# Patient Record
Sex: Male | Born: 1985 | Race: White | Hispanic: No | Marital: Single | Smoking: Current some day smoker
Health system: Southern US, Community
[De-identification: ages and names within clinical notes are randomized; demographics above are authoritative.]

## PROBLEM LIST (undated history)

## (undated) DIAGNOSIS — F209 Schizophrenia, unspecified: Secondary | ICD-10-CM

## (undated) DIAGNOSIS — F39 Unspecified mood [affective] disorder: Secondary | ICD-10-CM

## (undated) DIAGNOSIS — F1910 Other psychoactive substance abuse, uncomplicated: Secondary | ICD-10-CM

## (undated) DIAGNOSIS — J45909 Unspecified asthma, uncomplicated: Secondary | ICD-10-CM

## (undated) HISTORY — PX: ANKLE SURGERY: SHX546

## (undated) HISTORY — PX: JOINT REPLACEMENT: SHX530

## (undated) HISTORY — PX: FRACTURE SURGERY: SHX138

---
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
## 2006-08-09 ENCOUNTER — Emergency Department (HOSPITAL_COMMUNITY): Admission: EM | Admit: 2006-08-09 | Discharge: 2006-08-09 | Payer: Self-pay | Admitting: Emergency Medicine

## 2007-01-23 ENCOUNTER — Emergency Department (HOSPITAL_COMMUNITY): Admission: EM | Admit: 2007-01-23 | Discharge: 2007-01-23 | Payer: Self-pay | Admitting: Emergency Medicine

## 2007-01-25 ENCOUNTER — Emergency Department (HOSPITAL_COMMUNITY): Admission: EM | Admit: 2007-01-25 | Discharge: 2007-01-25 | Payer: Self-pay | Admitting: Emergency Medicine

## 2007-01-29 ENCOUNTER — Emergency Department (HOSPITAL_COMMUNITY): Admission: EM | Admit: 2007-01-29 | Discharge: 2007-01-29 | Payer: Self-pay | Admitting: Emergency Medicine

## 2007-04-11 ENCOUNTER — Emergency Department (HOSPITAL_COMMUNITY): Admission: EM | Admit: 2007-04-11 | Discharge: 2007-04-11 | Payer: Self-pay | Admitting: Emergency Medicine

## 2007-04-14 ENCOUNTER — Emergency Department (HOSPITAL_COMMUNITY): Admission: EM | Admit: 2007-04-14 | Discharge: 2007-04-14 | Payer: Self-pay | Admitting: Emergency Medicine

## 2007-05-23 ENCOUNTER — Emergency Department (HOSPITAL_COMMUNITY): Admission: EM | Admit: 2007-05-23 | Discharge: 2007-05-23 | Payer: Self-pay | Admitting: *Deleted

## 2007-05-24 ENCOUNTER — Emergency Department (HOSPITAL_COMMUNITY): Admission: EM | Admit: 2007-05-24 | Discharge: 2007-05-24 | Payer: Self-pay | Admitting: Emergency Medicine

## 2007-11-03 ENCOUNTER — Emergency Department (HOSPITAL_COMMUNITY): Admission: EM | Admit: 2007-11-03 | Discharge: 2007-11-03 | Payer: Self-pay | Admitting: Emergency Medicine

## 2010-05-03 ENCOUNTER — Emergency Department (HOSPITAL_COMMUNITY): Admission: EM | Admit: 2010-05-03 | Discharge: 2010-05-03 | Payer: Self-pay | Admitting: Emergency Medicine

## 2010-06-30 ENCOUNTER — Emergency Department (HOSPITAL_COMMUNITY): Admission: EM | Admit: 2010-06-30 | Discharge: 2010-06-30 | Payer: Self-pay | Admitting: Emergency Medicine

## 2010-12-16 ENCOUNTER — Inpatient Hospital Stay (HOSPITAL_COMMUNITY): Admission: EM | Admit: 2010-12-16 | Discharge: 2010-12-24 | Payer: Self-pay | Source: Home / Self Care

## 2010-12-19 LAB — ABO/RH: ABO/RH(D): A POS

## 2010-12-19 LAB — CBC
HCT: 43.4 % (ref 39.0–52.0)
HCT: 23.3 % — ABNORMAL LOW (ref 39.0–52.0)
HCT: 19 % — ABNORMAL LOW (ref 39.0–52.0)
HCT: 24.4 % — ABNORMAL LOW (ref 39.0–52.0)
HCT: 19.9 % — ABNORMAL LOW (ref 39.0–52.0)
Hemoglobin: 15 g/dL (ref 13.0–17.0)
Hemoglobin: 7.9 g/dL — ABNORMAL LOW (ref 13.0–17.0)
Hemoglobin: 6.4 g/dL — CL (ref 13.0–17.0)
Hemoglobin: 8.5 g/dL — ABNORMAL LOW (ref 13.0–17.0)
Hemoglobin: 6.8 g/dL — CL (ref 13.0–17.0)
MCH: 31.8 pg (ref 26.0–34.0)
MCH: 31.1 pg (ref 26.0–34.0)
MCH: 31.1 pg (ref 26.0–34.0)
MCH: 30.9 pg (ref 26.0–34.0)
MCH: 30.5 pg (ref 26.0–34.0)
MCHC: 34.6 g/dL (ref 30.0–36.0)
MCHC: 33.9 g/dL (ref 30.0–36.0)
MCHC: 33.7 g/dL (ref 30.0–36.0)
MCHC: 34.8 g/dL (ref 30.0–36.0)
MCHC: 34.2 g/dL (ref 30.0–36.0)
MCV: 91.9 fL (ref 78.0–100.0)
MCV: 91.7 fL (ref 78.0–100.0)
MCV: 92.2 fL (ref 78.0–100.0)
MCV: 88.7 fL (ref 78.0–100.0)
MCV: 89.2 fL (ref 78.0–100.0)
Platelets: 322 10*3/uL (ref 150–400)
Platelets: 252 10*3/uL (ref 150–400)
Platelets: 177 10*3/uL (ref 150–400)
Platelets: 144 10*3/uL — ABNORMAL LOW (ref 150–400)
Platelets: 135 10*3/uL — ABNORMAL LOW (ref 150–400)
RBC: 4.72 MIL/uL (ref 4.22–5.81)
RBC: 2.54 MIL/uL — ABNORMAL LOW (ref 4.22–5.81)
RBC: 2.06 MIL/uL — ABNORMAL LOW (ref 4.22–5.81)
RBC: 2.75 MIL/uL — ABNORMAL LOW (ref 4.22–5.81)
RBC: 2.23 MIL/uL — ABNORMAL LOW (ref 4.22–5.81)
RDW: 12.2 % (ref 11.5–15.5)
RDW: 12.4 % (ref 11.5–15.5)
RDW: 12.3 % (ref 11.5–15.5)
RDW: 13.7 % (ref 11.5–15.5)
RDW: 13.8 % (ref 11.5–15.5)
WBC: 13.9 10*3/uL — ABNORMAL HIGH (ref 4.0–10.5)
WBC: 15.8 10*3/uL — ABNORMAL HIGH (ref 4.0–10.5)
WBC: 15.2 10*3/uL — ABNORMAL HIGH (ref 4.0–10.5)
WBC: 15 10*3/uL — ABNORMAL HIGH (ref 4.0–10.5)
WBC: 12.7 10*3/uL — ABNORMAL HIGH (ref 4.0–10.5)

## 2010-12-19 LAB — COMPREHENSIVE METABOLIC PANEL
ALT: 259 U/L — ABNORMAL HIGH (ref 0–53)
ALT: 70 U/L — ABNORMAL HIGH (ref 0–53)
AST: 231 U/L — ABNORMAL HIGH (ref 0–37)
AST: 60 U/L — ABNORMAL HIGH (ref 0–37)
Albumin: 4.3 g/dL (ref 3.5–5.2)
Albumin: 2.6 g/dL — ABNORMAL LOW (ref 3.5–5.2)
Alkaline Phosphatase: 102 U/L (ref 39–117)
Alkaline Phosphatase: 60 U/L (ref 39–117)
BUN: 12 mg/dL (ref 6–23)
BUN: 3 mg/dL — ABNORMAL LOW (ref 6–23)
CO2: 22 mEq/L (ref 19–32)
CO2: 27 mEq/L (ref 19–32)
Calcium: 9.1 mg/dL (ref 8.4–10.5)
Calcium: 7.6 mg/dL — ABNORMAL LOW (ref 8.4–10.5)
Chloride: 104 mEq/L (ref 96–112)
Chloride: 100 mEq/L (ref 96–112)
Creatinine, Ser: 0.93 mg/dL (ref 0.4–1.5)
Creatinine, Ser: 0.67 mg/dL (ref 0.4–1.5)
GFR calc Af Amer: >60 mL/min (ref >60)
GFR calc Af Amer: >60 mL/min (ref >60)
GFR calc non Af Amer: >60 mL/min (ref >60)
GFR calc non Af Amer: >60 mL/min (ref >60)
Glucose, Bld: 126 mg/dL — ABNORMAL HIGH (ref 70–99)
Glucose, Bld: 111 mg/dL — ABNORMAL HIGH (ref 70–99)
Potassium: 4.2 mEq/L (ref 3.5–5.1)
Potassium: 3.7 mEq/L (ref 3.5–5.1)
Sodium: 136 mEq/L (ref 135–145)
Sodium: 132 mEq/L — ABNORMAL LOW (ref 135–145)
Total Bilirubin: 0.7 mg/dL (ref 0.3–1.2)
Total Bilirubin: 1.7 mg/dL — ABNORMAL HIGH (ref 0.3–1.2)
Total Protein: 7.1 g/dL (ref 6.0–8.3)
Total Protein: 4.6 g/dL — ABNORMAL LOW (ref 6.0–8.3)

## 2010-12-19 LAB — GLUCOSE, CAPILLARY
Glucose-Capillary: 148 mg/dL — ABNORMAL HIGH (ref 70–99)
Glucose-Capillary: 170 mg/dL — ABNORMAL HIGH (ref 70–99)
Glucose-Capillary: 124 mg/dL — ABNORMAL HIGH (ref 70–99)
Glucose-Capillary: 119 mg/dL — ABNORMAL HIGH (ref 70–99)
Glucose-Capillary: 121 mg/dL — ABNORMAL HIGH (ref 70–99)
Glucose-Capillary: 119 mg/dL — ABNORMAL HIGH (ref 70–99)
Glucose-Capillary: 125 mg/dL — ABNORMAL HIGH (ref 70–99)
Glucose-Capillary: 122 mg/dL — ABNORMAL HIGH (ref 70–99)
Glucose-Capillary: 112 mg/dL — ABNORMAL HIGH (ref 70–99)

## 2010-12-19 LAB — TYPE AND SCREEN
ABO/RH(D): A POS
Antibody Screen: NEGATIVE
Unit division: 0
Unit division: 0

## 2010-12-19 LAB — BASIC METABOLIC PANEL
BUN: 7 mg/dL (ref 6–23)
CO2: 22 mEq/L (ref 19–32)
Calcium: 7.4 mg/dL — ABNORMAL LOW (ref 8.4–10.5)
Chloride: 104 mEq/L (ref 96–112)
Creatinine, Ser: 0.87 mg/dL (ref 0.4–1.5)
GFR calc Af Amer: >60 mL/min (ref >60)
GFR calc non Af Amer: >60 mL/min (ref >60)
Glucose, Bld: 154 mg/dL — ABNORMAL HIGH (ref 70–99)
Potassium: 4 mEq/L (ref 3.5–5.1)
Sodium: 134 mEq/L — ABNORMAL LOW (ref 135–145)

## 2010-12-19 LAB — DIFFERENTIAL
Basophils Absolute: 0 10*3/uL (ref 0.0–0.1)
Basophils Relative: 0 % (ref 0–1)
Eosinophils Absolute: 0.2 10*3/uL (ref 0.0–0.7)
Eosinophils Relative: 1 % (ref 0–5)
Lymphocytes Relative: 26 % (ref 12–46)
Lymphs Abs: 3.6 10*3/uL (ref 0.7–4.0)
Monocytes Absolute: 0.7 10*3/uL (ref 0.1–1.0)
Monocytes Relative: 5 % (ref 3–12)
Neutro Abs: 9.4 10*3/uL — ABNORMAL HIGH (ref 1.7–7.7)
Neutrophils Relative %: 68 % (ref 43–77)

## 2010-12-19 LAB — HEMOGLOBIN AND HEMATOCRIT, BLOOD
HCT: 25.9 % — ABNORMAL LOW (ref 39.0–52.0)
Hemoglobin: 8.6 g/dL — ABNORMAL LOW (ref 13.0–17.0)

## 2010-12-19 LAB — ETHANOL: Alcohol, Ethyl (B): 5 mg/dL (ref 0–10)

## 2010-12-19 LAB — POCT I-STAT 4, (NA,K, GLUC, HGB,HCT)
Glucose, Bld: 126 mg/dL — ABNORMAL HIGH (ref 70–99)
HCT: 29 % — ABNORMAL LOW (ref 39.0–52.0)
Hemoglobin: 9.9 g/dL — ABNORMAL LOW (ref 13.0–17.0)
Potassium: 4.6 mEq/L (ref 3.5–5.1)
Sodium: 138 mEq/L (ref 135–145)

## 2010-12-19 LAB — PREPARE RBC (CROSSMATCH)

## 2010-12-19 LAB — RAPID URINE DRUG SCREEN, HOSP PERFORMED
Amphetamines: NOT DETECTED
Barbiturates: NOT DETECTED
Benzodiazepines: POSITIVE — AB
Cocaine: NOT DETECTED
Opiates: POSITIVE — AB
Tetrahydrocannabinol: POSITIVE — AB

## 2010-12-19 LAB — MRSA PCR SCREENING: MRSA by PCR: NEGATIVE

## 2010-12-21 LAB — CBC
HCT: 24.7 % — ABNORMAL LOW (ref 39.0–52.0)
HCT: 23.4 % — ABNORMAL LOW (ref 39.0–52.0)
HCT: 22.4 % — ABNORMAL LOW (ref 39.0–52.0)
Hemoglobin: 8.7 g/dL — ABNORMAL LOW (ref 13.0–17.0)
Hemoglobin: 8.2 g/dL — ABNORMAL LOW (ref 13.0–17.0)
Hemoglobin: 7.7 g/dL — ABNORMAL LOW (ref 13.0–17.0)
MCH: 30.9 pg (ref 26.0–34.0)
MCH: 30.6 pg (ref 26.0–34.0)
MCH: 30.7 pg (ref 26.0–34.0)
MCHC: 35.2 g/dL (ref 30.0–36.0)
MCHC: 35 g/dL (ref 30.0–36.0)
MCHC: 34.4 g/dL (ref 30.0–36.0)
MCV: 87.6 fL (ref 78.0–100.0)
MCV: 87.3 fL (ref 78.0–100.0)
MCV: 89.2 fL (ref 78.0–100.0)
Platelets: 123 10*3/uL — ABNORMAL LOW (ref 150–400)
Platelets: 121 10*3/uL — ABNORMAL LOW (ref 150–400)
Platelets: 153 10*3/uL (ref 150–400)
RBC: 2.82 MIL/uL — ABNORMAL LOW (ref 4.22–5.81)
RBC: 2.68 MIL/uL — ABNORMAL LOW (ref 4.22–5.81)
RBC: 2.51 MIL/uL — ABNORMAL LOW (ref 4.22–5.81)
RDW: 13.7 % (ref 11.5–15.5)
RDW: 13.6 % (ref 11.5–15.5)
RDW: 13.6 % (ref 11.5–15.5)
WBC: 12.2 10*3/uL — ABNORMAL HIGH (ref 4.0–10.5)
WBC: 9.5 10*3/uL (ref 4.0–10.5)
WBC: 8.3 10*3/uL (ref 4.0–10.5)

## 2010-12-21 LAB — GLUCOSE, CAPILLARY
Glucose-Capillary: 134 mg/dL — ABNORMAL HIGH (ref 70–99)
Glucose-Capillary: 125 mg/dL — ABNORMAL HIGH (ref 70–99)
Glucose-Capillary: 108 mg/dL — ABNORMAL HIGH (ref 70–99)
Glucose-Capillary: 122 mg/dL — ABNORMAL HIGH (ref 70–99)
Glucose-Capillary: 125 mg/dL — ABNORMAL HIGH (ref 70–99)
Glucose-Capillary: 103 mg/dL — ABNORMAL HIGH (ref 70–99)

## 2010-12-21 LAB — COMPREHENSIVE METABOLIC PANEL
ALT: 36 U/L (ref 0–53)
AST: 43 U/L — ABNORMAL HIGH (ref 0–37)
Albumin: 2.2 g/dL — ABNORMAL LOW (ref 3.5–5.2)
Alkaline Phosphatase: 67 U/L (ref 39–117)
BUN: 2 mg/dL — ABNORMAL LOW (ref 6–23)
CO2: 27 mEq/L (ref 19–32)
Calcium: 7.5 mg/dL — ABNORMAL LOW (ref 8.4–10.5)
Chloride: 105 mEq/L (ref 96–112)
Creatinine, Ser: 0.61 mg/dL (ref 0.4–1.5)
GFR calc Af Amer: >60 mL/min (ref >60)
GFR calc non Af Amer: >60 mL/min (ref >60)
Glucose, Bld: 115 mg/dL — ABNORMAL HIGH (ref 70–99)
Potassium: 3.3 mEq/L — ABNORMAL LOW (ref 3.5–5.1)
Sodium: 137 mEq/L (ref 135–145)
Total Bilirubin: 1.1 mg/dL (ref 0.3–1.2)
Total Protein: 4.9 g/dL — ABNORMAL LOW (ref 6.0–8.3)

## 2010-12-21 LAB — BASIC METABOLIC PANEL
BUN: 2 mg/dL — ABNORMAL LOW (ref 6–23)
CO2: 27 mEq/L (ref 19–32)
Calcium: 7.6 mg/dL — ABNORMAL LOW (ref 8.4–10.5)
Chloride: 98 mEq/L (ref 96–112)
Creatinine, Ser: 0.64 mg/dL (ref 0.4–1.5)
GFR calc Af Amer: >60 mL/min (ref >60)
GFR calc non Af Amer: >60 mL/min (ref >60)
Glucose, Bld: 129 mg/dL — ABNORMAL HIGH (ref 70–99)
Potassium: 3 mEq/L — ABNORMAL LOW (ref 3.5–5.1)
Sodium: 133 mEq/L — ABNORMAL LOW (ref 135–145)

## 2010-12-21 LAB — PROTIME-INR
INR: 1.24 (ref 0.00–1.49)
Prothrombin Time: 15.8 seconds — ABNORMAL HIGH (ref 11.6–15.2)
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
## 2010-12-26 LAB — BASIC METABOLIC PANEL
BUN: 4 mg/dL — ABNORMAL LOW (ref 6–23)
CO2: 26 mEq/L (ref 19–32)
Calcium: 8.5 mg/dL (ref 8.4–10.5)
GFR calc Af Amer: >60 mL/min (ref >60)
GFR calc non Af Amer: >60 mL/min (ref >60)
GFR calc non Af Amer: >60 mL/min (ref >60)
Glucose, Bld: 119 mg/dL — ABNORMAL HIGH (ref 70–99)
Glucose, Bld: 120 mg/dL — ABNORMAL HIGH (ref 70–99)
Potassium: 3.4 mEq/L — ABNORMAL LOW (ref 3.5–5.1)
Sodium: 139 mEq/L (ref 135–145)
Sodium: 136 mEq/L (ref 135–145)

## 2010-12-26 LAB — TYPE AND SCREEN
ABO/RH(D): A POS
Antibody Screen: NEGATIVE
Unit division: 0
Unit division: 0
Unit division: 0
Unit division: 0

## 2010-12-26 LAB — CBC
HCT: 25.4 % — ABNORMAL LOW (ref 39.0–52.0)
HCT: 27 % — ABNORMAL LOW (ref 39.0–52.0)
HCT: 28 % — ABNORMAL LOW (ref 39.0–52.0)
Hemoglobin: 9 g/dL — ABNORMAL LOW (ref 13.0–17.0)
Hemoglobin: 9.4 g/dL — ABNORMAL LOW (ref 13.0–17.0)
MCH: 30.1 pg (ref 26.0–34.0)
MCHC: 33.9 g/dL (ref 30.0–36.0)
MCHC: 33.3 g/dL (ref 30.0–36.0)
MCHC: 33.6 g/dL (ref 30.0–36.0)
MCV: 88.5 fL (ref 78.0–100.0)
MCV: 89.1 fL (ref 78.0–100.0)
Platelets: 185 10*3/uL (ref 150–400)
RDW: 13.6 % (ref 11.5–15.5)
RDW: 13.1 % (ref 11.5–15.5)
RDW: 12.9 % (ref 11.5–15.5)

## 2010-12-26 LAB — TISSUE CULTURE: Culture: NO GROWTH

## 2010-12-26 LAB — GLUCOSE, CAPILLARY: Glucose-Capillary: 110 mg/dL — ABNORMAL HIGH (ref 70–99)

## 2010-12-27 LAB — CBC
HCT: 29.6 % — ABNORMAL LOW (ref 39.0–52.0)
Hemoglobin: 9.7 g/dL — ABNORMAL LOW (ref 13.0–17.0)
MCH: 29.3 pg (ref 26.0–34.0)
MCHC: 32.8 g/dL (ref 30.0–36.0)
RDW: 12.7 % (ref 11.5–15.5)

## 2010-12-27 LAB — ANAEROBIC CULTURE
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
## 2011-02-10 ENCOUNTER — Ambulatory Visit (HOSPITAL_COMMUNITY)
Admission: RE | Admit: 2011-02-10 | Discharge: 2011-02-10 | Disposition: A | Discharge status: Home / Self Care | Payer: Medicaid Other | Source: Ambulatory Visit | Attending: Orthopedic Surgery | Admitting: Orthopedic Surgery

## 2011-02-10 ENCOUNTER — Ambulatory Visit (HOSPITAL_COMMUNITY): Payer: Medicaid Other

## 2011-02-10 DIAGNOSIS — F319 Bipolar disorder, unspecified: Secondary | ICD-10-CM | POA: Insufficient documentation

## 2011-02-10 DIAGNOSIS — IMO0002 Reserved for concepts with insufficient information to code with codable children: Secondary | ICD-10-CM | POA: Insufficient documentation

## 2011-02-10 LAB — SURGICAL PCR SCREEN
MRSA, PCR: NEGATIVE
Staphylococcus aureus: NEGATIVE

## 2011-02-10 LAB — TYPE AND SCREEN
ABO/RH(D): A POS
Antibody Screen: NEGATIVE

## 2011-02-10 LAB — URINALYSIS, ROUTINE W REFLEX MICROSCOPIC
Bilirubin Urine: NEGATIVE
Ketones, ur: NEGATIVE mg/dL
Nitrite: NEGATIVE
Protein, ur: NEGATIVE mg/dL

## 2011-02-10 LAB — COMPREHENSIVE METABOLIC PANEL
AST: 19 U/L (ref 0–37)
Alkaline Phosphatase: 153 U/L — ABNORMAL HIGH (ref 39–117)
BUN: 10 mg/dL (ref 6–23)
CO2: 26 mEq/L (ref 19–32)
Chloride: 102 mEq/L (ref 96–112)
Creatinine, Ser: 0.61 mg/dL (ref 0.4–1.5)
GFR calc non Af Amer: >60 mL/min (ref >60)
Potassium: 3.3 mEq/L — ABNORMAL LOW (ref 3.5–5.1)
Total Bilirubin: 0.2 mg/dL — ABNORMAL LOW (ref 0.3–1.2)

## 2011-02-10 LAB — DIFFERENTIAL
Basophils Relative: 0 % (ref 0–1)
Eosinophils Absolute: 0.2 10*3/uL (ref 0.0–0.7)
Eosinophils Relative: 2 % (ref 0–5)
Monocytes Absolute: 0.6 10*3/uL (ref 0.1–1.0)
Monocytes Relative: 7 % (ref 3–12)
Neutrophils Relative %: 68 % (ref 43–77)

## 2011-02-10 LAB — CBC
MCH: 28.7 pg (ref 26.0–34.0)
MCHC: 33.3 g/dL (ref 30.0–36.0)
Platelets: 321 10*3/uL (ref 150–400)
RDW: 12.4 % (ref 11.5–15.5)

## 2011-02-11 LAB — URINE CULTURE: Culture  Setup Time: 201203091009

## 2011-02-16 NOTE — Op Note (Signed)
NAME:  Jason Buchanan, Jason Buchanan               ACCOUNT NO.:  [id number]  MEDICAL RECORD NO.:  [id number]           PATIENT TYPE:  O  LOCATION:  SDSC                         FACILITY:  [facility]  PHYSICIAN:  Doralee Albino. Carola Frost, M.D. DATE OF BIRTH:  October 23, 1986  DATE OF PROCEDURE:  02/10/2011 DATE OF DISCHARGE:  02/10/2011                              OPERATIVE REPORT   PREOPERATIVE DIAGNOSES: 1. Right tibia nonunion. 2. Retained external fixator.  POSTOPERATIVE DIAGNOSES: 1. Right tibia nonunion. 2. Retained external fixator.  PROCEDURES: 1. Removal of right ankle spanning external fixator under anesthesia. 2. Curettage and debridement of partial excision of pin tracks, right     tibia and calcaneus. 3. Stress fluoroscopy.  SURGEON:  Yurem Viner. Carola Frost, MD  ASSISTANT:  Mearl Latin, PA  ANESTHESIA:  General.  COMPLICATIONS:  None.  TOURNIQUET:  None.  ESTIMATED BLOOD LOSS:  30 mL.  DISPOSITION:  To PACU.  CONDITION:  Stable.  BRIEF SUMMARY AND INDICATION FOR PROCEDURE:  [name] is a 25 year old male status post open right distal tibia fracture as well as an ipsilateral bicondylar tibial plateau fracture which was treated with ORIF.  The patient has been in a fixator for an extended period of time awaiting soft tissue swelling resolution as well as in the hope this could provide definitive fixation and go on to unite.  The patient now presents for elective removal with stress fluoro, examination, and curettage of his pin sites in case subsequent plating is required.  I discussed with him the risks and benefits of surgery including possibility of infection, nerve injury, vessel injury, need for further surgery, DVT, PE, heart attack, stroke, and multiple others.  After full discussion, the patient wished to proceed.  BRIEF DESCRIPTION OF PROCEDURE:  Mr. Goynes was administered preop antibiotics and taken to the operating room where general anesthesia was induced to his right  lower extremity.  He then underwent removal of the spanning fixator.  There was some subcutaneous and a full-thickness skin necrotic tissue that required debridement adjacent to the pins.  The calcaneal pin was somewhat loose, but not excessively so.  Tibial pins were well fixed.  Following removal, the standard prep and drape was performed of the right lower extremity and then aggressive curettage of the pin sites performed debriding bone along the tract of the pin in the medullary canal touching the far cortex, the near cortex, subcu tissue, and skin.  C-arm was then brought in the ankle stress demonstrating some slight motion at the fracture site consistent with nonunion.  Sterile gently compressive dressing was applied.  The patient was then wakened from anesthesia and transported to the PACU in stable condition.  Montez Morita, PA-C, assisted me throughout the procedure.  He was placed in a posterior and stirrup splint in addition a sterile dressing.  He will go to CT Scans from the PACU to help [place] quantify and better delineate the extent of his nonunion and for operative planning.  I would anticipate him returning to the OR after his pin sites have healed for plating and grafting.     Doralee Albino. Carola Frost, M.D.  MHH/MEDQ  D:  02/10/2011  T:  02/11/2011  Job:  [id number]  Electronically Signed by Myrene Galas M.D. on 02/16/2011 10:25:08 AM

## 2011-03-06 ENCOUNTER — Emergency Department (HOSPITAL_COMMUNITY): Payer: Medicaid Other

## 2011-03-06 ENCOUNTER — Emergency Department (HOSPITAL_COMMUNITY)
Admission: EM | Admit: 2011-03-06 | Discharge: 2011-03-06 | Disposition: A | Discharge status: Home / Self Care | Payer: Medicaid Other | Attending: Emergency Medicine | Admitting: Emergency Medicine

## 2011-03-06 DIAGNOSIS — G8918 Other acute postprocedural pain: Secondary | ICD-10-CM | POA: Insufficient documentation

## 2011-03-06 DIAGNOSIS — M79609 Pain in unspecified limb: Secondary | ICD-10-CM | POA: Insufficient documentation

## 2011-03-06 DIAGNOSIS — Z79899 Other long term (current) drug therapy: Secondary | ICD-10-CM | POA: Insufficient documentation

## 2011-03-06 DIAGNOSIS — J45909 Unspecified asthma, uncomplicated: Secondary | ICD-10-CM | POA: Insufficient documentation

## 2011-03-06 DIAGNOSIS — M25569 Pain in unspecified knee: Secondary | ICD-10-CM | POA: Insufficient documentation

## 2011-03-06 DIAGNOSIS — Z7901 Long term (current) use of anticoagulants: Secondary | ICD-10-CM | POA: Insufficient documentation

## 2011-03-08 ENCOUNTER — Other Ambulatory Visit (HOSPITAL_COMMUNITY): Payer: Self-pay | Admitting: Orthopedic Surgery

## 2011-03-08 ENCOUNTER — Ambulatory Visit (HOSPITAL_COMMUNITY)
Admission: RE | Admit: 2011-03-08 | Discharge: 2011-03-08 | Disposition: A | Discharge status: Home / Self Care | Payer: Medicaid Other | Source: Ambulatory Visit | Attending: Orthopedic Surgery | Admitting: Orthopedic Surgery

## 2011-03-08 ENCOUNTER — Encounter (HOSPITAL_COMMUNITY)
Admission: RE | Admit: 2011-03-08 | Discharge: 2011-03-08 | Disposition: A | Discharge status: Home / Self Care | Payer: Medicaid Other | Source: Ambulatory Visit | Attending: Orthopedic Surgery | Admitting: Orthopedic Surgery

## 2011-03-08 DIAGNOSIS — S8291XA Unspecified fracture of right lower leg, initial encounter for closed fracture: Secondary | ICD-10-CM

## 2011-03-08 DIAGNOSIS — Z01812 Encounter for preprocedural laboratory examination: Secondary | ICD-10-CM | POA: Insufficient documentation

## 2011-03-08 DIAGNOSIS — Z01818 Encounter for other preprocedural examination: Secondary | ICD-10-CM | POA: Insufficient documentation

## 2011-03-08 DIAGNOSIS — X58XXXA Exposure to other specified factors, initial encounter: Secondary | ICD-10-CM | POA: Insufficient documentation

## 2011-03-08 DIAGNOSIS — Z01810 Encounter for preprocedural cardiovascular examination: Secondary | ICD-10-CM | POA: Insufficient documentation

## 2011-03-08 DIAGNOSIS — S82109A Unspecified fracture of upper end of unspecified tibia, initial encounter for closed fracture: Secondary | ICD-10-CM | POA: Insufficient documentation

## 2011-03-08 LAB — PROTIME-INR
INR: 1.01 (ref 0.00–1.49)
Prothrombin Time: 13.5 seconds (ref 11.6–15.2)

## 2011-03-08 LAB — SURGICAL PCR SCREEN: Staphylococcus aureus: NEGATIVE

## 2011-03-08 LAB — GAMMA GT: GGT: 41 U/L (ref 7–51)

## 2011-03-08 LAB — URINALYSIS, ROUTINE W REFLEX MICROSCOPIC
Hgb urine dipstick: NEGATIVE
Specific Gravity, Urine: 1.021 (ref 1.005–1.030)
Urobilinogen, UA: 0.2 mg/dL (ref 0.0–1.0)
pH: 7.5 (ref 5.0–8.0)

## 2011-03-08 LAB — CBC
HCT: 42.8 % (ref 39.0–52.0)
Hemoglobin: 14.8 g/dL (ref 13.0–17.0)
RBC: 5.07 MIL/uL (ref 4.22–5.81)
RDW: 13.2 % (ref 11.5–15.5)
WBC: 7.7 10*3/uL (ref 4.0–10.5)

## 2011-03-08 LAB — COMPREHENSIVE METABOLIC PANEL
ALT: 22 U/L (ref 0–53)
BUN: 9 mg/dL (ref 6–23)
Calcium: 10.3 mg/dL (ref 8.4–10.5)
Glucose, Bld: 105 mg/dL — ABNORMAL HIGH (ref 70–99)
Sodium: 138 mEq/L (ref 135–145)
Total Protein: 7.5 g/dL (ref 6.0–8.3)

## 2011-03-08 LAB — SEDIMENTATION RATE: Sed Rate: 0 mm/hr (ref 0–16)

## 2011-03-09 ENCOUNTER — Inpatient Hospital Stay (HOSPITAL_COMMUNITY)
Admission: RE | Admit: 2011-03-09 | Discharge: 2011-03-10 | DRG: 494 | Disposition: A | Discharge status: Home / Self Care | Payer: Medicaid Other | Source: Ambulatory Visit | Attending: Orthopedic Surgery | Admitting: Orthopedic Surgery

## 2011-03-09 ENCOUNTER — Inpatient Hospital Stay (HOSPITAL_COMMUNITY): Payer: Medicaid Other

## 2011-03-09 DIAGNOSIS — M4120 Other idiopathic scoliosis, site unspecified: Secondary | ICD-10-CM | POA: Diagnosis present

## 2011-03-09 DIAGNOSIS — J45909 Unspecified asthma, uncomplicated: Secondary | ICD-10-CM | POA: Diagnosis present

## 2011-03-09 DIAGNOSIS — F17200 Nicotine dependence, unspecified, uncomplicated: Secondary | ICD-10-CM | POA: Diagnosis present

## 2011-03-09 DIAGNOSIS — F319 Bipolar disorder, unspecified: Secondary | ICD-10-CM | POA: Diagnosis present

## 2011-03-09 DIAGNOSIS — IMO0002 Reserved for concepts with insufficient information to code with codable children: Principal | ICD-10-CM | POA: Diagnosis present

## 2011-03-09 LAB — URINE CULTURE
Colony Count: NO GROWTH
Culture  Setup Time: 201204041622

## 2011-03-09 LAB — C-REACTIVE PROTEIN: CRP: 0.1 mg/dL — ABNORMAL LOW (ref <0.6)

## 2011-03-10 ENCOUNTER — Inpatient Hospital Stay (HOSPITAL_COMMUNITY): Payer: Medicaid Other

## 2011-03-12 LAB — TISSUE CULTURE

## 2011-03-12 NOTE — Op Note (Signed)
NAME:  Jason Buchanan, Jason Buchanan               ACCOUNT NO.:  [id number]  MEDICAL RECORD NO.:  [id number]           PATIENT TYPE:  I  LOCATION:  5034                         FACILITY:  [facility]  PHYSICIAN:  Doralee Albino. Carola Frost, M.D. DATE OF BIRTH:  Dec 23, 1985  DATE OF PROCEDURE:  03/09/2011 DATE OF DISCHARGE:                              OPERATIVE REPORT   PREOPERATIVE DIAGNOSIS:  Right tibia nonunion.  POSTOPERATIVE DIAGNOSIS:  Right tibia nonunion.  PROCEDURE: 1. Open reduction and internal fixation and repair of right tibial     nonunion. 2. Infuse allografting. 3. Removal of deep implant, right tibia.  SURGEON:  Nolin Grell. Carola Frost, MD  ASSISTANT:  Mearl Latin, PA  ANESTHESIA:  General.  COMPLICATIONS:  None.  BLOOD LOSS:  20 mL.  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  SPECIMENS:  One fibrous nonunion.  DISPOSITION:  To micro.  TOURNIQUET:  None.  DISPOSITION:  To PACU.  CONDITION:  Stable.  BRIEF SUMMARY AND INDICATION FOR PROCEDURE:  Jason Buchanan is a 25 year old male status post open right tibial pilon fracture with multiple other injuries.  The patient was treated for a prolonged period with external fixator and lag screw fixation at the articular level appearing to unite the articular fractures, but to maintain a symptomatic large metaphyseal nonunion.  There was a cortical defect posteriorly.  I discussed with him the risks and benefits of posterior approach and repair and allografting including the possibility of infection, nerve injury, vessel injury, persistent nonunion, symptomatic hardware, need for further surgery, DVT, PE, heart attack, stroke, and multiple others. After full discussion, he did wished to proceed.  BRIEF DESCRIPTION OF PROCEDURE:  Jason Buchanan was given preoperative antibiotics and taken to the operating room where general anesthesia was induced.  His right lower extremity was prepped and draped in  usual sterile fashion and then as the patient was prone with all prominences padded appropriately, a 9-cm incision was made just lateral to the calcaneus and then dissection carried down where the lesser saphenous nerve and vessels were identified and retracted for protection.  The fascia was incised and the Achilles retracted medially.  The deep fascia was then incised as well.  Dissection carried carefully down medial to the FHL and directly onto the bone.  Anastomoses including the peroneal and posterior tibial systems was cauterized and divided but it was quite small.  Dissection continued subperiosteally along the posterior tibia and the margins of the metaphyseal defect were identified and then excised in bulk using a 10 blade which produced a 3 cm x 2 cm x 2.5 cm deep fibrous nonunion.  This was sent to micro and cultures and there would be no organisms found.  The margins of the defect were curetted out, irrigation and lavage were performed.  The defect was then filled with a combination of infuse about 60 mL of allograft chips and with multiple interspersed layers of infuse and then infuse over the periosteal layer.  I did leave the periosteum intact along the articular segment distal to the metaphyseal defect.  The posterior tibial  plate was then placed provisionally with K-wires.  I did feel like that one of the screws was prominent anteriorly and so the knee was flexed.  A small incision made and an anterior-posterior lag screw removed through a separate anterolateral incision carefully looking through the superficial peroneal nerve and retracting.  The plate was then secured with standard fixation into the articular block as well as the shaft and this was followed by additional standard screws proximally.  Because of the patient's profound osteopenia, I did place locked fixation in the subchondral segment.  Final x-rays showed appropriate reduction, filling of the void and  screw trajectory length.  Wound was irrigated and closed in standard layered fashion with 2-0 Vicryl and 3-0 nylon.  Sterile gently compressive dressing was applied and a posterior stirrup splint. The patient was wakened from anesthesia and transported to the PACU in stable condition.  Montez Morita, PA-C assisted me throughout the procedure and was absolutely necessary for safe and effective completion  of the case to protect the posterior tibial neurovascular bundles, peroneal artery and to retract the deep soft tissues and muscle layers.  He also assisted me with placement and removal of provisional fixation.  PROGNOSIS:  Jason Buchanan will be nonweightbearing on the right lower extremity for the next 6 weeks with graduated weightbearing thereafter. We will follow the cultures to make sure there is no occult infection and he will have unrestricted range of motion as soon as soft tissues allow him.  He will be on DVT prophylaxis Lovenox while in the hospital. May be an increased risk for persistent nonunion, but the vascularity appeared excellent within the bone.     Doralee Albino. Carola Frost, M.D.     MHH/MEDQ  D:  03/09/2011  T:  03/10/2011  Job:  [id number]  Electronically Signed by Myrene Galas M.D. on 03/12/2011 03:12:19 PM

## 2011-03-14 LAB — ANAEROBIC CULTURE
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
## 2011-05-14 ENCOUNTER — Emergency Department (HOSPITAL_COMMUNITY)
Admission: EM | Admit: 2011-05-14 | Discharge: 2011-05-14 | Disposition: A | Discharge status: Home / Self Care | Payer: Medicaid Other | Attending: Emergency Medicine | Admitting: Emergency Medicine

## 2011-05-14 ENCOUNTER — Emergency Department (HOSPITAL_COMMUNITY): Payer: Medicaid Other

## 2011-05-14 DIAGNOSIS — F411 Generalized anxiety disorder: Secondary | ICD-10-CM | POA: Insufficient documentation

## 2011-05-14 DIAGNOSIS — T22119A Burn of first degree of unspecified forearm, initial encounter: Secondary | ICD-10-CM | POA: Insufficient documentation

## 2011-05-14 DIAGNOSIS — S7000XA Contusion of unspecified hip, initial encounter: Secondary | ICD-10-CM | POA: Insufficient documentation

## 2011-05-14 DIAGNOSIS — Y929 Unspecified place or not applicable: Secondary | ICD-10-CM | POA: Insufficient documentation

## 2011-05-14 DIAGNOSIS — X118XXA Contact with other hot tap-water, initial encounter: Secondary | ICD-10-CM | POA: Insufficient documentation

## 2011-05-14 DIAGNOSIS — IMO0002 Reserved for concepts with insufficient information to code with codable children: Secondary | ICD-10-CM | POA: Insufficient documentation

## 2011-05-14 DIAGNOSIS — R296 Repeated falls: Secondary | ICD-10-CM | POA: Insufficient documentation

## 2011-08-19 ENCOUNTER — Emergency Department (HOSPITAL_COMMUNITY)
Admission: EM | Admit: 2011-08-19 | Discharge: 2011-08-19 | Disposition: A | Discharge status: Home / Self Care | Payer: Medicaid Other | Attending: Emergency Medicine | Admitting: Emergency Medicine

## 2011-08-19 ENCOUNTER — Emergency Department (HOSPITAL_COMMUNITY): Payer: Medicaid Other

## 2011-08-19 DIAGNOSIS — W108XXA Fall (on) (from) other stairs and steps, initial encounter: Secondary | ICD-10-CM | POA: Insufficient documentation

## 2011-08-19 DIAGNOSIS — M949 Disorder of cartilage, unspecified: Secondary | ICD-10-CM | POA: Insufficient documentation

## 2011-08-19 DIAGNOSIS — M25579 Pain in unspecified ankle and joints of unspecified foot: Secondary | ICD-10-CM | POA: Insufficient documentation

## 2011-08-19 DIAGNOSIS — S93409A Sprain of unspecified ligament of unspecified ankle, initial encounter: Secondary | ICD-10-CM | POA: Insufficient documentation

## 2011-08-19 DIAGNOSIS — M899 Disorder of bone, unspecified: Secondary | ICD-10-CM | POA: Insufficient documentation
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
## 2012-08-08 ENCOUNTER — Encounter (HOSPITAL_COMMUNITY): Payer: Self-pay

## 2012-08-08 ENCOUNTER — Emergency Department (HOSPITAL_COMMUNITY)
Admission: EM | Admit: 2012-08-08 | Discharge: 2012-08-08 | Disposition: A | Discharge status: Home / Self Care | Payer: Medicaid Other | Attending: Emergency Medicine | Admitting: Emergency Medicine

## 2012-08-08 DIAGNOSIS — R21 Rash and other nonspecific skin eruption: Secondary | ICD-10-CM

## 2012-08-08 DIAGNOSIS — F17200 Nicotine dependence, unspecified, uncomplicated: Secondary | ICD-10-CM | POA: Insufficient documentation

## 2012-08-08 MED ORDER — PREDNISONE 50 MG PO TABS: ORAL_TABLET | ORAL | Status: DC

## 2012-08-08 MED ORDER — LORAZEPAM 2 MG/ML IJ SOLN
1.0000 mg | Freq: Once | INTRAMUSCULAR | Status: AC
  Administered 2012-08-08: 1 mg via INTRAVENOUS
  Filled 2012-08-08: qty 1

## 2012-08-08 MED ORDER — FAMOTIDINE IN NACL 20-0.9 MG/50ML-% IV SOLN
20.0000 mg | Freq: Once | INTRAVENOUS | Status: AC
  Administered 2012-08-08: 20 mg via INTRAVENOUS
  Filled 2012-08-08: qty 50

## 2012-08-08 MED ORDER — HYDROXYZINE HCL 25 MG PO TABS
25.0000 mg | ORAL_TABLET | Freq: Once | ORAL | Status: AC
  Administered 2012-08-08: 25 mg via ORAL
  Filled 2012-08-08: qty 1

## 2012-08-08 MED ORDER — DIPHENHYDRAMINE HCL 50 MG/ML IJ SOLN
25.0000 mg | Freq: Once | INTRAMUSCULAR | Status: AC
  Administered 2012-08-08: 25 mg via INTRAVENOUS
  Filled 2012-08-08: qty 1

## 2012-08-08 MED ORDER — METHYLPREDNISOLONE SODIUM SUCC 125 MG IJ SOLR
125.0000 mg | Freq: Once | INTRAMUSCULAR | Status: AC
  Administered 2012-08-08: 125 mg via INTRAVENOUS
  Filled 2012-08-08: qty 2

## 2012-08-08 MED ORDER — HYDROMORPHONE HCL PF 1 MG/ML IJ SOLN
1.0000 mg | Freq: Once | INTRAMUSCULAR | Status: AC
  Administered 2012-08-08: 1 mg via INTRAVENOUS
  Filled 2012-08-08: qty 1

## 2012-08-08 MED ORDER — DIPHENHYDRAMINE HCL 25 MG PO TABS: 25.0000 mg | ORAL_TABLET | Freq: Four times a day (QID) | ORAL | Status: DC | PRN

## 2012-08-08 NOTE — ED Notes (Signed)
Pt was working in the yard on Tuesday and breaking into a rash red raised itchy on arms and legs
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
## 2012-08-08 NOTE — ED Provider Notes (Signed)
Medical screening examination/treatment/procedure(s) were conducted as a shared visit with non-physician practitioner(s) and myself.  I personally evaluated the patient during the encounter On my exam this male is in no distress with no respiratory compromise, nor evidence of systemic infection.  Gerhard Munch, MD 08/08/12 [phone]

## 2012-08-12 ENCOUNTER — Emergency Department (HOSPITAL_COMMUNITY)
Admission: EM | Admit: 2012-08-12 | Discharge: 2012-08-12 | Disposition: A | Discharge status: Home / Self Care | Payer: Medicaid Other | Attending: Emergency Medicine | Admitting: Emergency Medicine

## 2012-08-12 ENCOUNTER — Emergency Department (HOSPITAL_COMMUNITY): Payer: Medicaid Other

## 2012-08-12 ENCOUNTER — Encounter (HOSPITAL_COMMUNITY): Payer: Self-pay | Admitting: Family Medicine

## 2012-08-12 DIAGNOSIS — R079 Chest pain, unspecified: Secondary | ICD-10-CM | POA: Insufficient documentation

## 2012-08-12 DIAGNOSIS — W11XXXA Fall on and from ladder, initial encounter: Secondary | ICD-10-CM | POA: Insufficient documentation

## 2012-08-12 DIAGNOSIS — F17200 Nicotine dependence, unspecified, uncomplicated: Secondary | ICD-10-CM | POA: Insufficient documentation

## 2012-08-12 DIAGNOSIS — M79609 Pain in unspecified limb: Secondary | ICD-10-CM | POA: Insufficient documentation

## 2012-08-12 DIAGNOSIS — S0100XA Unspecified open wound of scalp, initial encounter: Secondary | ICD-10-CM | POA: Insufficient documentation

## 2012-08-12 DIAGNOSIS — R51 Headache: Secondary | ICD-10-CM | POA: Insufficient documentation

## 2012-08-12 DIAGNOSIS — S0990XA Unspecified injury of head, initial encounter: Secondary | ICD-10-CM

## 2012-08-12 DIAGNOSIS — IMO0002 Reserved for concepts with insufficient information to code with codable children: Secondary | ICD-10-CM | POA: Insufficient documentation

## 2012-08-12 MED ORDER — IBUPROFEN 800 MG PO TABS: ORAL_TABLET | ORAL | Status: DC

## 2012-08-12 MED ORDER — IBUPROFEN 800 MG PO TABS
800.0000 mg | ORAL_TABLET | Freq: Once | ORAL | Status: AC
  Administered 2012-08-12: 800 mg via ORAL
  Filled 2012-08-12: qty 1

## 2012-08-12 NOTE — ED Provider Notes (Signed)
History     CSN: [id number]  Arrival date & time 08/12/12  1617   First MD Initiated Contact with Patient 08/12/12 1654      Chief Complaint  Patient presents with  . Fall  . Head Injury    (Consider location/radiation/quality/duration/timing/severity/associated sxs/prior treatment) Patient is a 26 y.o. male presenting with head injury. The history is provided by the patient (pt fell off a ladder and hit his head.   no loc). No language interpreter was used.  Head Injury  The incident occurred 6 to 12 hours ago. He came to the ER via walk-in. The injury mechanism was a direct blow. There was no loss of consciousness. The volume of blood lost was moderate. The quality of the pain is described as dull. The pain is at a severity of 6/10. The pain is moderate. The pain has been constant since the injury. Pertinent negatives include no numbness. He was found conscious by EMS personnel. Treatment prior to arrival: nothing. The treatment provided moderate relief.    History reviewed. No pertinent past medical history.  Past Surgical History  Procedure Date  . Joint replacement     History reviewed. No pertinent family history.  History  Substance Use Topics  . Smoking status: Current Everyday Smoker -- 0.5 packs/day  . Smokeless tobacco: Not on file  . Alcohol Use: Yes     occasionally      Review of Systems  Constitutional: Negative for fatigue.  HENT: Negative for congestion, sinus pressure and ear discharge.   Eyes: Negative for discharge.  Respiratory: Negative for cough.   Cardiovascular: Negative for chest pain.  Gastrointestinal: Negative for abdominal pain and diarrhea.  Genitourinary: Negative for frequency and hematuria.  Musculoskeletal: Negative for back pain.  Skin: Negative for rash.  Neurological: Positive for headaches. Negative for seizures and numbness.  Hematological: Negative.   Psychiatric/Behavioral: Negative for hallucinations.    Allergies    Review of patient's allergies indicates no known allergies.  Home Medications   Current Outpatient Rx  Name Route Sig Dispense Refill  . IBUPROFEN 200 MG PO TABS Oral Take 400 mg by mouth every 8 (eight) hours as needed. For pain.    . IBUPROFEN 800 MG PO TABS  Take one pill every 8 hours as needed for pain 21 tablet 0    BP 140/81  Pulse 105  Temp 97.5 F (36.4 C) (Oral)  Resp 18  SpO2 97%  Physical Exam  Constitutional: He is oriented to person, place, and time. He appears well-developed.  HENT:  Head: Normocephalic.       4 cm lac to scalp  Eyes: Conjunctivae and EOM are normal. No scleral icterus.  Neck: Neck supple. No thyromegaly present.  Cardiovascular: Normal rate and regular rhythm.  Exam reveals no gallop and no friction rub.   No murmur heard. Pulmonary/Chest: No stridor. He has no wheezes. He has no rales. He exhibits no tenderness.  Abdominal: He exhibits no distension. There is no tenderness. There is no rebound.  Musculoskeletal: Normal range of motion. He exhibits no edema.       Abrasion to hand with old lac.  Abrasion to forearm  Lymphadenopathy:    He has no cervical adenopathy.  Neurological: He is oriented to person, place, and time. Coordination normal.  Skin: No rash noted. No erythema.  Psychiatric: He has a normal mood and affect. His behavior is normal.    ED Course  LACERATION REPAIR Performed by: Dajsha Massaro L Authorized  by: Eon Zunker L Comments: Pt has a 4 cm lac to top of scalp and the lac was closed with staples   (including critical care time)  Labs Reviewed - No data to display Dg Chest 2 View  08/12/2012  *RADIOLOGY REPORT*  Clinical Data: Chest pain.  Fell.  Smoker.  CHEST - 2 VIEW  Comparison: 03/08/2011.  Findings: The heart remains normal in size and the lungs are clear. The lungs remain mildly hyperexpanded with mild diffuse prominence of the interstitial markings.  Mild thoracic spine degenerative changes and mild  scoliosis.  No fracture pneumothorax seen.  IMPRESSION: Stable mild changes of COPD.  No acute abnormality.   Original Report Authenticated By: Darrol Angel, M.D.    Dg Forearm Right  08/12/2012  *RADIOLOGY REPORT*  Clinical Data: Right forearm pain following a fall.  RIGHT FOREARM - 2 VIEW  Comparison: Right wrist dated 05/23/2007 and right hand obtained today.  Findings: Normal appearing bones and soft tissues without fracture or dislocation.  IMPRESSION: Normal examination.   Original Report Authenticated By: Darrol Angel, M.D.    Ct Head Wo Contrast  08/12/2012  *RADIOLOGY REPORT*  Clinical Data:  Fall.  Head injury.  CT HEAD WITHOUT CONTRAST CT CERVICAL SPINE WITHOUT CONTRAST  Technique:  Multidetector CT imaging of the head and cervical spine was performed following the standard protocol without intravenous contrast.  Multiplanar CT image reconstructions of the cervical spine were also generated.  Comparison:  12/16/2010  CT HEAD  Findings: There is no evidence for acute hemorrhage, hydrocephalus, mass lesion, or abnormal extra-axial fluid collection.  No definite CT evidence for acute infarction.  No air-fluid levels in the paranasal sinuses.  Mild polypoid mucosal disease in the left maxillary sinus is stable.  IMPRESSION: Stable exam.  Normal CT of the brain.  CT CERVICAL SPINE  Findings: Imaging was obtained from the skull base through the T1-2 interspace.  No evidence for fracture.  No subluxation.  The intervertebral disc spaces are preserved.  Facets are well-aligned bilaterally.  There is no prevertebral soft tissue swelling. Normal cervical lordosis is preserved.  IMPRESSION: No evidence for cervical spine fracture.   Original Report Authenticated By: ERIC A. MANSELL, M.D.    Ct Cervical Spine Wo Contrast  08/12/2012  *RADIOLOGY REPORT*  Clinical Data:  Fall.  Head injury.  CT HEAD WITHOUT CONTRAST CT CERVICAL SPINE WITHOUT CONTRAST  Technique:  Multidetector CT imaging of the head and  cervical spine was performed following the standard protocol without intravenous contrast.  Multiplanar CT image reconstructions of the cervical spine were also generated.  Comparison:  12/16/2010  CT HEAD  Findings: There is no evidence for acute hemorrhage, hydrocephalus, mass lesion, or abnormal extra-axial fluid collection.  No definite CT evidence for acute infarction.  No air-fluid levels in the paranasal sinuses.  Mild polypoid mucosal disease in the left maxillary sinus is stable.  IMPRESSION: Stable exam.  Normal CT of the brain.  CT CERVICAL SPINE  Findings: Imaging was obtained from the skull base through the T1-2 interspace.  No evidence for fracture.  No subluxation.  The intervertebral disc spaces are preserved.  Facets are well-aligned bilaterally.  There is no prevertebral soft tissue swelling. Normal cervical lordosis is preserved.  IMPRESSION: No evidence for cervical spine fracture.   Original Report Authenticated By: ERIC A. MANSELL, M.D.    Dg Hand Complete Right  08/12/2012  *RADIOLOGY REPORT*  Clinical Data: Right hand pain following a fall.  RIGHT HAND -  COMPLETE 3+ VIEW  Comparison: Right thumb dated 06/22/2010 and right wrist dated 05/23/2007.  Findings: Normal appearing bones and soft tissues without fracture or dislocation.  IMPRESSION: Normal examination.   Original Report Authenticated By: Darrol Angel, M.D.      No diagnosis found.    MDM          Benny Lennert, MD 08/12/12 [phone]

## 2012-08-12 NOTE — ED Notes (Signed)
Pt reports he fell off a ladder around 09:00 this morning about 8 ft onto gravel surface. Reports hitting head on ladder on the way down.  Laceration to right hand from fall. Large laceration to top of head, bruising to right eye. Reports dizziness, blurred vision, loss of appetite, nausea, but no vomiting.

## 2012-08-12 NOTE — ED Notes (Signed)
c-collar placed in triage

## 2012-08-12 NOTE — ED Notes (Signed)
Pt verbalizes understanding 

## 2013-02-10 ENCOUNTER — Emergency Department (HOSPITAL_COMMUNITY): Payer: Medicaid Other

## 2013-02-10 ENCOUNTER — Emergency Department (HOSPITAL_COMMUNITY)
Admission: EM | Admit: 2013-02-10 | Discharge: 2013-02-10 | Disposition: A | Discharge status: Home / Self Care | Payer: Medicaid Other | Attending: Emergency Medicine | Admitting: Emergency Medicine

## 2013-02-10 ENCOUNTER — Encounter (HOSPITAL_COMMUNITY): Payer: Self-pay | Admitting: *Deleted

## 2013-02-10 DIAGNOSIS — J45909 Unspecified asthma, uncomplicated: Secondary | ICD-10-CM | POA: Insufficient documentation

## 2013-02-10 DIAGNOSIS — F17200 Nicotine dependence, unspecified, uncomplicated: Secondary | ICD-10-CM | POA: Insufficient documentation

## 2013-02-10 DIAGNOSIS — R21 Rash and other nonspecific skin eruption: Secondary | ICD-10-CM | POA: Insufficient documentation

## 2013-02-10 DIAGNOSIS — S93401A Sprain of unspecified ligament of right ankle, initial encounter: Secondary | ICD-10-CM

## 2013-02-10 DIAGNOSIS — Y929 Unspecified place or not applicable: Secondary | ICD-10-CM | POA: Insufficient documentation

## 2013-02-10 DIAGNOSIS — Y939 Activity, unspecified: Secondary | ICD-10-CM | POA: Insufficient documentation

## 2013-02-10 DIAGNOSIS — S8990XA Unspecified injury of unspecified lower leg, initial encounter: Secondary | ICD-10-CM | POA: Insufficient documentation

## 2013-02-10 DIAGNOSIS — W010XXA Fall on same level from slipping, tripping and stumbling without subsequent striking against object, initial encounter: Secondary | ICD-10-CM | POA: Insufficient documentation

## 2013-02-10 HISTORY — DX: Unspecified asthma, uncomplicated: J45.909

## 2013-02-10 MED ORDER — PERMETHRIN 5 % EX CREA: TOPICAL_CREAM | CUTANEOUS | Status: DC

## 2013-02-10 MED ORDER — HYDROXYZINE HCL 25 MG PO TABS: 25.0000 mg | ORAL_TABLET | Freq: Four times a day (QID) | ORAL | Status: DC

## 2013-02-10 MED FILL — Permethrin Liq Spray 0.25%: Qty: 147.86 | Status: AC

## 2013-02-10 NOTE — Progress Notes (Signed)
CM signing off medication assistance completed

## 2013-02-10 NOTE — ED Notes (Signed)
Pt reports slipping on stairs due to ice on Saturday, injuring right ankle. Pt also reports rash/itching all over body for 2 months.

## 2013-02-10 NOTE — Progress Notes (Signed)
[facility] ED Cm consulted to assist self pay pt with [facility] MATCH program for permethrine CM spoke with CM leadership CM completed MATCH letter and provided to pt  CM spoke with pt who confirms self pay [facility] resident with no pcp. CM discussed and provided written information for self pay pcps, importance of pcp for f/u care, www.needymeds.org, discounted pharmacies, and other [place] resources such as financial assistance, DSS and  health department Reviewed Health connect number to assist with finding self pay provider close to pt's residence. Reviewed resources for [facility], general medical clinics, [facility] out patient [facility], housing, affordable care act/health reform (deadline 03/03/13) and other resources in [employer]. Pt voiced understanding and appreciation of resources provided

## 2013-02-10 NOTE — ED Provider Notes (Signed)
History    This chart was scribed for non-physician practitioner working with Lyanne Co, MD by ED Scribe, Burman Nieves. This patient was seen in room WTR8/WTR8 and the patient's care was started at 3:52 PM.  CSN: [id number]  Arrival date & time 02/10/13  1522   First MD Initiated Contact with Patient 02/10/13 1552      Chief Complaint  Patient presents with  . Ankle Injury    right  . Ankle Pain    right  . Rash    (Consider location/radiation/quality/duration/timing/severity/associated sxs/prior treatment) Patient is a 27 y.o. male presenting with lower extremity injury, ankle pain, and rash. The history is provided by the patient. No language interpreter was used.  Ankle Injury  Ankle Pain Rash Associated symptoms: joint pain   Ankle Injury Associated symptoms include arthralgias and a rash.   Jason Buchanan is a 27 y.o. male who presents to the Emergency Department complaining of moderate constant right ankle pain onset Saturday. Pt reports that he was outside when he slipped and fell on some ice injuring his right ankle. Pain associated with mild swelling.  Pt states that he has taken ibuprofen with no immediate relief. He has used crutches to alleviate the pain and swelling. He has had a previous surgery of the right ankle.  Pt also complains of itchy rash all over body with an onset about a month ago. He states he may have acquired rash staying at a hotel. He complains of moderate constant itching all over his body. Girlfriend with similar rash.  He has tried using anti-itch cream and Hydrocortisone without relief.  Pt denies trouble breathing or swallowing. Pt denies head trauma, LOC, fever, chills, cough, nausea, vomiting, diarrhea, SOB, weakness, and any other associated symptoms.    Past Medical History  Diagnosis Date  . Asthma     Past Surgical History  Procedure Laterality Date  . Joint replacement    . Fracture surgery      History reviewed. No pertinent  family history.  History  Substance Use Topics  . Smoking status: Current Every Day Smoker -- 0.50 packs/day  . Smokeless tobacco: Never Used  . Alcohol Use: Yes     Comment: occasionally      Review of Systems  Musculoskeletal: Positive for arthralgias.       Right ankle pain  Skin: Positive for rash.  All other systems reviewed and are negative.    Allergies  Review of patient's allergies indicates no known allergies.  Home Medications   Current Outpatient Rx  Name  Route  Sig  Dispense  Refill  . ibuprofen (ADVIL,MOTRIN) 200 MG tablet   Oral   Take 400 mg by mouth every 8 (eight) hours as needed. For pain.         Marland Kitchen ibuprofen (ADVIL,MOTRIN) 800 MG tablet      Take one pill every 8 hours as needed for pain   21 tablet   0     BP 131/73  Pulse 95  Temp(Src) 98 F (36.7 C) (Oral)  Resp 20  Wt 135 lb (61.236 kg)  SpO2 99%  Physical Exam  Nursing note and vitals reviewed. Constitutional: He appears well-developed and well-nourished. No distress.  HENT:  Head: Normocephalic and atraumatic.  Mouth/Throat: Oropharynx is clear and moist.  Eyes: EOM are normal. Pupils are equal, round, and reactive to light.  Neck: Normal range of motion. Neck supple.  Cardiovascular: Normal rate, regular rhythm, normal heart sounds and intact  distal pulses.   Pulmonary/Chest: Effort normal and breath sounds normal. No respiratory distress. He has no wheezes.  Musculoskeletal: Normal range of motion.       Right ankle: He exhibits no swelling, no ecchymosis, no deformity, no laceration and normal pulse. Tenderness. Lateral malleolus tenderness found.  Pain over the right lateral malleolus and no tenderness over medial malleolus. Good dorsal pedis pulse in the right foot. Good sensation in distal toes.     Neurological: He is alert.  Skin: Skin is warm and dry. Rash noted. He is not diaphoretic. There is erythema.  Erythematous papular rash all over his  legs, back, abdomen,  genitals, legs, feet, dorsal aspect of both knees, and webspaces of the fingers  Psychiatric: He has a normal mood and affect. His behavior is normal.    ED Course  Procedures (including critical care time) DIAGNOSTIC STUDIES: Oxygen Saturation is 99% on room air, normal by my interpretation.    COORDINATION OF CARE: 5:34 PM Discussed ED treatment with pt and pt agrees.  6:15 PM  Pt unable to get Permethrin medication due to financial issue. Consulted Case Management.  Case management was able to provide discount prescription to pt.                                                                                                                                                                                                                                                                                                                                                                Labs Reviewed - No data to display Dg Ankle Complete Right  02/10/2013  *RADIOLOGY REPORT*  Clinical Data: Right ankle pain.  Fell 2 days ago.  Previous fractures and surgery.  RIGHT ANKLE - COMPLETE 3+ VIEW  Comparison: Right lower  leg dated 08/19/2011.  Findings: Stable hardware fixation of the distal tibia and fibula with old, healed fractures.  Stable bone fragment distal to the lateral malleolus and lateral to the talus.  No acute fractures or dislocation.  Stable screw holes in the tibia and talus.  IMPRESSION: No acute abnormality.   Original Report Authenticated By: Beckie Salts, M.D.      No diagnosis found.    MDM  Patient presenting with right ankle pain that occurred after falling 2 days ago.  No acute findings on xray.  Patient neurovascularly intact.  Patient given ankle ASO and instructed to follow up with Orthopedics if pain persists.  Patient has crutches at home.  Patient also with rash consistent with Scabies in appearance and distribution.  Case management consulted to help patient afford Permethrin  Cream.    I personally performed the services described in this documentation, which was scribed in my presence. The recorded information has been reviewed and is accurate.     Pascal Lux Hoback, PA-C 02/10/13 [phone]

## 2013-02-11 NOTE — ED Provider Notes (Signed)
Medical screening examination/treatment/procedure(s) were performed by non-physician practitioner and as supervising physician I was immediately available for consultation/collaboration.   Lyanne Co, MD 02/11/13 0100

## 2013-06-04 ENCOUNTER — Emergency Department (HOSPITAL_COMMUNITY): Payer: Medicaid Other

## 2013-06-04 ENCOUNTER — Encounter (HOSPITAL_COMMUNITY): Payer: Self-pay | Admitting: Emergency Medicine

## 2013-06-04 ENCOUNTER — Emergency Department (HOSPITAL_COMMUNITY)
Admission: EM | Admit: 2013-06-04 | Discharge: 2013-06-04 | Disposition: A | Discharge status: Home / Self Care | Payer: Medicaid Other | Attending: Emergency Medicine | Admitting: Emergency Medicine

## 2013-06-04 DIAGNOSIS — S20412A Abrasion of left back wall of thorax, initial encounter: Secondary | ICD-10-CM

## 2013-06-04 DIAGNOSIS — S1091XA Abrasion of unspecified part of neck, initial encounter: Secondary | ICD-10-CM

## 2013-06-04 DIAGNOSIS — S0093XA Contusion of unspecified part of head, initial encounter: Secondary | ICD-10-CM

## 2013-06-04 DIAGNOSIS — Z79899 Other long term (current) drug therapy: Secondary | ICD-10-CM | POA: Insufficient documentation

## 2013-06-04 DIAGNOSIS — R1110 Vomiting, unspecified: Secondary | ICD-10-CM | POA: Insufficient documentation

## 2013-06-04 DIAGNOSIS — T71191A Asphyxiation due to mechanical threat to breathing due to other causes, accidental, initial encounter: Secondary | ICD-10-CM | POA: Insufficient documentation

## 2013-06-04 DIAGNOSIS — J45909 Unspecified asthma, uncomplicated: Secondary | ICD-10-CM | POA: Insufficient documentation

## 2013-06-04 DIAGNOSIS — IMO0002 Reserved for concepts with insufficient information to code with codable children: Secondary | ICD-10-CM | POA: Insufficient documentation

## 2013-06-04 DIAGNOSIS — R0602 Shortness of breath: Secondary | ICD-10-CM | POA: Insufficient documentation

## 2013-06-04 DIAGNOSIS — S060X9A Concussion with loss of consciousness of unspecified duration, initial encounter: Secondary | ICD-10-CM | POA: Insufficient documentation

## 2013-06-04 DIAGNOSIS — S93402A Sprain of unspecified ligament of left ankle, initial encounter: Secondary | ICD-10-CM

## 2013-06-04 DIAGNOSIS — S53401A Unspecified sprain of right elbow, initial encounter: Secondary | ICD-10-CM

## 2013-06-04 DIAGNOSIS — F17200 Nicotine dependence, unspecified, uncomplicated: Secondary | ICD-10-CM | POA: Insufficient documentation

## 2013-06-04 DIAGNOSIS — S93409A Sprain of unspecified ligament of unspecified ankle, initial encounter: Secondary | ICD-10-CM | POA: Insufficient documentation

## 2013-06-04 DIAGNOSIS — R05 Cough: Secondary | ICD-10-CM | POA: Insufficient documentation

## 2013-06-04 DIAGNOSIS — R42 Dizziness and giddiness: Secondary | ICD-10-CM | POA: Insufficient documentation

## 2013-06-04 DIAGNOSIS — R4020 Unspecified coma: Secondary | ICD-10-CM

## 2013-06-04 DIAGNOSIS — R059 Cough, unspecified: Secondary | ICD-10-CM | POA: Insufficient documentation

## 2013-06-04 LAB — CBC WITH DIFFERENTIAL/PLATELET
Basophils Absolute: 0.1 10*3/uL (ref 0.0–0.1)
Eosinophils Relative: 1 % (ref 0–5)
Lymphocytes Relative: 19 % (ref 12–46)
MCV: 90 fL (ref 78.0–100.0)
Neutro Abs: 13.4 10*3/uL — ABNORMAL HIGH (ref 1.7–7.7)
Neutrophils Relative %: 73 % (ref 43–77)
Platelets: 306 10*3/uL (ref 150–400)
RDW: 12.4 % (ref 11.5–15.5)
WBC: 18.4 10*3/uL — ABNORMAL HIGH (ref 4.0–10.5)

## 2013-06-04 LAB — POCT I-STAT, CHEM 8
BUN: 8 mg/dL (ref 6–23)
Hemoglobin: 14.6 g/dL (ref 13.0–17.0)
Potassium: 3.7 mEq/L (ref 3.5–5.1)
Sodium: 139 mEq/L (ref 135–145)
TCO2: 24 mmol/L (ref 0–100)

## 2013-06-04 MED ORDER — ONDANSETRON HCL 4 MG/2ML IJ SOLN
INTRAMUSCULAR | Status: AC
  Administered 2013-06-04: 4 mg
  Filled 2013-06-04: qty 2

## 2013-06-04 MED ORDER — IOHEXOL 300 MG/ML  SOLN: 80.0000 mL | Freq: Once | INTRAMUSCULAR | Status: DC | PRN

## 2013-06-04 MED ORDER — SODIUM CHLORIDE 0.9 % IV BOLUS (SEPSIS)
500.0000 mL | Freq: Once | INTRAVENOUS | Status: AC
  Administered 2013-06-04: 500 mL via INTRAVENOUS

## 2013-06-04 MED ORDER — HYDROMORPHONE HCL PF 1 MG/ML IJ SOLN
1.0000 mg | Freq: Once | INTRAMUSCULAR | Status: AC
  Administered 2013-06-04: 1 mg via INTRAVENOUS
  Filled 2013-06-04: qty 1

## 2013-06-04 MED ORDER — DIAZEPAM 5 MG PO TABS: 5.0000 mg | ORAL_TABLET | Freq: Four times a day (QID) | ORAL | Status: DC | PRN

## 2013-06-04 MED ORDER — OXYCODONE-ACETAMINOPHEN 5-325 MG PO TABS: 1.0000 | ORAL_TABLET | Freq: Four times a day (QID) | ORAL | Status: DC | PRN

## 2013-06-04 MED ORDER — ONDANSETRON 4 MG PO TBDP: ORAL_TABLET | ORAL | Status: DC

## 2013-06-04 NOTE — ED Notes (Signed)
Radiology tech here to take patient for elbow xray. Based on RN assessment, will hold for now until evaluated by ED provider.
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
## 2013-06-04 NOTE — ED Notes (Signed)
Pt refused crutches, stated that he had a cane at home and that he was unable to use crutches with his right arm.

## 2013-06-05 LAB — GC/CHLAMYDIA PROBE AMP: CT Probe RNA: POSITIVE — AB

## 2013-06-05 LAB — RPR: RPR Ser Ql: NONREACTIVE
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
## 2013-06-06 NOTE — ED Notes (Signed)
+  Chlamydia Chart sent to EDP office for review.  

## 2013-06-12 NOTE — ED Notes (Signed)
Chart returned from EDP office -06/11/2013 With  Orders for 1 gram po Zithromax x 1 written by [employer] lawyer needs to be called to pharmacy.

## 2013-06-15 ENCOUNTER — Telehealth (HOSPITAL_COMMUNITY): Payer: Self-pay | Admitting: Emergency Medicine
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
## 2015-11-05 ENCOUNTER — Emergency Department (HOSPITAL_COMMUNITY): Payer: Medicaid Other

## 2015-11-05 ENCOUNTER — Emergency Department (HOSPITAL_COMMUNITY)
Admission: EM | Admit: 2015-11-05 | Discharge: 2015-11-05 | Disposition: A | Discharge status: Home / Self Care | Payer: Medicaid Other | Attending: Emergency Medicine | Admitting: Emergency Medicine

## 2015-11-05 ENCOUNTER — Encounter (HOSPITAL_COMMUNITY): Payer: Self-pay | Admitting: Family Medicine

## 2015-11-05 DIAGNOSIS — Y9389 Activity, other specified: Secondary | ICD-10-CM | POA: Diagnosis not present

## 2015-11-05 DIAGNOSIS — Y92410 Unspecified street and highway as the place of occurrence of the external cause: Secondary | ICD-10-CM | POA: Diagnosis not present

## 2015-11-05 DIAGNOSIS — S3992XA Unspecified injury of lower back, initial encounter: Secondary | ICD-10-CM | POA: Diagnosis not present

## 2015-11-05 DIAGNOSIS — S8991XA Unspecified injury of right lower leg, initial encounter: Secondary | ICD-10-CM | POA: Diagnosis present

## 2015-11-05 DIAGNOSIS — J45909 Unspecified asthma, uncomplicated: Secondary | ICD-10-CM | POA: Insufficient documentation

## 2015-11-05 DIAGNOSIS — M549 Dorsalgia, unspecified: Secondary | ICD-10-CM

## 2015-11-05 DIAGNOSIS — Y998 Other external cause status: Secondary | ICD-10-CM | POA: Diagnosis not present

## 2015-11-05 DIAGNOSIS — M79604 Pain in right leg: Secondary | ICD-10-CM

## 2015-11-05 DIAGNOSIS — F17200 Nicotine dependence, unspecified, uncomplicated: Secondary | ICD-10-CM | POA: Diagnosis not present

## 2015-11-05 DIAGNOSIS — S99911A Unspecified injury of right ankle, initial encounter: Secondary | ICD-10-CM | POA: Diagnosis not present

## 2015-11-05 MED ORDER — NAPROXEN 500 MG PO TABS: 500.0000 mg | ORAL_TABLET | Freq: Two times a day (BID) | ORAL | Status: DC

## 2015-11-05 MED ORDER — METHOCARBAMOL 500 MG PO TABS: 500.0000 mg | ORAL_TABLET | Freq: Two times a day (BID) | ORAL | Status: DC

## 2015-11-05 NOTE — Discharge Instructions (Signed)
Take the prescribed medication as directed. Follow-up with Dr. Carola FrostHandy-- please see his contact information to call and make appt. Return to the ED for new or worsening symptoms.
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
## 2016-01-07 ENCOUNTER — Encounter (HOSPITAL_COMMUNITY): Payer: Self-pay | Admitting: *Deleted

## 2016-01-07 ENCOUNTER — Emergency Department (HOSPITAL_COMMUNITY)
Admission: EM | Admit: 2016-01-07 | Discharge: 2016-01-07 | Disposition: A | Discharge status: Home / Self Care | Payer: Medicaid Other | Attending: Physician Assistant | Admitting: Physician Assistant

## 2016-01-07 DIAGNOSIS — W010XXA Fall on same level from slipping, tripping and stumbling without subsequent striking against object, initial encounter: Secondary | ICD-10-CM | POA: Diagnosis not present

## 2016-01-07 DIAGNOSIS — M546 Pain in thoracic spine: Secondary | ICD-10-CM

## 2016-01-07 DIAGNOSIS — Z791 Long term (current) use of non-steroidal anti-inflammatories (NSAID): Secondary | ICD-10-CM | POA: Insufficient documentation

## 2016-01-07 DIAGNOSIS — F17210 Nicotine dependence, cigarettes, uncomplicated: Secondary | ICD-10-CM | POA: Insufficient documentation

## 2016-01-07 DIAGNOSIS — S29002A Unspecified injury of muscle and tendon of back wall of thorax, initial encounter: Secondary | ICD-10-CM | POA: Insufficient documentation

## 2016-01-07 DIAGNOSIS — Z79899 Other long term (current) drug therapy: Secondary | ICD-10-CM | POA: Diagnosis not present

## 2016-01-07 DIAGNOSIS — M25571 Pain in right ankle and joints of right foot: Secondary | ICD-10-CM

## 2016-01-07 DIAGNOSIS — Y998 Other external cause status: Secondary | ICD-10-CM | POA: Diagnosis not present

## 2016-01-07 DIAGNOSIS — Y9289 Other specified places as the place of occurrence of the external cause: Secondary | ICD-10-CM | POA: Insufficient documentation

## 2016-01-07 DIAGNOSIS — J45998 Other asthma: Secondary | ICD-10-CM | POA: Insufficient documentation

## 2016-01-07 DIAGNOSIS — Y9389 Activity, other specified: Secondary | ICD-10-CM | POA: Diagnosis not present

## 2016-01-07 DIAGNOSIS — S99911A Unspecified injury of right ankle, initial encounter: Secondary | ICD-10-CM | POA: Insufficient documentation

## 2016-01-07 MED ORDER — METHOCARBAMOL 500 MG PO TABS
750.0000 mg | ORAL_TABLET | Freq: Once | ORAL | Status: AC
  Administered 2016-01-07: 750 mg via ORAL
  Filled 2016-01-07: qty 2

## 2016-01-07 MED ORDER — METHOCARBAMOL 500 MG PO TABS: 500.0000 mg | ORAL_TABLET | Freq: Three times a day (TID) | ORAL | Status: DC | PRN

## 2016-01-07 MED ORDER — IBUPROFEN 800 MG PO TABS: 800.0000 mg | ORAL_TABLET | Freq: Three times a day (TID) | ORAL | Status: DC

## 2016-01-07 NOTE — Discharge Instructions (Signed)
Ankle Pain Ankle pain is a common symptom. The bones, cartilage, tendons, and muscles of the ankle joint perform a lot of work each day. The ankle joint holds your body weight and allows you to move around. Ankle pain can occur on either side or back of 1 or both ankles. Ankle pain may be sharp and burning or dull and aching. There may be tenderness, stiffness, redness, or warmth around the ankle. The pain occurs more often when a person walks or puts pressure on the ankle. CAUSES  There are many reasons ankle pain can develop. It is important to work with your caregiver to identify the cause since many conditions can impact the bones, cartilage, muscles, and tendons. Causes for ankle pain include:  Injury, including a break (fracture), sprain, or strain often due to a fall, sports, or a high-impact activity.  Swelling (inflammation) of a tendon (tendonitis).  Achilles tendon rupture.  Ankle instability after repeated sprains and strains.  Poor foot alignment.  Pressure on a nerve (tarsal tunnel syndrome).  Arthritis in the ankle or the lining of the ankle.  Crystal formation in the ankle (gout or pseudogout). DIAGNOSIS  A diagnosis is based on your medical history, your symptoms, results of your physical exam, and results of diagnostic tests. Diagnostic tests may include X-ray exams or a computerized magnetic scan (magnetic resonance imaging, MRI). TREATMENT  Treatment will depend on the cause of your ankle pain and may include:  Keeping pressure off the ankle and limiting activities.  Using crutches or other walking support (a cane or brace).  Using rest, ice, compression, and elevation.  Participating in physical therapy or home exercises.  Wearing shoe inserts or special shoes.  Losing weight.  Taking medications to reduce pain or swelling or receiving an injection.  Undergoing surgery. HOME CARE INSTRUCTIONS   Only take over-the-counter or prescription medicines for  pain, discomfort, or fever as directed by your caregiver.  Put ice on the injured area.  Put ice in a plastic bag.  Place a towel between your skin and the bag.  Leave the ice on for 15-20 minutes at a time, 03-04 times a day.  Keep your leg raised (elevated) when possible to lessen swelling.  Avoid activities that cause ankle pain.  Follow specific exercises as directed by your caregiver.  Record how often you have ankle pain, the location of the pain, and what it feels like. This information may be helpful to you and your caregiver.  Ask your caregiver about returning to work or sports and whether you should drive.  Follow up with your caregiver for further examination, therapy, or testing as directed. SEEK MEDICAL CARE IF:   Pain or swelling continues or worsens beyond 1 week.  You have an oral temperature above 102 F (38.9 C).  You are feeling unwell or have chills.  You are having an increasingly difficult time with walking.  You have loss of sensation or other new symptoms.  You have questions or concerns. MAKE SURE YOU:   Understand these instructions.  Will watch your condition.  Will get help right away if you are not doing well or get worse.   This information is not intended to replace advice given to you by your health care provider. Make sure you discuss any questions you have with your health care provider.   Document Released: 05/10/2010 Document Revised: 02/12/2012 Document Reviewed: 06/22/2015 Elsevier Interactive Patient Education 2016 [employer] Muscle Strain A muscle strain is an injury that  occurs when a muscle is stretched beyond its normal length. Usually a small number of muscle fibers are torn when this happens. Muscle strain is rated in degrees. First-degree strains have the least amount of muscle fiber tearing and pain. Second-degree and third-degree strains have increasingly more tearing and pain.  Usually, recovery from muscle strain  takes 1-2 weeks. Complete healing takes 5-6 weeks.  CAUSES  Muscle strain happens when a sudden, violent force placed on a muscle stretches it too far. This may occur with lifting, sports, or a fall.  RISK FACTORS Muscle strain is especially common in athletes.  SIGNS AND SYMPTOMS At the site of the muscle strain, there may be:  Pain.  Bruising.  Swelling.  Difficulty using the muscle due to pain or lack of normal function. DIAGNOSIS  Your health care provider will perform a physical exam and ask about your medical history. TREATMENT  Often, the best treatment for a muscle strain is resting, icing, and applying cold compresses to the injured area.  HOME CARE INSTRUCTIONS   Use the PRICE method of treatment to promote muscle healing during the first 2-3 days after your injury. The PRICE method involves:  Protecting the muscle from being injured again.  Restricting your activity and resting the injured body part.  Icing your injury. To do this, put ice in a plastic bag. Place a towel between your skin and the bag. Then, apply the ice and leave it on from 15-20 minutes each hour. After the third day, switch to moist heat packs.  Apply compression to the injured area with a splint or elastic bandage. Be careful not to wrap it too tightly. This may interfere with blood circulation or increase swelling.  Elevate the injured body part above the level of your heart as often as you can.  Only take over-the-counter or prescription medicines for pain, discomfort, or fever as directed by your health care provider.  Warming up prior to exercise helps to prevent future muscle strains. SEEK MEDICAL CARE IF:   You have increasing pain or swelling in the injured area.  You have numbness, tingling, or a significant loss of strength in the injured area. MAKE SURE YOU:   Understand these instructions.  Will watch your condition.  Will get help right away if you are not doing well or get  worse.   This information is not intended to replace advice given to you by your health care provider. Make sure you discuss any questions you have with your health care provider.   Document Released: 11/20/2005 Document Revised: 09/10/2013 Document Reviewed: 06/19/2013 Elsevier Interactive Patient Education [employer]

## 2016-01-07 NOTE — ED Notes (Signed)
Pt states that he was moving furniture yesterday and was carrying a dresser that slipped and he fell into the railing; pt c/o lower back pain; pt denies numbness or tingling or incontinence; pt also c.o left ankle pain; pt states that he has had surgery on left ankle and states "I cannot put my full weight on it"

## 2016-01-07 NOTE — ED Provider Notes (Signed)
History  By signing my name below, I, Karle Plumber, attest that this documentation has been prepared under the direction and in the presence of Elpidio Anis, PA-C. Electronically Signed: Karle Plumber, ED Scribe. 01/07/2016. 10:09 PM.  Chief Complaint  Patient presents with  . Back Pain  . Ankle Pain   The history is provided by the patient and medical records. No language interpreter was used.    HPI Comments:  Jason Buchanan is a 30 y.o. male who presents to the Emergency Department complaining of moderate mid to lower back pain that began yesterday while moving furniture. He states he was moving a Child psychotherapist and dropped it. He also reports twisting his left ankle at the time of the incident. He reports associated swelling to the left ankle. He has been taking Aleve and Ibuprofen for the pain with minimal relief. He also has been soaking in a warm tub with little relief. Pt reports applying ice to the left ankle. Movements and bearing weight on the LLE increase the pain. He denies alleviating factors.  He denies abdominal pain, numbness, tingling or weakness of the lower extremities. He denies allergies to any medications. He states he had surgery on the left ankle several years ago.   Past Medical History  Diagnosis Date  . Asthma    Past Surgical History  Procedure Laterality Date  . Joint replacement    . Fracture surgery    . Ankle surgery     No family history on file. Social History  Substance Use Topics  . Smoking status: Current Every Day Smoker -- 0.50 packs/day    Types: Cigarettes  . Smokeless tobacco: Never Used  . Alcohol Use: Yes     Comment: occasionally    Review of Systems  Musculoskeletal: Positive for back pain, joint swelling and arthralgias.  All other systems reviewed and are negative.   Allergies  Review of patient's allergies indicates no known allergies.  Home Medications   Prior to Admission medications   Medication Sig Start Date End  Date Taking? Authorizing Provider  diazepam (VALIUM) 5 MG tablet Take 1 tablet (5 mg total) by mouth every 6 (six) hours as needed for anxiety (spasms). 06/04/13   Otis Brace, MD  ibuprofen (ADVIL,MOTRIN) 200 MG tablet Take 400 mg by mouth every 8 (eight) hours as needed. For pain.    Historical Provider, MD  methocarbamol (ROBAXIN) 500 MG tablet Take 1 tablet (500 mg total) by mouth 2 (two) times daily. 11/05/15   Garlon Hatchet, PA-C  naproxen (NAPROSYN) 500 MG tablet Take 1 tablet (500 mg total) by mouth 2 (two) times daily with a meal. 11/05/15   Garlon Hatchet, PA-C  ondansetron (ZOFRAN ODT) 4 MG disintegrating tablet  ODT q4 hours prn nausea/vomit 06/04/13   Otis Brace, MD  oxyCODONE-acetaminophen (PERCOCET) 5-325 MG per tablet Take 1-2 tablets by mouth every 6 (six) hours as needed for pain. 06/04/13   Otis Brace, MD   Triage Vitals: BP 146/79 mmHg  Pulse 79  Temp(Src) 97.8 F (36.6 C) (Oral)  Resp 20  SpO2 96% Physical Exam  Constitutional: He is oriented to person, place, and time. He appears well-developed and well-nourished.  HENT:  Head: Normocephalic and atraumatic.  Eyes: EOM are normal.  Neck: Normal range of motion.  Cardiovascular: Normal rate.   Pulmonary/Chest: Effort normal.  Abdominal: Soft. There is no tenderness.  Musculoskeletal: Normal range of motion.  Right greater than left, paraspinal tenderness to the lower thoracic area. No  swelling. No flank tenderness. Full ROM of lower extremities. Right ankle without swelling or redness. Joint stable. Tendons intact.  Neurological: He is alert and oriented to person, place, and time.  NVI  Skin: Skin is warm and dry.  Psychiatric: He has a normal mood and affect. His behavior is normal.  Nursing note and vitals reviewed.   ED Course  Procedures (including critical care time) DIAGNOSTIC STUDIES: Oxygen Saturation is 96% on RA, normal by my interpretation.   COORDINATION OF CARE: 10:07 PM- Advised pt to  continue taking OTC Ibuprofen. Will prescribe muscle relaxer and will give first dose prior to discharge. Pt verbalizes understanding and agrees to plan.  Medications  methocarbamol (ROBAXIN) tablet 750 mg (not administered)    MDM   Final diagnoses:  None    1. Back pain 2. Muscle strain 3. Right ankle pain  Exam and history c/w muscular and soft tissue strain injuries requiring supportive care.  I personally performed the services described in this documentation, which was scribed in my presence. The recorded information has been reviewed and is accurate.     Elpidio Anis, PA-C 01/08/16 2319  Courteney Randall An, MD 01/08/16 2337

## 2016-09-16 ENCOUNTER — Emergency Department (HOSPITAL_BASED_OUTPATIENT_CLINIC_OR_DEPARTMENT_OTHER)
Admission: EM | Admit: 2016-09-16 | Discharge: 2016-09-16 | Disposition: A | Discharge status: Home / Self Care | Payer: Medicaid Other | Attending: Emergency Medicine | Admitting: Emergency Medicine

## 2016-09-16 ENCOUNTER — Emergency Department (HOSPITAL_BASED_OUTPATIENT_CLINIC_OR_DEPARTMENT_OTHER): Payer: Medicaid Other

## 2016-09-16 ENCOUNTER — Encounter (HOSPITAL_BASED_OUTPATIENT_CLINIC_OR_DEPARTMENT_OTHER): Payer: Self-pay | Admitting: Emergency Medicine

## 2016-09-16 DIAGNOSIS — Y92410 Unspecified street and highway as the place of occurrence of the external cause: Secondary | ICD-10-CM | POA: Diagnosis not present

## 2016-09-16 DIAGNOSIS — Y939 Activity, unspecified: Secondary | ICD-10-CM | POA: Diagnosis not present

## 2016-09-16 DIAGNOSIS — F1910 Other psychoactive substance abuse, uncomplicated: Secondary | ICD-10-CM | POA: Insufficient documentation

## 2016-09-16 DIAGNOSIS — J45909 Unspecified asthma, uncomplicated: Secondary | ICD-10-CM | POA: Diagnosis not present

## 2016-09-16 DIAGNOSIS — Z5181 Encounter for therapeutic drug level monitoring: Secondary | ICD-10-CM | POA: Insufficient documentation

## 2016-09-16 DIAGNOSIS — F17210 Nicotine dependence, cigarettes, uncomplicated: Secondary | ICD-10-CM | POA: Diagnosis not present

## 2016-09-16 DIAGNOSIS — S0990XA Unspecified injury of head, initial encounter: Secondary | ICD-10-CM | POA: Diagnosis not present

## 2016-09-16 DIAGNOSIS — Y999 Unspecified external cause status: Secondary | ICD-10-CM | POA: Insufficient documentation

## 2016-09-16 DIAGNOSIS — R4182 Altered mental status, unspecified: Secondary | ICD-10-CM | POA: Diagnosis not present

## 2016-09-16 HISTORY — DX: Other psychoactive substance abuse, uncomplicated: F19.10

## 2016-09-16 HISTORY — DX: Unspecified mood (affective) disorder: F39

## 2016-09-16 LAB — URINALYSIS, ROUTINE W REFLEX MICROSCOPIC
Bilirubin Urine: NEGATIVE
GLUCOSE, UA: NEGATIVE mg/dL
Hgb urine dipstick: NEGATIVE
KETONES UR: NEGATIVE mg/dL
LEUKOCYTES UA: NEGATIVE
Nitrite: NEGATIVE
PH: 6.5 (ref 5.0–8.0)
Protein, ur: NEGATIVE mg/dL
Specific Gravity, Urine: 1.016 (ref 1.005–1.030)

## 2016-09-16 LAB — CBC WITH DIFFERENTIAL/PLATELET
BASOS PCT: 0 %
Basophils Absolute: 0.1 10*3/uL (ref 0.0–0.1)
EOS ABS: 0.5 10*3/uL (ref 0.0–0.7)
EOS PCT: 4 %
HCT: 42.5 % (ref 39.0–52.0)
HEMOGLOBIN: 14.2 g/dL (ref 13.0–17.0)
Lymphocytes Relative: 26 %
Lymphs Abs: 3.4 10*3/uL (ref 0.7–4.0)
MCH: 28.7 pg (ref 26.0–34.0)
MCHC: 33.4 g/dL (ref 30.0–36.0)
MCV: 86 fL (ref 78.0–100.0)
MONO ABS: 1 10*3/uL (ref 0.1–1.0)
MONOS PCT: 8 %
NEUTROS PCT: 62 %
Neutro Abs: 8.1 10*3/uL — ABNORMAL HIGH (ref 1.7–7.7)
PLATELETS: 321 10*3/uL (ref 150–400)
RBC: 4.94 MIL/uL (ref 4.22–5.81)
RDW: 13.4 % (ref 11.5–15.5)
WBC: 13.1 10*3/uL — ABNORMAL HIGH (ref 4.0–10.5)

## 2016-09-16 LAB — COMPREHENSIVE METABOLIC PANEL
ALBUMIN: 4.5 g/dL (ref 3.5–5.0)
ALT: 23 U/L (ref 17–63)
AST: 25 U/L (ref 15–41)
Alkaline Phosphatase: 82 U/L (ref 38–126)
Anion gap: 8 (ref 5–15)
BILIRUBIN TOTAL: 0.7 mg/dL (ref 0.3–1.2)
BUN: 17 mg/dL (ref 6–20)
CALCIUM: 9.6 mg/dL (ref 8.9–10.3)
CHLORIDE: 104 mmol/L (ref 101–111)
CO2: 26 mmol/L (ref 22–32)
CREATININE: 0.77 mg/dL (ref 0.61–1.24)
GFR calc Af Amer: >60 mL/min (ref >60)
Glucose, Bld: 98 mg/dL (ref 65–99)
Potassium: 3.3 mmol/L — ABNORMAL LOW (ref 3.5–5.1)
SODIUM: 138 mmol/L (ref 135–145)
TOTAL PROTEIN: 7.7 g/dL (ref 6.5–8.1)

## 2016-09-16 LAB — RAPID URINE DRUG SCREEN, HOSP PERFORMED
Amphetamines: NOT DETECTED
BARBITURATES: NOT DETECTED
BENZODIAZEPINES: POSITIVE — AB
Cocaine: POSITIVE — AB
Opiates: POSITIVE — AB
Tetrahydrocannabinol: POSITIVE — AB

## 2016-09-16 LAB — ACETAMINOPHEN LEVEL

## 2016-09-16 LAB — CBG MONITORING, ED: Glucose-Capillary: 113 mg/dL — ABNORMAL HIGH (ref 65–99)

## 2016-09-16 LAB — SALICYLATE LEVEL: Salicylate Lvl: <7 mg/dL (ref 2.8–30.0)

## 2016-09-16 LAB — ETHANOL

## 2016-09-16 MED ORDER — NALOXONE HCL 0.4 MG/ML IJ SOLN
INTRAMUSCULAR | Status: AC
  Filled 2016-09-16: qty 1

## 2016-09-16 MED ORDER — NALOXONE HCL 2 MG/2ML IJ SOSY
1.0000 mg | PREFILLED_SYRINGE | Freq: Once | INTRAMUSCULAR | Status: AC
  Administered 2016-09-16: 1 mg via INTRAMUSCULAR
  Filled 2016-09-16: qty 2

## 2016-09-16 MED ORDER — SODIUM CHLORIDE 0.9 % IV BOLUS (SEPSIS)
1000.0000 mL | Freq: Once | INTRAVENOUS | Status: DC
  Administered 2016-09-16: 1000 mL via INTRAVENOUS

## 2016-09-16 MED ORDER — SODIUM CHLORIDE 0.9 % IV SOLN
INTRAVENOUS | Status: DC
  Administered 2016-09-16: 08:00:00 via INTRAVENOUS
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
## 2016-09-16 NOTE — ED Notes (Signed)
Pt sleeping when RN entered room.  Easily aroused.  Pt continues to slur words slightly but is able to articulate enough to be understood.  Discussed events of his night and plan of care moving forward after discharge.  Pt admits to taking valium and snorting cocaine last night.  Offered patient resource information.  Pt states "books aren't going to help me, I need some medicine to take home."  Explained to patient emergency treatment provided to him in this stay and importance of followup with a qualified treatment center for rehab and support.  Pt verbalized understanding.  Pt called and spoke with sister for a ride home.  Pt awaiting ride home at this time.  Pt able to ambulate around room with no difficulty.

## 2016-09-16 NOTE — ED Notes (Signed)
Pt drinking coke. Still half asleep and pulling covers over his head.
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
## 2016-09-16 NOTE — ED Notes (Signed)
Pt restless moving around in the bed; states no one can come get him until day light; pt placed on cardiac monitor and pulse ox for monitoring; pt is alert with slurred speech

## 2016-09-16 NOTE — ED Notes (Signed)
Pt given soda to drink and urinal given also. Pt currently attempting to provide a urine sample.

## 2016-09-16 NOTE — ED Notes (Signed)
On discharge pt wasn't arousable to stimuli, narcan given; pt woke up alert, slurred speech, called his wife to pick him up but she has no vehicle states he needs to call his sister; pt attempted twice

## 2016-09-16 NOTE — Discharge Instructions (Addendum)
To find a primary care or specialty doctor please call [phone] or [phone] to access "[facility] Find a Doctor Service." ° °You may also go on the [facility] website at [email or web address] ° °There are also multiple [facility], [facility] and [facility] practices throughout [place] that are frequently accepting new patients. You may find a clinic that is close to your home and contact them. ° ° [facility] -  °[address] °[place] [address] °[phone] ° ° °[facility] -  °[address] °[place] [address] °[phone] ° ° °[facility] - °[address]  °[place] [address] °[phone] ° ° °

## 2017-11-06 ENCOUNTER — Emergency Department (HOSPITAL_COMMUNITY): Payer: Medicaid Other

## 2017-11-06 ENCOUNTER — Emergency Department (HOSPITAL_COMMUNITY)
Admission: EM | Admit: 2017-11-06 | Discharge: 2017-11-07 | Disposition: A | Discharge status: Home / Self Care | Payer: Medicaid Other | Attending: Emergency Medicine | Admitting: Emergency Medicine

## 2017-11-06 DIAGNOSIS — R4182 Altered mental status, unspecified: Secondary | ICD-10-CM | POA: Diagnosis present

## 2017-11-06 DIAGNOSIS — F1910 Other psychoactive substance abuse, uncomplicated: Secondary | ICD-10-CM | POA: Insufficient documentation

## 2017-11-06 DIAGNOSIS — F17210 Nicotine dependence, cigarettes, uncomplicated: Secondary | ICD-10-CM | POA: Diagnosis not present

## 2017-11-06 DIAGNOSIS — F1990 Other psychoactive substance use, unspecified, uncomplicated: Secondary | ICD-10-CM | POA: Insufficient documentation

## 2017-11-06 DIAGNOSIS — E162 Hypoglycemia, unspecified: Secondary | ICD-10-CM | POA: Diagnosis not present

## 2017-11-06 DIAGNOSIS — L03115 Cellulitis of right lower limb: Secondary | ICD-10-CM | POA: Diagnosis not present

## 2017-11-06 DIAGNOSIS — T50904A Poisoning by unspecified drugs, medicaments and biological substances, undetermined, initial encounter: Secondary | ICD-10-CM

## 2017-11-06 LAB — COMPREHENSIVE METABOLIC PANEL
ALT: 48 U/L (ref 17–63)
AST: 74 U/L — ABNORMAL HIGH (ref 15–41)
Albumin: 3.9 g/dL (ref 3.5–5.0)
Alkaline Phosphatase: 66 U/L (ref 38–126)
Anion gap: 11 (ref 5–15)
BUN: 20 mg/dL (ref 6–20)
CHLORIDE: 100 mmol/L — AB (ref 101–111)
CO2: 23 mmol/L (ref 22–32)
CREATININE: 0.76 mg/dL (ref 0.61–1.24)
Calcium: 8.4 mg/dL — ABNORMAL LOW (ref 8.9–10.3)
GFR calc non Af Amer: >60 mL/min (ref >60)
Glucose, Bld: 71 mg/dL (ref 65–99)
Potassium: 3.5 mmol/L (ref 3.5–5.1)
SODIUM: 134 mmol/L — AB (ref 135–145)
Total Bilirubin: 1.2 mg/dL (ref 0.3–1.2)
Total Protein: 6.8 g/dL (ref 6.5–8.1)

## 2017-11-06 LAB — I-STAT CHEM 8, ED
BUN: 18 mg/dL (ref 6–20)
CALCIUM ION: 1.17 mmol/L (ref 1.15–1.40)
Chloride: 99 mmol/L — ABNORMAL LOW (ref 101–111)
Creatinine, Ser: 0.6 mg/dL — ABNORMAL LOW (ref 0.61–1.24)
Glucose, Bld: 70 mg/dL (ref 65–99)
HCT: 37 % — ABNORMAL LOW (ref 39.0–52.0)
Hemoglobin: 12.6 g/dL — ABNORMAL LOW (ref 13.0–17.0)
Potassium: 3.4 mmol/L — ABNORMAL LOW (ref 3.5–5.1)
SODIUM: 136 mmol/L (ref 135–145)
TCO2: 24 mmol/L (ref 22–32)

## 2017-11-06 LAB — CBC WITH DIFFERENTIAL/PLATELET
BASOS ABS: 0.1 10*3/uL (ref 0.0–0.1)
Basophils Relative: 0 %
EOS ABS: 0.1 10*3/uL (ref 0.0–0.7)
EOS PCT: 1 %
HCT: 36.6 % — ABNORMAL LOW (ref 39.0–52.0)
Hemoglobin: 12.3 g/dL — ABNORMAL LOW (ref 13.0–17.0)
Lymphocytes Relative: 6 %
Lymphs Abs: 1 10*3/uL (ref 0.7–4.0)
MCH: 29 pg (ref 26.0–34.0)
MCHC: 33.6 g/dL (ref 30.0–36.0)
MCV: 86.3 fL (ref 78.0–100.0)
Monocytes Absolute: 1.5 10*3/uL — ABNORMAL HIGH (ref 0.1–1.0)
Monocytes Relative: 9 %
Neutro Abs: 14.7 10*3/uL — ABNORMAL HIGH (ref 1.7–7.7)
Neutrophils Relative %: 84 %
PLATELETS: 255 10*3/uL (ref 150–400)
RBC: 4.24 MIL/uL (ref 4.22–5.81)
RDW: 15.1 % (ref 11.5–15.5)
WBC: 17.3 10*3/uL — AB (ref 4.0–10.5)

## 2017-11-06 LAB — RAPID URINE DRUG SCREEN, HOSP PERFORMED
Amphetamines: POSITIVE — AB
BARBITURATES: NOT DETECTED
Benzodiazepines: POSITIVE — AB
Cocaine: POSITIVE — AB
Opiates: POSITIVE — AB
Tetrahydrocannabinol: POSITIVE — AB

## 2017-11-06 LAB — I-STAT TROPONIN, ED: TROPONIN I, POC: 0.01 ng/mL (ref 0.00–0.08)

## 2017-11-06 LAB — CBG MONITORING, ED
GLUCOSE-CAPILLARY: 53 mg/dL — AB (ref 65–99)
Glucose-Capillary: 136 mg/dL — ABNORMAL HIGH (ref 65–99)

## 2017-11-06 LAB — I-STAT CG4 LACTIC ACID, ED: Lactic Acid, Venous: 0.66 mmol/L (ref 0.5–1.9)

## 2017-11-06 LAB — ACETAMINOPHEN LEVEL

## 2017-11-06 LAB — SALICYLATE LEVEL

## 2017-11-06 LAB — ETHANOL: Alcohol, Ethyl (B): <10 mg/dL (ref <10)

## 2017-11-06 MED ORDER — THIAMINE HCL 100 MG/ML IJ SOLN
100.0000 mg | Freq: Once | INTRAMUSCULAR | Status: AC
  Administered 2017-11-06: 100 mg via INTRAVENOUS
  Filled 2017-11-06: qty 2

## 2017-11-06 MED ORDER — NALOXONE HCL 2 MG/2ML IJ SOSY
PREFILLED_SYRINGE | INTRAMUSCULAR | Status: AC
  Filled 2017-11-06: qty 2

## 2017-11-06 MED ORDER — DEXTROSE 50 % IV SOLN
50.0000 mL | Freq: Once | INTRAVENOUS | Status: AC
  Administered 2017-11-06: 50 mL via INTRAVENOUS
  Filled 2017-11-06: qty 50

## 2017-11-06 MED ORDER — METHOCARBAMOL 500 MG PO TABS
1000.0000 mg | ORAL_TABLET | Freq: Once | ORAL | Status: AC
  Administered 2017-11-06: 1000 mg via ORAL
  Filled 2017-11-06: qty 2

## 2017-11-06 MED ORDER — NALOXONE HCL 4 MG/0.1ML NA LIQD
1.0000 | Freq: Once | NASAL | Status: DC
  Filled 2017-11-06: qty 4

## 2017-11-06 MED ORDER — LIDOCAINE HCL (PF) 1 % IJ SOLN
5.0000 mL | Freq: Once | INTRAMUSCULAR | Status: AC
  Administered 2017-11-06: 5 mL via INTRADERMAL
  Filled 2017-11-06: qty 5

## 2017-11-06 MED ORDER — CLONIDINE HCL 0.1 MG/24HR TD PTWK
0.1000 mg | MEDICATED_PATCH | Freq: Once | TRANSDERMAL | Status: DC
  Administered 2017-11-06: 0.1 mg via TRANSDERMAL
  Filled 2017-11-06: qty 1

## 2017-11-06 MED ORDER — LORAZEPAM 2 MG/ML IJ SOLN
1.0000 mg | Freq: Once | INTRAMUSCULAR | Status: AC
  Administered 2017-11-06: 1 mg via INTRAVENOUS
  Filled 2017-11-06: qty 1

## 2017-11-06 MED ORDER — SODIUM CHLORIDE 0.9 % IV BOLUS (SEPSIS)
1000.0000 mL | Freq: Once | INTRAVENOUS | Status: AC
  Administered 2017-11-06: 1000 mL via INTRAVENOUS

## 2017-11-06 MED ORDER — SULFAMETHOXAZOLE-TRIMETHOPRIM 800-160 MG PO TABS: 1.0000 | ORAL_TABLET | Freq: Two times a day (BID) | ORAL | 0 refills | Status: DC

## 2017-11-06 MED ORDER — DEXTROSE 10 % IV SOLN
INTRAVENOUS | Status: DC
  Administered 2017-11-06: 16:00:00 via INTRAVENOUS
  Filled 2017-11-06: qty 1000

## 2017-11-06 MED ORDER — CLINDAMYCIN PHOSPHATE 900 MG/50ML IV SOLN
900.0000 mg | Freq: Once | INTRAVENOUS | Status: AC
  Administered 2017-11-06: 900 mg via INTRAVENOUS
  Filled 2017-11-06: qty 50
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
## 2017-11-06 NOTE — ED Notes (Signed)
Patient transported to X-ray 

## 2017-11-06 NOTE — ED Notes (Signed)
Attempted to collect urine sample via in and out cath, per physician, due to abnormal anatomy (hypospadias) will hold off for now on collecting specimen.

## 2017-11-06 NOTE — Discharge Instructions (Signed)
If you see signs of worsening infection (warmth, redness, tenderness, pus, sharp increase in pain, fever, red streaking) immediately return to the emergency department.  Please follow with your primary care doctor in the next 2 days for a check-up. They must obtain records for further management.   Do not hesitate to return to the Emergency Department for any new, worsening or concerning symptoms.

## 2017-11-06 NOTE — ED Notes (Signed)
Bed: WA15 Expected date:  Expected time:  Means of arrival:  Comments: EMS-OD 

## 2017-11-06 NOTE — ED Triage Notes (Signed)
[facility], friend called out and admitted that patient had possibly overdosed on heroin. Upon EMS arrival patient was acting very bizarre and uncooperative. 5 mg of Versed given IM PTA. D10 was also administered PTA due to a CBG reading of 65 mg/dl by [facility]. Patient responsive to verbal direction and pain upon arrival.

## 2017-11-06 NOTE — ED Notes (Signed)
Security and [employer] at bedside with patient upon arrival.

## 2017-11-06 NOTE — ED Notes (Signed)
Patient transported to CT 
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
## 2017-11-07 ENCOUNTER — Emergency Department (HOSPITAL_COMMUNITY): Payer: Medicaid Other

## 2017-11-07 ENCOUNTER — Encounter (HOSPITAL_COMMUNITY): Payer: Self-pay

## 2017-11-07 ENCOUNTER — Other Ambulatory Visit: Payer: Self-pay

## 2017-11-07 ENCOUNTER — Inpatient Hospital Stay (HOSPITAL_COMMUNITY)
Admission: EM | Admit: 2017-11-07 | Discharge: 2017-11-09 | DRG: 871 | Discharge status: Against Medical Advice | Payer: Medicaid Other | Attending: Internal Medicine | Admitting: Internal Medicine

## 2017-11-07 DIAGNOSIS — F17210 Nicotine dependence, cigarettes, uncomplicated: Secondary | ICD-10-CM | POA: Diagnosis present

## 2017-11-07 DIAGNOSIS — A419 Sepsis, unspecified organism: Secondary | ICD-10-CM | POA: Diagnosis present

## 2017-11-07 DIAGNOSIS — M6282 Rhabdomyolysis: Secondary | ICD-10-CM | POA: Diagnosis present

## 2017-11-07 DIAGNOSIS — L03115 Cellulitis of right lower limb: Secondary | ICD-10-CM

## 2017-11-07 DIAGNOSIS — R319 Hematuria, unspecified: Secondary | ICD-10-CM | POA: Diagnosis present

## 2017-11-07 DIAGNOSIS — F1910 Other psychoactive substance abuse, uncomplicated: Secondary | ICD-10-CM | POA: Diagnosis not present

## 2017-11-07 DIAGNOSIS — F1920 Other psychoactive substance dependence, uncomplicated: Secondary | ICD-10-CM | POA: Diagnosis present

## 2017-11-07 DIAGNOSIS — E876 Hypokalemia: Secondary | ICD-10-CM

## 2017-11-07 DIAGNOSIS — G92 Toxic encephalopathy: Secondary | ICD-10-CM | POA: Diagnosis present

## 2017-11-07 DIAGNOSIS — J45909 Unspecified asthma, uncomplicated: Secondary | ICD-10-CM | POA: Diagnosis present

## 2017-11-07 DIAGNOSIS — Z5321 Procedure and treatment not carried out due to patient leaving prior to being seen by health care provider: Secondary | ICD-10-CM | POA: Diagnosis not present

## 2017-11-07 DIAGNOSIS — L0390 Cellulitis, unspecified: Secondary | ICD-10-CM

## 2017-11-07 DIAGNOSIS — F1410 Cocaine abuse, uncomplicated: Secondary | ICD-10-CM | POA: Diagnosis present

## 2017-11-07 LAB — URINALYSIS, ROUTINE W REFLEX MICROSCOPIC
Bacteria, UA: NONE SEEN
Bilirubin Urine: NEGATIVE
GLUCOSE, UA: NEGATIVE mg/dL
KETONES UR: 5 mg/dL — AB
LEUKOCYTES UA: NEGATIVE
Nitrite: NEGATIVE
PH: 5 (ref 5.0–8.0)
Protein, ur: NEGATIVE mg/dL
SQUAMOUS EPITHELIAL / LPF: NONE SEEN
Specific Gravity, Urine: 1.012 (ref 1.005–1.030)

## 2017-11-07 LAB — COMPREHENSIVE METABOLIC PANEL
ALBUMIN: 3.2 g/dL — AB (ref 3.5–5.0)
ALK PHOS: 52 U/L (ref 38–126)
ALT: 39 U/L (ref 17–63)
AST: 59 U/L — AB (ref 15–41)
Anion gap: 6 (ref 5–15)
BILIRUBIN TOTAL: 0.9 mg/dL (ref 0.3–1.2)
BUN: 15 mg/dL (ref 6–20)
CALCIUM: 7.8 mg/dL — AB (ref 8.9–10.3)
CO2: 24 mmol/L (ref 22–32)
CREATININE: 0.6 mg/dL — AB (ref 0.61–1.24)
Chloride: 103 mmol/L (ref 101–111)
GFR calc Af Amer: >60 mL/min (ref >60)
GFR calc non Af Amer: >60 mL/min (ref >60)
GLUCOSE: 120 mg/dL — AB (ref 65–99)
Potassium: 3 mmol/L — ABNORMAL LOW (ref 3.5–5.1)
Sodium: 133 mmol/L — ABNORMAL LOW (ref 135–145)
TOTAL PROTEIN: 5.9 g/dL — AB (ref 6.5–8.1)

## 2017-11-07 LAB — CBC WITH DIFFERENTIAL/PLATELET
BASOS ABS: 0 10*3/uL (ref 0.0–0.1)
BASOS PCT: 0 %
EOS ABS: 0.1 10*3/uL (ref 0.0–0.7)
EOS PCT: 1 %
HCT: 33.1 % — ABNORMAL LOW (ref 39.0–52.0)
Hemoglobin: 11.1 g/dL — ABNORMAL LOW (ref 13.0–17.0)
LYMPHS PCT: 13 %
Lymphs Abs: 1.9 10*3/uL (ref 0.7–4.0)
MCH: 28.8 pg (ref 26.0–34.0)
MCHC: 33.5 g/dL (ref 30.0–36.0)
MCV: 86 fL (ref 78.0–100.0)
Monocytes Absolute: 1.6 10*3/uL — ABNORMAL HIGH (ref 0.1–1.0)
Monocytes Relative: 11 %
Neutro Abs: 10.9 10*3/uL — ABNORMAL HIGH (ref 1.7–7.7)
Neutrophils Relative %: 75 %
PLATELETS: 218 10*3/uL (ref 150–400)
RBC: 3.85 MIL/uL — ABNORMAL LOW (ref 4.22–5.81)
RDW: 15.4 % (ref 11.5–15.5)
WBC: 14.5 10*3/uL — ABNORMAL HIGH (ref 4.0–10.5)

## 2017-11-07 LAB — CREATININE, SERUM
Creatinine, Ser: 0.54 mg/dL — ABNORMAL LOW (ref 0.61–1.24)
GFR calc Af Amer: >60 mL/min (ref >60)
GFR calc non Af Amer: >60 mL/min (ref >60)

## 2017-11-07 LAB — RAPID URINE DRUG SCREEN, HOSP PERFORMED
Amphetamines: POSITIVE — AB
BARBITURATES: NOT DETECTED
BENZODIAZEPINES: POSITIVE — AB
COCAINE: POSITIVE — AB
Opiates: POSITIVE — AB
TETRAHYDROCANNABINOL: POSITIVE — AB

## 2017-11-07 LAB — CBC
HCT: 30.9 % — ABNORMAL LOW (ref 39.0–52.0)
HEMOGLOBIN: 10.4 g/dL — AB (ref 13.0–17.0)
MCH: 29.3 pg (ref 26.0–34.0)
MCHC: 33.7 g/dL (ref 30.0–36.0)
MCV: 87 fL (ref 78.0–100.0)
Platelets: 193 10*3/uL (ref 150–400)
RBC: 3.55 MIL/uL — AB (ref 4.22–5.81)
RDW: 15.4 % (ref 11.5–15.5)
WBC: 13.6 10*3/uL — ABNORMAL HIGH (ref 4.0–10.5)

## 2017-11-07 LAB — CK: Total CK: 1121 U/L — ABNORMAL HIGH (ref 49–397)

## 2017-11-07 LAB — ACETAMINOPHEN LEVEL: Acetaminophen (Tylenol), Serum: <10 ug/mL — ABNORMAL LOW (ref 10–30)

## 2017-11-07 LAB — SALICYLATE LEVEL

## 2017-11-07 LAB — ETHANOL: Alcohol, Ethyl (B): <10 mg/dL (ref <10)

## 2017-11-07 LAB — I-STAT CG4 LACTIC ACID, ED
Lactic Acid, Venous: 0.98 mmol/L (ref 0.5–1.9)
Lactic Acid, Venous: 0.52 mmol/L (ref 0.5–1.9)

## 2017-11-07 LAB — HIV ANTIBODY (ROUTINE TESTING W REFLEX): HIV Screen 4th Generation wRfx: NONREACTIVE

## 2017-11-07 MED ORDER — ACETAMINOPHEN 650 MG RE SUPP: 650.0000 mg | Freq: Four times a day (QID) | RECTAL | Status: DC | PRN

## 2017-11-07 MED ORDER — HEPARIN SODIUM (PORCINE) 5000 UNIT/ML IJ SOLN
5000.0000 [IU] | Freq: Three times a day (TID) | INTRAMUSCULAR | Status: DC
  Administered 2017-11-07 – 2017-11-08 (×4): 5000 [IU] via SUBCUTANEOUS
  Filled 2017-11-07 (×6): qty 1

## 2017-11-07 MED ORDER — KETOROLAC TROMETHAMINE 30 MG/ML IJ SOLN
30.0000 mg | Freq: Four times a day (QID) | INTRAMUSCULAR | Status: DC | PRN
  Administered 2017-11-07 – 2017-11-08 (×2): 30 mg via INTRAVENOUS
  Filled 2017-11-07 (×3): qty 1

## 2017-11-07 MED ORDER — SODIUM CHLORIDE 0.9 % IV BOLUS (SEPSIS)
1000.0000 mL | Freq: Once | INTRAVENOUS | Status: AC
  Administered 2017-11-07: 1000 mL via INTRAVENOUS

## 2017-11-07 MED ORDER — VANCOMYCIN HCL IN DEXTROSE 1-5 GM/200ML-% IV SOLN
1000.0000 mg | Freq: Once | INTRAVENOUS | Status: AC
  Administered 2017-11-07: 1000 mg via INTRAVENOUS
  Filled 2017-11-07: qty 200

## 2017-11-07 MED ORDER — POTASSIUM CHLORIDE 10 MEQ/100ML IV SOLN
10.0000 meq | Freq: Once | INTRAVENOUS | Status: AC
  Administered 2017-11-07: 10 meq via INTRAVENOUS

## 2017-11-07 MED ORDER — VANCOMYCIN HCL IN DEXTROSE 750-5 MG/150ML-% IV SOLN
750.0000 mg | Freq: Three times a day (TID) | INTRAVENOUS | Status: DC
  Administered 2017-11-07 – 2017-11-08 (×3): 750 mg via INTRAVENOUS
  Filled 2017-11-07 (×5): qty 150

## 2017-11-07 MED ORDER — PIPERACILLIN-TAZOBACTAM 3.375 G IVPB
3.3750 g | Freq: Three times a day (TID) | INTRAVENOUS | Status: DC
  Administered 2017-11-07 – 2017-11-08 (×4): 3.375 g via INTRAVENOUS
  Filled 2017-11-07 (×6): qty 50

## 2017-11-07 MED ORDER — HYDROXYZINE HCL 25 MG PO TABS
25.0000 mg | ORAL_TABLET | Freq: Four times a day (QID) | ORAL | Status: DC | PRN
  Administered 2017-11-07 – 2017-11-08 (×3): 25 mg via ORAL
  Filled 2017-11-07 (×3): qty 1

## 2017-11-07 MED ORDER — ACETAMINOPHEN 325 MG PO TABS
650.0000 mg | ORAL_TABLET | Freq: Four times a day (QID) | ORAL | Status: DC | PRN
  Administered 2017-11-07 (×2): 650 mg via ORAL
  Filled 2017-11-07 (×2): qty 2

## 2017-11-07 MED ORDER — POTASSIUM CHLORIDE 10 MEQ/100ML IV SOLN
10.0000 meq | INTRAVENOUS | Status: AC
  Administered 2017-11-07 (×3): 10 meq via INTRAVENOUS
  Filled 2017-11-07 (×4): qty 100

## 2017-11-07 MED ORDER — PIPERACILLIN-TAZOBACTAM 3.375 G IVPB 30 MIN
3.3750 g | Freq: Once | INTRAVENOUS | Status: AC
  Administered 2017-11-07: 3.375 g via INTRAVENOUS
  Filled 2017-11-07: qty 50

## 2017-11-07 MED ORDER — KETOROLAC TROMETHAMINE 15 MG/ML IJ SOLN
15.0000 mg | Freq: Once | INTRAMUSCULAR | Status: AC
  Administered 2017-11-07: 15 mg via INTRAVENOUS
  Filled 2017-11-07: qty 1

## 2017-11-07 NOTE — ED Triage Notes (Signed)
Pt arrived via [facility], with a temperature of 105.1 Pt seen earlier today due to overdose. Pt currently lethargic, will not respond to voice. 5mg  IM Midazolam given en voute.

## 2017-11-07 NOTE — Plan of Care (Signed)
Mr. Yetta BarreJones is a 31 year old male with pmh asthma, polysubstance abuse(UDS+ amphetamines, cocaine, THC, opiates, benzos on 12/4), and mood disorder; who presented with complaints of fever.  Just last seen in ED yesterday for heroin overdose found to have cellulitis and abscess of right foot.  He underwent I&D and was initially given IV clindamycin.  Patient adamantly denied any suicidal ideation and was ultimately discharged home with prescription for Bactrim.  Patient represented to the hospital with up to 101.6, tachycardia, and tachypnea. WBC 14.5, but lactic reassuring.  Patient had been given 5 mg  Versed in route due to agitation by EMS.  Sepsis protocol was initiated and pt placed on empiric antibiotics of vancomycin and Zosyn.  [facility] called to admit for likely need of continued IV antibiotics.

## 2017-11-07 NOTE — ED Provider Notes (Signed)
[facility]-EMERGENCY DEPT Provider Note   CSN: [id number] Arrival date & time: 11/07/17  0350     History   Chief Complaint Chief Complaint  Patient presents with  . Fever  Level 5 caveat due to altered mental status  HPI Jason Buchanan is a 31 y.o. male.  The history is provided by the patient and the EMS personnel. The history is limited by the condition of the patient.  Fever   This is a new problem. The problem occurs constantly. The problem has been gradually worsening. He has tried nothing for the symptoms.  Patient presents emergency department for fever and altered mental status Apparently he was just in the emergency department for overdose and was discharged EMS was called to bring to the emergency department tonight and then he noted he had a fever, he was altered and  was given Versed No other details are noted on arrival  Past Medical History:  Diagnosis Date  . Asthma   . Mood disorder (HCC)   . Polysubstance abuse (HCC)     There are no active problems to display for this patient.   Past Surgical History:  Procedure Laterality Date  . ANKLE SURGERY    . FRACTURE SURGERY    . JOINT REPLACEMENT         Home Medications    Prior to Admission medications   Medication Sig Start Date End Date Taking? Authorizing Provider  diazepam (VALIUM) 5 MG tablet Take 1 tablet (5 mg total) by mouth every 6 (six) hours as needed for anxiety (spasms). 06/04/13   Otis Braceabbani, Marjan, MD  ibuprofen (ADVIL,MOTRIN) 800 MG tablet Take 1 tablet (800 mg total) by mouth 3 (three) times daily. 01/07/16   Elpidio AnisUpstill, Shari, PA-C  methocarbamol (ROBAXIN) 500 MG tablet Take 1 tablet (500 mg total) by mouth every 8 (eight) hours as needed for muscle spasms. 01/07/16   Elpidio AnisUpstill, Shari, PA-C  naproxen (NAPROSYN) 500 MG tablet Take 1 tablet (500 mg total) by mouth 2 (two) times daily with a meal. 11/05/15   Garlon HatchetSanders, Lisa M, PA-C  ondansetron (ZOFRAN ODT) 4 MG disintegrating  tablet 4mg  ODT q4 hours prn nausea/vomit 06/04/13   Rabbani, Glendora ScoreMarjan, MD  oxyCODONE-acetaminophen (PERCOCET) 5-325 MG per tablet Take 1-2 tablets by mouth every 6 (six) hours as needed for pain. 06/04/13   Otis Braceabbani, Marjan, MD  sulfamethoxazole-trimethoprim (BACTRIM DS) 800-160 MG tablet Take 1 tablet by mouth 2 (two) times daily. 11/06/17   Pisciotta, Mardella LaymanNicole, PA-C    Family History No family history on file.  Social History Social History   Tobacco Use  . Smoking status: Current Every Day Smoker    Packs/day: 0.50    Types: Cigarettes  . Smokeless tobacco: Never Used  Substance Use Topics  . Alcohol use: Yes    Comment: occasionally  . Drug use: Yes    Types: IV    Comment: heroin, meth     Allergies   Patient has no known allergies.   Review of Systems Review of Systems  Unable to perform ROS: Mental status change  Constitutional: Positive for fever.     Physical Exam Updated Vital Signs Temp (!) 101.6 F (38.7 C) (Rectal)   SpO2 99%   Physical Exam CONSTITUTIONAL: Patient is ill-appearing, sleepy on arrival HEAD: Normocephalic/atraumatic EYES: EOMI/PERRL ENMT: Mucous membranes dry NECK: supple no meningeal signs SPINE/BACK:entire spine nontender CV: S1/S2 noted, no murmurs/rubs/gallops noted no loud harsh murmurs LUNGS: Lungs are clear to auscultation bilaterally, no apparent  distress ABDOMEN: soft, nontender GU:no cva tenderness NEURO: Pt is somnolent, arouses to voice and pain, moves all extremities x4 EXTREMITIES: pulses normal/equal, see photo No crepitus noted to right foot No evidence of cellulitis or abscess to arms or left leg SKIN: warm, color normal, see photo PSYCH:anxious      ED Treatments / Results  Labs (all labs ordered are listed, but only abnormal results are displayed) Labs Reviewed  COMPREHENSIVE METABOLIC PANEL - Abnormal; Notable for the following components:      Result Value   Sodium 133 (*)    Potassium 3.0 (*)    Glucose,  Bld 120 (*)    Creatinine, Ser 0.60 (*)    Calcium 7.8 (*)    Total Protein 5.9 (*)    Albumin 3.2 (*)    AST 59 (*)    All other components within normal limits  CBC WITH DIFFERENTIAL/PLATELET - Abnormal; Notable for the following components:   WBC 14.5 (*)    RBC 3.85 (*)    Hemoglobin 11.1 (*)    HCT 33.1 (*)    Neutro Abs 10.9 (*)    Monocytes Absolute 1.6 (*)    All other components within normal limits  URINALYSIS, ROUTINE W REFLEX MICROSCOPIC - Abnormal; Notable for the following components:   Hgb urine dipstick MODERATE (*)    Ketones, ur 5 (*)    All other components within normal limits  CK - Abnormal; Notable for the following components:   Total CK 1,121 (*)    All other components within normal limits  ACETAMINOPHEN LEVEL - Abnormal; Notable for the following components:   Acetaminophen (Tylenol), Serum <10 (*)    All other components within normal limits  CULTURE, BLOOD (ROUTINE X 2)  CULTURE, BLOOD (ROUTINE X 2)  ETHANOL  SALICYLATE LEVEL  RAPID URINE DRUG SCREEN, HOSP PERFORMED  I-STAT CG4 LACTIC ACID, ED  I-STAT CG4 LACTIC ACID, ED    EKG  EKG Interpretation None       Radiology Ct Head Wo Contrast  Result Date: 11/06/2017 CLINICAL DATA:  Altered level of consciousness EXAM: CT HEAD WITHOUT CONTRAST TECHNIQUE: Contiguous axial images were obtained from the base of the skull through the vertex without intravenous contrast. COMPARISON:  None. FINDINGS: Brain: No evidence of acute infarction, hemorrhage, hydrocephalus, extra-axial collection or mass lesion/mass effect. Vascular: No hyperdense vessel or unexpected calcification. Skull: No osseous abnormality. Sinuses/Orbits: Visualized paranasal sinuses are clear. Visualized mastoid sinuses are clear. Visualized orbits demonstrate no focal abnormality. Other: None IMPRESSION: No acute intracranial pathology. Electronically Signed   By: Elige KoHetal  Patel   On: 11/06/2017 16:53   Dg Chest Port 1 View  Result  Date: 11/07/2017 CLINICAL DATA:  31 y/o  M; fever and unresponsiveness. EXAM: PORTABLE CHEST 1 VIEW COMPARISON:  01/18/2015 chest radiograph FINDINGS: Stable heart size and mediastinal contours are within normal limits. Both lungs are clear. The visualized skeletal structures are unremarkable. IMPRESSION: No active disease. Electronically Signed   By: Mitzi HansenLance  Furusawa-Stratton M.D.   On: 11/07/2017 04:47   Dg Foot Complete Right  Result Date: 11/06/2017 CLINICAL DATA:  RIGHT foot pain and swelling at IV injection site EXAM: RIGHT FOOT COMPLETE - 3+ VIEW COMPARISON:  12/19/2010 RIGHT foot radiographs, RIGHT ankle radiographs 11/05/2015 FINDINGS: Osseous demineralization. Joint spaces within the foot preserved. No acute fracture, dislocation or bone destruction. Well-circumscribed lucent focus in posterior calcaneus corresponds to prior external fixator site. Plate and multiple screws identified at the distal tibia with fracture of the  distal lateral to medial screw. Scattered mild soft tissue swelling. IMPRESSION: No acute osseous abnormalities. Prior distal tibial ORIF with fracture of a lateral to medial screw again seen. Electronically Signed   By: Ulyses Southward M.D.   On: 11/06/2017 21:08    Procedures Procedures  CRITICAL CARE Performed by: Joya Gaskins Total critical care time: 35 minutes Critical care time was exclusive of separately billable procedures and treating other patients. Critical care was necessary to treat or prevent imminent or life-threatening deterioration. Critical care was time spent personally by me on the following activities: development of treatment plan with patient and/or surrogate as well as nursing, discussions with consultants, evaluation of patient's response to treatment, examination of patient, obtaining history from patient or surrogate, ordering and performing treatments and interventions, ordering and review of laboratory studies, ordering and review of  radiographic studies, pulse oximetry and re-evaluation of patient's condition. Patient with sepsis, requiring IV fluids and IV antibiotics, with altered mental status requiring admission  Medications Ordered in ED Medications  potassium chloride 10 mEq in 100 mL IVPB (not administered)  sodium chloride 0.9 % bolus 1,000 mL (0 mLs Intravenous Stopped 11/07/17 0438)    And  sodium chloride 0.9 % bolus 1,000 mL (0 mLs Intravenous Stopped 11/07/17 0512)  piperacillin-tazobactam (ZOSYN) IVPB 3.375 g (0 g Intravenous Stopped 11/07/17 0448)  vancomycin (VANCOCIN) IVPB 1000 mg/200 mL premix (0 mg Intravenous Stopped 11/07/17 0542)  ketorolac (TORADOL) 15 MG/ML injection 15 mg (15 mg Intravenous Given 11/07/17 0458)     Initial Impression / Assessment and Plan / ED Course  I have reviewed the triage vital signs and the nursing notes.  Pertinent labs & imaging results that were available during my care of the patient were reviewed by me and considered in my medical decision making (see chart for details).     4:33 AM Patient seen emergency department for fever and altered mental status His source is his right foot which he has evidence of cellulitis He had an I&D done of an abscess of his right foot on previous ED visit Code sepsis called IV fluids and IV antibiotics started We will follow closely 6:08 AM Patient resting comfortably but easily arousable Vitals appropriate Patient will need to be admitted for IV antibiotics due to cellulitis with sepsis Discussed with Dr. Madelyn Flavors for admission Patient also noted to have mild rhabdo, will need IV fluids Final Clinical Impressions(s) / ED Diagnoses   Final diagnoses:  Sepsis, due to unspecified organism [facility])  Cellulitis of right foot    ED Discharge Orders    None       Zadie Rhine, MD 11/07/17 [phone]

## 2017-11-07 NOTE — ED Notes (Signed)
Sister here to pick up patient.
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
## 2017-11-07 NOTE — ED Notes (Signed)
Spoke with patient's sister who states she will come and pick patient up

## 2017-11-07 NOTE — ED Notes (Signed)
Waiting on pharmacy for next potassium run. Out of stock in ED pyxis.

## 2017-11-07 NOTE — Progress Notes (Signed)
Pharmacy Antibiotic Note  Jason SkeensMichael D Buchanan is a 31 y.o. male admitted on 11/07/2017 with sepsis secondary to worsening right foot cellulitis.  Pharmacy has been consulted for Vanc/Zosyn dosing.  Plan: 1) Vancomycin 1g x 1 then 750mg  IV q8 (goal AUC 400-500) 2) Zosyn 3.375g IV q8 (extended interval infusion)   Height: 5\' 5"  (165.1 cm) Weight: 131 lb 11.2 oz (59.7 kg) IBW/kg (Calculated) : 61.5  Temp (24hrs), Avg:99.4 F (37.4 C), Min:97.9 F (36.6 C), Max:101.6 F (38.7 C)  Recent Labs  Lab 11/06/17 1503 11/06/17 1518 11/06/17 1828 11/07/17 0405 11/07/17 0420 11/07/17 0800 11/07/17 0819  WBC 17.3*  --   --  14.5*  --  13.6*  --   CREATININE 0.76 0.60*  --  0.60*  --   --   --   LATICACIDVEN  --   --  0.66  --  0.98  --  0.52    Estimated Creatinine Clearance: 113 mL/min (A) (by C-G formula based on SCr of 0.6 mg/dL (L)).    No Known Allergies   Thank you for allowing pharmacy to be a part of this patient's care.   Hessie KnowsJustin M Laylia Mui, PharmD, BCPS Pager [phone] 11/07/2017 8:31 AM

## 2017-11-08 ENCOUNTER — Inpatient Hospital Stay (HOSPITAL_COMMUNITY): Payer: Medicaid Other

## 2017-11-08 LAB — COMPREHENSIVE METABOLIC PANEL
ALBUMIN: 2.6 g/dL — AB (ref 3.5–5.0)
ALK PHOS: 45 U/L (ref 38–126)
ALT: 29 U/L (ref 17–63)
ANION GAP: 4 — AB (ref 5–15)
AST: 31 U/L (ref 15–41)
BUN: 8 mg/dL (ref 6–20)
CALCIUM: 7.7 mg/dL — AB (ref 8.9–10.3)
CHLORIDE: 112 mmol/L — AB (ref 101–111)
CO2: 25 mmol/L (ref 22–32)
Creatinine, Ser: 0.42 mg/dL — ABNORMAL LOW (ref 0.61–1.24)
GFR calc Af Amer: >60 mL/min (ref >60)
GFR calc non Af Amer: >60 mL/min (ref >60)
GLUCOSE: 107 mg/dL — AB (ref 65–99)
POTASSIUM: 3.1 mmol/L — AB (ref 3.5–5.1)
SODIUM: 141 mmol/L (ref 135–145)
Total Bilirubin: 0.6 mg/dL (ref 0.3–1.2)
Total Protein: 5.3 g/dL — ABNORMAL LOW (ref 6.5–8.1)

## 2017-11-08 LAB — CBC
HEMATOCRIT: 32.7 % — AB (ref 39.0–52.0)
HEMOGLOBIN: 10.8 g/dL — AB (ref 13.0–17.0)
MCH: 29 pg (ref 26.0–34.0)
MCHC: 33 g/dL (ref 30.0–36.0)
MCV: 87.7 fL (ref 78.0–100.0)
Platelets: 183 10*3/uL (ref 150–400)
RBC: 3.73 MIL/uL — ABNORMAL LOW (ref 4.22–5.81)
RDW: 15.5 % (ref 11.5–15.5)
WBC: 10.2 10*3/uL (ref 4.0–10.5)

## 2017-11-08 LAB — MAGNESIUM: Magnesium: 1.7 mg/dL (ref 1.7–2.4)

## 2017-11-08 LAB — SEDIMENTATION RATE: Sed Rate: 10 mm/hr (ref 0–16)

## 2017-11-08 LAB — CBG MONITORING, ED

## 2017-11-08 MED ORDER — CLONIDINE HCL 0.1 MG PO TABS: 0.1000 mg | ORAL_TABLET | ORAL | Status: DC

## 2017-11-08 MED ORDER — DICYCLOMINE HCL 20 MG PO TABS
20.0000 mg | ORAL_TABLET | Freq: Four times a day (QID) | ORAL | Status: DC | PRN
  Filled 2017-11-08: qty 1

## 2017-11-08 MED ORDER — KETOROLAC TROMETHAMINE 30 MG/ML IJ SOLN
30.0000 mg | Freq: Four times a day (QID) | INTRAMUSCULAR | Status: DC | PRN
Start: 2017-11-08 — End: 2017-11-09
  Administered 2017-11-08 – 2017-11-09 (×3): 30 mg via INTRAVENOUS
  Filled 2017-11-08 (×3): qty 1

## 2017-11-08 MED ORDER — IOPAMIDOL (ISOVUE-300) INJECTION 61%
100.0000 mL | Freq: Once | INTRAVENOUS | Status: AC | PRN
  Administered 2017-11-08: 100 mL via INTRAVENOUS

## 2017-11-08 MED ORDER — METRONIDAZOLE 500 MG PO TABS
500.0000 mg | ORAL_TABLET | Freq: Three times a day (TID) | ORAL | Status: DC
  Administered 2017-11-08 – 2017-11-09 (×3): 500 mg via ORAL
  Filled 2017-11-08 (×3): qty 1

## 2017-11-08 MED ORDER — ONDANSETRON 4 MG PO TBDP
4.0000 mg | ORAL_TABLET | Freq: Four times a day (QID) | ORAL | Status: DC | PRN
  Administered 2017-11-08: 4 mg via ORAL
  Filled 2017-11-08: qty 1

## 2017-11-08 MED ORDER — ACETAMINOPHEN 500 MG PO TABS
1000.0000 mg | ORAL_TABLET | Freq: Three times a day (TID) | ORAL | Status: DC
  Administered 2017-11-08 – 2017-11-09 (×3): 1000 mg via ORAL
  Filled 2017-11-08 (×3): qty 2

## 2017-11-08 MED ORDER — METHOCARBAMOL 500 MG PO TABS
500.0000 mg | ORAL_TABLET | Freq: Three times a day (TID) | ORAL | Status: DC | PRN
  Administered 2017-11-08 – 2017-11-09 (×3): 500 mg via ORAL
  Filled 2017-11-08 (×4): qty 1

## 2017-11-08 MED ORDER — IOPAMIDOL (ISOVUE-300) INJECTION 61%
INTRAVENOUS | Status: AC
  Filled 2017-11-08: qty 100

## 2017-11-08 MED ORDER — HYDROXYZINE HCL 25 MG PO TABS
25.0000 mg | ORAL_TABLET | Freq: Four times a day (QID) | ORAL | Status: DC | PRN
  Administered 2017-11-08 – 2017-11-09 (×3): 25 mg via ORAL
  Filled 2017-11-08 (×3): qty 1

## 2017-11-08 MED ORDER — CLONIDINE HCL 0.1 MG PO TABS: 0.1000 mg | ORAL_TABLET | Freq: Every day | ORAL | Status: DC

## 2017-11-08 MED ORDER — VANCOMYCIN HCL IN DEXTROSE 750-5 MG/150ML-% IV SOLN
750.0000 mg | Freq: Three times a day (TID) | INTRAVENOUS | Status: DC
  Administered 2017-11-08 – 2017-11-09 (×3): 750 mg via INTRAVENOUS
  Filled 2017-11-08 (×4): qty 150

## 2017-11-08 MED ORDER — DEXTROSE 5 % IV SOLN
2.0000 g | Freq: Two times a day (BID) | INTRAVENOUS | Status: DC
  Administered 2017-11-08 – 2017-11-09 (×2): 2 g via INTRAVENOUS
  Filled 2017-11-08 (×2): qty 2

## 2017-11-08 MED ORDER — LOPERAMIDE HCL 2 MG PO CAPS: 2.0000 mg | ORAL_CAPSULE | ORAL | Status: DC | PRN

## 2017-11-08 MED ORDER — CLONIDINE HCL 0.1 MG PO TABS
0.1000 mg | ORAL_TABLET | Freq: Four times a day (QID) | ORAL | Status: DC
  Administered 2017-11-08 – 2017-11-09 (×2): 0.1 mg via ORAL
  Filled 2017-11-08 (×2): qty 1

## 2017-11-08 MED ORDER — POTASSIUM CHLORIDE CRYS ER 20 MEQ PO TBCR
40.0000 meq | EXTENDED_RELEASE_TABLET | ORAL | Status: AC
  Administered 2017-11-08 (×2): 40 meq via ORAL
  Filled 2017-11-08 (×2): qty 2

## 2017-11-08 NOTE — Progress Notes (Signed)
Pharmacy Antibiotic Note  Jason Buchanan is a 31 y.o. male with polysubstance abuse and mood disorder presented to the ED on 11/07/2017 with AMS and and ankle/foot cellulitis.  He had  I&D performed with scant amount of bloody drainage with recent ED admission on 12/4.  Vancomycin and zosyn were started on admission for cellultis/sepsis.  Zosyn d/ced on 12/6 and changed to cefepime and flagyl.  Today, 11/08/2017: - Tmax 100.6, wbc down wnl - scr low (crcl~100) - 12/4 CXR: neg - 12/4 foot xray: no osteo noted  Plan: - cefepime 2gm IV q12h for cellulitis - continue vancomycin 750 mg IV q8h - flagyl 500 mg IV q8h _______________________________________  Height: 5\' 5"  (165.1 cm) Weight: 131 lb 11.2 oz (59.7 kg) IBW/kg (Calculated) : 61.5  Temp (24hrs), Avg:99.1 F (37.3 C), Min:97.4 F (36.3 C), Max:100.1 F (37.8 C)  Recent Labs  Lab 11/06/17 1503 11/06/17 1518 11/06/17 1828 11/07/17 0405 11/07/17 0420 11/07/17 0800 11/07/17 0819 11/08/17 0606  WBC 17.3*  --   --  14.5*  --  13.6*  --  10.2  CREATININE 0.76 0.60*  --  0.60*  --  0.54*  --  0.42*  LATICACIDVEN  --   --  0.66  --  0.98  --  0.52  --     Estimated Creatinine Clearance: 113 mL/min (A) (by C-G formula based on SCr of 0.42 mg/dL (L)).    Allergies  Allergen Reactions  . Bee Venom Anaphylaxis and Swelling    Swelling all over   Antimicrobials this admission:  12/5 Vancomycin  >> 12/5 Zosyn  >> 12/6 12/6 cefepime>> 12/6 flagyl>>  Dose adjustments this admission:  --  Microbiology results:  12/5 BCx x2:  12/5 HIV antb: NR  Thank you for allowing pharmacy to be a part of this patient's care.  Jason Buchanan, Jason Buchanan 11/08/2017 1:51 PM
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
## 2017-11-09 DIAGNOSIS — E876 Hypokalemia: Secondary | ICD-10-CM

## 2017-11-09 DIAGNOSIS — A419 Sepsis, unspecified organism: Principal | ICD-10-CM

## 2017-11-09 DIAGNOSIS — L03115 Cellulitis of right lower limb: Secondary | ICD-10-CM

## 2017-11-09 DIAGNOSIS — F1910 Other psychoactive substance abuse, uncomplicated: Secondary | ICD-10-CM

## 2017-11-09 DIAGNOSIS — F1410 Cocaine abuse, uncomplicated: Secondary | ICD-10-CM

## 2017-11-09 LAB — COMPREHENSIVE METABOLIC PANEL
ALT: 31 U/L (ref 17–63)
AST: 31 U/L (ref 15–41)
Albumin: 2.8 g/dL — ABNORMAL LOW (ref 3.5–5.0)
Alkaline Phosphatase: 45 U/L (ref 38–126)
Anion gap: 5 (ref 5–15)
BUN: 6 mg/dL (ref 6–20)
CHLORIDE: 111 mmol/L (ref 101–111)
CO2: 24 mmol/L (ref 22–32)
Calcium: 8.2 mg/dL — ABNORMAL LOW (ref 8.9–10.3)
Creatinine, Ser: 0.51 mg/dL — ABNORMAL LOW (ref 0.61–1.24)
GFR calc Af Amer: >60 mL/min (ref >60)
Glucose, Bld: 107 mg/dL — ABNORMAL HIGH (ref 65–99)
POTASSIUM: 3.9 mmol/L (ref 3.5–5.1)
Sodium: 140 mmol/L (ref 135–145)
Total Bilirubin: 0.2 mg/dL — ABNORMAL LOW (ref 0.3–1.2)
Total Protein: 5.7 g/dL — ABNORMAL LOW (ref 6.5–8.1)

## 2017-11-09 LAB — CBC
HCT: 32 % — ABNORMAL LOW (ref 39.0–52.0)
Hemoglobin: 10.6 g/dL — ABNORMAL LOW (ref 13.0–17.0)
MCH: 29 pg (ref 26.0–34.0)
MCHC: 33.1 g/dL (ref 30.0–36.0)
MCV: 87.7 fL (ref 78.0–100.0)
PLATELETS: 208 10*3/uL (ref 150–400)
RBC: 3.65 MIL/uL — ABNORMAL LOW (ref 4.22–5.81)
RDW: 15.6 % — AB (ref 11.5–15.5)
WBC: 9.6 10*3/uL (ref 4.0–10.5)

## 2017-11-09 LAB — MAGNESIUM: MAGNESIUM: 1.5 mg/dL — AB (ref 1.7–2.4)

## 2017-11-09 LAB — C-REACTIVE PROTEIN: CRP: 5.5 mg/dL — AB (ref <1.0)

## 2017-11-09 MED ORDER — DOXYCYCLINE MONOHYDRATE 100 MG PO TABS: 100.0000 mg | ORAL_TABLET | Freq: Two times a day (BID) | ORAL | 0 refills | Status: AC

## 2017-11-09 MED ORDER — MAGNESIUM SULFATE 2 GM/50ML IV SOLN
2.0000 g | Freq: Once | INTRAVENOUS | Status: AC
  Administered 2017-11-09: 2 g via INTRAVENOUS
  Filled 2017-11-09: qty 50

## 2017-11-09 NOTE — Progress Notes (Signed)
LCSW consulted for SA.  LCSW attempted to visit patient. Patient was sleeping and would not awake to voice of LCSW or knock.   LCSW will continue to follow.   Beulah GandyBernette Fuller, LSCW [facility] [phone]

## 2017-11-09 NOTE — Progress Notes (Signed)
Pt signed out of hospital via AMA.  Dr. Roda ShuttersXu aware and advised pt against medical advice and other potential risk to health.  Pt verbalized understanding.  Doxycycline prescription given and pt instructed to get filled ASAP and take all as prescribed. Pt verbalized understanding. Pt signed AMA form and left unit via elevator.
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
## 2017-11-12 LAB — CULTURE, BLOOD (ROUTINE X 2)
CULTURE: NO GROWTH
Culture: NO GROWTH
Special Requests: ADEQUATE
Special Requests: ADEQUATE

## 2018-03-21 ENCOUNTER — Other Ambulatory Visit: Payer: Self-pay | Admitting: Orthopedic Surgery

## 2018-03-21 DIAGNOSIS — M79672 Pain in left foot: Secondary | ICD-10-CM

## 2018-03-21 DIAGNOSIS — S92002A Unspecified fracture of left calcaneus, initial encounter for closed fracture: Secondary | ICD-10-CM

## 2018-03-26 DIAGNOSIS — M79672 Pain in left foot: Secondary | ICD-10-CM | POA: Diagnosis not present

## 2018-03-26 DIAGNOSIS — Z590 Homelessness: Secondary | ICD-10-CM | POA: Insufficient documentation

## 2018-03-26 DIAGNOSIS — Z9660 Presence of unspecified orthopedic joint implant: Secondary | ICD-10-CM | POA: Insufficient documentation

## 2018-03-26 DIAGNOSIS — S92002D Unspecified fracture of left calcaneus, subsequent encounter for fracture with routine healing: Secondary | ICD-10-CM | POA: Insufficient documentation

## 2018-03-26 DIAGNOSIS — F17210 Nicotine dependence, cigarettes, uncomplicated: Secondary | ICD-10-CM | POA: Diagnosis not present

## 2018-03-26 DIAGNOSIS — J45909 Unspecified asthma, uncomplicated: Secondary | ICD-10-CM | POA: Diagnosis not present

## 2018-03-27 ENCOUNTER — Encounter (HOSPITAL_COMMUNITY): Payer: Self-pay | Admitting: Emergency Medicine

## 2018-03-27 ENCOUNTER — Emergency Department (HOSPITAL_COMMUNITY)
Admission: EM | Admit: 2018-03-27 | Discharge: 2018-03-27 | Disposition: A | Discharge status: Home / Self Care | Payer: Medicaid Other | Attending: Emergency Medicine | Admitting: Emergency Medicine

## 2018-03-27 ENCOUNTER — Other Ambulatory Visit: Payer: Self-pay

## 2018-03-27 ENCOUNTER — Inpatient Hospital Stay
Admission: RE | Admit: 2018-03-27 | Discharge: 2018-03-27 | Disposition: A | Discharge status: Home / Self Care | Payer: Medicaid Other | Source: Ambulatory Visit | Attending: Orthopedic Surgery | Admitting: Orthopedic Surgery

## 2018-03-27 DIAGNOSIS — M79672 Pain in left foot: Secondary | ICD-10-CM

## 2018-03-27 NOTE — Discharge Instructions (Signed)
Follow-up with murphy wainer later today as scheduled. Return here for any new/acute changes.
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
## 2018-03-28 ENCOUNTER — Inpatient Hospital Stay
Admission: RE | Admit: 2018-03-28 | Discharge: 2018-03-28 | Disposition: A | Discharge status: Home / Self Care | Payer: Medicaid Other | Source: Ambulatory Visit | Attending: Orthopedic Surgery | Admitting: Orthopedic Surgery

## 2018-03-29 ENCOUNTER — Other Ambulatory Visit: Payer: Medicaid Other

## 2018-03-29 ENCOUNTER — Other Ambulatory Visit: Payer: Self-pay | Admitting: Orthopedic Surgery

## 2018-03-29 DIAGNOSIS — S92002A Unspecified fracture of left calcaneus, initial encounter for closed fracture: Secondary | ICD-10-CM

## 2018-03-29 DIAGNOSIS — M79672 Pain in left foot: Secondary | ICD-10-CM

## 2018-04-03 ENCOUNTER — Other Ambulatory Visit: Payer: Medicaid Other

## 2021-09-18 ENCOUNTER — Emergency Department (HOSPITAL_COMMUNITY): Payer: Medicaid Other

## 2021-09-18 ENCOUNTER — Encounter (HOSPITAL_COMMUNITY): Payer: Self-pay

## 2021-09-18 ENCOUNTER — Emergency Department (HOSPITAL_COMMUNITY)
Admission: EM | Admit: 2021-09-18 | Discharge: 2021-09-18 | Disposition: A | Discharge status: Home / Self Care | Payer: Medicaid Other | Attending: Emergency Medicine | Admitting: Emergency Medicine

## 2021-09-18 ENCOUNTER — Other Ambulatory Visit: Payer: Self-pay

## 2021-09-18 DIAGNOSIS — R000 Tachycardia, unspecified: Secondary | ICD-10-CM | POA: Insufficient documentation

## 2021-09-18 DIAGNOSIS — S62327A Displaced fracture of shaft of fifth metacarpal bone, left hand, initial encounter for closed fracture: Secondary | ICD-10-CM | POA: Insufficient documentation

## 2021-09-18 DIAGNOSIS — F17210 Nicotine dependence, cigarettes, uncomplicated: Secondary | ICD-10-CM | POA: Insufficient documentation

## 2021-09-18 DIAGNOSIS — J45909 Unspecified asthma, uncomplicated: Secondary | ICD-10-CM | POA: Insufficient documentation

## 2021-09-18 DIAGNOSIS — S2231XA Fracture of one rib, right side, initial encounter for closed fracture: Secondary | ICD-10-CM | POA: Insufficient documentation

## 2021-09-18 DIAGNOSIS — W208XXA Other cause of strike by thrown, projected or falling object, initial encounter: Secondary | ICD-10-CM | POA: Insufficient documentation

## 2021-09-18 MED ORDER — ACETAMINOPHEN 500 MG PO TABS
1000.0000 mg | ORAL_TABLET | Freq: Once | ORAL | Status: AC
  Administered 2021-09-18: 1000 mg via ORAL
  Filled 2021-09-18: qty 2
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
## 2021-09-18 NOTE — Progress Notes (Signed)
Orthopedic Tech Progress Note Patient Details:  LARSON LIMONES 05-31-1986 [id number]  Ortho Devices Type of Ortho Device: Ulna gutter splint Ortho Device/Splint Location: ULE Ortho Device/Splint Interventions: Ordered, Application, Adjustment   Post Interventions Patient Tolerated: Well Pt. Was very jittery, so he was difficult to splint.  Darleen Crocker 09/18/2021, 11:05 PM

## 2021-09-18 NOTE — ED Notes (Signed)
Ortho tech called to place a splint.
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
## 2021-09-18 NOTE — Discharge Instructions (Addendum)
You were found to have a right-sided rib fracture today.  Please use the tool that you have been discharged with.  You should take a deep inhalation with the tool to assure that your lungs stay open.  Also please follow-up with a pulmonologist about the nodule noted on the chest x-ray.  I am attaching an office for you to utilize at that time.    You also have a fracture in your left hand.  Please follow-up with the specialist attached your discharge papers within the next week. It was a pleasure to meet you and I hope that you feel better

## 2021-09-20 ENCOUNTER — Encounter (HOSPITAL_COMMUNITY): Payer: Self-pay

## 2021-09-20 ENCOUNTER — Emergency Department (HOSPITAL_COMMUNITY): Payer: Self-pay

## 2021-09-20 ENCOUNTER — Other Ambulatory Visit: Payer: Self-pay

## 2021-09-20 ENCOUNTER — Emergency Department (HOSPITAL_COMMUNITY)
Admission: EM | Admit: 2021-09-20 | Discharge: 2021-09-20 | Disposition: A | Discharge status: Home / Self Care | Payer: Self-pay | Attending: Emergency Medicine | Admitting: Emergency Medicine

## 2021-09-20 DIAGNOSIS — S8991XA Unspecified injury of right lower leg, initial encounter: Secondary | ICD-10-CM | POA: Insufficient documentation

## 2021-09-20 DIAGNOSIS — F17210 Nicotine dependence, cigarettes, uncomplicated: Secondary | ICD-10-CM | POA: Insufficient documentation

## 2021-09-20 DIAGNOSIS — Y92524 Gas station as the place of occurrence of the external cause: Secondary | ICD-10-CM | POA: Insufficient documentation

## 2021-09-20 DIAGNOSIS — Z9660 Presence of unspecified orthopedic joint implant: Secondary | ICD-10-CM | POA: Insufficient documentation

## 2021-09-20 DIAGNOSIS — S2241XA Multiple fractures of ribs, right side, initial encounter for closed fracture: Secondary | ICD-10-CM | POA: Insufficient documentation

## 2021-09-20 DIAGNOSIS — S62397D Other fracture of fifth metacarpal bone, left hand, subsequent encounter for fracture with routine healing: Secondary | ICD-10-CM | POA: Insufficient documentation

## 2021-09-20 DIAGNOSIS — J45909 Unspecified asthma, uncomplicated: Secondary | ICD-10-CM | POA: Insufficient documentation

## 2021-09-20 MED ORDER — NAPROXEN 500 MG PO TABS: 500.0000 mg | ORAL_TABLET | Freq: Two times a day (BID) | ORAL | 0 refills | Status: DC
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
## 2021-09-20 NOTE — Progress Notes (Signed)
Orthopedic Tech Progress Note Patient Details:  Jason Buchanan January 13, 1986 [id number]  Patient ID: Jason Buchanan, male   DOB: August 08, 1986, 35 y.o.   MRN: [id number]  Jason Buchanan 09/20/2021, 9:38 AM Left ulna gutter applied
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
## 2021-09-27 ENCOUNTER — Inpatient Hospital Stay (HOSPITAL_COMMUNITY): Payer: Self-pay | Admitting: Anesthesiology

## 2021-09-27 ENCOUNTER — Inpatient Hospital Stay (HOSPITAL_COMMUNITY)
Admission: EM | Admit: 2021-09-27 | Discharge: 2021-10-02 | DRG: 603 | Disposition: A | Discharge status: Home / Self Care | Payer: Self-pay | Attending: Family Medicine | Admitting: Family Medicine

## 2021-09-27 ENCOUNTER — Encounter (HOSPITAL_COMMUNITY): Payer: Self-pay | Admitting: Emergency Medicine

## 2021-09-27 ENCOUNTER — Emergency Department (HOSPITAL_COMMUNITY): Payer: Self-pay

## 2021-09-27 ENCOUNTER — Encounter (HOSPITAL_COMMUNITY)
Admission: EM | Disposition: A | Discharge status: Home / Self Care | Payer: Self-pay | Source: Home / Self Care | Attending: Family Medicine

## 2021-09-27 DIAGNOSIS — L03012 Cellulitis of left finger: Secondary | ICD-10-CM | POA: Diagnosis present

## 2021-09-27 DIAGNOSIS — Z5900 Homelessness unspecified: Secondary | ICD-10-CM

## 2021-09-27 DIAGNOSIS — L03113 Cellulitis of right upper limb: Secondary | ICD-10-CM | POA: Diagnosis present

## 2021-09-27 DIAGNOSIS — E876 Hypokalemia: Secondary | ICD-10-CM | POA: Diagnosis present

## 2021-09-27 DIAGNOSIS — Z20822 Contact with and (suspected) exposure to covid-19: Secondary | ICD-10-CM | POA: Diagnosis present

## 2021-09-27 DIAGNOSIS — L02512 Cutaneous abscess of left hand: Secondary | ICD-10-CM | POA: Diagnosis present

## 2021-09-27 DIAGNOSIS — L03119 Cellulitis of unspecified part of limb: Secondary | ICD-10-CM | POA: Diagnosis present

## 2021-09-27 DIAGNOSIS — J4520 Mild intermittent asthma, uncomplicated: Secondary | ICD-10-CM | POA: Diagnosis present

## 2021-09-27 DIAGNOSIS — L02511 Cutaneous abscess of right hand: Principal | ICD-10-CM | POA: Diagnosis present

## 2021-09-27 DIAGNOSIS — F17210 Nicotine dependence, cigarettes, uncomplicated: Secondary | ICD-10-CM | POA: Diagnosis present

## 2021-09-27 DIAGNOSIS — F1410 Cocaine abuse, uncomplicated: Secondary | ICD-10-CM | POA: Diagnosis present

## 2021-09-27 DIAGNOSIS — F1990 Other psychoactive substance use, unspecified, uncomplicated: Secondary | ICD-10-CM

## 2021-09-27 DIAGNOSIS — L02519 Cutaneous abscess of unspecified hand: Secondary | ICD-10-CM | POA: Diagnosis present

## 2021-09-27 DIAGNOSIS — R7302 Impaired glucose tolerance (oral): Secondary | ICD-10-CM | POA: Diagnosis present

## 2021-09-27 DIAGNOSIS — F1110 Opioid abuse, uncomplicated: Secondary | ICD-10-CM | POA: Diagnosis present

## 2021-09-27 HISTORY — PX: INCISION AND DRAINAGE OF WOUND: SHX1803

## 2021-09-27 HISTORY — PX: I & D EXTREMITY: SHX5045

## 2021-09-27 LAB — BASIC METABOLIC PANEL
Anion gap: 10 (ref 5–15)
BUN: 7 mg/dL (ref 6–20)
CO2: 26 mmol/L (ref 22–32)
Calcium: 8.3 mg/dL — ABNORMAL LOW (ref 8.9–10.3)
Chloride: 97 mmol/L — ABNORMAL LOW (ref 98–111)
Creatinine, Ser: 0.68 mg/dL (ref 0.61–1.24)
GFR, Estimated: >60 mL/min (ref >60)
Glucose, Bld: 127 mg/dL — ABNORMAL HIGH (ref 70–99)
Potassium: 3.1 mmol/L — ABNORMAL LOW (ref 3.5–5.1)
Sodium: 133 mmol/L — ABNORMAL LOW (ref 135–145)

## 2021-09-27 LAB — CBC WITH DIFFERENTIAL/PLATELET
Abs Immature Granulocytes: 0.09 10*3/uL — ABNORMAL HIGH (ref 0.00–0.07)
Basophils Absolute: 0.1 10*3/uL (ref 0.0–0.1)
Basophils Relative: 0 %
Eosinophils Absolute: 0.2 10*3/uL (ref 0.0–0.5)
Eosinophils Relative: 1 %
HCT: 37.3 % — ABNORMAL LOW (ref 39.0–52.0)
Hemoglobin: 12.4 g/dL — ABNORMAL LOW (ref 13.0–17.0)
Immature Granulocytes: 1 %
Lymphocytes Relative: 8 %
Lymphs Abs: 1.3 10*3/uL (ref 0.7–4.0)
MCH: 29.9 pg (ref 26.0–34.0)
MCHC: 33.2 g/dL (ref 30.0–36.0)
MCV: 89.9 fL (ref 80.0–100.0)
Monocytes Absolute: 1.2 10*3/uL — ABNORMAL HIGH (ref 0.1–1.0)
Monocytes Relative: 7 %
Neutro Abs: 13.1 10*3/uL — ABNORMAL HIGH (ref 1.7–7.7)
Neutrophils Relative %: 83 %
Platelets: 458 10*3/uL — ABNORMAL HIGH (ref 150–400)
RBC: 4.15 MIL/uL — ABNORMAL LOW (ref 4.22–5.81)
RDW: 12.1 % (ref 11.5–15.5)
WBC: 15.9 10*3/uL — ABNORMAL HIGH (ref 4.0–10.5)
nRBC: 0 % (ref 0.0–0.2)

## 2021-09-27 LAB — RESP PANEL BY RT-PCR (FLU A&B, COVID) ARPGX2
Influenza A by PCR: NEGATIVE
Influenza B by PCR: NEGATIVE
SARS Coronavirus 2 by RT PCR: NEGATIVE

## 2021-09-27 LAB — LACTIC ACID, PLASMA
Lactic Acid, Venous: 2.3 mmol/L (ref 0.5–1.9)
Lactic Acid, Venous: 1.5 mmol/L (ref 0.5–1.9)

## 2021-09-27 SURGERY — IRRIGATION AND DEBRIDEMENT EXTREMITY
Anesthesia: General | Site: Hand | Laterality: Right

## 2021-09-27 MED ORDER — ADULT MULTIVITAMIN W/MINERALS CH
1.0000 | ORAL_TABLET | Freq: Every day | ORAL | Status: DC
  Administered 2021-09-28 – 2021-10-02 (×5): 1 via ORAL
  Filled 2021-09-27 (×5): qty 1

## 2021-09-27 MED ORDER — POTASSIUM CHLORIDE CRYS ER 20 MEQ PO TBCR
40.0000 meq | EXTENDED_RELEASE_TABLET | Freq: Once | ORAL | Status: AC
  Administered 2021-09-27: 40 meq via ORAL
  Filled 2021-09-27: qty 2

## 2021-09-27 MED ORDER — BUPIVACAINE HCL (PF) 0.25 % IJ SOLN
INTRAMUSCULAR | Status: AC
  Filled 2021-09-27: qty 30

## 2021-09-27 MED ORDER — BACID PO TABS
2.0000 | ORAL_TABLET | Freq: Three times a day (TID) | ORAL | Status: DC
  Filled 2021-09-27 (×3): qty 2

## 2021-09-27 MED ORDER — SODIUM CHLORIDE 0.9 % IV SOLN: INTRAVENOUS | Status: AC

## 2021-09-27 MED ORDER — DEXMEDETOMIDINE (PRECEDEX) IN NS 20 MCG/5ML (4 MCG/ML) IV SYRINGE
PREFILLED_SYRINGE | INTRAVENOUS | Status: DC | PRN
  Administered 2021-09-27: 12 ug via INTRAVENOUS
  Administered 2021-09-27: 8 ug via INTRAVENOUS

## 2021-09-27 MED ORDER — HYDROXYZINE HCL 10 MG PO TABS: 10.0000 mg | ORAL_TABLET | Freq: Three times a day (TID) | ORAL | Status: DC | PRN

## 2021-09-27 MED ORDER — CLONIDINE HCL 0.1 MG PO TABS
0.1000 mg | ORAL_TABLET | Freq: Three times a day (TID) | ORAL | Status: DC | PRN
  Administered 2021-09-29 (×3): 0.1 mg via ORAL
  Filled 2021-09-27 (×3): qty 1

## 2021-09-27 MED ORDER — BUPIVACAINE HCL (PF) 0.25 % IJ SOLN
INTRAMUSCULAR | Status: DC | PRN
  Administered 2021-09-27: 30 mL

## 2021-09-27 MED ORDER — HYDROMORPHONE HCL 1 MG/ML IJ SOLN
INTRAMUSCULAR | Status: DC | PRN
  Administered 2021-09-27: .5 mg via INTRAVENOUS

## 2021-09-27 MED ORDER — SODIUM CHLORIDE 0.9 % IR SOLN
Status: DC | PRN
  Administered 2021-09-27 (×2): 1000 mL

## 2021-09-27 MED ORDER — LORAZEPAM 2 MG/ML IJ SOLN
1.0000 mg | INTRAMUSCULAR | Status: AC | PRN
  Administered 2021-09-29: 2 mg via INTRAVENOUS
  Filled 2021-09-27: qty 2

## 2021-09-27 MED ORDER — HYDROMORPHONE HCL 1 MG/ML IJ SOLN
INTRAMUSCULAR | Status: AC
  Filled 2021-09-27: qty 0.5

## 2021-09-27 MED ORDER — ORAL CARE MOUTH RINSE: 15.0000 mL | Freq: Once | OROMUCOSAL | Status: AC

## 2021-09-27 MED ORDER — CLINDAMYCIN PHOSPHATE 900 MG/50ML IV SOLN
900.0000 mg | Freq: Once | INTRAVENOUS | Status: AC
  Administered 2021-09-27: 900 mg via INTRAVENOUS
  Filled 2021-09-27: qty 50

## 2021-09-27 MED ORDER — OXYCODONE HCL 5 MG/5ML PO SOLN: 5.0000 mg | Freq: Once | ORAL | Status: DC | PRN

## 2021-09-27 MED ORDER — LORAZEPAM 1 MG PO TABS
1.0000 mg | ORAL_TABLET | ORAL | Status: AC | PRN
  Administered 2021-09-27: 1 mg via ORAL
  Filled 2021-09-27: qty 1

## 2021-09-27 MED ORDER — OXYCODONE HCL 5 MG PO TABS
5.0000 mg | ORAL_TABLET | Freq: Once | ORAL | Status: DC | PRN
Start: 2021-09-27 — End: 2021-09-27

## 2021-09-27 MED ORDER — FOLIC ACID 1 MG PO TABS
1.0000 mg | ORAL_TABLET | Freq: Every day | ORAL | Status: DC
  Administered 2021-09-28 – 2021-10-02 (×5): 1 mg via ORAL
  Filled 2021-09-27 (×5): qty 1

## 2021-09-27 MED ORDER — ONDANSETRON HCL 4 MG/2ML IJ SOLN
INTRAMUSCULAR | Status: DC | PRN
  Administered 2021-09-27: 4 mg via INTRAVENOUS

## 2021-09-27 MED ORDER — DICYCLOMINE HCL 10 MG PO CAPS
10.0000 mg | ORAL_CAPSULE | Freq: Three times a day (TID) | ORAL | Status: DC | PRN
  Administered 2021-09-27: 10 mg via ORAL
  Filled 2021-09-27 (×2): qty 1

## 2021-09-27 MED ORDER — KETOROLAC TROMETHAMINE 30 MG/ML IJ SOLN
30.0000 mg | Freq: Four times a day (QID) | INTRAMUSCULAR | Status: DC | PRN
  Administered 2021-09-27 – 2021-09-30 (×10): 30 mg via INTRAVENOUS
  Filled 2021-09-27 (×10): qty 1

## 2021-09-27 MED ORDER — MIDAZOLAM HCL 2 MG/2ML IJ SOLN
INTRAMUSCULAR | Status: AC
  Filled 2021-09-27: qty 2

## 2021-09-27 MED ORDER — MIDAZOLAM HCL 2 MG/2ML IJ SOLN
INTRAMUSCULAR | Status: DC | PRN
  Administered 2021-09-27: 2 mg via INTRAVENOUS

## 2021-09-27 MED ORDER — FENTANYL CITRATE (PF) 100 MCG/2ML IJ SOLN
INTRAMUSCULAR | Status: AC
  Filled 2021-09-27: qty 2

## 2021-09-27 MED ORDER — FENTANYL CITRATE (PF) 250 MCG/5ML IJ SOLN
INTRAMUSCULAR | Status: AC
  Filled 2021-09-27: qty 5

## 2021-09-27 MED ORDER — ACETAMINOPHEN 650 MG RE SUPP: 650.0000 mg | Freq: Four times a day (QID) | RECTAL | Status: DC | PRN

## 2021-09-27 MED ORDER — POTASSIUM CHLORIDE 10 MEQ/100ML IV SOLN
10.0000 meq | INTRAVENOUS | Status: AC
  Administered 2021-09-27: 10 meq via INTRAVENOUS
  Filled 2021-09-27: qty 100

## 2021-09-27 MED ORDER — PROPOFOL 10 MG/ML IV BOLUS
INTRAVENOUS | Status: DC | PRN
  Administered 2021-09-27: 200 mg via INTRAVENOUS

## 2021-09-27 MED ORDER — IRRISEPT - 450ML BOTTLE WITH 0.05% CHG IN STERILE WATER, USP 99.95% OPTIME
TOPICAL | Status: DC | PRN
  Administered 2021-09-27: 450 mL

## 2021-09-27 MED ORDER — ACETAMINOPHEN 500 MG PO TABS
1000.0000 mg | ORAL_TABLET | Freq: Once | ORAL | Status: AC
  Administered 2021-09-27: 1000 mg via ORAL
  Filled 2021-09-27: qty 2

## 2021-09-27 MED ORDER — STERILE WATER FOR IRRIGATION IR SOLN
Status: DC | PRN
  Administered 2021-09-27: 600 mL

## 2021-09-27 MED ORDER — ONDANSETRON HCL 4 MG PO TABS: 4.0000 mg | ORAL_TABLET | Freq: Four times a day (QID) | ORAL | Status: DC | PRN

## 2021-09-27 MED ORDER — LIDOCAINE 2% (20 MG/ML) 5 ML SYRINGE
INTRAMUSCULAR | Status: DC | PRN
  Administered 2021-09-27: 60 mg via INTRAVENOUS

## 2021-09-27 MED ORDER — SODIUM CHLORIDE 0.9 % IV BOLUS
1000.0000 mL | Freq: Once | INTRAVENOUS | Status: AC
  Administered 2021-09-27: 1000 mL via INTRAVENOUS

## 2021-09-27 MED ORDER — ACETAMINOPHEN 325 MG PO TABS
650.0000 mg | ORAL_TABLET | Freq: Four times a day (QID) | ORAL | Status: DC | PRN
  Administered 2021-09-28 – 2021-09-30 (×2): 650 mg via ORAL
  Filled 2021-09-27 (×2): qty 2

## 2021-09-27 MED ORDER — LACTATED RINGERS IV SOLN: INTRAVENOUS | Status: DC

## 2021-09-27 MED ORDER — KETOROLAC TROMETHAMINE 30 MG/ML IJ SOLN: 30.0000 mg | Freq: Once | INTRAMUSCULAR | Status: DC | PRN

## 2021-09-27 MED ORDER — THIAMINE HCL 100 MG PO TABS
100.0000 mg | ORAL_TABLET | Freq: Every day | ORAL | Status: DC
  Administered 2021-09-28 – 2021-10-02 (×5): 100 mg via ORAL
  Filled 2021-09-27 (×5): qty 1

## 2021-09-27 MED ORDER — CHLORHEXIDINE GLUCONATE 0.12 % MT SOLN
OROMUCOSAL | Status: AC
  Administered 2021-09-27: 15 mL via OROMUCOSAL
  Filled 2021-09-27: qty 15

## 2021-09-27 MED ORDER — ONDANSETRON HCL 4 MG/2ML IJ SOLN: 4.0000 mg | Freq: Four times a day (QID) | INTRAMUSCULAR | Status: DC | PRN

## 2021-09-27 MED ORDER — HYDROMORPHONE HCL 1 MG/ML IJ SOLN: 0.2500 mg | INTRAMUSCULAR | Status: DC | PRN

## 2021-09-27 MED ORDER — FENTANYL CITRATE (PF) 250 MCG/5ML IJ SOLN
INTRAMUSCULAR | Status: DC | PRN
  Administered 2021-09-27 (×4): 50 ug via INTRAVENOUS

## 2021-09-27 MED ORDER — PROMETHAZINE HCL 25 MG/ML IJ SOLN: 6.2500 mg | INTRAMUSCULAR | Status: DC | PRN

## 2021-09-27 MED ORDER — CHLORHEXIDINE GLUCONATE 0.12 % MT SOLN: 15.0000 mL | Freq: Once | OROMUCOSAL | Status: AC

## 2021-09-27 MED ORDER — THIAMINE HCL 100 MG/ML IJ SOLN
100.0000 mg | Freq: Every day | INTRAMUSCULAR | Status: DC
  Administered 2021-09-27: 100 mg via INTRAVENOUS
  Filled 2021-09-27: qty 2

## 2021-09-27 SURGICAL SUPPLY — 54 items
BAG COUNTER SPONGE SURGICOUNT (BAG) ×2 IMPLANT
BAG DECANTER FOR FLEXI CONT (MISCELLANEOUS) ×4 IMPLANT
BAG SURGICOUNT SPONGE COUNTING (BAG)
BLADE SURG 15 STRL LF DISP TIS (BLADE) IMPLANT
BLADE SURG 15 STRL SS (BLADE) ×4
BNDG COHESIVE 1X5 TAN STRL LF (GAUZE/BANDAGES/DRESSINGS) ×2 IMPLANT
BNDG CONFORM 2 STRL LF (GAUZE/BANDAGES/DRESSINGS) ×2 IMPLANT
BNDG ELASTIC 3X5.8 VLCR STR LF (GAUZE/BANDAGES/DRESSINGS) IMPLANT
BNDG ELASTIC 4X5.8 VLCR STR LF (GAUZE/BANDAGES/DRESSINGS) ×2 IMPLANT
BNDG GAUZE ELAST 4 BULKY (GAUZE/BANDAGES/DRESSINGS) ×4 IMPLANT
CORD BIPOLAR FORCEPS 12FT (ELECTRODE) ×2 IMPLANT
CUFF TOURN SGL QUICK 18X4 (TOURNIQUET CUFF) IMPLANT
DRAPE EXTREMITY T 121X128X90 (DISPOSABLE) ×2 IMPLANT
DRAPE IMP U-DRAPE 54X76 (DRAPES) ×2 IMPLANT
DRAPE SURG 17X23 STRL (DRAPES) ×2 IMPLANT
DRSG XEROFORM 1X8 (GAUZE/BANDAGES/DRESSINGS) ×4 IMPLANT
ELECT REM PT RETURN 9FT ADLT (ELECTROSURGICAL)
ELECTRODE REM PT RTRN 9FT ADLT (ELECTROSURGICAL) IMPLANT
GAUZE PACKING IODOFORM 1/4X15 (PACKING) ×2 IMPLANT
GAUZE SPONGE 4X4 12PLY STRL (GAUZE/BANDAGES/DRESSINGS) ×2 IMPLANT
GAUZE SPONGE 4X4 12PLY STRL LF (GAUZE/BANDAGES/DRESSINGS) ×6 IMPLANT
GAUZE XEROFORM 1X8 LF (GAUZE/BANDAGES/DRESSINGS) ×2 IMPLANT
GLOVE SRG 8 PF TXTR STRL LF DI (GLOVE) IMPLANT
GLOVE SURG ENC TEXT LTX SZ8 (GLOVE) ×4 IMPLANT
GLOVE SURG UNDER POLY LF SZ6.5 (GLOVE) ×4 IMPLANT
GLOVE SURG UNDER POLY LF SZ8 (GLOVE) ×4
GOWN STRL REIN XL XLG (GOWN DISPOSABLE) ×2 IMPLANT
GOWN STRL REUS W/ TWL LRG LVL3 (GOWN DISPOSABLE) ×4 IMPLANT
GOWN STRL REUS W/TWL LRG LVL3 (GOWN DISPOSABLE) ×4
HANDPIECE INTERPULSE COAX TIP (DISPOSABLE)
IV NS IRRIG 3000ML ARTHROMATIC (IV SOLUTION) IMPLANT
KIT BASIN OR (CUSTOM PROCEDURE TRAY) ×4 IMPLANT
KIT TURNOVER KIT B (KITS) ×4 IMPLANT
MANIFOLD NEPTUNE II (INSTRUMENTS) ×2 IMPLANT
NDL HYPO 25GX1X1/2 BEV (NEEDLE) IMPLANT
NEEDLE HYPO 25GX1X1/2 BEV (NEEDLE) ×8 IMPLANT
NS IRRIG 1000ML POUR BTL (IV SOLUTION) ×6 IMPLANT
PACK ORTHO EXTREMITY (CUSTOM PROCEDURE TRAY) ×4 IMPLANT
PAD ARMBOARD 7.5X6 YLW CONV (MISCELLANEOUS) ×6 IMPLANT
PAD CAST 4YDX4 CTTN HI CHSV (CAST SUPPLIES) IMPLANT
PADDING CAST COTTON 4X4 STRL (CAST SUPPLIES)
SET CYSTO W/LG BORE CLAMP LF (SET/KITS/TRAYS/PACK) IMPLANT
SET HNDPC FAN SPRY TIP SCT (DISPOSABLE) IMPLANT
SOAP 2 % CHG 4 OZ (WOUND CARE) ×6 IMPLANT
SPONGE T-LAP 18X18 ~~LOC~~+RFID (SPONGE) ×2 IMPLANT
SPONGE T-LAP 4X18 ~~LOC~~+RFID (SPONGE) ×4 IMPLANT
SWAB CULTURE ESWAB REG 1ML (MISCELLANEOUS) IMPLANT
SYR CONTROL 10ML LL (SYRINGE) ×2 IMPLANT
TOWEL GREEN STERILE (TOWEL DISPOSABLE) ×4 IMPLANT
TOWEL GREEN STERILE FF (TOWEL DISPOSABLE) ×4 IMPLANT
TUBE CONNECTING 12'X1/4 (SUCTIONS) ×1
TUBE CONNECTING 12X1/4 (SUCTIONS) ×3 IMPLANT
WATER STERILE IRR 1000ML POUR (IV SOLUTION) ×4 IMPLANT
YANKAUER SUCT BULB TIP NO VENT (SUCTIONS) ×2 IMPLANT

## 2021-09-27 NOTE — Anesthesia Preprocedure Evaluation (Addendum)
Anesthesia Evaluation  Patient identified by MRN, date of birth, ID band Patient awake    Reviewed: Allergy & Precautions, NPO status , Patient's Chart, lab work & pertinent test results  History of Anesthesia Complications (+) AWARENESS UNDER ANESTHESIA and history of anesthetic complications ([age] w/ leg surgery)  Airway Mallampati: IV  TM Distance: >3 FB Neck ROM: Full  Mouth opening: Limited Mouth Opening Comment: possible limited effort on exam Dental  (+) Dental Advisory Given, Poor Dentition, Chipped,    Pulmonary asthma , Current Smoker,    Pulmonary exam normal breath sounds clear to auscultation       Cardiovascular negative cardio ROS Normal cardiovascular exam Rhythm:Regular Rate:Normal     Neuro/Psych negative neurological ROS  negative psych ROS   GI/Hepatic negative GI ROS, (+)     substance abuse (polysubstance abuse)  cocaine use and IV drug use,   Endo/Other  negative endocrine ROS  Renal/GU negative Renal ROS  negative genitourinary   Musculoskeletal negative musculoskeletal ROS (+)   Abdominal   Peds  Hematology negative hematology ROS (+) hct 37.3   Anesthesia Other Findings developed severe right hand swelling and pain 2 days ago after injection of heroin around the dorsal side of the 3rd MCP area.  Reproductive/Obstetrics negative OB ROS                            Anesthesia Physical Anesthesia Plan  ASA: 3  Anesthesia Plan: General   Post-op Pain Management:    Induction: Intravenous  PONV Risk Score and Plan: 2 and Ondansetron, Dexamethasone, Midazolam and Treatment may vary due to age or medical condition  Airway Management Planned: LMA  Additional Equipment: None  Intra-op Plan:   Post-operative Plan: Extubation in OR  Informed Consent: I have reviewed the patients History and Physical, chart, labs and discussed the procedure including the risks,  benefits and alternatives for the proposed anesthesia with the patient or authorized representative who has indicated his/her understanding and acceptance.     Dental advisory given  Plan Discussed with: CRNA  Anesthesia Plan Comments: (Upon examination, Dr. Lenon Curt will do I+D for both hands Pt has history of awareness under anesthesia in past, prefers GA vs MAC/local)       Anesthesia Quick Evaluation

## 2021-09-27 NOTE — Anesthesia Postprocedure Evaluation (Signed)
Anesthesia Post Note  Patient: Jason Buchanan  Procedure(s) Performed: IRRIGATION AND DEBRIDEMENT RIGHT HAND (Right: Hand) IRRIGATION AND DEBRIDEMENT LEFT INDEX FINGER (Left: Finger)     Patient location during evaluation: PACU Anesthesia Type: General Level of consciousness: awake and alert Pain management: pain level controlled Vital Signs Assessment: post-procedure vital signs reviewed and stable Respiratory status: spontaneous breathing, nonlabored ventilation, respiratory function stable and patient connected to nasal cannula oxygen Cardiovascular status: blood pressure returned to baseline and stable Postop Assessment: no apparent nausea or vomiting Anesthetic complications: no   No notable events documented.  Last Vitals:  Vitals:   09/27/21 2052 09/27/21 2134  BP: 135/64 134/62  Pulse: (!) 108 (!) 101  Resp: 20 18  Temp: 36.4 C 36.7 C  SpO2: 100% 99%    Last Pain:  Vitals:   09/27/21 2134  TempSrc: Oral  PainSc:                  [name]
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
## 2021-09-27 NOTE — ED Triage Notes (Signed)
Pt has wound to right hand that is swollen and red. States it started about 2-3 days ago. Denies iv drug use.

## 2021-09-27 NOTE — Anesthesia Procedure Notes (Signed)
Procedure Name: LMA Insertion Date/Time: 09/27/2021 7:43 PM Performed by: Carlos American, CRNA Pre-anesthesia Checklist: Patient identified, Emergency Drugs available, Suction available and Patient being monitored Patient Re-evaluated:Patient Re-evaluated prior to induction Oxygen Delivery Method: Circle System Utilized Preoxygenation: Pre-oxygenation with 100% oxygen Induction Type: IV induction Ventilation: Mask ventilation without difficulty LMA: LMA inserted LMA Size: 4.0 Number of attempts: 1 Placement Confirmation: positive ETCO2 Tube secured with: Tape Dental Injury: Teeth and Oropharynx as per pre-operative assessment

## 2021-09-27 NOTE — Transfer of Care (Signed)
Immediate Anesthesia Transfer of Care Note  Patient: Jason Buchanan  Procedure(s) Performed: IRRIGATION AND DEBRIDEMENT RIGHT HAND (Right)  Patient Location: PACU  Anesthesia Type:General  Level of Consciousness: awake, alert  and patient cooperative  Airway & Oxygen Therapy: Patient Spontanous Breathing  Post-op Assessment: Report given to RN and Post -op Vital signs reviewed and stable  Post vital signs: Reviewed and stable  Last Vitals:  Vitals Value Taken Time  BP 129/63   Temp    Pulse 101 09/27/21 2025  Resp 13 09/27/21 2025  SpO2 99 % 09/27/21 2025  Vitals shown include unvalidated device data.  Last Pain:  Vitals:   09/27/21 1910  TempSrc: Oral  PainSc: 9          Complications: No notable events documented.
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
## 2021-09-27 NOTE — ED Provider Notes (Signed)
Emergency Medicine Provider Triage Evaluation Note  Jason Buchanan , a 35 y.o. male  was evaluated in triage.  Pt with history of IV heroin usage who presents today with concern for infection in his right hand.  Was seen at [facility] orthopedics today for fracture in the left wrist and directed immediately to the ED for concern for infection of the right hand.  Right hand is large swollen, warm to the touch, and exquisitely tender to palpation x3 days.  Area of redness over the left hand started 2 days ago.  Patient endorses subjective fevers and shortness of breath  Review of Systems  Positive: Shortness of breath, swelling, pain, redness in both hands worse in the right than the left, subjective fevers and chills Negative: Chest pain  Physical Exam  BP (!) 132/96   Pulse (!) 102   Temp 98.6 F (37 C) (Oral)   Resp 18   Ht 5\' 7"  (1.702 m)   Wt 60 kg   SpO2 100%   BMI 20.72 kg/m  Gen:   Awake, no distress   Resp:  Normal effort  MSK:   Moves extremities without difficulty  Other:  Exquisitely tender, edematous, and erythematous right hand concerning for severe cellulitis of the hand.  Area of erythema and induration over the dorsal aspect of the first and second metacarpals as well on the left hand.  2+ radial pulses bilaterally.  Left hand with splint in place from [facility] orthopedics this morning.  No murmur on cardiac exam.  Regular rate and rhythm.  Medical Decision Making  Medically screening exam initiated at 1:22 PM.  Appropriate orders placed.  [facility] was informed that the remainder of the evaluation will be completed by another provider, this initial triage assessment does not replace that evaluation, and the importance of remaining in the ED until their evaluation is complete.  Exam extremely concerning for severe cellulitis of the right hand and possibly early cellulitis of the left hand.  Labs ordered we will hold off on hand consultation and patient is in a room  where he can be evaluated bedside.  This chart was dictated using voice recognition software, Dragon. Despite the best efforts of this provider to proofread and correct errors, errors may still occur which can change documentation meaning.   [name], PA-C 09/27/21 1348    09/29/21, DO 09/27/21 1430

## 2021-09-28 ENCOUNTER — Encounter (HOSPITAL_COMMUNITY): Payer: Self-pay | Admitting: General Surgery

## 2021-09-28 ENCOUNTER — Other Ambulatory Visit: Payer: Self-pay

## 2021-09-28 ENCOUNTER — Inpatient Hospital Stay (HOSPITAL_COMMUNITY): Payer: Self-pay

## 2021-09-28 DIAGNOSIS — I39 Endocarditis and heart valve disorders in diseases classified elsewhere: Secondary | ICD-10-CM

## 2021-09-28 LAB — CBC WITH DIFFERENTIAL/PLATELET
Abs Immature Granulocytes: 0.05 10*3/uL (ref 0.00–0.07)
Basophils Absolute: 0.1 10*3/uL (ref 0.0–0.1)
Basophils Relative: 1 %
Eosinophils Absolute: 0.5 10*3/uL (ref 0.0–0.5)
Eosinophils Relative: 4 %
HCT: 36.2 % — ABNORMAL LOW (ref 39.0–52.0)
Hemoglobin: 11.9 g/dL — ABNORMAL LOW (ref 13.0–17.0)
Immature Granulocytes: 0 %
Lymphocytes Relative: 20 %
Lymphs Abs: 2.5 10*3/uL (ref 0.7–4.0)
MCH: 29.6 pg (ref 26.0–34.0)
MCHC: 32.9 g/dL (ref 30.0–36.0)
MCV: 90 fL (ref 80.0–100.0)
Monocytes Absolute: 1.1 10*3/uL — ABNORMAL HIGH (ref 0.1–1.0)
Monocytes Relative: 9 %
Neutro Abs: 8.6 10*3/uL — ABNORMAL HIGH (ref 1.7–7.7)
Neutrophils Relative %: 66 %
Platelets: 429 10*3/uL — ABNORMAL HIGH (ref 150–400)
RBC: 4.02 MIL/uL — ABNORMAL LOW (ref 4.22–5.81)
RDW: 12.2 % (ref 11.5–15.5)
WBC: 12.8 10*3/uL — ABNORMAL HIGH (ref 4.0–10.5)
nRBC: 0 % (ref 0.0–0.2)

## 2021-09-28 LAB — BASIC METABOLIC PANEL
Anion gap: 8 (ref 5–15)
BUN: 5 mg/dL — ABNORMAL LOW (ref 6–20)
CO2: 25 mmol/L (ref 22–32)
Calcium: 8.1 mg/dL — ABNORMAL LOW (ref 8.9–10.3)
Chloride: 103 mmol/L (ref 98–111)
Creatinine, Ser: 0.67 mg/dL (ref 0.61–1.24)
GFR, Estimated: >60 mL/min (ref >60)
Glucose, Bld: 146 mg/dL — ABNORMAL HIGH (ref 70–99)
Potassium: 3.3 mmol/L — ABNORMAL LOW (ref 3.5–5.1)
Sodium: 136 mmol/L (ref 135–145)

## 2021-09-28 LAB — ECHOCARDIOGRAM COMPLETE
Area-P 1/2: 4.06 cm2
Calc EF: 54.3 %
Height: 67 in
S' Lateral: 2.6 cm
Single Plane A2C EF: 56.8 %
Single Plane A4C EF: 55.1 %
Weight: 2116.42 oz

## 2021-09-28 LAB — HIV ANTIBODY (ROUTINE TESTING W REFLEX): HIV Screen 4th Generation wRfx: NONREACTIVE

## 2021-09-28 MED ORDER — RISAQUAD PO CAPS
2.0000 | ORAL_CAPSULE | Freq: Three times a day (TID) | ORAL | Status: DC
  Administered 2021-09-28 – 2021-10-02 (×13): 2 via ORAL
  Filled 2021-09-28 (×15): qty 2

## 2021-09-28 MED ORDER — POTASSIUM CHLORIDE CRYS ER 20 MEQ PO TBCR
40.0000 meq | EXTENDED_RELEASE_TABLET | Freq: Two times a day (BID) | ORAL | Status: DC
  Administered 2021-09-28 (×2): 40 meq via ORAL
  Filled 2021-09-28 (×2): qty 2

## 2021-09-28 MED ORDER — SODIUM CHLORIDE 0.9 % IV SOLN: INTRAVENOUS | Status: DC
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
## 2021-09-28 NOTE — Plan of Care (Signed)

## 2021-09-28 NOTE — Progress Notes (Signed)
Pt's 1600 dressing change was not completed at scheduled time. When pt was approached about completing this task, pt stated that he would need his pain medication before doing so. Pt does not have any pain medications that are due prior to 1848 and stated that he would wait until then before proceeding with wound care. Pt states that when meds is given, he will "hop into the shower and clean the wounds that way before I have to redress them".

## 2021-09-28 NOTE — Op Note (Signed)
NAME: Jason Buchanan, SENNER MEDICAL RECORD NO: [id number] ACCOUNT NO: [id number] DATE OF BIRTH: October 31, 1986 FACILITY: [facility] LOCATION: [facility] PHYSICIAN: Sacoya Mcgourty C. Izora Ribas, MD  Operative Report   DATE OF PROCEDURE: 09/27/2021  PREOPERATIVE DIAGNOSES:  Abscess to the right hand and cellulitis and abscess to the left index finger.  POSTOPERATIVE DIAGNOSES:  Abscess to the right hand and cellulitis and abscess to the left index finger.  PROCEDURE:  I and D of abscess, right hand and left index finger.  ANESTHESIA:  General.  No acute complications.  No cultures.  INDICATIONS:  The patient is a 35 year old male who presented to the emergency room with abscess from IV drug abuse to the right dorsal hand and early abscess on the left index finger.  Risks, benefits and alternatives of I and D were discussed with him.   He agreed with this procedure.  Consent was obtained.  DESCRIPTION OF PROCEDURE:  The patient was taken to the operating room and placed supine on the operating room table.  Timeout was performed.  Anesthesia was administered.  The right upper extremity was addressed first.  This was prepped and draped in  normal sterile fashion.  There was obvious abscess on the dorsal right hand.  An incision was made over this abscess.  There was quite a bit of pus that was evacuated from the subcutaneous abscess.  There was some evidence that it extended into the  extensor tendon sheath.  This was opened. Thorough irrigation with 1.5 liters of saline solution and IrriSept solution was performed with probing of the wound to un-loculate any pockets.  Once this was performed, the wound was packed with iodoform gauze.   There was one scab on the volar wrist that was removed.  This wound was gently probed.  There did not appear to be any deep infection; however, the wound edges were cleaned.  This was packed with a very small piece of Xeroform gauze.  Next, a sterile  dressing was placed over the entire hand.  Following, the left index finger was addressed.  A small incision was made over the area of most fluctuance over the index finger metacarpophalangeal joint.  There was a little bit of purulence here.  This was  again thoroughly irrigated with irrigation solution.  The wound did not appear to go into the joint, but down to the tendon sheath.  This wound was packed again with iodoform gauze, and a sterile dressing was applied.  Marcaine was infiltrated around  these wounds for postoperative pain control.  ESTIMATED BLOOD LOSS:  Minimal.  No acute complications.  The patient tolerated the procedure well and was taken to recovery room, stable.   SHW D: 09/27/2021 8:15:36 pm T: 09/28/2021 12:58:00 am  JOB: [id number]

## 2021-09-28 NOTE — Progress Notes (Signed)
Mobility Specialist Progress Note    09/28/21 1308  Mobility  Activity Ambulated in hall  Level of Assistance Independent  Assistive Device None  Distance Ambulated (ft) 330 ft  Mobility Ambulated independently in hallway  Mobility Response Tolerated well  Mobility performed by Mobility specialist  $Mobility charge 1 Mobility   Pt received in bed and agreeable. C/o pain in hands and R leg. Walked with feet pointed out and a limp. Returned to bed with call bell in reach.   [place] Mobility Specialist  Mobility Specialist Phone: [phone]

## 2021-09-28 NOTE — Progress Notes (Signed)
S: pt states hand feels better  O:Blood pressure (!) 151/75, pulse 85, temperature 97.6 F (36.4 C), temperature source Oral, resp. rate 15, height 5\' 7"  (1.702 m), weight 60 kg, SpO2 98 %.  Dressings/packing removed, still some purulent drainage R, L wound clean, R repacked  A:s/p I &D R hand, LIF   P: cont abx, start tid wound washouts, repack

## 2021-09-28 NOTE — Progress Notes (Signed)
  Echocardiogram 2D Echocardiogram has been performed.  Augustine Radar 09/28/2021, 1:51 PM

## 2021-09-29 LAB — CBC WITH DIFFERENTIAL/PLATELET
Abs Immature Granulocytes: 0.08 10*3/uL — ABNORMAL HIGH (ref 0.00–0.07)
Basophils Absolute: 0.1 10*3/uL (ref 0.0–0.1)
Basophils Relative: 1 %
Eosinophils Absolute: 0.6 10*3/uL — ABNORMAL HIGH (ref 0.0–0.5)
Eosinophils Relative: 5 %
HCT: 39.1 % (ref 39.0–52.0)
Hemoglobin: 13 g/dL (ref 13.0–17.0)
Immature Granulocytes: 1 %
Lymphocytes Relative: 23 %
Lymphs Abs: 2.4 10*3/uL (ref 0.7–4.0)
MCH: 29.7 pg (ref 26.0–34.0)
MCHC: 33.2 g/dL (ref 30.0–36.0)
MCV: 89.5 fL (ref 80.0–100.0)
Monocytes Absolute: 0.8 10*3/uL (ref 0.1–1.0)
Monocytes Relative: 8 %
Neutro Abs: 6.7 10*3/uL (ref 1.7–7.7)
Neutrophils Relative %: 62 %
Platelets: 476 10*3/uL — ABNORMAL HIGH (ref 150–400)
RBC: 4.37 MIL/uL (ref 4.22–5.81)
RDW: 12.1 % (ref 11.5–15.5)
WBC: 10.6 10*3/uL — ABNORMAL HIGH (ref 4.0–10.5)
nRBC: 0 % (ref 0.0–0.2)

## 2021-09-29 LAB — COMPREHENSIVE METABOLIC PANEL
ALT: 28 U/L (ref 0–44)
AST: 30 U/L (ref 15–41)
Albumin: 2.5 g/dL — ABNORMAL LOW (ref 3.5–5.0)
Alkaline Phosphatase: 95 U/L (ref 38–126)
Anion gap: 7 (ref 5–15)
BUN: 7 mg/dL (ref 6–20)
CO2: 21 mmol/L — ABNORMAL LOW (ref 22–32)
Calcium: 8.5 mg/dL — ABNORMAL LOW (ref 8.9–10.3)
Chloride: 110 mmol/L (ref 98–111)
Creatinine, Ser: 0.57 mg/dL — ABNORMAL LOW (ref 0.61–1.24)
GFR, Estimated: >60 mL/min (ref >60)
Glucose, Bld: 108 mg/dL — ABNORMAL HIGH (ref 70–99)
Potassium: 4.4 mmol/L (ref 3.5–5.1)
Sodium: 138 mmol/L (ref 135–145)
Total Bilirubin: 0.4 mg/dL (ref 0.3–1.2)
Total Protein: 6.2 g/dL — ABNORMAL LOW (ref 6.5–8.1)

## 2021-09-29 MED ORDER — BUPRENORPHINE HCL-NALOXONE HCL 8-2 MG SL SUBL
1.0000 | SUBLINGUAL_TABLET | Freq: Every day | SUBLINGUAL | Status: DC
Start: 2021-09-29 — End: 2021-09-30
  Administered 2021-09-29 – 2021-09-30 (×2): 1 via SUBLINGUAL
  Filled 2021-09-29 (×2): qty 1

## 2021-09-29 MED ORDER — CLINDAMYCIN PHOSPHATE 300 MG/50ML IV SOLN
300.0000 mg | Freq: Three times a day (TID) | INTRAVENOUS | Status: DC
  Administered 2021-09-29: 300 mg via INTRAVENOUS
  Filled 2021-09-29 (×3): qty 50

## 2021-09-29 MED ORDER — CLINDAMYCIN HCL 300 MG PO CAPS
300.0000 mg | ORAL_CAPSULE | Freq: Three times a day (TID) | ORAL | Status: DC
  Administered 2021-09-29 – 2021-10-02 (×10): 300 mg via ORAL
  Filled 2021-09-29 (×13): qty 1

## 2021-09-29 MED ORDER — NICOTINE 14 MG/24HR TD PT24
14.0000 mg | MEDICATED_PATCH | Freq: Every day | TRANSDERMAL | Status: DC
  Administered 2021-09-29 – 2021-10-02 (×4): 14 mg via TRANSDERMAL
  Filled 2021-09-29 (×4): qty 1

## 2021-09-29 NOTE — Progress Notes (Signed)
Mobility Specialist Progress Note    09/29/21 1621  Mobility  Activity Ambulated in hall  Level of Assistance Independent  Assistive Device None  Distance Ambulated (ft) 570 ft  Mobility Ambulated independently in hallway  Mobility Response Tolerated well  Mobility performed by Mobility specialist  Bed Position Chair  $Mobility charge 1 Mobility   Pt received in bed with RN present and agreeable. C/o R knee pain and was using brace from home. Returned to chair with call bell in reach.   [place] Mobility Specialist  Mobility Specialist Phone: [phone]

## 2021-09-29 NOTE — Progress Notes (Signed)
Dressing changed per orders, with minimal assistance from pt.  Pt initially declined due to elevated pain but was accepting of education. Pt tolerated well. Will continue to encourage pt to comply with future dressing changes per MD recommendations for proper wound healing.
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
## 2021-09-29 NOTE — Progress Notes (Signed)
S: pt up out of bed, states he is participating in dressing changes, squeezing hand, packing  O:Blood pressure (!) 139/58, pulse 77, temperature 97.7 F (36.5 C), temperature source Oral, resp. rate 17, height 5\' 7"  (1.702 m), weight 60 kg, SpO2 100 %.  R hand much less swelling, no expressable drainage, still with erythema; LIF - wound bed clean, erythema present but  no purulent drainage  A:s/p I&D R hand , LIF   P: cont wound care, is clindamycin best abx of choice for 2-3 wks?  Ok to d/c when pt has ability / supplies to keep wounds clean.

## 2021-09-30 ENCOUNTER — Inpatient Hospital Stay (HOSPITAL_COMMUNITY): Payer: Self-pay

## 2021-09-30 ENCOUNTER — Encounter (HOSPITAL_COMMUNITY)
Admission: EM | Disposition: A | Discharge status: Home / Self Care | Payer: Self-pay | Source: Home / Self Care | Attending: Family Medicine

## 2021-09-30 ENCOUNTER — Encounter (HOSPITAL_COMMUNITY): Payer: Self-pay | Admitting: Internal Medicine

## 2021-09-30 ENCOUNTER — Inpatient Hospital Stay (HOSPITAL_COMMUNITY): Payer: Self-pay | Admitting: Anesthesiology

## 2021-09-30 ENCOUNTER — Other Ambulatory Visit (HOSPITAL_COMMUNITY): Payer: Self-pay

## 2021-09-30 HISTORY — PX: TEE WITHOUT CARDIOVERSION: SHX5443

## 2021-09-30 HISTORY — PX: BUBBLE STUDY: SHX6837

## 2021-09-30 SURGERY — ECHOCARDIOGRAM, TRANSESOPHAGEAL
Anesthesia: Monitor Anesthesia Care

## 2021-09-30 MED ORDER — BUPRENORPHINE HCL-NALOXONE HCL 8-2 MG SL SUBL
2.0000 | SUBLINGUAL_TABLET | Freq: Every day | SUBLINGUAL | Status: DC
Start: 2021-10-01 — End: 2021-10-02
  Administered 2021-10-01 – 2021-10-02 (×2): 2 via SUBLINGUAL
  Filled 2021-09-30 (×2): qty 2

## 2021-09-30 MED ORDER — SODIUM CHLORIDE 0.9 % IV SOLN: INTRAVENOUS | Status: DC

## 2021-09-30 MED ORDER — BUPRENORPHINE HCL-NALOXONE HCL 8-2 MG SL SUBL
1.0000 | SUBLINGUAL_TABLET | Freq: Once | SUBLINGUAL | Status: AC
  Administered 2021-09-30: 1 via SUBLINGUAL

## 2021-09-30 MED ORDER — PROPOFOL 10 MG/ML IV BOLUS
INTRAVENOUS | Status: DC | PRN
  Administered 2021-09-30: 40 mg via INTRAVENOUS
  Administered 2021-09-30 (×2): 30 mg via INTRAVENOUS

## 2021-09-30 MED ORDER — OXYCODONE HCL 5 MG PO TABS: 5.0000 mg | ORAL_TABLET | Freq: Once | ORAL | Status: DC | PRN

## 2021-09-30 MED ORDER — SODIUM CHLORIDE 0.9 % IV SOLN
INTRAVENOUS | Status: AC | PRN
  Administered 2021-09-30: 500 mL via INTRAVENOUS

## 2021-09-30 MED ORDER — SALINE FLUSH 0.9 % IV SOLN
1.0000 | Freq: Three times a day (TID) | INTRAVENOUS | 0 refills | Status: DC
  Filled 2021-09-30: qty 90, fill #0

## 2021-09-30 MED ORDER — ONDANSETRON HCL 4 MG/2ML IJ SOLN: 4.0000 mg | Freq: Four times a day (QID) | INTRAMUSCULAR | Status: DC | PRN

## 2021-09-30 MED ORDER — PROPOFOL 500 MG/50ML IV EMUL
INTRAVENOUS | Status: DC | PRN
  Administered 2021-09-30: 150 ug/kg/min via INTRAVENOUS

## 2021-09-30 MED ORDER — OXYCODONE HCL 5 MG/5ML PO SOLN: 5.0000 mg | Freq: Once | ORAL | Status: DC | PRN

## 2021-09-30 MED ORDER — AMOXICILLIN-POT CLAVULANATE 875-125 MG PO TABS
1.0000 | ORAL_TABLET | Freq: Two times a day (BID) | ORAL | 0 refills | Status: AC
  Filled 2021-09-30: qty 34, 17d supply, fill #0

## 2021-09-30 MED ORDER — IBUPROFEN 200 MG PO TABS
800.0000 mg | ORAL_TABLET | ORAL | Status: DC | PRN
  Administered 2021-09-30 – 2021-10-02 (×6): 800 mg via ORAL
  Filled 2021-09-30 (×6): qty 4

## 2021-09-30 MED ORDER — LIDOCAINE HCL (CARDIAC) PF 100 MG/5ML IV SOSY
PREFILLED_SYRINGE | INTRAVENOUS | Status: DC | PRN
  Administered 2021-09-30: 100 mg via INTRATRACHEAL

## 2021-09-30 MED ORDER — NUSHIELD 4 CM X 4 CM EX SHEE
1.0000 | MEDICATED_PATCH | Freq: Three times a day (TID) | CUTANEOUS | 0 refills | Status: DC
  Filled 2021-09-30: qty 180, fill #0

## 2021-09-30 NOTE — Anesthesia Preprocedure Evaluation (Signed)
Anesthesia Evaluation  Patient identified by MRN, date of birth, ID band Patient awake    Reviewed: Allergy & Precautions, H&P , NPO status , Patient's Chart, lab work & pertinent test results  Airway Mallampati: II   Neck ROM: full    Dental   Pulmonary asthma , Current Smoker,    breath sounds clear to auscultation       Cardiovascular  Rhythm:regular Rate:Normal  Suspicion for vegetation   Neuro/Psych    GI/Hepatic (+)     substance abuse  ,   Endo/Other    Renal/GU      Musculoskeletal   Abdominal   Peds  Hematology   Anesthesia Other Findings   Reproductive/Obstetrics                             Anesthesia Physical Anesthesia Plan  ASA: 2  Anesthesia Plan: MAC   Post-op Pain Management:    Induction: Intravenous  PONV Risk Score and Plan: 0 and Propofol infusion and Treatment may vary due to age or medical condition  Airway Management Planned: Nasal Cannula  Additional Equipment:   Intra-op Plan:   Post-operative Plan:   Informed Consent: I have reviewed the patients History and Physical, chart, labs and discussed the procedure including the risks, benefits and alternatives for the proposed anesthesia with the patient or authorized representative who has indicated his/her understanding and acceptance.     Dental advisory given  Plan Discussed with: CRNA, Anesthesiologist and Surgeon  Anesthesia Plan Comments:         Anesthesia Quick Evaluation

## 2021-09-30 NOTE — Progress Notes (Signed)
Patient refused dressing change. Patient was educated and encouraged. Pt. stated that he will do it himself.

## 2021-09-30 NOTE — TOC Initial Note (Addendum)
Transition of Care [facility] - Initial/Assessment Note    Patient Details  Name: Jason Buchanan MRN: [id number] Date of Birth: 1986/02/22  Transition of Care [facility]) CM/SW Contact:    Durenda Guthrie, RN Phone Number: 09/30/2021, 12:05 PM  Clinical Narrative:  Case Manager has arranged appointment for patient with [facility] and wellness for Wednesday, November 9,2022 @3 :50 pm with , PA. MATCH has been entered for medication assistance.   Expected Discharge Plan: Home/Self Care Barriers to Discharge: No Barriers Identified   Patient Goals and CMS Choice        Expected Discharge Plan and Services Expected Discharge Plan: Home/Self Care   Discharge Planning Services: [facility]igent [facility] Clinic, [facility] Program                                          Prior Living Arrangements/Services                       Activities of Daily Living Home Assistive Devices/Equipment: None ADL Screening (condition at time of admission) Patient's cognitive ability adequate to safely complete daily activities?: Yes Is the patient deaf or have difficulty hearing?: No Does the patient have difficulty seeing, even when wearing glasses/contacts?: No Does the patient have difficulty concentrating, remembering, or making decisions?: No Patient able to express need for assistance with ADLs?: Yes Does the patient have difficulty dressing or bathing?: No Independently performs ADLs?: Yes (appropriate for developmental age) Does the patient have difficulty walking or climbing stairs?: No Weakness of Legs: None Weakness of Arms/Hands: None  Permission Sought/Granted                  Emotional Assessment              Admission diagnosis:  Cellulitis and abscess of hand [L03.119, L02.519] IVDU (intravenous drug user) [F19.90] Abscess of dorsum of right hand [L02.511] Patient Active Problem List   Diagnosis Date Noted   Abscess of dorsum of right hand  09/27/2021   Cellulitis and abscess of hand 09/27/2021   IVDU (intravenous drug user)    Sepsis due to cellulitis (HCC) 11/07/2017   Polysubstance abuse (HCC) 11/07/2017   Cocaine abuse (HCC) 11/07/2017   Hypokalemia 11/07/2017   PCP:  Pcp, No Pharmacy:   [facility] - [place], [place] - [address] [place] Phone: [phone] Fax: [phone]  [phone] [facility] [address] [place] Phone: [phone] Fax: [phone]     Social Determinants of Health (SDOH) Interventions    Readmission Risk Interventions No flowsheet data found.

## 2021-09-30 NOTE — Plan of Care (Signed)

## 2021-09-30 NOTE — Plan of Care (Signed)
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
## 2021-09-30 NOTE — Consult Note (Signed)
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  Patient admitted for Right hand infection, s/p I and D. H/O polysubstance abuse.  Objective:  Vital Signs in the last 24 hours: Temp:  [97.7 F (36.5 C)-98.2 F (36.8 C)] 98 F (36.7 C) (10/28 1025) Pulse Rate:  [76-81] 77 (10/28 1025) Resp:  [12-18] 12 (10/28 1025) BP: (129-145)/(58-85) 145/85 (10/28 1025) SpO2:  [98 %-100 %] 98 % (10/28 1025)  Physical Exam: BP Readings from Last 1 Encounters:  09/30/21 (!) 145/85     Wt Readings from Last 1 Encounters:  09/27/21 60 kg    Weight change:  Body mass index is 20.72 kg/m. HEENT: [place]/AT, Eyes-Light Brown, Conjunctiva-Pink, Sclera-Non-icteric Neck: No JVD, No bruit, Trachea midline. Lungs:  Clear, Bilateral. Cardiac:  Regular rhythm, normal S1 and S2, no S3. II/VI systolic murmur. Abdomen:  Soft, non-tender. BS present. Extremities:  No edema present. No cyanosis. No clubbing. Right hand dressing. CNS: AxOx3, Cranial nerves grossly intact, moves all 4 extremities.  Skin: Warm and dry.   Intake/Output from previous day: 10/27 0701 - 10/28 0700 In: 1370 [P.O.:1320; IV Piggyback:50] Out: 225 [Urine:225]    Lab Results: BMET    Component Value Date/Time   NA 138 09/29/2021 0250   NA 136 09/28/2021 0335   NA 133 (L) 09/27/2021 1333   K 4.4 09/29/2021 0250   K 3.3 (L) 09/28/2021 0335   K 3.1 (L) 09/27/2021 1333   CL 110 09/29/2021 0250   CL 103 09/28/2021 0335   CL 97 (L) 09/27/2021 1333   CO2 21 (L) 09/29/2021 0250   CO2 25 09/28/2021 0335   CO2 26 09/27/2021 1333   GLUCOSE 108 (H) 09/29/2021 0250   GLUCOSE 146 (H) 09/28/2021 0335   GLUCOSE 127 (H) 09/27/2021 1333   BUN 7 09/29/2021 0250   BUN 5 (L) 09/28/2021 0335   BUN 7 09/27/2021 1333   CREATININE 0.57 (L) 09/29/2021 0250   CREATININE 0.67 09/28/2021 0335   CREATININE 0.68 09/27/2021 1333   CALCIUM 8.5 (L) 09/29/2021 0250   CALCIUM 8.1 (L) 09/28/2021 0335   CALCIUM 8.3 (L) 09/27/2021 1333    GFRNONAA >60 09/29/2021 0250   GFRNONAA >60 09/28/2021 0335   GFRNONAA >60 09/27/2021 1333   GFRAA >60 11/09/2017 0552   GFRAA >60 11/08/2017 0606   GFRAA >60 11/07/2017 0800   CBC    Component Value Date/Time   WBC 10.6 (H) 09/29/2021 0250   RBC 4.37 09/29/2021 0250   HGB 13.0 09/29/2021 0250   HCT 39.1 09/29/2021 0250   PLT 476 (H) 09/29/2021 0250   MCV 89.5 09/29/2021 0250   MCH 29.7 09/29/2021 0250   MCHC 33.2 09/29/2021 0250   RDW 12.1 09/29/2021 0250   LYMPHSABS 2.4 09/29/2021 0250   MONOABS 0.8 09/29/2021 0250   EOSABS 0.6 (H) 09/29/2021 0250   BASOSABS 0.1 09/29/2021 0250   HEPATIC Function Panel Recent Labs    09/29/21 0250  PROT 6.2*   HEMOGLOBIN A1C No components found for: HGA1C,  MPG CARDIAC ENZYMES Lab Results  Component Value Date   CKTOTAL 1,121 (H) 11/07/2017   BNP No results for input(s): PROBNP in the last 8760 hours. TSH No results for input(s): TSH in the last 8760 hours. CHOLESTEROL No results for input(s): CHOL in the last 8760 hours.  Scheduled Meds:  [MAR Hold] acidophilus  2 capsule Oral TID   [MAR Hold] buprenorphine-naloxone  1 tablet Sublingual Once   [MAR Hold] buprenorphine-naloxone  2 tablet  Sublingual Daily   [MAR Hold] clindamycin  300 mg Oral Q8H   [MAR Hold] folic acid  1 mg Oral Daily   [MAR Hold] multivitamin with minerals  1 tablet Oral Daily   [MAR Hold] nicotine  14 mg Transdermal Daily   [MAR Hold] thiamine  100 mg Oral Daily   Or   [MAR Hold] thiamine  100 mg Intravenous Daily   Continuous Infusions: PRN Meds:.[MAR Hold] acetaminophen **OR** [MAR Hold] acetaminophen, [MAR Hold] cloNIDine, [MAR Hold] dicyclomine, [MAR Hold] hydrOXYzine, [MAR Hold] ketorolac, [MAR Hold] LORazepam **OR** [MAR Hold] LORazepam, [MAR Hold] ondansetron **OR** [MAR Hold] ondansetron (ZOFRAN) IV  Assessment/Plan: Right hand abscess S/P right hand incision and drainage 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 Patient understood procedure and risks and consents to the procedure.    LOS: 3 days   Time spent including chart review, lab review, examination, discussion with patient/Nurse : 30 min   Orpah Cobb  MD  09/30/2021, 10:33 AM

## 2021-09-30 NOTE — CV Procedure (Signed)
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  Risks include, but are not limited to, cough, sore throat, vomiting, nausea, somnolence, esophageal and stomach trauma or perforation, bleeding, low blood pressure, aspiration, pneumonia, infection, trauma to the teeth and death.    Patient was given sedation.  The oropharynx was anesthetized with topical lidocaine.  The transesophageal probe was inserted in the esophagus and stomach and multiple views were obtained.  Agitated saline was used after the transesophageal probe was removed from the body.  The patient was kept under observation until the patient left the procedure room.  The patient left the procedure room in stable condition.   COMPLICATIONS:  There were no immediate complications.  FINDINGS:  LEFT VENTRICLE: The left ventricle is normal in structure and function.  Wall motion is normal.  No thrombus or masses seen in the left ventricle.  RIGHT VENTRICLE:  The right ventricle is normal in structure and function without any thrombus or masses.    LEFT ATRIUM:  The left atrium is normal without any thrombus or masses.  LEFT ATRIAL APPENDAGE:  The left atrial appendage is free of any thrombus or masses.  RIGHT ATRIUM:  The right atrium is free of any thrombus or masses.    ATRIAL SEPTUM:  The atrial septum is normal without any ASD or PFO.  MITRAL VALVE:  The mitral valve is normal in structure and function with trivial regurgitation, no masses, stenosis or vegetations.  TRICUSPID VALVE:  The tricuspid valve is normal in structure and function with trivial regurgitation, no masses, stenosis or vegetations.  AORTIC VALVE:  The aortic valve is normal in structure and function with trivial regurgitation, masses, stenosis or vegetations.  PULMONIC VALVE:  The pulmonic valve  is normal in structure and function without regurgitation, masses, stenosis or vegetations.  AORTIC ARCH, ASCENDING AND DESCENDING AORTA:  The aorta had minimal atherosclerosis in the ascending or descending aorta.  The aortic arch was normal.   Superior Vena Cava : No thrombus or catheter.   Pulmonary Veins: Visible.   Pulmonary artery: visible and normal.   IMPRESSION:   Normal LV systolic function Minimal MR, TR and AI. No PFO/ASD  RECOMMENDATIONS:    None.

## 2021-09-30 NOTE — Transfer of Care (Signed)
Immediate Anesthesia Transfer of Care Note  Patient: Jason Buchanan  Procedure(s) Performed: TRANSESOPHAGEAL ECHOCARDIOGRAM (TEE) BUBBLE STUDY  Patient Location: Endoscopy Unit  Anesthesia Type:MAC  Level of Consciousness: drowsy and patient cooperative  Airway & Oxygen Therapy: Patient Spontanous Breathing and Patient connected to nasal cannula oxygen  Post-op Assessment: Report given to RN and Post -op Vital signs reviewed and stable  Post vital signs: Reviewed and stable  Last Vitals:  Vitals Value Taken Time  BP 95/41 09/30/21 1133  Temp    Pulse 68 09/30/21 1134  Resp 25 09/30/21 1134  SpO2 98 % 09/30/21 1134  Vitals shown include unvalidated device data.  Last Pain:  Vitals:   09/30/21 1025  TempSrc: Temporal  PainSc:       Patients Stated Pain Goal: 3 (81/15/72 6203)  Complications: No notable events documented.
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
## 2021-09-30 NOTE — Progress Notes (Signed)
Mobility Specialist Progress Note    09/30/21 1355  Mobility  Activity Ambulated in hall  Level of Assistance Independent  Assistive Device None  Distance Ambulated (ft) 560 ft  Mobility Ambulated independently in hallway  Mobility Response Tolerated well  Mobility performed by Mobility specialist  $Mobility charge 1 Mobility   Pt received in bed and agreeable. C/o stabbing pain in R knee and had a limp during walk. Returned to bed with call bell in reach.   [place] Mobility Specialist  Mobility Specialist Phone: [phone]

## 2021-09-30 NOTE — Progress Notes (Signed)
Echocardiogram Echocardiogram Transesophageal has been performed.  Warren Lacy Karris Deangelo RDCS 09/30/2021, 11:54 AM

## 2021-10-01 NOTE — Progress Notes (Signed)
Pt seen in room and educated the importance of scheduled wound care at 1600hrs, but pt refused. Per pt he will do it tonight before he sleeps.

## 2021-10-01 NOTE — Plan of Care (Signed)

## 2021-10-01 NOTE — Anesthesia Postprocedure Evaluation (Signed)
Anesthesia Post Note  Patient: Jason Buchanan  Procedure(s) Performed: TRANSESOPHAGEAL ECHOCARDIOGRAM (TEE) BUBBLE STUDY     Patient location during evaluation: PACU Anesthesia Type: MAC Level of consciousness: awake and alert Pain management: pain level controlled Vital Signs Assessment: post-procedure vital signs reviewed and stable Respiratory status: spontaneous breathing, nonlabored ventilation, respiratory function stable and patient connected to nasal cannula oxygen Cardiovascular status: stable and blood pressure returned to baseline Postop Assessment: no apparent nausea or vomiting Anesthetic complications: no   No notable events documented.  Last Vitals:  Vitals:   09/30/21 1422 09/30/21 2040  BP: 126/74 129/79  Pulse: 80 79  Resp: 17 18  Temp: 36.5 C 36.7 C  SpO2: 99% 99%    Last Pain:  Vitals:   10/01/21 0626  TempSrc:   PainSc: [place]

## 2021-10-01 NOTE — Progress Notes (Signed)
Seen and examined-patient grateful for the care he has received and will be ready for discharge once he gets his a.m. dose of Suboxone 10/30-cannot discharge safely coordination of this in the outpatient setting until that time  Pleas Koch, MD [facility] 10:18 AM

## 2021-10-01 NOTE — Progress Notes (Signed)
Patient refused dressing change this AM. Patient educated  and encourages. Patient stated that he will do it during the day.

## 2021-10-02 LAB — CULTURE, BLOOD (ROUTINE X 2): Culture: NO GROWTH

## 2021-10-02 NOTE — TOC Transition Note (Signed)
Transition of Care [facility]) - CM/SW Discharge Note   Patient Details  Name: Jason Buchanan MRN: [id number] Date of Birth: 05-03-1986  Transition of Care [facility] CM/SW Contact:  Lorri Frederick, LCSW Phone Number: 10/02/2021, 11:57 AM   Clinical Narrative:   CSW scheduled [employer] taxi for 1pm.  Pt requesting transport to friend's home at [employer], [place].  Pt discharging home to self care.  PCP appt has been scheduled.  Pt is in contact with both [facility] and [facility] regarding admission for residential substance abuse treatment and states he will follow up after discharge.      Final next level of care: Home/Self Care Barriers to Discharge: No Barriers Identified   Patient Goals and CMS Choice        Discharge Placement                       Discharge Plan and Services   Discharge Planning Services: [facility], [facility]            DME Arranged: N/A           HH Agency: NA        Social Determinants of Health (SDOH) Interventions     Readmission Risk Interventions No flowsheet data found.

## 2021-10-02 NOTE — Progress Notes (Signed)
Discharge summary packet provided to pt with instructions. Pt verbalized understanding of instructions.Pt d/c as ordered. Pt remains alert/oriented in no apparent distress. Ambulates without difficulty. [employer] taxicab will provide his ride. Pt has no complaints.

## 2021-10-02 NOTE — Progress Notes (Signed)
Patient refused dressing change. Patient educated and encouraged.

## 2021-10-02 NOTE — Progress Notes (Signed)
Patient seen and examined and stabilized for discharge after he gets his morning dose of Suboxone He understands that he needs to follow-up at the Suboxone clinic No changes to discharge summary  No charge Pleas Koch, MD [facility] 8:52 AM

## 2021-10-03 ENCOUNTER — Encounter (HOSPITAL_COMMUNITY): Payer: Self-pay | Admitting: Cardiovascular Disease
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
## 2021-10-12 ENCOUNTER — Inpatient Hospital Stay: Payer: Self-pay | Admitting: Physician Assistant
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
## 2021-11-02 ENCOUNTER — Inpatient Hospital Stay: Payer: Self-pay | Admitting: Physician Assistant
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
## 2021-11-14 ENCOUNTER — Emergency Department (HOSPITAL_COMMUNITY): Payer: 59

## 2021-11-14 ENCOUNTER — Encounter (HOSPITAL_COMMUNITY): Payer: Self-pay | Admitting: Emergency Medicine

## 2021-11-14 ENCOUNTER — Inpatient Hospital Stay (HOSPITAL_COMMUNITY): Payer: 59

## 2021-11-14 ENCOUNTER — Other Ambulatory Visit: Payer: Self-pay

## 2021-11-14 ENCOUNTER — Inpatient Hospital Stay (HOSPITAL_COMMUNITY)
Admission: EM | Admit: 2021-11-14 | Discharge: 2022-01-18 | DRG: 853 | Disposition: A | Discharge status: Home / Self Care | Payer: 59 | Attending: Internal Medicine | Admitting: Internal Medicine

## 2021-11-14 DIAGNOSIS — B955 Unspecified streptococcus as the cause of diseases classified elsewhere: Secondary | ICD-10-CM | POA: Diagnosis not present

## 2021-11-14 DIAGNOSIS — J95821 Acute postprocedural respiratory failure: Secondary | ICD-10-CM

## 2021-11-14 DIAGNOSIS — Z20822 Contact with and (suspected) exposure to covid-19: Secondary | ICD-10-CM | POA: Diagnosis present

## 2021-11-14 DIAGNOSIS — F15951 Other stimulant use, unspecified with stimulant-induced psychotic disorder with hallucinations: Secondary | ICD-10-CM | POA: Diagnosis present

## 2021-11-14 DIAGNOSIS — B182 Chronic viral hepatitis C: Secondary | ICD-10-CM | POA: Diagnosis present

## 2021-11-14 DIAGNOSIS — J45909 Unspecified asthma, uncomplicated: Secondary | ICD-10-CM | POA: Diagnosis present

## 2021-11-14 DIAGNOSIS — Z452 Encounter for adjustment and management of vascular access device: Secondary | ICD-10-CM | POA: Diagnosis not present

## 2021-11-14 DIAGNOSIS — L02511 Cutaneous abscess of right hand: Secondary | ICD-10-CM | POA: Diagnosis present

## 2021-11-14 DIAGNOSIS — F1990 Other psychoactive substance use, unspecified, uncomplicated: Secondary | ICD-10-CM | POA: Diagnosis not present

## 2021-11-14 DIAGNOSIS — L089 Local infection of the skin and subcutaneous tissue, unspecified: Secondary | ICD-10-CM | POA: Diagnosis not present

## 2021-11-14 DIAGNOSIS — F17210 Nicotine dependence, cigarettes, uncomplicated: Secondary | ICD-10-CM | POA: Diagnosis present

## 2021-11-14 DIAGNOSIS — L0390 Cellulitis, unspecified: Secondary | ICD-10-CM | POA: Diagnosis not present

## 2021-11-14 DIAGNOSIS — F1110 Opioid abuse, uncomplicated: Secondary | ICD-10-CM | POA: Diagnosis present

## 2021-11-14 DIAGNOSIS — K254 Chronic or unspecified gastric ulcer with hemorrhage: Secondary | ICD-10-CM | POA: Diagnosis present

## 2021-11-14 DIAGNOSIS — R7881 Bacteremia: Secondary | ICD-10-CM

## 2021-11-14 DIAGNOSIS — L03113 Cellulitis of right upper limb: Secondary | ICD-10-CM | POA: Diagnosis not present

## 2021-11-14 DIAGNOSIS — A409 Streptococcal sepsis, unspecified: Principal | ICD-10-CM | POA: Diagnosis present

## 2021-11-14 DIAGNOSIS — L02512 Cutaneous abscess of left hand: Secondary | ICD-10-CM | POA: Diagnosis present

## 2021-11-14 DIAGNOSIS — F1910 Other psychoactive substance abuse, uncomplicated: Secondary | ICD-10-CM | POA: Diagnosis not present

## 2021-11-14 DIAGNOSIS — B9561 Methicillin susceptible Staphylococcus aureus infection as the cause of diseases classified elsewhere: Secondary | ICD-10-CM | POA: Diagnosis present

## 2021-11-14 DIAGNOSIS — L02419 Cutaneous abscess of limb, unspecified: Secondary | ICD-10-CM | POA: Diagnosis not present

## 2021-11-14 DIAGNOSIS — E43 Unspecified severe protein-calorie malnutrition: Secondary | ICD-10-CM | POA: Diagnosis present

## 2021-11-14 DIAGNOSIS — L03114 Cellulitis of left upper limb: Secondary | ICD-10-CM | POA: Diagnosis present

## 2021-11-14 DIAGNOSIS — I351 Nonrheumatic aortic (valve) insufficiency: Secondary | ICD-10-CM | POA: Diagnosis present

## 2021-11-14 DIAGNOSIS — G5602 Carpal tunnel syndrome, left upper limb: Secondary | ICD-10-CM | POA: Diagnosis present

## 2021-11-14 DIAGNOSIS — A4901 Methicillin susceptible Staphylococcus aureus infection, unspecified site: Secondary | ICD-10-CM

## 2021-11-14 DIAGNOSIS — A419 Sepsis, unspecified organism: Secondary | ICD-10-CM | POA: Diagnosis not present

## 2021-11-14 DIAGNOSIS — E871 Hypo-osmolality and hyponatremia: Secondary | ICD-10-CM | POA: Diagnosis present

## 2021-11-14 DIAGNOSIS — L02413 Cutaneous abscess of right upper limb: Secondary | ICD-10-CM | POA: Diagnosis present

## 2021-11-14 DIAGNOSIS — K922 Gastrointestinal hemorrhage, unspecified: Secondary | ICD-10-CM | POA: Diagnosis not present

## 2021-11-14 DIAGNOSIS — R6520 Severe sepsis without septic shock: Secondary | ICD-10-CM | POA: Diagnosis present

## 2021-11-14 DIAGNOSIS — F319 Bipolar disorder, unspecified: Secondary | ICD-10-CM | POA: Diagnosis present

## 2021-11-14 DIAGNOSIS — K2091 Esophagitis, unspecified with bleeding: Secondary | ICD-10-CM | POA: Diagnosis not present

## 2021-11-14 DIAGNOSIS — T79A12A Traumatic compartment syndrome of left upper extremity, initial encounter: Secondary | ICD-10-CM | POA: Diagnosis present

## 2021-11-14 DIAGNOSIS — F1594 Other stimulant use, unspecified with stimulant-induced mood disorder: Secondary | ICD-10-CM

## 2021-11-14 DIAGNOSIS — L03119 Cellulitis of unspecified part of limb: Secondary | ICD-10-CM | POA: Diagnosis not present

## 2021-11-14 DIAGNOSIS — R768 Other specified abnormal immunological findings in serum: Secondary | ICD-10-CM | POA: Diagnosis not present

## 2021-11-14 DIAGNOSIS — B950 Streptococcus, group A, as the cause of diseases classified elsewhere: Secondary | ICD-10-CM | POA: Diagnosis present

## 2021-11-14 DIAGNOSIS — Z79899 Other long term (current) drug therapy: Secondary | ICD-10-CM

## 2021-11-14 DIAGNOSIS — D509 Iron deficiency anemia, unspecified: Secondary | ICD-10-CM | POA: Diagnosis present

## 2021-11-14 DIAGNOSIS — J154 Pneumonia due to other streptococci: Secondary | ICD-10-CM | POA: Diagnosis not present

## 2021-11-14 DIAGNOSIS — J9601 Acute respiratory failure with hypoxia: Secondary | ICD-10-CM | POA: Diagnosis not present

## 2021-11-14 DIAGNOSIS — A491 Streptococcal infection, unspecified site: Secondary | ICD-10-CM | POA: Diagnosis not present

## 2021-11-14 DIAGNOSIS — R471 Dysarthria and anarthria: Secondary | ICD-10-CM | POA: Diagnosis present

## 2021-11-14 DIAGNOSIS — D75838 Other thrombocytosis: Secondary | ICD-10-CM | POA: Diagnosis present

## 2021-11-14 DIAGNOSIS — B951 Streptococcus, group B, as the cause of diseases classified elsewhere: Secondary | ICD-10-CM | POA: Diagnosis present

## 2021-11-14 DIAGNOSIS — D62 Acute posthemorrhagic anemia: Secondary | ICD-10-CM | POA: Diagnosis not present

## 2021-11-14 DIAGNOSIS — L02519 Cutaneous abscess of unspecified hand: Secondary | ICD-10-CM | POA: Diagnosis not present

## 2021-11-14 DIAGNOSIS — E878 Other disorders of electrolyte and fluid balance, not elsewhere classified: Secondary | ICD-10-CM | POA: Diagnosis not present

## 2021-11-14 DIAGNOSIS — E876 Hypokalemia: Secondary | ICD-10-CM | POA: Diagnosis present

## 2021-11-14 DIAGNOSIS — L02414 Cutaneous abscess of left upper limb: Secondary | ICD-10-CM | POA: Diagnosis present

## 2021-11-14 DIAGNOSIS — K269 Duodenal ulcer, unspecified as acute or chronic, without hemorrhage or perforation: Secondary | ICD-10-CM | POA: Diagnosis present

## 2021-11-14 DIAGNOSIS — K2970 Gastritis, unspecified, without bleeding: Secondary | ICD-10-CM | POA: Diagnosis present

## 2021-11-14 DIAGNOSIS — M7989 Other specified soft tissue disorders: Secondary | ICD-10-CM | POA: Diagnosis present

## 2021-11-14 DIAGNOSIS — S41101A Unspecified open wound of right upper arm, initial encounter: Secondary | ICD-10-CM | POA: Diagnosis not present

## 2021-11-14 DIAGNOSIS — Z5900 Homelessness unspecified: Secondary | ICD-10-CM

## 2021-11-14 DIAGNOSIS — G5601 Carpal tunnel syndrome, right upper limb: Secondary | ICD-10-CM | POA: Diagnosis present

## 2021-11-14 DIAGNOSIS — Z4659 Encounter for fitting and adjustment of other gastrointestinal appliance and device: Secondary | ICD-10-CM

## 2021-11-14 DIAGNOSIS — Z9114 Patient's other noncompliance with medication regimen: Secondary | ICD-10-CM

## 2021-11-14 DIAGNOSIS — F419 Anxiety disorder, unspecified: Secondary | ICD-10-CM | POA: Diagnosis present

## 2021-11-14 LAB — URINALYSIS, ROUTINE W REFLEX MICROSCOPIC
Bacteria, UA: NONE SEEN
Bilirubin Urine: NEGATIVE
Glucose, UA: NEGATIVE mg/dL
Ketones, ur: 5 mg/dL — AB
Leukocytes,Ua: NEGATIVE
Nitrite: NEGATIVE
Protein, ur: NEGATIVE mg/dL
Specific Gravity, Urine: 1.018 (ref 1.005–1.030)
pH: 7 (ref 5.0–8.0)

## 2021-11-14 LAB — COMPREHENSIVE METABOLIC PANEL
ALT: 28 U/L (ref 0–44)
AST: 41 U/L (ref 15–41)
Albumin: 2.6 g/dL — ABNORMAL LOW (ref 3.5–5.0)
Alkaline Phosphatase: 121 U/L (ref 38–126)
Anion gap: 16 — ABNORMAL HIGH (ref 5–15)
BUN: 45 mg/dL — ABNORMAL HIGH (ref 6–20)
CO2: 23 mmol/L (ref 22–32)
Calcium: 8.4 mg/dL — ABNORMAL LOW (ref 8.9–10.3)
Chloride: 91 mmol/L — ABNORMAL LOW (ref 98–111)
Creatinine, Ser: 1.19 mg/dL (ref 0.61–1.24)
GFR, Estimated: >60 mL/min (ref >60)
Glucose, Bld: 75 mg/dL (ref 70–99)
Potassium: 3 mmol/L — ABNORMAL LOW (ref 3.5–5.1)
Sodium: 130 mmol/L — ABNORMAL LOW (ref 135–145)
Total Bilirubin: 1.4 mg/dL — ABNORMAL HIGH (ref 0.3–1.2)
Total Protein: 6.5 g/dL (ref 6.5–8.1)

## 2021-11-14 LAB — CBC WITH DIFFERENTIAL/PLATELET
Abs Immature Granulocytes: 1.9 10*3/uL — ABNORMAL HIGH (ref 0.00–0.07)
Band Neutrophils: 21 %
Basophils Absolute: 0 10*3/uL (ref 0.0–0.1)
Basophils Relative: 0 %
Eosinophils Absolute: 0 10*3/uL (ref 0.0–0.5)
Eosinophils Relative: 0 %
HCT: 41.9 % (ref 39.0–52.0)
Hemoglobin: 13.9 g/dL (ref 13.0–17.0)
Lymphocytes Relative: 5 %
Lymphs Abs: 1.4 10*3/uL (ref 0.7–4.0)
MCH: 28 pg (ref 26.0–34.0)
MCHC: 33.2 g/dL (ref 30.0–36.0)
MCV: 84.3 fL (ref 80.0–100.0)
Metamyelocytes Relative: 2 %
Monocytes Absolute: 0.6 10*3/uL (ref 0.1–1.0)
Monocytes Relative: 2 %
Myelocytes: 5 %
Neutro Abs: 23.7 10*3/uL — ABNORMAL HIGH (ref 1.7–7.7)
Neutrophils Relative %: 65 %
Platelets: 321 10*3/uL (ref 150–400)
RBC: 4.97 MIL/uL (ref 4.22–5.81)
RDW: 14.5 % (ref 11.5–15.5)
WBC Morphology: INCREASED
WBC: 27.6 10*3/uL — ABNORMAL HIGH (ref 4.0–10.5)
nRBC: 0 % (ref 0.0–0.2)

## 2021-11-14 LAB — RAPID URINE DRUG SCREEN, HOSP PERFORMED
Amphetamines: POSITIVE — AB
Barbiturates: NOT DETECTED
Benzodiazepines: NOT DETECTED
Cocaine: NOT DETECTED
Opiates: NOT DETECTED
Tetrahydrocannabinol: NOT DETECTED

## 2021-11-14 LAB — PROTIME-INR
INR: 1.1 (ref 0.8–1.2)
Prothrombin Time: 13.9 seconds (ref 11.4–15.2)

## 2021-11-14 LAB — APTT: aPTT: 28 seconds (ref 24–36)

## 2021-11-14 LAB — RESP PANEL BY RT-PCR (FLU A&B, COVID) ARPGX2
Influenza A by PCR: NEGATIVE
Influenza B by PCR: NEGATIVE
SARS Coronavirus 2 by RT PCR: NEGATIVE

## 2021-11-14 LAB — MAGNESIUM: Magnesium: 1.6 mg/dL — ABNORMAL LOW (ref 1.7–2.4)

## 2021-11-14 LAB — ETHANOL: Alcohol, Ethyl (B): <10 mg/dL (ref <10)

## 2021-11-14 LAB — CK: Total CK: 76 U/L (ref 49–397)

## 2021-11-14 LAB — LACTIC ACID, PLASMA
Lactic Acid, Venous: 3.5 mmol/L (ref 0.5–1.9)
Lactic Acid, Venous: 2.4 mmol/L (ref 0.5–1.9)

## 2021-11-14 MED ORDER — LACTATED RINGERS IV BOLUS (SEPSIS)
1000.0000 mL | Freq: Once | INTRAVENOUS | Status: AC
  Administered 2021-11-14: 1000 mL via INTRAVENOUS

## 2021-11-14 MED ORDER — MORPHINE SULFATE (PF) 4 MG/ML IV SOLN
4.0000 mg | INTRAVENOUS | Status: DC | PRN
  Administered 2021-11-14 – 2021-11-17 (×9): 4 mg via INTRAVENOUS
  Filled 2021-11-14 (×9): qty 1

## 2021-11-14 MED ORDER — ACETAMINOPHEN 325 MG PO TABS: 650.0000 mg | ORAL_TABLET | Freq: Four times a day (QID) | ORAL | Status: DC | PRN

## 2021-11-14 MED ORDER — ACETAMINOPHEN 650 MG RE SUPP: 650.0000 mg | Freq: Four times a day (QID) | RECTAL | Status: DC | PRN

## 2021-11-14 MED ORDER — POTASSIUM CHLORIDE 10 MEQ/100ML IV SOLN
10.0000 meq | Freq: Once | INTRAVENOUS | Status: AC
  Administered 2021-11-14: 10 meq via INTRAVENOUS
  Filled 2021-11-14: qty 100

## 2021-11-14 MED ORDER — SODIUM CHLORIDE 0.9 % IV SOLN
2.0000 g | Freq: Once | INTRAVENOUS | Status: AC
  Administered 2021-11-14: 2 g via INTRAVENOUS
  Filled 2021-11-14: qty 20

## 2021-11-14 MED ORDER — IOHEXOL 350 MG/ML SOLN
100.0000 mL | Freq: Once | INTRAVENOUS | Status: AC | PRN
  Administered 2021-11-14: 100 mL via INTRAVENOUS

## 2021-11-14 MED ORDER — VANCOMYCIN HCL 750 MG/150ML IV SOLN
750.0000 mg | Freq: Two times a day (BID) | INTRAVENOUS | Status: DC
  Administered 2021-11-14: 750 mg via INTRAVENOUS
  Filled 2021-11-14 (×2): qty 150

## 2021-11-14 MED ORDER — SODIUM CHLORIDE 0.9 % IV SOLN: INTRAVENOUS | Status: DC

## 2021-11-14 MED ORDER — VANCOMYCIN HCL IN DEXTROSE 1-5 GM/200ML-% IV SOLN
1000.0000 mg | Freq: Once | INTRAVENOUS | Status: AC
  Administered 2021-11-14: 1000 mg via INTRAVENOUS
  Filled 2021-11-14: qty 200

## 2021-11-14 MED ORDER — ONDANSETRON HCL 4 MG/2ML IJ SOLN
4.0000 mg | Freq: Four times a day (QID) | INTRAMUSCULAR | Status: DC | PRN
  Administered 2021-11-17 – 2022-01-03 (×3): 4 mg via INTRAVENOUS
  Filled 2021-11-14 (×2): qty 2

## 2021-11-14 MED ORDER — FENTANYL CITRATE PF 50 MCG/ML IJ SOSY
50.0000 ug | PREFILLED_SYRINGE | Freq: Once | INTRAMUSCULAR | Status: AC
  Administered 2021-11-14: 50 ug via INTRAVENOUS
  Filled 2021-11-14: qty 1

## 2021-11-14 MED ORDER — OXYCODONE HCL 5 MG PO TABS: 5.0000 mg | ORAL_TABLET | ORAL | Status: DC | PRN

## 2021-11-14 MED ORDER — POTASSIUM CHLORIDE CRYS ER 20 MEQ PO TBCR
40.0000 meq | EXTENDED_RELEASE_TABLET | Freq: Two times a day (BID) | ORAL | Status: AC
  Administered 2021-11-14 – 2021-11-15 (×3): 40 meq via ORAL
  Filled 2021-11-14 (×3): qty 2

## 2021-11-14 MED ORDER — LACTATED RINGERS IV SOLN: INTRAVENOUS | Status: DC

## 2021-11-14 MED ORDER — SODIUM CHLORIDE 0.9 % IV SOLN: 2.0000 g | INTRAVENOUS | Status: DC

## 2021-11-14 MED ORDER — KETOROLAC TROMETHAMINE 30 MG/ML IJ SOLN: 30.0000 mg | Freq: Four times a day (QID) | INTRAMUSCULAR | Status: DC | PRN

## 2021-11-14 MED ORDER — ONDANSETRON HCL 4 MG PO TABS: 4.0000 mg | ORAL_TABLET | Freq: Four times a day (QID) | ORAL | Status: DC | PRN

## 2021-11-14 NOTE — ED Notes (Signed)
Second set of blood cultures obtained at 0943.

## 2021-11-14 NOTE — Plan of Care (Signed)
  Problem: Education: Goal: Knowledge of General Education information will improve Description: Including pain rating scale, medication(s)/side effects and non-pharmacologic comfort measures Outcome: Progressing   Problem: Clinical Measurements: Goal: Ability to maintain clinical measurements within normal limits will improve Outcome: Progressing Goal: Diagnostic test results will improve Outcome: Progressing   Problem: Coping: Goal: Level of anxiety will decrease Outcome: Progressing   Problem: Pain Managment: Goal: General experience of comfort will improve Outcome: Progressing   Problem: Safety: Goal: Ability to remain free from injury will improve Outcome: Progressing   Problem: Skin Integrity: Goal: Risk for impaired skin integrity will decrease Outcome: Progressing   Problem: Fluid Volume: Goal: Hemodynamic stability will improve Outcome: Progressing   Problem: Respiratory: Goal: Ability to maintain adequate ventilation will improve Outcome: Progressing
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
## 2021-11-14 NOTE — Progress Notes (Signed)
H&P addendum .  Pt seen and evaluated.  Severe, diffuse swelling of RUE with obvious abscess volar wrist with skin compromise.  Intense erythema dorsal hand with swelling as well.  Forearm with diffuse swelling , obvious abscess upper forearm.  Some superficial blistering of skin forearm.  Erythema extends to mid upper arm without obvious abscess here. Left Hand with erythema dorsal along 2nd mcpj, some volar hand swelling as well. Because of the complexity of RUE infection, current sepsis; will attempt to transfer to another facility more capable of handling this process.
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
## 2021-11-14 NOTE — Sepsis Progress Note (Signed)
eLink monitoring code sepsis.  

## 2021-11-14 NOTE — ED Notes (Signed)
Pt transported to CT ?

## 2021-11-14 NOTE — Progress Notes (Signed)
Pharmacy Antibiotic Note  Jason Buchanan is a 35 y.o. male admitted on 11/14/2021 with cellulitis.  Pharmacy has been consulted for vanc dosing.  Plan: Vanc 1g x 1 given at 0943 in ER Start vanc 750mg  IV q12 thereafter - goal AUC 400-550  Height: 5\' 7"  (170.2 cm) Weight: 60 kg (132 lb 4.4 oz) IBW/kg (Calculated) : 66.1  Temp (24hrs), Avg:97.1 F (36.2 C), Min:96.1 F (35.6 C), Max:98 F (36.7 C)  Recent Labs  Lab 11/14/21 0854 11/14/21 1135  WBC 27.6*  --   CREATININE 1.19  --   LATICACIDVEN 3.5* 2.4*    Estimated Creatinine Clearance: 73.5 mL/min (by C-G formula based on SCr of 1.19 mg/dL).    Allergies  Allergen Reactions   Bee Venom Anaphylaxis and Swelling    Swelling all over     Thank you for allowing pharmacy to be a part of this patient's care.  14/12/22 11/14/2021 3:15 PM

## 2021-11-14 NOTE — Sepsis Progress Note (Signed)
Notified bedside nurse of need to draw repeat lactic acid. 

## 2021-11-14 NOTE — Progress Notes (Signed)
A consult was received from an ED physician for vancomycin per pharmacy dosing (for an indication other than meningitis). The patient's profile has been reviewed for ht/wt/allergies/indication/available labs. A one time order has been placed for the above antibiotics.  Further antibiotics/pharmacy consults should be ordered by admitting physician if indicated.                       Bernadene Person, PharmD, BCPS [phone] 11/14/2021, 9:04 AM
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
## 2021-11-14 NOTE — Progress Notes (Signed)
Patient admitted for sepsis secondary to right UE cellulitis. Patient was moved upstairs to inpt services before notification received that an ED-to ED transfer would be attempted. As such, patient is no longer able to be transferred to [facility] ED by this method. There are no inpt transfer slots available to [facility] at this time. Will continue current Tx while awaiting additional surgical plan. Appreciate all involved in this effort.   Teddy Spike, DO

## 2021-11-14 NOTE — ED Notes (Signed)
Only one blood culture set able to obtained due to restriction of extremities.

## 2021-11-15 ENCOUNTER — Encounter (HOSPITAL_COMMUNITY)
Admission: EM | Disposition: A | Discharge status: Home / Self Care | Payer: Self-pay | Source: Home / Self Care | Attending: Internal Medicine

## 2021-11-15 ENCOUNTER — Inpatient Hospital Stay (HOSPITAL_COMMUNITY): Payer: 59 | Admitting: Certified Registered Nurse Anesthetist

## 2021-11-15 ENCOUNTER — Encounter (HOSPITAL_COMMUNITY): Payer: Self-pay | Admitting: Internal Medicine

## 2021-11-15 DIAGNOSIS — A419 Sepsis, unspecified organism: Secondary | ICD-10-CM | POA: Diagnosis not present

## 2021-11-15 HISTORY — PX: I & D EXTREMITY: SHX5045

## 2021-11-15 LAB — BLOOD CULTURE ID PANEL (REFLEXED) - BCID2

## 2021-11-15 LAB — COMPREHENSIVE METABOLIC PANEL
ALT: 20 U/L (ref 0–44)
AST: 37 U/L (ref 15–41)
Albumin: 1.6 g/dL — ABNORMAL LOW (ref 3.5–5.0)
Alkaline Phosphatase: 96 U/L (ref 38–126)
Anion gap: 10 (ref 5–15)
BUN: 48 mg/dL — ABNORMAL HIGH (ref 6–20)
CO2: 23 mmol/L (ref 22–32)
Calcium: 7.5 mg/dL — ABNORMAL LOW (ref 8.9–10.3)
Chloride: 98 mmol/L (ref 98–111)
Creatinine, Ser: 0.87 mg/dL (ref 0.61–1.24)
GFR, Estimated: >60 mL/min (ref >60)
Glucose, Bld: 148 mg/dL — ABNORMAL HIGH (ref 70–99)
Potassium: 3.5 mmol/L (ref 3.5–5.1)
Sodium: 131 mmol/L — ABNORMAL LOW (ref 135–145)
Total Bilirubin: 1.4 mg/dL — ABNORMAL HIGH (ref 0.3–1.2)
Total Protein: 4.5 g/dL — ABNORMAL LOW (ref 6.5–8.1)

## 2021-11-15 LAB — CBC
HCT: 28 % — ABNORMAL LOW (ref 39.0–52.0)
Hemoglobin: 9.7 g/dL — ABNORMAL LOW (ref 13.0–17.0)
MCH: 28.7 pg (ref 26.0–34.0)
MCHC: 34.6 g/dL (ref 30.0–36.0)
MCV: 82.8 fL (ref 80.0–100.0)
Platelets: 313 10*3/uL (ref 150–400)
RBC: 3.38 MIL/uL — ABNORMAL LOW (ref 4.22–5.81)
RDW: 14.6 % (ref 11.5–15.5)
WBC: 37.4 10*3/uL — ABNORMAL HIGH (ref 4.0–10.5)
nRBC: 0.1 % (ref 0.0–0.2)

## 2021-11-15 LAB — PROCALCITONIN: Procalcitonin: 7.95 ng/mL

## 2021-11-15 LAB — PROTIME-INR
INR: 1.2 (ref 0.8–1.2)
Prothrombin Time: 15.4 seconds — ABNORMAL HIGH (ref 11.4–15.2)

## 2021-11-15 LAB — CORTISOL-AM, BLOOD: Cortisol - AM: 39.7 ug/dL — ABNORMAL HIGH (ref 6.7–22.6)

## 2021-11-15 SURGERY — IRRIGATION AND DEBRIDEMENT EXTREMITY
Anesthesia: General | Site: Arm Lower | Laterality: Bilateral

## 2021-11-15 MED ORDER — KETAMINE HCL 10 MG/ML IJ SOLN
INTRAMUSCULAR | Status: AC
  Filled 2021-11-15: qty 1

## 2021-11-15 MED ORDER — KETAMINE HCL 10 MG/ML IJ SOLN
INTRAMUSCULAR | Status: DC | PRN
  Administered 2021-11-15: 40 mg via INTRAVENOUS

## 2021-11-15 MED ORDER — THIAMINE HCL 100 MG/ML IJ SOLN
100.0000 mg | Freq: Every day | INTRAMUSCULAR | Status: DC
  Administered 2021-11-17 – 2021-11-18 (×2): 100 mg via INTRAVENOUS
  Filled 2021-11-15 (×4): qty 2

## 2021-11-15 MED ORDER — SUGAMMADEX SODIUM 200 MG/2ML IV SOLN
INTRAVENOUS | Status: DC | PRN
  Administered 2021-11-15: 200 mg via INTRAVENOUS

## 2021-11-15 MED ORDER — LORAZEPAM 1 MG PO TABS: 1.0000 mg | ORAL_TABLET | ORAL | Status: DC | PRN

## 2021-11-15 MED ORDER — TRAZODONE HCL 50 MG PO TABS
50.0000 mg | ORAL_TABLET | Freq: Every evening | ORAL | Status: DC | PRN
  Administered 2021-11-17 – 2021-11-18 (×2): 50 mg via ORAL
  Filled 2021-11-15 (×2): qty 1

## 2021-11-15 MED ORDER — PROPOFOL 500 MG/50ML IV EMUL
INTRAVENOUS | Status: DC | PRN
  Administered 2021-11-15: 200 mg via INTRAVENOUS

## 2021-11-15 MED ORDER — HYDRALAZINE HCL 20 MG/ML IJ SOLN
10.0000 mg | INTRAMUSCULAR | Status: DC | PRN
  Administered 2021-11-17 – 2021-11-20 (×2): 10 mg via INTRAVENOUS
  Filled 2021-11-15 (×3): qty 1

## 2021-11-15 MED ORDER — FENTANYL CITRATE PF 50 MCG/ML IJ SOSY: 25.0000 ug | PREFILLED_SYRINGE | INTRAMUSCULAR | Status: DC | PRN

## 2021-11-15 MED ORDER — CLINDAMYCIN PHOSPHATE 600 MG/50ML IV SOLN
600.0000 mg | Freq: Three times a day (TID) | INTRAVENOUS | Status: DC
Start: 2021-11-15 — End: 2021-11-15
  Administered 2021-11-15: 600 mg via INTRAVENOUS
  Filled 2021-11-15: qty 50

## 2021-11-15 MED ORDER — ACETAMINOPHEN 500 MG PO TABS: 1000.0000 mg | ORAL_TABLET | Freq: Once | ORAL | Status: DC

## 2021-11-15 MED ORDER — DEXAMETHASONE SODIUM PHOSPHATE 10 MG/ML IJ SOLN
INTRAMUSCULAR | Status: DC | PRN
Start: 2021-11-15 — End: 2021-11-15
  Administered 2021-11-15: 10 mg via INTRAVENOUS

## 2021-11-15 MED ORDER — IPRATROPIUM-ALBUTEROL 0.5-2.5 (3) MG/3ML IN SOLN: 3.0000 mL | RESPIRATORY_TRACT | Status: DC | PRN

## 2021-11-15 MED ORDER — ACETAMINOPHEN 325 MG PO TABS
650.0000 mg | ORAL_TABLET | Freq: Four times a day (QID) | ORAL | Status: DC
  Administered 2021-11-15 – 2021-11-19 (×12): 650 mg via ORAL
  Filled 2021-11-15 (×12): qty 2

## 2021-11-15 MED ORDER — SUCCINYLCHOLINE CHLORIDE 200 MG/10ML IV SOSY
PREFILLED_SYRINGE | INTRAVENOUS | Status: DC | PRN
  Administered 2021-11-15: 160 mg via INTRAVENOUS

## 2021-11-15 MED ORDER — FENTANYL CITRATE (PF) 100 MCG/2ML IJ SOLN
INTRAMUSCULAR | Status: DC | PRN
  Administered 2021-11-15: 100 ug via INTRAVENOUS
  Administered 2021-11-15 (×2): 50 ug via INTRAVENOUS

## 2021-11-15 MED ORDER — SENNOSIDES-DOCUSATE SODIUM 8.6-50 MG PO TABS: 1.0000 | ORAL_TABLET | Freq: Every evening | ORAL | Status: DC | PRN

## 2021-11-15 MED ORDER — ADULT MULTIVITAMIN W/MINERALS CH
1.0000 | ORAL_TABLET | Freq: Every day | ORAL | Status: DC
  Administered 2021-11-15 – 2021-11-25 (×9): 1 via ORAL
  Filled 2021-11-15 (×8): qty 1

## 2021-11-15 MED ORDER — ACETAMINOPHEN 650 MG RE SUPP
650.0000 mg | Freq: Four times a day (QID) | RECTAL | Status: DC
  Administered 2021-11-17: 650 mg via RECTAL
  Filled 2021-11-15 (×2): qty 1

## 2021-11-15 MED ORDER — OXYCODONE HCL 5 MG PO TABS
5.0000 mg | ORAL_TABLET | ORAL | Status: DC | PRN
  Administered 2021-11-15 – 2021-11-19 (×10): 5 mg via ORAL
  Filled 2021-11-15 (×12): qty 1

## 2021-11-15 MED ORDER — FENTANYL CITRATE (PF) 100 MCG/2ML IJ SOLN
INTRAMUSCULAR | Status: AC
  Filled 2021-11-15: qty 2

## 2021-11-15 MED ORDER — METOPROLOL TARTRATE 5 MG/5ML IV SOLN
5.0000 mg | INTRAVENOUS | Status: DC | PRN
  Administered 2021-11-15 – 2021-11-21 (×3): 5 mg via INTRAVENOUS
  Filled 2021-11-15 (×3): qty 5

## 2021-11-15 MED ORDER — PENICILLIN G POTASSIUM 20000000 UNITS IJ SOLR
12.0000 10*6.[IU] | Freq: Two times a day (BID) | INTRAMUSCULAR | Status: DC
  Administered 2021-11-15 – 2021-11-17 (×4): 12 10*6.[IU] via INTRAVENOUS
  Filled 2021-11-15 (×5): qty 12

## 2021-11-15 MED ORDER — HYDROMORPHONE HCL 1 MG/ML IJ SOLN
INTRAMUSCULAR | Status: AC
  Administered 2021-11-15: 0.5 mg via INTRAVENOUS
  Filled 2021-11-15: qty 1

## 2021-11-15 MED ORDER — CHLORHEXIDINE GLUCONATE 0.12 % MT SOLN
15.0000 mL | OROMUCOSAL | Status: AC
  Administered 2021-11-15: 15 mL via OROMUCOSAL

## 2021-11-15 MED ORDER — LACTATED RINGERS IV SOLN: INTRAVENOUS | Status: DC

## 2021-11-15 MED ORDER — ONDANSETRON HCL 4 MG/2ML IJ SOLN
INTRAMUSCULAR | Status: AC
  Filled 2021-11-15: qty 2

## 2021-11-15 MED ORDER — FOLIC ACID 1 MG PO TABS
1.0000 mg | ORAL_TABLET | Freq: Every day | ORAL | Status: DC
  Administered 2021-11-15 – 2021-11-25 (×9): 1 mg via ORAL
  Filled 2021-11-15 (×9): qty 1

## 2021-11-15 MED ORDER — PROPOFOL 10 MG/ML IV BOLUS
INTRAVENOUS | Status: AC
  Filled 2021-11-15: qty 20

## 2021-11-15 MED ORDER — ONDANSETRON HCL 4 MG/2ML IJ SOLN
INTRAMUSCULAR | Status: DC | PRN
  Administered 2021-11-15: 4 mg via INTRAVENOUS

## 2021-11-15 MED ORDER — MIDAZOLAM HCL 2 MG/2ML IJ SOLN
INTRAMUSCULAR | Status: AC
  Filled 2021-11-15: qty 2

## 2021-11-15 MED ORDER — ROCURONIUM BROMIDE 100 MG/10ML IV SOLN
INTRAVENOUS | Status: DC | PRN
  Administered 2021-11-15: 60 mg via INTRAVENOUS

## 2021-11-15 MED ORDER — MIDAZOLAM HCL 2 MG/2ML IJ SOLN
INTRAMUSCULAR | Status: DC | PRN
  Administered 2021-11-15: 2 mg via INTRAVENOUS

## 2021-11-15 MED ORDER — LORAZEPAM 2 MG/ML IJ SOLN
1.0000 mg | INTRAMUSCULAR | Status: DC | PRN
  Administered 2021-11-17: 1 mg via INTRAVENOUS
  Administered 2021-11-17 (×2): 4 mg via INTRAVENOUS
  Filled 2021-11-15: qty 2
  Filled 2021-11-15: qty 1
  Filled 2021-11-15: qty 2

## 2021-11-15 MED ORDER — SODIUM CHLORIDE 0.9 % IR SOLN
Status: DC | PRN
  Administered 2021-11-15: 3000 mL

## 2021-11-15 MED ORDER — HYDROMORPHONE HCL 1 MG/ML IJ SOLN: 0.2500 mg | INTRAMUSCULAR | Status: DC | PRN

## 2021-11-15 MED ORDER — THIAMINE HCL 100 MG PO TABS
100.0000 mg | ORAL_TABLET | Freq: Every day | ORAL | Status: DC
  Administered 2021-11-15 – 2021-11-25 (×8): 100 mg via ORAL
  Filled 2021-11-15 (×8): qty 1

## 2021-11-15 MED ORDER — DEXAMETHASONE SODIUM PHOSPHATE 10 MG/ML IJ SOLN
INTRAMUSCULAR | Status: AC
  Filled 2021-11-15: qty 1

## 2021-11-15 SURGICAL SUPPLY — 35 items
BAG COUNTER SPONGE SURGICOUNT (BAG) IMPLANT
BAG ZIPLOCK 12X15 (MISCELLANEOUS) ×2 IMPLANT
BNDG ELASTIC 3X5.8 VLCR STR LF (GAUZE/BANDAGES/DRESSINGS) ×1 IMPLANT
BNDG GAUZE ELAST 4 BULKY (GAUZE/BANDAGES/DRESSINGS) ×2 IMPLANT
CORD BIPOLAR FORCEPS 12FT (ELECTRODE) ×2 IMPLANT
COVER SURGICAL LIGHT HANDLE (MISCELLANEOUS) ×2 IMPLANT
CUFF TOURN SGL QUICK 18X4 (TOURNIQUET CUFF) ×2 IMPLANT
CUFF TOURN SGL QUICK 24 (TOURNIQUET CUFF)
CUFF TRNQT CYL 24X4X16.5-23 (TOURNIQUET CUFF) IMPLANT
DRAIN PENROSE 0.5X18 (DRAIN) IMPLANT
DRAPE SHEET LG 3/4 BI-LAMINATE (DRAPES) ×1 IMPLANT
DRAPE SURG 17X11 SM STRL (DRAPES) ×4 IMPLANT
ELECT REM PT RETURN 15FT ADLT (MISCELLANEOUS) ×2 IMPLANT
GAUZE SPONGE 4X4 12PLY STRL (GAUZE/BANDAGES/DRESSINGS) ×2 IMPLANT
GAUZE XEROFORM 5X9 LF (GAUZE/BANDAGES/DRESSINGS) ×1 IMPLANT
GLOVE SURG ENC TEXT LTX SZ8 (GLOVE) ×4 IMPLANT
GLOVE SURG UNDER POLY LF SZ6.5 (GLOVE) ×2 IMPLANT
GLOVE SURG UNDER POLY LF SZ7 (GLOVE) ×2 IMPLANT
GLOVE SURG UNDER POLY LF SZ7.5 (GLOVE) ×2 IMPLANT
HANDPIECE INTERPULSE COAX TIP (DISPOSABLE) ×2
KIT BASIN OR (CUSTOM PROCEDURE TRAY) ×2 IMPLANT
KIT TURNOVER KIT A (KITS) IMPLANT
NEEDLE HYPO 22GX1.5 SAFETY (NEEDLE) ×2 IMPLANT
NS IRRIG 1000ML POUR BTL (IV SOLUTION) ×2 IMPLANT
PACK ORTHO EXTREMITY (CUSTOM PROCEDURE TRAY) ×2 IMPLANT
PENCIL SMOKE EVACUATOR (MISCELLANEOUS) IMPLANT
PROTECTOR NERVE ULNAR (MISCELLANEOUS) ×2 IMPLANT
SET HNDPC FAN SPRY TIP SCT (DISPOSABLE) ×1 IMPLANT
STOCKINETTE 6  STRL (DRAPES) ×2
STOCKINETTE 6 STRL (DRAPES) ×1 IMPLANT
SUT ETHILON 3 0 PS 1 (SUTURE) ×2 IMPLANT
SUT ETHILON 4 0 PS 2 18 (SUTURE) ×2 IMPLANT
SWAB COLLECTION DEVICE MRSA (MISCELLANEOUS) ×2 IMPLANT
SWAB CULTURE ESWAB REG 1ML (MISCELLANEOUS) ×2 IMPLANT
SYR CONTROL 10ML LL (SYRINGE) ×2 IMPLANT

## 2021-11-15 NOTE — Op Note (Signed)
OPERATIVE NOTE  DATE OF PROCEDURE: 11/15/2021  SURGEONS:  Primary: Izell Brookdale, MD Assisting: Lenon Curt, MD  PREOPERATIVE DIAGNOSIS: Bilateral upper extremity forearm compartment syndrome, deep wrist infection, bilateral acute carpal tunnel syndrome  POSTOPERATIVE DIAGNOSIS: Same  NAME OF PROCEDURE:   Left wrist deep abscess I&D Left volar forearm fascitotomy Left wrist carpal tunnel release, extended with Guyon's Canal release  ANESTHESIA: General  SKIN PREPARATION: Hibiclens  ESTIMATED BLOOD LOSS: 20cc  IMPLANTS: none  INDICATIONS:  Jason Buchanan is a 35 y.o. male who has the above preoperative diagnosis. The patient has decided to proceed with surgical intervention.  Risks, benefits and alternatives of operative management were discussed including, but not limited to, risks of anesthesia complications, infection, pain, persistent symptoms, stiffness, need for future surgery.  The patient understands, agrees and elects to proceed with surgery.    DESCRIPTION OF PROCEDURE: The patient was met in the pre-operative area and their identity was verified.  The operative location and laterality was also verified and marked.  The patient was brought to the OR and was placed supine on the table.  After repeat patient identification with the operative team anesthesia was provided and the patient was prepped and draped in the usual sterile fashion.  A final timeout was performed verifying the correction patient, procedure, location and laterality.  The left upper extremity was elevated was elevated and tourniquet inflated.  A volar incision was made over the left hand at the level of the carpal tunnel.  Skin and subcutaneous tissues were divided as well as the palmar fascia and the transverse carpal ligament was identified.  This was incised completely.  Extended Alyse Low was made across the wrist and care to protect the palmar cutaneous nerve branch.  Guyon's canal was released separately via ulnar  dissection and complete release of the tight volar forearm fascia.  Both the median and ulnar neurovascular structures were protected throughout these releases.  At this time a curvilinear incision was made over the volar forearm of the left side due to compartment syndrome of the left forearm from infection the volar forearm fascia was released and each muscle belly was individually fasciotomized.   The wound was thoroughly irrigated with low flow cystoscopy tubing with normal saline. There is significant swelling to the left hand and a dorsal abscess was identified.  A longitudinal incision was made over the second metacarpal and the skin and subcutaneous tissues were divided.  Immediate purulence was identified and this was sent for culture.  All loculations were broken up of the left dorsal hand and wrist deep abscess.  A separate incision was made longitudinally over the dorsal forearm and there was immediate identification of the left forearm abscess dorsal and ulnar.  Wound was then thoroughly irrigated with normal saline via low flow cystoscopy tubing.  At the completion of the left upper extremity irrigation and debridement of the left deep abscess/carpal tunnel release/Guyon's canal release/volar forearm fasciotomy wound appeared clean with no remaining purulence.  It was again irrigated with normal saline.  The wound was loosely closed with a combination of 3-0 nylon interrupted sutures and staples for the forearm.  Dorsally the wounds were primarily left open with 1 stitch in each incision for loose approximation.   A sterile soft dressing was applied.  Patient tolerated the procedure well.  Please refer to the other op note for the right upper extremity in which I served as the assistant / Building control surveyor.    Matt Holmes, MD

## 2021-11-15 NOTE — Progress Notes (Signed)
PHARMACY - PHYSICIAN COMMUNICATION CRITICAL VALUE ALERT - BLOOD CULTURE IDENTIFICATION (BCID)  Jason Buchanan is an 35 y.o. male who presented to [facility] on [facility]/14/2021 with a chief complaint of RUE swelling & erythema.  Assessment:  35 yo M admitted with right hand fracture and subsequent cellulitis.  No abscess per imaging.  No purulence noted.   Known IVDA.   Initial blood cx + Strep pyogenes, no resistance detected.   2nd set blood cx remain negative but were drawn after initial abx dose given.  Patient is afebrile with elevated WBC.    Name of physician (or Provider) Contacted: Johann Capers, NP  Current antibiotics: Rocephin + Vancomycin  Changes to prescribed antibiotics recommended:  De-escalate antibiotics to Pen G 4 MU IV q4h + Clindamycin 600mg  IV q8h  Results for orders placed or performed during the hospital encounter of 11/14/21  Blood Culture ID Panel (Reflexed) (Collected: 11/14/2021  8:54 AM)  Result Value Ref Range   Enterococcus faecalis NOT DETECTED NOT DETECTED   Enterococcus Faecium NOT DETECTED NOT DETECTED   Listeria monocytogenes NOT DETECTED NOT DETECTED   Staphylococcus species NOT DETECTED NOT DETECTED   Staphylococcus aureus (BCID) NOT DETECTED NOT DETECTED   Staphylococcus epidermidis NOT DETECTED NOT DETECTED   Staphylococcus lugdunensis NOT DETECTED NOT DETECTED   Streptococcus species DETECTED (A) NOT DETECTED   Streptococcus agalactiae NOT DETECTED NOT DETECTED   Streptococcus pneumoniae NOT DETECTED NOT DETECTED   Streptococcus pyogenes DETECTED (A) NOT DETECTED   A.calcoaceticus-baumannii NOT DETECTED NOT DETECTED   Bacteroides fragilis NOT DETECTED NOT DETECTED   Enterobacterales NOT DETECTED NOT DETECTED   Enterobacter cloacae complex NOT DETECTED NOT DETECTED   Escherichia coli NOT DETECTED NOT DETECTED   Klebsiella aerogenes NOT DETECTED NOT DETECTED   Klebsiella oxytoca NOT DETECTED NOT DETECTED   Klebsiella pneumoniae NOT DETECTED NOT  DETECTED   Proteus species NOT DETECTED NOT DETECTED   Salmonella species NOT DETECTED NOT DETECTED   Serratia marcescens NOT DETECTED NOT DETECTED   Haemophilus influenzae NOT DETECTED NOT DETECTED   Neisseria meningitidis NOT DETECTED NOT DETECTED   Pseudomonas aeruginosa NOT DETECTED NOT DETECTED   Stenotrophomonas maltophilia NOT DETECTED NOT DETECTED   Candida albicans NOT DETECTED NOT DETECTED   Candida auris NOT DETECTED NOT DETECTED   Candida glabrata NOT DETECTED NOT DETECTED   Candida krusei NOT DETECTED NOT DETECTED   Candida parapsilosis NOT DETECTED NOT DETECTED   Candida tropicalis NOT DETECTED NOT DETECTED   Cryptococcus neoformans/gattii NOT DETECTED NOT DETECTED    14/11/2021 PharmD 11/15/2021  3:08 AM
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
## 2021-11-15 NOTE — Plan of Care (Signed)
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
## 2021-11-15 NOTE — TOC Progression Note (Signed)
Transition of Care [facility]) - Progression Note    Patient Details  Name: Jason Buchanan MRN: [id number] Date of Birth: June 07, 1986  [facility] CM/SW Contact  Geni Bers, RN Phone Number: 11/15/2021, 12:48 PM  Clinical Narrative:    Pt from Homeless/Shelter. At present time SNF will not take pt related to Polysubstance Abuse. TOC will continue to follow.    Expected Discharge Plan: Homeless Shelter Barriers to Discharge: No Barriers Identified  Expected Discharge Plan and Services Expected Discharge Plan: Homeless Shelter       Living arrangements for the past 2 months: Homeless Shelter Expected Discharge Date: 11/14/21                                     Social Determinants of Health (SDOH) Interventions    Readmission Risk Interventions No flowsheet data found.

## 2021-11-15 NOTE — Anesthesia Procedure Notes (Signed)
Procedure Name: Intubation Date/Time: 11/15/2021 5:17 PM Performed by: Gerald Leitz, CRNA Pre-anesthesia Checklist: Patient identified, Patient being monitored, Timeout performed, Emergency Drugs available and Suction available Patient Re-evaluated:Patient Re-evaluated prior to induction Oxygen Delivery Method: Circle system utilized Preoxygenation: Pre-oxygenation with 100% oxygen Induction Type: IV induction and Rapid sequence Ventilation: Mask ventilation without difficulty Laryngoscope Size: Mac and 3 Grade View: Grade I Tube type: Oral Tube size: 7.0 mm Number of attempts: 1 Airway Equipment and Method: Stylet Placement Confirmation: ETT inserted through vocal cords under direct vision, positive ETCO2 and breath sounds checked- equal and bilateral Secured at: 23 cm Tube secured with: Tape Dental Injury: Teeth and Oropharynx as per pre-operative assessment

## 2021-11-15 NOTE — Progress Notes (Signed)
Patient taken to OR per stretcher and RN.  No distress.

## 2021-11-15 NOTE — Consult Note (Signed)
[facility]       Reason for Consult:possible cellulitis    Referring Physician: Dr. Nelson Chimes  Principal Problem:   Sepsis (HCC)    acetaminophen  1,000 mg Oral Once   chlorhexidine  15 mL Mouth/Throat NOW   [MAR Hold] folic acid  1 mg Oral Daily   [MAR Hold] multivitamin with minerals  1 tablet Oral Daily   [MAR Hold] potassium chloride  40 mEq Oral BID   [MAR Hold] thiamine  100 mg Oral Daily   Or   [MAR Hold] thiamine  100 mg Intravenous Daily    Recommendations: Continue penicillin Will stop clindamycin    Assessment: He has bullous lesions of his right arm and left hand with minimal warmth and minimal erythema.  Differential includes cellulitis/erysipelas vs bullous skin disease such as bullous pemphigoid.  Will continue with penicillin. No necrotizing process noted on CT scan so will stop clindamycin.   Anemia and leukocytosis - varied from the previous day.  ? If not accurate.  Will monitor again tomorrow.   Antibiotics: Clindamycin and penicillin  HPI: Jason Buchanan is a 35 y.o. male with a history of drug use and recently with multiple injections into his arms who came in with swelling and redness of his right arm and left hand.  He has been afebrile but WBC 37.4.  amphetamines on his drug screen. 1/4 bottles of blood cultures with Strep pyogenes.  CT scan done of his arms and no necrotizing process noted.  He does not give me any history.   History largely from chart, patient did not answer most questions  Review of Systems:  Constitutional: negative for fevers and chills Gastrointestinal: negative for diarrhea All other systems reviewed and are negative    Past Medical History:  Diagnosis Date   Asthma    Mood disorder (HCC)    Polysubstance abuse (HCC)     Social History   Tobacco Use   Smoking status: Every Day    Packs/day: 0.50    Types: Cigarettes   Smokeless tobacco: Never  Substance Use Topics   Alcohol use: Not  Currently    Comment: occasionally   Drug use: Not Currently    Types: IV    Comment: heroin, meth    FMH: patient did not answer  Allergies  Allergen Reactions   Bee Venom Anaphylaxis and Swelling    Swelling all over    Physical Exam: Constitutional: in no apparent distress  Vitals:   11/15/21 1342 11/15/21 1608  BP: (!) 127/53 120/60  Pulse: (!) 116   Resp: 18   Temp: 98.3 F (36.8 C)   SpO2: 97%    EYES: anicteric Respiratory: normal respiratory effort GI: soft Musculoskeletal: right arm with multiple bullous lesions, mild warmth, minimal erythema, + swelling of arm and hand Neuro: non-focal  Lab Results  Component Value Date   WBC 37.4 (H) 11/15/2021   HGB 9.7 (L) 11/15/2021   HCT 28.0 (L) 11/15/2021   MCV 82.8 11/15/2021   PLT 313 11/15/2021    Lab Results  Component Value Date   CREATININE 0.87 11/15/2021   BUN 48 (H) 11/15/2021   NA 131 (L) 11/15/2021   K 3.5 11/15/2021   CL 98 11/15/2021   CO2 23 11/15/2021    Lab Results  Component Value Date   ALT 20 11/15/2021   AST 37 11/15/2021   ALKPHOS 96 11/15/2021     Microbiology: Recent Results (from the past  240 hour(s))  Resp Panel by RT-PCR (Flu A&B, Covid) Nasopharyngeal Swab     Status: None   Collection Time: 11/14/21  8:54 AM   Specimen: Nasopharyngeal Swab; Nasopharyngeal(NP) swabs in vial transport medium  Result Value Ref Range Status   SARS Coronavirus 2 by RT PCR NEGATIVE NEGATIVE Final    Comment: (NOTE) SARS-CoV-2 target nucleic acids are NOT DETECTED.  The SARS-CoV-2 RNA is generally detectable in upper respiratory specimens during the acute phase of infection. The lowest concentration of SARS-CoV-2 viral copies this assay can detect is 138 copies/mL. A negative result does not preclude SARS-Cov-2 infection and should not be used as the sole basis for treatment or other patient management decisions. A negative result may occur with  improper specimen collection/handling,  submission of specimen other than nasopharyngeal swab, presence of viral mutation(s) within the areas targeted by this assay, and inadequate number of viral copies(<138 copies/mL). A negative result must be combined with clinical observations, patient history, and epidemiological information. The expected result is Negative.  Fact Sheet for Patients:  BloggerCourse.com  Fact Sheet for Healthcare Providers:  SeriousBroker.it  This test is no t yet approved or cleared by the [place] FDA and  has been authorized for detection and/or diagnosis of SARS-CoV-2 by FDA under an Emergency Use Authorization (EUA). This EUA will remain  in effect (meaning this test can be used) for the duration of the COVID-19 declaration under Section 564(b)(1) of the Act, 21 U.S.C.section 360bbb-3(b)(1), unless the authorization is terminated  or revoked sooner.       Influenza A by PCR NEGATIVE NEGATIVE Final   Influenza B by PCR NEGATIVE NEGATIVE Final    Comment: (NOTE) The Xpert Xpress SARS-CoV-2/FLU/RSV plus assay is intended as an aid in the diagnosis of influenza from Nasopharyngeal swab specimens and should not be used as a sole basis for treatment. Nasal washings and aspirates are unacceptable for Xpert Xpress SARS-CoV-2/FLU/RSV testing.  Fact Sheet for Patients: BloggerCourse.com  Fact Sheet for Healthcare Providers: SeriousBroker.it  This test is not yet approved or cleared by the [place] FDA and has been authorized for detection and/or diagnosis of SARS-CoV-2 by FDA under an Emergency Use Authorization (EUA). This EUA will remain in effect (meaning this test can be used) for the duration of the COVID-19 declaration under Section 564(b)(1) of the Act, 21 U.S.C. section 360bbb-3(b)(1), unless the authorization is terminated or revoked.  Performed at [facility], [address], [place], [place] [address]   Blood Culture (routine x 2)     Status: None (Preliminary result)   Collection Time: 11/14/21  8:54 AM   Specimen: BLOOD  Result Value Ref Range Status   Specimen Description   Final    BLOOD LEFT ANTECUBITAL Performed at [facility], [address] [place], [place] [address]    Special Requests   Final    BOTTLES DRAWN AEROBIC AND ANAEROBIC Blood Culture results may not be optimal due to an excessive volume of blood received in culture bottles Performed at [facility], [address] [place], [place] [address]    Culture  Setup Time   Final    GRAM POSITIVE COCCI IN CHAINS ANAEROBIC BOTTLE ONLY CRITICAL RESULT CALLED TO, READ BACK BY AND VERIFIED WITH: M LILLISTON,PHARMD@0300  11/15/21 MK    Culture   Final    GRAM POSITIVE COCCI TOO YOUNG TO READ Performed at [facility], [address] [place], [place] [address]  Report Status PENDING  Incomplete  Blood Culture ID Panel (Reflexed)     Status: Abnormal   Collection Time: 11/14/21  8:54 AM  Result Value Ref Range Status   Enterococcus faecalis NOT DETECTED NOT DETECTED Final   Enterococcus Faecium NOT DETECTED NOT DETECTED Final   Listeria monocytogenes NOT DETECTED NOT DETECTED Final   Staphylococcus species NOT DETECTED NOT DETECTED Final   Staphylococcus aureus (BCID) NOT DETECTED NOT DETECTED Final   Staphylococcus epidermidis NOT DETECTED NOT DETECTED Final   Staphylococcus lugdunensis NOT DETECTED NOT DETECTED Final   Streptococcus species DETECTED (A) NOT DETECTED Final    Comment: CRITICAL RESULT CALLED TO, READ BACK BY AND VERIFIED WITH: M LILLISTON,PHARMD@0300  11/15/21 MK    Streptococcus agalactiae NOT DETECTED NOT DETECTED Final   Streptococcus pneumoniae NOT DETECTED NOT DETECTED Final   Streptococcus pyogenes DETECTED (A) NOT DETECTED Final    Comment: CRITICAL RESULT CALLED TO, READ BACK BY AND VERIFIED  WITH: M LILLISTON,PHARMD@0300  11/15/21 MK    A.calcoaceticus-baumannii NOT DETECTED NOT DETECTED Final   Bacteroides fragilis NOT DETECTED NOT DETECTED Final   Enterobacterales NOT DETECTED NOT DETECTED Final   Enterobacter cloacae complex NOT DETECTED NOT DETECTED Final   Escherichia coli NOT DETECTED NOT DETECTED Final   Klebsiella aerogenes NOT DETECTED NOT DETECTED Final   Klebsiella oxytoca NOT DETECTED NOT DETECTED Final   Klebsiella pneumoniae NOT DETECTED NOT DETECTED Final   Proteus species NOT DETECTED NOT DETECTED Final   Salmonella species NOT DETECTED NOT DETECTED Final   Serratia marcescens NOT DETECTED NOT DETECTED Final   Haemophilus influenzae NOT DETECTED NOT DETECTED Final   Neisseria meningitidis NOT DETECTED NOT DETECTED Final   Pseudomonas aeruginosa NOT DETECTED NOT DETECTED Final   Stenotrophomonas maltophilia NOT DETECTED NOT DETECTED Final   Candida albicans NOT DETECTED NOT DETECTED Final   Candida auris NOT DETECTED NOT DETECTED Final   Candida glabrata NOT DETECTED NOT DETECTED Final   Candida krusei NOT DETECTED NOT DETECTED Final   Candida parapsilosis NOT DETECTED NOT DETECTED Final   Candida tropicalis NOT DETECTED NOT DETECTED Final   Cryptococcus neoformans/gattii NOT DETECTED NOT DETECTED Final    Comment: Performed at [facility], [address], [place], [place] [address]  Blood Culture (routine x 2)     Status: None (Preliminary result)   Collection Time: 11/14/21  9:54 AM   Specimen: BLOOD  Result Value Ref Range Status   Specimen Description   Final    BLOOD LEFT ARM Performed at [facility], [address] [place], [place] [address]    Special Requests   Final    BOTTLES DRAWN AEROBIC AND ANAEROBIC Blood Culture adequate volume Performed at [facility], [address], [place], [place] [address]    Culture   Final    NO GROWTH < 24 HOURS Performed at [facility], [address], [place], [place] [address]    Report Status PENDING  Incomplete    Gardiner Barefoot, MD [facility] for Infectious Disease [facility] [email or web address] 11/15/2021, 4:12 PM

## 2021-11-15 NOTE — Anesthesia Postprocedure Evaluation (Signed)
Anesthesia Post Note  Patient: Jason Buchanan  Procedure(s) Performed: IRRIGATION AND DEBRIDEMENT EXTREMITY (Bilateral: Arm Lower)     Patient location during evaluation: PACU Anesthesia Type: General Level of consciousness: awake Pain management: pain level controlled Vital Signs Assessment: post-procedure vital signs reviewed and stable Respiratory status: spontaneous breathing and respiratory function stable Cardiovascular status: stable Postop Assessment: no apparent nausea or vomiting Anesthetic complications: no   No notable events documented.  Last Vitals:  Vitals:   11/15/21 1930 11/15/21 1945  BP: (!) 141/78 128/62  Pulse: (!) 111 (!) 110  Resp: 20 14  Temp:    SpO2: 97% 98%    Last Pain:  Vitals:   11/15/21 1930  TempSrc:   PainSc: 10-Worst pain ever                 Mellody Dance

## 2021-11-15 NOTE — Anesthesia Preprocedure Evaluation (Addendum)
Anesthesia Evaluation  Patient identified by MRN, date of birth, ID band Patient awake    Reviewed: Allergy & Precautions, Patient's Chart, lab work & pertinent test results  Airway Mallampati: III  TM Distance: >3 FB Neck ROM: Full  Mouth opening: Limited Mouth Opening  Dental  (+) Poor Dentition   Pulmonary asthma , Current Smoker and Patient abstained from smoking.,    Pulmonary exam normal breath sounds clear to auscultation       Cardiovascular Exercise Tolerance: Good negative cardio ROS   Rhythm:Regular Rate:Tachycardia  TEE 2022 1. Left ventricular ejection fraction, by estimation, is 60 to 65%. The  left ventricle has normal function. The left ventricle has no regional  wall motion abnormalities.  2. Right ventricular systolic function is normal. The right ventricular  size is normal.  3. No left atrial/left atrial appendage thrombus was detected.  4. The mitral valve is normal in structure. Trivial mitral valve  regurgitation.  5. The aortic valve is bicuspid. Aortic valve regurgitation is trivial.    Neuro/Psych negative neurological ROS  negative psych ROS   GI/Hepatic negative GI ROS, (+)     substance abuse  cocaine use, methamphetamine use and IV drug use,   Endo/Other  negative endocrine ROS  Renal/GU negative Renal ROS  negative genitourinary   Musculoskeletal negative musculoskeletal ROS (+)   Abdominal   Peds  Hematology negative hematology ROS (+) anemia ,   Anesthesia Other Findings Cellulitis of BUE (right worse than left)  Reproductive/Obstetrics                          Anesthesia Physical Anesthesia Plan  ASA: 3 and emergent  Anesthesia Plan: General   Post-op Pain Management:    Induction: Intravenous  PONV Risk Score and Plan: 1 and Ondansetron and Midazolam  Airway Management Planned: Oral ETT  Additional Equipment:   Intra-op Plan:    Post-operative Plan: Extubation in OR and Possible Post-op intubation/ventilation  Informed Consent: I have reviewed the patients History and Physical, chart, labs and discussed the procedure including the risks, benefits and alternatives for the proposed anesthesia with the patient or authorized representative who has indicated his/her understanding and acceptance.     Dental advisory given  Plan Discussed with: CRNA, Anesthesiologist and Surgeon  Anesthesia Plan Comments: (2 PIVs in left upper extremity that run well and are out of the operative site. All extremities including feet with some level of infection. + blood cultures so will avoid central access for now unless needed emergently. Discussed the patient with his hospitalist Dr. Stephania Fragmin and he agrees,  With positive blood cultures plan is to get more days of antibiotics in him. Discussed the possible need to remain intubated, but will plan to extubate for now. Dr. Nelson Chimes will notify ICU to make them aware of the patient just in case. Tanna Furry, MD  )    Anesthesia Quick Evaluation

## 2021-11-15 NOTE — Transfer of Care (Signed)
Immediate Anesthesia Transfer of Care Note  Patient: Jason Buchanan  Procedure(s) Performed: IRRIGATION AND DEBRIDEMENT EXTREMITY (Bilateral: Arm Lower)  Patient Location: PACU  Anesthesia Type:General  Level of Consciousness: sedated, patient cooperative and responds to stimulation  Airway & Oxygen Therapy: Patient Spontanous Breathing and Patient connected to face mask oxygen  Post-op Assessment: Report given to RN and Post -op Vital signs reviewed and stable  Post vital signs: Reviewed and stable  Last Vitals:  Vitals Value Taken Time  BP 137/80 11/15/21 1916  Temp    Pulse 117 11/15/21 1917  Resp 24 11/15/21 1917  SpO2 100 % 11/15/21 1917  Vitals shown include unvalidated device data.  Last Pain:  Vitals:   11/15/21 1608  TempSrc:   PainSc: 10-Worst pain ever      Patients Stated Pain Goal: 1 (11/15/21 0800)  Complications: No notable events documented.

## 2021-11-16 ENCOUNTER — Encounter (HOSPITAL_COMMUNITY): Payer: Self-pay | Admitting: General Surgery

## 2021-11-16 ENCOUNTER — Inpatient Hospital Stay (HOSPITAL_COMMUNITY): Payer: 59

## 2021-11-16 DIAGNOSIS — L03113 Cellulitis of right upper limb: Secondary | ICD-10-CM

## 2021-11-16 DIAGNOSIS — B955 Unspecified streptococcus as the cause of diseases classified elsewhere: Secondary | ICD-10-CM

## 2021-11-16 DIAGNOSIS — R7881 Bacteremia: Secondary | ICD-10-CM

## 2021-11-16 LAB — HEMOGLOBIN AND HEMATOCRIT, BLOOD
HCT: 19.1 % — ABNORMAL LOW (ref 39.0–52.0)
Hemoglobin: 6.2 g/dL — CL (ref 13.0–17.0)

## 2021-11-16 LAB — CBC
HCT: 25.4 % — ABNORMAL LOW (ref 39.0–52.0)
Hemoglobin: 8.1 g/dL — ABNORMAL LOW (ref 13.0–17.0)
MCH: 28.2 pg (ref 26.0–34.0)
MCHC: 31.9 g/dL (ref 30.0–36.0)
MCV: 88.5 fL (ref 80.0–100.0)
Platelets: 314 10*3/uL (ref 150–400)
RBC: 2.87 MIL/uL — ABNORMAL LOW (ref 4.22–5.81)
RDW: 14.9 % (ref 11.5–15.5)
WBC: 39.1 10*3/uL — ABNORMAL HIGH (ref 4.0–10.5)
nRBC: 0.2 % (ref 0.0–0.2)

## 2021-11-16 LAB — BASIC METABOLIC PANEL
Anion gap: 9 (ref 5–15)
BUN: 40 mg/dL — ABNORMAL HIGH (ref 6–20)
CO2: 24 mmol/L (ref 22–32)
Calcium: 7.6 mg/dL — ABNORMAL LOW (ref 8.9–10.3)
Chloride: 100 mmol/L (ref 98–111)
Creatinine, Ser: 0.62 mg/dL (ref 0.61–1.24)
GFR, Estimated: >60 mL/min (ref >60)
Glucose, Bld: 126 mg/dL — ABNORMAL HIGH (ref 70–99)
Potassium: 4.4 mmol/L (ref 3.5–5.1)
Sodium: 133 mmol/L — ABNORMAL LOW (ref 135–145)

## 2021-11-16 LAB — ECHOCARDIOGRAM COMPLETE
Area-P 1/2: 4.89 cm2
Height: 67 in
P 1/2 time: 200 msec
S' Lateral: 3.2 cm
Weight: 2116.42 oz

## 2021-11-16 LAB — MAGNESIUM: Magnesium: 1.6 mg/dL — ABNORMAL LOW (ref 1.7–2.4)

## 2021-11-16 LAB — PREPARE RBC (CROSSMATCH)

## 2021-11-16 MED ORDER — PANTOPRAZOLE SODIUM 40 MG IV SOLR
40.0000 mg | Freq: Two times a day (BID) | INTRAVENOUS | Status: DC
  Administered 2021-11-16: 21:00:00 40 mg via INTRAVENOUS
  Filled 2021-11-16: qty 40

## 2021-11-16 MED ORDER — SODIUM CHLORIDE 0.9% IV SOLUTION: Freq: Once | INTRAVENOUS | Status: AC

## 2021-11-16 MED ORDER — MAGNESIUM OXIDE -MG SUPPLEMENT 400 (240 MG) MG PO TABS
800.0000 mg | ORAL_TABLET | Freq: Two times a day (BID) | ORAL | Status: DC
  Administered 2021-11-16 – 2021-11-17 (×3): 800 mg via ORAL
  Filled 2021-11-16 (×3): qty 2

## 2021-11-16 NOTE — Plan of Care (Signed)
°  Problem: Education: Goal: Knowledge of General Education information will improve Description: Including pain rating scale, medication(s)/side effects and non-pharmacologic comfort measures Outcome: Progressing   Problem: Clinical Measurements: Goal: Will remain free from infection Outcome: Not Progressing Goal: Respiratory complications will improve Outcome: Progressing

## 2021-11-16 NOTE — Progress Notes (Signed)
Echocardiogram 2D Echocardiogram has been performed.  Warren Lacy Karalyne Nusser RDCS 11/16/2021, 2:00 PM

## 2021-11-16 NOTE — Progress Notes (Signed)
[facility]  Date of Admission:  11/14/2021     Total days of antibiotics 3         ASSESSMENT:  Jason Buchanan is POD#1 from I&D of bilateral upper extremities in the setting of Group A streptococcus bacteremia. Surgical specimens are growing gram positive cocci with cultures pending. Continue current dose of Penicillin while awaiting surgical plans for any further intervention. Repeat blood cultures have been without growth to date and will continue to monitor. Pain management and remaining medical care per primary team.   PLAN:  Continue penicillin. Post-operative wound are per Dr. Izora Ribas. Monitor repeat cultures for clearance of bacteremia. Remaining medical and supportive care per primary team.   Principal Problem:   Sepsis (HCC) Active Problems:   Streptococcal bacteremia    acetaminophen  650 mg Oral QID   Or   acetaminophen  650 mg Rectal QID   folic acid  1 mg Oral Daily   magnesium oxide  800 mg Oral BID   multivitamin with minerals  1 tablet Oral Daily   thiamine  100 mg Oral Daily   Or   thiamine  100 mg Intravenous Daily    SUBJECTIVE:  Afebrile overnight. Not feeling well this morning. Nursing reports he had a bloody bowel movement.   Allergies  Allergen Reactions   Bee Venom Anaphylaxis and Swelling    Swelling all over     Review of Systems: Review of Systems  Constitutional:  Negative for chills, fever and weight loss.  Respiratory:  Negative for cough, shortness of breath and wheezing.   Cardiovascular:  Negative for chest pain and leg swelling.  Gastrointestinal:  Negative for abdominal pain, constipation, diarrhea, nausea and vomiting.  Skin:  Negative for rash.     OBJECTIVE: Vitals:   11/15/21 2000 11/15/21 2015 11/15/21 2030 11/16/21 0501  BP: (!) 153/94 (!) 147/68 (!) 147/68 (!) 173/80  Pulse: (!) 109  (!) 107 91  Resp: 12  16 16   Temp: 98.9 F (37.2 C)   98.9 F (37.2 C)  TempSrc:    Oral  SpO2: 100%   99% 100%  Weight:      Height:       Body mass index is 20.72 kg/m.  Physical Exam Constitutional:      General: He is not in acute distress.    Appearance: He is well-developed.  Cardiovascular:     Rate and Rhythm: Normal rate and regular rhythm.     Heart sounds: Normal heart sounds.  Pulmonary:     Effort: Pulmonary effort is normal.     Breath sounds: Normal breath sounds.  Musculoskeletal:     Comments: Surgical dressing in place bilateral upper lower extremity.   Skin:    General: Skin is warm and dry.  Neurological:     Mental Status: He is alert and oriented to person, place, and time.  Psychiatric:        Mood and Affect: Mood normal.    Lab Results Lab Results  Component Value Date   WBC 39.1 (H) 11/16/2021   HGB 8.1 (L) 11/16/2021   HCT 25.4 (L) 11/16/2021   MCV 88.5 11/16/2021   PLT 314 11/16/2021    Lab Results  Component Value Date   CREATININE 0.62 11/16/2021   BUN 40 (H) 11/16/2021   NA 133 (L) 11/16/2021   K 4.4 11/16/2021   CL 100 11/16/2021   CO2 24 11/16/2021    Lab Results  Component  Value Date   ALT 20 11/15/2021   AST 37 11/15/2021   ALKPHOS 96 11/15/2021   BILITOT 1.4 (H) 11/15/2021     Microbiology: Recent Results (from the past 240 hour(s))  Resp Panel by RT-PCR (Flu A&B, Covid) Nasopharyngeal Swab     Status: None   Collection Time: 11/14/21  8:54 AM   Specimen: Nasopharyngeal Swab; Nasopharyngeal(NP) swabs in vial transport medium  Result Value Ref Range Status   SARS Coronavirus 2 by RT PCR NEGATIVE NEGATIVE Final    Comment: (NOTE) SARS-CoV-2 target nucleic acids are NOT DETECTED.  The SARS-CoV-2 RNA is generally detectable in upper respiratory specimens during the acute phase of infection. The lowest concentration of SARS-CoV-2 viral copies this assay can detect is 138 copies/mL. A negative result does not preclude SARS-Cov-2 infection and should not be used as the sole basis for treatment or other patient management  decisions. A negative result may occur with  improper specimen collection/handling, submission of specimen other than nasopharyngeal swab, presence of viral mutation(s) within the areas targeted by this assay, and inadequate number of viral copies(<138 copies/mL). A negative result must be combined with clinical observations, patient history, and epidemiological information. The expected result is Negative.  Fact Sheet for Patients:  BloggerCourse.com  Fact Sheet for Healthcare Providers:  SeriousBroker.it  This test is no t yet approved or cleared by the [place] FDA and  has been authorized for detection and/or diagnosis of SARS-CoV-2 by FDA under an Emergency Use Authorization (EUA). This EUA will remain  in effect (meaning this test can be used) for the duration of the COVID-19 declaration under Section 564(b)(1) of the Act, 21 U.S.C.section 360bbb-3(b)(1), unless the authorization is terminated  or revoked sooner.       Influenza A by PCR NEGATIVE NEGATIVE Final   Influenza B by PCR NEGATIVE NEGATIVE Final    Comment: (NOTE) The Xpert Xpress SARS-CoV-2/FLU/RSV plus assay is intended as an aid in the diagnosis of influenza from Nasopharyngeal swab specimens and should not be used as a sole basis for treatment. Nasal washings and aspirates are unacceptable for Xpert Xpress SARS-CoV-2/FLU/RSV testing.  Fact Sheet for Patients: BloggerCourse.com  Fact Sheet for Healthcare Providers: SeriousBroker.it  This test is not yet approved or cleared by the [place] FDA and has been authorized for detection and/or diagnosis of SARS-CoV-2 by FDA under an Emergency Use Authorization (EUA). This EUA will remain in effect (meaning this test can be used) for the duration of the COVID-19 declaration under Section 564(b)(1) of the Act, 21 U.S.C. section 360bbb-3(b)(1), unless the  authorization is terminated or revoked.  Performed at [facility], [address] [place], [place] [address]   Blood Culture (routine x 2)     Status: Abnormal (Preliminary result)   Collection Time: 11/14/21  8:54 AM   Specimen: BLOOD  Result Value Ref Range Status   Specimen Description   Final    BLOOD LEFT ANTECUBITAL Performed at [facility], [address], [place], [place] [address]    Special Requests   Final    BOTTLES DRAWN AEROBIC AND ANAEROBIC Blood Culture results may not be optimal due to an excessive volume of blood received in culture bottles Performed at [facility], [address] [place], [place] [address]    Culture  Setup Time   Final    GRAM POSITIVE COCCI IN CHAINS ANAEROBIC BOTTLE ONLY CRITICAL RESULT CALLED TO, READ BACK BY AND VERIFIED WITH: M LILLISTON,PHARMD@0300   11/15/21 MK    Culture (A)  Final    GROUP A STREP (S.PYOGENES) ISOLATED SUSCEPTIBILITIES TO FOLLOW Performed at [facility], [address] [place], [place] [address]    Report Status PENDING  Incomplete  Blood Culture ID Panel (Reflexed)     Status: Abnormal   Collection Time: 11/14/21  8:54 AM  Result Value Ref Range Status   Enterococcus faecalis NOT DETECTED NOT DETECTED Final   Enterococcus Faecium NOT DETECTED NOT DETECTED Final   Listeria monocytogenes NOT DETECTED NOT DETECTED Final   Staphylococcus species NOT DETECTED NOT DETECTED Final   Staphylococcus aureus (BCID) NOT DETECTED NOT DETECTED Final   Staphylococcus epidermidis NOT DETECTED NOT DETECTED Final   Staphylococcus lugdunensis NOT DETECTED NOT DETECTED Final   Streptococcus species DETECTED (A) NOT DETECTED Final    Comment: CRITICAL RESULT CALLED TO, READ BACK BY AND VERIFIED WITH: M LILLISTON,PHARMD@0300  11/15/21 MK    Streptococcus agalactiae NOT DETECTED NOT DETECTED Final   Streptococcus pneumoniae NOT DETECTED NOT DETECTED Final    Streptococcus pyogenes DETECTED (A) NOT DETECTED Final    Comment: CRITICAL RESULT CALLED TO, READ BACK BY AND VERIFIED WITH: M LILLISTON,PHARMD@0300  11/15/21 MK    A.calcoaceticus-baumannii NOT DETECTED NOT DETECTED Final   Bacteroides fragilis NOT DETECTED NOT DETECTED Final   Enterobacterales NOT DETECTED NOT DETECTED Final   Enterobacter cloacae complex NOT DETECTED NOT DETECTED Final   Escherichia coli NOT DETECTED NOT DETECTED Final   Klebsiella aerogenes NOT DETECTED NOT DETECTED Final   Klebsiella oxytoca NOT DETECTED NOT DETECTED Final   Klebsiella pneumoniae NOT DETECTED NOT DETECTED Final   Proteus species NOT DETECTED NOT DETECTED Final   Salmonella species NOT DETECTED NOT DETECTED Final   Serratia marcescens NOT DETECTED NOT DETECTED Final   Haemophilus influenzae NOT DETECTED NOT DETECTED Final   Neisseria meningitidis NOT DETECTED NOT DETECTED Final   Pseudomonas aeruginosa NOT DETECTED NOT DETECTED Final   Stenotrophomonas maltophilia NOT DETECTED NOT DETECTED Final   Candida albicans NOT DETECTED NOT DETECTED Final   Candida auris NOT DETECTED NOT DETECTED Final   Candida glabrata NOT DETECTED NOT DETECTED Final   Candida krusei NOT DETECTED NOT DETECTED Final   Candida parapsilosis NOT DETECTED NOT DETECTED Final   Candida tropicalis NOT DETECTED NOT DETECTED Final   Cryptococcus neoformans/gattii NOT DETECTED NOT DETECTED Final    Comment: Performed at [facility], [address], [place], [place] [address]  Blood Culture (routine x 2)     Status: None (Preliminary result)   Collection Time: 11/14/21  9:54 AM   Specimen: BLOOD  Result Value Ref Range Status   Specimen Description   Final    BLOOD LEFT ARM Performed at [facility], [address] [place], [place] [address]    Special Requests   Final    BOTTLES DRAWN AEROBIC AND ANAEROBIC Blood Culture adequate volume Performed at [facility], [address], [place], [place] [address]    Culture   Final    NO GROWTH 2 DAYS Performed at [facility], [address], [place], [place] [address]    Report Status PENDING  Incomplete  Aerobic/Anaerobic Culture w Gram Stain (surgical/deep wound)     Status: None (Preliminary result)   Collection Time: 11/15/21  5:46 PM   Specimen: PATH Other; Tissue  Result Value Ref Range Status   Specimen Description   Final    WRIST RIGHT Performed at [facility], [address], [place], [place] [address]    Special Requests   Final    NONE Performed at [facility], [address], [place], [place] [address]    Gram Stain   Final    FEW WBC PRESENT, PREDOMINANTLY MONONUCLEAR FEW GRAM POSITIVE COCCI IN PAIRS AND CHAINS    Culture   Final    TOO YOUNG TO READ Performed at [facility], [address], [place], [place] [address]    Report Status PENDING  Incomplete  Aerobic/Anaerobic Culture w Gram Stain (surgical/deep wound)     Status: None (Preliminary result)   Collection Time: 11/15/21  6:35 PM   Specimen: PATH Other; Tissue  Result Value Ref Range Status   Specimen Description   Final    HAND LEFT DORSAL Performed at [facility], [address], [place], [place] [address]    Special Requests   Final    NONE Performed at [facility], [address] [place], [place] [address]    Gram Stain   Final    MODERATE WBC PRESENT, PREDOMINANTLY MONONUCLEAR ABUNDANT GRAM POSITIVE COCCI    Culture   Final    NO GROWTH < 12 HOURS Performed at [facility], [address], [place], [place] [address]    Report Status PENDING  Incomplete  Culture, blood (Routine X 2) w Reflex to ID Panel     Status: None (Preliminary result)   Collection Time: 11/16/21  4:51 AM   Specimen: BLOOD  Result Value Ref Range Status   Specimen Description   Final    BLOOD RIGHT FOOT Performed at [facility], [address] [place], [place] [address]    Special Requests   Final    BOTTLES DRAWN AEROBIC ONLY Blood Culture adequate volume Performed at [facility], [address] [place], [place] [address]    Culture   Final    NO GROWTH < 12 HOURS Performed at [facility], [address], [place], [place] [address]    Report Status PENDING  Incomplete  Culture, blood (Routine X 2) w Reflex to ID Panel     Status: None (Preliminary result)   Collection Time: 11/16/21  4:51 AM   Specimen: BLOOD  Result Value Ref Range Status   Specimen Description   Final    BLOOD RIGHT ANKLE Performed at [facility], [address], [place], [place] [address]    Special Requests   Final    BOTTLES DRAWN AEROBIC ONLY Blood Culture adequate volume Performed at [facility], [address], [place], [place] [address]    Culture   Final    NO GROWTH < 12 HOURS Performed at [facility], [address], [place], [place] [address]    Report Status PENDING  Incomplete     Marcos Eke, NP [facility]  11/16/2021  11:30 AM
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
## 2021-11-16 NOTE — Consult Note (Addendum)
Reason for Consult: 2 days of maroon bowel movements x1 each day Referring Physician: Hospital team  Jason Buchanan is an 35 y.o. male.  HPI: Patient seen and examined and case discussed with the hospital team and the nurse as well and he complains of pain everywhere but is too sedated to get an accurate history but says he had no previous GI issues and no history of GI bleeding and he says he has no family but can answer very many questions except for when we discussed he was on too much pain medicines he did not agree with that  Past Medical History:  Diagnosis Date   Asthma    Mood disorder (HCC)    Polysubstance abuse (HCC)     Past Surgical History:  Procedure Laterality Date   ANKLE SURGERY     BUBBLE STUDY  09/30/2021   Procedure: BUBBLE STUDY;  Surgeon: Orpah Cobb, MD;  Location: [facility];  Service: Cardiovascular;;   FRACTURE SURGERY     I & D EXTREMITY Right 09/27/2021   Procedure: IRRIGATION AND DEBRIDEMENT OF ABSCESS RIGHT HAND;  Surgeon: Knute Neu, MD;  Location: [facility] OR;  Service: Plastics;  Laterality: Right;   I & D EXTREMITY Bilateral 11/15/2021   Procedure: IRRIGATION AND DEBRIDEMENT EXTREMITY;  Surgeon: Knute Neu, MD;  Location: [facility] ORS;  Service: Plastics;  Laterality: Bilateral;   INCISION AND DRAINAGE OF WOUND Left 09/27/2021   Procedure: IRRIGATION AND DEBRIDEMENT OF ABSCESS LEFT INDEX FINGER;  Surgeon: Knute Neu, MD;  Location: [facility] OR;  Service: Plastics;  Laterality: Left;   JOINT REPLACEMENT     TEE WITHOUT CARDIOVERSION N/A 09/30/2021   Procedure: TRANSESOPHAGEAL ECHOCARDIOGRAM (TEE);  Surgeon: Orpah Cobb, MD;  Location: [facility] ENDOSCOPY;  Service: Cardiovascular;  Laterality: N/A;    History reviewed. No pertinent family history.  Social History:  reports that he has been smoking cigarettes. He has been smoking an average of .5 packs per day. He has never used smokeless tobacco. He reports that he does not currently use alcohol. He reports  that he does not currently use drugs after having used the following drugs: IV.  Allergies:  Allergies  Allergen Reactions   Bee Venom Anaphylaxis and Swelling    Swelling all over    Medications: I have reviewed the patient's current medications.  Results for orders placed or performed during the hospital encounter of 11/14/21 (from the past 48 hour(s))  Magnesium     Status: Abnormal   Collection Time: 11/14/21  7:07 PM  Result Value Ref Range   Magnesium 1.6 (L) 1.7 - 2.4 mg/dL    Comment: Performed at [facility], [address] [place], [place] [address]  Protime-INR     Status: Abnormal   Collection Time: 11/15/21  4:31 AM  Result Value Ref Range   Prothrombin Time 15.4 (H) 11.4 - 15.2 seconds   INR 1.2 0.8 - 1.2    Comment: (NOTE) INR goal varies based on device and disease states. Performed at [facility], [address] [place], [place] [address]   Cortisol-am, blood     Status: Abnormal   Collection Time: 11/15/21  4:31 AM  Result Value Ref Range   Cortisol - AM 39.7 (H) 6.7 - 22.6 ug/dL    Comment: Performed at [facility], [address], [place], [place] [address]  Procalcitonin     Status: None   Collection Time: 11/15/21  4:31 AM  Result Value Ref Range  Procalcitonin 7.95 ng/mL    Comment:        Interpretation: PCT > 2 ng/mL: Systemic infection (sepsis) is likely, unless other causes are known. (NOTE)       Sepsis PCT Algorithm           Lower Respiratory Tract                                      Infection PCT Algorithm    ----------------------------     ----------------------------         PCT < 0.25 ng/mL                PCT < 0.10 ng/mL          Strongly encourage             Strongly discourage   discontinuation of antibiotics    initiation of antibiotics    ----------------------------     -----------------------------       PCT 0.25 - 0.50 ng/mL            PCT 0.10 - 0.25 ng/mL                OR       >80% decrease in PCT            Discourage initiation of                                            antibiotics      Encourage discontinuation           of antibiotics    ----------------------------     -----------------------------         PCT >= 0.50 ng/mL              PCT 0.26 - 0.50 ng/mL               AND       <80% decrease in PCT              Encourage initiation of                                             antibiotics       Encourage continuation           of antibiotics    ----------------------------     -----------------------------        PCT >= 0.50 ng/mL                  PCT > 0.50 ng/mL               AND         increase in PCT                  Strongly encourage                                      initiation of antibiotics    Strongly encourage escalation           of antibiotics                                     -----------------------------  PCT <= 0.25 ng/mL                                                 OR                                        > 80% decrease in PCT                                      Discontinue / Do not initiate                                             antibiotics  Performed at [facility], [address] [place], [place] [address]   Comprehensive metabolic panel     Status: Abnormal   Collection Time: 11/15/21  4:31 AM  Result Value Ref Range   Sodium 131 (L) 135 - 145 mmol/L   Potassium 3.5 3.5 - 5.1 mmol/L   Chloride 98 98 - 111 mmol/L   CO2 23 22 - 32 mmol/L   Glucose, Bld 148 (H) 70 - 99 mg/dL    Comment: Glucose reference range applies only to samples taken after fasting for at least 8 hours.   BUN 48 (H) 6 - 20 mg/dL   Creatinine, Ser 6.04 0.61 - 1.24 mg/dL   Calcium 7.5 (L) 8.9 - 10.3 mg/dL   Total Protein 4.5 (L) 6.5 - 8.1 g/dL   Albumin 1.6 (L) 3.5 - 5.0 g/dL   AST 37 15 - 41 U/L   ALT 20 0 - 44 U/L   Alkaline Phosphatase 96 38 - 126 U/L    Total Bilirubin 1.4 (H) 0.3 - 1.2 mg/dL   GFR, Estimated >54 >09 mL/min    Comment: (NOTE) Calculated using the CKD-EPI Creatinine Equation (2021)    Anion gap 10 5 - 15    Comment: Performed at [facility], [address], [place], [place] [address]  CBC     Status: Abnormal   Collection Time: 11/15/21  4:31 AM  Result Value Ref Range   WBC 37.4 (H) 4.0 - 10.5 K/uL   RBC 3.38 (L) 4.22 - 5.81 MIL/uL   Hemoglobin 9.7 (L) 13.0 - 17.0 g/dL    Comment: REPEATED TO VERIFY DELTA CHECK NOTED. RECOMMEND RECOLLECT TO VERIFY.    HCT 28.0 (L) 39.0 - 52.0 %   MCV 82.8 80.0 - 100.0 fL   MCH 28.7 26.0 - 34.0 pg   MCHC 34.6 30.0 - 36.0 g/dL   RDW 47.8 29.5 - 62.1 %   Platelets 313 150 - 400 K/uL   nRBC 0.1 0.0 - 0.2 %    Comment: Performed at [facility], [address] [place], [place] [address]  Aerobic/Anaerobic Culture w Gram Stain (surgical/deep wound)     Status: None (Preliminary result)   Collection Time: 11/15/21  5:46 PM   Specimen: PATH Other; Tissue  Result Value Ref Range   Specimen Description      WRIST RIGHT Performed at [facility],  [address], [place], [place] [address]    Special Requests      NONE Performed at [facility], [address], [place], [place] [address]    Gram Stain      FEW WBC PRESENT, PREDOMINANTLY MONONUCLEAR FEW GRAM POSITIVE COCCI IN PAIRS AND CHAINS    Culture      TOO YOUNG TO READ Performed at [facility], [address], [place], [place] [address]    Report Status PENDING   Aerobic/Anaerobic Culture w Gram Stain (surgical/deep wound)     Status: None (Preliminary result)   Collection Time: 11/15/21  6:35 PM   Specimen: PATH Other; Tissue  Result Value Ref Range   Specimen Description      HAND LEFT DORSAL Performed at [facility], [address], [place], [place] [address]    Special Requests      NONE Performed at [facility], [address], [place], [place] [address]    Gram Stain      MODERATE WBC PRESENT, PREDOMINANTLY MONONUCLEAR ABUNDANT GRAM POSITIVE COCCI    Culture      NO GROWTH < 12 HOURS Performed at [facility], [address], [place], [place] [address]    Report Status PENDING   Basic metabolic panel     Status: Abnormal   Collection Time: 11/16/21  4:51 AM  Result Value Ref Range   Sodium 133 (L) 135 - 145 mmol/L   Potassium 4.4 3.5 - 5.1 mmol/L    Comment: DELTA CHECK NOTED   Chloride 100 98 - 111 mmol/L   CO2 24 22 - 32 mmol/L   Glucose, Bld 126 (H) 70 - 99 mg/dL    Comment: Glucose reference range applies only to samples taken after fasting for at least 8 hours.   BUN 40 (H) 6 - 20 mg/dL   Creatinine, Ser 4.70 0.61 - 1.24 mg/dL   Calcium 7.6 (L) 8.9 - 10.3 mg/dL   GFR, Estimated >96 >28 mL/min    Comment: (NOTE) Calculated using the CKD-EPI Creatinine Equation (2021)    Anion gap 9 5 - 15    Comment: Performed at [facility], [address] [place], [place] [address]  CBC     Status: Abnormal   Collection Time: 11/16/21  4:51 AM  Result Value Ref Range   WBC 39.1 (H) 4.0 - 10.5 K/uL   RBC 2.87 (L) 4.22 - 5.81 MIL/uL   Hemoglobin 8.1 (L) 13.0 - 17.0 g/dL   HCT 47.6 (L) 54.6 - 50.3 %   MCV 88.5 80.0 - 100.0 fL   MCH 28.2 26.0 - 34.0 pg   MCHC 31.9 30.0 - 36.0 g/dL   RDW 54.6 56.8 - 12.7 %   Platelets 314 150 - 400 K/uL   nRBC 0.2 0.0 - 0.2 %    Comment: Performed at [facility] [address] [place], [place] [address]  Magnesium     Status: Abnormal   Collection Time: 11/16/21  4:51 AM  Result Value Ref Range   Magnesium 1.6 (L) 1.7 - 2.4 mg/dL    Comment: Performed at [facility], [address], [place], [place] [address]  Culture, blood (Routine X 2) w Reflex to ID Panel     Status: None (Preliminary result)   Collection Time: 11/16/21  4:51 AM   Specimen: BLOOD  Result  Value Ref Range   Specimen Description  BLOOD RIGHT FOOT Performed at [facility], [address], [place], [place] [address]    Special Requests      BOTTLES DRAWN AEROBIC ONLY Blood Culture adequate volume Performed at [facility], [address], [place], [place] [address]    Culture      NO GROWTH < 12 HOURS Performed at [facility], [address] [place], [place] [address]    Report Status PENDING   Culture, blood (Routine X 2) w Reflex to ID Panel     Status: None (Preliminary result)   Collection Time: 11/16/21  4:51 AM   Specimen: BLOOD  Result Value Ref Range   Specimen Description      BLOOD RIGHT ANKLE Performed at [facility], [address], [place], [place] [address]    Special Requests      BOTTLES DRAWN AEROBIC ONLY Blood Culture adequate volume Performed at [facility], [address], [place], [place] [address]    Culture      NO GROWTH < 12 HOURS Performed at [facility], [address] [place], [place] [address]    Report Status PENDING     No results found.  ROS moderately positive for all pain questions Blood pressure (!) 176/76, pulse (!) 104, temperature 98.9 F (37.2 C), temperature source Oral, resp. rate 18, height 5\' 7"  (1.702 m), weight 60 kg, SpO2 100 %. Physical Exam laying motionless on his side in bed answering occasional questions occasionally opening his eyes bandages on both arms at least 1 unit of blood probably soaking those bandages abdomen is soft nontender labs and CT at outlying hospital a few months ago reviewed   Assessment/Plan: Multiple medical problems including GI blood loss Plan: We will change diet to clear liquids we will try to slowly lower pain medicines will make n.p.o. after midnight and consider beginning his work-up with an endoscopy at some point please call me sooner if signs of active bleeding and might consider a  nuclear bleeding scan at that time and will add Protonix to his regiment  [facility] 11/16/2021, 3:52 PM

## 2021-11-17 ENCOUNTER — Encounter (HOSPITAL_COMMUNITY)
Admission: EM | Disposition: A | Discharge status: Home / Self Care | Payer: Self-pay | Source: Home / Self Care | Attending: Internal Medicine

## 2021-11-17 ENCOUNTER — Inpatient Hospital Stay (HOSPITAL_COMMUNITY): Payer: 59 | Admitting: Anesthesiology

## 2021-11-17 ENCOUNTER — Inpatient Hospital Stay (HOSPITAL_COMMUNITY): Payer: 59

## 2021-11-17 ENCOUNTER — Other Ambulatory Visit: Payer: Self-pay

## 2021-11-17 ENCOUNTER — Encounter (HOSPITAL_COMMUNITY): Payer: Self-pay | Admitting: Internal Medicine

## 2021-11-17 DIAGNOSIS — L02519 Cutaneous abscess of unspecified hand: Secondary | ICD-10-CM

## 2021-11-17 DIAGNOSIS — J9601 Acute respiratory failure with hypoxia: Secondary | ICD-10-CM

## 2021-11-17 DIAGNOSIS — F1990 Other psychoactive substance use, unspecified, uncomplicated: Secondary | ICD-10-CM

## 2021-11-17 HISTORY — PX: SCLEROTHERAPY: SHX6841

## 2021-11-17 HISTORY — PX: ESOPHAGOGASTRODUODENOSCOPY (EGD) WITH PROPOFOL: SHX5813

## 2021-11-17 HISTORY — PX: I & D EXTREMITY: SHX5045

## 2021-11-17 HISTORY — PX: BIOPSY: SHX5522

## 2021-11-17 LAB — CBC WITH DIFFERENTIAL/PLATELET
Abs Immature Granulocytes: 5.67 10*3/uL — ABNORMAL HIGH (ref 0.00–0.07)
Basophils Absolute: 0.1 10*3/uL (ref 0.0–0.1)
Basophils Relative: 0 %
Eosinophils Absolute: 0 10*3/uL (ref 0.0–0.5)
Eosinophils Relative: 0 %
HCT: 24.6 % — ABNORMAL LOW (ref 39.0–52.0)
Hemoglobin: 8.5 g/dL — ABNORMAL LOW (ref 13.0–17.0)
Immature Granulocytes: 10 %
Lymphocytes Relative: 9 %
Lymphs Abs: 5.5 10*3/uL — ABNORMAL HIGH (ref 0.7–4.0)
MCH: 28.9 pg (ref 26.0–34.0)
MCHC: 34.6 g/dL (ref 30.0–36.0)
MCV: 83.7 fL (ref 80.0–100.0)
Monocytes Absolute: 2.1 10*3/uL — ABNORMAL HIGH (ref 0.1–1.0)
Monocytes Relative: 4 %
Neutro Abs: 45.7 10*3/uL — ABNORMAL HIGH (ref 1.7–7.7)
Neutrophils Relative %: 77 %
Platelets: 317 10*3/uL (ref 150–400)
RBC: 2.94 MIL/uL — ABNORMAL LOW (ref 4.22–5.81)
RDW: 15.1 % (ref 11.5–15.5)
WBC: 59 10*3/uL (ref 4.0–10.5)
nRBC: 1.1 % — ABNORMAL HIGH (ref 0.0–0.2)

## 2021-11-17 LAB — COMPREHENSIVE METABOLIC PANEL
ALT: 24 U/L (ref 0–44)
AST: 42 U/L — ABNORMAL HIGH (ref 15–41)
Albumin: <1.5 g/dL — ABNORMAL LOW (ref 3.5–5.0)
Alkaline Phosphatase: 78 U/L (ref 38–126)
Anion gap: 6 (ref 5–15)
BUN: 35 mg/dL — ABNORMAL HIGH (ref 6–20)
CO2: 21 mmol/L — ABNORMAL LOW (ref 22–32)
Calcium: 7.2 mg/dL — ABNORMAL LOW (ref 8.9–10.3)
Chloride: 106 mmol/L (ref 98–111)
Creatinine, Ser: 0.64 mg/dL (ref 0.61–1.24)
GFR, Estimated: >60 mL/min (ref >60)
Glucose, Bld: 118 mg/dL — ABNORMAL HIGH (ref 70–99)
Potassium: 4.7 mmol/L (ref 3.5–5.1)
Sodium: 133 mmol/L — ABNORMAL LOW (ref 135–145)
Total Bilirubin: 0.6 mg/dL (ref 0.3–1.2)
Total Protein: 5.2 g/dL — ABNORMAL LOW (ref 6.5–8.1)

## 2021-11-17 LAB — HEMOGLOBIN AND HEMATOCRIT, BLOOD
HCT: 25.4 % — ABNORMAL LOW (ref 39.0–52.0)
Hemoglobin: 8.7 g/dL — ABNORMAL LOW (ref 13.0–17.0)

## 2021-11-17 LAB — BASIC METABOLIC PANEL
Anion gap: 6 (ref 5–15)
BUN: 37 mg/dL — ABNORMAL HIGH (ref 6–20)
CO2: 21 mmol/L — ABNORMAL LOW (ref 22–32)
Calcium: 7 mg/dL — ABNORMAL LOW (ref 8.9–10.3)
Chloride: 105 mmol/L (ref 98–111)
Creatinine, Ser: 0.62 mg/dL (ref 0.61–1.24)
GFR, Estimated: >60 mL/min (ref >60)
Glucose, Bld: 108 mg/dL — ABNORMAL HIGH (ref 70–99)
Potassium: 4.5 mmol/L (ref 3.5–5.1)
Sodium: 132 mmol/L — ABNORMAL LOW (ref 135–145)

## 2021-11-17 LAB — BLOOD GAS, VENOUS
Acid-base deficit: 1.8 mmol/L (ref 0.0–2.0)
Bicarbonate: 21.5 mmol/L (ref 20.0–28.0)
O2 Saturation: 91.4 %
Patient temperature: 98.6
pCO2, Ven: 32.5 mmHg — ABNORMAL LOW (ref 44.0–60.0)
pH, Ven: 7.436 — ABNORMAL HIGH (ref 7.250–7.430)
pO2, Ven: 62.2 mmHg — ABNORMAL HIGH (ref 32.0–45.0)

## 2021-11-17 LAB — MAGNESIUM: Magnesium: 1.6 mg/dL — ABNORMAL LOW (ref 1.7–2.4)

## 2021-11-17 LAB — CBC
HCT: 17.1 % — ABNORMAL LOW (ref 39.0–52.0)
Hemoglobin: 5.8 g/dL — CL (ref 13.0–17.0)
MCH: 29 pg (ref 26.0–34.0)
MCHC: 33.9 g/dL (ref 30.0–36.0)
MCV: 85.5 fL (ref 80.0–100.0)
Platelets: 289 10*3/uL (ref 150–400)
RBC: 2 MIL/uL — ABNORMAL LOW (ref 4.22–5.81)
RDW: 15.5 % (ref 11.5–15.5)
WBC: 49.8 10*3/uL — ABNORMAL HIGH (ref 4.0–10.5)
nRBC: 1.2 % — ABNORMAL HIGH (ref 0.0–0.2)

## 2021-11-17 LAB — CULTURE, BLOOD (ROUTINE X 2)

## 2021-11-17 LAB — PREPARE RBC (CROSSMATCH)

## 2021-11-17 LAB — MRSA NEXT GEN BY PCR, NASAL: MRSA by PCR Next Gen: DETECTED — AB

## 2021-11-17 LAB — PROTIME-INR
INR: 1.1 (ref 0.8–1.2)
Prothrombin Time: 14.6 seconds (ref 11.4–15.2)

## 2021-11-17 LAB — LACTIC ACID, PLASMA: Lactic Acid, Venous: 1.1 mmol/L (ref 0.5–1.9)

## 2021-11-17 LAB — PROCALCITONIN: Procalcitonin: 1.82 ng/mL

## 2021-11-17 SURGERY — IRRIGATION AND DEBRIDEMENT EXTREMITY
Anesthesia: General | Laterality: Right

## 2021-11-17 SURGERY — ESOPHAGOGASTRODUODENOSCOPY (EGD) WITH PROPOFOL
Anesthesia: General

## 2021-11-17 MED ORDER — LACTATED RINGERS IV SOLN: INTRAVENOUS | Status: DC | PRN

## 2021-11-17 MED ORDER — PROPOFOL 10 MG/ML IV BOLUS
INTRAVENOUS | Status: DC | PRN
  Administered 2021-11-17: 40 mg via INTRAVENOUS
  Administered 2021-11-17: 200 mg via INTRAVENOUS

## 2021-11-17 MED ORDER — CHLORHEXIDINE GLUCONATE CLOTH 2 % EX PADS
6.0000 | MEDICATED_PAD | Freq: Every day | CUTANEOUS | Status: DC
  Administered 2021-11-17 – 2022-01-08 (×47): 6 via TOPICAL

## 2021-11-17 MED ORDER — ORAL CARE MOUTH RINSE
15.0000 mL | OROMUCOSAL | Status: DC
  Administered 2021-11-17 (×3): 15 mL via OROMUCOSAL

## 2021-11-17 MED ORDER — AMISULPRIDE (ANTIEMETIC) 5 MG/2ML IV SOLN: 10.0000 mg | Freq: Once | INTRAVENOUS | Status: DC | PRN

## 2021-11-17 MED ORDER — ONDANSETRON HCL 4 MG/2ML IJ SOLN
INTRAMUSCULAR | Status: DC | PRN
  Administered 2021-11-17: 4 mg via INTRAVENOUS

## 2021-11-17 MED ORDER — FENTANYL 2500MCG IN NS 250ML (10MCG/ML) PREMIX INFUSION
0.0000 ug/h | INTRAVENOUS | Status: DC
  Administered 2021-11-17: 25 ug/h via INTRAVENOUS
  Filled 2021-11-17: qty 250

## 2021-11-17 MED ORDER — PANTOPRAZOLE 80MG IVPB - SIMPLE MED
80.0000 mg | Freq: Once | INTRAVENOUS | Status: AC
  Administered 2021-11-17: 80 mg via INTRAVENOUS
  Filled 2021-11-17: qty 80

## 2021-11-17 MED ORDER — CHLORHEXIDINE GLUCONATE 0.12% ORAL RINSE (MEDLINE KIT)
15.0000 mL | Freq: Two times a day (BID) | OROMUCOSAL | Status: DC
  Administered 2021-11-17 (×2): 15 mL via OROMUCOSAL

## 2021-11-17 MED ORDER — SODIUM CHLORIDE 0.9% IV SOLUTION: Freq: Once | INTRAVENOUS | Status: DC

## 2021-11-17 MED ORDER — CALCIUM GLUCONATE-NACL 1-0.675 GM/50ML-% IV SOLN
1.0000 g | Freq: Once | INTRAVENOUS | Status: AC
  Administered 2021-11-17: 1000 mg via INTRAVENOUS
  Filled 2021-11-17 (×2): qty 50

## 2021-11-17 MED ORDER — FENTANYL CITRATE PF 50 MCG/ML IJ SOSY: 25.0000 ug | PREFILLED_SYRINGE | INTRAMUSCULAR | Status: DC | PRN

## 2021-11-17 MED ORDER — SODIUM CHLORIDE (PF) 0.9 % IJ SOLN
PREFILLED_SYRINGE | INTRAMUSCULAR | Status: DC | PRN
  Administered 2021-11-17: 4 mL

## 2021-11-17 MED ORDER — SODIUM CHLORIDE 0.9 % IV SOLN
8.0000 mg/kg | Freq: Every day | INTRAVENOUS | Status: DC
  Administered 2021-11-17 – 2021-12-22 (×35): 500 mg via INTRAVENOUS
  Filled 2021-11-17 (×38): qty 10

## 2021-11-17 MED ORDER — METRONIDAZOLE 500 MG/100ML IV SOLN
500.0000 mg | Freq: Two times a day (BID) | INTRAVENOUS | Status: DC
  Administered 2021-11-17 (×2): 500 mg via INTRAVENOUS
  Filled 2021-11-17 (×3): qty 100

## 2021-11-17 MED ORDER — DEXAMETHASONE SODIUM PHOSPHATE 4 MG/ML IJ SOLN
INTRAMUSCULAR | Status: DC | PRN
  Administered 2021-11-17: 10 mg via INTRAVENOUS

## 2021-11-17 MED ORDER — HYDROMORPHONE HCL 1 MG/ML IJ SOLN
INTRAMUSCULAR | Status: DC | PRN
  Administered 2021-11-17: .6 mg via INTRAVENOUS

## 2021-11-17 MED ORDER — HYDROMORPHONE HCL 2 MG/ML IJ SOLN
INTRAMUSCULAR | Status: AC
  Filled 2021-11-17: qty 1

## 2021-11-17 MED ORDER — MAGNESIUM SULFATE 2 GM/50ML IV SOLN
2.0000 g | Freq: Once | INTRAVENOUS | Status: AC
  Administered 2021-11-17: 2 g via INTRAVENOUS
  Filled 2021-11-17: qty 50

## 2021-11-17 MED ORDER — MUPIROCIN 2 % EX OINT
1.0000 "application " | TOPICAL_OINTMENT | Freq: Two times a day (BID) | CUTANEOUS | Status: AC
  Administered 2021-11-17 – 2021-11-22 (×10): 1 via NASAL
  Filled 2021-11-17 (×3): qty 22

## 2021-11-17 MED ORDER — PROPOFOL 1000 MG/100ML IV EMUL
5.0000 ug/kg/min | INTRAVENOUS | Status: DC
  Administered 2021-11-17: 5 ug/kg/min via INTRAVENOUS
  Filled 2021-11-17: qty 100

## 2021-11-17 MED ORDER — SODIUM CHLORIDE 0.9 % IV SOLN
2.0000 g | INTRAVENOUS | Status: DC
  Administered 2021-11-17 – 2021-11-20 (×4): 2 g via INTRAVENOUS
  Filled 2021-11-17 (×5): qty 20

## 2021-11-17 MED ORDER — CHLORHEXIDINE GLUCONATE 0.12 % MT SOLN
OROMUCOSAL | Status: AC
  Filled 2021-11-17: qty 15

## 2021-11-17 MED ORDER — PROMETHAZINE HCL 25 MG/ML IJ SOLN
6.2500 mg | INTRAMUSCULAR | Status: DC | PRN
Start: 2021-11-17 — End: 2021-11-17

## 2021-11-17 MED ORDER — SODIUM CHLORIDE 0.9% IV SOLUTION: Freq: Once | INTRAVENOUS | Status: AC

## 2021-11-17 MED ORDER — FENTANYL CITRATE (PF) 100 MCG/2ML IJ SOLN
INTRAMUSCULAR | Status: AC
  Filled 2021-11-17: qty 2

## 2021-11-17 MED ORDER — FENTANYL CITRATE (PF) 100 MCG/2ML IJ SOLN
INTRAMUSCULAR | Status: DC | PRN
  Administered 2021-11-17 (×2): 50 ug via INTRAVENOUS

## 2021-11-17 MED ORDER — PANTOPRAZOLE INFUSION (NEW) - SIMPLE MED
8.0000 mg/h | INTRAVENOUS | Status: DC
  Administered 2021-11-17 – 2021-11-18 (×4): 8 mg/h via INTRAVENOUS
  Filled 2021-11-17 (×2): qty 100
  Filled 2021-11-17: qty 80
  Filled 2021-11-17 (×2): qty 100
  Filled 2021-11-17: qty 80

## 2021-11-17 MED ORDER — SODIUM CHLORIDE 0.9 % IR SOLN
Status: DC | PRN
  Administered 2021-11-17 (×2): 3000 mL

## 2021-11-17 MED ORDER — PROPOFOL 10 MG/ML IV BOLUS
INTRAVENOUS | Status: AC
  Filled 2021-11-17: qty 20

## 2021-11-17 MED ORDER — PANTOPRAZOLE SODIUM 40 MG IV SOLR: 40.0000 mg | Freq: Two times a day (BID) | INTRAVENOUS | Status: DC

## 2021-11-17 MED ORDER — ETOMIDATE 2 MG/ML IV SOLN
INTRAVENOUS | Status: AC
  Administered 2021-11-17: 20 mg
  Filled 2021-11-17: qty 10

## 2021-11-17 MED ORDER — SUCCINYLCHOLINE CHLORIDE 200 MG/10ML IV SOSY
PREFILLED_SYRINGE | INTRAVENOUS | Status: DC | PRN
Start: 2021-11-17 — End: 2021-11-17
  Administered 2021-11-17: 140 mg via INTRAVENOUS

## 2021-11-17 MED ORDER — EPINEPHRINE 1 MG/10ML IJ SOSY
PREFILLED_SYRINGE | INTRAMUSCULAR | Status: AC
  Filled 2021-11-17: qty 10

## 2021-11-17 SURGICAL SUPPLY — 37 items
BAG COUNTER SPONGE SURGICOUNT (BAG) ×1 IMPLANT
BAG SPEC THK2 15X12 ZIP CLS (MISCELLANEOUS) ×1
BAG SPNG CNTER NS LX DISP (BAG) ×1
BAG ZIPLOCK 12X15 (MISCELLANEOUS) ×2 IMPLANT
BNDG ELASTIC 4X5.8 VLCR STR LF (GAUZE/BANDAGES/DRESSINGS) ×2 IMPLANT
BNDG GAUZE ELAST 4 BULKY (GAUZE/BANDAGES/DRESSINGS) ×4 IMPLANT
CORD BIPOLAR FORCEPS 12FT (ELECTRODE) ×1 IMPLANT
COVER SURGICAL LIGHT HANDLE (MISCELLANEOUS) ×2 IMPLANT
CUFF TOURN SGL QUICK 18X4 (TOURNIQUET CUFF) ×1 IMPLANT
CUFF TOURN SGL QUICK 24 (TOURNIQUET CUFF) ×2
CUFF TRNQT CYL 24X4X16.5-23 (TOURNIQUET CUFF) IMPLANT
DRAIN PENROSE 0.5X18 (DRAIN) IMPLANT
DRAPE SURG 17X11 SM STRL (DRAPES) ×4 IMPLANT
ELECT REM PT RETURN 15FT ADLT (MISCELLANEOUS) ×2 IMPLANT
GAUZE SPONGE 4X4 12PLY STRL (GAUZE/BANDAGES/DRESSINGS) ×5 IMPLANT
GAUZE XEROFORM 5X9 LF (GAUZE/BANDAGES/DRESSINGS) ×1 IMPLANT
GLOVE SURG ENC TEXT LTX SZ8 (GLOVE) ×4 IMPLANT
GLOVE SURG UNDER POLY LF SZ6.5 (GLOVE) ×2 IMPLANT
GLOVE SURG UNDER POLY LF SZ7 (GLOVE) ×2 IMPLANT
GLOVE SURG UNDER POLY LF SZ7.5 (GLOVE) ×2 IMPLANT
HANDPIECE INTERPULSE COAX TIP (DISPOSABLE)
KIT BASIN OR (CUSTOM PROCEDURE TRAY) ×2 IMPLANT
KIT TURNOVER KIT A (KITS) IMPLANT
NEEDLE HYPO 22GX1.5 SAFETY (NEEDLE) ×1 IMPLANT
NS IRRIG 1000ML POUR BTL (IV SOLUTION) ×1 IMPLANT
PACK ORTHO EXTREMITY (CUSTOM PROCEDURE TRAY) ×2 IMPLANT
PENCIL SMOKE EVACUATOR (MISCELLANEOUS) IMPLANT
PROTECTOR NERVE ULNAR (MISCELLANEOUS) ×1 IMPLANT
SET HNDPC FAN SPRY TIP SCT (DISPOSABLE) ×1 IMPLANT
STOCKINETTE 6  STRL (DRAPES)
STOCKINETTE 6 STRL (DRAPES) ×1 IMPLANT
SUT ETHILON 3 0 PS 1 (SUTURE) ×2 IMPLANT
SUT ETHILON 4 0 PS 2 18 (SUTURE) ×2 IMPLANT
SWAB COLLECTION DEVICE MRSA (MISCELLANEOUS) IMPLANT
SWAB CULTURE ESWAB REG 1ML (MISCELLANEOUS) IMPLANT
SYR CONTROL 10ML LL (SYRINGE) ×1 IMPLANT
YANKAUER SUCT BULB TIP 10FT TU (MISCELLANEOUS) ×1 IMPLANT

## 2021-11-17 SURGICAL SUPPLY — 15 items

## 2021-11-17 NOTE — Progress Notes (Signed)
Jason Buchanan 8:45 AM  Subjective: Patient seen and examined and mentally more with it today Case discussed with his nurse and he did not have a bowel movement from yesterday until this morning and it is obviously dark  maroon and he denies any abdominal pain and does want something to drink and just got his first unit of blood despite a hemoglobin of 6 yesterday and has no new complaints  Objective: Vital signs stable afebrile lungs are clear heart tachycardia abdomen is soft nontender labs reviewed BUN actually decreased hemoglobin slight decrease from yesterday  Assessment: GI blood loss  Plan: The risk benefits and methods of endoscopy was discussed and will proceed this morning ASAP with further work-up and plans pending those findings  [facility]  office [phone] After 5PM or if no answer call [phone]

## 2021-11-17 NOTE — Progress Notes (Signed)
S: Events of last 24 hours noted, bleeding episode, decreased HCT, ICU.  Currently intubated/sedated; unable to obtain consent for repeat I&D.  O:Blood pressure (!) 177/73, pulse (!) 104, temperature 97.7 F (36.5 C), temperature source Axillary, resp. rate (!) 25, height 5\' 7"  (1.702 m), weight 60 kg, SpO2 100 %.    A:sepsis, bilateral UE cellulitis/abscesses; s/p I&D 1/13   P: Will continue as planned with I&D RUE, ? LUE

## 2021-11-17 NOTE — Anesthesia Procedure Notes (Signed)
Procedure Name: Intubation Date/Time: 11/17/2021 10:48 AM Performed by: Caren Macadam, CRNA Pre-anesthesia Checklist: Patient identified, Emergency Drugs available, Suction available and Patient being monitored Patient Re-evaluated:Patient Re-evaluated prior to induction Oxygen Delivery Method: Circle system utilized Preoxygenation: Pre-oxygenation with 100% oxygen Induction Type: IV induction Ventilation: Mask ventilation without difficulty Laryngoscope Size: Miller and 2 Grade View: Grade I Tube type: Oral Tube size: 7.5 mm Number of attempts: 1 Airway Equipment and Method: Stylet and Oral airway Placement Confirmation: ETT inserted through vocal cords under direct vision, positive ETCO2 and breath sounds checked- equal and bilateral Secured at: 23 cm Tube secured with: Tape Dental Injury: Teeth and Oropharynx as per pre-operative assessment

## 2021-11-17 NOTE — Transfer of Care (Signed)
Immediate Anesthesia Transfer of Care Note  Patient: Jason Buchanan  Procedure(s) Performed: IRRIGATION AND DEBRIDEMENT EXTREMITY (Right)  Patient Location: PACU  Anesthesia Type:General  Level of Consciousness: drowsy  Airway & Oxygen Therapy: Patient Spontanous Breathing  Post-op Assessment: Report given to RN and Post -op Vital signs reviewed and stable  Post vital signs: Reviewed and stable  Last Vitals:  Vitals Value Taken Time  BP 143/82 11/17/21 1730  Temp    Pulse 93 11/17/21 1731  Resp 25 11/17/21 1731  SpO2 100 % 11/17/21 1731  Vitals shown include unvalidated device data.  Last Pain:  Vitals:   11/17/21 1200  TempSrc: Axillary  PainSc:       Patients Stated Pain Goal: 1 (11/16/21 2050)  Complications: No notable events documented.

## 2021-11-17 NOTE — Procedures (Signed)
Central Venous Catheter Insertion Procedure Note  KAMARI BUCH  [id number]  1986-07-03  Date:11/17/21  Time:1:45 PM   Provider Performing:Marolyn Urschel Erby Pian   Procedure: Insertion of Non-tunneled Central Venous [phone]) with US guidance ([id number])   Indication(s) Difficult access  Consent Risks of the procedure as well as the alternatives and risks of each were explained to the patient and/or caregiver.  Consent for the procedure was obtained and is signed in the bedside chart Verbal from patient  Anesthesia In place for ETT  Timeout Verified patient identification, verified procedure, site/side was marked, verified correct patient position, special equipment/implants available, medications/allergies/relevant history reviewed, required imaging and test results available.  Sterile Technique Maximal sterile technique including full sterile barrier drape, hand hygiene, sterile gown, sterile gloves, mask, hair covering, sterile ultrasound probe cover (if used).  Procedure Description Area of catheter insertion was cleaned with chlorhexidine and draped in sterile fashion.  With real-time ultrasound guidance a central venous catheter was placed into the left internal jugular vein. Nonpulsatile blood flow and easy flushing noted in all ports.  The catheter was sutured in place and sterile dressing applied.  Complications/Tolerance None; patient tolerated the procedure well. Chest X-ray is ordered to verify placement for internal jugular or subclavian cannulation.   Chest x-ray is not ordered for femoral cannulation.  EBL Minimal  Specimen(s) None

## 2021-11-17 NOTE — Progress Notes (Signed)
Pharmacy Antibiotic Note  Jason Buchanan is a 35 y.o. male admitted on 11/14/2021 with bilateral upper extremity wounds now s/p fasciotomy and I&D also being treated for IAI.  Pharmacy has been consulted for daptomycin dosing.  Plan: Daptomycin 8 mg/kg daily   Ceftriaxone and Flagyl per MD  Will f/u renal function, culture results, and clinical course Weekly CK  Height: 5\' 7"  (170.2 cm) Weight: 60 kg (132 lb 4.4 oz) IBW/kg (Calculated) : 66.1  Temp (24hrs), Avg:98.3 F (36.8 C), Min:97.7 F (36.5 C), Max:98.7 F (37.1 C)  Recent Labs  Lab 11/14/21 0854 11/14/21 1135 11/15/21 0431 11/16/21 0451 11/17/21 0404 11/17/21 1352  WBC 27.6*  --  37.4* 39.1* 49.8* 59.0*  CREATININE 1.19  --  0.87 0.62 0.62 0.64  LATICACIDVEN 3.5* 2.4*  --   --   --  1.1    Estimated Creatinine Clearance: 109.4 mL/min (by C-G formula based on SCr of 0.64 mg/dL).    Allergies  Allergen Reactions   Bee Venom Anaphylaxis and Swelling    Swelling all over    Thank you for allowing pharmacy to be a part of this patients care.  11/19/21 11/17/2021 3:37 PM
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
## 2021-11-17 NOTE — Progress Notes (Signed)
[facility]  Date of Admission:  11/14/2021     Total days of antibiotics 4         ASSESSMENT:  Jason Buchanan underwent EGD with concern for GI bleed and anemia with no significant findings of bleeding. Currently in the ICU for continued monitoring with planned return to the OR for repeat debridement of his right and possibly left upper extremity. Blood cultures from 11/16/21 have been without growth to date. Surgical cultures with Group A Streptococcus and now Staphylococcus aureus. Antibiotics have been changed to ceftriaxone and metronidazole with the potential concern for intra-abdominal infection. Add Daptomycin for Staph aureus coverage pending sensitivities. Will narrow antibiotics pending sensitivities. Continue remaining medical and supportive care per primary team.   PLAN:  Continue current dose of ceftriaxone and metronidazole and add Daptomycin. Monitor CK levels while on Daptomycin.  Repeat I&D today with Hand Surgery. Monitor cultures for clearance of bacteremia.  Monitor surgical cultures  Remaining medical and supportive care per primary team.   Principal Problem:   Sepsis ([name]) Active Problems:   Streptococcal bacteremia   Right arm cellulitis    sodium chloride   Intravenous Once   acetaminophen  650 mg Oral QID   Or   acetaminophen  650 mg Rectal QID   chlorhexidine gluconate (MEDLINE KIT)  15 mL Mouth Rinse BID   Chlorhexidine Gluconate Cloth  6 each Topical Daily   folic acid  1 mg Oral Daily   magnesium oxide  800 mg Oral BID   mouth rinse  15 mL Mouth Rinse 10 times per day   multivitamin with minerals  1 tablet Oral Daily   [START ON 11/20/2021] pantoprazole  40 mg Intravenous Q12H   thiamine  100 mg Oral Daily   Or   thiamine  100 mg Intravenous Daily    SUBJECTIVE:  Afebrile overnight with continued increasing leukocytosis up to 49.8. Hemoglobin continued to drop with concern for possible GI bleed. Plans to go to OR  for further I&D of the bilateral upper extremities. Sedated and intubated during visit.   Allergies  Allergen Reactions   Bee Venom Anaphylaxis and Swelling    Swelling all over     Review of Systems: Review of Systems  Unable to perform ROS: Intubated     OBJECTIVE: Vitals:   11/17/21 0958 11/17/21 1140 11/17/21 1200 11/17/21 1300  BP: (!) 185/72  (!) 159/85 (!) 177/73  Pulse: 73   (!) 104  Resp: 19  (!) 27 (!) 25  Temp: 97.7 F (36.5 C)     TempSrc: Axillary     SpO2: 100% 100% 100% 100%  Weight:      Height:       Body mass index is 20.72 kg/m.  Physical Exam Constitutional:      General: He is not in acute distress.    Appearance: He is well-developed.  Cardiovascular:     Rate and Rhythm: Normal rate and regular rhythm.     Heart sounds: Normal heart sounds.  Pulmonary:     Effort: Pulmonary effort is normal.     Breath sounds: Normal breath sounds.  Skin:    General: Skin is warm and dry.  Neurological:     Mental Status: He is oriented to person, place, and time.    Lab Results Lab Results  Component Value Date   WBC 49.8 (H) 11/17/2021   HGB 5.8 (LL) 11/17/2021   HCT 17.1 (L) 11/17/2021   MCV  85.5 11/17/2021   PLT 289 11/17/2021    Lab Results  Component Value Date   CREATININE 0.62 11/17/2021   BUN 37 (H) 11/17/2021   NA 132 (L) 11/17/2021   K 4.5 11/17/2021   CL 105 11/17/2021   CO2 21 (L) 11/17/2021    Lab Results  Component Value Date   ALT 20 11/15/2021   AST 37 11/15/2021   ALKPHOS 96 11/15/2021   BILITOT 1.4 (H) 11/15/2021     Microbiology: Recent Results (from the past 240 hour(s))  Resp Panel by RT-PCR (Flu A&B, Covid) Nasopharyngeal Swab     Status: None   Collection Time: 11/14/21  8:54 AM   Specimen: Nasopharyngeal Swab; Nasopharyngeal(NP) swabs in vial transport medium  Result Value Ref Range Status   SARS Coronavirus 2 by RT PCR NEGATIVE NEGATIVE Final    Comment: (NOTE) SARS-CoV-2 target nucleic acids are NOT  DETECTED.  The SARS-CoV-2 RNA is generally detectable in upper respiratory specimens during the acute phase of infection. The lowest concentration of SARS-CoV-2 viral copies this assay can detect is 138 copies/mL. A negative result does not preclude SARS-Cov-2 infection and should not be used as the sole basis for treatment or other patient management decisions. A negative result may occur with  improper specimen collection/handling, submission of specimen other than nasopharyngeal swab, presence of viral mutation(s) within the areas targeted by this assay, and inadequate number of viral copies(<138 copies/mL). A negative result must be combined with clinical observations, patient history, and epidemiological information. The expected result is Negative.  Fact Sheet for Patients:  EntrepreneurPulse.com.au  Fact Sheet for Healthcare Providers:  IncredibleEmployment.be  This test is no t yet approved or cleared by the [place] FDA and  has been authorized for detection and/or diagnosis of SARS-CoV-2 by FDA under an Emergency Use Authorization (EUA). This EUA will remain  in effect (meaning this test can be used) for the duration of the COVID-19 declaration under Section 564(b)(1) of the Act, 21 U.S.C.section 360bbb-3(b)(1), unless the authorization is terminated  or revoked sooner.       Influenza A by PCR NEGATIVE NEGATIVE Final   Influenza B by PCR NEGATIVE NEGATIVE Final    Comment: (NOTE) The Xpert Xpress SARS-CoV-2/FLU/RSV plus assay is intended as an aid in the diagnosis of influenza from Nasopharyngeal swab specimens and should not be used as a sole basis for treatment. Nasal washings and aspirates are unacceptable for Xpert Xpress SARS-CoV-2/FLU/RSV testing.  Fact Sheet for Patients: EntrepreneurPulse.com.au  Fact Sheet for Healthcare Providers: IncredibleEmployment.be  This test is not yet  approved or cleared by the [place] FDA and has been authorized for detection and/or diagnosis of SARS-CoV-2 by FDA under an Emergency Use Authorization (EUA). This EUA will remain in effect (meaning this test can be used) for the duration of the COVID-19 declaration under Section 564(b)(1) of the Act, 21 U.S.C. section 360bbb-3(b)(1), unless the authorization is terminated or revoked.  Performed at [facility], [place] [address], [place], [place] [address]   Blood Culture (routine x 2)     Status: Abnormal   Collection Time: 11/14/21  8:54 AM   Specimen: BLOOD  Result Value Ref Range Status   Specimen Description   Final    BLOOD LEFT ANTECUBITAL Performed at [facility] [address] [place], [place] [address]    Special Requests   Final    BOTTLES DRAWN AEROBIC AND ANAEROBIC Blood Culture results may not be optimal due to an  excessive volume of blood received in culture bottles Performed at [facility] [address] [place], [place] [address]    Culture  Setup Time   Final    GRAM POSITIVE COCCI IN CHAINS ANAEROBIC BOTTLE ONLY CRITICAL RESULT CALLED TO, READ BACK BY AND VERIFIED WITH: M LILLISTON,PHARMD'@0300'  11/15/21 [place]    Culture (A)  Final    STREPTOCOCCUS PYOGENES HEALTH DEPARTMENT NOTIFIED Performed at [facility], [place] [address], [place], [place] [address]    Report Status 11/17/2021 FINAL  Final   Organism ID, Bacteria STREPTOCOCCUS PYOGENES  Final      Susceptibility   Streptococcus pyogenes - MIC*    PENICILLIN <=0.06 SENSITIVE Sensitive     CEFTRIAXONE <=0.12 SENSITIVE Sensitive     ERYTHROMYCIN >=8 RESISTANT Resistant     LEVOFLOXACIN <=0.25 SENSITIVE Sensitive     VANCOMYCIN 0.5 SENSITIVE Sensitive     * STREPTOCOCCUS PYOGENES  Blood Culture ID Panel (Reflexed)     Status: Abnormal   Collection Time: 11/14/21  8:54 AM  Result Value Ref Range Status   Enterococcus faecalis NOT  DETECTED NOT DETECTED Final   Enterococcus Faecium NOT DETECTED NOT DETECTED Final   Listeria monocytogenes NOT DETECTED NOT DETECTED Final   Staphylococcus species NOT DETECTED NOT DETECTED Final   Staphylococcus aureus (BCID) NOT DETECTED NOT DETECTED Final   Staphylococcus epidermidis NOT DETECTED NOT DETECTED Final   Staphylococcus lugdunensis NOT DETECTED NOT DETECTED Final   Streptococcus species DETECTED (A) NOT DETECTED Final    Comment: CRITICAL RESULT CALLED TO, READ BACK BY AND VERIFIED WITH: M LILLISTON,PHARMD'@0300'  11/15/21 [place]    Streptococcus agalactiae NOT DETECTED NOT DETECTED Final   Streptococcus pneumoniae NOT DETECTED NOT DETECTED Final   Streptococcus pyogenes DETECTED (A) NOT DETECTED Final    Comment: CRITICAL RESULT CALLED TO, READ BACK BY AND VERIFIED WITH: M LILLISTON,PHARMD'@0300'  11/15/21 [place]    A.calcoaceticus-baumannii NOT DETECTED NOT DETECTED Final   Bacteroides fragilis NOT DETECTED NOT DETECTED Final   Enterobacterales NOT DETECTED NOT DETECTED Final   Enterobacter cloacae complex NOT DETECTED NOT DETECTED Final   Escherichia coli NOT DETECTED NOT DETECTED Final   Klebsiella aerogenes NOT DETECTED NOT DETECTED Final   Klebsiella oxytoca NOT DETECTED NOT DETECTED Final   Klebsiella pneumoniae NOT DETECTED NOT DETECTED Final   Proteus species NOT DETECTED NOT DETECTED Final   Salmonella species NOT DETECTED NOT DETECTED Final   Serratia marcescens NOT DETECTED NOT DETECTED Final   Haemophilus influenzae NOT DETECTED NOT DETECTED Final   Neisseria meningitidis NOT DETECTED NOT DETECTED Final   Pseudomonas aeruginosa NOT DETECTED NOT DETECTED Final   Stenotrophomonas maltophilia NOT DETECTED NOT DETECTED Final   Candida albicans NOT DETECTED NOT DETECTED Final   Candida auris NOT DETECTED NOT DETECTED Final   Candida glabrata NOT DETECTED NOT DETECTED Final   Candida krusei NOT DETECTED NOT DETECTED Final   Candida parapsilosis NOT DETECTED NOT DETECTED  Final   Candida tropicalis NOT DETECTED NOT DETECTED Final   Cryptococcus neoformans/gattii NOT DETECTED NOT DETECTED Final    Comment: Performed at [facility], [place]. [address], [place], [place] [address]  Blood Culture (routine x 2)     Status: None (Preliminary result)   Collection Time: 11/14/21  9:54 AM   Specimen: BLOOD  Result Value Ref Range Status   Specimen Description   Final    BLOOD LEFT ARM Performed at [facility] [address], [place],  [address]  Special Requests   Final    BOTTLES DRAWN AEROBIC AND ANAEROBIC Blood Culture adequate volume Performed at [facility] [address] [place], [place] [address]    Culture   Final    NO GROWTH 3 DAYS Performed at [facility], [place] [address], [place], [place] [address]    Report Status PENDING  Incomplete  Aerobic/Anaerobic Culture w Gram Stain (surgical/deep wound)     Status: None (Preliminary result)   Collection Time: 11/15/21  5:46 PM   Specimen: PATH Other; Tissue  Result Value Ref Range Status   Specimen Description   Final    WRIST RIGHT Performed at [facility], [place] [address], [place], [place] [address]    Special Requests   Final    NONE Performed at [facility], [place] [address], [place], [place] [address]    Gram Stain   Final    FEW WBC PRESENT, PREDOMINANTLY MONONUCLEAR FEW GRAM POSITIVE COCCI IN PAIRS AND CHAINS Performed at [facility], [place] [address] [place], [place] [address]    Culture   Final    MODERATE STREPTOCOCCUS PYOGENES Beta hemolytic streptococci are predictably susceptible to penicillin and other beta lactams. Susceptibility testing not routinely performed. CULTURE REINCUBATED FOR BETTER GROWTH NO ANAEROBES ISOLATED; CULTURE IN PROGRESS FOR 5 DAYS    Report Status PENDING  Incomplete  Aerobic/Anaerobic Culture w Gram Stain (surgical/deep wound)     Status: None (Preliminary  result)   Collection Time: 11/15/21  6:35 PM   Specimen: PATH Other; Tissue  Result Value Ref Range Status   Specimen Description   Final    HAND LEFT DORSAL Performed at [facility], [place] [address] [place], [place] [address]    Special Requests   Final    NONE Performed at [facility], [place] [address], [place], [place] [address]    Gram Stain   Final    MODERATE WBC PRESENT, PREDOMINANTLY MONONUCLEAR ABUNDANT GRAM POSITIVE COCCI Performed at [facility], [place] [address] [place], [place] [address]    Culture   Final    ABUNDANT STREPTOCOCCUS PYOGENES Beta hemolytic streptococci are predictably susceptible to penicillin and other beta lactams. Susceptibility testing not routinely performed. RARE STAPHYLOCOCCUS AUREUS CULTURE REINCUBATED FOR BETTER GROWTH NO ANAEROBES ISOLATED; CULTURE IN PROGRESS FOR 5 DAYS    Report Status PENDING  Incomplete  Culture, blood (Routine X 2) w Reflex to ID Panel     Status: None (Preliminary result)   Collection Time: 11/16/21  4:51 AM   Specimen: BLOOD  Result Value Ref Range Status   Specimen Description   Final    BLOOD RIGHT FOOT Performed at [facility] [address] [place], [place] [address]    Special Requests   Final    BOTTLES DRAWN AEROBIC ONLY Blood Culture adequate volume Performed at [facility] [address] [place], [place] [address]    Culture   Final    NO GROWTH < 24 HOURS Performed at [facility] [address] [place], [place] [address]    Report Status PENDING  Incomplete  Culture, blood (Routine X 2) w Reflex to ID Panel     Status: None (Preliminary result)   Collection Time: 11/16/21  4:51 AM   Specimen: BLOOD  Result Value Ref Range Status   Specimen Description   Final    BLOOD RIGHT ANKLE Performed at [facility]  [place] [address] [place], [place] [address]    Special Requests   Final     BOTTLES DRAWN AEROBIC ONLY Blood Culture adequate volume Performed at [facility] [address], [place], [place] [address]    Culture   Final    NO GROWTH < 24 HOURS Performed at [facility] [address], [place], [place] [address]    Report Status PENDING  Incomplete     Terri Piedra, [facility]  11/17/2021  2:55 PM

## 2021-11-17 NOTE — Anesthesia Preprocedure Evaluation (Addendum)
Anesthesia Evaluation  Patient identified by MRN, date of birth, ID band Patient unresponsive    Reviewed: Allergy & Precautions, NPO status , Patient's Chart, lab work & pertinent test results  Airway Mallampati: Intubated  TM Distance: >3 FB Neck ROM: Full  Mouth opening: Limited Mouth Opening  Dental  (+) Poor Dentition   Pulmonary asthma , Current Smoker and Patient abstained from smoking.,    Pulmonary exam normal breath sounds clear to auscultation       Cardiovascular Exercise Tolerance: Good negative cardio ROS   Rhythm:Regular Rate:Tachycardia  TEE 2022 1. Left ventricular ejection fraction, by estimation, is 60 to 65%. The  left ventricle has normal function. The left ventricle has no regional  wall motion abnormalities.  2. Right ventricular systolic function is normal. The right ventricular  size is normal.  3. No left atrial/left atrial appendage thrombus was detected.  4. The mitral valve is normal in structure. Trivial mitral valve  regurgitation.  5. The aortic valve is bicuspid. Aortic valve regurgitation is trivial.    Neuro/Psych negative neurological ROS  negative psych ROS   GI/Hepatic negative GI ROS, (+)     substance abuse  cocaine use, methamphetamine use and IV drug use,   Endo/Other  negative endocrine ROS  Renal/GU negative Renal ROS  negative genitourinary   Musculoskeletal negative musculoskeletal ROS (+)   Abdominal   Peds  Hematology negative hematology ROS (+) anemia ,   Anesthesia Other Findings Cellulitis of BUE (right worse than left)  Reproductive/Obstetrics                           Anesthesia Physical  Anesthesia Plan  ASA: 3  Anesthesia Plan: General   Post-op Pain Management:    Induction: Intravenous  PONV Risk Score and Plan: 1 and Ondansetron and Midazolam  Airway Management Planned: Oral ETT  Additional Equipment:    Intra-op Plan:   Post-operative Plan: Extubation in OR  Informed Consent:   Plan Discussed with: Anesthesiologist and CRNA  Anesthesia Plan Comments: (Unable to obtain consent: pt intubated and phone number in chart for brother was not correct (another person's name was on the voicemail). The surgeon claimed this was an emergency and the OR did not require a permit. )      Anesthesia Quick Evaluation

## 2021-11-17 NOTE — Transfer of Care (Signed)
Immediate Anesthesia Transfer of Care Note  Patient: Jason Buchanan  Procedure(s) Performed: ESOPHAGOGASTRODUODENOSCOPY (EGD) WITH PROPOFOL SCLEROTHERAPY BIOPSY  Patient Location: ICU  Anesthesia Type:General  Level of Consciousness: drowsy  Airway & Oxygen Therapy: Patient Spontanous Breathing and Patient connected to T-piece oxygen  Post-op Assessment: Report given to RN  Post vital signs: Reviewed and stable  Last Vitals:  Vitals Value Taken Time  BP    Temp    Pulse 107 11/17/21 1144  Resp 18 11/17/21 1151  SpO2 100 % 11/17/21 1144  Vitals shown include unvalidated device data.  Last Pain:  Vitals:   11/17/21 0958  TempSrc: Axillary  PainSc: 9       Patients Stated Pain Goal: 1 (11/16/21 2050)  Complications: No notable events documented.

## 2021-11-17 NOTE — Op Note (Signed)
[facility] Patient Name: Jason Buchanan Procedure Date: 11/17/2021 MRN: [id number] Attending MD: Clarene Essex , MD Date of Birth: 1986-11-19 CSN: [id number] Age: 35 Admit Type: Inpatient Procedure:                Upper GI endoscopy Indications:              Melena drop in hemoglobin Providers:                Clarene Essex, MD, Elmer Ramp. Tilden Dome, RN, Engineer, civil (consulting), Merchant navy officer Referring MD:              Medicines:                General Anesthesia Complications:            No immediate complications. Estimated Blood Loss:     Estimated blood loss: none. Procedure:                Pre-Anesthesia Assessment:                           - Prior to the procedure, a History and Physical                            was performed, and patient medications and                            allergies were reviewed. The patient's tolerance of                            previous anesthesia was also reviewed. The risks                            and benefits of the procedure and the sedation                            options and risks were discussed with the patient.                            All questions were answered, and informed consent                            was obtained. Prior Anticoagulants: The patient has                            taken no previous anticoagulant or antiplatelet                            agents. ASA Grade Assessment: II - A patient with                            mild systemic disease. After reviewing the risks  and benefits, the patient was deemed in                            satisfactory condition to undergo the procedure.                           After obtaining informed consent, the endoscope was                            passed under direct vision. Throughout the                            procedure, the patient's blood pressure, pulse, and                            oxygen saturations were  monitored continuously. The                            GIF-H190 ([id number]) Olympus endoscope was introduced                            through the mouth, and advanced to the third part                            of duodenum. The upper GI endoscopy was                            accomplished without difficulty. The patient                            tolerated the procedure well. Scope In: Scope Out: Findings:      Mild proximal mid esophagitis with no bleeding was found.      Few small non-bleeding superficial gastric ulcers with no stigmata of       bleeding were found in the gastric antrum.      Few non-bleeding cratered gastric ulcers 1 with adherent clot were found       in the prepyloric region of the stomach. Area was successfully injected       with 2 mL of a 1:10,000 solution of epinephrine for hemostasis.      Few non-bleeding cratered duodenal ulcers 1 with adherent clot were       found in the duodenal bulb. Area was successfully injected with 2 mL of       a 1:10,000 solution of epinephrine for hemostasis.      The second portion of the duodenum and third portion of the duodenum       were normal.      The gastric fundus and gastric antrum were normal. Biopsies were taken       with a cold forceps for histology to rule out H. pylori.      The exam was otherwise without abnormality. Impression:               - Mild proximal mid esophagitis with no bleeding.                           -  Non-bleeding gastric ulcers with no stigmata of                            bleeding.                           - Non-bleeding gastric ulcers with adherent clot.                            Injected.                           - Non-bleeding duodenal ulcers with adherent clot.                            Injected.                           - Normal second portion of the duodenum and third                            portion of the duodenum.                           - Normal gastric fundus and  antrum. Biopsied.                           - The examination was otherwise normal. Moderate Sedation:      Not Applicable - Patient had care per Anesthesia. Recommendation:           - Clear liquid diet When extubated.                           - Continue present medications. If signs of                            recurrent bleeding may need repeat endoscopy and                            possible chemo spray use or might need IR                           - Await pathology results. If H. pylori positive                            will need treatment in the future                           - Return to GI clinic PRN.                           - Telephone GI clinic if symptomatic PRN.                           - Telephone GI clinic for pathology results in 1  week. Procedure Code(s):        --- Professional ---                           [id number], 81, Esophagogastroduodenoscopy, flexible,                            transoral; with control of bleeding, any method                           43239, Esophagogastroduodenoscopy, flexible,                            transoral; with biopsy, single or multiple Diagnosis Code(s):        --- Professional ---                           K20.90, Esophagitis, unspecified without bleeding                           K25.9, Gastric ulcer, unspecified as acute or                            chronic, without hemorrhage or perforation                           K25.4, Chronic or unspecified gastric ulcer with                            hemorrhage                           K26.4, Chronic or unspecified duodenal ulcer with                            hemorrhage                           K92.1, Melena (includes Hematochezia) CPT copyright 2019 American Medical Association. All rights reserved. The codes documented in this report are preliminary and upon coder review may  be revised to meet current compliance requirements. Clarene Essex,  MD 11/17/2021 11:28:15 AM This report has been signed electronically. Number of Addenda: 0

## 2021-11-17 NOTE — Anesthesia Postprocedure Evaluation (Signed)
Anesthesia Post Note  Patient: Jason Buchanan  Procedure(s) Performed: IRRIGATION AND DEBRIDEMENT EXTREMITY (Right)     Patient location during evaluation: PACU Anesthesia Type: General Level of consciousness: sedated Pain management: pain level controlled Vital Signs Assessment: post-procedure vital signs reviewed and stable Respiratory status: spontaneous breathing and respiratory function stable Cardiovascular status: stable Postop Assessment: no apparent nausea or vomiting Anesthetic complications: no   No notable events documented.  Last Vitals:  Vitals:   11/17/21 1745 11/17/21 1800  BP: (!) 152/90 (!) 173/81  Pulse: 94 86  Resp: 17 18  Temp:  36.7 C  SpO2: 100% 100%    Last Pain:  Vitals:   11/17/21 1800  TempSrc:   PainSc: Asleep                 Orlin Kann DANIEL
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
## 2021-11-17 NOTE — Progress Notes (Signed)
Large black tarry stool at 0700am. Pt jumped out of bed to get water from the sink Pt has been reminded several times that he is npo. Pt counseled on the need to call staff before getting out of bed and the reason why he  can not have water.

## 2021-11-17 NOTE — Anesthesia Postprocedure Evaluation (Signed)
Anesthesia Post Note  Patient: TYSHEEM ACCARDO  Procedure(s) Performed: ESOPHAGOGASTRODUODENOSCOPY (EGD) WITH PROPOFOL SCLEROTHERAPY BIOPSY     Patient location during evaluation: PACU Anesthesia Type: General Level of consciousness: sedated and patient remains intubated per anesthesia plan Vital Signs Assessment: post-procedure vital signs reviewed and stable Respiratory status: patient remains intubated per anesthesia plan and patient on ventilator - see flowsheet for VS Cardiovascular status: blood pressure returned to baseline and stable Postop Assessment: no apparent nausea or vomiting Anesthetic complications: yes Comments: 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  Remained with out twitches for 35 minutes after 140 mg succinylcholine.  My be heterozygous or secondary to prolonged recumbence.      No notable events documented.  Last Vitals:  Vitals:   11/17/21 0958 11/17/21 1140  BP: (!) 185/72   Pulse: 73   Resp: 19   Temp: 36.5 C   SpO2: 100% 100%    Last Pain:  Vitals:   11/17/21 0958  TempSrc: Axillary  PainSc: 9                  Tahra Hitzeman

## 2021-11-17 NOTE — Progress Notes (Signed)
Patient transferred to endoscopy and then to ICU; patient with acute blood loss per rectum, no formed stool noted in blood.  Gave report to ICU RN and Rapid RN; endoscopy here and took patient to Endo.  Vital signs remain stable at this time.

## 2021-11-17 NOTE — Anesthesia Preprocedure Evaluation (Signed)
Anesthesia Evaluation  Patient identified by MRN, date of birth, ID band Patient awake    Reviewed: Allergy & Precautions, Patient's Chart, lab work & pertinent test results  Airway Mallampati: III  TM Distance: >3 FB Neck ROM: Full  Mouth opening: Limited Mouth Opening  Dental  (+) Poor Dentition   Pulmonary asthma , Current Smoker and Patient abstained from smoking.,    Pulmonary exam normal breath sounds clear to auscultation       Cardiovascular Exercise Tolerance: Good negative cardio ROS   Rhythm:Regular Rate:Tachycardia  TEE 2022 1. Left ventricular ejection fraction, by estimation, is 60 to 65%. The  left ventricle has normal function. The left ventricle has no regional  wall motion abnormalities.  2. Right ventricular systolic function is normal. The right ventricular  size is normal.  3. No left atrial/left atrial appendage thrombus was detected.  4. The mitral valve is normal in structure. Trivial mitral valve  regurgitation.  5. The aortic valve is bicuspid. Aortic valve regurgitation is trivial.    Neuro/Psych negative neurological ROS  negative psych ROS   GI/Hepatic negative GI ROS, (+)     substance abuse  cocaine use, methamphetamine use and IV drug use,   Endo/Other  negative endocrine ROS  Renal/GU negative Renal ROS  negative genitourinary   Musculoskeletal negative musculoskeletal ROS (+)   Abdominal   Peds  Hematology negative hematology ROS (+) anemia ,   Anesthesia Other Findings Cellulitis of BUE (right worse than left)  Reproductive/Obstetrics                             Anesthesia Physical  Anesthesia Plan  ASA: 3 and emergent  Anesthesia Plan: General   Post-op Pain Management:    Induction: Intravenous  PONV Risk Score and Plan: 1 and Ondansetron and Midazolam  Airway Management Planned: Oral ETT  Additional Equipment:   Intra-op  Plan:   Post-operative Plan: Extubation in OR and Possible Post-op intubation/ventilation  Informed Consent: I have reviewed the patients History and Physical, chart, labs and discussed the procedure including the risks, benefits and alternatives for the proposed anesthesia with the patient or authorized representative who has indicated his/her understanding and acceptance.     Dental advisory given  Plan Discussed with: CRNA, Anesthesiologist and Surgeon  Anesthesia Plan Comments: ( )        Anesthesia Quick Evaluation

## 2021-11-17 NOTE — Plan of Care (Signed)
  Problem: Education: Goal: Knowledge of General Education information will improve Description: Including pain rating scale, medication(s)/side effects and non-pharmacologic comfort measures Outcome: Progressing   Problem: Health Behavior/Discharge Planning: Goal: Ability to manage health-related needs will improve Outcome: Progressing   Problem: Clinical Measurements: Goal: Will remain free from infection Outcome: Progressing   

## 2021-11-17 NOTE — Op Note (Signed)
NAME: Jason Buchanan, GUIMOND MEDICAL RECORD NO: [id number] ACCOUNT NO: [id number] DATE OF BIRTH: 10/28/1986 FACILITY: [facility] LOCATION: [id number] PHYSICIAN: Edmundo Tedesco C. Izora Ribas, MD  Operative Report   DATE OF PROCEDURE: 11/15/2021  PREOPERATIVE DIAGNOSIS:  Severe infection of the right upper extremity.  POSTOPERATIVE DIAGNOSIS:  Severe infection of the right upper extremity.  PROCEDURES:    1.  Right hand fasciotomy. 2.  Right hand carpal tunnel release. 3.  Right hand Guyon's canal release. 4.  Right forearm 3-compartment fasciotomy. 5.  Thorough irrigation of hand and forearm. 6.  Dorsal hand incision and drainage including extensor tendon sheath.  ANESTHESIA:  General.  CULTURES: Obtained.  ESTIMATED BLOOD LOSS:  Minimal.  TOURNIQUET: Used on the upper arm.  COSURGEON:  Philipp Ovens.  INDICATIONS:  The patient is a 35 year old gentleman who presented to the emergency department with signs and symptoms of severe infection and impending compartment syndrome to the right upper extremity.  He was medically stabilized and scheduled for  operative intervention.  Consent was obtained prior to surgical intervention.  Risks, benefits and alternatives were thoroughly discussed with the patient including limb loss.  DESCRIPTION OF PROCEDURE:  The patient was taken to the operating room and placed supine on the operating room table.  A timeout was performed identifying the correct procedure.  Anesthesia was administered without difficulty.  The right upper extremity  was prepped and draped in the normal sterile fashion.  A tourniquet was used on the upper arm.  The arm was elevated and exsanguinated and the tourniquet inflated to 250 mmHg.  Incision in the mid palm was then made down to the transverse carpal  ligament.  The transverse carpal ligament was incised carefully protecting the nerve beneath. Immediately there was evidence of purulence.  This was sent for culture.  The remainder of the  transverse carpal ligament was divided under direct  visualization.  Dissection ulnarly into Guyon's canal was then performed.  The neurovascular bundle was visualized and protected.  Next, the incision was carried in a [place] type fashion across the wrist crease and a sweeping ulnar to radial incision was  made all the way up to the antecubital fossa through the subcutaneous tissue.  There was purulence within the subcutaneous tissues.  There was a large bulla just distal to the antecubital fossa, that was also released.  Next, the superficial fascia was  incised and again, there was purulence, essentially throughout the length of the forearm compartment.  Following this, a complete fasciotomy of both the superficial deep and mobile wad fascias were performed.  Individual muscle groups and muscles were  isolated while providing adequate room for irrigation and drainage.  Skin flaps were raised both radially and ulnarly, again unroofing any loculated purulent material.  This seemed to track ulnarly all the way around to the dorsal side of the forearm.   This was drained and the fascia opened there as well.  After a thorough irrigation of the hand as well as the forearm, pressure on the upper arm around the elbow ulnarly revealed additional purulent drainage and therefore, the fasciotomy was carried  cephalad above the antecubital fossa, mostly ulnarly.  Once the subcutaneous tissue and fascia was released, there did not appear to be additional purulent material that was evident.  Again, thorough irrigation with saline solution and a pulse lavage was  performed for approximately 5 liters of fluid.  Reinspection of the tissues revealed no gross purulence.  Next, the hand was turned over to the dorsal  side where preoperatively, it was felt that there were some abscesses on the dorsal hand.  Two small  incisions were made overlying the second and fourth metacarpals.  Careful dissection was carried down through  the subcutaneous tissues into the fascia.  The fascia was opened as well as the extensor tendon sheath. There did not appear to be any gross  purulence from either of these incisions.  Next, the tourniquet was released.  There was no gross bleeding.  The incisions on the dorsal wrist were approximated with one 3-0 nylon suture.  The wound in the palm was loosely approximated with 3-0 nylon  sutures.  The large skin flap was closed with a couple of staples on the volar wound of the forearm.  Xeroform, 4 x 4's, Kerlix, and Ace wraps were placed loosely over the extremity.  The patient tolerated the procedure well.  Please see separate  operative note on the left upper extremity where I served as the cosurgeon with Dr. Yehuda Budd.   MUK D: 11/16/2021 4:16:12 pm T: 11/17/2021 4:41:00 am  JOB: [id number]/ [id number]

## 2021-11-18 ENCOUNTER — Encounter (HOSPITAL_COMMUNITY): Payer: Self-pay | Admitting: General Surgery

## 2021-11-18 DIAGNOSIS — A4901 Methicillin susceptible Staphylococcus aureus infection, unspecified site: Secondary | ICD-10-CM

## 2021-11-18 LAB — GLUCOSE, CAPILLARY
Glucose-Capillary: 160 mg/dL — ABNORMAL HIGH (ref 70–99)
Glucose-Capillary: 85 mg/dL (ref 70–99)

## 2021-11-18 LAB — CBC
HCT: 23.3 % — ABNORMAL LOW (ref 39.0–52.0)
Hemoglobin: 7.9 g/dL — ABNORMAL LOW (ref 13.0–17.0)
MCH: 28.8 pg (ref 26.0–34.0)
MCHC: 33.9 g/dL (ref 30.0–36.0)
MCV: 85 fL (ref 80.0–100.0)
Platelets: 309 10*3/uL (ref 150–400)
RBC: 2.74 MIL/uL — ABNORMAL LOW (ref 4.22–5.81)
RDW: 15.2 % (ref 11.5–15.5)
WBC: 43.1 10*3/uL — ABNORMAL HIGH (ref 4.0–10.5)
nRBC: 1.5 % — ABNORMAL HIGH (ref 0.0–0.2)

## 2021-11-18 LAB — MAGNESIUM: Magnesium: 1.9 mg/dL (ref 1.7–2.4)

## 2021-11-18 LAB — TRIGLYCERIDES: Triglycerides: 189 mg/dL — ABNORMAL HIGH (ref <150)

## 2021-11-18 LAB — BASIC METABOLIC PANEL
Anion gap: 6 (ref 5–15)
BUN: 28 mg/dL — ABNORMAL HIGH (ref 6–20)
CO2: 20 mmol/L — ABNORMAL LOW (ref 22–32)
Calcium: 7 mg/dL — ABNORMAL LOW (ref 8.9–10.3)
Chloride: 105 mmol/L (ref 98–111)
Creatinine, Ser: 0.65 mg/dL (ref 0.61–1.24)
GFR, Estimated: >60 mL/min (ref >60)
Glucose, Bld: 107 mg/dL — ABNORMAL HIGH (ref 70–99)
Potassium: 4.1 mmol/L (ref 3.5–5.1)
Sodium: 131 mmol/L — ABNORMAL LOW (ref 135–145)

## 2021-11-18 LAB — SURGICAL PATHOLOGY

## 2021-11-18 LAB — CK: Total CK: 65 U/L (ref 49–397)

## 2021-11-18 MED ORDER — QUETIAPINE FUMARATE 50 MG PO TABS
50.0000 mg | ORAL_TABLET | Freq: Two times a day (BID) | ORAL | Status: DC
  Administered 2021-11-18 (×2): 50 mg via ORAL
  Filled 2021-11-18 (×2): qty 1

## 2021-11-18 MED ORDER — CHLORHEXIDINE GLUCONATE 0.12 % MT SOLN
OROMUCOSAL | Status: AC
  Administered 2021-11-18: 15 mL via OROMUCOSAL
  Filled 2021-11-18: qty 15

## 2021-11-18 MED ORDER — ENSURE ENLIVE PO LIQD
237.0000 mL | Freq: Two times a day (BID) | ORAL | Status: DC
  Administered 2021-11-19 (×2): 237 mL via ORAL
  Administered 2021-11-20: 10:00:00 100 mL via ORAL
  Administered 2021-11-20 – 2021-12-13 (×37): 237 mL via ORAL

## 2021-11-18 NOTE — Progress Notes (Signed)
[facility] for Infectious Disease  Date of Admission:  11/14/2021     Total days of antibiotics 5         ASSESSMENT:  Jason Buchanan is POD #1 from repeat I&D of the right upper extremity Surgical specimens from growing Streptococcus pyogenes and Staphylococcus aureus and have added Daptomycin for MRSA coverage. Blood cultures from 11/16/21 have been without growth to date. He does have increased aortic insufficiency on TTE and while incidence of Streptococcus pyogenes is very low it certainly cannot be excluded given the burden of infection.Thus would support TEE if pursued. Will monitor cultures for clearance of bacteremia and surgical cultures for sensitivities with narrowing of antibiotics as appropriate. Post-operative wound care per Dr. Lenon Curt and remaining medical and supportive care per primary team.   PLAN:  Continue Daptomycin and ceftriaxone.  Monitor cultures for clearance of bacteremia and surgical cultures for Staphylococcus aureus sensitivities  Post-operative care per Dr. Lenon Curt. Check Hepatitis C antibody given drug use.  Remaining medical and supportive care per primary team.   Principal Problem:   Sepsis ([place]) Active Problems:   Streptococcal bacteremia   Right arm cellulitis   Staphylococcus aureus infection    sodium chloride   Intravenous Once   acetaminophen  650 mg Oral QID   Or   acetaminophen  650 mg Rectal QID   chlorhexidine gluconate (MEDLINE KIT)  15 mL Mouth Rinse BID   Chlorhexidine Gluconate Cloth  6 each Topical Daily   folic acid  1 mg Oral Daily   multivitamin with minerals  1 tablet Oral Daily   mupirocin ointment  1 application Nasal BID   [START ON 11/20/2021] pantoprazole  40 mg Intravenous Q12H   thiamine  100 mg Oral Daily   Or   thiamine  100 mg Intravenous Daily    SUBJECTIVE:  Afebrile overnight with no acute events with repeat I&D. Having pain in both arms.   Allergies  Allergen Reactions   Bee Venom Anaphylaxis and  Swelling    Swelling all over     Review of Systems: Review of Systems  Constitutional:  Negative for chills, fever and weight loss.  Respiratory:  Negative for cough, shortness of breath and wheezing.   Cardiovascular:  Negative for chest pain and leg swelling.  Gastrointestinal:  Negative for abdominal pain, constipation, diarrhea, nausea and vomiting.  Musculoskeletal:        Pain in bilateral arms.  Skin:  Negative for rash.     OBJECTIVE: Vitals:   11/18/21 0500 11/18/21 0600 11/18/21 0800 11/18/21 0900  BP: (!) 150/59 (!) 173/76 (!) 156/58 (!) 169/75  Pulse: 99 91 98 88  Resp: (!) 24 (!) 22 (!) 21 19  Temp:   (!) 97.5 F (36.4 C)   TempSrc:   Oral   SpO2: 97% 100% 97% 100%  Weight:      Height:       Body mass index is 20.72 kg/m.  Physical Exam Constitutional:      General: He is not in acute distress.    Appearance: He is well-developed.     Comments: Lying in bed with head of bed elevated  Cardiovascular:     Rate and Rhythm: Normal rate and regular rhythm.     Heart sounds: Normal heart sounds.  Pulmonary:     Effort: Pulmonary effort is normal.     Breath sounds: Normal breath sounds.  Musculoskeletal:     Comments: Surgical dressings in place, clean and dry.  Skin:    General: Skin is warm and dry.  Neurological:     Mental Status: He is oriented to person, place, and time and easily aroused. He is lethargic.  Psychiatric:        Mood and Affect: Mood normal.    Lab Results Lab Results  Component Value Date   WBC 43.1 (H) 11/18/2021   HGB 7.9 (L) 11/18/2021   HCT 23.3 (L) 11/18/2021   MCV 85.0 11/18/2021   PLT 309 11/18/2021    Lab Results  Component Value Date   CREATININE 0.65 11/18/2021   BUN 28 (H) 11/18/2021   NA 131 (L) 11/18/2021   K 4.1 11/18/2021   CL 105 11/18/2021   CO2 20 (L) 11/18/2021    Lab Results  Component Value Date   ALT 24 11/17/2021   AST 42 (H) 11/17/2021   ALKPHOS 78 11/17/2021   BILITOT 0.6 11/17/2021      Microbiology: Recent Results (from the past 240 hour(s))  Resp Panel by RT-PCR (Flu A&B, Covid) Nasopharyngeal Swab     Status: None   Collection Time: 11/14/21  8:54 AM   Specimen: Nasopharyngeal Swab; Nasopharyngeal(NP) swabs in vial transport medium  Result Value Ref Range Status   SARS Coronavirus 2 by RT PCR NEGATIVE NEGATIVE Final    Comment: (NOTE) SARS-CoV-2 target nucleic acids are NOT DETECTED.  The SARS-CoV-2 RNA is generally detectable in upper respiratory specimens during the acute phase of infection. The lowest concentration of SARS-CoV-2 viral copies this assay can detect is 138 copies/mL. A negative result does not preclude SARS-Cov-2 infection and should not be used as the sole basis for treatment or other patient management decisions. A negative result may occur with  improper specimen collection/handling, submission of specimen other than nasopharyngeal swab, presence of viral mutation(s) within the areas targeted by this assay, and inadequate number of viral copies(<138 copies/mL). A negative result must be combined with clinical observations, patient history, and epidemiological information. The expected result is Negative.  Fact Sheet for Patients:  EntrepreneurPulse.com.au  Fact Sheet for Healthcare Providers:  IncredibleEmployment.be  This test is no t yet approved or cleared by the [place] FDA and  has been authorized for detection and/or diagnosis of SARS-CoV-2 by FDA under an Emergency Use Authorization (EUA). This EUA will remain  in effect (meaning this test can be used) for the duration of the COVID-19 declaration under Section 564(b)(1) of the Act, 21 U.S.C.section 360bbb-3(b)(1), unless the authorization is terminated  or revoked sooner.       Influenza A by PCR NEGATIVE NEGATIVE Final   Influenza B by PCR NEGATIVE NEGATIVE Final    Comment: (NOTE) The Xpert Xpress SARS-CoV-2/FLU/RSV plus assay  is intended as an aid in the diagnosis of influenza from Nasopharyngeal swab specimens and should not be used as a sole basis for treatment. Nasal washings and aspirates are unacceptable for Xpert Xpress SARS-CoV-2/FLU/RSV testing.  Fact Sheet for Patients: EntrepreneurPulse.com.au  Fact Sheet for Healthcare Providers: IncredibleEmployment.be  This test is not yet approved or cleared by the [place] FDA and has been authorized for detection and/or diagnosis of SARS-CoV-2 by FDA under an Emergency Use Authorization (EUA). This EUA will remain in effect (meaning this test can be used) for the duration of the COVID-19 declaration under Section 564(b)(1) of the Act, 21 U.S.C. section 360bbb-3(b)(1), unless the authorization is terminated or revoked.  Performed at [facility], [place] [address] [place], [place] [address]   Blood Culture (routine  x 2)     Status: Abnormal   Collection Time: 11/14/21  8:54 AM   Specimen: BLOOD  Result Value Ref Range Status   Specimen Description   Final    BLOOD LEFT ANTECUBITAL Performed at [facility] [address], [place], [place] [address]    Special Requests   Final    BOTTLES DRAWN AEROBIC AND ANAEROBIC Blood Culture results may not be optimal due to an excessive volume of blood received in culture bottles Performed at [facility] [address], [place], [place] [address]    Culture  Setup Time   Final    GRAM POSITIVE COCCI IN CHAINS ANAEROBIC BOTTLE ONLY CRITICAL RESULT CALLED TO, READ BACK BY AND VERIFIED WITH: M LILLISTON,PHARMD_0  11/15/21 [place]    Culture (A)  Final    STREPTOCOCCUS PYOGENES HEALTH DEPARTMENT NOTIFIED Performed at [facility], [place] [address], [place], [place] [address]    Report Status 11/17/2021 FINAL  Final   Organism ID, Bacteria STREPTOCOCCUS PYOGENES  Final      Susceptibility   Streptococcus pyogenes  - MIC*    PENICILLIN <=0.06 SENSITIVE Sensitive     CEFTRIAXONE <=0.12 SENSITIVE Sensitive     ERYTHROMYCIN >=8 RESISTANT Resistant     LEVOFLOXACIN <=0.25 SENSITIVE Sensitive     VANCOMYCIN 0.5 SENSITIVE Sensitive     * STREPTOCOCCUS PYOGENES  Blood Culture ID Panel (Reflexed)     Status: Abnormal   Collection Time: 11/14/21  8:54 AM  Result Value Ref Range Status   Enterococcus faecalis NOT DETECTED NOT DETECTED Final   Enterococcus Faecium NOT DETECTED NOT DETECTED Final   Listeria monocytogenes NOT DETECTED NOT DETECTED Final   Staphylococcus species NOT DETECTED NOT DETECTED Final   Staphylococcus aureus (BCID) NOT DETECTED NOT DETECTED Final   Staphylococcus epidermidis NOT DETECTED NOT DETECTED Final   Staphylococcus lugdunensis NOT DETECTED NOT DETECTED Final   Streptococcus species DETECTED (A) NOT DETECTED Final    Comment: CRITICAL RESULT CALLED TO, READ BACK BY AND VERIFIED WITH: M LILLISTON,PHARMD_1  11/15/21 [place]    Streptococcus agalactiae NOT DETECTED NOT DETECTED Final   Streptococcus pneumoniae NOT DETECTED NOT DETECTED Final   Streptococcus pyogenes DETECTED (A) NOT DETECTED Final    Comment: CRITICAL RESULT CALLED TO, READ BACK BY AND VERIFIED WITH: M LILLISTON,PHARMD_2  11/15/21 [place]    A.calcoaceticus-baumannii NOT DETECTED NOT DETECTED Final   Bacteroides fragilis NOT DETECTED NOT DETECTED Final   Enterobacterales NOT DETECTED NOT DETECTED Final   Enterobacter cloacae complex NOT DETECTED NOT DETECTED Final   Escherichia coli NOT DETECTED NOT DETECTED Final   Klebsiella aerogenes NOT DETECTED NOT DETECTED Final   Klebsiella oxytoca NOT DETECTED NOT DETECTED Final   Klebsiella pneumoniae NOT DETECTED NOT DETECTED Final   Proteus species NOT DETECTED NOT DETECTED Final   Salmonella species NOT DETECTED NOT DETECTED Final   Serratia marcescens NOT DETECTED NOT DETECTED Final   Haemophilus influenzae NOT DETECTED NOT DETECTED Final   Neisseria meningitidis  NOT DETECTED NOT DETECTED Final   Pseudomonas aeruginosa NOT DETECTED NOT DETECTED Final   Stenotrophomonas maltophilia NOT DETECTED NOT DETECTED Final   Candida albicans NOT DETECTED NOT DETECTED Final   Candida auris NOT DETECTED NOT DETECTED Final   Candida glabrata NOT DETECTED NOT DETECTED Final   Candida krusei NOT DETECTED NOT DETECTED Final   Candida parapsilosis NOT DETECTED NOT DETECTED Final   Candida tropicalis NOT DETECTED NOT DETECTED Final   Cryptococcus neoformans/gattii NOT DETECTED NOT DETECTED  Final    Comment: Performed at [facility], [place] [address], [place], [place] [address]  Blood Culture (routine x 2)     Status: None (Preliminary result)   Collection Time: 11/14/21  9:54 AM   Specimen: BLOOD  Result Value Ref Range Status   Specimen Description   Final    BLOOD LEFT ARM Performed at [facility] [address], [place], [place] [address]    Special Requests   Final    BOTTLES DRAWN AEROBIC AND ANAEROBIC Blood Culture adequate volume Performed at [facility] [address], [place], [place] [address]    Culture   Final    NO GROWTH 4 DAYS Performed at [facility], [place] [address], [place], [place] [address]    Report Status PENDING  Incomplete  Aerobic/Anaerobic Culture w Gram Stain (surgical/deep wound)     Status: None (Preliminary result)   Collection Time: 11/15/21  5:46 PM   Specimen: PATH Other; Tissue  Result Value Ref Range Status   Specimen Description   Final    WRIST RIGHT Performed at [facility], [place] [address], [place], [place] [address]    Special Requests   Final    NONE Performed at [facility], [place] [address], [place], [place] [address]    Gram Stain   Final    FEW WBC PRESENT, PREDOMINANTLY MONONUCLEAR FEW GRAM POSITIVE COCCI IN PAIRS AND CHAINS Performed at [facility], [place] [address], [place], [place] [address]    Culture    Final    MODERATE STREPTOCOCCUS PYOGENES Beta hemolytic streptococci are predictably susceptible to penicillin and other beta lactams. Susceptibility testing not routinely performed. RARE STAPHYLOCOCCUS AUREUS NO ANAEROBES ISOLATED; CULTURE IN PROGRESS FOR 5 DAYS    Report Status PENDING  Incomplete  Aerobic/Anaerobic Culture w Gram Stain (surgical/deep wound)     Status: None (Preliminary result)   Collection Time: 11/15/21  6:35 PM   Specimen: PATH Other; Tissue  Result Value Ref Range Status   Specimen Description   Final    HAND LEFT DORSAL Performed at [facility], [place] [address], [place], [place] [address]    Special Requests   Final    NONE Performed at [facility], [place] [address] [place], [place] [address]    Gram Stain   Final    MODERATE WBC PRESENT, PREDOMINANTLY MONONUCLEAR ABUNDANT GRAM POSITIVE COCCI Performed at [facility], [place] [address] [place], [place] [address]    Culture   Final    ABUNDANT STREPTOCOCCUS PYOGENES Beta hemolytic streptococci are predictably susceptible to penicillin and other beta lactams. Susceptibility testing not routinely performed. RARE STAPHYLOCOCCUS AUREUS SUSCEPTIBILITIES TO FOLLOW NO ANAEROBES ISOLATED; CULTURE IN PROGRESS FOR 5 DAYS    Report Status PENDING  Incomplete  Culture, blood (Routine X 2) w Reflex to ID Panel     Status: None (Preliminary result)   Collection Time: 11/16/21  4:51 AM   Specimen: BLOOD  Result Value Ref Range Status   Specimen Description   Final    BLOOD RIGHT FOOT Performed at [facility] [address] [place], [place] [address]    Special Requests   Final    BOTTLES DRAWN AEROBIC ONLY Blood Culture adequate volume Performed at [facility] [address] [place], [place] [address]    Culture   Final    NO GROWTH 2 DAYS  Performed at [facility], [place] [address], [place], [place] [address]    Report  Status PENDING  Incomplete  Culture, blood (Routine X 2) w Reflex to ID Panel     Status: None (Preliminary result)   Collection Time: 11/16/21  4:51 AM   Specimen: BLOOD  Result Value Ref Range Status   Specimen Description   Final    BLOOD RIGHT ANKLE Performed at [facility] [address], [place], [place] [address]    Special Requests   Final    BOTTLES DRAWN AEROBIC ONLY Blood Culture adequate volume Performed at [facility] [address], [place], [place] [address]    Culture   Final    NO GROWTH 2 DAYS Performed at [facility] [address], [place], [place] [address]    Report Status PENDING  Incomplete  MRSA Next Gen by PCR, Nasal     Status: Abnormal   Collection Time: 11/17/21 12:54 PM   Specimen: Nasal Mucosa; Nasal Swab  Result Value Ref Range Status   MRSA by PCR Next Gen DETECTED (A) NOT DETECTED Final    Comment: RESULT CALLED TO, READ BACK BY AND VERIFIED WITH: SAWYER,A. RN AT 2023 11/17/21 MULLINS,T (NOTE) The GeneXpert MRSA Assay (FDA approved for NASAL specimens only), is one component of a comprehensive MRSA colonization surveillance program. It is not intended to diagnose MRSA infection nor to guide or monitor treatment for MRSA infections. Test performance is not FDA approved in patients less than 81 years old. Performed at [facility], [place] [address] [place], [place] [address]      Terri Piedra, [facility] for Infectious Disease [facility]  11/18/2021  10:53 AM

## 2021-11-18 NOTE — Progress Notes (Signed)
Called to bedside to assess patient by RN.  Patient requesting seroquel, meds to "sedate him" so he doesn't have to hear the voices in his his head.  States he "feels like he is ready to jump up and run out of here".  Patient reports he is followed at [facility] - was reportedly on medications in prison and did well but quit taking them and used street drugs. "Now I'm all messed up".    Plan: -add seroquel  -request inpatient psychiatry to assess patient     Canary Brim, MSN, APRN, NP-C, AGACNP-BC [facility] 11/18/2021, 2:30 PM   Please see [email or web address] for pager details.   From 7A-7P if no response, please call [phone] After hours, please call ELink [phone]

## 2021-11-18 NOTE — Progress Notes (Signed)
Received call from [facility] probation services. RN spoke with Pt and received verbal consent to speak with and update Jason Buchanan, his probation officer. Ms. Toni Arthurs made aware that no concrete date of discharge was in place at this time but that it would likely be several more days. Ms. Toni Arthurs has provided 2 of her contact numbers and instructed pt to come in within 24-48hrs after his discharge. Pt provided with this information and is with the contact information at bedside.

## 2021-11-18 NOTE — Op Note (Signed)
NAME: Jason Buchanan, Jason Buchanan MEDICAL RECORD NO: [id number] ACCOUNT NO: [id number] DATE OF BIRTH: 05-Feb-1986 FACILITY: [facility] LOCATION: [id number] PHYSICIAN: [place] Langdon C. Izora Ribas, MD  Operative Report   DATE OF PROCEDURE: 11/17/2021   PREOPERATIVE DIAGNOSIS:  Status post incision and drainage and fasciotomies, extensive drainage of abscesses of both the left and right upper extremities on 11/15/2021.  POSTOPERATIVE DIAGNOSIS:  Status post incision and drainage and fasciotomies, extensive drainage of abscesses of both the left and right upper extremities on 11/15/2021.  PROCEDURE:   1.  Wound evaluation of the left upper extremity with dressing change under sedation. 2.  Incision and drainage of right upper extremity and loose closure of wound, right upper extremity.  ANESTHESIA:  General, no acute complications.  ESTIMATED BLOOD LOSS:  Minimal.  INDICATIONS:  The patient is a 35 year old gentleman presented to the emergency room a couple of days ago with severe infection to bilateral upper extremities, he was taken to the operating room for I and D.  Please see previous operative note.  He is  taken back today for reassessment of wounds and further drainage.  DESCRIPTION OF PROCEDURE:  The patient was taken to the operating room.  He was already intubated due to a prior procedure, the left upper extremity dressing was removed.  Compression both proximally to the wound and distally revealed some serous-type  fluid, but no purulent fluid, he has extensive skin slough along the dorsum of his hand and some over the proximal phalanxes of his fingers dorsally.  This was covered with Xeroform and a sterile dressing and Ace wrap were placed.  Next, the right upper  extremity was addressed.  The old dressing was removed.  The arm was prepped and draped in a sterile fashion.  There was some nonviable skin just proximal to the distal wrist crease.  This nonviable skin was sharply excised with a knife.  The skin flap   created previously was then opened.  There was no gross purulence.  Irrigation with septic tubing was then performed for 6 liters of saline solution thoroughly irrigating all the muscle groups, which had undergone fasciotomies 2 days prior.  There did  not appear to be any new areas of loculated collections proximally around the elbow dorsally, etc.  Overall, the muscle looked viable and the remaining skin with exception of a small piece mentioned earlier looked viable.  After irrigation, the  tourniquet was released.  There was no gross bleeding.  The wound was approximated loosely with staples.  Xeroform, 4 x 4's, and Kerlix were used as a sterile dressing along with an Ace wrap.  The patient will be taken back to intensive care for medical  treatment.     SUJ D: 11/17/2021 5:16:49 pm T: 11/18/2021 1:11:00 am  JOB: [id number]/ [id number]

## 2021-11-18 NOTE — Progress Notes (Addendum)
Pt agitated, requesting medication, and requesting to be sent to behavioral health d/t hearing and seeing things. RN stated that she would make the MD aware of his request. Pt using profanity in his statements directed to RN, also stated that RN had 5 minutes to get the Doctor before he got up and walked out. Pt encouraged to stay because of his extensive wounds and prognosis. Canary Brim, NP and Dr. Wynona Neat made aware and came to see patient within minutes.

## 2021-11-18 NOTE — Progress Notes (Signed)
Called to bedside  Wanting to see behavioral health Stated he was on so many medications in the past that he is now off  Hearing voices, feels tortured  I will start him on Seroquel -50 mg twice daily, gradually increase as tolerated

## 2021-11-18 NOTE — TOC Progression Note (Signed)
Transition of Care [facility]) - Progression Note    Patient Details  Name: [name] MRN: [id number] Date of Birth: [birth year]  Transition [facility]) CM/SW Contact  Golda Acre, RN Phone Number: 11/18/2021, 8:29 AM  Clinical Narrative:    Pod 1 bilateral upper extermity fasciotomies.  Transition of Care [facility]) Screening Note   Patient Details  Name: [name] Date of Birth: 08-17-1986   Transition of Care [facility]) CM/SW Contact:    Golda Acre, RN Phone Number: 11/18/2021, 8:32 AM    Transition of Care Department [facility]) has reviewed patient and no TOC needs have been identified at this time. We will continue to monitor patient advancement through interdisciplinary progression rounds. If new patient transition needs arise, please place a TOC consult.    Expected Discharge Plan: Homeless Shelter Barriers to Discharge: No Barriers Identified  Expected Discharge Plan and Services Expected Discharge Plan: Homeless Shelter       Living arrangements for the past 2 months: Homeless Shelter Expected Discharge Date: 11/14/21                                     Social Determinants of Health (SDOH) Interventions    Readmission Risk Interventions No flowsheet data found.
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
## 2021-11-18 NOTE — Progress Notes (Signed)
[facility] Gastroenterology Progress Note  Jason Buchanan 35 y.o. [birth year]  CC:  GI bleed   Subjective: Patient states he has no issues today, patient is somnolent and laying in hospital bed. Denies further episodes of melena.  ROS : Review of Systems  Gastrointestinal:  Negative for abdominal pain, blood in stool, constipation, diarrhea, heartburn, melena, nausea and vomiting.  Genitourinary:  Negative for dysuria and urgency.     Objective: Vital signs in last 24 hours: Vitals:   11/18/21 0800 11/18/21 0900  BP: (!) 156/58 (!) 169/75  Pulse: 98 88  Resp: (!) 21 19  Temp: (!) 97.5 F (36.4 C)   SpO2: 97% 100%    Physical Exam:  General:  Alert, cooperative, no distress, appears stated age  Head:  Normocephalic, without obvious abnormality, atraumatic  Eyes:  Anicteric sclera, EOM's intact, conjunctival pallor  Lungs:   Clear to auscultation bilaterally, respirations unlabored  Heart:  Mildly tachycardic  Abdomen:   Soft, non-tender, bowel sounds active all four quadrants,  no masses,     Lab Results: Recent Labs    11/17/21 0404 11/17/21 1352 11/18/21 0308  NA 132* 133* 131*  K 4.5 4.7 4.1  CL 105 106 105  CO2 21* 21* 20*  GLUCOSE 108* 118* 107*  BUN 37* 35* 28*  CREATININE 0.62 0.64 0.65  CALCIUM 7.0* 7.2* 7.0*  MG 1.6*  --  1.9   Recent Labs    11/17/21 1352  AST 42*  ALT 24  ALKPHOS 78  BILITOT 0.6  PROT 5.2*  ALBUMIN <1.5*   Recent Labs    11/17/21 1352 11/17/21 1733 11/18/21 0308  WBC 59.0*  --  43.1*  NEUTROABS 45.7*  --   --   HGB 8.5* 8.7* 7.9*  HCT 24.6* 25.4* 23.3*  MCV 83.7  --  85.0  PLT 317  --  309   Recent Labs    11/17/21 1325  LABPROT 14.6  INR 1.1      Assessment Upper GI bleed: - hgb 7.9, MCV normal - Improving leukocytosis 43.1 (59.0 yesterday) -  BUN 28 (improving from 35 yesterday) - EGD 12/15: Mild proximal mid esophagitis with no bleeding, nonbleeding gastric ulcers with no stigmata of bleeding, nonbleeding  gastric ulcers with adherent clot, injected.  Nonbleeding duodenal ulcers with adherent clot, injected.  Normal second and third portion of the duodenum. Biopsied gastric fundus and antrum.   Plan: Will follow up on pathology results Continue daily CBC and transfuse as needed to maintain HGB > 7  Continue cessation of NSAIDs Continue protonix drip Continue clear liquid diet for one more day, then can advance as tolerated. [facility] will follow  JARI DIPASQUALE PA-C 11/18/2021, 10:36 AM  Contact #  [phone]

## 2021-11-19 DIAGNOSIS — J95821 Acute postprocedural respiratory failure: Secondary | ICD-10-CM

## 2021-11-19 DIAGNOSIS — D62 Acute posthemorrhagic anemia: Secondary | ICD-10-CM

## 2021-11-19 DIAGNOSIS — F15951 Other stimulant use, unspecified with stimulant-induced psychotic disorder with hallucinations: Secondary | ICD-10-CM

## 2021-11-19 DIAGNOSIS — K922 Gastrointestinal hemorrhage, unspecified: Secondary | ICD-10-CM

## 2021-11-19 LAB — CBC
HCT: 24.5 % — ABNORMAL LOW (ref 39.0–52.0)
Hemoglobin: 8.1 g/dL — ABNORMAL LOW (ref 13.0–17.0)
MCH: 28.7 pg (ref 26.0–34.0)
MCHC: 33.1 g/dL (ref 30.0–36.0)
MCV: 86.9 fL (ref 80.0–100.0)
Platelets: 351 10*3/uL (ref 150–400)
RBC: 2.82 MIL/uL — ABNORMAL LOW (ref 4.22–5.81)
RDW: 15.3 % (ref 11.5–15.5)
WBC: 36.2 10*3/uL — ABNORMAL HIGH (ref 4.0–10.5)
nRBC: 0.3 % — ABNORMAL HIGH (ref 0.0–0.2)

## 2021-11-19 LAB — BASIC METABOLIC PANEL
Anion gap: 6 (ref 5–15)
BUN: 16 mg/dL (ref 6–20)
CO2: 21 mmol/L — ABNORMAL LOW (ref 22–32)
Calcium: 7 mg/dL — ABNORMAL LOW (ref 8.9–10.3)
Chloride: 104 mmol/L (ref 98–111)
Creatinine, Ser: 0.59 mg/dL — ABNORMAL LOW (ref 0.61–1.24)
GFR, Estimated: >60 mL/min (ref >60)
Glucose, Bld: 90 mg/dL (ref 70–99)
Potassium: 3.3 mmol/L — ABNORMAL LOW (ref 3.5–5.1)
Sodium: 131 mmol/L — ABNORMAL LOW (ref 135–145)

## 2021-11-19 LAB — CULTURE, BLOOD (ROUTINE X 2)
Culture: NO GROWTH
Special Requests: ADEQUATE

## 2021-11-19 LAB — HEPATITIS C ANTIBODY: HCV Ab: REACTIVE — AB

## 2021-11-19 LAB — MAGNESIUM: Magnesium: 1.6 mg/dL — ABNORMAL LOW (ref 1.7–2.4)

## 2021-11-19 MED ORDER — TRAZODONE HCL 100 MG PO TABS
100.0000 mg | ORAL_TABLET | Freq: Every evening | ORAL | Status: DC | PRN
  Administered 2021-11-19 – 2022-01-16 (×35): 100 mg via ORAL
  Filled 2021-11-19 (×17): qty 1
  Filled 2021-11-19: qty 2
  Filled 2021-11-19 (×22): qty 1

## 2021-11-19 MED ORDER — QUETIAPINE FUMARATE 100 MG PO TABS
100.0000 mg | ORAL_TABLET | Freq: Two times a day (BID) | ORAL | Status: DC
  Administered 2021-11-19 – 2021-12-31 (×82): 100 mg via ORAL
  Filled 2021-11-19 (×82): qty 1

## 2021-11-19 MED ORDER — OXYCODONE HCL 5 MG PO TABS
10.0000 mg | ORAL_TABLET | ORAL | Status: DC | PRN
  Administered 2021-11-19 – 2021-11-20 (×7): 10 mg via ORAL
  Filled 2021-11-19 (×7): qty 2

## 2021-11-19 MED ORDER — POTASSIUM CHLORIDE CRYS ER 20 MEQ PO TBCR
40.0000 meq | EXTENDED_RELEASE_TABLET | ORAL | Status: AC
  Administered 2021-11-19 (×2): 40 meq via ORAL
  Filled 2021-11-19 (×2): qty 2

## 2021-11-19 MED ORDER — MAGNESIUM SULFATE 2 GM/50ML IV SOLN
2.0000 g | Freq: Once | INTRAVENOUS | Status: AC
  Administered 2021-11-19: 2 g via INTRAVENOUS
  Filled 2021-11-19: qty 50

## 2021-11-19 MED ORDER — PANTOPRAZOLE SODIUM 40 MG IV SOLR
40.0000 mg | Freq: Two times a day (BID) | INTRAVENOUS | Status: DC
  Administered 2021-11-19 – 2021-11-22 (×7): 40 mg via INTRAVENOUS
  Filled 2021-11-19 (×7): qty 40

## 2021-11-19 MED ORDER — CHLORHEXIDINE GLUCONATE 0.12 % MT SOLN
OROMUCOSAL | Status: AC
  Administered 2021-11-19: 15 mL via OROMUCOSAL
  Filled 2021-11-19: qty 15

## 2021-11-19 MED ORDER — ACETAMINOPHEN 325 MG PO TABS
650.0000 mg | ORAL_TABLET | Freq: Four times a day (QID) | ORAL | Status: DC
  Administered 2021-11-19 – 2021-12-04 (×50): 650 mg via ORAL
  Filled 2021-11-19 (×51): qty 2
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
## 2021-11-19 NOTE — Progress Notes (Signed)
[facility] Gastroenterology Progress Note  Jason Buchanan 35 y.o. [birth year]  CC: GI bleed   Subjective: Patient seen and examined at bedside.  Doing better from GI standpoint.  Tolerating diet.  Denies any further bleeding.  ROS : Afebrile, negative for chest pain   Objective: Vital signs in last 24 hours: Vitals:   11/19/21 0600 11/19/21 0845  BP: (!) 142/60   Pulse: 100   Resp: (!) 27   Temp:  98.2 F (36.8 C)  SpO2: 99%     Physical Exam:  General:  Alert, cooperative, no distress, appears stated age  Head:  Normocephalic, without obvious abnormality, atraumatic  Eyes:  , EOM's intact,   Lungs:   No visible respiratory distress  Heart:  Regular rate and rhythm, S1, S2 normal  Abdomen:   Soft, non-tender, nondistended, bowel sounds present          Lab Results: Recent Labs    11/18/21 0308 11/19/21 0300  NA 131* 131*  K 4.1 3.3*  CL 105 104  CO2 20* 21*  GLUCOSE 107* 90  BUN 28* 16  CREATININE 0.65 0.59*  CALCIUM 7.0* 7.0*  MG 1.9 1.6*   Recent Labs    11/17/21 1352  AST 42*  ALT 24  ALKPHOS 78  BILITOT 0.6  PROT 5.2*  ALBUMIN <1.5*   Recent Labs    11/17/21 1352 11/17/21 1733 11/18/21 0308 11/19/21 0300  WBC 59.0*  --  43.1* 36.2*  NEUTROABS 45.7*  --   --   --   HGB 8.5*   < > 7.9* 8.1*  HCT 24.6*   < > 23.3* 24.5*  MCV 83.7  --  85.0 86.9  PLT 317  --  309 351   < > = values in this interval not displayed.   Recent Labs    11/17/21 1325  LABPROT 14.6  INR 1.1      Assessment/Plan: Upper GI bleed with EGD on November 17, 2021 showing gastric and duodenal ulcers along with esophagitis.  Biopsies negative for H. Pylori  Recommendations -------------------------- -Change Protonix drip to 40 mg IV twice daily PPI -Recommend IV twice daily PPI while in the hospital -Recommend Protonix 40 mg twice a day for 4 weeks on discharge followed by Protonix 40 mg for at least 6 months -Avoid NSAIDs.  Avoid alcohol. -Advance diet as  tolerated -No further inpatient GI work-up planned.  GI will sign off.  Call us back if needed -Management plan discussed with the patient as well as RN at bedside.   Kathi Der MD, FACP 11/19/2021, 9:27 AM  Contact #  [phone]

## 2021-11-19 NOTE — Progress Notes (Signed)
S:pt resting comfortably.  O:Blood pressure (!) 175/73, pulse (!) 110, temperature 97.7 F (36.5 C), temperature source Oral, resp. rate 19, height 5\' 7"  (1.702 m), weight 60 kg, SpO2 94 %.  Dressings changed.  R - still some drainage volarly, area of skin on volar wrist where skin is questionable.  L- some superficial skin slough fingers and dorsal hand, some drainage from both dorsal incision sites  A:s/p I&D x 2   P: wound care, dressing changes, cont medical management.  May require further I&D depending on character of drainage.  Will most likely require further skin debridement on R.

## 2021-11-19 NOTE — Progress Notes (Signed)
PROGRESS NOTE    Jason Buchanan  WRU:[id number] DOB: [birth year] DOA: 11/14/2021 PCP: [name], NP    Brief Narrative:  [name] was admitted to the hospital with the working diagnosis of severe sepsis due to right upper extremities cellulitis, complicated with streptococcus bacteremia.   35 yo male with the past medical history of polysubstance abuse who presented with bilateral arm pain. Patient injecting multiple drugs in both arms. On his initial physical examination his blood pressure was 107/88, HR 113, RR 17, oxygen saturation 100%, lungs clear to auscultation, heart S1 and S2 present and rhythmic, abdomen soft, right upper extremity with edema and erythema, bullous lesions. Left hand with erythema.   Sodium 130, potassium 3.0, chloride 91, bicarb 23, glucose 75, BUN 45, creatinine 1.19, lactic acid 3.5, white count 27.6, hemoglobin 13.9, hematocrit 41.9, platelets 321. SARS COVID-19 negative.  Urine analysis specific gravity 1.018. Toxicology screen positive for amphetamines.  Chest radiograph no infiltrates.  EKG 113 bpm, normal axis, normal intervals, sinus rhythm with poor R wave progression, positive LVH, no significant ST segment or T wave changes.  CT scan of upper extremities showed extensive cellulitis. He was placed on broad-spectrum antibiotic therapy.  12/13 I&D of bilateral upper extremities. Blood culture positive for Streptococcus group B.  12/15 patient developed melena with acute blood loss, hemoglobin down to 5.8.  Patient received 2 units packed red blood cell transfusion and was placed on intravenous pantoprazole. 12/15 upper endoscopy showing mild proximal mid esophagitis.  Nonbleeding gastric ulcers with no stigmata of bleeding.  Positive adherent clot.  Nonbleeding duodenal ulcers with adherent clot.  Ulcers were injected.  He had postprocedure respiratory failure that required invasive mechanical ventilation.  12/15, wound evaluation of the  left upper extremity with dressing change under sedation.  Incision and drainage of right upper extremity and loose closure of wound, right upper extremity.  Extubated late 12/15.  Assessment & Plan:   Principal Problem:   Sepsis due to cellulitis [facility]) Active Problems:   Polysubstance abuse (HCC)   Hypokalemia   Streptococcal bacteremia   Right arm cellulitis   Staphylococcus aureus infection   Acute blood loss anemia   Upper GI bleed   Respiratory failure, post-operative (HCC)   Bilateral upper extremities cellulitis, in the setting of IV drug abuse. Streptococcus pyogenes bacteremia. Left hand wound culture positive for staph aureus.  Sp I&D bilateral upper extremities.  Continue to have pain, not able to move upper extremities due to pain.  Tolerating po well.  Wbc is 36 from 59 on 11/17/21.  Follow up blood cultures with no growth on 12/14.   Plan to continue with antibiotic therapy with ceftriaxone and daptomycin.  Pain control with acetaminophen and oxycodone, (increase dose to 10 mg as needed for better pain control).  Avoid Non steroidal anti-inflammatory agents due to gastric and duodenal ulcers.  Continue cell count and culture monitoring.  Likely patient will not need TEE or prolonged IV antibiotic therapy. Infection seems to be predominantly due to streptococcus.   2. Upper GI bleed with acute blood loss anemia, gastric and duodenal ulcers. Patient is tolerating po well, will continue with pantoprazole bid. He is tolerating well clear liquids and his hgb is stable at 8.1  Today patient on a regular diet.  Follow up hgb in am.   3. Hyponatremia, hypokalemia and hypomagnesemia. Severe protein calorie malnutrition.  Renal function with serum cr at 0,59, K is 3,3 and Na is 131, with serum bicarbonate at  21, Mg is 1,6.  Plan to add Kcl 40 meq x2 and 2 g Mag sulfate. Follow up renal function and electrolytes in am. Advance diet to soft and consult nutrition for  evaluation.   4. Polysubstance abuse and depression. Continue neuro checks per unit protocol. Patient with no withdrawal symptoms.  Continue with seroquel.  Follow with psychiatry recommendations.   5. Post operative respiratory failure. Clinically resolved, patient with less than 24 hrs on mechanical ventilation   Patient continue to be at high risk for worsening sepsis   Status is: Inpatient  Remains inpatient appropriate because: IV antibiotic therapy   DVT prophylaxis: Enoxaparin   Code Status:    full  Family Communication:   No family at the bedside      Consultants:  ID GI Critical care   Procedures:  Bilateral upper extremities I&D   Antimicrobials:  Ceftriaxone and daptomycin     Subjective: Patient with persistent pain bilateral upper extremities, severe in intensity, worse with movement and touch. Mild improvement with analgesics. No nausea or vomiting, no abdominal pain,.   Objective: Vitals:   11/19/21 0200 11/19/21 0400 11/19/21 0500 11/19/21 0600  BP: (!) 163/53 (!) 154/60 (!) 140/55 (!) 142/60  Pulse: 84 92 98 100  Resp: (!) 22 (!) 23 (!) 21 (!) 27  Temp:  98.2 F (36.8 C)    TempSrc:  Oral    SpO2: 100% 100% 95% 99%  Weight:      Height:        Intake/Output Summary (Last 24 hours) at 11/19/2021 0819 Last data filed at 11/19/2021 0600 Gross per 24 hour  Intake 2138.33 ml  Output 5650 ml  Net -3511.67 ml   Filed Weights   11/14/21 0835 11/15/21 1608  Weight: 60 kg 60 kg    Examination:   General: Not in pain or dyspnea, deconditioned  Neurology: Somnolent but easy to arouse,  E ENT: mild pallor, no icterus, oral mucosa moist Cardiovascular: No JVD. S1-S2 present, rhythmic, no gallops, rubs, or murmurs. Bilateral upper extremity extremity edema. Pulmonary: positive breath sounds bilaterally with no wheezing, rhonchi or rales. Gastrointestinal. Abdomen soft and non tender Skin. Bilateral upper extremities wounds with dressing in  place.  Musculoskeletal: no joint deformities     Data Reviewed: I have personally reviewed following labs and imaging studies  CBC: Recent Labs  Lab 11/14/21 0854 11/15/21 0431 11/16/21 0451 11/16/21 1638 11/17/21 0404 11/17/21 1352 11/17/21 1733 11/18/21 0308 11/19/21 0300  WBC 27.6*   < > 39.1*  --  49.8* 59.0*  --  43.1* 36.2*  NEUTROABS 23.7*  --   --   --   --  45.7*  --   --   --   HGB 13.9   < > 8.1*   < > 5.8* 8.5* 8.7* 7.9* 8.1*  HCT 41.9   < > 25.4*   < > 17.1* 24.6* 25.4* 23.3* 24.5*  MCV 84.3   < > 88.5  --  85.5 83.7  --  85.0 86.9  PLT 321   < > 314  --  289 317  --  309 351   < > = values in this interval not displayed.   Basic Metabolic Panel: Recent Labs  Lab 11/14/21 1907 11/15/21 0431 11/16/21 0451 11/17/21 0404 11/17/21 1352 11/18/21 0308 11/19/21 0300  NA  --    < > 133* 132* 133* 131* 131*  K  --    < > 4.4 4.5 4.7 4.1 3.3*  CL  --    < > 100 105 106 105 104  CO2  --    < > 24 21* 21* 20* 21*  GLUCOSE  --    < > 126* 108* 118* 107* 90  BUN  --    < > 40* 37* 35* 28* 16  CREATININE  --    < > 0.62 0.62 0.64 0.65 0.59*  CALCIUM  --    < > 7.6* 7.0* 7.2* 7.0* 7.0*  MG 1.6*  --  1.6* 1.6*  --  1.9 1.6*   < > = values in this interval not displayed.   GFR: Estimated Creatinine Clearance: 109.4 mL/min (A) (by C-G formula based on SCr of 0.59 mg/dL (L)). Liver Function Tests: Recent Labs  Lab 11/14/21 0854 11/15/21 0431 11/17/21 1352  AST 41 37 42*  ALT _0 ALKPHOS 121 96 78  BILITOT 1.4* 1.4* 0.6  PROT 6.5 4.5* 5.2*  ALBUMIN 2.6* 1.6* <1.5*   No results for input(s): LIPASE, AMYLASE in the last 168 hours. No results for input(s): AMMONIA in the last 168 hours. Coagulation Profile: Recent Labs  Lab 11/14/21 0854 11/15/21 0431 11/17/21 1325  INR 1.1 1.2 1.1   Cardiac Enzymes: Recent Labs  Lab 11/14/21 0854 11/18/21 0308  CKTOTAL 76 65   BNP (last 3 results) No results for input(s): PROBNP in the last 8760  hours. HbA1C: No results for input(s): HGBA1C in the last 72 hours. CBG: Recent Labs  Lab 11/18/21 1158 11/18/21 1558  GLUCAP 160* 85   Lipid Profile: Recent Labs    11/18/21 0308  TRIG 189*   Thyroid Function Tests: No results for input(s): TSH, T4TOTAL, FREET4, T3FREE, THYROIDAB in the last 72 hours. Anemia Panel: No results for input(s): VITAMINB12, FOLATE, FERRITIN, TIBC, IRON, RETICCTPCT in the last 72 hours.    Radiology Studies: I have reviewed all of the imaging during this hospital visit personally     Scheduled Meds:  acetaminophen  650 mg Oral QID   Or   acetaminophen  650 mg Rectal QID   chlorhexidine gluconate (MEDLINE KIT)  15 mL Mouth Rinse BID   Chlorhexidine Gluconate Cloth  6 each Topical Daily   feeding supplement  237 mL Oral BID BM   folic acid  1 mg Oral Daily   multivitamin with minerals  1 tablet Oral Daily   mupirocin ointment  1 application Nasal BID   [START ON 11/20/2021] pantoprazole  40 mg Intravenous Q12H   QUEtiapine  50 mg Oral BID   thiamine  100 mg Oral Daily   Or   thiamine  100 mg Intravenous Daily   Continuous Infusions:  cefTRIAXone (ROCEPHIN)  IV Stopped (11/18/21 1751)   DAPTOmycin (CUBICIN)  IV Stopped (11/18/21 2117)   pantoprazole 8 mg/hr (11/19/21 0600)     LOS: 5 days         Gerome Apley, MD

## 2021-11-19 NOTE — Consult Note (Signed)
[place] [facility] Consult   Reason for Consult: ''Substance abuse, hearing voices, patient requesting consult.Followed at [facility].'' Referring Physician:  Sander Radon, MD Patient Identification: Jason Buchanan MRN:  [id number] Principal Diagnosis: Amphetamine and psychostimulant-induced psychosis with hallucinations [facility]) Diagnosis:  Principal Problem:   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[place]) Active Problems:   Sepsis due to cellulitis ([place])   Polysubstance abuse ([place])   Hypokalemia   Streptococcal bacteremia   Right arm cellulitis   Staphylococcus aureus infection   Acute blood loss anemia   Upper GI bleed   Respiratory failure, post-operative ([place])   Total Time spent with patient: 1 hour  Subjective:   Jason Buchanan is a 35 y.o. male patient admitted with working diagnosis of Sepsis.  HPI:  Patient is a 35 year old man with history of Polysubstance abuse who presented to the hospital with arm swelling, erythema and pain. Patient is a poor historian but  reports history of injecting multiple drugs in both arms and currently receiving treatment for severe sepsis of right upper extremities complicated with streptococcus bacteremia. Psychiatric consult was initiated yesterday after patient reported that he was hearing voices in his head and Seroquel helped him in the past. Review of lab reports revealed that patient was positive for Amphetamine on admission which in addition to sepsis may be responsible for the auditory hallucination. Today, patient is calm, cooperative and alert. He reports that adding Seroquel to his treatment order yesterday has helped him a lot. Today, he denies auditory/visual hallucinations, agitation, delusions and self harming thoughts.  Past Psychiatric History: as above  Risk to Self:  denies Risk to Others:  denies Prior Inpatient Therapy:  unknown Prior Outpatient Therapy:  [facility]  Past Medical History:  Past Medical History:  Diagnosis Date   Asthma    Mood disorder ([place])    Polysubstance abuse ([place])     Past Surgical History:  Procedure Laterality Date   ANKLE SURGERY     BIOPSY  11/17/2021   Procedure: BIOPSY;  Surgeon: Clarene Essex, MD;  Location: [facility];  Service: Endoscopy;;   BUBBLE STUDY  09/30/2021   Procedure: BUBBLE STUDY;  Surgeon: Dixie Dials, MD;  Location: [place];  Service: Cardiovascular;;   ESOPHAGOGASTRODUODENOSCOPY (EGD) WITH PROPOFOL N/A 11/17/2021   Procedure: ESOPHAGOGASTRODUODENOSCOPY (EGD) WITH PROPOFOL;  Surgeon: Clarene Essex, MD;  [facility];  Service: Endoscopy;  Laterality: N/A;   FRACTURE SURGERY     I & D EXTREMITY Right 09/27/2021   Procedure: IRRIGATION AND DEBRIDEMENT OF ABSCESS RIGHT HAND;  Surgeon: Dayna Barker, MD;  Location: [place];  Service: Plastics;  Laterality: Right;   I & D EXTREMITY Bilateral 11/15/2021   Procedure: IRRIGATION AND DEBRIDEMENT EXTREMITY;  Surgeon: Dayna Barker, MD;  [facility];  Service: Plastics;  Laterality: Bilateral;   I & D EXTREMITY Right 11/17/2021   Procedure: IRRIGATION AND DEBRIDEMENT EXTREMITY;  Surgeon: Dayna Barker, MD;  [facility];  Service: Plastics;  Laterality: Right;   INCISION AND DRAINAGE OF WOUND Left 09/27/2021   Procedure: IRRIGATION AND DEBRIDEMENT OF ABSCESS LEFT INDEX FINGER;  Surgeon: Dayna Barker, MD;  Location: [place];  Service: Plastics;  Laterality: Left;   JOINT REPLACEMENT     SCLEROTHERAPY  11/17/2021   Procedure: Clide Deutscher;  Surgeon: Clarene Essex, MD;  [facility];  Service: Endoscopy;;   TEE WITHOUT CARDIOVERSION N/A 09/30/2021   Procedure: TRANSESOPHAGEAL ECHOCARDIOGRAM (TEE);  Surgeon: Dixie Dials, MD;  Location: [facility]COPY;  Service: Cardiovascular;  Laterality: N/A;   Family History: History reviewed. No pertinent family history. Family Psychiatric  History:  Social History:  Social History    Substance and Sexual Activity  Alcohol Use Not Currently   Comment: occasionally     Social History   Substance and Sexual Activity  Drug Use Not Currently   Types: IV   Comment: heroin, meth    Social History   Socioeconomic History   Marital status: Divorced    Spouse name: Not on file   Number of children: Not on file   Years of education: Not on file   Highest education level: Not on file  Occupational History   Not on file  Tobacco Use   Smoking status: Every Day    Packs/day: 0.50    Types: Cigarettes   Smokeless tobacco: Never  Substance and Sexual Activity   Alcohol use: Not Currently    Comment: occasionally   Drug use: Not Currently    Types: IV    Comment: heroin, meth   Sexual activity: Not on file  Other Topics Concern   Not on file  Social History Narrative   Not on file   Social Determinants of Health   Financial Resource Strain: Not on file  Food Insecurity: Not on file  Transportation Needs: Not on file  Physical Activity: Not on file  Stress: Not on file  Social Connections: Not on file   Additional Social History:    Allergies:   Allergies  Allergen Reactions   Bee Venom Anaphylaxis and Swelling    Swelling all over    Labs:  Results for orders placed or performed during the hospital encounter of 11/14/21 (from the past 48 hour(s))  Blood gas, venous     Status: Abnormal   Collection Time: 11/17/21  2:37 PM  Result Value Ref Range   pH, Ven 7.436 (H) 7.250 - 7.430   pCO2, Ven 32.5 (L) 44.0 - 60.0 mmHg   pO2, Ven 62.2 (H) 32.0 - 45.0 mmHg   Bicarbonate 21.5 20.0 - 28.0 mmol/L   Acid-base deficit 1.8 0.0 - 2.0 mmol/L   O2 Saturation 91.4 %   Patient temperature 98.6     Comment: Performed at [facility], [place] [address], [place], [place] [address]  Hemoglobin and hematocrit, blood     Status: Abnormal   Collection Time: 11/17/21  5:33 PM  Result Value Ref Range   Hemoglobin 8.7 (L) 13.0 - 17.0 g/dL   HCT  25.4 (L) 39.0 - 52.0 %    Comment: Performed at [facility], [place] [address] [place], [place] [address]  Basic metabolic panel     Status: Abnormal   Collection Time: 11/18/21  3:08 AM  Result Value Ref Range   Sodium 131 (L) 135 - 145 mmol/L   Potassium 4.1 3.5 - 5.1 mmol/L   Chloride 105 98 - 111 mmol/L   CO2 20 (L) 22 - 32 mmol/L   Glucose, Bld 107 (H) 70 - 99 mg/dL    Comment: Glucose reference range applies only to samples taken after fasting for at least 8 hours.   BUN 28 (H) 6 - 20 mg/dL   Creatinine, Ser 0.65 0.61 - 1.24 mg/dL   Calcium 7.0 (L) 8.9 - 10.3 mg/dL   GFR, Estimated >60 >60 mL/min    Comment: (NOTE) Calculated using the CKD-EPI Creatinine Equation (2021)    Anion gap 6 5 - 15    Comment: Performed  at [facility], [place] [address] [place], [place] [address]  CBC     Status: Abnormal   Collection Time: 11/18/21  3:08 AM  Result Value Ref Range   WBC 43.1 (H) 4.0 - 10.5 K/uL   RBC 2.74 (L) 4.22 - 5.81 MIL/uL   Hemoglobin 7.9 (L) 13.0 - 17.0 g/dL   HCT 23.3 (L) 39.0 - 52.0 %   MCV 85.0 80.0 - 100.0 fL   MCH 28.8 26.0 - 34.0 pg   MCHC 33.9 30.0 - 36.0 g/dL   RDW 15.2 11.5 - 15.5 %   Platelets 309 150 - 400 K/uL   nRBC 1.5 (H) 0.0 - 0.2 %    Comment: Performed at [facility], [place] [address], [place], [place] [address]  Magnesium     Status: None   Collection Time: 11/18/21  3:08 AM  Result Value Ref Range   Magnesium 1.9 1.7 - 2.4 mg/dL    Comment: Performed at [facility], [place] [address], [place], [place] [address]  Triglycerides     Status: Abnormal   Collection Time: 11/18/21  3:08 AM  Result Value Ref Range   Triglycerides 189 (H) <150 mg/dL    Comment: Performed at [facility], [place] [address] [place], [place] [address]  CK     Status: None   Collection Time: 11/18/21  3:08 AM  Result Value Ref Range   Total CK 65 49 - 397 U/L    Comment: Performed at  [facility], [place] [address], [place], [place] [address]  Glucose, capillary     Status: Abnormal   Collection Time: 11/18/21 11:58 AM  Result Value Ref Range   Glucose-Capillary 160 (H) 70 - 99 mg/dL    Comment: Glucose reference range applies only to samples taken after fasting for at least 8 hours.   Comment 1 Notify RN    Comment 2 Document in Chart   Glucose, capillary     Status: None   Collection Time: 11/18/21  3:58 PM  Result Value Ref Range   Glucose-Capillary 85 70 - 99 mg/dL    Comment: Glucose reference range applies only to samples taken after fasting for at least 8 hours.   Comment 1 Notify RN    Comment 2 Document in Chart   Basic metabolic panel     Status: Abnormal   Collection Time: 11/19/21  3:00 AM  Result Value Ref Range   Sodium 131 (L) 135 - 145 mmol/L   Potassium 3.3 (L) 3.5 - 5.1 mmol/L    Comment: DELTA CHECK NOTED   Chloride 104 98 - 111 mmol/L   CO2 21 (L) 22 - 32 mmol/L   Glucose, Bld 90 70 - 99 mg/dL    Comment: Glucose reference range applies only to samples taken after fasting for at least 8 hours.   BUN 16 6 - 20 mg/dL   Creatinine, Ser 0.59 (L) 0.61 - 1.24 mg/dL   Calcium 7.0 (L) 8.9 - 10.3 mg/dL   GFR, Estimated >60 >60 mL/min    Comment: (NOTE) Calculated using the CKD-EPI Creatinine Equation (2021)    Anion gap 6 5 - 15    Comment: Performed at [facility], [place] [address] [place], [place] [address]  CBC     Status: Abnormal   Collection Time: 11/19/21  3:00 AM  Result Value Ref Range   WBC 36.2 (H) 4.0 - 10.5 K/uL   RBC  2.82 (L) 4.22 - 5.81 MIL/uL   Hemoglobin 8.1 (L) 13.0 - 17.0 g/dL   HCT 24.5 (L) 39.0 - 52.0 %   MCV 86.9 80.0 - 100.0 fL   MCH 28.7 26.0 - 34.0 pg   MCHC 33.1 30.0 - 36.0 g/dL   RDW 15.3 11.5 - 15.5 %   Platelets 351 150 - 400 K/uL   nRBC 0.3 (H) 0.0 - 0.2 %    Comment: Performed at [facility], [place] [address], [place], [place] [address]  Magnesium      Status: Abnormal   Collection Time: 11/19/21  3:00 AM  Result Value Ref Range   Magnesium 1.6 (L) 1.7 - 2.4 mg/dL    Comment: Performed at [facility], [place] [address] [place], [place] [address]    Current Facility-Administered Medications  Medication Dose Route Frequency Provider Last Rate Last Admin   acetaminophen (TYLENOL) tablet 650 mg  650 mg Oral Q6H Arrien, Jimmy Picket, MD       cefTRIAXone (ROCEPHIN) 2 g in sodium chloride 0.9 % 100 mL IVPB  2 g Intravenous Q24H Clarene Essex, MD   Stopped at 11/18/21 1751   chlorhexidine gluconate (MEDLINE KIT) (PERIDEX) 0.12 % solution 15 mL  15 mL Mouth Rinse BID Candee Furbish, MD   15 mL at 11/18/21 2048   Chlorhexidine Gluconate Cloth 2 % PADS 6 each  6 each Topical Daily Candee Furbish, MD   6 each at 11/19/21 [phone]   DAPTOmycin (CUBICIN) 500 mg in sodium chloride 0.9 % IVPB  8 mg/kg Intravenous Q[address], FNP   Stopped at 11/18/21 2117   feeding supplement (ENSURE ENLIVE / ENSURE PLUS) liquid 237 mL  237 mL Oral BID BM Ollis, Brandi L, NP   237 mL at 57/84/69 6295   folic acid (FOLVITE) tablet 1 mg  1 mg Oral Daily Clarene Essex, MD   1 mg at 11/19/21 2841   hydrALAZINE (APRESOLINE) injection 10 mg  10 mg Intravenous Q4H PRN Clarene Essex, MD   10 mg at 11/17/21 1947   ipratropium-albuterol (DUONEB) 0.5-2.5 (3) MG/3ML nebulizer solution 3 mL  3 mL Nebulization Q4H PRN Clarene Essex, MD       metoprolol tartrate (LOPRESSOR) injection 5 mg  5 mg Intravenous Q4H PRN Clarene Essex, MD   5 mg at 11/15/21 1403   multivitamin with minerals tablet 1 tablet  1 tablet Oral Daily Clarene Essex, MD   1 tablet at 11/19/21 3244   mupirocin ointment (BACTROBAN) 2 % 1 application  1 application Nasal BID Candee Furbish, MD   1 application at 12/05/70 0948   ondansetron (ZOFRAN) tablet 4 mg  4 mg Oral Q6H PRN Clarene Essex, MD       Or   ondansetron [facility]) injection 4 mg  4 mg Intravenous Q6H PRN Clarene Essex, MD   4 mg at 11/17/21  5366   oxyCODONE (Oxy IR/ROXICODONE) immediate release tablet 10 mg  10 mg Oral Q4H PRN Arrien, Jimmy Picket, MD   10 mg at 11/19/21 0950   pantoprazole (PROTONIX) injection 40 mg  40 mg Intravenous Q12H Brahmbhatt, Parag, MD   40 mg at 11/19/21 0950   QUEtiapine (SEROQUEL) tablet 100 mg  100 mg Oral BID Olalere, Adewale A, MD   100 mg at 11/19/21 4403   senna-docusate (Senokot-S) tablet 1 tablet  1 tablet Oral QHS PRN Clarene Essex, MD       thiamine tablet 100 mg  100 mg Oral Daily Clarene Essex, MD   100 mg at 11/19/21 1021   Or   thiamine (B-1) injection 100 mg  100 mg Intravenous Daily Clarene Essex, MD   100 mg at 11/18/21 1173   traZODone (DESYREL) tablet 100 mg  100 mg Oral QHS PRN Laurin Coder, MD        Musculoskeletal: Strength & Muscle Tone:  not tested Gait & Station:  not tested Patient leans: N/A   Psychiatric Specialty Exam:  Presentation  Jason Appearance: Appropriate for Environment  Eye Contact:Minimal  Speech:Slow  Speech Volume:Decreased  Handedness:Right   Mood and Affect  Mood:Dysphoric  Affect:Constricted   Thought Process  Thought Processes:Linear  Descriptions of Associations:Intact  Orientation:Full (Time, Place and Person)  Thought Content:Logical  History of Schizophrenia/Schizoaffective disorder:No data recorded Duration of Psychotic Symptoms:No data recorded Hallucinations:Hallucinations: None  Ideas of Reference:None  Suicidal Thoughts:Suicidal Thoughts: No  Homicidal Thoughts:Homicidal Thoughts: No   Sensorium  Memory:Immediate Fair; Recent Fair; Remote Fair  Judgment:Intact  Insight:Fair   Executive Functions  Concentration:Fair  Attention Span:Fair  [employer]   Psychomotor Activity  Psychomotor Activity:Psychomotor Activity: Decreased; Psychomotor Retardation   Assets  Assets:Desire for Improvement   Sleep  Sleep:Sleep: Fair   Physical  Exam: Physical Exam Review of Systems  Psychiatric/Behavioral:  Negative for hallucinations and suicidal ideas.   Blood pressure (!) 175/73, pulse (!) 110, temperature 97.7 F (36.5 C), temperature source Oral, resp. rate 19, height '5\' 7"'  (1.702 m), weight 60 kg, SpO2 94 %. Body mass index is 20.72 kg/m.   Treatment Plan Summary: 35 year old male with history of Polysubstance abuse who was admitted for the treatment of sepsis. He was referred to psychiatric consult service after he reportedly hearing voices and experiencing internal agitation. Patient has responded well to Seroquel. He now denies psychosis, delusions and self harming thoughts.   Recommendations: -Continue Seroquel 100 mg twice daily for psychosis/agitation. for depression. -Continue Trazodone 100 mg at bedtime as needed for sleep. -Patient will benefit from referral [facility] upon discharge. -May need Social worker's help for outpatient referral   Disposition: No evidence of imminent risk to self or others at present.   Patient does not meet criteria for psychiatric inpatient admission. Supportive therapy provided about ongoing stressors. Psychiatric service signing out. Re-consult as needed    Corena Pilgrim, MD 11/19/2021 2:08 PM

## 2021-11-20 LAB — BPAM RBC
Blood Product Expiration Date: 202301112359
Blood Product Expiration Date: 202301112359
Blood Product Expiration Date: 202301112359
Blood Product Expiration Date: 202301112359
ISSUE DATE / TIME: 202212142208
ISSUE DATE / TIME: 202212150550
ISSUE DATE / TIME: 202212150741
Unit Type and Rh: 6200
Unit Type and Rh: 6200
Unit Type and Rh: 6200
Unit Type and Rh: 6200

## 2021-11-20 LAB — TYPE AND SCREEN
ABO/RH(D): A POS
Antibody Screen: NEGATIVE
Unit division: 0
Unit division: 0
Unit division: 0
Unit division: 0

## 2021-11-20 LAB — CBC
HCT: 25.7 % — ABNORMAL LOW (ref 39.0–52.0)
Hemoglobin: 8.2 g/dL — ABNORMAL LOW (ref 13.0–17.0)
MCH: 28.4 pg (ref 26.0–34.0)
MCHC: 31.9 g/dL (ref 30.0–36.0)
MCV: 88.9 fL (ref 80.0–100.0)
Platelets: 426 10*3/uL — ABNORMAL HIGH (ref 150–400)
RBC: 2.89 MIL/uL — ABNORMAL LOW (ref 4.22–5.81)
RDW: 15.9 % — ABNORMAL HIGH (ref 11.5–15.5)
WBC: 28.3 10*3/uL — ABNORMAL HIGH (ref 4.0–10.5)
nRBC: 0 % (ref 0.0–0.2)

## 2021-11-20 LAB — BASIC METABOLIC PANEL
Anion gap: 5 (ref 5–15)
BUN: 13 mg/dL (ref 6–20)
CO2: 21 mmol/L — ABNORMAL LOW (ref 22–32)
Calcium: 7.2 mg/dL — ABNORMAL LOW (ref 8.9–10.3)
Chloride: 105 mmol/L (ref 98–111)
Creatinine, Ser: 0.53 mg/dL — ABNORMAL LOW (ref 0.61–1.24)
GFR, Estimated: >60 mL/min (ref >60)
Glucose, Bld: 120 mg/dL — ABNORMAL HIGH (ref 70–99)
Potassium: 3.8 mmol/L (ref 3.5–5.1)
Sodium: 131 mmol/L — ABNORMAL LOW (ref 135–145)

## 2021-11-20 LAB — MAGNESIUM: Magnesium: 2.1 mg/dL (ref 1.7–2.4)

## 2021-11-20 MED ORDER — ASCORBIC ACID 500 MG PO TABS
500.0000 mg | ORAL_TABLET | Freq: Every day | ORAL | Status: DC
  Administered 2021-11-20 – 2022-01-17 (×52): 500 mg via ORAL
  Filled 2021-11-20 (×52): qty 1

## 2021-11-20 MED ORDER — OXYCODONE HCL 5 MG PO TABS
10.0000 mg | ORAL_TABLET | ORAL | Status: DC | PRN
  Administered 2021-11-20 – 2021-12-04 (×44): 10 mg via ORAL
  Filled 2021-11-20 (×47): qty 2

## 2021-11-20 MED ORDER — CYCLOBENZAPRINE HCL 5 MG PO TABS
5.0000 mg | ORAL_TABLET | Freq: Three times a day (TID) | ORAL | Status: DC | PRN
  Administered 2021-11-20 – 2022-01-11 (×67): 5 mg via ORAL
  Filled 2021-11-20 (×69): qty 1

## 2021-11-20 MED ORDER — MORPHINE SULFATE (PF) 2 MG/ML IV SOLN
1.0000 mg | INTRAVENOUS | Status: DC | PRN
  Administered 2021-11-20 (×2): 1 mg via INTRAVENOUS
  Filled 2021-11-20 (×3): qty 1

## 2021-11-20 MED ORDER — PROSOURCE PLUS PO LIQD
30.0000 mL | Freq: Two times a day (BID) | ORAL | Status: DC
  Administered 2021-11-20 – 2021-11-21 (×2): 30 mL via ORAL
  Filled 2021-11-20 (×5): qty 30

## 2021-11-20 NOTE — Progress Notes (Signed)
PROGRESS NOTE    Jason Buchanan  ZOX:[id number] DOB: 1986-11-29 DOA: 11/14/2021 PCP: [name], NP    Brief Narrative:  [name] was admitted to the hospital with the working diagnosis of severe sepsis due to right upper extremities cellulitis, complicated with streptococcus bacteremia.    35 yo male with the past medical history of polysubstance abuse who presented with bilateral arm pain. Patient injecting multiple drugs in both arms. On his initial physical examination his blood pressure was 107/88, HR 113, RR 17, oxygen saturation 100%, lungs clear to auscultation, heart S1 and S2 present and rhythmic, abdomen soft, right upper extremity with edema and erythema, bullous lesions. Left hand with erythema.    Sodium 130, potassium 3.0, chloride 91, bicarb 23, glucose 75, BUN 45, creatinine 1.19, lactic acid 3.5, white count 27.6, hemoglobin 13.9, hematocrit 41.9, platelets 321. SARS COVID-19 negative.   Urine analysis specific gravity 1.018. Toxicology screen positive for amphetamines.   Chest radiograph no infiltrates.   EKG 113 bpm, normal axis, normal intervals, sinus rhythm with poor R wave progression, positive LVH, no significant ST segment or T wave changes.   CT scan of upper extremities showed extensive cellulitis. He was placed on broad-spectrum antibiotic therapy.   12/13 I&D of bilateral upper extremities. Blood culture positive for Streptococcus group B.   12/15 patient developed melena with acute blood loss, hemoglobin down to 5.8.  Patient received 2 units packed red blood cell transfusion and was placed on intravenous pantoprazole. 12/15 upper endoscopy showing mild proximal mid esophagitis.  Nonbleeding gastric ulcers with no stigmata of bleeding.  Positive adherent clot.  Nonbleeding duodenal ulcers with adherent clot.  Ulcers were injected.   He had postprocedure respiratory failure that required invasive mechanical ventilation.   12/15, wound evaluation  of the left upper extremity with dressing change under sedation.  Incision and drainage of right upper extremity and loose closure of wound, right upper extremity.   Extubated late 12/15.  Tolerating well antibiotic therapy and dressing changes.  May requires further I&D depending on character of drainage and most likely require further skin debridement on the right.   Assessment & Plan:   Principal Problem:   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H[facility]) Active Problems:   Sepsis due to cellulitis ([facility])   Polysubstance abuse ([facility])   Hypokalemia   Streptococcal bacteremia   Right arm cellulitis   Staphylococcus aureus infection   Acute blood loss anemia   Upper GI bleed   Respiratory failure, post-operative ([facility])   Bilateral upper extremities cellulitis, in the setting of IV drug abuse. Streptococcus pyogenes bacteremia. Left hand wound culture positive for staph aureus.  Sp I&D bilateral upper extremities.  Follow up blood cultures with no growth on 12/14.   Wbc today is 28,3, he has been afebrile.    Antibiotic therapy with ceftriaxone and daptomycin.  Continue to have uncontrolled pain Will add IV morphine for severe pain and as needed cyclobenzaprine for muscle spasms.  Continue with acetaminophen scheduled and oxycodone 10 mg as needed for moderate pain.  Continue to follow up on cell count, cultures and temperature curve. Surgery and ID recommendations    2. Upper GI bleed with acute blood loss anemia, gastric and duodenal ulcers.  No signs of active bleeding today His hgb is stable at 8,2 and he is tolerating po well Continue with pantoprazole   3. Hyponatremia, hypokalemia and hypomagnesemia. Severe protein calorie malnutrition.  Stable renal function with serum cr at 0,53, K  is   3,8 and Na 131 with serum bicarbonate at 21. Continue to encourage po intake and continue with nutritional supplements.    4. Polysubstance abuse and  depression. C On 100 mg bid of seroquel and trazodone  No signs of withdrawal. Will need outpatient follow up with psychiatry    5. Post operative respiratory failure. Clinically resolved, patient with less than 24 hrs on mechanical ventilation      Patient continue to be at high risk for worsening sepsis   Status is: Inpatient  Remains inpatient appropriate because: IV antibiotic therapy      DVT prophylaxis:  Enoxaparin   Code Status:    full  Family Communication:   No family at the bedside       Consultants:  ID \ Surgery   Procedures:  As above   Antimicrobials:   Ceftriaxone and daptomycin     Subjective: Patient continue to have bilateral upper extremity pain worse with wound care and mild improvement with oral analgesics, positive muscle spasms and decreased mobility   Objective: Vitals:   11/20/21 0100 11/20/21 0200 11/20/21 0400 11/20/21 0600  BP: (!) 144/50 (!) 163/64 (!) 169/67 (!) 164/60  Pulse: 89 95 92 97  Resp: _0 Temp:   98 F (36.7 C)   TempSrc:   Axillary   SpO2: 97% 96% 93% 98%  Weight:      Height:        Intake/Output Summary (Last 24 hours) at 11/20/2021 0736 Last data filed at 11/20/2021 0450 Gross per 24 hour  Intake 1914.54 ml  Output 4250 ml  Net -2335.46 ml   Filed Weights   11/14/21 0835 11/15/21 1608  Weight: 60 kg 60 kg    Examination:   General: deconditioned, ill looking appearing  Neurology: Awake and alert, non focal  E ENT:  positive pallor, no icterus, oral mucosa moist Cardiovascular: No JVD. S1-S2 present, rhythmic, no gallops, rubs, or murmurs. No lower extremity edema. Pulmonary: positive breath sounds bilaterally, adequate air movement, no wheezing, rhonchi or rales. Gastrointestinal. Abdomen soft and non tender Skin. Bilateral upper extremities with dressing in place.  Musculoskeletal: no joint deformities     Data Reviewed: I have personally reviewed following labs and imaging  studies  CBC: Recent Labs  Lab 11/14/21 0854 11/15/21 0431 11/17/21 0404 11/17/21 1352 11/17/21 1733 11/18/21 0308 11/19/21 0300 11/20/21 0502  WBC 27.6*   < > 49.8* 59.0*  --  43.1* 36.2* 28.3*  NEUTROABS 23.7*  --   --  45.7*  --   --   --   --   HGB 13.9   < > 5.8* 8.5* 8.7* 7.9* 8.1* 8.2*  HCT 41.9   < > 17.1* 24.6* 25.4* 23.3* 24.5* 25.7*  MCV 84.3   < > 85.5 83.7  --  85.0 86.9 88.9  PLT 321   < > 289 317  --  309 351 426*   < > = values in this interval not displayed.   Basic Metabolic Panel: Recent Labs  Lab 11/16/21 0451 11/17/21 0404 11/17/21 1352 11/18/21 0308 11/19/21 0300 11/20/21 0502  NA 133* 132* 133* 131* 131* 131*  K 4.4 4.5 4.7 4.1 3.3* 3.8  CL 100 105 106 105 104 105  CO2 24 21* 21* 20* 21* 21*  GLUCOSE 126* 108* 118* 107* 90 120*  BUN 40* 37* 35* 28* 16 13  CREATININE 0.62 0.62 0.64 0.65 0.59* 0.53*  CALCIUM 7.6* 7.0* 7.2* 7.0* 7.0*  7.2*  MG 1.6* 1.6*  --  1.9 1.6* 2.1   GFR: Estimated Creatinine Clearance: 109.4 mL/min (A) (by C-G formula based on SCr of 0.53 mg/dL (L)). Liver Function Tests: Recent Labs  Lab 11/14/21 0854 11/15/21 0431 11/17/21 1352  AST 41 37 42*  ALT _0 ALKPHOS 121 96 78  BILITOT 1.4* 1.4* 0.6  PROT 6.5 4.5* 5.2*  ALBUMIN 2.6* 1.6* <1.5*   No results for input(s): LIPASE, AMYLASE in the last 168 hours. No results for input(s): AMMONIA in the last 168 hours. Coagulation Profile: Recent Labs  Lab 11/14/21 0854 11/15/21 0431 11/17/21 1325  INR 1.1 1.2 1.1   Cardiac Enzymes: Recent Labs  Lab 11/14/21 0854 11/18/21 0308  CKTOTAL 76 65   BNP (last 3 results) No results for input(s): PROBNP in the last 8760 hours. HbA1C: No results for input(s): HGBA1C in the last 72 hours. CBG: Recent Labs  Lab 11/18/21 1158 11/18/21 1558  GLUCAP 160* 85   Lipid Profile: Recent Labs    11/18/21 0308  TRIG 189*   Thyroid Function Tests: No results for input(s): TSH, T4TOTAL, FREET4, T3FREE, THYROIDAB in  the last 72 hours. Anemia Panel: No results for input(s): VITAMINB12, FOLATE, FERRITIN, TIBC, IRON, RETICCTPCT in the last 72 hours.    Radiology Studies: I have reviewed all of the imaging during this hospital visit personally     Scheduled Meds:  acetaminophen  650 mg Oral Q6H   chlorhexidine gluconate (MEDLINE KIT)  15 mL Mouth Rinse BID   Chlorhexidine Gluconate Cloth  6 each Topical Daily   feeding supplement  237 mL Oral BID BM   folic acid  1 mg Oral Daily   multivitamin with minerals  1 tablet Oral Daily   mupirocin ointment  1 application Nasal BID   pantoprazole  40 mg Intravenous Q12H   QUEtiapine  100 mg Oral BID   thiamine  100 mg Oral Daily   Or   thiamine  100 mg Intravenous Daily   Continuous Infusions:  cefTRIAXone (ROCEPHIN)  IV Stopped (11/19/21 1804)   DAPTOmycin (CUBICIN)  IV Stopped (11/19/21 2012)     LOS: 6 days        Myquan Schaumburg Gerome Apley, MD

## 2021-11-20 NOTE — Progress Notes (Signed)
Initial Nutrition Assessment  INTERVENTION:   -Ensure Enlive po BID, each supplement provides 350 kcal and 20 grams of protein   -Prosource Plus PO BID, each provides 100 kcals and 15g protein  -500 mg Vitamin C daily  NUTRITION DIAGNOSIS:   Increased nutrient needs related to wound healing as evidenced by estimated needs.  GOAL:   Patient will meet greater than or equal to 90% of their needs  MONITOR:   PO intake, Supplement acceptance, Labs, Weight trends, I & O's, Skin  REASON FOR ASSESSMENT:   Consult Assessment of nutrition requirement/status  ASSESSMENT:   35 year old man with history of Polysubstance abuse who presented to the hospital with arm swelling, erythema and pain. Patient is a poor historian but  reports history of injecting multiple drugs in both arms and currently receiving treatment for severe sepsis of right upper extremities complicated with streptococcus bacteremia.  12/12 admitted, NPO ->HH diet 12/13: NPO, s/p I&D of left wrist deep abscess, left volar forearm fasciotomy, left wrist carpal tunnel release 12/14: Regular ->NPO 12/15: s/p right hand fasciotomy, right hand carpal tunnel release, right forearm fasciotomy, I&D of hand and forearm, s/p EGD -esophagitis, gastric and duodenal ulcers 12/17: regular diet  Patient now on regular diet. Consumed 25% of breakfast this morning and all meals yesterday. Per surgery, may need further debridement. Will monitor for plan. Will order nutritional supplements to aid in healing. Ensure supplements already ordered.  No weight loss noted in weight records. Per nursing documentation, pt with moderate BUE edema.  Medications: Folic acid, Multivitamin with minerals daily, Thiamine  Labs reviewed:  CBGs: 85-160 Low Na TG: 189 UDS+ amphetamines  NUTRITION - FOCUSED PHYSICAL EXAM:  Unable to complete -working remotely.  Diet Order:   Diet Order             Diet regular Room service appropriate? Yes;  Fluid consistency: Thin  Diet effective now                   EDUCATION NEEDS:   No education needs have been identified at this time  Skin:  Skin Assessment: Skin Integrity Issues: Skin Integrity Issues:: Incisions Incisions: 12/13: bilateral arms  12/15: right arm  Last BM:  12/15-type 6  Height:   Ht Readings from Last 1 Encounters:  11/15/21 5\' 7"  (1.702 m)    Weight:   Wt Readings from Last 1 Encounters:  11/15/21 60 kg    BMI:  Body mass index is 20.72 kg/m.  Estimated Nutritional Needs:   Kcal:  1800-2000  Protein:  95-110g  Fluid:  2L/day  11/17/21, MS, RD, LDN Inpatient Clinical Dietitian Contact information available via Amion

## 2021-11-20 NOTE — Consult Note (Signed)
WOC Nurse Consult Note: Reason for Consult: Consult requested by Surgery (Dr. Izora Ribas) for recommendations for topical dressings and change frequency. Patient is S/P I&D and fasciotomies, drainage of abscesses on bilateral upper extremities on 11/15/21. On 11/18/21, patient had I&D of right upper extremity and loose closure of wound. Consult completed remotely via review of the medical record and input from nursing staff. POC will continue to be directed by Surgery. Dr. Izora Ribas indicates that further debridement is likely to be required on right wrist in his note dated 11/19/21. Wound type: Infectious, surgical Pressure Injury POA: N/A Dressing procedure/placement/frequency: Recommend an antimicrobial nonadherent (xeroform) for the surgical sites on the upper extremities, R>L.  This can be topped with dry dressings and secured with a few turns of Kerlix roll gauze/paper tape. Changes may be daily and PRN drainage strike through or dressing dislodgement. Elevation and degree of compression (i.e., whether to use ACE bandaging) is to be determined by Provider.  Consider plastic surgery downstream for additional input to wound care.  WOC nursing team will not follow, but will remain available to this patient, the nursing and medical teams.  Please re-consult if needed. Thanks, Ladona Mow, MSN, RN, GNP, [place]  Pager# [phone]

## 2021-11-20 NOTE — Progress Notes (Signed)
Pharmacy Antibiotic Note  Jason Buchanan is a 35 y.o. male admitted on 11/14/2021 with bilateral upper extremity wounds now s/p fasciotomy and I&D also being treated for IAI.  Pharmacy has been consulted for daptomycin dosing.  Plan: Continue Daptomycin 8 mg/kg daily  Ceftriaxone per MD Will f/u renal function, culture results, clinical course, and weekly CK  Height: 5\' 7"  (170.2 cm) Weight: 60 kg (132 lb 4.4 oz) IBW/kg (Calculated) : 66.1  Temp (24hrs), Avg:98.5 F (36.9 C), Min:97.7 F (36.5 C), Max:99.4 F (37.4 C)  Recent Labs  Lab 11/14/21 0854 11/14/21 1135 11/15/21 0431 11/17/21 0404 11/17/21 1352 11/18/21 0308 11/19/21 0300 11/20/21 0502  WBC 27.6*  --    < > 49.8* 59.0* 43.1* 36.2* 28.3*  CREATININE 1.19  --    < > 0.62 0.64 0.65 0.59* 0.53*  LATICACIDVEN 3.5* 2.4*  --   --  1.1  --   --   --    < > = values in this interval not displayed.     Estimated Creatinine Clearance: 109.4 mL/min (A) (by C-G formula based on SCr of 0.53 mg/dL (L)).    Allergies  Allergen Reactions   Bee Venom Anaphylaxis and Swelling    Swelling all over    12/12 rocephin/vanc >> 12/12 12/13 clinda >> 12/13 12/13 PCN G >> 12/15 12/15 CTX> 12/15 Flagyl>>12/16 12/15 dapto >>           Weekly CK 65 (12/16)  12/12 BCx: 1/4 strep pyogenes; R EES, sens to others  12/13 left hand dorsal: abund streptococcus pyogenes, rare MRSA  12/13 R wrist: moderate streptococcus pyogenes, rare staph aureus 12/14 repeat BCx: NGTD  12/15 MRSA PCR: detected  Thank you for allowing pharmacy to be a part of this patients care.  1/16, PharmD, BCPS Clinical Pharmacist [place] Please utilize Amion for appropriate phone number to reach the unit pharmacist [facility]) 11/20/2021 9:59 AM

## 2021-11-21 ENCOUNTER — Other Ambulatory Visit (HOSPITAL_COMMUNITY): Payer: Self-pay

## 2021-11-21 DIAGNOSIS — F1910 Other psychoactive substance abuse, uncomplicated: Secondary | ICD-10-CM

## 2021-11-21 DIAGNOSIS — D62 Acute posthemorrhagic anemia: Secondary | ICD-10-CM

## 2021-11-21 DIAGNOSIS — A491 Streptococcal infection, unspecified site: Secondary | ICD-10-CM

## 2021-11-21 DIAGNOSIS — L0390 Cellulitis, unspecified: Secondary | ICD-10-CM

## 2021-11-21 DIAGNOSIS — F15951 Other stimulant use, unspecified with stimulant-induced psychotic disorder with hallucinations: Secondary | ICD-10-CM

## 2021-11-21 DIAGNOSIS — E876 Hypokalemia: Secondary | ICD-10-CM

## 2021-11-21 DIAGNOSIS — R768 Other specified abnormal immunological findings in serum: Secondary | ICD-10-CM

## 2021-11-21 LAB — BASIC METABOLIC PANEL
Anion gap: 5 (ref 5–15)
BUN: 14 mg/dL (ref 6–20)
CO2: 22 mmol/L (ref 22–32)
Calcium: 7.5 mg/dL — ABNORMAL LOW (ref 8.9–10.3)
Chloride: 102 mmol/L (ref 98–111)
Creatinine, Ser: 0.48 mg/dL — ABNORMAL LOW (ref 0.61–1.24)
GFR, Estimated: >60 mL/min (ref >60)
Glucose, Bld: 110 mg/dL — ABNORMAL HIGH (ref 70–99)
Potassium: 3.8 mmol/L (ref 3.5–5.1)
Sodium: 129 mmol/L — ABNORMAL LOW (ref 135–145)

## 2021-11-21 LAB — CBC
HCT: 25 % — ABNORMAL LOW (ref 39.0–52.0)
Hemoglobin: 8.1 g/dL — ABNORMAL LOW (ref 13.0–17.0)
MCH: 28.9 pg (ref 26.0–34.0)
MCHC: 32.4 g/dL (ref 30.0–36.0)
MCV: 89.3 fL (ref 80.0–100.0)
Platelets: 552 10*3/uL — ABNORMAL HIGH (ref 150–400)
RBC: 2.8 MIL/uL — ABNORMAL LOW (ref 4.22–5.81)
RDW: 15.9 % — ABNORMAL HIGH (ref 11.5–15.5)
WBC: 25.8 10*3/uL — ABNORMAL HIGH (ref 4.0–10.5)
nRBC: 0 % (ref 0.0–0.2)

## 2021-11-21 LAB — CULTURE, BLOOD (ROUTINE X 2)
Culture: NO GROWTH
Culture: NO GROWTH
Special Requests: ADEQUATE
Special Requests: ADEQUATE

## 2021-11-21 LAB — AEROBIC/ANAEROBIC CULTURE W GRAM STAIN (SURGICAL/DEEP WOUND)

## 2021-11-21 MED ORDER — HYDROMORPHONE HCL 1 MG/ML IJ SOLN
1.0000 mg | INTRAMUSCULAR | Status: DC | PRN
  Administered 2021-11-21 – 2021-11-26 (×39): 1 mg via INTRAVENOUS
  Filled 2021-11-21 (×40): qty 1

## 2021-11-21 MED ORDER — HYDROMORPHONE HCL 1 MG/ML IJ SOLN
INTRAMUSCULAR | Status: AC
  Filled 2021-11-21: qty 1

## 2021-11-21 MED ORDER — SODIUM CHLORIDE 0.9% FLUSH
10.0000 mL | INTRAVENOUS | Status: DC | PRN
  Administered 2021-11-26: 07:00:00 10 mL

## 2021-11-21 NOTE — Consult Note (Signed)
Reason for Consult:infection Referring Physician: ER  CC:My arm hurst so bad  HPI:  Jason Buchanan is an 35 y.o. right handed male who presents with  pain and swelling to bilateral UE's, worse on Right for several days.  Pt is IV drug user, has been injecting in both UE's.  Pt is known to me as I performed I&D several months ago.        .   Pain is rated at  10  /10 and is described as sharp.  Pain is constant.  Pain is made better by rest/immobilization, worse with motion.   Associated signs/symptoms:  bilateral Previous treatment:  I&D R dorsal hand, I&D L 2nd mcpj  Past Medical History:  Diagnosis Date   Asthma    Mood disorder (HCC)    Polysubstance abuse (HCC)     Past Surgical History:  Procedure Laterality Date   ANKLE SURGERY     BIOPSY  11/17/2021   Procedure: BIOPSY;  Surgeon: Vida Rigger, MD;  Location: [facility];  Service: Endoscopy;;   BUBBLE STUDY  09/30/2021   Procedure: BUBBLE STUDY;  Surgeon: Orpah Cobb, MD;  Location: [facility];  Service: Cardiovascular;;   ESOPHAGOGASTRODUODENOSCOPY (EGD) WITH PROPOFOL N/A 11/17/2021   Procedure: ESOPHAGOGASTRODUODENOSCOPY (EGD) WITH PROPOFOL;  Surgeon: Vida Rigger, MD;  Location: [facility];  Service: Endoscopy;  Laterality: N/A;   FRACTURE SURGERY     I & D EXTREMITY Right 09/27/2021   Procedure: IRRIGATION AND DEBRIDEMENT OF ABSCESS RIGHT HAND;  Surgeon: Knute Neu, MD;  Location: [facility] OR;  Service: Plastics;  Laterality: Right;   I & D EXTREMITY Bilateral 11/15/2021   Procedure: IRRIGATION AND DEBRIDEMENT EXTREMITY;  Surgeon: Knute Neu, MD;  Location: [facility] ORS;  Service: Plastics;  Laterality: Bilateral;   I & D EXTREMITY Right 11/17/2021   Procedure: IRRIGATION AND DEBRIDEMENT EXTREMITY;  Surgeon: Knute Neu, MD;  Location: [facility];  Service: Plastics;  Laterality: Right;   INCISION AND DRAINAGE OF WOUND Left 09/27/2021   Procedure: IRRIGATION AND DEBRIDEMENT OF ABSCESS LEFT INDEX FINGER;  Surgeon: Knute Neu, MD;  Location: [facility] OR;  Service: Plastics;  Laterality: Left;   JOINT REPLACEMENT     SCLEROTHERAPY  11/17/2021   Procedure: Susa Day;  Surgeon: Vida Rigger, MD;  Location: [facility];  Service: Endoscopy;;   TEE WITHOUT CARDIOVERSION N/A 09/30/2021   Procedure: TRANSESOPHAGEAL ECHOCARDIOGRAM (TEE);  Surgeon: Orpah Cobb, MD;  Location: [facility] END[facility]COPY;  Service: Cardiovascular;  Laterality: N/A;    History reviewed. No pertinent family history.  Social History:  reports that he has been smoking cigarettes. He has been smoking an average of .5 packs per day. He has never used smokeless tobacco. He reports that he does not currently use alcohol. He reports that he does not currently use drugs after having used the following drugs: IV.  Allergies:  Allergies  Allergen Reactions   Bee Venom Anaphylaxis and Swelling    Swelling all over    Medications: I have reviewed the patient's current medications. Prior to Admission:  Medications Prior to Admission  Medication Sig Dispense Refill Last Dose   Amniotic Membrane Allograft (NUSHIELD) 4 CM X 4 CM SHEE Apply 1 each topically in the morning, at noon, and at bedtime. 180 each 0    ibuprofen (ADVIL) 200 MG tablet Take 400 mg by mouth every 6 (six) hours as needed for headache or moderate pain.      NAPROSYN 500 MG tablet Take 500 mg by mouth 2 (two) times daily  as needed.      Sodium Chloride Flush (SALINE FLUSH) 0.9 % SOLN Inject 1 each into the vein in the morning, at noon, and at bedtime. 90 mL 0    ZYRTEC ALLERGY 10 MG tablet Take 10 mg by mouth at bedtime as needed.       Results for orders placed or performed during the hospital encounter of 11/14/21 (from the past 48 hour(s))  Basic metabolic panel     Status: Abnormal   Collection Time: 11/20/21  5:02 AM  Result Value Ref Range   Sodium 131 (L) 135 - 145 mmol/L   Potassium 3.8 3.5 - 5.1 mmol/L   Chloride 105 98 - 111 mmol/L   CO2 21 (L) 22 - 32 mmol/L   Glucose,  Bld 120 (H) 70 - 99 mg/dL    Comment: Glucose reference range applies only to samples taken after fasting for at least 8 hours.   BUN 13 6 - 20 mg/dL   Creatinine, Ser 4.81 (L) 0.61 - 1.24 mg/dL   Calcium 7.2 (L) 8.9 - 10.3 mg/dL   GFR, Estimated >85 >63 mL/min    Comment: (NOTE) Calculated using the CKD-EPI Creatinine Equation (2021)    Anion gap 5 5 - 15    Comment: Performed at [facility], [address], [place], [place] [address]  CBC     Status: Abnormal   Collection Time: 11/20/21  5:02 AM  Result Value Ref Range   WBC 28.3 (H) 4.0 - 10.5 K/uL   RBC 2.89 (L) 4.22 - 5.81 MIL/uL   Hemoglobin 8.2 (L) 13.0 - 17.0 g/dL   HCT 26.3 (L) 78.5 - 88.5 %   MCV 88.9 80.0 - 100.0 fL   MCH 28.4 26.0 - 34.0 pg   MCHC 31.9 30.0 - 36.0 g/dL   RDW 02.7 (H) 74.1 - 28.7 %   Platelets 426 (H) 150 - 400 K/uL   nRBC 0.0 0.0 - 0.2 %    Comment: Performed at [facility], [address] [place], [place] [address]  Magnesium     Status: None   Collection Time: 11/20/21  5:02 AM  Result Value Ref Range   Magnesium 2.1 1.7 - 2.4 mg/dL    Comment: Performed at [facility], [address] [place], [place] [address]  Basic metabolic panel     Status: Abnormal   Collection Time: 11/21/21  4:48 AM  Result Value Ref Range   Sodium 129 (L) 135 - 145 mmol/L   Potassium 3.8 3.5 - 5.1 mmol/L   Chloride 102 98 - 111 mmol/L   CO2 22 22 - 32 mmol/L   Glucose, Bld 110 (H) 70 - 99 mg/dL    Comment: Glucose reference range applies only to samples taken after fasting for at least 8 hours.   BUN 14 6 - 20 mg/dL   Creatinine, Ser 0.96 (L) 0.61 - 1.24 mg/dL   Calcium 7.5 (L) 8.9 - 10.3 mg/dL   GFR, Estimated >28 >36 mL/min    Comment: (NOTE) Calculated using the CKD-EPI Creatinine Equation (2021)    Anion gap 5 5 - 15    Comment: Performed at [facility], [address] [place], [place] [address]  CBC     Status: Abnormal    Collection Time: 11/21/21  4:48 AM  Result Value Ref Range   WBC 25.8 (H) 4.0 - 10.5 K/uL   RBC 2.80 (L) 4.22 - 5.81 MIL/uL  Hemoglobin 8.1 (L) 13.0 - 17.0 g/dL   HCT 55.3 (L) 74.8 - 27.0 %   MCV 89.3 80.0 - 100.0 fL   MCH 28.9 26.0 - 34.0 pg   MCHC 32.4 30.0 - 36.0 g/dL   RDW 78.6 (H) 75.4 - 49.2 %   Platelets 552 (H) 150 - 400 K/uL   nRBC 0.0 0.0 - 0.2 %    Comment: Performed at [facility], [address] [place], [place] [address]    No results found.  Pertinent items are noted in HPI. Temp:  [97.3 F (36.3 C)-99.5 F (37.5 C)] 98.4 F (36.9 C) (12/19 1033) Pulse Rate:  [91-118] 102 (12/19 1033) Resp:  [16-22] 16 (12/19 1033) BP: (121-168)/(39-73) 142/60 (12/19 1033) SpO2:  [97 %-100 %] 100 % (12/19 1033) General appearance: alert, cooperative, appears older than stated age, and mild distress Resp: clear to auscultation bilaterally Cardio: regular rate and rhythm Extremities: RUE: intense swelling and erythema of entire hand, arm to upper arm, superficial blister formation along forearm, volar hand/wrist blister formation with skin compromise, fingers pink, limited movement of wrist, fingers due to pain and swelling. LUE, palm and dorsam  hand and distal forearm swelling, erythema.   Assessment: Severe infetion of bilateral UE's R>L; Sepsis Plan: Attempted to transfer pt to a another facility, for higher level of care, unsuccessful!  Pt will need extensive I&D, medical management for sepsis. I have discussed this treatment plan in detail with patient, including the risks of the recommended treatment and surgery, the benefits and the alternatives, including limb loss and death.  The patient understands this and that additional treatment may be necessary.  Kohei Antonellis C Elisha Mcgruder 11/21/2021, 11:59 AM

## 2021-11-21 NOTE — TOC Initial Note (Signed)
Transition of Care [facility]) - Initial/Assessment Note    Patient Details  Name: Jason Buchanan MRN: [id number] Date of Birth: 11/23/1986  Transition of Care [facility]) CM/SW Contact:    Trish Mage, LCSW Phone Number: 11/21/2021, 3:46 PM  Clinical Narrative:  Patient seen in follow up to PT recommendation of SNF.  Jason Buchanan readily admits to the use of drugs, says he needs to go into long term rehab, but the program he was checking out does not work with folks who have wounds. He is hopeful that he could get into short term rehab, regain use of his hands and get his wounds to the point that he can care for them himself, and then get into drug rehab.  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 He has no family supports. Bed search process explained and initiated. TOC will continue to follow during the course of hospitalization.                 Expected Discharge Plan: Skilled Nursing Facility Barriers to Discharge: SNF Pending bed offer   Patient Goals and CMS Choice Patient states their goals for this hospitalization and ongoing recovery are:: to get better CMS Medicare.gov Compare Post Acute Care list provided to:: Patient Choice offered to / list presented to : Patient  Expected Discharge Plan and Services Expected Discharge Plan: [place]   Discharge Planning Services: CM Consult   Living arrangements for the past 2 months: Homeless Shelter Expected Discharge Date: 11/14/21                                    Prior Living Arrangements/Services Living arrangements for the past 2 months: [place] with:: Facility Resident Patient language and need for interpreter reviewed:: Yes Do you feel safe going back to the place where you live?: Yes      Need for Family Participation in Patient Care: Yes (Comment) Care giver support system in place?: No (comment)   Criminal  Activity/Legal Involvement Pertinent to Current Situation/Hospitalization: No - Comment as needed  Activities of Daily Living Home Assistive Devices/Equipment: None ADL Screening (condition at time of admission) Patient's cognitive ability adequate to safely complete daily activities?: Yes Is the patient deaf or have difficulty hearing?: No Does the patient have difficulty seeing, even when wearing glasses/contacts?: No Does the patient have difficulty concentrating, remembering, or making decisions?: No Patient able to express need for assistance with ADLs?: Yes Does the patient have difficulty dressing or bathing?: Yes Independently performs ADLs?: No Communication: Independent Dressing (OT): Needs assistance Is this a change from baseline?: Change from baseline, expected to last >3 days Grooming: Needs assistance Is this a change from baseline?: Change from baseline, expected to last >3 days Feeding: Needs assistance Is this a change from baseline?: Change from baseline, expected to last >3 days Bathing: Needs assistance Is this a change from baseline?: Change from baseline, expected to last >3 days Toileting: Needs assistance Is this a change from baseline?: Change from baseline, expected to last >3days In/Out Bed: Needs assistance Is this a change from baseline?: Change from baseline, expected to last >3 days Walks in Home: Independent Does the patient have difficulty walking or climbing stairs?: Yes Weakness of Legs: Both Weakness of Arms/Hands: Both  Permission Sought/Granted  Emotional Assessment Appearance:: Appears stated age Attitude/Demeanor/Rapport: Engaged Affect (typically observed): Appropriate Orientation: : Oriented to Self, Oriented to Place, Oriented to Situation, Oriented to  Time Alcohol / Substance Use: Illicit Drugs Psych Involvement: No (comment)  Admission diagnosis:  Right arm cellulitis [L03.113] Sepsis (HCC) [A41.9] Patient  Active Problem List   Diagnosis Date Noted   Acute blood loss anemia 11/19/2021   Upper GI bleed 11/19/2021   Respiratory failure, post-operative (HCC) 11/19/2021   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 [place] [address] Phone: [phone] Fax: [phone]  [facility] [address] [place] [address] Phone: [phone] Fax: [phone]     Social Determinants of Health (SDOH) Interventions    Readmission Risk Interventions No flowsheet data found.

## 2021-11-21 NOTE — Progress Notes (Signed)
Dsg changes done to BUE. Dionne Bucy RN

## 2021-11-21 NOTE — Progress Notes (Signed)
Subjective: No new complaints   Antibiotics:  Anti-infectives (From admission, onward)    Start     Dose/Rate Route Frequency Ordered Stop   11/17/21 1700  cefTRIAXone (ROCEPHIN) 2 g in sodium chloride 0.9 % 100 mL IVPB  Status:  Discontinued        2 g 200 mL/hr over 30 Minutes Intravenous Every 24 hours 11/17/21 1019 11/21/21 1047   11/17/21 1700  DAPTOmycin (CUBICIN) 500 mg in sodium chloride 0.9 % IVPB        8 mg/kg  60 kg 120 mL/hr over 30 Minutes Intravenous Daily 11/17/21 1555     11/17/21 1200  metroNIDAZOLE (FLAGYL) IVPB 500 mg  Status:  Discontinued        500 mg 100 mL/hr over 60 Minutes Intravenous Every 12 hours 11/17/21 1019 11/18/21 0924   11/15/21 1000  cefTRIAXone (ROCEPHIN) 2 g in sodium chloride 0.9 % 100 mL IVPB  Status:  Discontinued        2 g 200 mL/hr over 30 Minutes Intravenous Every 24 hours 11/14/21 1834 11/15/21 0405   11/15/21 1000  clindamycin (CLEOCIN) IVPB 600 mg  Status:  Discontinued        600 mg 100 mL/hr over 30 Minutes Intravenous Every 8 hours 11/15/21 0405 11/15/21 1523   11/15/21 0800  penicillin G potassium 12 Million Units in dextrose 5 % 500 mL continuous infusion  Status:  Discontinued        12 Million Units 41.7 mL/hr over 12 Hours Intravenous Every 12 hours 11/15/21 0405 11/17/21 1956   11/14/21 2200  vancomycin (VANCOREADY) IVPB 750 mg/150 mL  Status:  Discontinued        750 mg 150 mL/hr over 60 Minutes Intravenous Every 12 hours 11/14/21 1524 11/15/21 0405   11/14/21 0900  vancomycin (VANCOCIN) IVPB 1000 mg/200 mL premix        1,000 mg 200 mL/hr over 60 Minutes Intravenous  Once 11/14/21 0849 11/14/21 1043   11/14/21 0900  cefTRIAXone (ROCEPHIN) 2 g in sodium chloride 0.9 % 100 mL IVPB        2 g 200 mL/hr over 30 Minutes Intravenous  Once 11/14/21 0849 11/14/21 0935       Medications: Scheduled Meds:  (feeding supplement) PROSource Plus  30 mL Oral BID BM   acetaminophen  650 mg Oral Q6H   vitamin C  500  mg Oral Daily   Chlorhexidine Gluconate Cloth  6 each Topical Daily   feeding supplement  237 mL Oral BID BM   folic acid  1 mg Oral Daily   multivitamin with minerals  1 tablet Oral Daily   mupirocin ointment  1 application Nasal BID   pantoprazole  40 mg Intravenous Q12H   QUEtiapine  100 mg Oral BID   thiamine  100 mg Oral Daily   Continuous Infusions:  DAPTOmycin (CUBICIN)  IV Stopped (11/20/21 2106)   PRN Meds:.cyclobenzaprine, hydrALAZINE, ipratropium-albuterol, metoprolol tartrate, morphine injection, ondansetron **OR** ondansetron (ZOFRAN) IV, oxyCODONE, senna-docusate, traZODone    Objective: Weight change:   Intake/Output Summary (Last 24 hours) at 11/21/2021 1526 Last data filed at 11/21/2021 1409 Gross per 24 hour  Intake 880 ml  Output 4580 ml  Net -3700 ml   Blood pressure (!) 142/64, pulse (!) 103, temperature 98.2 F (36.8 C), temperature source Oral, resp. rate 18, height 5\' 7"  (1.702 m), weight 60 kg, SpO2 100 %. Temp:  [97.3 F (36.3 C)-99.5 F (37.5 C)] 98.2  F (36.8 C) (12/19 1430) Pulse Rate:  [91-118] 103 (12/19 1430) Resp:  [16-22] 18 (12/19 1430) BP: (133-168)/(39-73) 142/64 (12/19 1430) SpO2:  [97 %-100 %] 100 % (12/19 1430)  Physical Exam: Physical Exam Vitals reviewed.  Constitutional:      Appearance: He is cachectic. He is ill-appearing.  HENT:     Head: Normocephalic and atraumatic.  Eyes:     General:        Right eye: No discharge.        Left eye: No discharge.  Cardiovascular:     Rate and Rhythm: Tachycardia present.  Pulmonary:     Effort: Pulmonary effort is normal. No respiratory distress.     Breath sounds: No wheezing.  Abdominal:     General: There is no distension.  Neurological:     General: No focal deficit present.     Mental Status: He is alert.  Psychiatric:        Attention and Perception: Attention normal.        Mood and Affect: Mood is depressed.        Speech: Speech normal.        Behavior: Behavior  normal. Behavior is cooperative.        Thought Content: Thought content normal.        Cognition and Memory: Cognition and memory normal.    Bilateral UE bandaged  CBC:    BMET Recent Labs    11/20/21 0502 11/21/21 0448  NA 131* 129*  K 3.8 3.8  CL 105 102  CO2 21* 22  GLUCOSE 120* 110*  BUN 13 14  CREATININE 0.53* 0.48*  CALCIUM 7.2* 7.5*     Liver Panel  No results for input(s): PROT, ALBUMIN, AST, ALT, ALKPHOS, BILITOT, BILIDIR, IBILI in the last 72 hours.     Sedimentation Rate No results for input(s): ESRSEDRATE in the last 72 hours. C-Reactive Protein No results for input(s): CRP in the last 72 hours.  Micro Results: Recent Results (from the past 720 hour(s))  Resp Panel by RT-PCR (Flu A&B, Covid) Nasopharyngeal Swab     Status: None   Collection Time: 11/14/21  8:54 AM   Specimen: Nasopharyngeal Swab; Nasopharyngeal(NP) swabs in vial transport medium  Result Value Ref Range Status   SARS Coronavirus 2 by RT PCR NEGATIVE NEGATIVE Final    Comment: (NOTE) SARS-CoV-2 target nucleic acids are NOT DETECTED.  The SARS-CoV-2 RNA is generally detectable in upper respiratory specimens during the acute phase of infection. The lowest concentration of SARS-CoV-2 viral copies this assay can detect is 138 copies/mL. A negative result does not preclude SARS-Cov-2 infection and should not be used as the sole basis for treatment or other patient management decisions. A negative result may occur with  improper specimen collection/handling, submission of specimen other than nasopharyngeal swab, presence of viral mutation(s) within the areas targeted by this assay, and inadequate number of viral copies(<138 copies/mL). A negative result must be combined with clinical observations, patient history, and epidemiological information. The expected result is Negative.  Fact Sheet for Patients:  EntrepreneurPulse.com.au  Fact Sheet for Healthcare  Providers:  IncredibleEmployment.be  This test is no t yet approved or cleared by the [place] FDA and  has been authorized for detection and/or diagnosis of SARS-CoV-2 by FDA under an Emergency Use Authorization (EUA). This EUA will remain  in effect (meaning this test can be used) for the duration of the COVID-19 declaration under Section 564(b)(1) of the Act, 21 U.S.C.section 360bbb-3(b)(1),  unless the authorization is terminated  or revoked sooner.       Influenza A by PCR NEGATIVE NEGATIVE Final   Influenza B by PCR NEGATIVE NEGATIVE Final    Comment: (NOTE) The Xpert Xpress SARS-CoV-2/FLU/RSV plus assay is intended as an aid in the diagnosis of influenza from Nasopharyngeal swab specimens and should not be used as a sole basis for treatment. Nasal washings and aspirates are unacceptable for Xpert Xpress SARS-CoV-2/FLU/RSV testing.  Fact Sheet for Patients: EntrepreneurPulse.com.au  Fact Sheet for Healthcare Providers: IncredibleEmployment.be  This test is not yet approved or cleared by the [place] FDA and has been authorized for detection and/or diagnosis of SARS-CoV-2 by FDA under an Emergency Use Authorization (EUA). This EUA will remain in effect (meaning this test can be used) for the duration of the COVID-19 declaration under Section 564(b)(1) of the Act, 21 U.S.C. section 360bbb-3(b)(1), unless the authorization is terminated or revoked.  Performed at [facility], [place] [address], [place], [place] [address]   Blood Culture (routine x 2)     Status: Abnormal   Collection Time: 11/14/21  8:54 AM   Specimen: BLOOD  Result Value Ref Range Status   Specimen Description   Final    BLOOD LEFT ANTECUBITAL Performed at [place] [address], [place], [place] [address]    Special Requests   Final    BOTTLES DRAWN AEROBIC AND ANAEROBIC Blood Culture results may  not be optimal due to an excessive volume of blood received in culture bottles Performed at [facility] [address], [place], [place] [address]    Culture  Setup Time   Final    GRAM POSITIVE COCCI IN CHAINS ANAEROBIC BOTTLE ONLY CRITICAL RESULT CALLED TO, READ BACK BY AND VERIFIED WITH: M LILLISTON,PHARMD@0300  11/15/21 [place]    Culture (A)  Final    STREPTOCOCCUS PYOGENES HEALTH DEPARTMENT NOTIFIED Performed at [facility], [place] [address] [place],  [address]    Report Status 11/17/2021 FINAL  Final   Organism ID, Bacteria STREPTOCOCCUS PYOGENES  Final      Susceptibility   Streptococcus pyogenes - MIC*    PENICILLIN <=0.06 SENSITIVE Sensitive     CEFTRIAXONE <=0.12 SENSITIVE Sensitive     ERYTHROMYCIN >=8 RESISTANT Resistant     LEVOFLOXACIN <=0.25 SENSITIVE Sensitive     VANCOMYCIN 0.5 SENSITIVE Sensitive     * STREPTOCOCCUS PYOGENES  Blood Culture ID Panel (Reflexed)     Status: Abnormal   Collection Time: 11/14/21  8:54 AM  Result Value Ref Range Status   Enterococcus faecalis NOT DETECTED NOT DETECTED Final   Enterococcus Faecium NOT DETECTED NOT DETECTED Final   Listeria monocytogenes NOT DETECTED NOT DETECTED Final   Staphylococcus species NOT DETECTED NOT DETECTED Final   Staphylococcus aureus (BCID) NOT DETECTED NOT DETECTED Final   Staphylococcus epidermidis NOT DETECTED NOT DETECTED Final   Staphylococcus lugdunensis NOT DETECTED NOT DETECTED Final   Streptococcus species DETECTED (A) NOT DETECTED Final    Comment: CRITICAL RESULT CALLED TO, READ BACK BY AND VERIFIED WITH: M LILLISTON,PHARMD@0300  11/15/21 [place]    Streptococcus agalactiae NOT DETECTED NOT DETECTED Final   Streptococcus pneumoniae NOT DETECTED NOT DETECTED Final   Streptococcus pyogenes DETECTED (A) NOT DETECTED Final    Comment: CRITICAL RESULT CALLED TO, READ BACK BY AND VERIFIED WITH: M LILLISTON,PHARMD@0300  11/15/21 [place]    A.calcoaceticus-baumannii NOT DETECTED  NOT DETECTED Final   Bacteroides fragilis NOT DETECTED NOT DETECTED Final  Enterobacterales NOT DETECTED NOT DETECTED Final   Enterobacter cloacae complex NOT DETECTED NOT DETECTED Final   Escherichia coli NOT DETECTED NOT DETECTED Final   Klebsiella aerogenes NOT DETECTED NOT DETECTED Final   Klebsiella oxytoca NOT DETECTED NOT DETECTED Final   Klebsiella pneumoniae NOT DETECTED NOT DETECTED Final   Proteus species NOT DETECTED NOT DETECTED Final   Salmonella species NOT DETECTED NOT DETECTED Final   Serratia marcescens NOT DETECTED NOT DETECTED Final   Haemophilus influenzae NOT DETECTED NOT DETECTED Final   Neisseria meningitidis NOT DETECTED NOT DETECTED Final   Pseudomonas aeruginosa NOT DETECTED NOT DETECTED Final   Stenotrophomonas maltophilia NOT DETECTED NOT DETECTED Final   Candida albicans NOT DETECTED NOT DETECTED Final   Candida auris NOT DETECTED NOT DETECTED Final   Candida glabrata NOT DETECTED NOT DETECTED Final   Candida krusei NOT DETECTED NOT DETECTED Final   Candida parapsilosis NOT DETECTED NOT DETECTED Final   Candida tropicalis NOT DETECTED NOT DETECTED Final   Cryptococcus neoformans/gattii NOT DETECTED NOT DETECTED Final    Comment: Performed at [facility], [address] [place], [place] [address]  Blood Culture (routine x 2)     Status: None   Collection Time: 11/14/21  9:54 AM   Specimen: BLOOD  Result Value Ref Range Status   Specimen Description   Final    BLOOD LEFT ARM Performed at [facility], [address], [place], [place] [address]    Special Requests   Final    BOTTLES DRAWN AEROBIC AND ANAEROBIC Blood Culture adequate volume Performed at [facility], [address] [place], [place] [address]    Culture   Final    NO GROWTH 5 DAYS Performed at [facility], [address] [place], [place] [address]    Report Status 11/19/2021 FINAL  Final  Aerobic/Anaerobic Culture w Gram Stain  (surgical/deep wound)     Status: None (Preliminary result)   Collection Time: 11/15/21  5:46 PM   Specimen: PATH Other; Tissue  Result Value Ref Range Status   Specimen Description   Final    WRIST RIGHT Performed at [facility], [address], [place], [place] [address]    Special Requests   Final    NONE Performed at [facility], [address], [place], [place] [address]    Gram Stain   Final    FEW WBC PRESENT, PREDOMINANTLY MONONUCLEAR FEW GRAM POSITIVE COCCI IN PAIRS AND CHAINS Performed at [facility], [address] [place], [place] [address]    Culture   Final    MODERATE STREPTOCOCCUS PYOGENES Beta hemolytic streptococci are predictably susceptible to penicillin and other beta lactams. Susceptibility testing not routinely performed. RARE STAPHYLOCOCCUS AUREUS SUSCEPTIBILITIES PERFORMED ON PREVIOUS CULTURE WITHIN THE LAST 5 DAYS. NO ANAEROBES ISOLATED; CULTURE IN PROGRESS FOR 5 DAYS    Report Status PENDING  Incomplete  Aerobic/Anaerobic Culture w Gram Stain (surgical/deep wound)     Status: None (Preliminary result)   Collection Time: 11/15/21  6:35 PM   Specimen: PATH Other; Tissue  Result Value Ref Range Status   Specimen Description   Final    HAND LEFT DORSAL Performed at [facility], [address], [place], [place] [address]    Special Requests   Final    NONE Performed at [facility], [address], [place], [place] [address]    Gram Stain   Final  MODERATE WBC PRESENT, PREDOMINANTLY MONONUCLEAR ABUNDANT GRAM POSITIVE COCCI Performed at [facility], [address] [place], [place] [address]    Culture   Final    ABUNDANT STREPTOCOCCUS PYOGENES Beta hemolytic streptococci are predictably susceptible to penicillin and other beta lactams. Susceptibility testing not routinely performed. RARE METHICILLIN RESISTANT STAPHYLOCOCCUS AUREUS RARE STREPTOCOCCUS  PNEUMONIAE NO ANAEROBES ISOLATED; CULTURE IN PROGRESS FOR 5 DAYS    Report Status PENDING  Incomplete   Organism ID, Bacteria METHICILLIN RESISTANT STAPHYLOCOCCUS AUREUS  Final   Organism ID, Bacteria STREPTOCOCCUS PNEUMONIAE  Final      Susceptibility   Methicillin resistant staphylococcus aureus - MIC*    CIPROFLOXACIN >=8 RESISTANT Resistant     ERYTHROMYCIN >=8 RESISTANT Resistant     GENTAMICIN <=0.5 SENSITIVE Sensitive     OXACILLIN >=4 RESISTANT Resistant     TETRACYCLINE <=1 SENSITIVE Sensitive     VANCOMYCIN 1 SENSITIVE Sensitive     TRIMETH/SULFA >=320 RESISTANT Resistant     CLINDAMYCIN >=8 RESISTANT Resistant     RIFAMPIN <=0.5 SENSITIVE Sensitive     Inducible Clindamycin NEGATIVE Sensitive     * RARE METHICILLIN RESISTANT STAPHYLOCOCCUS AUREUS   Streptococcus pneumoniae - MIC*    ERYTHROMYCIN >=8 RESISTANT Resistant     LEVOFLOXACIN 0.5 SENSITIVE Sensitive     VANCOMYCIN <=0.12 SENSITIVE Sensitive     PENICILLIN (meningitis) 1 RESISTANT Resistant     PENO - penicillin 1      PENICILLIN (non-meningitis) 1 SENSITIVE Sensitive     PENICILLIN (oral) 1 INTERMEDIATE Intermediate     CEFTRIAXONE (non-meningitis) 1 SENSITIVE Sensitive     CEFTRIAXONE (meningitis) 1 INTERMEDIATE Intermediate     * RARE STREPTOCOCCUS PNEUMONIAE  Culture, blood (Routine X 2) w Reflex to ID Panel     Status: None   Collection Time: 11/16/21  4:51 AM   Specimen: BLOOD  Result Value Ref Range Status   Specimen Description   Final    BLOOD RIGHT FOOT Performed at [facility], [address] [place], [place] [address]    Special Requests   Final    BOTTLES DRAWN AEROBIC ONLY Blood Culture adequate volume Performed at [facility], [address] [place], [place] [address]    Culture   Final    NO GROWTH 5 DAYS Performed at [facility], [address] [place], [place] [address]    Report Status 11/21/2021 FINAL  Final  Culture, blood  (Routine X 2) w Reflex to ID Panel     Status: None   Collection Time: 11/16/21  4:51 AM   Specimen: BLOOD  Result Value Ref Range Status   Specimen Description   Final    BLOOD RIGHT ANKLE Performed at [facility], [address] [place], [place] [address]    Special Requests   Final    BOTTLES DRAWN AEROBIC ONLY Blood Culture adequate volume Performed at [facility], [address] [place], [place] [address]    Culture   Final    NO GROWTH 5 DAYS Performed at [facility], [address] [place], [place] [address]    Report Status 11/21/2021 FINAL  Final  MRSA Next Gen by PCR, Nasal     Status: Abnormal   Collection Time: 11/17/21 12:54 PM   Specimen: Nasal Mucosa; Nasal Swab  Result Value Ref Range Status   MRSA by PCR Next Gen DETECTED (A) NOT DETECTED Final    Comment:  RESULT CALLED TO, READ BACK BY AND VERIFIED WITH: SAWYER,A. RN AT 1534 11/17/21 MULLINS,T (NOTE) The GeneXpert MRSA Assay (FDA approved for NASAL specimens only), is one component of a comprehensive MRSA colonization surveillance program. It is not intended to diagnose MRSA infection nor to guide or monitor treatment for MRSA infections. Test performance is not FDA approved in patients less than 73 years old. Performed at [facility], [place] [address] [place], [place] [address]     Studies/Results: No results found.    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  Did have some new aortic insufficiency noted on transthoracic echocardiogram  #1  Polymicrobial infections of her extremities  As an inpatient we can simplify him to daptomycin which will cover all of the organisms that of been isolated from his cultures in the operating room as well as from his blood  And he nears discharge would plan on giving him either a long acting antibiotic as a rate of [place] or potentially a highly bioavailable 1 such as Zyvox for a month with follow-up with [place] in clinic.  #2 IVDU: needs plan for treatment long term  #3 HCV + antibody: Check hep C RNA and genotype was hepatitis B surface antigen and surface antibody and hepatitis A total antibodies.     LOS: 7 days   Alcide Evener 11/21/2021, 3:26 PM

## 2021-11-21 NOTE — Progress Notes (Signed)
PROGRESS NOTE    Jason Buchanan  PNT:[id number] DOB: 1986/11/19 DOA: 11/14/2021 PCP: Lavinia Sharps, NP    Brief Narrative:  Jason Buchanan was admitted to the hospital with the working diagnosis of severe sepsis due to right upper extremities cellulitis, complicated with streptococcus bacteremia.    35 yo male with the past medical history of polysubstance abuse who presented with bilateral arm pain. Patient injecting multiple drugs in both arms. On his initial physical examination his blood pressure was 107/88, HR 113, RR 17, oxygen saturation 100%, lungs clear to auscultation, heart S1 and S2 present and rhythmic, abdomen soft, right upper extremity with edema and erythema, bullous lesions. Left hand with erythema.    Sodium 130, potassium 3.0, chloride 91, bicarb 23, glucose 75, BUN 45, creatinine 1.19, lactic acid 3.5, white count 27.6, hemoglobin 13.9, hematocrit 41.9, platelets 321. SARS COVID-19 negative.   Urine analysis specific gravity 1.018. Toxicology screen positive for amphetamines.   Chest radiograph no infiltrates.   EKG 113 bpm, normal axis, normal intervals, sinus rhythm with poor R wave progression, positive LVH, no significant ST segment or T wave changes.   CT scan of upper extremities showed extensive cellulitis. He was placed on broad-spectrum antibiotic therapy.   12/13 I&D of bilateral upper extremities. Blood culture positive for Streptococcus group B.   12/15 patient developed melena with acute blood loss, hemoglobin down to 5.8.  Patient received 2 units packed red blood cell transfusion and was placed on intravenous pantoprazole. 12/15 upper endoscopy showing mild proximal mid esophagitis.  Nonbleeding gastric ulcers with no stigmata of bleeding.  Positive adherent clot.  Nonbleeding duodenal ulcers with adherent clot.  Ulcers were injected.   He had postprocedure respiratory failure that required invasive mechanical ventilation.   12/15, wound evaluation  of the left upper extremity with dressing change under sedation.  Incision and drainage of right upper extremity and loose closure of wound, right upper extremity.   Extubated late 12/15.   Tolerating well antibiotic therapy and dressing changes.  May requires further I&D depending on character of drainage and most likely require further skin debridement on the right.   Assessment & Plan:   Principal Problem:   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HCC) Active Problems:   Sepsis due to cellulitis (HCC)   Polysubstance abuse (HCC)   Hypokalemia   Streptococcal bacteremia   Right arm cellulitis   Staphylococcus aureus infection   Acute blood loss anemia   Upper GI bleed   Respiratory failure, post-operative (HCC)   Bilateral upper extremities cellulitis, in the setting of IV drug abuse. Streptococcus pyogenes bacteremia. Left hand wound culture positive for staph aureus.  Sp I&D bilateral upper extremities.  Follow up blood cultures with no growth on 12/14.    Continue to have significant wound pain bilateral arms.  Wbc today is 25.8 and he has remained afebrile.  Continue with daptomycin for antibiotic therapy.   Continue pain control with oxycodone, acetaminophen and cyclobenzaprine. Patient not tolerating well IV morphine, will change to IV hydromorphone for pain control.   Plan to follow up with surgery and ID recommendations   2. Upper GI bleed with acute blood loss anemia, gastric and duodenal ulcers.  Patient tolerating well po with no nausea or vomiting. Continue antiacid therapy with pantoprazole.    3. Hyponatremia, hypokalemia and hypomagnesemia. Severe protein calorie malnutrition.  Renal function has been stable, with serum cr at 0,.48, his K is 3,8 and serum bicarbonate at 22. Na continue  to be low at 129, possible SIADH, will add fluid restriction.    4. Polysubstance abuse and depression. C Continue with 100 mg bid of seroquel  and trazodone  Follow up with psychiatry as outpatient    5. Post operative respiratory failure. Clinically resolved, patient with less than 24 hrs on mechanical ventilation    Patient continue to be at high risk for worsening sepsis   Status is: Inpatient  Remains inpatient appropriate because: IV antibiotic therapy   DVT prophylaxis: Enoxaparin   Code Status:    full  Family Communication:   No family at the bedside      Nutrition Status: Nutrition Problem: Increased nutrient needs Etiology: wound healing Signs/Symptoms: estimated needs Interventions: Ensure Enlive (each supplement provides 350kcal and 20 grams of protein), MVI, Prostat      Consultants:  ID   Procedures:   As above   Antimicrobials:  Daptomycin     Subjective:  Patient continue to have pain in bilateral upper extremities, mils improvement with oral oxycodone, patient with nausea when using IV morphine.   Objective: Vitals:   11/21/21 0400 11/21/21 0642 11/21/21 1033 11/21/21 1430  BP: (!) 142/57 (!) 146/63 (!) 142/60 (!) 142/64  Pulse: 95 91 (!) 102 (!) 103  Resp: 17 20 16 18   Temp: 99.5 F (37.5 C) (!) 97.3 F (36.3 C) 98.4 F (36.9 C) 98.2 F (36.8 C)  TempSrc: Axillary Oral  Oral  SpO2: 100% 100% 100% 100%  Weight:      Height:        Intake/Output Summary (Last 24 hours) at 11/21/2021 1455 Last data filed at 11/21/2021 1409 Gross per 24 hour  Intake 880 ml  Output 4580 ml  Net -3700 ml   Filed Weights   11/14/21 0835 11/15/21 1608  Weight: 60 kg 60 kg    Examination:   General: Not in pain or dyspnea, deconditioned  Neurology: Awake and alert, non focal  E ENT: mild pallor, no icterus, oral mucosa moist Cardiovascular: No JVD. S1-S2 present, rhythmic, no gallops, rubs, or murmurs. No lower extremity edema. Pulmonary: positive breath sounds bilaterally, adequate air movement, no wheezing, rhonchi or rales. Gastrointestinal. Abdomen soft and non tender Skin. Bilateral  upper extremities with dressings in place.  Musculoskeletal: no joint deformities     Data Reviewed: I have personally reviewed following labs and imaging studies  CBC: Recent Labs  Lab 11/17/21 1352 11/17/21 1733 11/18/21 0308 11/19/21 0300 11/20/21 0502 11/21/21 0448  WBC 59.0*  --  43.1* 36.2* 28.3* 25.8*  NEUTROABS 45.7*  --   --   --   --   --   HGB 8.5* 8.7* 7.9* 8.1* 8.2* 8.1*  HCT 24.6* 25.4* 23.3* 24.5* 25.7* 25.0*  MCV 83.7  --  85.0 86.9 88.9 89.3  PLT 317  --  309 351 426* [id number]*   Basic Metabolic Panel: Recent Labs  Lab 11/16/21 0451 11/17/21 0404 11/17/21 1352 11/18/21 0308 11/19/21 0300 11/20/21 0502 11/21/21 0448  NA 133* 132* 133* 131* 131* 131* 129*  K 4.4 4.5 4.7 4.1 3.3* 3.8 3.8  CL 100 105 106 105 104 105 102  CO2 24 21* 21* 20* 21* 21* 22  GLUCOSE 126* 108* 118* 107* 90 120* 110*  BUN 40* 37* 35* 28* 16 13 14   CREATININE 0.62 0.62 0.64 0.65 0.59* 0.53* 0.48*  CALCIUM 7.6* 7.0* 7.2* 7.0* 7.0* 7.2* 7.5*  MG 1.6* 1.6*  --  1.9 1.6* 2.1  --    GFR:  Estimated Creatinine Clearance: 109.4 mL/min (A) (by C-G formula based on SCr of 0.48 mg/dL (L)). Liver Function Tests: Recent Labs  Lab 11/15/21 0431 11/17/21 1352  AST 37 42*  ALT 20 24  ALKPHOS 96 78  BILITOT 1.4* 0.6  PROT 4.5* 5.2*  ALBUMIN 1.6* <1.5*   No results for input(s): LIPASE, AMYLASE in the last 168 hours. No results for input(s): AMMONIA in the last 168 hours. Coagulation Profile: Recent Labs  Lab 11/15/21 0431 11/17/21 1325  INR 1.2 1.1   Cardiac Enzymes: Recent Labs  Lab 11/18/21 0308  CKTOTAL 65   BNP (last 3 results) No results for input(s): PROBNP in the last 8760 hours. HbA1C: No results for input(s): HGBA1C in the last 72 hours. CBG: Recent Labs  Lab 11/18/21 1158 11/18/21 1558  GLUCAP 160* 85   Lipid Profile: No results for input(s): CHOL, HDL, LDLCALC, TRIG, CHOLHDL, LDLDIRECT in the last 72 hours. Thyroid Function Tests: No results for input(s):  TSH, T4TOTAL, FREET4, T3FREE, THYROIDAB in the last 72 hours. Anemia Panel: No results for input(s): VITAMINB12, FOLATE, FERRITIN, TIBC, IRON, RETICCTPCT in the last 72 hours.    Radiology Studies: I have reviewed all of the imaging during this hospital visit personally     Scheduled Meds:  (feeding supplement) PROSource Plus  30 mL Oral BID BM   acetaminophen  650 mg Oral Q6H   vitamin C  500 mg Oral Daily   Chlorhexidine Gluconate Cloth  6 each Topical Daily   feeding supplement  237 mL Oral BID BM   folic acid  1 mg Oral Daily   multivitamin with minerals  1 tablet Oral Daily   mupirocin ointment  1 application Nasal BID   pantoprazole  40 mg Intravenous Q12H   QUEtiapine  100 mg Oral BID   thiamine  100 mg Oral Daily   Continuous Infusions:  DAPTOmycin (CUBICIN)  IV Stopped (11/20/21 2106)     LOS: 7 days        Issai Werling Gerome Apley, MD

## 2021-11-21 NOTE — Evaluation (Signed)
Physical Therapy Evaluation Patient Details Name: Jason Buchanan MRN: [id number] DOB: 1986-05-10 Today's Date: 11/21/2021  History of Present Illness  Pt is 35 yo male presented on 12/12 with bil UE edema/erythema after IV drug use.  Pt required R hand fasciotomy, carpal tunnel release, 3 compartment forearm fasciotomy, Guyon's canal release , and I and D hand/forearm on 12/15; and L wrist I and D, left volar forearm fasciotmoy, and L wrist carpal tunnel release on 12/13. On 12/14 concern for GIB and required 3 untis PRBC with ED 12/15.  Pt with hx of polysubstance abuse.  Clinical Impression  Pt admitted with above diagnosis. At baseline, pt is independent and homeless.  Today, pt limited due to rib and bil UE pain.  Additionally, pt had syncopal symptoms in standing and had to return to supine - suspect orthostatic but having to take BP in leg in dependent position so reading was normal.  Pt requiring assist for transfers.  Additionally, pt not really able to use either UE functionally due to pain/dressings.  Pt will likely be unsafe to return to shelter/homeless at d/c due to functional limitations, will likely require SNF for therapy to progress. Pt currently with functional limitations due to the deficits listed below (see PT Problem List). Pt will benefit from skilled PT to increase their independence and safety with mobility to allow discharge to the venue listed below.          Recommendations for follow up therapy are one component of a multi-disciplinary discharge planning process, led by the attending physician.  Recommendations may be updated based on patient status, additional functional criteria and insurance authorization.  Follow Up Recommendations Skilled nursing-short term rehab (<3 hours/day)    Assistance Recommended at Discharge Frequent or constant Supervision/Assistance  Functional Status Assessment Patient has had a recent decline in their functional status and demonstrates  the ability to make significant improvements in function in a reasonable and predictable amount of time.  Equipment Recommendations  Other (comment) (needs further assessment)    Recommendations for Other Services       Precautions / Restrictions Precautions Precautions: Fall Precaution Comments: ?orthostatic; painful UE Restrictions Other Position/Activity Restrictions: OT reached out to MD - no restrictions on bil UE      Mobility  Bed Mobility Overal bed mobility: Needs Assistance Bed Mobility: Supine to Sit;Sit to Supine     Supine to sit: Min assist;HOB elevated Sit to supine: Min assist;HOB elevated   General bed mobility comments: Increased time; HOB at least 50 degrees    Transfers Overall transfer level: Needs assistance Equipment used: 1 person hand held assist Transfers: Sit to/from Stand Sit to Stand: Mod assist           General transfer comment: Mod A to steady; limited due to lightheadedness    Ambulation/Gait               General Gait Details: unable due to pain/lightheaded  Stairs            Wheelchair Mobility    Modified Rankin (Stroke Patients Only)       Balance Overall balance assessment: Needs assistance Sitting-balance support: No upper extremity supported Sitting balance-Leahy Scale: Fair     Standing balance support: No upper extremity supported Standing balance-Leahy Scale: Poor Standing balance comment: Requiring min-mod A; limited due to lightheadedness  Pertinent Vitals/Pain Pain Assessment: 0-10 Pain Score: 9  Pain Location: Arms and ribs Pain Descriptors / Indicators: Discomfort Pain Intervention(s): Limited activity within patient's tolerance;Monitored during session    Home Living Family/patient expects to be discharged to:: Shelter/Homeless                        Prior Function Prior Level of Function : Independent/Modified Independent              Mobility Comments: Ambulates in community ADLs Comments: Independent     Hand Dominance        Extremity/Trunk Assessment   Upper Extremity Assessment Upper Extremity Assessment: Defer to OT evaluation (Bil UE very painful, wrapped in gauze, demonstrating limited movements of L hand/wrist and none on R.  Unable to feed self.  Defer to OT for further detail.)    Lower Extremity Assessment Lower Extremity Assessment: LLE deficits/detail;RLE deficits/detail RLE Deficits / Details: ROM: WFL; MMT: ankle 3/5, knee 2/5 painful, hip 2/5; limited by pain LLE Deficits / Details: ROM WFL; MMT at least 3/5 but not further tested due to generalized pain    Cervical / Trunk Assessment Cervical / Trunk Assessment: Normal  Communication      Cognition Arousal/Alertness: Lethargic Behavior During Therapy: WFL for tasks assessed/performed Overall Cognitive Status: Within Functional Limits for tasks assessed  Requiring encouragement for activity                                        General Comments General comments (skin integrity, edema, etc.): Pt became lightheaded with standing - pale, diaphoretic, so returned to sitting almost immediately.  Took BP and read 140/60 but question accuracy as had to take in leg due to sx/pain in arms and leg in dependent position. Felt better in supine. In supine, pt asking for assist drinking and eating rice crispy treat.  Pt unable to grip - assisted pt with eating. Discussed likely need for rehab at d/c - pt agreeable.    Exercises     Assessment/Plan    PT Assessment Patient needs continued PT services  PT Problem List Decreased strength;Decreased mobility;Decreased safety awareness;Decreased range of motion;Decreased coordination;Decreased activity tolerance;Cardiopulmonary status limiting activity;Decreased balance;Decreased knowledge of use of DME;Pain       PT Treatment Interventions DME instruction;Therapeutic  activities;Modalities;Gait training;Therapeutic exercise;Patient/family education;Stair training;Balance training;Functional mobility training    PT Goals (Current goals can be found in the Care Plan section)  Acute Rehab PT Goals Patient Stated Goal: decrease pain PT Goal Formulation: With patient Time For Goal Achievement: 12/05/21 Potential to Achieve Goals: Good    Frequency Min 2X/week   Barriers to discharge Decreased caregiver support;Inaccessible home environment      Co-evaluation               AM-PAC PT "6 Clicks" Mobility  Outcome Measure Help needed turning from your back to your side while in a flat bed without using bedrails?: A Lot Help needed moving from lying on your back to sitting on the side of a flat bed without using bedrails?: A Lot Help needed moving to and from a bed to a chair (including a wheelchair)?: A Lot Help needed standing up from a chair using your arms (e.g., wheelchair or bedside chair)?: A Lot Help needed to walk in hospital room?: A Lot Help needed climbing 3-5 steps with a railing? :  Total 6 Click Score: 11    End of Session   Activity Tolerance: Treatment limited secondary to medical complications (Comment) (pain and syncopal symptoms) Patient left: in bed;with call bell/phone within reach;with bed alarm set Nurse Communication: Mobility status (questionable orthostatic) PT Visit Diagnosis: Other abnormalities of gait and mobility (R26.89);Muscle weakness (generalized) (M62.81);Pain Pain - Right/Left:  (bil) Pain - part of body: Arm    Time: 5638-9373 PT Time Calculation (min) (ACUTE ONLY): 24 min   Charges:   PT Evaluation $PT Eval Moderate Complexity: 1 Mod PT Treatments $Therapeutic Activity: 8-22 mins        Anise Salvo, PT Acute Rehab Services Pager [phone] [facility] [phone]   Rayetta Humphrey 11/21/2021, 12:36 PM

## 2021-11-21 NOTE — Evaluation (Signed)
Occupational Therapy Evaluation Patient Details Name: Jason Buchanan MRN: [id number] DOB: 1986-02-06 Today's Date: 11/21/2021   History of Present Illness Pt is 35 yo male presented on 12/12 with bil UE edema/erythema after IV drug use.  Pt required R hand fasciotomy, carpal tunnel release, 3 compartment forearm fasciotomy, Guyon's canal release , and I and D hand/forearm on 12/15; and L wrist I and D, left volar forearm fasciotmoy, and L wrist carpal tunnel release on 12/13. On 12/14 concern for GIB and required 3 untis PRBC with ED 12/15.  Pt with hx of polysubstance abuse.   Clinical Impression   Patient is a 35 year old male who was admitted for above. Currently, patients increased pain in BUE impacting participation in all tasks. Limited eval to self feeding tasks on this date as patient wanted to finish lunch. Patient was educated on positioning of BUE with elevation to reduce edema in BUE. Patient had pain medication in system at this time with pain still limiting participation in all self feeding tasks. Patients pain, decreased ROM, decreased activity tolerance, decreased endurance, decreased sitting balance are impacting participation in ADLs at this time. Patient would continue to benefit from skilled OT services at this time while admitted and after d/c to address noted deficits in order to improve overall safety and independence in ADLs.        Recommendations for follow up therapy are one component of a multi-disciplinary discharge planning process, led by the attending physician.  Recommendations may be updated based on patient status, additional functional criteria and insurance authorization.   Follow Up Recommendations  Skilled nursing-short term rehab (<3 hours/day)    Assistance Recommended at Discharge Frequent or constant Supervision/Assistance  Functional Status Assessment  Patient has had a recent decline in their functional status and demonstrates the ability to make  significant improvements in function in a reasonable and predictable amount of time.  Equipment Recommendations  Other (comment) (defer to next venue)    Recommendations for Other Services       Precautions / Restrictions Precautions Precautions: Fall Precaution Comments: orthostatic, painful UE Restrictions Weight Bearing Restrictions: No Other Position/Activity Restrictions: no restrictions on BUE per Charma Igo PA on secure chat 11/21/21      Mobility Bed Mobility               General bed mobility comments: patient declined wanting to focus on self feeding tasks on this date.    Transfers                          Balance Overall balance assessment: Needs assistance Sitting-balance support: No upper extremity supported Sitting balance-Leahy Scale: Fair     Standing balance support: No upper extremity supported Standing balance-Leahy Scale: Poor Standing balance comment: Requiring min-mod A; limited due to lightheadedness                           ADL either performed or assessed with clinical judgement   ADL Overall ADL's : Needs assistance/impaired Eating/Feeding: Minimal assistance;Cueing for compensatory techinques;With adaptive utensils Eating/Feeding Details (indicate cue type and reason): in bed. patient trialed red foam utensils to participate in slef feeding with patietn able to scoop and spear items with red foal utensils with Set up. nurse educated on using foam on utensils. patietn was quick to fatgiue with increased pain with use. patient was educated on the importance of completing self feeding first  prior to attempting to have nursing assist. patient was positioned with larger water bottle with straw to promote patients ability to take drinks. patient was able to demonstrate ability to lean trunk forwards off back of bed to take sips from straw with MI. Grooming: Wash/dry face;Maximal assistance   Upper Body Bathing: Total  assistance;Bed level   Lower Body Bathing: Bed level;Total assistance   Upper Body Dressing : Bed level;Total assistance   Lower Body Dressing: Bed level;Total assistance   Toilet Transfer: +2 for physical assistance;+2 for safety/equipment Toilet Transfer Details (indicate cue type and reason): deferred on this date with patient having syncope episode with PT earlier on this date. Toileting- Clothing Manipulation and Hygiene: Total assistance;Bed level   Tub/ Shower Transfer: +2 for physical assistance;+2 for safety/equipment   Functional mobility during ADLs: +2 for physical assistance;+2 for safety/equipment       Vision Patient Visual Report: No change from baseline       Perception     Praxis      Pertinent Vitals/Pain Pain Assessment: Faces Faces Pain Scale: Hurts whole lot Pain Location: Arms and ribs Pain Descriptors / Indicators: Discomfort;Grimacing;Guarding Pain Intervention(s): Monitored during session;Limited activity within patient's tolerance;Patient requesting pain meds-RN notified;Repositioned     Hand Dominance Right   Extremity/Trunk Assessment Upper Extremity Assessment Upper Extremity Assessment: RUE deficits/detail;LUE deficits/detail RUE Deficits / Details: digits are firm and edemaous on this date. patient noted to have flexion about 10 degrees closer than resting flexion PIPs. DIPs are firm no ROM noted. patient able to feel light touch on palm but not digits. patient unable to tolerate PROM of digits on this date. elbow noted to. rotate UE to "bend elbow" with no noted flexion.  dressings noted to reach from MCPs to upper arm proximally. unable to tolerate PROM with pain medication on board at this time. RUE: Unable to fully assess due to pain RUE Sensation: decreased light touch RUE Coordination: decreased fine motor;decreased gross motor LUE Deficits / Details: paitent able to wiggle digits on this UE and hold utensil with red foam handle to  bring to mouth. unable to form full fist on this side noted to have firmness in digits as well. shoulder ROM WFL, elbow WFL ROM. unable to tolerate PROM of digits. LUE: Unable to fully assess due to pain LUE Sensation: decreased light touch LUE Coordination: decreased fine motor;decreased gross motor   Lower Extremity Assessment Lower Extremity Assessment: Defer to PT evaluation   Cervical / Trunk Assessment Cervical / Trunk Assessment: Normal   Communication Communication Communication: No difficulties   Cognition Arousal/Alertness: Lethargic Behavior During Therapy: WFL for tasks assessed/performed Overall Cognitive Status: Within Functional Limits for tasks assessed                                       General Comments  Pt became lightheaded with standing - pale, diaphoretic, so returned to sitting almost immediately.  Took BP and read 140/60 but question accuracy as had to take in leg due to sx/pain in arms and leg in dependent position. Felt better in supine. In supine, pt asking for assist drinking and eating rice crispy treat.  Pt unable to grip - assisted pt with eating. Discussed likely need for rehab at d/c - pt agreeable.    Exercises     Shoulder Instructions      Home Living Family/patient expects to be discharged to::  Shelter/Homeless                                        Prior Functioning/Environment Prior Level of Function : Independent/Modified Independent             Mobility Comments: Ambulates in community ADLs Comments: Independent        OT Problem List: Impaired UE functional use;Decreased range of motion;Decreased strength;Decreased activity tolerance;Impaired balance (sitting and/or standing);Decreased safety awareness;Decreased knowledge of use of DME or AE      OT Treatment/Interventions: Self-care/ADL training;Therapeutic exercise;Neuromuscular education;Energy conservation;DME and/or AE  instruction;Therapeutic activities;Balance training;Patient/family education    OT Goals(Current goals can be found in the care plan section) Acute Rehab OT Goals Patient Stated Goal: to get pain under control OT Goal Formulation: With patient Time For Goal Achievement: 12/05/21 Potential to Achieve Goals: Good  OT Frequency: Min 2X/week   Barriers to D/C: Decreased caregiver support;Inaccessible home environment  homeless prior level       Co-evaluation              AM-PAC OT "6 Clicks" Daily Activity     Outcome Measure Help from another person eating meals?: A Little Help from another person taking care of personal grooming?: A Little Help from another person toileting, which includes using toliet, bedpan, or urinal?: A Lot Help from another person bathing (including washing, rinsing, drying)?: A Lot Help from another person to put on and taking off regular upper body clothing?: A Lot Help from another person to put on and taking off regular lower body clothing?: A Lot 6 Click Score: 14   End of Session Equipment Utilized During Treatment: Other (comment) (CONE water bottle with straw and red foam) Nurse Communication: Other (comment) (red foam and water bottle use)  Activity Tolerance: Patient tolerated treatment well Patient left: in bed;with call bell/phone within reach;with bed alarm set  OT Visit Diagnosis: Muscle weakness (generalized) (M62.81);Feeding difficulties (R63.3);Pain                Time: 0940-7680 OT Time Calculation (min): 36 min Charges:  OT General Charges $OT Visit: 1 Visit OT Evaluation $OT Eval Low Complexity: 1 Low OT Treatments $Self Care/Home Management : 8-22 mins  Sharyn Blitz OTR/L, MS Acute Rehabilitation Department Office# [phone] Pager# [phone]   Ardyth Harps 11/21/2021, 4:30 PM

## 2021-11-22 ENCOUNTER — Encounter (HOSPITAL_COMMUNITY)
Admission: EM | Disposition: A | Discharge status: Home / Self Care | Payer: Self-pay | Source: Home / Self Care | Attending: Internal Medicine

## 2021-11-22 ENCOUNTER — Inpatient Hospital Stay (HOSPITAL_COMMUNITY): Payer: 59 | Admitting: Certified Registered Nurse Anesthetist

## 2021-11-22 ENCOUNTER — Encounter (HOSPITAL_COMMUNITY): Payer: Self-pay | Admitting: Internal Medicine

## 2021-11-22 HISTORY — PX: INCISION AND DRAINAGE OF WOUND: SHX1803

## 2021-11-22 HISTORY — PX: APPLICATION OF WOUND VAC: SHX5189

## 2021-11-22 LAB — BASIC METABOLIC PANEL
Anion gap: 6 (ref 5–15)
BUN: 15 mg/dL (ref 6–20)
CO2: 24 mmol/L (ref 22–32)
Calcium: 7.6 mg/dL — ABNORMAL LOW (ref 8.9–10.3)
Chloride: 101 mmol/L (ref 98–111)
Creatinine, Ser: 0.4 mg/dL — ABNORMAL LOW (ref 0.61–1.24)
GFR, Estimated: >60 mL/min (ref >60)
Glucose, Bld: 104 mg/dL — ABNORMAL HIGH (ref 70–99)
Potassium: 3.8 mmol/L (ref 3.5–5.1)
Sodium: 131 mmol/L — ABNORMAL LOW (ref 135–145)

## 2021-11-22 LAB — CBC
HCT: 23.6 % — ABNORMAL LOW (ref 39.0–52.0)
Hemoglobin: 7.5 g/dL — ABNORMAL LOW (ref 13.0–17.0)
MCH: 28.5 pg (ref 26.0–34.0)
MCHC: 31.8 g/dL (ref 30.0–36.0)
MCV: 89.7 fL (ref 80.0–100.0)
Platelets: 664 10*3/uL — ABNORMAL HIGH (ref 150–400)
RBC: 2.63 MIL/uL — ABNORMAL LOW (ref 4.22–5.81)
RDW: 15.7 % — ABNORMAL HIGH (ref 11.5–15.5)
WBC: 21.7 10*3/uL — ABNORMAL HIGH (ref 4.0–10.5)
nRBC: 0 % (ref 0.0–0.2)

## 2021-11-22 LAB — HEPATITIS B SURFACE ANTIGEN: Hepatitis B Surface Ag: NONREACTIVE

## 2021-11-22 LAB — HEPATITIS A ANTIBODY, TOTAL: hep A Total Ab: REACTIVE — AB

## 2021-11-22 SURGERY — IRRIGATION AND DEBRIDEMENT WOUND
Anesthesia: General | Site: Arm Lower | Laterality: Right

## 2021-11-22 MED ORDER — 0.9 % SODIUM CHLORIDE (POUR BTL) OPTIME
TOPICAL | Status: DC | PRN
  Administered 2021-11-22: 19:00:00 1000 mL

## 2021-11-22 MED ORDER — OXYCODONE HCL 5 MG/5ML PO SOLN: 5.0000 mg | Freq: Once | ORAL | Status: DC | PRN

## 2021-11-22 MED ORDER — KETAMINE HCL-SODIUM CHLORIDE 100-0.9 MG/10ML-% IV SOSY
PREFILLED_SYRINGE | INTRAVENOUS | Status: AC
  Filled 2021-11-22: qty 10

## 2021-11-22 MED ORDER — HYDROMORPHONE HCL 1 MG/ML IJ SOLN
INTRAMUSCULAR | Status: AC
  Filled 2021-11-22: qty 1

## 2021-11-22 MED ORDER — FENTANYL CITRATE (PF) 100 MCG/2ML IJ SOLN
INTRAMUSCULAR | Status: DC | PRN
  Administered 2021-11-22: 50 ug via INTRAVENOUS
  Administered 2021-11-22: 100 ug via INTRAVENOUS
  Administered 2021-11-22: 50 ug via INTRAVENOUS

## 2021-11-22 MED ORDER — FENTANYL CITRATE (PF) 100 MCG/2ML IJ SOLN
INTRAMUSCULAR | Status: AC
  Filled 2021-11-22: qty 2

## 2021-11-22 MED ORDER — ONDANSETRON HCL 4 MG/2ML IJ SOLN: 4.0000 mg | Freq: Once | INTRAMUSCULAR | Status: DC | PRN

## 2021-11-22 MED ORDER — HYDROMORPHONE HCL 1 MG/ML IJ SOLN
0.2500 mg | INTRAMUSCULAR | Status: DC | PRN
  Administered 2021-11-22 (×4): 0.5 mg via INTRAVENOUS

## 2021-11-22 MED ORDER — DEXAMETHASONE SODIUM PHOSPHATE 10 MG/ML IJ SOLN
INTRAMUSCULAR | Status: AC
  Filled 2021-11-22: qty 1

## 2021-11-22 MED ORDER — LIDOCAINE 2% (20 MG/ML) 5 ML SYRINGE
INTRAMUSCULAR | Status: DC | PRN
  Administered 2021-11-22: 80 mg via INTRAVENOUS

## 2021-11-22 MED ORDER — LACTATED RINGERS IV SOLN: INTRAVENOUS | Status: DC

## 2021-11-22 MED ORDER — PANTOPRAZOLE SODIUM 40 MG PO TBEC
40.0000 mg | DELAYED_RELEASE_TABLET | Freq: Two times a day (BID) | ORAL | Status: DC
  Administered 2021-11-22 – 2022-01-09 (×92): 40 mg via ORAL
  Filled 2021-11-22 (×92): qty 1

## 2021-11-22 MED ORDER — PROPOFOL 10 MG/ML IV BOLUS
INTRAVENOUS | Status: AC
  Filled 2021-11-22: qty 20

## 2021-11-22 MED ORDER — OXYCODONE HCL 5 MG PO TABS: 5.0000 mg | ORAL_TABLET | Freq: Once | ORAL | Status: DC | PRN

## 2021-11-22 MED ORDER — CHLORHEXIDINE GLUCONATE 0.12 % MT SOLN
15.0000 mL | Freq: Once | OROMUCOSAL | Status: AC
  Administered 2021-11-22: 18:00:00 15 mL via OROMUCOSAL

## 2021-11-22 MED ORDER — AMISULPRIDE (ANTIEMETIC) 5 MG/2ML IV SOLN: 10.0000 mg | Freq: Once | INTRAVENOUS | Status: DC | PRN

## 2021-11-22 MED ORDER — ONDANSETRON HCL 4 MG/2ML IJ SOLN
INTRAMUSCULAR | Status: DC | PRN
  Administered 2021-11-22: 4 mg via INTRAVENOUS

## 2021-11-22 MED ORDER — MIDAZOLAM HCL 2 MG/2ML IJ SOLN
INTRAMUSCULAR | Status: DC | PRN
  Administered 2021-11-22: 2 mg via INTRAVENOUS

## 2021-11-22 MED ORDER — KETAMINE HCL 50 MG/ML IJ SOLN
INTRAMUSCULAR | Status: DC | PRN
  Administered 2021-11-22: 30 mg via INTRAMUSCULAR

## 2021-11-22 MED ORDER — KETAMINE HCL 10 MG/ML IJ SOLN
INTRAMUSCULAR | Status: AC
  Filled 2021-11-22: qty 1

## 2021-11-22 MED ORDER — SODIUM CHLORIDE 0.9 % IR SOLN
Status: DC | PRN
  Administered 2021-11-22 (×2): 3000 mL

## 2021-11-22 MED ORDER — ONDANSETRON HCL 4 MG/2ML IJ SOLN
INTRAMUSCULAR | Status: AC
  Filled 2021-11-22: qty 2

## 2021-11-22 MED ORDER — DEXAMETHASONE SODIUM PHOSPHATE 10 MG/ML IJ SOLN
INTRAMUSCULAR | Status: DC | PRN
  Administered 2021-11-22: 5 mg via INTRAVENOUS

## 2021-11-22 MED ORDER — PROPOFOL 10 MG/ML IV BOLUS
INTRAVENOUS | Status: DC | PRN
  Administered 2021-11-22: 200 mg via INTRAVENOUS

## 2021-11-22 MED ORDER — MIDAZOLAM HCL 2 MG/2ML IJ SOLN
INTRAMUSCULAR | Status: AC
  Filled 2021-11-22: qty 2

## 2021-11-22 SURGICAL SUPPLY — 37 items
BAG COUNTER SPONGE SURGICOUNT (BAG) IMPLANT
BAG ZIPLOCK 12X15 (MISCELLANEOUS) ×3 IMPLANT
BNDG ELASTIC 3X5.8 VLCR STR LF (GAUZE/BANDAGES/DRESSINGS) ×1 IMPLANT
BNDG ELASTIC 4X5.8 VLCR STR LF (GAUZE/BANDAGES/DRESSINGS) ×1 IMPLANT
BNDG GAUZE ELAST 4 BULKY (GAUZE/BANDAGES/DRESSINGS) ×3 IMPLANT
CORD BIPOLAR FORCEPS 12FT (ELECTRODE) ×3 IMPLANT
COVER SURGICAL LIGHT HANDLE (MISCELLANEOUS) ×3 IMPLANT
CUFF TOURN SGL QUICK 18X4 (TOURNIQUET CUFF) ×3 IMPLANT
CUFF TOURN SGL QUICK 24 (TOURNIQUET CUFF) ×3
CUFF TRNQT CYL 24X4X16.5-23 (TOURNIQUET CUFF) IMPLANT
DRAIN PENROSE 0.5X18 (DRAIN) IMPLANT
DRAPE SURG 17X11 SM STRL (DRAPES) ×6 IMPLANT
DRSG KUZMA FLUFF (GAUZE/BANDAGES/DRESSINGS) ×1 IMPLANT
ELECT REM PT RETURN 15FT ADLT (MISCELLANEOUS) ×3 IMPLANT
GAUZE SPONGE 4X4 12PLY STRL (GAUZE/BANDAGES/DRESSINGS) ×4 IMPLANT
GAUZE XEROFORM 5X9 LF (GAUZE/BANDAGES/DRESSINGS) ×1 IMPLANT
GLOVE SURG ENC TEXT LTX SZ8 (GLOVE) ×6 IMPLANT
GLOVE SURG UNDER POLY LF SZ6.5 (GLOVE) ×3 IMPLANT
GLOVE SURG UNDER POLY LF SZ7 (GLOVE) ×3 IMPLANT
GLOVE SURG UNDER POLY LF SZ7.5 (GLOVE) ×3 IMPLANT
HANDPIECE INTERPULSE COAX TIP (DISPOSABLE) ×3
KIT BASIN OR (CUSTOM PROCEDURE TRAY) ×3 IMPLANT
KIT TURNOVER KIT A (KITS) ×1 IMPLANT
NEEDLE HYPO 22GX1.5 SAFETY (NEEDLE) ×3 IMPLANT
PACK ORTHO EXTREMITY (CUSTOM PROCEDURE TRAY) ×3 IMPLANT
PENCIL SMOKE EVACUATOR (MISCELLANEOUS) ×1 IMPLANT
PROTECTOR NERVE ULNAR (MISCELLANEOUS) ×3 IMPLANT
SET HNDPC FAN SPRY TIP SCT (DISPOSABLE) ×2 IMPLANT
SOL PREP POV-IOD 4OZ 10% (MISCELLANEOUS) ×3 IMPLANT
STOCKINETTE 6  STRL (DRAPES) ×3
STOCKINETTE 6 STRL (DRAPES) ×2 IMPLANT
SUT ETHILON 2 0 PSLX (SUTURE) ×1 IMPLANT
SUT ETHILON 3 0 PS 1 (SUTURE) ×6 IMPLANT
SUT ETHILON 4 0 PS 2 18 (SUTURE) ×6 IMPLANT
SWAB COLLECTION DEVICE MRSA (MISCELLANEOUS) IMPLANT
SWAB CULTURE ESWAB REG 1ML (MISCELLANEOUS) IMPLANT
SYR CONTROL 10ML LL (SYRINGE) ×3 IMPLANT

## 2021-11-22 NOTE — TOC Progression Note (Signed)
Transition of Care [facility]) - Progression Note    Patient Details  Name: Jason Buchanan MRN: [id number] Date of Birth: Sep 25, 1986  Transition [facility]) CM/SW Contact  Ida Rogue, [place] Phone Number: 11/22/2021, 2:59 PM  Clinical Narrative:   Gave patient the information that his insurance will only cover half of his bill at SNF, and he would need to come up with thousands up front for admission.  He has no money, has no family nor friends who can help him.  He asked about the bill being covered by charity, and I told him that was not an option. Mr Widrig stated he would call the local [employer] congregation to ask for help as that is the denomination to which he belongs.  I asked about back-up plan. He talked about [facility] in [place], or any shelter that will give him a bed.  Stated he was living in a tent here in [place] for the past three months, but he feels like that is no longer an option given his wounds.  Will follow up with patient tomorrow with numbers for [facility], [employer] and local shelters. TOC will continue to follow during the course of hospitalization.     Expected Discharge Plan: Homeless Shelter Barriers to Discharge: Continued Medical Work up  Expected Discharge Plan and Services Expected Discharge Plan: Homeless Shelter   Discharge Planning Services: CM Consult   Living arrangements for the past 2 months: Homeless Shelter Expected Discharge Date: 11/14/21                                     Social Determinants of Health (SDOH) Interventions    Readmission Risk Interventions No flowsheet data found.

## 2021-11-22 NOTE — Progress Notes (Signed)
Patient's bed is wet and refusing to let NT change it after several attempts.

## 2021-11-22 NOTE — TOC Benefit Eligibility Note (Signed)
Transition of Care [facility]) Benefit Eligibility Note    Patient Details  Name: Jason Buchanan MRN: [id number] Date of Birth: 18-Sep-1986      Covered?: Yes        Spoke with Person/Company/Phone Number:: Jessica/ [employer]  Co-Pay: 50 percent coinsurance with a 5,000.00 out of pocket deductable which he has met 2,000.00 and hospital visit should surpass 5,000.00 deductable  Prior Approval: Yes [phone] opt. 3)     Additional Notes: Patient is allowed 90 days    Kerin Salen Phone Number: 11/22/2021, 10:13 AM

## 2021-11-22 NOTE — Progress Notes (Signed)
PROGRESS NOTE    Jason Buchanan  [id number] DOB: 1986/04/02 DOA: 11/14/2021 PCP: [name], NP    Brief Narrative:  Jason Buchanan was admitted to the hospital with the working diagnosis of severe sepsis due to right upper extremities cellulitis, complicated with streptococcus bacteremia.    35 yo male with the past medical history of polysubstance abuse who presented with bilateral arm pain. Patient injecting multiple drugs in both arms. On his initial physical examination his blood pressure was 107/88, HR 113, RR 17, oxygen saturation 100%, lungs clear to auscultation, heart S1 and S2 present and rhythmic, abdomen soft, right upper extremity with edema and erythema, bullous lesions. Left hand with erythema.    Sodium 130, potassium 3.0, chloride 91, bicarb 23, glucose 75, BUN 45, creatinine 1.19, lactic acid 3.5, white count 27.6, hemoglobin 13.9, hematocrit 41.9, platelets 321. SARS COVID-19 negative.   Urine analysis specific gravity 1.018. Toxicology screen positive for amphetamines.   Chest radiograph no infiltrates.   EKG 113 bpm, normal axis, normal intervals, sinus rhythm with poor R wave progression, positive LVH, no significant ST segment or T wave changes.   CT scan of upper extremities showed extensive cellulitis. He was placed on broad-spectrum antibiotic therapy.   12/13 I&D of bilateral upper extremities. Blood culture positive for Streptococcus group B.   12/15 patient developed melena with acute blood loss, hemoglobin down to 5.8.  Patient received 2 units packed red blood cell transfusion and was placed on intravenous pantoprazole. 12/15 upper endoscopy showing mild proximal mid esophagitis.  Nonbleeding gastric ulcers with no stigmata of bleeding.  Positive adherent clot.  Nonbleeding duodenal ulcers with adherent clot.  Ulcers were injected.   He had postprocedure respiratory failure that required invasive mechanical ventilation.   12/15, wound evaluation  of the left upper extremity with dressing change under sedation.  Incision and drainage of right upper extremity and loose closure of wound, right upper extremity.   Extubated late 12/15.   Tolerating well antibiotic therapy and dressing changes.  May requires further I&D depending on character of drainage and most likely require further skin debridement on the right.    Assessment & Plan:   Principal Problem:   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HCC) Active Problems:   Sepsis due to cellulitis (HCC)   Polysubstance abuse (HCC)   Hypokalemia   Streptococcal bacteremia   Right arm cellulitis   Staphylococcus aureus infection   Acute blood loss anemia   Upper GI bleed   Respiratory failure, post-operative (HCC)     Bilateral upper extremities cellulitis, in the setting of IV drug abuse. Streptococcus pyogenes bacteremia. Left hand wound culture positive for staph aureus.  Sp I&D bilateral upper extremities.  Follow up blood cultures with no growth on 12/14.  Wbc is 21 and he has been afebrile.  Wound pain is better controlled with IV hydromorphone and oral oxycodone.  Continue with scheduled acetaminophen   On daptomycin for antibiotic therapy will follow with ID for recommendation in regards of duration    Out of bed to chair as tolerated. Continue with physical and occupational therapy recommendations.  Plan to transfer to SNF to continue care when medically stable.  Plan to remove central line today after getting peripheral IV access.    2. Upper GI bleed with acute blood loss anemia, gastric and duodenal ulcers.  Patient is NPO today for surgical procedure, but he has been tolerating po well.  Continue antiacid therapy with pantoprazole.  3. Hyponatremia, hypokalemia and hypomagnesemia. Severe protein calorie malnutrition.  Renal function with serum cr at 0,46, K is 3,8 and Na is 131.  Continue to monitor renal function and add free  water fluid restriction, Check BMP in 48 hrs.    4. Polysubstance abuse and depression. C On 100 mg bid of seroquel and trazodone with good toleration.  Follow up with psychiatry as outpatient    5. Post operative respiratory failure. Clinically resolved, patient with less than 24 hrs on mechanical ventilation    Patient continue to be at high risk for worsening wound infections   Status is: Inpatient  Remains inpatient appropriate because: IV antibiotic therapy and wound care.   DVT prophylaxis: Enoxaparin   Code Status:    full  Family Communication:   No family at the bedside      Nutrition Status: Nutrition Problem: Increased nutrient needs Etiology: wound healing Signs/Symptoms: estimated needs Interventions: Ensure Enlive (each supplement provides 350kcal and 20 grams of protein), MVI, Prostat     Consultants:  Surgery  ID   Procedures:  As above   Antimicrobials:  Daptomycin     Subjective:  Patient reports improvement in bilateral upper extremities pain, no nausea or vomiting, no dyspnea or chest pain, this am he has been NPO in preparation for surgical procedure.   Objective: Vitals:   11/21/21 1830 11/21/21 2030 11/22/21 0530 11/22/21 1052  BP: (!) 159/71 (!) 152/74 (!) 151/72 (!) 166/69  Pulse: (!) 102 (!) 110 (!) 104 96  Resp: 18 20 18    Temp: 98.4 F (36.9 C) 98.7 F (37.1 C) 98.9 F (37.2 C)   TempSrc: Oral  Oral   SpO2: 100% 100% 100%   Weight:      Height:        Intake/Output Summary (Last 24 hours) at 11/22/2021 1102 Last data filed at 11/22/2021 0700 Gross per 24 hour  Intake 640 ml  Output 800 ml  Net -160 ml   Filed Weights   11/14/21 0835 11/15/21 1608  Weight: 60 kg 60 kg    Examination:   General: Not in pain or dyspnea  Neurology: Awake and alert, non focal  E ENT: mild pallor, no icterus, oral mucosa moist Cardiovascular: No JVD. S1-S2 present, rhythmic, no gallops, rubs, or murmurs. No lower extremity  edema. Pulmonary: positive breath sounds bilaterally, adequate air movement, no wheezing, rhonchi or rales. Gastrointestinal. Abdomen soft and non tender Skin. Bilateral upper extremities wounds with dressing in place, positive non pitting edema.  Musculoskeletal: no joint deformities     Data Reviewed: I have personally reviewed following labs and imaging studies  CBC: Recent Labs  Lab 11/17/21 1352 11/17/21 1733 11/18/21 0308 11/19/21 0300 11/20/21 0502 11/21/21 0448 11/22/21 0352  WBC 59.0*  --  43.1* 36.2* 28.3* 25.8* 21.7*  NEUTROABS 45.7*  --   --   --   --   --   --   HGB 8.5*   < > 7.9* 8.1* 8.2* 8.1* 7.5*  HCT 24.6*   < > 23.3* 24.5* 25.7* 25.0* 23.6*  MCV 83.7  --  85.0 86.9 88.9 89.3 89.7  PLT 317  --  309 351 426* 552* 664*   < > = values in this interval not displayed.   Basic Metabolic Panel: Recent Labs  Lab 11/16/21 0451 11/17/21 0404 11/17/21 1352 11/18/21 0308 11/19/21 0300 11/20/21 0502 11/21/21 0448 11/22/21 0352  NA 133* 132*   < > 131* 131* 131* 129* 131*  K  4.4 4.5   < > 4.1 3.3* 3.8 3.8 3.8  CL 100 105   < > 105 104 105 102 101  CO2 24 21*   < > 20* 21* 21* 22 24  GLUCOSE 126* 108*   < > 107* 90 120* 110* 104*  BUN 40* 37*   < > 28* 16 13 14 15   CREATININE 0.62 0.62   < > 0.65 0.59* 0.53* 0.48* 0.40*  CALCIUM 7.6* 7.0*   < > 7.0* 7.0* 7.2* 7.5* 7.6*  MG 1.6* 1.6*  --  1.9 1.6* 2.1  --   --    < > = values in this interval not displayed.   GFR: Estimated Creatinine Clearance: 109.4 mL/min (A) (by C-G formula based on SCr of 0.4 mg/dL (L)). Liver Function Tests: Recent Labs  Lab 11/17/21 1352  AST 42*  ALT 24  ALKPHOS 78  BILITOT 0.6  PROT 5.2*  ALBUMIN <1.5*   No results for input(s): LIPASE, AMYLASE in the last 168 hours. No results for input(s): AMMONIA in the last 168 hours. Coagulation Profile: Recent Labs  Lab 11/17/21 1325  INR 1.1   Cardiac Enzymes: Recent Labs  Lab 11/18/21 0308  CKTOTAL 65   BNP (last 3  results) No results for input(s): PROBNP in the last 8760 hours. HbA1C: No results for input(s): HGBA1C in the last 72 hours. CBG: Recent Labs  Lab 11/18/21 1158 11/18/21 1558  GLUCAP 160* 85   Lipid Profile: No results for input(s): CHOL, HDL, LDLCALC, TRIG, CHOLHDL, LDLDIRECT in the last 72 hours. Thyroid Function Tests: No results for input(s): TSH, T4TOTAL, FREET4, T3FREE, THYROIDAB in the last 72 hours. Anemia Panel: No results for input(s): VITAMINB12, FOLATE, FERRITIN, TIBC, IRON, RETICCTPCT in the last 72 hours.    Radiology Studies: I have reviewed all of the imaging during this hospital visit personally     Scheduled Meds:  (feeding supplement) PROSource Plus  30 mL Oral BID BM   acetaminophen  650 mg Oral Q6H   vitamin C  500 mg Oral Daily   Chlorhexidine Gluconate Cloth  6 each Topical Daily   feeding supplement  237 mL Oral BID BM   folic acid  1 mg Oral Daily   multivitamin with minerals  1 tablet Oral Daily   pantoprazole  40 mg Intravenous Q12H   QUEtiapine  100 mg Oral BID   thiamine  100 mg Oral Daily   Continuous Infusions:  DAPTOmycin (CUBICIN)  IV 500 mg (11/21/21 2050)     LOS: 8 days        Elanore Talcott 11/23/21, MD

## 2021-11-22 NOTE — Transfer of Care (Signed)
Immediate Anesthesia Transfer of Care Note  Patient: Jason Buchanan  Procedure(s) Performed: IRRIGATION AND DEBRIDEMENT WOUND (Bilateral: Arm Lower) APPLICATION OF WOUND VAC (Right: Arm Lower)  Patient Location: PACU  Anesthesia Type:General  Level of Consciousness: awake, alert  and oriented  Airway & Oxygen Therapy: Patient Spontanous Breathing and Patient connected to face mask oxygen  Post-op Assessment: Report given to RN and Post -op Vital signs reviewed and stable  Post vital signs: Reviewed and stable  Last Vitals:  Vitals Value Taken Time  BP 134/85 11/22/21 2000  Temp    Pulse 123 11/22/21 2001  Resp 14 11/22/21 2001  SpO2 100 % 11/22/21 2001  Vitals shown include unvalidated device data.  Last Pain:  Vitals:   11/22/21 1633  TempSrc:   PainSc: 10-Worst pain ever      Patients Stated Pain Goal: 3 (11/22/21 1345)  Complications: No notable events documented.

## 2021-11-22 NOTE — Anesthesia Preprocedure Evaluation (Signed)
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 with prolonged (>30 min) block after GI procedure)  Airway Mallampati: III  TM Distance: >3 FB Neck ROM: Full    Dental  (+) Poor Dentition, Dental Advisory Given   Pulmonary asthma , Current Smoker,    Pulmonary exam normal        Cardiovascular negative cardio ROS Normal cardiovascular exam     Neuro/Psych negative neurological ROS     GI/Hepatic negative GI ROS, (+)     substance abuse  cocaine use, methamphetamine use and IV drug use,   Endo/Other  negative endocrine ROS  Renal/GU negative Renal ROS  negative genitourinary   Musculoskeletal negative musculoskeletal ROS (+)   Abdominal   Peds  Hematology  (+) anemia ,   Anesthesia Other Findings  Presented with sepsis and BUE cellulitis s/p I&D  Reproductive/Obstetrics                             Anesthesia Physical Anesthesia Plan  ASA: 3  Anesthesia Plan: General   Post-op Pain Management: Toradol IV (intra-op) and Ketamine IV   Induction: Intravenous  PONV Risk Score and Plan: 1 and Ondansetron, Dexamethasone, Midazolam and Treatment may vary due to age or medical condition  Airway Management Planned: LMA  Additional Equipment: None  Intra-op Plan:   Post-operative Plan: Extubation in OR  Informed Consent: I have reviewed the patients History and Physical, chart, labs and discussed the procedure including the risks, benefits and alternatives for the proposed anesthesia with the patient or authorized representative who has indicated his/her understanding and acceptance.      Dental advisory given  Plan Discussed with:   Anesthesia Plan Comments:         Anesthesia Quick Evaluation

## 2021-11-22 NOTE — Anesthesia Postprocedure Evaluation (Signed)
Anesthesia Post Note  Patient: Jason Buchanan  Procedure(s) Performed: IRRIGATION AND DEBRIDEMENT WOUND (Bilateral: Arm Lower) APPLICATION OF WOUND VAC (Right: Arm Lower)     Patient location during evaluation: PACU Anesthesia Type: General Level of consciousness: awake and alert Pain management: pain level controlled Vital Signs Assessment: post-procedure vital signs reviewed and stable Respiratory status: spontaneous breathing, nonlabored ventilation and respiratory function stable Cardiovascular status: blood pressure returned to baseline and stable Postop Assessment: no apparent nausea or vomiting Anesthetic complications: no   No notable events documented.  Last Vitals:  Vitals:   11/22/21 2100 11/22/21 2107  BP: (!) 170/78 (!) 179/71  Pulse: (!) 101 (!) 104  Resp: 11 11  Temp: 37.3 C   SpO2: 98% 99%    Last Pain:  Vitals:   11/22/21 2055  TempSrc:   PainSc: 8    Pain Goal: Patients Stated Pain Goal: 3 (11/22/21 1345)      RLE Motor Response: Purposeful movement (11/22/21 2030)          Lucretia Kern

## 2021-11-22 NOTE — Progress Notes (Signed)
°   11/22/21 2133  Assess: MEWS Score  Temp 98.3 F (36.8 C)  BP (!) 151/84  Pulse Rate (!) 118  Resp 16  Level of Consciousness Alert  SpO2 100 %  O2 Device Room Air  Assess: MEWS Score  MEWS Temp 0  MEWS Systolic 0  MEWS Pulse 2  MEWS RR 0  MEWS LOC 0  MEWS Score 2  MEWS Score Color Yellow  Assess: if the MEWS score is Yellow or Red  Were vital signs taken at a resting state? Yes  Focused Assessment No change from prior assessment  Does the patient meet 2 or more of the SIRS criteria? No  MEWS guidelines implemented *See Row Information* Yes  Treat  Pain Scale 0-10  Pain Score 10  Pain Type Acute pain;Surgical pain  Pain Location Arm  Pain Orientation Right;Left  Pain Descriptors / Indicators Aching;Sharp  Pain Frequency Constant  Pain Onset On-going  Patients Stated Pain Goal 2  Pain Intervention(s) Medication (See eMAR);Elevated extremity  Multiple Pain Sites No  Facial Expression 1  Body Movements 1  Muscle Tension 1  Compliance with ventilator (intubated pts.) N/A  Take Vital Signs  Increase Vital Sign Frequency  Yellow: Q 2hr X 2 then Q 4hr X 2, if remains yellow, continue Q 4hrs  Escalate  MEWS: Escalate Yellow: discuss with charge nurse/RN and consider discussing with provider and RRT  Notify: Charge Nurse/RN  Name of Charge Nurse/RN Notified Brayton Caves RN  Date Charge Nurse/RN Notified 11/22/21  Time Charge Nurse/RN Notified 2135  Document  Patient Outcome Not stable and remains on department  Assess: SIRS CRITERIA  SIRS Temperature  0  SIRS Pulse 1  SIRS Respirations  0  SIRS WBC 0  SIRS Score Sum  1   RN attempting to get on top of pain management. Pt encouraged deep breathing for relaxation.  Pt is sitting up in bed drinking

## 2021-11-22 NOTE — Anesthesia Procedure Notes (Signed)
Procedure Name: LMA Insertion Date/Time: 11/22/2021 7:10 PM Performed by: Kizzie Fantasia, CRNA Pre-anesthesia Checklist: Patient identified, Emergency Drugs available, Suction available, Patient being monitored and Timeout performed Patient Re-evaluated:Patient Re-evaluated prior to induction Oxygen Delivery Method: Circle system utilized Preoxygenation: Pre-oxygenation with 100% oxygen Induction Type: IV induction Ventilation: Mask ventilation without difficulty LMA: LMA inserted LMA Size: 4.0 Number of attempts: 1 Placement Confirmation: positive ETCO2 and breath sounds checked- equal and bilateral Tube secured with: Tape Dental Injury: Teeth and Oropharynx as per pre-operative assessment

## 2021-11-23 ENCOUNTER — Inpatient Hospital Stay: Payer: Self-pay

## 2021-11-23 ENCOUNTER — Encounter (HOSPITAL_COMMUNITY): Payer: Self-pay | Admitting: General Surgery

## 2021-11-23 DIAGNOSIS — R768 Other specified abnormal immunological findings in serum: Secondary | ICD-10-CM

## 2021-11-23 LAB — CBC
HCT: 22.4 % — ABNORMAL LOW (ref 39.0–52.0)
Hemoglobin: 7.1 g/dL — ABNORMAL LOW (ref 13.0–17.0)
MCH: 28.7 pg (ref 26.0–34.0)
MCHC: 31.7 g/dL (ref 30.0–36.0)
MCV: 90.7 fL (ref 80.0–100.0)
Platelets: 794 10*3/uL — ABNORMAL HIGH (ref 150–400)
RBC: 2.47 MIL/uL — ABNORMAL LOW (ref 4.22–5.81)
RDW: 15.5 % (ref 11.5–15.5)
WBC: 19.3 10*3/uL — ABNORMAL HIGH (ref 4.0–10.5)
nRBC: 0 % (ref 0.0–0.2)

## 2021-11-23 LAB — HEPATITIS B SURFACE ANTIBODY, QUANTITATIVE: Hep B S AB Quant (Post): 17.6 m[IU]/mL

## 2021-11-23 MED ORDER — SUCCINYLCHOLINE CHLORIDE 200 MG/10ML IV SOSY
PREFILLED_SYRINGE | INTRAVENOUS | Status: AC
  Filled 2021-11-23: qty 10

## 2021-11-23 MED ORDER — LIDOCAINE 2% (20 MG/ML) 5 ML SYRINGE
INTRAMUSCULAR | Status: AC
  Filled 2021-11-23: qty 20

## 2021-11-23 MED ORDER — PHENYLEPHRINE HCL (PRESSORS) 10 MG/ML IV SOLN
INTRAVENOUS | Status: AC
  Filled 2021-11-23: qty 1

## 2021-11-23 MED ORDER — NICOTINE 14 MG/24HR TD PT24
14.0000 mg | MEDICATED_PATCH | Freq: Every day | TRANSDERMAL | Status: DC
  Administered 2021-11-23 – 2021-12-02 (×10): 14 mg via TRANSDERMAL
  Filled 2021-11-23 (×11): qty 1

## 2021-11-23 MED ORDER — DEXMEDETOMIDINE (PRECEDEX) IN NS 20 MCG/5ML (4 MCG/ML) IV SYRINGE
PREFILLED_SYRINGE | INTRAVENOUS | Status: AC
  Filled 2021-11-23: qty 10

## 2021-11-23 MED ORDER — ROCURONIUM BROMIDE 10 MG/ML (PF) SYRINGE
PREFILLED_SYRINGE | INTRAVENOUS | Status: AC
  Filled 2021-11-23: qty 20

## 2021-11-23 MED ORDER — EPHEDRINE 5 MG/ML INJ
INTRAVENOUS | Status: AC
  Filled 2021-11-23: qty 5

## 2021-11-23 NOTE — Progress Notes (Signed)
PROGRESS NOTE    Jason Buchanan  [id number] DOB: 01-17-1986 DOA: 11/14/2021 PCP: [name], NP    Brief Narrative:  Jason Buchanan was admitted to the hospital with the working diagnosis of severe sepsis due to right upper extremities cellulitis, complicated with streptococcus bacteremia.    35 yo male with the past medical history of polysubstance abuse who presented with bilateral arm pain. Patient injecting multiple drugs in both arms. On his initial physical examination his blood pressure was 107/88, HR 113, RR 17, oxygen saturation 100%, lungs clear to auscultation, heart S1 and S2 present and rhythmic, abdomen soft, right upper extremity with edema and erythema, bullous lesions. Left hand with erythema.    Sodium 130, potassium 3.0, chloride 91, bicarb 23, glucose 75, BUN 45, creatinine 1.19, lactic acid 3.5, white count 27.6, hemoglobin 13.9, hematocrit 41.9, platelets 321. SARS COVID-19 negative.   Urine analysis specific gravity 1.018. Toxicology screen positive for amphetamines.   Chest radiograph no infiltrates.   EKG 113 bpm, normal axis, normal intervals, sinus rhythm with poor R wave progression, positive LVH, no significant ST segment or T wave changes.   CT scan of upper extremities showed extensive cellulitis. He was placed on broad-spectrum antibiotic therapy.   12/13 I&D of bilateral upper extremities. Blood culture positive for Streptococcus group B.   12/15 patient developed melena with acute blood loss, hemoglobin down to 5.8.  Patient received 2 units packed red blood cell transfusion and was placed on intravenous pantoprazole. 12/15 upper endoscopy showing mild proximal mid esophagitis.  Nonbleeding gastric ulcers with no stigmata of bleeding.  Positive adherent clot.  Nonbleeding duodenal ulcers with adherent clot.  Ulcers were injected.   He had postprocedure respiratory failure that required invasive mechanical ventilation.   12/15, wound evaluation  of the left upper extremity with dressing change under sedation.  Incision and drainage of right upper extremity and loose closure of wound, right upper extremity.   Extubated late 12/15.   Tolerating well antibiotic therapy and dressing changes.  May requires further I&D depending on character of drainage and most likely require further skin debridement on the right.   12/20 Incision and drainage and debridement of right upper extremity, incision and drainage of left upper extremity.   Assessment & Plan:   Principal Problem:   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HCC) Active Problems:   Sepsis due to cellulitis (HCC)   Polysubstance abuse (HCC)   Hypokalemia   Streptococcal bacteremia   Right arm cellulitis   Staphylococcus aureus infection   Acute blood loss anemia   Upper GI bleed   Respiratory failure, post-operative (HCC)   Bilateral upper extremities cellulitis, in the setting of IV drug abuse. Streptococcus pyogenes bacteremia. Left hand wound culture positive for staph aureus.  Sp I&D bilateral upper extremities.  Follow up blood cultures with no growth on 12/14.  Pain is better controlled, patient had I&d bilateral extremities yesterday His po intake and appetite are improving.   Will need to define duration of antibiotic therapy, patient will be eventually discharged to SNF.    Continue with daptomycin IV   Plan to get a PICC line and discontinue left central line Internal jugular vein placed on 12/15.    2. Upper GI bleed with acute blood loss anemia, gastric and duodenal ulcers.  Diet now advance to regular with good toleration, no nausea or vomiting, continue antiacid therapy with pantoprazole.    3. Hyponatremia, hypokalemia and hypomagnesemia. Severe protein calorie malnutrition.  Plan to follow up renal function in am, his po intake has improved and he is off maintenance IV fluids.   4. Polysubstance abuse and depression/  tobacco abuse  Continue with 100 mg bid of seroquel and trazodone with good toleration.  Follow up with psychiatry as outpatient   Patient requested nicotine patch    5. Post operative respiratory failure. Clinically resolved, patient with less than 24 hrs on mechanical ventilation      Patient continue to be at high risk for worsening wound infections.   Status is: Inpatient  Remains inpatient appropriate because: IV antibiotic therapy      DVT prophylaxis:  Enoxaparin   Code Status:    full  Family Communication:   No family at the bedside      Nutrition Status: Nutrition Problem: Increased nutrient needs Etiology: wound healing Signs/Symptoms: estimated needs Interventions: Ensure Enlive (each supplement provides 350kcal and 20 grams of protein), MVI, Prostat     Consultants:  Surgery ' ID   Procedures:  As above   Antimicrobials:   Daptomycin     Subjective:  Patient is feeling better, he has decrease pain in his wounds and seems to be better controlled with current analgesics regimen, his po intake and appetite continue to improve   Objective: Vitals:   11/22/21 2333 11/23/21 0123 11/23/21 0530 11/23/21 0817  BP: (!) 154/70 (!) 157/73 127/83 (!) 152/48  Pulse: (!) 109 (!) 128 (!) 118 98  Resp: 20 20 20 16   Temp: 98.8 F (37.1 C) 98.9 F (37.2 C) 98.7 F (37.1 C) 98.8 F (37.1 C)  TempSrc: Oral Oral Oral Oral  SpO2: 100% 100% 100% 100%  Weight:      Height:        Intake/Output Summary (Last 24 hours) at 11/23/2021 1136 Last data filed at 11/23/2021 0600 Gross per 24 hour  Intake 1000 ml  Output 2825 ml  Net -1825 ml   Filed Weights   11/14/21 0835 11/15/21 1608  Weight: 60 kg 60 kg    Examination:   General: Not in pain or dyspnea  Neurology: Awake and alert, non focal  E ENT: mild pallor, no icterus, oral mucosa moist Cardiovascular: No JVD. S1-S2 present, rhythmic, no gallops, rubs, or murmurs. Mild upper extremities non pitting  edema. Pulmonary: positive breath sounds bilaterally, with no wheezing, rhonchi or rales. Gastrointestinal. Abdomen soft and non tender Skin.  Bilateral upper extremities with dressing in place.  Musculoskeletal: no joint deformities     Data Reviewed: I have personally reviewed following labs and imaging studies  CBC: Recent Labs  Lab 11/17/21 1352 11/17/21 1733 11/19/21 0300 11/20/21 0502 11/21/21 0448 11/22/21 0352 11/23/21 0305  WBC 59.0*   < > 36.2* 28.3* 25.8* 21.7* 19.3*  NEUTROABS 45.7*  --   --   --   --   --   --   HGB 8.5*   < > 8.1* 8.2* 8.1* 7.5* 7.1*  HCT 24.6*   < > 24.5* 25.7* 25.0* 23.6* 22.4*  MCV 83.7   < > 86.9 88.9 89.3 89.7 90.7  PLT 317   < > 351 426* 552* 664* 794*   < > = values in this interval not displayed.   Basic Metabolic Panel: Recent Labs  Lab 11/17/21 0404 11/17/21 1352 11/18/21 0308 11/19/21 0300 11/20/21 0502 11/21/21 0448 11/22/21 0352  NA 132*   < > 131* 131* 131* 129* 131*  K 4.5   < > 4.1 3.3* 3.8 3.8 3.8  CL 105   < > 105 104 105 102 101  CO2 21*   < > 20* 21* 21* 22 24  GLUCOSE 108*   < > 107* 90 120* 110* 104*  BUN 37*   < > 28* 16 13 14 15   CREATININE 0.62   < > 0.65 0.59* 0.53* 0.48* 0.40*  CALCIUM 7.0*   < > 7.0* 7.0* 7.2* 7.5* 7.6*  MG 1.6*  --  1.9 1.6* 2.1  --   --    < > = values in this interval not displayed.   GFR: Estimated Creatinine Clearance: 109.4 mL/min (A) (by C-G formula based on SCr of 0.4 mg/dL (L)). Liver Function Tests: Recent Labs  Lab 11/17/21 1352  AST 42*  ALT 24  ALKPHOS 78  BILITOT 0.6  PROT 5.2*  ALBUMIN <1.5*   No results for input(s): LIPASE, AMYLASE in the last 168 hours. No results for input(s): AMMONIA in the last 168 hours. Coagulation Profile: Recent Labs  Lab 11/17/21 1325  INR 1.1   Cardiac Enzymes: Recent Labs  Lab 11/18/21 0308  CKTOTAL 65   BNP (last 3 results) No results for input(s): PROBNP in the last 8760 hours. HbA1C: No results for input(s): HGBA1C  in the last 72 hours. CBG: Recent Labs  Lab 11/18/21 1158 11/18/21 1558  GLUCAP 160* 85   Lipid Profile: No results for input(s): CHOL, HDL, LDLCALC, TRIG, CHOLHDL, LDLDIRECT in the last 72 hours. Thyroid Function Tests: No results for input(s): TSH, T4TOTAL, FREET4, T3FREE, THYROIDAB in the last 72 hours. Anemia Panel: No results for input(s): VITAMINB12, FOLATE, FERRITIN, TIBC, IRON, RETICCTPCT in the last 72 hours.    Radiology Studies: I have reviewed all of the imaging during this hospital visit personally     Scheduled Meds:  (feeding supplement) PROSource Plus  30 mL Oral BID BM   acetaminophen  650 mg Oral Q6H   vitamin C  500 mg Oral Daily   Chlorhexidine Gluconate Cloth  6 each Topical Daily   feeding supplement  237 mL Oral BID BM   folic acid  1 mg Oral Daily   multivitamin with minerals  1 tablet Oral Daily   pantoprazole  40 mg Oral BID   QUEtiapine  100 mg Oral BID   thiamine  100 mg Oral Daily   Continuous Infusions:  DAPTOmycin (CUBICIN)  IV Stopped (11/22/21 1730)     LOS: 9 days        Jayko Voorhees Gerome Apley, MD

## 2021-11-23 NOTE — Progress Notes (Signed)
Pharmacy Antibiotic Note  Jason Buchanan is a 35 y.o. male admitted on 11/14/2021 with bilateral upper extremity wounds now s/p fasciotomy and I&D.  Pharmacy has been consulted for daptomycin dosing.  Today, 11/23/21  - SCr stable at 0.4 - Ck 65 (12/16)  Plan: Continue Daptomycin 8 mg/kg daily  F/U ID plans for antibiotics upon discharge Will f/u renal function weekly CK (next 12/23)  Height: 5\' 7"  (170.2 cm) Weight: 60 kg (132 lb 4.4 oz) IBW/kg (Calculated) : 66.1  Temp (24hrs), Avg:98.8 F (37.1 C), Min:98.3 F (36.8 C), Max:99.2 F (37.3 C)  Recent Labs  Lab 11/17/21 1352 11/18/21 0308 11/19/21 0300 11/20/21 0502 11/21/21 0448 11/22/21 0352 11/23/21 0305  WBC 59.0* 43.1* 36.2* 28.3* 25.8* 21.7* 19.3*  CREATININE 0.64 0.65 0.59* 0.53* 0.48* 0.40*  --   LATICACIDVEN 1.1  --   --   --   --   --   --      Estimated Creatinine Clearance: 109.4 mL/min (A) (by C-G formula based on SCr of 0.4 mg/dL (L)).    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 Had prolonged block (>30 min) after succinylcholine for GI procedure.    11/25/21, PharmD  11/23/2021 7:15 AM

## 2021-11-23 NOTE — Progress Notes (Signed)
Occupational Therapy Treatment Patient Details Name: Jason Buchanan MRN: [id number] DOB: 12-12-1985 Today's Date: 11/23/2021   History of present illness Pt is 35 yo male presented on 12/12 with bil UE edema/erythema after IV drug use.  Pt required R hand fasciotomy, carpal tunnel release, 3 compartment forearm fasciotomy, Guyon's canal release , and I and D hand/forearm on 12/15; and L wrist I and D, left volar forearm fasciotmoy, and L wrist carpal tunnel release on 12/13. On 12/14 concern for GIB and required 3 untis PRBC with ED 12/15.  Pt with hx of polysubstance abuse.   OT comments  Patient declined to participate in sitting EOB or movement in bed reporting pain was too high. Patient was apporached at agreed upon time with pain medication in system. Patient was provided with HEP for LUE and R shoulder to maintain ROM to participate in when pain was reduced. Patient demonstrated understanding. Patient and nursing staff were educated on positioning of BUE to reduce edema. Patient and nursing staff verbalized and demonstrated understanding. Patient would continue to benefit from skilled OT services at this time while admitted and after d/c to address noted deficits in order to improve overall safety and independence in ADLs.     Recommendations for follow up therapy are one component of a multi-disciplinary discharge planning process, led by the attending physician.  Recommendations may be updated based on patient status, additional functional criteria and insurance authorization.    Follow Up Recommendations  Skilled nursing-short term rehab (<3 hours/day)    Assistance Recommended at Discharge Frequent or constant Supervision/Assistance  Equipment Recommendations  Other (comment) (defer to next venue)    Recommendations for Other Services      Precautions / Restrictions Precautions Precautions: Fall Precaution Comments: orthostatic, painful UE, wound vac RUE Restrictions Other  Position/Activity Restrictions: no restrictions on BUE per Charma Igo PA on secure chat 11/21/21       Mobility Bed Mobility               General bed mobility comments: patient declined reporting " i need to get pain under control first"    Transfers                         Balance                                           ADL either performed or assessed with clinical judgement   ADL Overall ADL's : Needs assistance/impaired   Eating/Feeding Details (indicate cue type and reason): patient reported being able to feed himself and get drinks with nursing having set up water bottle for him to encourage independence. patient was able to eat cookie with LUE after set up with MI.                                        Extremity/Trunk Assessment Upper Extremity Assessment RUE Deficits / Details: patient noted to have increased edema in palm on this date with less movement after I & D on 12/20. RUE: Unable to fully assess due to pain LUE Deficits / Details: patient was noted to have improved ROM of elbow and shoulder on this date. patient noted to have increased ROM of digits with ability to tolerate gentle  extension about 5 degrees increased from previous session. patient able to squeeze hand into fist with new dressings in place.            Vision       Perception     Praxis      Cognition Arousal/Alertness: Awake/alert Behavior During Therapy: WFL for tasks assessed/performed Overall Cognitive Status: Within Functional Limits for tasks assessed                                            Exercises Other Exercises Other Exercises: patient was provided with HEP for R shoulder and L shoulder and elbow. patient verbalized and demonstrated understanding during session. patient completed one set with therapist in room. patient was provided with hand out. patient educated that HEP was second to  participation in ADLs. patient verbalized understanding. Other Exercises: patient was provided with washcloth x3 roll to promote gentle extension of digits. patient and nursing were educated on pillow placement and wash cloth roll to reduce edema and promote extension of digits. patient, nurse and NT verbalized understanding.   Shoulder Instructions       General Comments      Pertinent Vitals/ Pain       Pain Assessment: Faces Faces Pain Scale: Hurts whole lot Pain Location: Arms and ribs Pain Descriptors / Indicators: Discomfort;Grimacing;Guarding Pain Intervention(s): Monitored during session;Premedicated before session;Repositioned  Home Living                                          Prior Functioning/Environment              Frequency  Min 2X/week        Progress Toward Goals  OT Goals(current goals can now be found in the care plan section)  Progress towards OT goals: Progressing toward goals     Plan Discharge plan remains appropriate    Co-evaluation                 AM-PAC OT "6 Clicks" Daily Activity     Outcome Measure   Help from another person eating meals?: A Little Help from another person taking care of personal grooming?: A Little Help from another person toileting, which includes using toliet, bedpan, or urinal?: A Lot Help from another person bathing (including washing, rinsing, drying)?: A Lot Help from another person to put on and taking off regular upper body clothing?: A Lot Help from another person to put on and taking off regular lower body clothing?: A Lot 6 Click Score: 14    End of Session    OT Visit Diagnosis: Muscle weakness (generalized) (M62.81);Feeding difficulties (R63.3);Pain   Activity Tolerance Patient tolerated treatment well   Patient Left in bed;with call bell/phone within reach;with bed alarm set   Nurse Communication Other (comment) (positioning of BUE)        Time: [id number] OT  Time Calculation (min): 40 min  Charges: OT General Charges $OT Visit: 1 Visit OT Treatments $Therapeutic Activity: 38-52 mins  Sharyn Blitz OTR/L, MS Acute Rehabilitation Department Office# [phone] Pager# [phone]   Ardyth Harps 11/23/2021, 3:46 PM

## 2021-11-23 NOTE — Progress Notes (Signed)
Subjective: No new complaints   Antibiotics:  Anti-infectives (From admission, onward)    Start     Dose/Rate Route Frequency Ordered Stop   11/17/21 1700  cefTRIAXone (ROCEPHIN) 2 g in sodium chloride 0.9 % 100 mL IVPB  Status:  Discontinued        2 g 200 mL/hr over 30 Minutes Intravenous Every 24 hours 11/17/21 1019 11/21/21 1047   11/17/21 1700  DAPTOmycin (CUBICIN) 500 mg in sodium chloride 0.9 % IVPB        8 mg/kg  60 kg 120 mL/hr over 30 Minutes Intravenous Daily 11/17/21 1555     11/17/21 1200  metroNIDAZOLE (FLAGYL) IVPB 500 mg  Status:  Discontinued        500 mg 100 mL/hr over 60 Minutes Intravenous Every 12 hours 11/17/21 1019 11/18/21 0924   11/15/21 1000  cefTRIAXone (ROCEPHIN) 2 g in sodium chloride 0.9 % 100 mL IVPB  Status:  Discontinued        2 g 200 mL/hr over 30 Minutes Intravenous Every 24 hours 11/14/21 1834 11/15/21 0405   11/15/21 1000  clindamycin (CLEOCIN) IVPB 600 mg  Status:  Discontinued        600 mg 100 mL/hr over 30 Minutes Intravenous Every 8 hours 11/15/21 0405 11/15/21 1523   11/15/21 0800  penicillin G potassium 12 Million Units in dextrose 5 % 500 mL continuous infusion  Status:  Discontinued        12 Million Units 41.7 mL/hr over 12 Hours Intravenous Every 12 hours 11/15/21 0405 11/17/21 1956   11/14/21 2200  vancomycin (VANCOREADY) IVPB 750 mg/150 mL  Status:  Discontinued        750 mg 150 mL/hr over 60 Minutes Intravenous Every 12 hours 11/14/21 1524 11/15/21 0405   11/14/21 0900  vancomycin (VANCOCIN) IVPB 1000 mg/200 mL premix        1,000 mg 200 mL/hr over 60 Minutes Intravenous  Once 11/14/21 0849 11/14/21 1043   11/14/21 0900  cefTRIAXone (ROCEPHIN) 2 g in sodium chloride 0.9 % 100 mL IVPB        2 g 200 mL/hr over 30 Minutes Intravenous  Once 11/14/21 0849 11/14/21 0935       Medications: Scheduled Meds:  (feeding supplement) PROSource Plus  30 mL Oral BID BM   acetaminophen  650 mg Oral Q6H   vitamin C  500  mg Oral Daily   Chlorhexidine Gluconate Cloth  6 each Topical Daily   feeding supplement  237 mL Oral BID BM   folic acid  1 mg Oral Daily   multivitamin with minerals  1 tablet Oral Daily   nicotine  14 mg Transdermal Daily   pantoprazole  40 mg Oral BID   QUEtiapine  100 mg Oral BID   thiamine  100 mg Oral Daily   Continuous Infusions:  DAPTOmycin (CUBICIN)  IV Stopped (11/22/21 1730)   PRN Meds:.cyclobenzaprine, hydrALAZINE, HYDROmorphone (DILAUDID) injection, ondansetron **OR** ondansetron (ZOFRAN) IV, oxyCODONE, senna-docusate, sodium chloride flush, traZODone    Objective: Weight change:   Intake/Output Summary (Last 24 hours) at 11/23/2021 1524 Last data filed at 11/23/2021 0600 Gross per 24 hour  Intake 1000 ml  Output 2825 ml  Net -1825 ml    Blood pressure (!) 145/73, pulse (!) 113, temperature 98.5 F (36.9 C), temperature source Axillary, resp. rate 16, height 5\' 7"  (1.702 m), weight 60 kg, SpO2 100 %. Temp:  [98.3 F (36.8 C)-99.2 F (37.3 C)]  98.5 F (36.9 C) (12/21 1421) Pulse Rate:  [98-128] 113 (12/21 1421) Resp:  [11-20] 16 (12/21 1421) BP: (121-184)/(48-85) 145/73 (12/21 1421) SpO2:  [98 %-100 %] 100 % (12/21 1421)  Physical Exam: Physical Exam Constitutional:      Appearance: He is well-developed. He is cachectic.  HENT:     Head: Normocephalic and atraumatic.  Eyes:     Conjunctiva/sclera: Conjunctivae normal.  Cardiovascular:     Rate and Rhythm: Normal rate and regular rhythm.  Pulmonary:     Effort: Pulmonary effort is normal. No respiratory distress.     Breath sounds: Normal breath sounds. No stridor. No wheezing.  Abdominal:     General: There is no distension.     Palpations: Abdomen is soft.  Musculoskeletal:        General: Normal range of motion.     Cervical back: Normal range of motion and neck supple.  Skin:    General: Skin is warm and dry.     Findings: No erythema or rash.  Neurological:     General: No focal deficit  present.     Mental Status: He is oriented to person, place, and time.  Psychiatric:        Attention and Perception: Attention normal.        Mood and Affect: Mood is depressed.        Speech: Speech is delayed.        Behavior: Behavior normal. Behavior is cooperative.        Thought Content: Thought content normal.        Judgment: Judgment normal.    Bilateral UE bandaged  CBC:    BMET Recent Labs    11/21/21 0448 11/22/21 0352  NA 129* 131*  K 3.8 3.8  CL 102 101  CO2 22 24  GLUCOSE 110* 104*  BUN 14 15  CREATININE 0.48* 0.40*  CALCIUM 7.5* 7.6*      Liver Panel  No results for input(s): PROT, ALBUMIN, AST, ALT, ALKPHOS, BILITOT, BILIDIR, IBILI in the last 72 hours.     Sedimentation Rate No results for input(s): ESRSEDRATE in the last 72 hours. C-Reactive Protein No results for input(s): CRP in the last 72 hours.  Micro Results: Recent Results (from the past 720 hour(s))  Resp Panel by RT-PCR (Flu A&B, Covid) Nasopharyngeal Swab     Status: None   Collection Time: 11/14/21  8:54 AM   Specimen: Nasopharyngeal Swab; Nasopharyngeal(NP) swabs in vial transport medium  Result Value Ref Range Status   SARS Coronavirus 2 by RT PCR NEGATIVE NEGATIVE Final    Comment: (NOTE) SARS-CoV-2 target nucleic acids are NOT DETECTED.  The SARS-CoV-2 RNA is generally detectable in upper respiratory specimens during the acute phase of infection. The lowest concentration of SARS-CoV-2 viral copies this assay can detect is 138 copies/mL. A negative result does not preclude SARS-Cov-2 infection and should not be used as the sole basis for treatment or other patient management decisions. A negative result may occur with  improper specimen collection/handling, submission of specimen other than nasopharyngeal swab, presence of viral mutation(s) within the areas targeted by this assay, and inadequate number of viral copies(<138 copies/mL). A negative result must be combined  with clinical observations, patient history, and epidemiological information. The expected result is Negative.  Fact Sheet for Patients:  BloggerCourse.com  Fact Sheet for Healthcare Providers:  SeriousBroker.it  This test is no t yet approved or cleared by the [place] FDA and  has been  authorized for detection and/or diagnosis of SARS-CoV-2 by FDA under an Emergency Use Authorization (EUA). This EUA will remain  in effect (meaning this test can be used) for the duration of the COVID-19 declaration under Section 564(b)(1) of the Act, 21 U.S.C.section 360bbb-3(b)(1), unless the authorization is terminated  or revoked sooner.       Influenza A by PCR NEGATIVE NEGATIVE Final   Influenza B by PCR NEGATIVE NEGATIVE Final    Comment: (NOTE) The Xpert Xpress SARS-CoV-2/FLU/RSV plus assay is intended as an aid in the diagnosis of influenza from Nasopharyngeal swab specimens and should not be used as a sole basis for treatment. Nasal washings and aspirates are unacceptable for Xpert Xpress SARS-CoV-2/FLU/RSV testing.  Fact Sheet for Patients: EntrepreneurPulse.com.au  Fact Sheet for Healthcare Providers: IncredibleEmployment.be  This test is not yet approved or cleared by the [place] FDA and has been authorized for detection and/or diagnosis of SARS-CoV-2 by FDA under an Emergency Use Authorization (EUA). This EUA will remain in effect (meaning this test can be used) for the duration of the COVID-19 declaration under Section 564(b)(1) of the Act, 21 U.S.C. section 360bbb-3(b)(1), unless the authorization is terminated or revoked.  Performed at [facility], [place] [address] [place], [place] [address]   Blood Culture (routine x 2)     Status: Abnormal   Collection Time: 11/14/21  8:54 AM   Specimen: BLOOD  Result Value Ref Range Status   Specimen Description    Final    BLOOD LEFT ANTECUBITAL Performed at [facility] [address] [place], [place] [address]    Special Requests   Final    BOTTLES DRAWN AEROBIC AND ANAEROBIC Blood Culture results may not be optimal due to an excessive volume of blood received in culture bottles Performed at [facility] [address] [place], [place] [address]    Culture  Setup Time   Final    GRAM POSITIVE COCCI IN CHAINS ANAEROBIC BOTTLE ONLY CRITICAL RESULT CALLED TO, READ BACK BY AND VERIFIED WITH: M LILLISTON,PHARMD@0300  11/15/21 [place]    Culture (A)  Final    STREPTOCOCCUS PYOGENES HEALTH DEPARTMENT NOTIFIED Performed at [facility], [place] [address], [place], [place] [address]    Report Status 11/17/2021 FINAL  Final   Organism ID, Bacteria STREPTOCOCCUS PYOGENES  Final      Susceptibility   Streptococcus pyogenes - MIC*    PENICILLIN <=0.06 SENSITIVE Sensitive     CEFTRIAXONE <=0.12 SENSITIVE Sensitive     ERYTHROMYCIN >=8 RESISTANT Resistant     LEVOFLOXACIN <=0.25 SENSITIVE Sensitive     VANCOMYCIN 0.5 SENSITIVE Sensitive     * STREPTOCOCCUS PYOGENES  Blood Culture ID Panel (Reflexed)     Status: Abnormal   Collection Time: 11/14/21  8:54 AM  Result Value Ref Range Status   Enterococcus faecalis NOT DETECTED NOT DETECTED Final   Enterococcus Faecium NOT DETECTED NOT DETECTED Final   Listeria monocytogenes NOT DETECTED NOT DETECTED Final   Staphylococcus species NOT DETECTED NOT DETECTED Final   Staphylococcus aureus (BCID) NOT DETECTED NOT DETECTED Final   Staphylococcus epidermidis NOT DETECTED NOT DETECTED Final   Staphylococcus lugdunensis NOT DETECTED NOT DETECTED Final   Streptococcus species DETECTED (A) NOT DETECTED Final    Comment: CRITICAL RESULT CALLED TO, READ BACK BY AND VERIFIED WITH: M LILLISTON,PHARMD@0300  11/15/21 [place]    Streptococcus agalactiae NOT DETECTED NOT DETECTED Final   Streptococcus pneumoniae NOT DETECTED NOT  DETECTED Final  Streptococcus pyogenes DETECTED (A) NOT DETECTED Final    Comment: CRITICAL RESULT CALLED TO, READ BACK BY AND VERIFIED WITH: M LILLISTON,PHARMD@0300  11/15/21 [place]    A.calcoaceticus-baumannii NOT DETECTED NOT DETECTED Final   Bacteroides fragilis NOT DETECTED NOT DETECTED Final   Enterobacterales NOT DETECTED NOT DETECTED Final   Enterobacter cloacae complex NOT DETECTED NOT DETECTED Final   Escherichia coli NOT DETECTED NOT DETECTED Final   Klebsiella aerogenes NOT DETECTED NOT DETECTED Final   Klebsiella oxytoca NOT DETECTED NOT DETECTED Final   Klebsiella pneumoniae NOT DETECTED NOT DETECTED Final   Proteus species NOT DETECTED NOT DETECTED Final   Salmonella species NOT DETECTED NOT DETECTED Final   Serratia marcescens NOT DETECTED NOT DETECTED Final   Haemophilus influenzae NOT DETECTED NOT DETECTED Final   Neisseria meningitidis NOT DETECTED NOT DETECTED Final   Pseudomonas aeruginosa NOT DETECTED NOT DETECTED Final   Stenotrophomonas maltophilia NOT DETECTED NOT DETECTED Final   Candida albicans NOT DETECTED NOT DETECTED Final   Candida auris NOT DETECTED NOT DETECTED Final   Candida glabrata NOT DETECTED NOT DETECTED Final   Candida krusei NOT DETECTED NOT DETECTED Final   Candida parapsilosis NOT DETECTED NOT DETECTED Final   Candida tropicalis NOT DETECTED NOT DETECTED Final   Cryptococcus neoformans/gattii NOT DETECTED NOT DETECTED Final    Comment: Performed at [facility], [address] [place], [place] [address]  Blood Culture (routine x 2)     Status: None   Collection Time: 11/14/21  9:54 AM   Specimen: BLOOD  Result Value Ref Range Status   Specimen Description   Final    BLOOD LEFT ARM Performed at [facility] [address] [place], [place] [address]    Special Requests   Final    BOTTLES DRAWN AEROBIC AND ANAEROBIC Blood Culture adequate volume Performed at [place] [address],  [place], [place] [address]    Culture   Final    NO GROWTH 5 DAYS Performed at [facility], [place] [address], [place], [place] [address]    Report Status 11/19/2021 FINAL  Final  Aerobic/Anaerobic Culture w Gram Stain (surgical/deep wound)     Status: None   Collection Time: 11/15/21  5:46 PM   Specimen: PATH Other; Tissue  Result Value Ref Range Status   Specimen Description   Final    WRIST RIGHT Performed at [facility] [address] [place], [place] [address]    Special Requests   Final    NONE Performed at [facility], [place] [address], [place], [place] [address]    Gram Stain   Final    FEW WBC PRESENT, PREDOMINANTLY MONONUCLEAR FEW GRAM POSITIVE COCCI IN PAIRS AND CHAINS    Culture   Final    MODERATE STREPTOCOCCUS PYOGENES Beta hemolytic streptococci are predictably susceptible to penicillin and other beta lactams. Susceptibility testing not routinely performed. RARE STAPHYLOCOCCUS AUREUS SUSCEPTIBILITIES PERFORMED ON PREVIOUS CULTURE WITHIN THE LAST 5 DAYS. NO ANAEROBES ISOLATED Performed at [facility], [place] [address], [place], [place] [address]    Report Status 11/21/2021 FINAL  Final  Aerobic/Anaerobic Culture w Gram Stain (surgical/deep wound)     Status: None   Collection Time: 11/15/21  6:35 PM   Specimen: PATH Other; Tissue  Result Value Ref Range Status   Specimen Description   Final    HAND LEFT DORSAL Performed at [facility] [address] [place],  [place] [address]    Special Requests   Final    NONE Performed at [facility], [address] [place], [place] [address]    Gram Stain   Final    MODERATE WBC PRESENT, PREDOMINANTLY MONONUCLEAR ABUNDANT GRAM POSITIVE COCCI    Culture   Final    ABUNDANT STREPTOCOCCUS PYOGENES Beta hemolytic streptococci are predictably susceptible to penicillin and other beta lactams. Susceptibility testing not routinely  performed. RARE METHICILLIN RESISTANT STAPHYLOCOCCUS AUREUS RARE STREPTOCOCCUS PNEUMONIAE NO ANAEROBES ISOLATED Performed at [facility], [address] [place], [place] [address]    Report Status 11/21/2021 FINAL  Final   Organism ID, Bacteria METHICILLIN RESISTANT STAPHYLOCOCCUS AUREUS  Final   Organism ID, Bacteria STREPTOCOCCUS PNEUMONIAE  Final      Susceptibility   Methicillin resistant staphylococcus aureus - MIC*    CIPROFLOXACIN >=8 RESISTANT Resistant     ERYTHROMYCIN >=8 RESISTANT Resistant     GENTAMICIN <=0.5 SENSITIVE Sensitive     OXACILLIN >=4 RESISTANT Resistant     TETRACYCLINE <=1 SENSITIVE Sensitive     VANCOMYCIN 1 SENSITIVE Sensitive     TRIMETH/SULFA >=320 RESISTANT Resistant     CLINDAMYCIN >=8 RESISTANT Resistant     RIFAMPIN <=0.5 SENSITIVE Sensitive     Inducible Clindamycin NEGATIVE Sensitive     * RARE METHICILLIN RESISTANT STAPHYLOCOCCUS AUREUS   Streptococcus pneumoniae - MIC*    ERYTHROMYCIN >=8 RESISTANT Resistant     LEVOFLOXACIN 0.5 SENSITIVE Sensitive     VANCOMYCIN <=0.12 SENSITIVE Sensitive     PENICILLIN (meningitis) 1 RESISTANT Resistant     PENO - penicillin 1      PENICILLIN (non-meningitis) 1 SENSITIVE Sensitive     PENICILLIN (oral) 1 INTERMEDIATE Intermediate     CEFTRIAXONE (non-meningitis) 1 SENSITIVE Sensitive     CEFTRIAXONE (meningitis) 1 INTERMEDIATE Intermediate     * RARE STREPTOCOCCUS PNEUMONIAE  Culture, blood (Routine X 2) w Reflex to ID Panel     Status: None   Collection Time: 11/16/21  4:51 AM   Specimen: BLOOD  Result Value Ref Range Status   Specimen Description   Final    BLOOD RIGHT FOOT Performed at [facility], [address] [place], [place] [address]    Special Requests   Final    BOTTLES DRAWN AEROBIC ONLY Blood Culture adequate volume Performed at [facility], [address] [place], [place] [address]    Culture   Final    NO GROWTH 5 DAYS Performed at  [facility], [address] [place], [place] [address]    Report Status 11/21/2021 FINAL  Final  Culture, blood (Routine X 2) w Reflex to ID Panel     Status: None   Collection Time: 11/16/21  4:51 AM   Specimen: BLOOD  Result Value Ref Range Status   Specimen Description   Final    BLOOD RIGHT ANKLE Performed at [facility], [address] [place], [place] [address]    Special Requests   Final    BOTTLES DRAWN AEROBIC ONLY Blood Culture adequate volume Performed at [facility], [address] [place], [place] [address]    Culture   Final    NO GROWTH 5 DAYS Performed at [facility], [address] [place], [place] [address]    Report Status 11/21/2021 FINAL  Final  MRSA Next Gen by PCR, Nasal     Status: Abnormal   Collection  Time: 11/17/21 12:54 PM   Specimen: Nasal Mucosa; Nasal Swab  Result Value Ref Range Status   MRSA by PCR Next Gen DETECTED (A) NOT DETECTED Final    Comment: RESULT CALLED TO, READ BACK BY AND VERIFIED WITH: SAWYER,A. RN AT [id number] 11/17/21 MULLINS,T (NOTE) The GeneXpert MRSA Assay (FDA approved for NASAL specimens only), is one component of a comprehensive MRSA colonization surveillance program. It is not intended to diagnose MRSA infection nor to guide or monitor treatment for MRSA infections. Test performance is not FDA approved in patients less than 63 years old. Performed at [facility], [place] [address] [place], [place] [address]     Studies/Results: [place] EKG SITE RITE  Result Date: 11/23/2021 If Site Rite image not attached, placement could not be confirmed due to current cardiac rhythm.     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 He has immunity to HBV and HAV  I will sign off for now  Please call with further questions.    LOS: 9 days   Alcide Evener 11/23/2021, 3:24 PM

## 2021-11-23 NOTE — Op Note (Signed)
NAME: Jason Buchanan, Jason Buchanan MEDICAL RECORD NO: [id number] ACCOUNT NO: [id number] DATE OF BIRTH: March 21, 1986 FACILITY: [facility] LOCATION: [facility] PHYSICIAN: Tulio Facundo C. Izora Ribas, MD  Operative Report   DATE OF PROCEDURE: 11/22/2021  PREOPERATIVE DIAGNOSIS:  Infection of bilateral upper extremities, status post multiple incision and drainages.  POSTOPERATIVE DIAGNOSIS:  Infection of bilateral upper extremities, status post multiple incision and drainages.  PROCEDURE:  1.  Incision and drainage and debridement of right upper extremity. 2.  Incision and drainage of left upper extremity.  ANESTHESIA:  General.    CULTURES:  No cultures.  SPECIMENS:  No specimens.  ESTIMATED BLOOD LOSS:  50 mL   COMPLICATIONS:  No acute complications.  INDICATIONS:  The patient is a 35 year old male who has severe upper extremity infections bilaterally.  He has been drained twice.  He has taken back to the OR for continued purulent drainage bilaterally.  DESCRIPTION OF PROCEDURE:  The patient was taken to the operating room and placed supine on the operating room table.  Anesthesia was administered.  A timeout was performed.  The right upper extremity was addressed first. It was prepped and draped with  Betadine solution.  All of the staples on the arm were removed.  There was obvious purulent drainage throughout the wound and the skin flaps were opened and there was some nonviable muscle.  This was sharply debrided with scissors.  Nonviable skin on the  volar wrist was also excised with a knife and sharp scissors.  This consisted of skin, subcutaneous tissue, muscle and tendon.  Next, the forearm, which was the most involved, was irrigated with 3 liters of saline solution while debriding with a sponge.   Afterwards, it was decided that a VAC dressing would be placed to handle the ongoing drainage and potential need for further muscle debridement.  A VAC dressing was chosen and fashioned to fit the defect and secured in  place.  A good seal was obtained.   Next, the left upper extremity was addressed.  This extremity was prepped and draped in the sterile fashion.  The 2 small incisions, one on the dorsal hand and one on the distal dorsal forearm that were loosely approximated were opened.  There was some  turbid drainage.  There was some nonviable subcutaneous tissue and fascia that were sharply excised with scissors.  The interval between the hand incision and the distal dorsal forearm incision were connected with an incision.  Thorough irrigation of  this wound was then performed.  Hemostasis was controlled with bipolar.  Superficial skin was debrided from much of the dorsal hand and dorsal proximal fingers.  The incision on the volar aspect of the wrist was without erythema and without obvious  infection.  Next, the wound was loosely approximated with 2-0 nylon sutures.  Xeroform was placed over the skin areas and a dressing was placed over the open wound as well as an Ace wrap.  The patient will be taken to recovery room stable.     PAA D: 11/22/2021 7:59:29 pm T: 11/23/2021 2:58:00 am  JOB: [id number]/ [id number]

## 2021-11-24 LAB — BASIC METABOLIC PANEL
Anion gap: 4 — ABNORMAL LOW (ref 5–15)
BUN: 15 mg/dL (ref 6–20)
CO2: 27 mmol/L (ref 22–32)
Calcium: 7.6 mg/dL — ABNORMAL LOW (ref 8.9–10.3)
Chloride: 100 mmol/L (ref 98–111)
Creatinine, Ser: 0.42 mg/dL — ABNORMAL LOW (ref 0.61–1.24)
GFR, Estimated: >60 mL/min (ref >60)
Glucose, Bld: 113 mg/dL — ABNORMAL HIGH (ref 70–99)
Potassium: 3.9 mmol/L (ref 3.5–5.1)
Sodium: 131 mmol/L — ABNORMAL LOW (ref 135–145)

## 2021-11-24 LAB — CBC
HCT: 20.5 % — ABNORMAL LOW (ref 39.0–52.0)
Hemoglobin: 6.5 g/dL — CL (ref 13.0–17.0)
MCH: 28.5 pg (ref 26.0–34.0)
MCHC: 31.7 g/dL (ref 30.0–36.0)
MCV: 89.9 fL (ref 80.0–100.0)
Platelets: 888 10*3/uL — ABNORMAL HIGH (ref 150–400)
RBC: 2.28 MIL/uL — ABNORMAL LOW (ref 4.22–5.81)
RDW: 15.6 % — ABNORMAL HIGH (ref 11.5–15.5)
WBC: 12.6 10*3/uL — ABNORMAL HIGH (ref 4.0–10.5)
nRBC: 0 % (ref 0.0–0.2)

## 2021-11-24 LAB — PREPARE RBC (CROSSMATCH)

## 2021-11-24 MED ORDER — SODIUM CHLORIDE 0.9% IV SOLUTION: Freq: Once | INTRAVENOUS | Status: AC

## 2021-11-24 NOTE — TOC Benefit Eligibility Note (Signed)
Transition of Care [facility]) Benefit Eligibility Note    Patient Details  Name: Jason Buchanan MRN: [id number] Date of Birth: 05/11/1986   Medication/Dose: Zyvox not on formulary suggested Rx is Linezolid 614m BID for 30 days  Covered?: Yes     Prescription Coverage Preferred Pharmacy: local  Spoke with Person/Company/Phone Number:: Robin/ [facility] [employer] [phone] Co-Pay: $47.00  Prior Approval: No  Deductible: Met  Additional Notes: Patient is allowed 90 days    FKerin SalenPhone Number: 11/24/2021, 12:39 PM

## 2021-11-24 NOTE — Progress Notes (Addendum)
° °      CROSS COVER NOTE  NAME: Jason Buchanan MRN: [id number] DOB : 1986/10/03  Secure chat received from RN reporting hemoglobin of 6.5 down from 7.1 yesterday.  Patient scoped on 12/15 and per RN report has continued to have black tarry stools.  No other signs of active bleeding.  Patient is asymptomatic and hemodynamically stable. 1 unit PRBCs ordered. Continue twice daily pantoprazole.   "12/15 patient developed melena with acute blood loss, hemoglobin down to 5.8.  Patient received 2 units packed red blood cell transfusion and was placed on intravenous pantoprazole. 12/15 upper endoscopy showing mild proximal mid esophagitis.  Nonbleeding gastric ulcers with no stigmata of bleeding.  Positive adherent clot.  Nonbleeding duodenal ulcers with adherent clot.  Ulcers were injected."    Bishop Limbo MHA, MSN, FNP-BC Nurse Practitioner [facility]

## 2021-11-24 NOTE — Progress Notes (Signed)
PT Cancellation Note  Patient Details Name: Jason Buchanan MRN: [id number] DOB: 1986/09/24   Cancelled Treatment:    Reason Eval/Treat Not Completed: Medical issues which prohibited therapy (Hemoglobin (6.5) and Hematocrit (20.5) levels trending down and below therapeutic threshold for participation in PT at this time. PT will follow up as schedule allows.)   Lyman Speller., PT, DPT  Acute Rehabilitation Services  Office [phone]  11/24/2021, 1:55 PM

## 2021-11-24 NOTE — Progress Notes (Signed)
Patient refused left arm dressing change for this nurse.  Oncoming nurse made aware.

## 2021-11-24 NOTE — Plan of Care (Signed)

## 2021-11-24 NOTE — Progress Notes (Signed)
PROGRESS NOTE    Jason Buchanan  [id number] DOB: August 25, 1986 DOA: 11/14/2021 PCP: [name], NP    Brief Narrative:  Jason Buchanan was admitted to the hospital with the working diagnosis of severe sepsis due to right upper extremities cellulitis, complicated with streptococcus bacteremia.    35 yo male with the past medical history of polysubstance abuse who presented with bilateral arm pain. Patient injecting multiple drugs in both arms. On his initial physical examination his blood pressure was 107/88, HR 113, RR 17, oxygen saturation 100%, lungs clear to auscultation, heart S1 and S2 present and rhythmic, abdomen soft, right upper extremity with edema and erythema, bullous lesions. Left hand with erythema.    Sodium 130, potassium 3.0, chloride 91, bicarb 23, glucose 75, BUN 45, creatinine 1.19, lactic acid 3.5, white count 27.6, hemoglobin 13.9, hematocrit 41.9, platelets 321. SARS COVID-19 negative.   Urine analysis specific gravity 1.018. Toxicology screen positive for amphetamines.   Chest radiograph no infiltrates.   EKG 113 bpm, normal axis, normal intervals, sinus rhythm with poor R wave progression, positive LVH, no significant ST segment or T wave changes.   CT scan of upper extremities showed extensive cellulitis. He was placed on broad-spectrum antibiotic therapy.   12/13 I&D of bilateral upper extremities. Blood culture positive for Streptococcus group B.   12/15 patient developed melena with acute blood loss, hemoglobin down to 5.8.  Patient received 2 units packed red blood cell transfusion and was placed on intravenous pantoprazole. 12/15 upper endoscopy showing mild proximal mid esophagitis.  Nonbleeding gastric ulcers with no stigmata of bleeding.  Positive adherent clot.  Nonbleeding duodenal ulcers with adherent clot.  Ulcers were injected.   He had postprocedure respiratory failure that required invasive mechanical ventilation.   12/15, wound evaluation  of the left upper extremity with dressing change under sedation.  Incision and drainage of right upper extremity and loose closure of wound, right upper extremity.   Extubated late 12/15.   Tolerating well antibiotic therapy and dressing changes.  May requires further I&D depending on character of drainage and most likely require further skin debridement on the right.    12/20 Incision and drainage and debridement of right upper extremity, incision and drainage of left upper extremity.   Not able to place PICC line because bilateral upper extremity infections, high risk for line infection IV team not able to get a peripheral IV.   Assessment & Plan:   Principal Problem:   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HCC) Active Problems:   Sepsis due to cellulitis (HCC)   Polysubstance abuse (HCC)   Hypokalemia   Streptococcal bacteremia   Right arm cellulitis   Staphylococcus aureus infection   Acute blood loss anemia   Upper GI bleed   Respiratory failure, post-operative (HCC)   Hepatitis C 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   Bilateral upper extremities cellulitis, in the setting of IV drug abuse. Streptococcus pyogenes bacteremia. Left hand wound culture positive for staph aureus.  Sp I&D bilateral upper extremities.  Follow up blood cultures with no growth on 12/14.   Patient had debridement, noted necrotic muscle with pus. Plan to continue with daptomycin IV during his hospitalization and change to oral linezold for 30 days more. Continue pain control with oxycodone and IV hydromorphone    2. Upper GI bleed with acute blood loss anemia, gastric and duodenal ulcers.  Appetite continue to improve, continue with pantoprazole    3. Hyponatremia, hypokalemia and hypomagnesemia. Severe protein calorie malnutrition.  Stable renal function with serum cr at 0,42, K is 3,9 and Na is 131.  Continue close monitoring of electrolytes, patient is tolerating po well.    4. Polysubstance abuse and depression/ tobacco abuse  On 100 mg bid of seroquel and trazodone with good toleration.  Follow up with psychiatry as outpatient    Patient requested nicotine patch    5. Post operative respiratory failure. Clinically resolved, patient with less than 24 hrs on mechanical ventilation   6. Anemia, likely blood loss from extensive upper extremities wounds. Thrombocytosis  Hgb this am is 6,5 with Hct at 20,5 and Plt 888. Plan to transfuse PRBC 1 unit and follow up cell count and iron panel.   Patient continue to be at high risk for worsening wound infection   Status is: Inpatient  Remains inpatient appropriate because: IV antibiotic therapy and wound care   DVT prophylaxis:  Enoxaparin   Code Status:    full  Family Communication:   No family at the bedside    Nutrition Status: Nutrition Problem: Increased nutrient needs Etiology: wound healing Signs/Symptoms: estimated needs Interventions: Ensure Enlive (each supplement provides 350kcal and 20 grams of protein), MVI, Prostat     Skin Documentation:     Consultants:  ID  Surgery   Procedures:  As above   Antimicrobials:   Daptomycin     Subjective: Patient with bilateral upper extremity pain, that improved with analgesics, no nausea or vomiting, and tolerating po well.   Objective: Vitals:   11/24/21 0703 11/24/21 0850 11/24/21 1258 11/24/21 1454  BP: (!) 157/68 (!) 156/71 (!) 175/69 (!) 142/69  Pulse: (!) 107 (!) 110 (!) 105 (!) 108  Resp: 18 18 (!) 24 20  Temp: 98.4 F (36.9 C) 98.4 F (36.9 C) 97.7 F (36.5 C) 98 F (36.7 C)  TempSrc: Oral Oral Oral Oral  SpO2: 100% 99% 93% 100%  Weight:      Height:        Intake/Output Summary (Last 24 hours) at 11/24/2021 1535 Last data filed at 11/24/2021 1500 Gross per 24 hour  Intake 3294.8 ml  Output 3800 ml  Net -505.2 ml   Filed Weights   11/14/21 0835 11/15/21 1608  Weight: 60 kg 60 kg    Examination:   General:  Not in pain or dyspnea, deconditioned  Neurology: Awake and alert, non focal  E ENT: no pallor, no icterus, oral mucosa moist Cardiovascular: No JVD. S1-S2 present, rhythmic, Pulmonary: positive breath sounds bilaterally, Gastrointestinal. Abdomen soft and non tender Skin. Bilateral upper extremities wounds with dressing in place.  Musculoskeletal: no joint deformities     Data Reviewed: I have personally reviewed following labs and imaging studies  CBC: Recent Labs  Lab 11/20/21 0502 11/21/21 0448 11/22/21 0352 11/23/21 0305 11/24/21 0318  WBC 28.3* 25.8* 21.7* 19.3* 12.6*  HGB 8.2* 8.1* 7.5* 7.1* 6.5*  HCT 25.7* 25.0* 23.6* 22.4* 20.5*  MCV 88.9 89.3 89.7 90.7 89.9  PLT 426* 552* 664* 794* [id number]*   Basic Metabolic Panel: Recent Labs  Lab 11/18/21 0308 11/19/21 0300 11/20/21 0502 11/21/21 0448 11/22/21 0352 11/24/21 0318  NA 131* 131* 131* 129* 131* 131*  K 4.1 3.3* 3.8 3.8 3.8 3.9  CL 105 104 105 102 101 100  CO2 20* 21* 21* 22 24 27   GLUCOSE 107* 90 120* 110* 104* 113*  BUN 28* 16 13 14 15 15   CREATININE 0.65 0.59* 0.53* 0.48* 0.40* 0.42*  CALCIUM 7.0* 7.0* 7.2* 7.5* 7.6* 7.6*  MG 1.9 1.6* 2.1  --   --   --    GFR: Estimated Creatinine Clearance: 109.4 mL/min (A) (by C-G formula based on SCr of 0.42 mg/dL (L)). Liver Function Tests: No results for input(s): AST, ALT, ALKPHOS, BILITOT, PROT, ALBUMIN in the last 168 hours. No results for input(s): LIPASE, AMYLASE in the last 168 hours. No results for input(s): AMMONIA in the last 168 hours. Coagulation Profile: No results for input(s): INR, PROTIME in the last 168 hours. Cardiac Enzymes: Recent Labs  Lab 11/18/21 0308  CKTOTAL 65   BNP (last 3 results) No results for input(s): PROBNP in the last 8760 hours. HbA1C: No results for input(s): HGBA1C in the last 72 hours. CBG: Recent Labs  Lab 11/18/21 1158 11/18/21 1558  GLUCAP 160* 85   Lipid Profile: No results for input(s): CHOL, HDL, LDLCALC,  TRIG, CHOLHDL, LDLDIRECT in the last 72 hours. Thyroid Function Tests: No results for input(s): TSH, T4TOTAL, FREET4, T3FREE, THYROIDAB in the last 72 hours. Anemia Panel: No results for input(s): VITAMINB12, FOLATE, FERRITIN, TIBC, IRON, RETICCTPCT in the last 72 hours.    Radiology Studies: I have reviewed all of the imaging during this hospital visit personally     Scheduled Meds:  (feeding supplement) PROSource Plus  30 mL Oral BID BM   acetaminophen  650 mg Oral Q6H   vitamin C  500 mg Oral Daily   Chlorhexidine Gluconate Cloth  6 each Topical Daily   feeding supplement  237 mL Oral BID BM   folic acid  1 mg Oral Daily   multivitamin with minerals  1 tablet Oral Daily   nicotine  14 mg Transdermal Daily   pantoprazole  40 mg Oral BID   QUEtiapine  100 mg Oral BID   thiamine  100 mg Oral Daily   Continuous Infusions:  DAPTOmycin (CUBICIN)  IV 500 mg (11/23/21 2044)     LOS: 10 days        Mikey Maffett Annett Gula, MD

## 2021-11-24 NOTE — TOC Progression Note (Addendum)
Transition of Care [facility]) - Progression Note    Patient Details  Name: Jason Buchanan MRN: [id number] Date of Birth: 11-08-1986  Transition of Care [facility]) CM/SW Contact  Ida Rogue, [place] Phone Number: 11/24/2021, 11:45 AM  Clinical Narrative:   In consultation with supervisor, addressed several barriers to d/c today.  Sent a message to financial navigators Leamon and Tamarra Geiselman [place] to ask if patient could be screened for disability.  Asked for benefits check on [employer]. Confirmed that PT will see him today and asked them to make sure to assess ambulation, as well as inquiring as to whether patient could be referred to mobility specialists for more help with on-going mobility. TOC will continue to follow during the course of hospitalization.  Addendum: Response from FN is as follows:  the patient has insurance coverage, we and [facility] only handle self pay patients or Medicare recipients. The patient can apply for disability at the social security office if the patient believes he will have a disabling condition.      Expected Discharge Plan: Homeless Shelter Barriers to Discharge: Continued Medical Work up  Expected Discharge Plan and Services Expected Discharge Plan: Homeless Shelter   Discharge Planning Services: CM Consult   Living arrangements for the past 2 months: Homeless Shelter Expected Discharge Date: 11/14/21                                     Social Determinants of Health (SDOH) Interventions    Readmission Risk Interventions No flowsheet data found.

## 2021-11-25 ENCOUNTER — Encounter (HOSPITAL_COMMUNITY): Payer: Self-pay | Admitting: Internal Medicine

## 2021-11-25 ENCOUNTER — Inpatient Hospital Stay (HOSPITAL_COMMUNITY): Payer: 59 | Admitting: Anesthesiology

## 2021-11-25 ENCOUNTER — Other Ambulatory Visit: Payer: Self-pay

## 2021-11-25 ENCOUNTER — Encounter (HOSPITAL_COMMUNITY)
Admission: EM | Disposition: A | Discharge status: Home / Self Care | Payer: Self-pay | Source: Home / Self Care | Attending: Internal Medicine

## 2021-11-25 DIAGNOSIS — R768 Other specified abnormal immunological findings in serum: Secondary | ICD-10-CM | POA: Diagnosis not present

## 2021-11-25 DIAGNOSIS — F15951 Other stimulant use, unspecified with stimulant-induced psychotic disorder with hallucinations: Secondary | ICD-10-CM | POA: Diagnosis not present

## 2021-11-25 DIAGNOSIS — D62 Acute posthemorrhagic anemia: Secondary | ICD-10-CM | POA: Diagnosis not present

## 2021-11-25 DIAGNOSIS — L03113 Cellulitis of right upper limb: Secondary | ICD-10-CM | POA: Diagnosis not present

## 2021-11-25 DIAGNOSIS — E43 Unspecified severe protein-calorie malnutrition: Secondary | ICD-10-CM | POA: Insufficient documentation

## 2021-11-25 HISTORY — PX: INCISION AND DRAINAGE OF WOUND: SHX1803

## 2021-11-25 HISTORY — PX: APPLICATION OF WOUND VAC: SHX5189

## 2021-11-25 LAB — IRON AND TIBC
Iron: 13 ug/dL — ABNORMAL LOW (ref 45–182)
Saturation Ratios: 5 % — ABNORMAL LOW (ref 17.9–39.5)
TIBC: 268 ug/dL (ref 250–450)
UIBC: 255 ug/dL

## 2021-11-25 LAB — HCV RNA QUANT RFLX ULTRA OR GENOTYP
HCV RNA Qnt(log copy/mL): 5.994 log10 IU/mL
HepC Qn: 987000 IU/mL

## 2021-11-25 LAB — TYPE AND SCREEN
ABO/RH(D): A POS
Antibody Screen: NEGATIVE
Unit division: 0

## 2021-11-25 LAB — HEPATITIS C GENOTYPE: Hepatitis C Genotype: 3

## 2021-11-25 LAB — BPAM RBC
Blood Product Expiration Date: 202301162359
ISSUE DATE / TIME: 202212220633
Unit Type and Rh: 6200

## 2021-11-25 LAB — HEMOGLOBIN AND HEMATOCRIT, BLOOD
HCT: 24.6 % — ABNORMAL LOW (ref 39.0–52.0)
Hemoglobin: 7.9 g/dL — ABNORMAL LOW (ref 13.0–17.0)

## 2021-11-25 LAB — TRANSFERRIN: Transferrin: 191 mg/dL (ref 180–329)

## 2021-11-25 LAB — FERRITIN: Ferritin: 58 ng/mL (ref 24–336)

## 2021-11-25 LAB — CK: Total CK: 18 U/L — ABNORMAL LOW (ref 49–397)

## 2021-11-25 SURGERY — APPLICATION, WOUND VAC
Anesthesia: General | Site: Arm Lower | Laterality: Right

## 2021-11-25 MED ORDER — FENTANYL CITRATE PF 50 MCG/ML IJ SOSY
PREFILLED_SYRINGE | INTRAMUSCULAR | Status: AC
  Administered 2021-11-25: 16:00:00 25 ug via INTRAVENOUS
  Filled 2021-11-25: qty 1

## 2021-11-25 MED ORDER — HYDROMORPHONE HCL 1 MG/ML IJ SOLN
1.0000 mg | Freq: Once | INTRAMUSCULAR | Status: AC
  Administered 2021-11-25: 11:00:00 1 mg via INTRAVENOUS
  Filled 2021-11-25: qty 1

## 2021-11-25 MED ORDER — AMISULPRIDE (ANTIEMETIC) 5 MG/2ML IV SOLN: 10.0000 mg | Freq: Once | INTRAVENOUS | Status: DC | PRN

## 2021-11-25 MED ORDER — FENTANYL CITRATE (PF) 250 MCG/5ML IJ SOLN
INTRAMUSCULAR | Status: AC
  Filled 2021-11-25: qty 5

## 2021-11-25 MED ORDER — DEXAMETHASONE SODIUM PHOSPHATE 4 MG/ML IJ SOLN
INTRAMUSCULAR | Status: DC | PRN
  Administered 2021-11-25: 4 mg via INTRAVENOUS

## 2021-11-25 MED ORDER — PROSOURCE PLUS PO LIQD
30.0000 mL | Freq: Three times a day (TID) | ORAL | Status: DC
  Administered 2021-11-25 – 2022-01-17 (×119): 30 mL via ORAL
  Filled 2021-11-25 (×116): qty 30

## 2021-11-25 MED ORDER — PROPOFOL 10 MG/ML IV BOLUS
INTRAVENOUS | Status: DC | PRN
  Administered 2021-11-25: 170 mg via INTRAVENOUS

## 2021-11-25 MED ORDER — PROPOFOL 10 MG/ML IV BOLUS
INTRAVENOUS | Status: AC
  Filled 2021-11-25: qty 20

## 2021-11-25 MED ORDER — KETAMINE HCL 10 MG/ML IJ SOLN
INTRAMUSCULAR | Status: DC | PRN
  Administered 2021-11-25 (×2): 15 mg via INTRAVENOUS

## 2021-11-25 MED ORDER — MIDAZOLAM HCL 5 MG/5ML IJ SOLN
INTRAMUSCULAR | Status: DC | PRN
  Administered 2021-11-25: 2 mg via INTRAVENOUS

## 2021-11-25 MED ORDER — FENTANYL CITRATE (PF) 100 MCG/2ML IJ SOLN
INTRAMUSCULAR | Status: DC | PRN
  Administered 2021-11-25 (×2): 50 ug via INTRAVENOUS

## 2021-11-25 MED ORDER — IRRISEPT - 450ML BOTTLE WITH 0.05% CHG IN STERILE WATER, USP 99.95% OPTIME
TOPICAL | Status: DC | PRN
  Administered 2021-11-25: 15:00:00 450 mL

## 2021-11-25 MED ORDER — FENTANYL CITRATE PF 50 MCG/ML IJ SOSY
25.0000 ug | PREFILLED_SYRINGE | INTRAMUSCULAR | Status: DC | PRN
  Administered 2021-11-25: 16:00:00 25 ug via INTRAVENOUS

## 2021-11-25 MED ORDER — ONDANSETRON HCL 4 MG/2ML IJ SOLN
INTRAMUSCULAR | Status: DC | PRN
  Administered 2021-11-25: 4 mg via INTRAVENOUS

## 2021-11-25 MED ORDER — PROMETHAZINE HCL 25 MG/ML IJ SOLN: 6.2500 mg | INTRAMUSCULAR | Status: DC | PRN

## 2021-11-25 MED ORDER — BUPIVACAINE HCL (PF) 0.5 % IJ SOLN
INTRAMUSCULAR | Status: AC
  Filled 2021-11-25: qty 30

## 2021-11-25 MED ORDER — LIDOCAINE 2% (20 MG/ML) 5 ML SYRINGE
INTRAMUSCULAR | Status: DC | PRN
  Administered 2021-11-25: 100 mg via INTRAVENOUS

## 2021-11-25 MED ORDER — LACTATED RINGERS IV SOLN: INTRAVENOUS | Status: DC | PRN

## 2021-11-25 MED ORDER — FERROUS SULFATE 325 (65 FE) MG PO TABS
325.0000 mg | ORAL_TABLET | Freq: Every day | ORAL | Status: DC
  Administered 2021-11-26 – 2022-01-17 (×48): 325 mg via ORAL
  Filled 2021-11-25 (×48): qty 1

## 2021-11-25 MED ORDER — KETAMINE HCL 50 MG/5ML IJ SOSY
PREFILLED_SYRINGE | INTRAMUSCULAR | Status: AC
  Filled 2021-11-25: qty 5

## 2021-11-25 MED ORDER — 0.9 % SODIUM CHLORIDE (POUR BTL) OPTIME
TOPICAL | Status: DC | PRN
  Administered 2021-11-25: 15:00:00 1000 mL

## 2021-11-25 MED ORDER — MIDAZOLAM HCL 2 MG/2ML IJ SOLN
INTRAMUSCULAR | Status: AC
  Filled 2021-11-25: qty 2

## 2021-11-25 SURGICAL SUPPLY — 35 items
BAG COUNTER SPONGE SURGICOUNT (BAG) IMPLANT
BAG ZIPLOCK 12X15 (MISCELLANEOUS) ×3 IMPLANT
BNDG ELASTIC 4X5.8 VLCR STR LF (GAUZE/BANDAGES/DRESSINGS) ×2 IMPLANT
BNDG GAUZE ELAST 4 BULKY (GAUZE/BANDAGES/DRESSINGS) ×3 IMPLANT
CORD BIPOLAR FORCEPS 12FT (ELECTRODE) ×3 IMPLANT
COVER SURGICAL LIGHT HANDLE (MISCELLANEOUS) ×3 IMPLANT
CUFF TOURN SGL QUICK 18X4 (TOURNIQUET CUFF) ×3 IMPLANT
CUFF TOURN SGL QUICK 24 (TOURNIQUET CUFF)
CUFF TRNQT CYL 24X4X16.5-23 (TOURNIQUET CUFF) IMPLANT
DRAIN PENROSE 0.5X18 (DRAIN) IMPLANT
DRAPE SURG 17X11 SM STRL (DRAPES) ×6 IMPLANT
DRSG PAD ABDOMINAL 8X10 ST (GAUZE/BANDAGES/DRESSINGS) ×1 IMPLANT
ELECT REM PT RETURN 15FT ADLT (MISCELLANEOUS) ×3 IMPLANT
GAUZE SPONGE 4X4 12PLY STRL (GAUZE/BANDAGES/DRESSINGS) ×3 IMPLANT
GAUZE XEROFORM 1X8 LF (GAUZE/BANDAGES/DRESSINGS) ×1 IMPLANT
GLOVE SURG ENC TEXT LTX SZ8 (GLOVE) ×6 IMPLANT
GLOVE SURG UNDER POLY LF SZ6.5 (GLOVE) ×3 IMPLANT
GLOVE SURG UNDER POLY LF SZ7 (GLOVE) ×3 IMPLANT
GLOVE SURG UNDER POLY LF SZ7.5 (GLOVE) ×3 IMPLANT
HANDPIECE INTERPULSE COAX TIP (DISPOSABLE) ×3
KIT BASIN OR (CUSTOM PROCEDURE TRAY) ×3 IMPLANT
KIT TURNOVER KIT A (KITS) IMPLANT
NEEDLE HYPO 22GX1.5 SAFETY (NEEDLE) ×3 IMPLANT
NS IRRIG 1000ML POUR BTL (IV SOLUTION) ×3 IMPLANT
PACK ORTHO EXTREMITY (CUSTOM PROCEDURE TRAY) ×3 IMPLANT
PENCIL SMOKE EVACUATOR (MISCELLANEOUS) IMPLANT
PROTECTOR NERVE ULNAR (MISCELLANEOUS) ×3 IMPLANT
SET HNDPC FAN SPRY TIP SCT (DISPOSABLE) ×2 IMPLANT
STOCKINETTE 6  STRL (DRAPES) ×3
STOCKINETTE 6 STRL (DRAPES) ×2 IMPLANT
SUT ETHILON 3 0 PS 1 (SUTURE) ×6 IMPLANT
SUT ETHILON 4 0 PS 2 18 (SUTURE) ×6 IMPLANT
SWAB COLLECTION DEVICE MRSA (MISCELLANEOUS) IMPLANT
SWAB CULTURE ESWAB REG 1ML (MISCELLANEOUS) IMPLANT
SYR CONTROL 10ML LL (SYRINGE) ×3 IMPLANT

## 2021-11-25 NOTE — Progress Notes (Signed)
Nutrition Follow-up  INTERVENTION:   -Ensure Enlive po BID, each supplement provides 350 kcal and 20 grams of protein   -Multivitamin with minerals daily  -Prosource Plus PO TID, each provides 100 kcals and 15g protein  NUTRITION DIAGNOSIS:   Severe Malnutrition related to acute illness, wound healing as evidenced by severe fat depletion, severe muscle depletion.  GOAL:   Patient will meet greater than or equal to 90% of their needs  Not meeting, NPO for frequent procedures  MONITOR:   PO intake, Supplement acceptance, Labs, Weight trends, I & O's, Skin  ASSESSMENT:   35 year old man with history of Polysubstance abuse who presented to the hospital with arm swelling, erythema and pain. Patient is a poor historian but  reports history of injecting multiple drugs in both arms and currently receiving treatment for severe sepsis of right upper extremities complicated with streptococcus bacteremia.  Patient in room, NPO today for repeat I&D.  Pt states he was not eating well PTA for "months"- this is likely d/t substance abuse.  Pt likes Ensure when he is not NPO and is willing try Prosource supplements.   Pt states his UBW is ~145 lbs.  Admission weight: 132 lbs.  Medications: Vitamin C  Labs reviewed:  Low Na  NUTRITION - FOCUSED PHYSICAL EXAM:  Flowsheet Row Most Recent Value  Orbital Region Severe depletion  Upper Arm Region Unable to assess  [in bandages]  Thoracic and Lumbar Region Severe depletion  Buccal Region Severe depletion  Temple Region Severe depletion  Clavicle Bone Region Severe depletion  Clavicle and Acromion Bone Region Severe depletion  Scapular Bone Region Severe depletion  Dorsal Hand Unable to assess  [in bandages]  Patellar Region Unable to assess  Anterior Thigh Region Unable to assess  Posterior Calf Region Unable to assess  Edema (RD Assessment) None  Hair Reviewed  Eyes Reviewed  Mouth Reviewed  [poor dentition]  Skin Reviewed        Diet Order:   Diet Order             Diet NPO time specified  Diet effective midnight                   EDUCATION NEEDS:   Education needs have been addressed  Skin:  Skin Assessment: Skin Integrity Issues: Skin Integrity Issues:: Incisions Incisions: 12/13: bilateral arms  12/15: right arm 12/20 bilateral arms  Last BM:  12/21 -type 5  Height:   Ht Readings from Last 1 Encounters:  11/15/21 5\' 7"  (1.702 m)    Weight:   Wt Readings from Last 1 Encounters:  11/15/21 60 kg    BMI:  Body mass index is 20.72 kg/m.  Estimated Nutritional Needs:   Kcal:  1800-2000  Protein:  95-110g  Fluid:  2L/day  11/17/21, MS, RD, LDN Inpatient Clinical Dietitian Contact information available via Am[employer]

## 2021-11-25 NOTE — Progress Notes (Signed)
Patient returned to unit from procedure.

## 2021-11-25 NOTE — Progress Notes (Signed)
PROGRESS NOTE    Jason Buchanan  IOE:[id number] DOB: [birth year] DOA: 11/14/2021 PCP: Lavinia Sharps, NP    Brief Narrative:  Jason Buchanan was admitted to the hospital with the working diagnosis of severe sepsis due to right upper extremities cellulitis, complicated with streptococcus bacteremia.    35 yo male with the past medical history of polysubstance abuse who presented with bilateral arm pain. Patient injecting multiple drugs in both arms. On his initial physical examination his blood pressure was 107/88, HR 113, RR 17, oxygen saturation 100%, lungs clear to auscultation, heart S1 and S2 present and rhythmic, abdomen soft, right upper extremity with edema and erythema, bullous lesions. Left hand with erythema.    Sodium 130, potassium 3.0, chloride 91, bicarb 23, glucose 75, BUN 45, creatinine 1.19, lactic acid 3.5, white count 27.6, hemoglobin 13.9, hematocrit 41.9, platelets 321. SARS COVID-19 negative.   Urine analysis specific gravity 1.018. Toxicology screen positive for amphetamines.   Chest radiograph no infiltrates.   EKG 113 bpm, normal axis, normal intervals, sinus rhythm with poor R wave progression, positive LVH, no significant ST segment or T wave changes.   CT scan of upper extremities showed extensive cellulitis. He was placed on broad-spectrum antibiotic therapy.   12/13 I&D of bilateral upper extremities. Blood culture positive for Streptococcus group B.   12/15 patient developed melena with acute blood loss, hemoglobin down to 5.8.  Patient received 2 units packed red blood cell transfusion and was placed on intravenous pantoprazole. 12/15 upper endoscopy showing mild proximal mid esophagitis.  Nonbleeding gastric ulcers with no stigmata of bleeding.  Positive adherent clot.  Nonbleeding duodenal ulcers with adherent clot.  Ulcers were injected.   He had postprocedure respiratory failure that required invasive mechanical ventilation.   12/15, wound evaluation  of the left upper extremity with dressing change under sedation.  Incision and drainage of right upper extremity and loose closure of wound, right upper extremity.   Extubated late 12/15.   Tolerating well antibiotic therapy and dressing changes.  May requires further I&D depending on character of drainage and most likely require further skin debridement on the right.    12/20 Incision and drainage and debridement of right upper extremity, incision and drainage of left upper extremity.    Not able to place PICC line because bilateral upper extremity infections, high risk for line infection IV team not able to get a peripheral IV.      Assessment & Plan:   Principal Problem:   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HCC) Active Problems:   Sepsis due to cellulitis (HCC)   Polysubstance abuse (HCC)   Hypokalemia   Streptococcal bacteremia   Right arm cellulitis   Staphylococcus aureus infection   Acute blood loss anemia   Upper GI bleed   Respiratory failure, post-operative (HCC)   Hepatitis C 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   Bilateral upper extremities cellulitis, in the setting of IV drug abuse. Streptococcus pyogenes bacteremia. Left hand wound culture positive for staph aureus.  Sp I&D bilateral upper extremities.  Follow up blood cultures with no growth on 12/14.    Patient had debridement, noted necrotic muscle with pus. Plan to continue with daptomycin IV during his hospitalization and change to oral linezold for 30 days more. Pain controlled with oxycodone and IV hydromorphone  Ok to give extra dose of hydromorphone when doing physical therapy or at the time of dressing changes.    Wbc is down to 12,6. Follow up cell count in 48  hrs   2. Upper GI bleed with acute blood loss anemia, gastric and duodenal ulcers.  Good appetite, continue with pantoprazole.    3. Hyponatremia, hypokalemia and hypomagnesemia. Severe protein calorie malnutrition.   Follow up renal function in 48 hrs.  Patient has been tolerating po well  4. Polysubstance abuse and depression/ tobacco abuse  On 100 mg bid of seroquel and trazodone with good toleration.  Follow up with psychiatry as outpatient    Continue with nicotine patch.    5. Post operative respiratory failure. Clinically resolved, patient with less than 24 hrs on mechanical ventilation    6. Anemia, likely blood loss from extensive upper extremities wounds. Thrombocytosis  Sp 1 unit PRBC transfusion.  Follow up hgb is 7,9 with hct at 24,6 Serum iron is 13, TIBC 268, transferrin saturation 5 and ferritin of 58.  Will start on oral iron supplementation, hold on IV iron for now due to current systemic infection.      Patient continue to be at high risk for worsening wounds infection   Status is: Inpatient  Remains inpatient appropriate because: IV antibiotics      DVT prophylaxis:  Enoxaparin   Code Status:    full  Family Communication:   No family at the bedside      Nutrition Status: Nutrition Problem: Increased nutrient needs Etiology: wound healing Signs/Symptoms: estimated needs Interventions: Ensure Enlive (each supplement provides 350kcal and 20 grams of protein), MVI, Prostat    Consultants:  Surgery ' ID   Procedures:  As above   Antimicrobials:   Daptomycin     Subjective: Patient with improved pain control, but continue to need extra dose with dressing changes or PT. No nausea or vomiting and tolerating po well.   Objective: Vitals:   11/24/21 1258 11/24/21 1454 11/24/21 1912 11/25/21 0517  BP: (!) 175/69 (!) 142/69 (!) 178/93 (!) 151/65  Pulse: (!) 105 (!) 108 100 (!) 108  Resp: (!) 24 20 (!) 22 18  Temp: 97.7 F (36.5 C) 98 F (36.7 C) 97.8 F (36.6 C) 98 F (36.7 C)  TempSrc: Oral Oral Axillary Oral  SpO2: 93% 100% 100% 96%  Weight:      Height:        Intake/Output Summary (Last 24 hours) at 11/25/2021 1143 Last data filed at  11/25/2021 0940 Gross per 24 hour  Intake 900 ml  Output 3000 ml  Net -2100 ml   Filed Weights   11/14/21 0835 11/15/21 1608  Weight: 60 kg 60 kg    Examination:   General: Not in pain or dyspnea, deconditioned  Neurology: Awake and alert, non focal  E ENT: no pallor, no icterus, oral mucosa moist Cardiovascular: No JVD. S1-S2 present, rhythmic, no gallops, rubs, or murmurs. No lower extremity edema. Pulmonary: positive breath sounds bilaterally, with no wheezing, rhonchi or rales. Gastrointestinal. Abdomen soft and non tender Skin. Bilateral upper extremities wounds with dressing in place.  Musculoskeletal: no joint deformities     Data Reviewed: I have personally reviewed following labs and imaging studies  CBC: Recent Labs  Lab 11/20/21 0502 11/21/21 0448 11/22/21 0352 11/23/21 0305 11/24/21 0318 11/25/21 0337  WBC 28.3* 25.8* 21.7* 19.3* 12.6*  --   HGB 8.2* 8.1* 7.5* 7.1* 6.5* 7.9*  HCT 25.7* 25.0* 23.6* 22.4* 20.5* 24.6*  MCV 88.9 89.3 89.7 90.7 89.9  --   PLT 426* 552* 664* 794* 888*  --    Basic Metabolic Panel: Recent Labs  Lab 11/19/21 0300 11/20/21  0502 11/21/21 0448 11/22/21 0352 11/24/21 0318  NA 131* 131* 129* 131* 131*  K 3.3* 3.8 3.8 3.8 3.9  CL 104 105 102 101 100  CO2 21* 21* 22 24 27   GLUCOSE 90 120* 110* 104* 113*  BUN 16 13 14 15 15   CREATININE 0.59* 0.53* 0.48* 0.40* 0.42*  CALCIUM 7.0* 7.2* 7.5* 7.6* 7.6*  MG 1.6* 2.1  --   --   --    GFR: Estimated Creatinine Clearance: 109.4 mL/min (A) (by C-G formula based on SCr of 0.42 mg/dL (L)). Liver Function Tests: No results for input(s): AST, ALT, ALKPHOS, BILITOT, PROT, ALBUMIN in the last 168 hours. No results for input(s): LIPASE, AMYLASE in the last 168 hours. No results for input(s): AMMONIA in the last 168 hours. Coagulation Profile: No results for input(s): INR, PROTIME in the last 168 hours. Cardiac Enzymes: Recent Labs  Lab 11/25/21 0337  CKTOTAL 18*   BNP (last 3  results) No results for input(s): PROBNP in the last 8760 hours. HbA1C: No results for input(s): HGBA1C in the last 72 hours. CBG: Recent Labs  Lab 11/18/21 1158 11/18/21 1558  GLUCAP 160* 85   Lipid Profile: No results for input(s): CHOL, HDL, LDLCALC, TRIG, CHOLHDL, LDLDIRECT in the last 72 hours. Thyroid Function Tests: No results for input(s): TSH, T4TOTAL, FREET4, T3FREE, THYROIDAB in the last 72 hours. Anemia Panel: Recent Labs    11/25/21 0337  FERRITIN 58  TIBC 268  IRON 13*      Radiology Studies: I have reviewed all of the imaging during this hospital visit personally     Scheduled Meds:  (feeding supplement) PROSource Plus  30 mL Oral TID BM   acetaminophen  650 mg Oral Q6H   vitamin C  500 mg Oral Daily   Chlorhexidine Gluconate Cloth  6 each Topical Daily   feeding supplement  237 mL Oral BID BM   folic acid  1 mg Oral Daily   multivitamin with minerals  1 tablet Oral Daily   nicotine  14 mg Transdermal Daily   pantoprazole  40 mg Oral BID   QUEtiapine  100 mg Oral BID   thiamine  100 mg Oral Daily   Continuous Infusions:  DAPTOmycin (CUBICIN)  IV 500 mg (11/24/21 1955)     LOS: 11 days        Antanisha Mohs Gerome Apley, MD

## 2021-11-25 NOTE — Progress Notes (Signed)
Physical Therapy Treatment Patient Details Name: Jason Buchanan MRN: [id number] DOB: 19-Apr-1986 Today's Date: 11/25/2021   History of Present Illness Pt is 35 yo male presented on 12/12 with bil UE edema/erythema after IV drug use.  Pt required R hand fasciotomy, carpal tunnel release, 3 compartment forearm fasciotomy, Guyon's canal release , and I and D hand/forearm on 12/15; and L wrist I and D, left volar forearm fasciotmoy, and L wrist carpal tunnel release on 12/13. On 12/14 concern for GIB and required 3 untis PRBC with ED 12/15.  Pt with hx of polysubstance abuse.    PT Comments    Patient is making gradual progress and was able to ambulate in room with min assist to steady. Pt reproted slight dizziness with sit<>stand but it improved gradually throughout activity. Pt ambulated in room and to bathroom for BM. Pt noted to have small amount bright red blood in toilet and RN notified. EOS returned to bed and pt requesting pain medication. Acute PT will continue to follow and progress pt as able. Recommend SNF follow up for rehab.    Recommendations for follow up therapy are one component of a multi-disciplinary discharge planning process, led by the attending physician.  Recommendations may be updated based on patient status, additional functional criteria and insurance authorization.  Follow Up Recommendations  Skilled nursing-short term rehab (<3 hours/day)     Assistance Recommended at Discharge Frequent or constant Supervision/Assistance  Equipment Recommendations  Other (comment) (TBA)    Recommendations for Other Services       Precautions / Restrictions Precautions Precautions: Fall Precaution Comments: orthostatic, painful UE, wound vac RUE Restrictions Other Position/Activity Restrictions: no restrictions on BUE per Charma Igo PA on secure chat 11/21/21     Mobility  Bed Mobility Overal bed mobility: Needs Assistance Bed Mobility: Supine to Sit;Sit to Supine      Supine to sit: Min guard;HOB elevated (at 50 degrees) Sit to supine: Min guard   General bed mobility comments: guarding for safety, pt using momentum to swing LE's to EOB and raise body. no LOB, cues for safety.    Transfers Overall transfer level: Needs assistance Equipment used: 1 person hand held assist Transfers: Sit to/from Stand Sit to Stand: Min assist;Min guard           General transfer comment: Min gaurd with light steadying on rise, pt completed sit<>stand 3x from EOB with no UE use and 1x form toilet.    Ambulation/Gait Ambulation/Gait assistance: Min assist;Min guard Gait Distance (Feet): 35 Feet Assistive device: 1 person hand held assist;None Gait Pattern/deviations: Step-through pattern;Decreased stride length;Drifts right/left Gait velocity: decr     General Gait Details: pt ambulated around bed and seated rest provided. No c/o increased dizziness. Pt able to ambulate to bathroom and void for BM then back to bed.   Stairs             Wheelchair Mobility    Modified Rankin (Stroke Patients Only)       Balance Overall balance assessment: Needs assistance Sitting-balance support: No upper extremity supported Sitting balance-Leahy Scale: Fair     Standing balance support: No upper extremity supported Standing balance-Leahy Scale: Fair                              Cognition Arousal/Alertness: Awake/alert Behavior During Therapy: WFL for tasks assessed/performed Overall Cognitive Status: Within Functional Limits for tasks assessed  Exercises      General Comments        Pertinent Vitals/Pain Pain Assessment: Faces Faces Pain Scale: Hurts even more Pain Location: Arms and ribs Pain Descriptors / Indicators: Discomfort;Grimacing;Guarding Pain Intervention(s): Limited activity within patient's tolerance;Monitored during session;Repositioned;Premedicated before  session;Patient requesting pain meds-RN notified    Home Living                          Prior Function            PT Goals (current goals can now be found in the care plan section) Acute Rehab PT Goals PT Goal Formulation: With patient Time For Goal Achievement: 12/05/21 Potential to Achieve Goals: Good Progress towards PT goals: Progressing toward goals    Frequency    Min 2X/week      PT Plan Current plan remains appropriate    Co-evaluation              AM-PAC PT "6 Clicks" Mobility   Outcome Measure  Help needed turning from your back to your side while in a flat bed without using bedrails?: A Little Help needed moving from lying on your back to sitting on the side of a flat bed without using bedrails?: A Little Help needed moving to and from a bed to a chair (including a wheelchair)?: A Little Help needed standing up from a chair using your arms (e.g., wheelchair or bedside chair)?: A Little Help needed to walk in hospital room?: A Little Help needed climbing 3-5 steps with a railing? : Total 6 Click Score: 16    End of Session Equipment Utilized During Treatment: Gait belt Activity Tolerance: Patient tolerated treatment well Patient left: in bed;with call bell/phone within reach;with bed alarm set Nurse Communication: Mobility status PT Visit Diagnosis: Other abnormalities of gait and mobility (R26.89);Muscle weakness (generalized) (M62.81);Pain Pain - part of body: Arm     Time: 1610-9604 PT Time Calculation (min) (ACUTE ONLY): 30 min  Charges:  $Gait Training: 8-22 mins $Therapeutic Activity: 8-22 mins                     Wynn Maudlin, DPT Acute Rehabilitation Services Office [phone] Pager [phone]    Anitra Lauth 11/25/2021, 11:08 AM

## 2021-11-25 NOTE — Anesthesia Procedure Notes (Signed)
Procedure Name: LMA Insertion Date/Time: 11/25/2021 3:13 PM Performed by: Nelle Don, CRNA Pre-anesthesia Checklist: Patient identified, Patient being monitored, Suction available and Emergency Drugs available Patient Re-evaluated:Patient Re-evaluated prior to induction Oxygen Delivery Method: Circle system utilized Preoxygenation: Pre-oxygenation with 100% oxygen Induction Type: IV induction LMA: LMA inserted LMA Size: 4.0 Number of attempts: 1 Dental Injury: Teeth and Oropharynx as per pre-operative assessment

## 2021-11-25 NOTE — Anesthesia Postprocedure Evaluation (Signed)
Anesthesia Post Note  Patient: Jason Buchanan  Procedure(s) Performed: APPLICATION OF WOUND VAC (Right) BILATERAL IRRIGATION AND DEBRIDEMENT BILATERAL ARMS (Bilateral: Arm Lower)     Patient location during evaluation: PACU Anesthesia Type: General Level of consciousness: sedated Pain management: pain level controlled Vital Signs Assessment: post-procedure vital signs reviewed and stable Respiratory status: spontaneous breathing and respiratory function stable Cardiovascular status: stable Postop Assessment: no apparent nausea or vomiting Anesthetic complications: no   No notable events documented.  Last Vitals:  Vitals:   11/25/21 1630 11/25/21 1657  BP: 127/78 (!) 161/84  Pulse: (!) 101 96  Resp: 14 14  Temp: 36.7 C 36.4 C  SpO2: 100%     Last Pain:  Vitals:   11/25/21 1630  TempSrc:   PainSc: (P) Asleep                 Eryn Krejci DANIEL

## 2021-11-25 NOTE — Transfer of Care (Signed)
Immediate Anesthesia Transfer of Care Note  Patient: CONSTANTINOS KREMPASKY  Procedure(s) Performed: APPLICATION OF WOUND VAC (Right) BILATERAL IRRIGATION AND DEBRIDEMENT BILATERAL ARMS (Bilateral: Arm Lower)  Patient Location: PACU  Anesthesia Type:General  Level of Consciousness: drowsy  Airway & Oxygen Therapy: Patient Spontanous Breathing and Patient connected to face mask oxygen  Post-op Assessment: Report given to RN, Post -op Vital signs reviewed and stable and Patient moving all extremities X 4  Post vital signs: Reviewed and stable  Last Vitals:  Vitals Value Taken Time  BP 121/88 11/25/21 1555  Temp    Pulse 109 11/25/21 1556  Resp 14 11/25/21 1556  SpO2 100 % 11/25/21 1556  Vitals shown include unvalidated device data.  Last Pain:  Vitals:   11/25/21 1342  TempSrc:   PainSc: 6       Patients Stated Pain Goal: 6 (11/25/21 1342)  Complications: No notable events documented.

## 2021-11-25 NOTE — Anesthesia Preprocedure Evaluation (Addendum)
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 with prolonged (>30 min) block after GI procedure)  Airway Mallampati: III  TM Distance: >3 FB Neck ROM: Full    Dental  (+) Poor Dentition, Dental Advisory Given   Pulmonary asthma , Current Smoker,    Pulmonary exam normal        Cardiovascular negative cardio ROS Normal cardiovascular exam     Neuro/Psych negative neurological ROS     GI/Hepatic negative GI ROS, (+)     substance abuse  cocaine use, methamphetamine use and IV drug use,   Endo/Other  negative endocrine ROS  Renal/GU negative Renal ROS  negative genitourinary   Musculoskeletal negative musculoskeletal ROS (+)   Abdominal   Peds  Hematology  (+) anemia ,   Anesthesia Other Findings  Presented with sepsis and BUE cellulitis s/p I&D  Reproductive/Obstetrics                            Anesthesia Physical  Anesthesia Plan  ASA: 3  Anesthesia Plan: General   Post-op Pain Management: Toradol IV (intra-op) and Ketamine IV   Induction: Intravenous  PONV Risk Score and Plan: 1 and Ondansetron, Dexamethasone, Midazolam and Treatment may vary due to age or medical condition  Airway Management Planned: LMA  Additional Equipment: None  Intra-op Plan:   Post-operative Plan: Extubation in OR  Informed Consent: I have reviewed the patients History and Physical, chart, labs and discussed the procedure including the risks, benefits and alternatives for the proposed anesthesia with the patient or authorized representative who has indicated his/her understanding and acceptance.      Dental advisory given  Plan Discussed with: Anesthesiologist and CRNA  Anesthesia Plan Comments:        Anesthesia Quick Evaluation

## 2021-11-26 ENCOUNTER — Encounter (HOSPITAL_COMMUNITY): Payer: Self-pay | Admitting: General Surgery

## 2021-11-26 DIAGNOSIS — D62 Acute posthemorrhagic anemia: Secondary | ICD-10-CM | POA: Diagnosis not present

## 2021-11-26 DIAGNOSIS — E43 Unspecified severe protein-calorie malnutrition: Secondary | ICD-10-CM

## 2021-11-26 DIAGNOSIS — E876 Hypokalemia: Secondary | ICD-10-CM | POA: Diagnosis not present

## 2021-11-26 DIAGNOSIS — F15951 Other stimulant use, unspecified with stimulant-induced psychotic disorder with hallucinations: Secondary | ICD-10-CM | POA: Diagnosis not present

## 2021-11-26 DIAGNOSIS — L03113 Cellulitis of right upper limb: Secondary | ICD-10-CM | POA: Diagnosis not present

## 2021-11-26 MED ORDER — HYDROMORPHONE HCL 1 MG/ML IJ SOLN
2.0000 mg | INTRAMUSCULAR | Status: DC | PRN
  Administered 2021-11-26 – 2021-12-11 (×143): 2 mg via INTRAVENOUS
  Filled 2021-11-26 (×147): qty 2

## 2021-11-26 NOTE — Progress Notes (Signed)
pt stating pain is uncontrolled in  RUE with q2h 1mg  Dilaudid, tylenol and oxycodone 10 mg on board. Informed on call. Continue to monitor and assess.

## 2021-11-26 NOTE — Progress Notes (Signed)
Pharmacy Antibiotic Note  Jason Buchanan is a 35 y.o. male admitted on 11/14/2021 with bilateral upper extremity wounds now s/p fasciotomy and I&D.  Pharmacy has been consulted for daptomycin dosing.  Today, 11/26/21  - SCr stable at 0.42 - Ck 65 (12/16), 18 (12/23)  Plan: Continue Daptomycin 8 mg/kg daily as inpatient ID plans for antibiotics upon discharge rec 30 days Zyvox 600mg  bid Will f/u renal function weekly CK (next 12/30)  Height: 5\' 7"  (170.2 cm) Weight: 60 kg (132 lb 4.4 oz) IBW/kg (Calculated) : 66.1  Temp (24hrs), Avg:98 F (36.7 C), Min:97.6 F (36.4 C), Max:98.5 F (36.9 C)  Recent Labs  Lab 11/20/21 0502 11/21/21 0448 11/22/21 0352 11/23/21 0305 11/24/21 0318  WBC 28.3* 25.8* 21.7* 19.3* 12.6*  CREATININE 0.53* 0.48* 0.40*  --  0.42*     Estimated Creatinine Clearance: 109.4 mL/min (A) (by C-G formula based on SCr of 0.42 mg/dL (L)).    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 Had prolonged block (>30 min) after succinylcholine for GI procedure.     11/25/21 PharmD [facility] Rx [phone] 11/26/2021, 8:36 AM

## 2021-11-26 NOTE — Progress Notes (Signed)
Jason Buchanan  [id number] DOB: 06/04/1986 DOA: 11/14/2021 PCP: [name], NP    Brief Narrative:  [name] was admitted to the hospital with the working diagnosis of severe sepsis due to right upper extremities cellulitis, complicated with streptococcus bacteremia.    35 yo male with the past medical history of polysubstance abuse who presented with bilateral arm pain. Patient injecting multiple drugs in both arms. On his initial physical examination his blood pressure was 107/88, HR 113, RR 17, oxygen saturation 100%, lungs clear to auscultation, heart S1 and S2 present and rhythmic, abdomen soft, right upper extremity with edema and erythema, bullous lesions. Left hand with erythema.    Sodium 130, potassium 3.0, chloride 91, bicarb 23, glucose 75, BUN 45, creatinine 1.19, lactic acid 3.5, white count 27.6, hemoglobin 13.9, hematocrit 41.9, platelets 321. SARS COVID-19 negative.   Urine analysis specific gravity 1.018. Toxicology screen positive for amphetamines.   Chest radiograph no infiltrates.   EKG 113 bpm, normal axis, normal intervals, sinus rhythm with poor R wave progression, positive LVH, no significant ST segment or T wave changes.   CT scan of upper extremities showed extensive cellulitis. He was placed on broad-spectrum antibiotic therapy.   12/13 I&D of bilateral upper extremities. Blood culture positive for Streptococcus group B.   12/15 patient developed melena with acute blood loss, hemoglobin down to 5.8.  Patient received 2 units packed red blood cell transfusion and was placed on intravenous pantoprazole. 12/15 upper endoscopy showing mild proximal mid esophagitis.  Nonbleeding gastric ulcers with no stigmata of bleeding.  Positive adherent clot.  Nonbleeding duodenal ulcers with adherent clot.  Ulcers were injected.   He had postprocedure respiratory failure that required invasive mechanical ventilation.   12/15, wound evaluation  of the left upper extremity with dressing change under sedation.  Incision and drainage of right upper extremity and loose closure of wound, right upper extremity.   Extubated late 12/15.   Tolerating well antibiotic therapy and dressing changes.  May requires further I&D depending on character of drainage and most likely require further skin debridement on the right.    12/20 Incision and drainage and debridement of right upper extremity, incision and drainage of left upper extremity.    Not able to place PICC line because bilateral upper extremity infections, high risk for line infection IV team not able to get a peripheral IV.    12/23 bilateral arms I&D, the OR under anesthesia.   Assessment & Plan:   Principal Problem:   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HCC) Active Problems:   Sepsis due to cellulitis (HCC)   Polysubstance abuse (HCC)   Hypokalemia   Streptococcal bacteremia   Right arm cellulitis   Staphylococcus aureus infection   Acute blood loss anemia   Upper GI bleed   Respiratory failure, post-operative (HCC)   Hepatitis C 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   Protein-calorie malnutrition, severe   Bilateral upper extremities cellulitis, in the setting of IV drug abuse. Streptococcus pyogenes bacteremia. Left hand wound culture positive for staph aureus.  Sp I&D bilateral upper extremities.  Follow up blood cultures with no growth on 12/14.    Patient had debridement, noted necrotic muscle with pus. Plan to continue with daptomycin IV during his hospitalization and change to oral linezold for 30 days more.  Patient had I&D bilateral arms yesterday, he complains of bilateral upper extremity pain.  Not able to sleep.  Per nursing report pain has been severe  10/10 in intensity.   Plan to continue antibiotic therapy with daptomycin IV while hospitalized and will change to oral linezolid for 30 more days.  Duration of hospitalization will  depend on surgical interventions needed.  Plan to to be transfer to SNF to continue care.   Pain control with IV hydromorphone (increase to 2 mg) and oral oxycodone as needed.  Scheduled acetaminophen  As needed cyclobenzaprine    2. Upper GI bleed with acute blood loss anemia, gastric and duodenal ulcers.  On pantoprazole.  Avoid non steroidal anti-inflammatory agents.    3. Hyponatremia, hypokalemia and hypomagnesemia. Severe protein calorie malnutrition.  Plan to check renal function and electrolytes in am,    4. Polysubstance abuse and depression/ tobacco abuse  Continue with 100 mg bid of seroquel and trazodone with good toleration.  Follow up with psychiatry as outpatient    Nicotine patch.    5. Post operative respiratory failure. Clinically resolved, patient with less than 24 hrs on mechanical ventilation    6. Anemia, likely blood loss from extensive upper extremities wounds. Thrombocytosis Iron deficiency anemia.  Sp 1 unit PRBC transfusion.  Serum iron is 13, TIBC 268, transferrin saturation 5 and ferritin of 58.  Holding on IV iron for now due to current systemic infection.   Continue with oral iron supplementation   Patient continue to be at high risk for worsening wound infection   Status is: Inpatient  Remains inpatient appropriate because: IV antibiotic therapy and surgical interventions,      DVT prophylaxis:  Enoxaparin   Code Status:    full  Family Communication:   No family at the bedside      Nutrition Status: Nutrition Problem: Severe Malnutrition Etiology: acute illness, wound healing Signs/Symptoms: severe fat depletion, severe muscle depletion Interventions: Prostat, MVI, Ensure Enlive (each supplement provides 350kcal and 20 grams of protein)     Consultants:  Surgery  ID  Procedures:   As above   Antimicrobials:   IV daptomycin     Subjective: Patient resting in bed, but complains of persistent upper extremity pain, no nausea  or vomiting and he has been tolerating po well.  Per nursing his pain has been severe in intensity   Objective: Vitals:   11/25/21 1645 11/25/21 1657 11/25/21 1957 11/26/21 1315  BP: 129/75 (!) 161/84 (!) 159/79 (!) 196/125  Pulse: 98 96 (!) 109 (!) 106  Resp: (!) 26 14 20 20   Temp: 97.8 F (36.6 C) 97.6 F (36.4 C) 98.5 F (36.9 C) 98.6 F (37 C)  TempSrc:   Oral Oral  SpO2: 100%  98% 100%  Weight:      Height:        Intake/Output Summary (Last 24 hours) at 11/26/2021 1351 Last data filed at 11/26/2021 0649 Gross per 24 hour  Intake 300 ml  Output 1020 ml  Net -720 ml   Filed Weights   11/14/21 0835 11/15/21 1608 11/25/21 1342  Weight: 60 kg 60 kg 60 kg    Examination:   General: Not in pain or dyspnea. Deconditioned, not in pain,.  Neurology: Awake and alert, non focal  E ENT: no pallor, no icterus, oral mucosa moist Cardiovascular: No JVD. S1-S2 present, rhythmic, no gallops, rubs, or murmurs. No lower extremity edema. Pulmonary: positive breath sounds bilaterally, with no wheezing, rhonchi or rales. Gastrointestinal. Abdomen soft and non tender Skin. Bilateral upper extremity dressings in place, positive non pitting edema upper extremities.  Musculoskeletal: no joint deformities  Data Reviewed: I have personally reviewed following labs and imaging studies  CBC: Recent Labs  Lab 11/20/21 0502 11/21/21 0448 11/22/21 0352 11/23/21 0305 11/24/21 0318 11/25/21 0337  WBC 28.3* 25.8* 21.7* 19.3* 12.6*  --   HGB 8.2* 8.1* 7.5* 7.1* 6.5* 7.9*  HCT 25.7* 25.0* 23.6* 22.4* 20.5* 24.6*  MCV 88.9 89.3 89.7 90.7 89.9  --   PLT 426* 552* 664* 794* 888*  --    Basic Metabolic Panel: Recent Labs  Lab 11/20/21 0502 11/21/21 0448 11/22/21 0352 11/24/21 0318  NA 131* 129* 131* 131*  K 3.8 3.8 3.8 3.9  CL 105 102 101 100  CO2 21* 22 24 27   GLUCOSE 120* 110* 104* 113*  BUN 13 14 15 15   CREATININE 0.53* 0.48* 0.40* 0.42*  CALCIUM 7.2* 7.5* 7.6* 7.6*  MG  2.1  --   --   --    GFR: Estimated Creatinine Clearance: 109.4 mL/min (A) (by C-G formula based on SCr of 0.42 mg/dL (L)). Liver Function Tests: No results for input(s): AST, ALT, ALKPHOS, BILITOT, PROT, ALBUMIN in the last 168 hours. No results for input(s): LIPASE, AMYLASE in the last 168 hours. No results for input(s): AMMONIA in the last 168 hours. Coagulation Profile: No results for input(s): INR, PROTIME in the last 168 hours. Cardiac Enzymes: Recent Labs  Lab 11/25/21 0337  CKTOTAL 18*   BNP (last 3 results) No results for input(s): PROBNP in the last 8760 hours. HbA1C: No results for input(s): HGBA1C in the last 72 hours. CBG: No results for input(s): GLUCAP in the last 168 hours. Lipid Profile: No results for input(s): CHOL, HDL, LDLCALC, TRIG, CHOLHDL, LDLDIRECT in the last 72 hours. Thyroid Function Tests: No results for input(s): TSH, T4TOTAL, FREET4, T3FREE, THYROIDAB in the last 72 hours. Anemia Panel: Recent Labs    11/25/21 0337  FERRITIN 58  TIBC 268  IRON 13*      Radiology Studies: I have reviewed all of the imaging during this hospital visit personally     Scheduled Meds:  (feeding supplement) PROSource Plus  30 mL Oral TID BM   acetaminophen  650 mg Oral Q6H   vitamin C  500 mg Oral Daily   Chlorhexidine Gluconate Cloth  6 each Topical Daily   feeding supplement  237 mL Oral BID BM   ferrous sulfate  325 mg Oral Q breakfast   nicotine  14 mg Transdermal Daily   pantoprazole  40 mg Oral BID   QUEtiapine  100 mg Oral BID   Continuous Infusions:  DAPTOmycin (CUBICIN)  IV 500 mg (11/25/21 1946)     LOS: 12 days        Jocilyn Trego Gerome Apley, MD

## 2021-11-27 DIAGNOSIS — L03113 Cellulitis of right upper limb: Secondary | ICD-10-CM | POA: Diagnosis not present

## 2021-11-27 DIAGNOSIS — D62 Acute posthemorrhagic anemia: Secondary | ICD-10-CM | POA: Diagnosis not present

## 2021-11-27 DIAGNOSIS — R768 Other specified abnormal immunological findings in serum: Secondary | ICD-10-CM | POA: Diagnosis not present

## 2021-11-27 DIAGNOSIS — F15951 Other stimulant use, unspecified with stimulant-induced psychotic disorder with hallucinations: Secondary | ICD-10-CM | POA: Diagnosis not present

## 2021-11-27 LAB — BASIC METABOLIC PANEL
Anion gap: 5 (ref 5–15)
BUN: 25 mg/dL — ABNORMAL HIGH (ref 6–20)
CO2: 28 mmol/L (ref 22–32)
Calcium: 8.4 mg/dL — ABNORMAL LOW (ref 8.9–10.3)
Chloride: 102 mmol/L (ref 98–111)
Creatinine, Ser: 0.42 mg/dL — ABNORMAL LOW (ref 0.61–1.24)
GFR, Estimated: >60 mL/min (ref >60)
Glucose, Bld: 115 mg/dL — ABNORMAL HIGH (ref 70–99)
Potassium: 4.1 mmol/L (ref 3.5–5.1)
Sodium: 135 mmol/L (ref 135–145)

## 2021-11-27 LAB — CBC
HCT: 25.1 % — ABNORMAL LOW (ref 39.0–52.0)
Hemoglobin: 7.8 g/dL — ABNORMAL LOW (ref 13.0–17.0)
MCH: 28.4 pg (ref 26.0–34.0)
MCHC: 31.1 g/dL (ref 30.0–36.0)
MCV: 91.3 fL (ref 80.0–100.0)
Platelets: 885 10*3/uL — ABNORMAL HIGH (ref 150–400)
RBC: 2.75 MIL/uL — ABNORMAL LOW (ref 4.22–5.81)
RDW: 15.2 % (ref 11.5–15.5)
WBC: 10.1 10*3/uL (ref 4.0–10.5)
nRBC: 0 % (ref 0.0–0.2)

## 2021-11-27 NOTE — Progress Notes (Signed)
°   11/27/21 0512  Assess: MEWS Score  Temp 98 F (36.7 C)  BP (!) 142/69  Pulse Rate (!) 113  Resp 16  SpO2 99 %  O2 Device Room Air  Assess: MEWS Score  MEWS Temp 0  MEWS Systolic 0  MEWS Pulse 2  MEWS RR 0  MEWS LOC 0  MEWS Score 2  MEWS Score Color Yellow  Treat  Pain Scale 0-10  Pain Score 10  Assess: SIRS CRITERIA  SIRS Temperature  0  SIRS Pulse 1  SIRS Respirations  0  SIRS WBC 0  SIRS Score Sum  1   Pt in a lot pain give pain meds and re assess

## 2021-11-27 NOTE — Progress Notes (Signed)
PROGRESS NOTE    THESEUS MALBROUGH  [id number] DOB: 04/30/1986 DOA: 11/14/2021 PCP: [name], NP    Brief Narrative:  Mr. Shores was admitted to the hospital with the working diagnosis of severe sepsis due to right upper extremities cellulitis, complicated with streptococcus bacteremia.    35 yo male with the past medical history of polysubstance abuse who presented with bilateral arm pain. Patient injecting multiple drugs in both arms. On his initial physical examination his blood pressure was 107/88, HR 113, RR 17, oxygen saturation 100%, lungs clear to auscultation, heart S1 and S2 present and rhythmic, abdomen soft, right upper extremity with edema and erythema, bullous lesions. Left hand with erythema.    Sodium 130, potassium 3.0, chloride 91, bicarb 23, glucose 75, BUN 45, creatinine 1.19, lactic acid 3.5, white count 27.6, hemoglobin 13.9, hematocrit 41.9, platelets 321. SARS COVID-19 negative.   Urine analysis specific gravity 1.018. Toxicology screen positive for amphetamines.   Chest radiograph no infiltrates.   EKG 113 bpm, normal axis, normal intervals, sinus rhythm with poor R wave progression, positive LVH, no significant ST segment or T wave changes.   CT scan of upper extremities showed extensive cellulitis. He was placed on broad-spectrum antibiotic therapy.   12/13 I&D of bilateral upper extremities. Blood culture positive for Streptococcus group B.   12/15 patient developed melena with acute blood loss, hemoglobin down to 5.8.  Patient received 2 units packed red blood cell transfusion and was placed on intravenous pantoprazole. 12/15 upper endoscopy showing mild proximal mid esophagitis.  Nonbleeding gastric ulcers with no stigmata of bleeding.  Positive adherent clot.  Nonbleeding duodenal ulcers with adherent clot.  Ulcers were injected.   He had postprocedure respiratory failure that required invasive mechanical ventilation.   12/15, wound evaluation  of the left upper extremity with dressing change under sedation.  Incision and drainage of right upper extremity and loose closure of wound, right upper extremity.   Extubated late 12/15.   Tolerating well antibiotic therapy and dressing changes.  May requires further I&D depending on character of drainage and most likely require further skin debridement on the right.    12/20 Incision and drainage and debridement of right upper extremity, incision and drainage of left upper extremity.    Not able to place PICC line because bilateral upper extremity infections, high risk for line infection IV team not able to get a peripheral IV.    12/23 bilateral arms I&D, the OR under anesthesia.    Assessment & Plan:   Principal Problem:   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HCC) Active Problems:   Sepsis due to cellulitis (HCC)   Polysubstance abuse (HCC)   Hypokalemia   Streptococcal bacteremia   Right arm cellulitis   Staphylococcus aureus infection   Acute blood loss anemia   Upper GI bleed   Respiratory failure, post-operative (HCC)   Hepatitis C 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   Protein-calorie malnutrition, severe     Bilateral upper extremities cellulitis, in the setting of IV drug abuse. Streptococcus pyogenes bacteremia. Left hand wound culture positive for staph aureus.  Sp I&D bilateral upper extremities.  Follow up blood cultures with no growth on 12/14.    Patient had debridement, noted necrotic muscle with pus. Plan to continue with daptomycin IV during his hospitalization and change to oral linezold for 30 days more.   Continue antibiotic therapy with daptomycin IV while hospitalized and will change to oral linezolid for 30 more days at the time of  his discharge Duration of hospitalization will depend on surgical interventions needed.  Plan to to be transfer to SNF to continue care.    Improved pain control with increased dose of  IV  hydromorphone to 2 mg as needed, continue with oral oxycodone for moderate pain Continue with scheduled acetaminophen and as needed cyclobenzaprine    2. Upper GI bleed with acute blood loss anemia, gastric and duodenal ulcers.  Continue with pantoprazole.  Avoid non steroidal anti-inflammatory agents.   Patient is tolerating well po diet with increased appetite.    3. Hyponatremia, hypokalemia and hypomagnesemia. Severe protein calorie malnutrition.  Renal function continue to be stable, electrolytes are within normal limits. Check renal panel q 48 to q 72 hrs as needed.    4. Polysubstance abuse and depression/ tobacco abuse  Continue with 100 mg bid of seroquel and trazodone with good toleration.  Follow up with psychiatry as outpatient    Continue with Nicotine patch.  No clinical signs of withdrawal.    5. Post operative respiratory failure. Clinically resolved, patient with less than 24 hrs on mechanical ventilation    6. Anemia, likely blood loss from extensive upper extremities wounds. Thrombocytosis Iron deficiency anemia.  Sp 1 unit PRBC transfusion.  Serum iron is 13, TIBC 268, transferrin saturation 5 and ferritin of 58.  Holding on IV iron for now due to current systemic infection.   Follow up Hgb is 7,8 with hct 25,1 Continue with oral iron supplementation, check cell count q48 to 72 hs as needed.   Status is: Inpatient  Remains inpatient appropriate because: IV antibiotic therapy and surgical interventions   DVT prophylaxis:  Enoxaparin   Code Status:    full  Family Communication:  No family at the bedside      Nutrition Status: Nutrition Problem: Severe Malnutrition Etiology: acute illness, wound healing Signs/Symptoms: severe fat depletion, severe muscle depletion Interventions: Prostat, MVI, Ensure Enlive (each supplement provides 350kcal and 20 grams of protein)    Consultants:  Surgery  ID   Procedures:   As above   Antimicrobials:   Daptomycin     Subjective: Patient reports improvement in bilateral upper extremity pain, no nausea or vomiting, no dyspnea or chest pain.  Improved mobility of left hand   Objective: Vitals:   11/25/21 1957 11/26/21 1315 11/26/21 2300 11/27/21 0512  BP:  (!) 196/125 (!) 174/84 (!) 142/69  Pulse:  (!) 106 (!) 101 (!) 113  Resp:  20 20 16   Temp:  98.6 F (37 C) 98.6 F (37 C) 98 F (36.7 C)  TempSrc: Oral Oral  Oral  SpO2:  100% 98% 99%  Weight:      Height:        Intake/Output Summary (Last 24 hours) at 11/27/2021 1311 Last data filed at 11/27/2021 1029 Gross per 24 hour  Intake 2970 ml  Output 2125 ml  Net 845 ml   Filed Weights   11/14/21 0835 11/15/21 1608 11/25/21 1342  Weight: 60 kg 60 kg 60 kg    Examination:   General: Not in pain or dyspnea, deconditioned  Neurology: Awake and alert, non focal  E ENT: no pallor, no icterus, oral mucosa moist Cardiovascular: No JVD. S1-S2 present, rhythmic,  No lower extremity edema. Pulmonary:  positive breath sounds bilaterally, with  wheezing, rhonchi or rales. Gastrointestinal. Abdomen soft and non tender Skin. Bilateral upper extremity wounds with dressing in place.  Musculoskeletal: no joint deformities     Data Reviewed: I have personally  reviewed following labs and imaging studies  CBC: Recent Labs  Lab 11/21/21 0448 11/22/21 0352 11/23/21 0305 11/24/21 0318 11/25/21 0337 11/27/21 0316  WBC 25.8* 21.7* 19.3* 12.6*  --  10.1  HGB 8.1* 7.5* 7.1* 6.5* 7.9* 7.8*  HCT 25.0* 23.6* 22.4* 20.5* 24.6* 25.1*  MCV 89.3 89.7 90.7 89.9  --  91.3  PLT 552* 664* 794* 888*  --  [id number]   Basic Metabolic Panel: Recent Labs  Lab 11/21/21 0448 11/22/21 0352 11/24/21 0318 11/27/21 0316  NA 129* 131* 131* 135  K 3.8 3.8 3.9 4.1  CL 102 101 100 102  CO2 22 24 27 28   GLUCOSE 110* 104* 113* 115*  BUN 14 15 15  25*  CREATININE 0.48* 0.40* 0.42* 0.42*  CALCIUM 7.5* 7.6* 7.6* 8.4*   GFR: Estimated Creatinine  Clearance: 109.4 mL/min (A) (by C-G formula based on SCr of 0.42 mg/dL (L)). Liver Function Tests: No results for input(s): AST, ALT, ALKPHOS, BILITOT, PROT, ALBUMIN in the last 168 hours. No results for input(s): LIPASE, AMYLASE in the last 168 hours. No results for input(s): AMMONIA in the last 168 hours. Coagulation Profile: No results for input(s): INR, PROTIME in the last 168 hours. Cardiac Enzymes: Recent Labs  Lab 11/25/21 0337  CKTOTAL 18*   BNP (last 3 results) No results for input(s): PROBNP in the last 8760 hours. HbA1C: No results for input(s): HGBA1C in the last 72 hours. CBG: No results for input(s): GLUCAP in the last 168 hours. Lipid Profile: No results for input(s): CHOL, HDL, LDLCALC, TRIG, CHOLHDL, LDLDIRECT in the last 72 hours. Thyroid Function Tests: No results for input(s): TSH, T4TOTAL, FREET4, T3FREE, THYROIDAB in the last 72 hours. Anemia Panel: Recent Labs    11/25/21 0337  FERRITIN 58  TIBC 268  IRON 13*      Radiology Studies: I have reviewed all of the imaging during this hospital visit personally     Scheduled Meds:  (feeding supplement) PROSource Plus  30 mL Oral TID BM   acetaminophen  650 mg Oral Q6H   vitamin C  500 mg Oral Daily   Chlorhexidine Gluconate Cloth  6 each Topical Daily   feeding supplement  237 mL Oral BID BM   ferrous sulfate  325 mg Oral Q breakfast   nicotine  14 mg Transdermal Daily   pantoprazole  40 mg Oral BID   QUEtiapine  100 mg Oral BID   Continuous Infusions:  DAPTOmycin (CUBICIN)  IV 500 mg (11/26/21 2001)     LOS: 13 days        Mabel Roll Gerome Apley, MD

## 2021-11-27 NOTE — Progress Notes (Signed)
°   11/27/21 1446  Assess: MEWS Score  Temp 98.3 F (36.8 C)  BP 121/60  Pulse Rate (!) 115  Resp 18  SpO2 100 %  O2 Device Room Air  Assess: MEWS Score  MEWS Temp 0  MEWS Systolic 0  MEWS Pulse 2  MEWS RR 0  MEWS LOC 0  MEWS Score 2  MEWS Score Color Yellow  2nd Pain Site  Pain Score Asleep  Take Vital Signs  Increase Vital Sign Frequency  Yellow: Q 2hr X 2 then Q 4hr X 2, if remains yellow, continue Q 4hrs  Escalate  MEWS: Escalate Yellow: discuss with charge nurse/RN and consider discussing with provider and RRT  Notify: Charge Nurse/RN  Name of Charge Nurse/RN Notified Toniann Fail RN  Date Charge Nurse/RN Notified 11/27/21  Time Charge Nurse/RN Notified 1500  Document  Patient Outcome Other (Comment) (Asymptomatic)  Progress note created (see row info) Yes  Assess: SIRS CRITERIA  SIRS Temperature  0  SIRS Pulse 1  SIRS Respirations  0  SIRS WBC 0  SIRS Score Sum  1

## 2021-11-28 DIAGNOSIS — F1910 Other psychoactive substance abuse, uncomplicated: Secondary | ICD-10-CM | POA: Diagnosis not present

## 2021-11-28 DIAGNOSIS — K922 Gastrointestinal hemorrhage, unspecified: Secondary | ICD-10-CM

## 2021-11-28 DIAGNOSIS — L03113 Cellulitis of right upper limb: Secondary | ICD-10-CM | POA: Diagnosis not present

## 2021-11-28 DIAGNOSIS — E876 Hypokalemia: Secondary | ICD-10-CM | POA: Diagnosis not present

## 2021-11-28 DIAGNOSIS — D62 Acute posthemorrhagic anemia: Secondary | ICD-10-CM | POA: Diagnosis not present

## 2021-11-28 LAB — HEPATITIS C GENOTYPE: HCV Genotype: 3

## 2021-11-28 NOTE — Progress Notes (Signed)
PROGRESS NOTE    Jason Buchanan  [id number] DOB: Apr 04, 1986 DOA: 11/14/2021 PCP: [name], NP    No chief complaint on file.   Brief Narrative:  Mr. Jason Buchanan was admitted to the hospital with the working diagnosis of severe sepsis due to right upper extremities cellulitis, complicated with streptococcus bacteremia.    35 yo male with the past medical history of polysubstance abuse who presented with bilateral arm pain. Patient injecting multiple drugs in both arms. On his initial physical examination his blood pressure was 107/88, HR 113, RR 17, oxygen saturation 100%, lungs clear to auscultation, heart S1 and S2 present and rhythmic, abdomen soft, right upper extremity with edema and erythema, bullous lesions. Left hand with erythema.    Sodium 130, potassium 3.0, chloride 91, bicarb 23, glucose 75, BUN 45, creatinine 1.19, lactic acid 3.5, white count 27.6, hemoglobin 13.9, hematocrit 41.9, platelets 321. SARS COVID-19 negative.   Urine analysis specific gravity 1.018. Toxicology screen positive for amphetamines.   Chest radiograph no infiltrates.   EKG 113 bpm, normal axis, normal intervals, sinus rhythm with poor R wave progression, positive LVH, no significant ST segment or T wave changes.   CT scan of upper extremities showed extensive cellulitis. He was placed on broad-spectrum antibiotic therapy.   12/13 I&D of bilateral upper extremities. Blood culture positive for Streptococcus group B.   12/15 patient developed melena with acute blood loss, hemoglobin down to 5.8.  Patient received 2 units packed red blood cell transfusion and was placed on intravenous pantoprazole. 12/15 upper endoscopy showing mild proximal mid esophagitis.  Nonbleeding gastric ulcers with no stigmata of bleeding.  Positive adherent clot.  Nonbleeding duodenal ulcers with adherent clot.  Ulcers were injected.   He had postprocedure respiratory failure that required invasive mechanical  ventilation.   12/15, wound evaluation of the left upper extremity with dressing change under sedation.  Incision and drainage of right upper extremity and loose closure of wound, right upper extremity.   Extubated late 12/15.   Tolerating well antibiotic therapy and dressing changes.  May requires further I&D depending on character of drainage and most likely require further skin debridement on the right.    12/20 Incision and drainage and debridement of right upper extremity, incision and drainage of left upper extremity.    Not able to place PICC line because bilateral upper extremity infections, high risk for line infection IV team not able to get a peripheral IV.    12/23 bilateral arms I&D, the OR under anesthesia.      Assessment & Plan:   Principal Problem:   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[place]) Active Problems:   Sepsis due to cellulitis (HCC)   Polysubstance abuse (HCC)   Hypokalemia   Streptococcal bacteremia   Right arm cellulitis   Staphylococcus aureus infection   Acute blood loss anemia   Upper GI bleed   Respiratory failure, post-operative (HCC)   Hepatitis C 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   Protein-calorie malnutrition, severe  #1 bilateral upper extremity cellulitis in the setting of IV drug abuse/Streptococcus pyogenes bacteremia/left hand wound culture positive staph aureus -Status post multiple I&D bilateral upper extremities by hand surgeon. -Follow-up cultures with no growth 12/14. -Patient noted to have debridement, noted necrotic muscle with pus. -Continue IV daptomycin during this hospitalization transition to oral Zyvox for 30 more days on discharge per ID recommendations. -Duration of hospitalization will depend on further surgical interventions needed. -Once no further surgical interventions is needed will be transferred  to SNF for continued treatment. -Continue current pain regimen of IV Dilaudid to 2 mg as needed,  oral oxycodone for moderate pain, scheduled Tylenol, Robaxin.  2.  Upper GI bleed with acute blood loss anemia/gastric and duodenal ulcers -Status post upper endoscopy. -Continue PPI. -Avoid NSAIDs.  3.  Hypokalemia/hypomagnesemia/hyponatremia/severe protein calorie malnutrition -Repleted. -Repeat labs in AM.  4.  Polysubstance abuse and depression/tobacco abuse -Seen by psychiatry who recommended 100 mg of Seroquel twice daily and trazodone which patient is tolerating. -Outpatient follow-up with psychiatry.  5.  Postop respiratory failure -Resolved. -Patient noted to have been on mechanical ventilation for less than 24 hours.  6.  Anemia/likely blood loss from extensive upper extremity wounds/thrombocytosis/iron deficiency anemia -Status post transfusion 1 unit packed red blood cells. -Anemia panel with iron level of 13, TIBC of 268, transferrin 1 5, ferritin of 58. -Due to current systemic infection we will hold off on IV iron supplementation. -Follow H&H. -Transfusion threshold hemoglobin < 7.   DVT prophylaxis: SCDs Code Status: Full Family Communication: Updated patient.  No family at bedside. Disposition:   Status is: Inpatient  Remains inpatient appropriate because: Severity of illness     Consultants:  Hand surgeon: Dr. Izora Ribas 11/14/2021 Orthopedics: Dr. Kristine Linea 11/14/2021 ID: Dr.Comer, 11/15/2021 Gastroenterology: Dr. Ewing Schlein 11/16/2021 PCCM: Dr. Katrinka Blazing 11/17/2021 Psychiatry: Dr. Jannifer Franklin 11/19/2021 Wound care RN   Procedures:  CT right hand 11/14/2021 CT right humerus 11/14/2021 CT right forearm 11/14/2021 Plain films of the left hand 11/14/2021 Chest x-ray 11/14/2021, 11/17/2021 Abdominal films 11/17/2021 2D echo 11/16/2021 Incision and drainage and debridement right upper extremity/incision and drainage of left upper extremity by Dr. Izora Ribas hand surgeon 11/23/2021. Wound evaluation left upper extremity with dressing change under sedation/incision and  drainage right upper extremity and loose closure of wound, right upper extremity per Dr. Izora Ribas, hand surgeon 11/18/2021 Right hand fasciotomy/right hand carpal tunnel release/right hand renal scan no release/right forearm 3 compartment fasciotomy/thorough irrigation of hand and forearm/dorsal hand incision and drainage including extensor tendon sheath per Dr. Izora Ribas, hand surgeon 11/17/2021 Central line insertion per PCCM, Dr. Katrinka Blazing 11/17/2021 Upper endoscopy per GI, Dr. Ewing Schlein 11/17/2021 Left wrist deep abscess incision and drainage/left volar forearm fasciotomy/left wrist carpal tunnel release extended with Guyon's canal release--per Dr. Yehuda Budd orthopedics 11/15/2021   Antimicrobials:  Anti-infectives (From admission, onward)    Start     Dose/Rate Route Frequency Ordered Stop   11/17/21 1700  cefTRIAXone (ROCEPHIN) 2 g in sodium chloride 0.9 % 100 mL IVPB  Status:  Discontinued        2 g 200 mL/hr over 30 Minutes Intravenous Every 24 hours 11/17/21 1019 11/21/21 1047   11/17/21 1700  DAPTOmycin (CUBICIN) 500 mg in sodium chloride 0.9 % IVPB        8 mg/kg  60 kg 120 mL/hr over 30 Minutes Intravenous Daily 11/17/21 1555     11/17/21 1200  metroNIDAZOLE (FLAGYL) IVPB 500 mg  Status:  Discontinued        500 mg 100 mL/hr over 60 Minutes Intravenous Every 12 hours 11/17/21 1019 11/18/21 0924   11/15/21 1000  cefTRIAXone (ROCEPHIN) 2 g in sodium chloride 0.9 % 100 mL IVPB  Status:  Discontinued        2 g 200 mL/hr over 30 Minutes Intravenous Every 24 hours 11/14/21 1834 11/15/21 0405   11/15/21 1000  clindamycin (CLEOCIN) IVPB 600 mg  Status:  Discontinued        600 mg 100 mL/hr over 30 Minutes Intravenous Every 8 hours  11/15/21 0405 11/15/21 1523   11/15/21 0800  penicillin G potassium 12 Million Units in dextrose 5 % 500 mL continuous infusion  Status:  Discontinued        12 Million Units 41.7 mL/hr over 12 Hours Intravenous Every 12 hours 11/15/21 0405 11/17/21 1956   11/14/21 2200   vancomycin (VANCOREADY) IVPB 750 mg/150 mL  Status:  Discontinued        750 mg 150 mL/hr over 60 Minutes Intravenous Every 12 hours 11/14/21 1524 11/15/21 0405   11/14/21 0900  vancomycin (VANCOCIN) IVPB 1000 mg/200 mL premix        1,000 mg 200 mL/hr over 60 Minutes Intravenous  Once 11/14/21 0849 11/14/21 1043   11/14/21 0900  cefTRIAXone (ROCEPHIN) 2 g in sodium chloride 0.9 % 100 mL IVPB        2 g 200 mL/hr over 30 Minutes Intravenous  Once 11/14/21 0849 11/14/21 0935         Subjective: Laying in bed.  States pain is better controlled with medication changes per Dr. Cathlean Sauer.  No chest pain.  No shortness of breath.  States Copy states may need 2 or 3 more surgeries.  Objective: Vitals:   11/27/21 1850 11/27/21 2113 11/28/21 0552 11/28/21 1238  BP: (!) 152/71 (!) 161/71 (!) 142/65 (!) 172/91  Pulse: (!) 111 (!) 118 (!) 111 (!) 106  Resp: 18 16 16 18   Temp: 97.7 F (36.5 C) 98.5 F (36.9 C) 98.1 F (36.7 C) 98 F (36.7 C)  TempSrc: Oral Oral Oral Oral  SpO2: 100% 100% 100%   Weight:      Height:        Intake/Output Summary (Last 24 hours) at 11/28/2021 1918 Last data filed at 11/28/2021 1601 Gross per 24 hour  Intake 2180 ml  Output 2650 ml  Net -470 ml   Filed Weights   11/14/21 0835 11/15/21 1608 11/25/21 1342  Weight: 60 kg 60 kg 60 kg    Examination:  General exam: Appears calm and comfortable  Respiratory system: Clear to auscultation anterior lung fields with. Respiratory effort normal. Cardiovascular system: S1 & S2 heard, RRR. No JVD, murmurs, rubs, gallops or clicks. No pedal edema. Gastrointestinal system: Abdomen is nondistended, soft and nontender. No organomegaly or masses felt. Normal bowel sounds heard. Central nervous system: Alert and oriented. No focal neurological deficits. Extremities: Bilateral extremities in bandages. Skin: No rashes, lesions or ulcers Psychiatry: Judgement and insight appear normal. Mood & affect appropriate.      Data Reviewed: I have personally reviewed following labs and imaging studies  CBC: Recent Labs  Lab 11/22/21 0352 11/23/21 0305 11/24/21 0318 11/25/21 0337 11/27/21 0316  WBC 21.7* 19.3* 12.6*  --  10.1  HGB 7.5* 7.1* 6.5* 7.9* 7.8*  HCT 23.6* 22.4* 20.5* 24.6* 25.1*  MCV 89.7 90.7 89.9  --  91.3  PLT 664* 794* 888*  --  885*    Basic Metabolic Panel: Recent Labs  Lab 11/22/21 0352 11/24/21 0318 11/27/21 0316  NA 131* 131* 135  K 3.8 3.9 4.1  CL 101 100 102  CO2 24 27 28   GLUCOSE 104* 113* 115*  BUN 15 15 25*  CREATININE 0.40* 0.42* 0.42*  CALCIUM 7.6* 7.6* 8.4*    GFR: Estimated Creatinine Clearance: 109.4 mL/min (A) (by C-G formula based on SCr of 0.42 mg/dL (L)).  Liver Function Tests: No results for input(s): AST, ALT, ALKPHOS, BILITOT, PROT, ALBUMIN in the last 168 hours.  CBG: No results for  input(s): GLUCAP in the last 168 hours.   No results found for this or any previous visit (from the past 240 hour(s)).       Radiology Studies: No results found.      Scheduled Meds:  (feeding supplement) PROSource Plus  30 mL Oral TID BM   acetaminophen  650 mg Oral Q6H   vitamin C  500 mg Oral Daily   Chlorhexidine Gluconate Cloth  6 each Topical Daily   feeding supplement  237 mL Oral BID BM   ferrous sulfate  325 mg Oral Q breakfast   nicotine  14 mg Transdermal Daily   pantoprazole  40 mg Oral BID   QUEtiapine  100 mg Oral BID   Continuous Infusions:  DAPTOmycin (CUBICIN)  IV 500 mg (11/27/21 2117)     LOS: 14 days    Time spent: 35 minutes    Ramiro Harvest, MD [facility]   To contact the attending provider between 7A-7P or the covering provider during after hours 7P-7A, please log into the web site [email or web address] and access using universal [place] password for that web site. If you do not have the password, please call the hospital operator.  11/28/2021, 7:18 PM

## 2021-11-28 NOTE — Progress Notes (Signed)
Mobility Specialist - Progress Note    11/28/21 1452  Oxygen Therapy  O2 Device Room Air  Mobility  Activity Ambulated in hall;Ambulated to bathroom  Level of Assistance Modified independent, requires aide device or extra time  Assistive Device None  Distance Ambulated (ft) 300 ft  Mobility Ambulated with assistance in hallway  Mobility Response Tolerated well  Mobility performed by Mobility specialist  $Mobility charge 1 Mobility   Upon entry pt was agreeable to mobilize and required no assistance to sit or stand at EOB. Pt requested to use bathroom prior to ambulating in hallway. Once done, pt ambulated 300 ft in hall, took 1 standing rest break, and experienced 1 LOB episode, but regained stability shortly after. At EOS, pt returned to room and was left sitting up in recliner with call bell at side. RN informed of session and pain medication request from pt.   Arliss Journey Mobility Specialist Acute Rehabilitation Services Phone: [phone] 11/28/21, 2:55 PM

## 2021-11-29 DIAGNOSIS — D62 Acute posthemorrhagic anemia: Secondary | ICD-10-CM | POA: Diagnosis not present

## 2021-11-29 DIAGNOSIS — F1910 Other psychoactive substance abuse, uncomplicated: Secondary | ICD-10-CM | POA: Diagnosis not present

## 2021-11-29 DIAGNOSIS — E876 Hypokalemia: Secondary | ICD-10-CM | POA: Diagnosis not present

## 2021-11-29 DIAGNOSIS — L03113 Cellulitis of right upper limb: Secondary | ICD-10-CM | POA: Diagnosis not present

## 2021-11-29 LAB — CBC WITH DIFFERENTIAL/PLATELET
Abs Immature Granulocytes: 0.03 10*3/uL (ref 0.00–0.07)
Basophils Absolute: 0.1 10*3/uL (ref 0.0–0.1)
Basophils Relative: 1 %
Eosinophils Absolute: 0.3 10*3/uL (ref 0.0–0.5)
Eosinophils Relative: 4 %
HCT: 25.1 % — ABNORMAL LOW (ref 39.0–52.0)
Hemoglobin: 7.7 g/dL — ABNORMAL LOW (ref 13.0–17.0)
Immature Granulocytes: 0 %
Lymphocytes Relative: 27 %
Lymphs Abs: 2.2 10*3/uL (ref 0.7–4.0)
MCH: 27.8 pg (ref 26.0–34.0)
MCHC: 30.7 g/dL (ref 30.0–36.0)
MCV: 90.6 fL (ref 80.0–100.0)
Monocytes Absolute: 0.8 10*3/uL (ref 0.1–1.0)
Monocytes Relative: 9 %
Neutro Abs: 4.9 10*3/uL (ref 1.7–7.7)
Neutrophils Relative %: 59 %
Platelets: 686 10*3/uL — ABNORMAL HIGH (ref 150–400)
RBC: 2.77 MIL/uL — ABNORMAL LOW (ref 4.22–5.81)
RDW: 15 % (ref 11.5–15.5)
WBC: 8.3 10*3/uL (ref 4.0–10.5)
nRBC: 0 % (ref 0.0–0.2)

## 2021-11-29 LAB — BASIC METABOLIC PANEL
Anion gap: 6 (ref 5–15)
BUN: 23 mg/dL — ABNORMAL HIGH (ref 6–20)
CO2: 27 mmol/L (ref 22–32)
Calcium: 8.1 mg/dL — ABNORMAL LOW (ref 8.9–10.3)
Chloride: 101 mmol/L (ref 98–111)
Creatinine, Ser: 0.42 mg/dL — ABNORMAL LOW (ref 0.61–1.24)
GFR, Estimated: >60 mL/min (ref >60)
Glucose, Bld: 123 mg/dL — ABNORMAL HIGH (ref 70–99)
Potassium: 3.4 mmol/L — ABNORMAL LOW (ref 3.5–5.1)
Sodium: 134 mmol/L — ABNORMAL LOW (ref 135–145)

## 2021-11-29 LAB — MAGNESIUM: Magnesium: 1.7 mg/dL (ref 1.7–2.4)

## 2021-11-29 MED ORDER — MAGNESIUM SULFATE 4 GM/100ML IV SOLN
4.0000 g | Freq: Once | INTRAVENOUS | Status: AC
  Administered 2021-11-29: 14:00:00 4 g via INTRAVENOUS
  Filled 2021-11-29: qty 100

## 2021-11-29 MED ORDER — POTASSIUM CHLORIDE CRYS ER 10 MEQ PO TBCR
40.0000 meq | EXTENDED_RELEASE_TABLET | Freq: Once | ORAL | Status: AC
  Administered 2021-11-29: 11:00:00 40 meq via ORAL
  Filled 2021-11-29: qty 4

## 2021-11-29 NOTE — Progress Notes (Signed)
Patient is 2-week status post initial I&D of bilateral upper extremities and now is status post most recent VAC change and I&D on 11/25/2021.  The patient has much improved range of motion of the digits in the hand.  The left side is much better than the right in terms of motion and swelling.  His digits remain warm and well-perfused however with significant stiffness.  His right upper extremity VAC still has sanguinous output.  The patient is ambulating and is motivated to go through rehabilitation program for drug use which I encouraged him to follow through with.  Plan will be for a repeat bilateral upper extremity I&D possible VAC change versus partial closure versus grafting.  I will discuss with Dr. Izora Ribas and determine the date which will likely be later this week.  Continue with occupational therapy for range of motion.  Mathis Dad, MD Orthopaedic Hand Surgeon

## 2021-11-29 NOTE — Progress Notes (Signed)
Pharmacy Antibiotic Note  Jason Buchanan is a 35 y.o. male admitted on 11/14/2021 with bilateral upper extremity wounds now s/p fasciotomy and I&D.  Pharmacy has been consulted for daptomycin dosing.  Today, 11/29/21  - Day #13 Dapto - SCr stable at 0.42 - Ck 65 (12/16), 18 (12/23)  Plan: Continue Daptomycin 8 mg/kg daily as inpatient ID plans for antibiotics upon discharge rec 30 days Zyvox 600mg  bid Will f/u renal function weekly CK (next 12/30)  Height: 5\' 7"  (170.2 cm) Weight: 60 kg (132 lb 4.4 oz) IBW/kg (Calculated) : 66.1  Temp (24hrs), Avg:97.9 F (36.6 C), Min:97.8 F (36.6 C), Max:98 F (36.7 C)  Recent Labs  Lab 11/23/21 0305 11/24/21 0318 11/27/21 0316 11/29/21 0445  WBC 19.3* 12.6* 10.1 8.3  CREATININE  --  0.42* 0.42* 0.42*     Estimated Creatinine Clearance: 109.4 mL/min (A) (by C-G formula based on SCr of 0.42 mg/dL (L)).    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 Had prolonged block (>30 min) after succinylcholine for GI procedure.    Antimicrobials this admission:  12/12 rocephin/vanc >> 12/12 12/13 clinda >> 12/13 12/13 PCN G >> 12/15 12/15 CTX>12/19 12/15 Flagyl>>12/16 12/15 dapto >>  Dose adjustments this admission: none  Microbiology results:  12/12 BCx: 1/4 strep pyogenes; R EES, sens to others  12/13 left hand dorsal: abund streptococcus pyogenes, rare MRSA  12/13 R wrist: mod strep pyoges, rare MRSA 12/14 repeat BCx: NGF 12/15 MRSA PCR: detected   1/15, PharmD, BCPS Pharmacy: [phone] 11/29/2021, 10:15 AM

## 2021-11-29 NOTE — Progress Notes (Signed)
Physical Therapy Discharge Patient Details Name: Jason Buchanan MRN: [id number] DOB: Jun 15, 1986 Today's Date: 11/29/2021 Time:  -     Patient discharged from PT services secondary to goals met and no further PT needs identified.Patient uop ad lib in room, ambulated x 300'  with mobility tech 12/26.  Please see latest therapy progress note for current level of functioning and progress toward goals.    Progress and discharge plan discussed with patient and/or caregiver: RN aware.   GP     Claretha Cooper 11/29/2021, 12:27 PM  [place] Pager [phone] Office [phone]

## 2021-11-29 NOTE — Progress Notes (Signed)
PROGRESS NOTE    Jason Buchanan  [id number] DOB: Apr 04, 1986 DOA: 11/14/2021 PCP: [name], NP    No chief complaint on file.   Brief Narrative:  Jason Buchanan was admitted to the hospital with the working diagnosis of severe sepsis due to right upper extremities cellulitis, complicated with streptococcus bacteremia.    35 yo male with the past medical history of polysubstance abuse who presented with bilateral arm pain. Patient injecting multiple drugs in both arms. On his initial physical examination his blood pressure was 107/88, HR 113, RR 17, oxygen saturation 100%, lungs clear to auscultation, heart S1 and S2 present and rhythmic, abdomen soft, right upper extremity with edema and erythema, bullous lesions. Left hand with erythema.    Sodium 130, potassium 3.0, chloride 91, bicarb 23, glucose 75, BUN 45, creatinine 1.19, lactic acid 3.5, white count 27.6, hemoglobin 13.9, hematocrit 41.9, platelets 321. SARS COVID-19 negative.   Urine analysis specific gravity 1.018. Toxicology screen positive for amphetamines.   Chest radiograph no infiltrates.   EKG 113 bpm, normal axis, normal intervals, sinus rhythm with poor R wave progression, positive LVH, no significant ST segment or T wave changes.   CT scan of upper extremities showed extensive cellulitis. He was placed on broad-spectrum antibiotic therapy.   12/13 I&D of bilateral upper extremities. Blood culture positive for Streptococcus group B.   12/15 patient developed melena with acute blood loss, hemoglobin down to 5.8.  Patient received 2 units packed red blood cell transfusion and was placed on intravenous pantoprazole. 12/15 upper endoscopy showing mild proximal mid esophagitis.  Nonbleeding gastric ulcers with no stigmata of bleeding.  Positive adherent clot.  Nonbleeding duodenal ulcers with adherent clot.  Ulcers were injected.   He had postprocedure respiratory failure that required invasive mechanical  ventilation.   12/15, wound evaluation of the left upper extremity with dressing change under sedation.  Incision and drainage of right upper extremity and loose closure of wound, right upper extremity.   Extubated late 12/15.   Tolerating well antibiotic therapy and dressing changes.  May requires further I&D depending on character of drainage and most likely require further skin debridement on the right.    12/20 Incision and drainage and debridement of right upper extremity, incision and drainage of left upper extremity.    Not able to place PICC line because bilateral upper extremity infections, high risk for line infection IV team not able to get a peripheral IV.    12/23 bilateral arms I&D, the OR under anesthesia.      Assessment & Plan:   Principal Problem:   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[place]) Active Problems:   Sepsis due to cellulitis (HCC)   Polysubstance abuse (HCC)   Hypokalemia   Streptococcal bacteremia   Right arm cellulitis   Staphylococcus aureus infection   Acute blood loss anemia   Upper GI bleed   Respiratory failure, post-operative (HCC)   Hepatitis C 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   Protein-calorie malnutrition, severe  #1 bilateral upper extremity cellulitis in the setting of IV drug abuse/Streptococcus pyogenes bacteremia/left hand wound culture positive staph aureus -Status post multiple I&D bilateral upper extremities by hand surgeon. -Follow-up cultures with no growth 12/14. -Patient noted to have debridement, noted necrotic muscle with pus. -Continue IV daptomycin during this hospitalization transition to oral Zyvox for 30 more days on discharge per ID recommendations. -Duration of hospitalization will depend on further surgical interventions needed. -Once no further surgical interventions is needed will be transferred  to SNF for continued treatment. -Continue current pain regimen of IV Dilaudid to 2 mg as needed,  oral oxycodone for moderate pain, scheduled Tylenol, Robaxin.  2.  Upper GI bleed with acute blood loss anemia/gastric and duodenal ulcers -Status post upper endoscopy. -Avoid NSAIDs.   -PPI.   3.  Hypokalemia/hypomagnesemia/hyponatremia/severe protein calorie malnutrition -Potassium at 3.4 this morning, magnesium at 1.7. -K. Dur 40 mEq p.o. x1. -Magnesium sulfate 4 g IV x1. -Repeat labs in AM.  4.  Polysubstance abuse and depression/tobacco abuse -Seen by psychiatry who recommended 100 mg of Seroquel twice daily and trazodone which patient is tolerating. -Outpatient follow-up with psychiatry.  5.  Postop respiratory failure -Resolved. -Patient noted to have been on mechanical ventilation for less than 24 hours.  6.  Anemia/likely blood loss from extensive upper extremity wounds/thrombocytosis/iron deficiency anemia -Status post transfusion 1 unit packed red blood cells. -Anemia panel with iron level of 13, TIBC of 268, transferrin 1 5, ferritin of 58. -Due to current systemic infection we will hold off on IV iron supplementation. -Follow H&H. -Transfusion threshold hemoglobin < 7.   DVT prophylaxis: SCDs Code Status: Full Family Communication: Updated patient.  No family at bedside. Disposition:   Status is: Inpatient  Remains inpatient appropriate because: Severity of illness     Consultants:  Hand surgeon: Dr. Izora Ribas 11/14/2021 Orthopedics: Dr. Kristine Linea 11/14/2021 ID: Dr.Comer, 11/15/2021 Gastroenterology: Dr. Ewing Schlein 11/16/2021 PCCM: Dr. Katrinka Blazing 11/17/2021 Psychiatry: Dr. Jannifer Franklin 11/19/2021 Wound care RN   Procedures:  CT right hand 11/14/2021 CT right humerus 11/14/2021 CT right forearm 11/14/2021 Plain films of the left hand 11/14/2021 Chest x-ray 11/14/2021, 11/17/2021 Abdominal films 11/17/2021 2D echo 11/16/2021 Incision and drainage and debridement right upper extremity/incision and drainage of left upper extremity by Dr. Izora Ribas hand surgeon  11/23/2021. Wound evaluation left upper extremity with dressing change under sedation/incision and drainage right upper extremity and loose closure of wound, right upper extremity per Dr. Izora Ribas, hand surgeon 11/18/2021 Right hand fasciotomy/right hand carpal tunnel release/right hand renal scan no release/right forearm 3 compartment fasciotomy/thorough irrigation of hand and forearm/dorsal hand incision and drainage including extensor tendon sheath per Dr. Izora Ribas, hand surgeon 11/17/2021 Central line insertion per PCCM, Dr. Katrinka Blazing 11/17/2021 Upper endoscopy per GI, Dr. Ewing Schlein 11/17/2021 Left wrist deep abscess incision and drainage/left volar forearm fasciotomy/left wrist carpal tunnel release extended with Guyon's canal release--per Dr. Yehuda Budd orthopedics 11/15/2021   Antimicrobials:  Anti-infectives (From admission, onward)    Start     Dose/Rate Route Frequency Ordered Stop   11/17/21 1700  cefTRIAXone (ROCEPHIN) 2 g in sodium chloride 0.9 % 100 mL IVPB  Status:  Discontinued        2 g 200 mL/hr over 30 Minutes Intravenous Every 24 hours 11/17/21 1019 11/21/21 1047   11/17/21 1700  DAPTOmycin (CUBICIN) 500 mg in sodium chloride 0.9 % IVPB        8 mg/kg  60 kg 120 mL/hr over 30 Minutes Intravenous Daily 11/17/21 1555     11/17/21 1200  metroNIDAZOLE (FLAGYL) IVPB 500 mg  Status:  Discontinued        500 mg 100 mL/hr over 60 Minutes Intravenous Every 12 hours 11/17/21 1019 11/18/21 0924   11/15/21 1000  cefTRIAXone (ROCEPHIN) 2 g in sodium chloride 0.9 % 100 mL IVPB  Status:  Discontinued        2 g 200 mL/hr over 30 Minutes Intravenous Every 24 hours 11/14/21 1834 11/15/21 0405   11/15/21 1000  clindamycin (CLEOCIN) IVPB 600 mg  Status:  Discontinued        600 mg 100 mL/hr over 30 Minutes Intravenous Every 8 hours 11/15/21 0405 11/15/21 1523   11/15/21 0800  penicillin G potassium 12 Million Units in dextrose 5 % 500 mL continuous infusion  Status:  Discontinued        12 Million  Units 41.7 mL/hr over 12 Hours Intravenous Every 12 hours 11/15/21 0405 11/17/21 1956   11/14/21 2200  vancomycin (VANCOREADY) IVPB 750 mg/150 mL  Status:  Discontinued        750 mg 150 mL/hr over 60 Minutes Intravenous Every 12 hours 11/14/21 1524 11/15/21 0405   11/14/21 0900  vancomycin (VANCOCIN) IVPB 1000 mg/200 mL premix        1,000 mg 200 mL/hr over 60 Minutes Intravenous  Once 11/14/21 0849 11/14/21 1043   11/14/21 0900  cefTRIAXone (ROCEPHIN) 2 g in sodium chloride 0.9 % 100 mL IVPB        2 g 200 mL/hr over 30 Minutes Intravenous  Once 11/14/21 0849 11/14/21 0935         Subjective: Laying in bed, breakfast sitting on tray.  Complaining of pain, states waiting on pain medication.  No chest pain.  No shortness of breath.    Objective: Vitals:   11/28/21 0552 11/28/21 1238 11/29/21 0059 11/29/21 0300  BP: (!) 142/65 (!) 172/91 (!) 117/57 (!) 149/88  Pulse: (!) 111 (!) 106 (!) 109 (!) 107  Resp: 16 18 20 20   Temp: 98.1 F (36.7 C) 98 F (36.7 C) 97.8 F (36.6 C) 97.9 F (36.6 C)  TempSrc: Oral Oral Oral Oral  SpO2: 100%  100% 100%  Weight:      Height:        Intake/Output Summary (Last 24 hours) at 11/29/2021 1046 Last data filed at 11/28/2021 2145 Gross per 24 hour  Intake 842.4 ml  Output 1850 ml  Net -1007.6 ml    Filed Weights   11/14/21 0835 11/15/21 1608 11/25/21 1342  Weight: 60 kg 60 kg 60 kg    Examination:  General exam: NAD. Respiratory system: CTA B anterior lung fields.  No wheezes, no crackles, no rhonchi. Cardiovascular system: Regular rate rhythm no murmurs rubs or gallops.  No JVD.  No lower extremity edema.  Gastrointestinal system: Abdomen is soft, nontender, nondistended, positive bowel sounds.  No rebound.  No guarding. Central nervous system: Alert and oriented. No focal neurological deficits. Extremities: Bilateral extremities in bandages. Skin: No rashes, lesions or ulcers Psychiatry: Judgement and insight appear normal. Mood  & affect appropriate.     Data Reviewed: I have personally reviewed following labs and imaging studies  CBC: Recent Labs  Lab 11/23/21 0305 11/24/21 0318 11/25/21 0337 11/27/21 0316 11/29/21 0445  WBC 19.3* 12.6*  --  10.1 8.3  NEUTROABS  --   --   --   --  4.9  HGB 7.1* 6.5* 7.9* 7.8* 7.7*  HCT 22.4* 20.5* 24.6* 25.1* 25.1*  MCV 90.7 89.9  --  91.3 90.6  PLT 794* 888*  --  885* 686*     Basic Metabolic Panel: Recent Labs  Lab 11/24/21 0318 11/27/21 0316 11/29/21 0445  NA 131* 135 134*  K 3.9 4.1 3.4*  CL 100 102 101  CO2 27 28 27   GLUCOSE 113* 115* 123*  BUN 15 25* 23*  CREATININE 0.42* 0.42* 0.42*  CALCIUM 7.6* 8.4* 8.1*  MG  --   --  1.7     GFR: Estimated Creatinine  Clearance: 109.4 mL/min (A) (by C-G formula based on SCr of 0.42 mg/dL (L)).  Liver Function Tests: No results for input(s): AST, ALT, ALKPHOS, BILITOT, PROT, ALBUMIN in the last 168 hours.  CBG: No results for input(s): GLUCAP in the last 168 hours.   No results found for this or any previous visit (from the past 240 hour(s)).       Radiology Studies: No results found.      Scheduled Meds:  (feeding supplement) PROSource Plus  30 mL Oral TID BM   acetaminophen  650 mg Oral Q6H   vitamin C  500 mg Oral Daily   Chlorhexidine Gluconate Cloth  6 each Topical Daily   feeding supplement  237 mL Oral BID BM   ferrous sulfate  325 mg Oral Q breakfast   nicotine  14 mg Transdermal Daily   pantoprazole  40 mg Oral BID   potassium chloride  40 mEq Oral Once   QUEtiapine  100 mg Oral BID   Continuous Infusions:  DAPTOmycin (CUBICIN)  IV 500 mg (11/28/21 2044)   magnesium sulfate bolus IVPB       LOS: 15 days    Time spent: 35 minutes    Irine Seal, MD [facility]   To contact the attending provider between 7A-7P or the covering provider during after hours 7P-7A, please log into the web site [email or web address] and access using universal Happy password for  that web site. If you do not have the password, please call the hospital operator.  11/29/2021, 10:46 AM

## 2021-11-30 DIAGNOSIS — A419 Sepsis, unspecified organism: Secondary | ICD-10-CM | POA: Diagnosis not present

## 2021-11-30 DIAGNOSIS — F15951 Other stimulant use, unspecified with stimulant-induced psychotic disorder with hallucinations: Secondary | ICD-10-CM | POA: Diagnosis not present

## 2021-11-30 DIAGNOSIS — D62 Acute posthemorrhagic anemia: Secondary | ICD-10-CM | POA: Diagnosis not present

## 2021-11-30 DIAGNOSIS — L0390 Cellulitis, unspecified: Secondary | ICD-10-CM | POA: Diagnosis not present

## 2021-11-30 LAB — MAGNESIUM: Magnesium: 1.9 mg/dL (ref 1.7–2.4)

## 2021-11-30 NOTE — Progress Notes (Signed)
Occupational Therapy Treatment Patient Details Name: Jason Buchanan MRN: [id number] DOB: 1986-10-06 Today's Date: 11/30/2021   History of present illness Pt is 35 yo male presented on 12/12 with bil UE edema/erythema after IV drug use.  Pt required R hand fasciotomy, carpal tunnel release, 3 compartment forearm fasciotomy, Guyon's canal release , and I and D hand/forearm on 12/15; and L wrist I and D, left volar forearm fasciotmoy, and L wrist carpal tunnel release on 12/13. On 12/14 concern for GIB and required 3 untis PRBC with ED 12/15.  Pt with hx of polysubstance abuse.   OT comments  Patient was noted to have increased ROM of BUE on this date. Patient reported pain level was 10/10 upon entrance to room with patient transferring from recliner to bed at this time. Patient was able to demonstrate one rep of each part of current HEP during session. Patient and nurse were reeducated on proper positioning of BUE with bilateral UE up on pillows with wrists slightly higher than rest of arm to reduce edema risks. Nurse and patient verbalized understanding. Patient was noted to have had increased improved on transfers and participation in ADLs with patient donning sock on R foot himself. Patient plans to transition to rehab at time of d/c from this SNF.    Recommendations for follow up therapy are one component of a multi-disciplinary discharge planning process, led by the attending physician.  Recommendations may be updated based on patient status, additional functional criteria and insurance authorization.    Follow Up Recommendations  Other (comment) (patient to transition to rehab program at time of d/c.)    Assistance Recommended at Discharge PRN  Equipment Recommendations  None recommended by OT    Recommendations for Other Services      Precautions / Restrictions Precautions Precautions: Fall Precaution Comments: wound vac RUE Restrictions Other Position/Activity Restrictions: no  restrictions on BUE per Charma Igo PA on secure chat 11/21/21       Mobility Bed Mobility Overal bed mobility: Modified Independent                  Transfers                         Balance                                           ADL either performed or assessed with clinical judgement   ADL Overall ADL's : Needs assistance/impaired                                       General ADL Comments: patient was able to transfer from recliner in room to bed with SUP with needed A to move pillows out of way for patient to transition into bed. patient reported increased pain at start of session with nurse entering room to administer pain medications at that time.    Extremity/Trunk Assessment Upper Extremity Assessment Upper Extremity Assessment: RUE deficits/detail;LUE deficits/detail RUE Deficits / Details: noted to have increased redness and edema around hand and elbow with nursing aware. patient and nurse educated on proper positioning. patient was able to range R shoulder on all planes and digits were noted to have at leat 10 degrees more of movement on this date.  LUE Deficits / Details: patient was noted to have ability to make fist and extend digits on UE to about 30 degrees. patient has full range of shoulder and elbow on this date.            Vision       Perception     Praxis      Cognition Arousal/Alertness: Awake/alert Behavior During Therapy: WFL for tasks assessed/performed Overall Cognitive Status: Within Functional Limits for tasks assessed                                            Exercises Other Exercises Other Exercises: patient participated in ROM of BUE to demonstrate improved positioning. patietn was noted to have increased movement of bilateral UEs. patietn was noetd to have increased digit flexion and extension on L side wiht patient tolerating extension of digits at rest  against pillow with no need for added support. Other Exercises: patient demonstrated ability to complete HEP with one rep each secondary to pain levels at this time. Other Exercises: patient was noted to have reduced edema in R palmar surface but increased in elbow and upper arm. patient and nurse were educated on importance of keeping whole arm elevated to prevent pooling in elbow area. patient and nurse verbalized understanding.   Shoulder Instructions       General Comments      Pertinent Vitals/ Pain       Pain Assessment: 0-10 Pain Score: 10-Worst pain ever Pain Location: Arms and ribs Pain Descriptors / Indicators: Discomfort;Grimacing;Guarding Pain Intervention(s): Limited activity within patient's tolerance;Monitored during session;Repositioned  Home Living                                          Prior Functioning/Environment              Frequency  Min 2X/week        Progress Toward Goals  OT Goals(current goals can now be found in the care plan section)  Progress towards OT goals: Progressing toward goals     Plan Discharge plan needs to be updated    Co-evaluation                 AM-PAC OT "6 Clicks" Daily Activity     Outcome Measure   Help from another person eating meals?: None Help from another person taking care of personal grooming?: None Help from another person toileting, which includes using toliet, bedpan, or urinal?: A Little Help from another person bathing (including washing, rinsing, drying)?: A Little Help from another person to put on and taking off regular upper body clothing?: A Little Help from another person to put on and taking off regular lower body clothing?: A Little 6 Click Score: 20    End of Session    OT Visit Diagnosis: Muscle weakness (generalized) (M62.81);Feeding difficulties (R63.3);Pain   Activity Tolerance Patient tolerated treatment well   Patient Left in bed;with call bell/phone  within reach;with bed alarm set   Nurse Communication Other (comment) (positioning of BUE)        Time: 1610-9604 OT Time Calculation (min): 30 min  Charges: OT General Charges $OT Visit: 1 Visit OT Treatments $Therapeutic Activity: 23-37 mins  Sharyn Blitz OTR/L, MS Acute Rehabilitation Department Office# [phone]  Pager# [phone]   Ardyth Harps 11/30/2021, 3:48 PM

## 2021-11-30 NOTE — Progress Notes (Signed)
PROGRESS NOTE    KASE SHUGHART   OIN:[id number]  DOB: [birth year]  DOA: 11/14/2021 PCP: Lavinia Sharps, NP   Brief Narrative:  [name]. Hochmuth was admitted to the hospital with the working diagnosis of severe sepsis due to right upper extremities cellulitis, complicated with streptococcus bacteremia.    35 yo male with the past medical history of polysubstance abuse who presented with bilateral arm pain. Patient has a h/o of injecting multiple drugs in both arms.   CT scan of upper extremities showed extensive cellulitis and he was placed on broad-spectrum antibiotic therapy.   12/13 I&D of bilateral upper extremities. Blood culture positive for Streptococcus group B.   12/15 patient developed melena with acute blood loss, hemoglobin down to 5.8. He received 2 units packed red blood cell transfusion and was placed on intravenous pantoprazole. 12/15 upper endoscopy showing mild proximal mid esophagitis.  Nonbleeding gastric ulcers with no stigmata of bleeding.  Positive adherent clot.  Nonbleeding duodenal ulcers with adherent clot.  Ulcers were injected. He had postprocedure respiratory failure that required invasive mechanical ventilation.  Extubated late 12/15.   12/20 Incision and drainage and debridement of right upper extremity, incision and drainage of left upper extremity.   12/23 bilateral arms I&D, the OR under anesthesia.     Subjective: Pain is reasonably well controlled on IV and oral pain meds    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, strep pyogenes bacteremia, left hand wound culture infected with staph aureus - Status post multiple I&D's as mentioned above - Hand surgery would like to continue to keep him in the hospital on IV antibiotics and has plans to do further I&D's - Continue IV and oral pain control as it is - Per ID recommendations, continue daptomycin during hospitalization with plans to transition to  Zyvox for 30 days when he is discharged  Upper GI bleed with acute blood loss secondary to gastric and duodenal ulcers - States that he was taking multiple NSAIDs prior to hospitalization to help control his arm pain -He has received 4 units of packed red blood cells - He is stabilized now and knows to avoid NSAIDs - Continue PPI twice daily  Anemia due to acute blood loss from upper extremities Transfuse to keep hemoglobin greater than 7  Multiple electrolyte abnormalities - Continue to follow sodium, potassium magnesium and replace as needed  Severe protein calorie malnutrition - Continue supplements  Polysubstance abuse tobacco abuse, substance abuse mood disorder - Continue Seroquel and trazodone as recommended by psychiatry  Time spent in minutes: 35 DVT prophylaxis: SCDs Start: 11/14/21 1835  Code Status: Full code Family Communication:  Level of Care: Level of care: Med-Surg Disposition Plan:  Status is: Inpatient  Remains inpatient appropriate because: On IV antibiotics and will require more surgery    Consultants:  Hand surgeon: Dr. Izora Ribas 11/14/2021 Orthopedics: Dr. Kristine Linea 11/14/2021 ID: Dr.Comer, 11/15/2021 Gastroenterology: Dr. Ewing Schlein 11/16/2021 PCCM: Dr. Katrinka Blazing 11/17/2021 Psychiatry: Dr. Jannifer Franklin 11/19/2021 Wound care RN Procedures:   Incision and drainage and debridement right upper extremity/incision and drainage of left upper extremity by Dr. Izora Ribas hand surgeon 11/23/2021. Wound evaluation left upper extremity with dressing change under sedation/incision and drainage right upper extremity and loose closure of wound, right upper extremity per Dr. Izora Ribas, hand surgeon 11/18/2021 Right hand fasciotomy/right hand carpal tunnel release/right hand renal scan no release/right forearm 3 compartment fasciotomy/thorough irrigation of hand and forearm/dorsal hand incision and drainage including extensor tendon  sheath per Dr. Izora Ribas, hand surgeon 11/17/2021 Central line  insertion per PCCM, Dr. Katrinka Blazing 11/17/2021 Upper endoscopy per GI, Dr. Ewing Schlein 11/17/2021 Left wrist deep abscess incision and drainage/left volar forearm fasciotomy/left wrist carpal tunnel release extended with Guyon's canal release--per Dr. Yehuda Budd orthopedics 11/15/2021 Antimicrobials:  Anti-infectives (From admission, onward)    Start     Dose/Rate Route Frequency Ordered Stop   11/17/21 1700  cefTRIAXone (ROCEPHIN) 2 g in sodium chloride 0.9 % 100 mL IVPB  Status:  Discontinued        2 g 200 mL/hr over 30 Minutes Intravenous Every 24 hours 11/17/21 1019 11/21/21 1047   11/17/21 1700  DAPTOmycin (CUBICIN) 500 mg in sodium chloride 0.9 % IVPB        8 mg/kg  60 kg 120 mL/hr over 30 Minutes Intravenous Daily 11/17/21 1555     11/17/21 1200  metroNIDAZOLE (FLAGYL) IVPB 500 mg  Status:  Discontinued        500 mg 100 mL/hr over 60 Minutes Intravenous Every 12 hours 11/17/21 1019 11/18/21 0924   11/15/21 1000  cefTRIAXone (ROCEPHIN) 2 g in sodium chloride 0.9 % 100 mL IVPB  Status:  Discontinued        2 g 200 mL/hr over 30 Minutes Intravenous Every 24 hours 11/14/21 1834 11/15/21 0405   11/15/21 1000  clindamycin (CLEOCIN) IVPB 600 mg  Status:  Discontinued        600 mg 100 mL/hr over 30 Minutes Intravenous Every 8 hours 11/15/21 0405 11/15/21 1523   11/15/21 0800  penicillin G potassium 12 Million Units in dextrose 5 % 500 mL continuous infusion  Status:  Discontinued        12 Million Units 41.7 mL/hr over 12 Hours Intravenous Every 12 hours 11/15/21 0405 11/17/21 1956   11/14/21 2200  vancomycin (VANCOREADY) IVPB 750 mg/150 mL  Status:  Discontinued        750 mg 150 mL/hr over 60 Minutes Intravenous Every 12 hours 11/14/21 1524 11/15/21 0405   11/14/21 0900  vancomycin (VANCOCIN) IVPB 1000 mg/200 mL premix        1,000 mg 200 mL/hr over 60 Minutes Intravenous  Once 11/14/21 0849 11/14/21 1043   11/14/21 0900  cefTRIAXone (ROCEPHIN) 2 g in sodium chloride 0.9 % 100 mL IVPB        2  g 200 mL/hr over 30 Minutes Intravenous  Once 11/14/21 0849 11/14/21 0935        Objective: Vitals:   11/29/21 1403 11/29/21 1432 11/29/21 2001 11/30/21 0427  BP: (!) 172/108 140/69 (!) 173/103 (!) 161/111  Pulse: (!) 112 (!) 108 (!) 110 (!) 109  Resp: 18 15 18 18   Temp: 98.1 F (36.7 C) 97.9 F (36.6 C) 98.3 F (36.8 C) 98.5 F (36.9 C)  TempSrc: Oral Oral Oral Oral  SpO2: 100% 100% 100% 100%  Weight:      Height:        Intake/Output Summary (Last 24 hours) at 11/30/2021 1552 Last data filed at 11/30/2021 0700 Gross per 24 hour  Intake 540 ml  Output 50 ml  Net 490 ml   Filed Weights   11/14/21 0835 11/15/21 1608 11/25/21 1342  Weight: 60 kg 60 kg 60 kg    Examination: General exam: Appears comfortable  HEENT: PERRLA, oral mucosa moist, no sclera icterus or thrush Respiratory system: Clear to auscultation. Respiratory effort normal. Cardiovascular system: S1 & S2 heard, RRR.   Gastrointestinal system: Abdomen soft, non-tender, nondistended. Normal bowel sounds.  Central nervous system: Alert and oriented. No focal neurological deficits. Extremities: No cyanosis, clubbing or edema-upper extremities are in dressings Skin: No rashes or ulcers Psychiatry:  Mood & affect appropriate.     Data Reviewed: I have personally reviewed following labs and imaging studies  CBC: Recent Labs  Lab 11/24/21 0318 11/25/21 0337 11/27/21 0316 11/29/21 0445  WBC 12.6*  --  10.1 8.3  NEUTROABS  --   --   --  4.9  HGB 6.5* 7.9* 7.8* 7.7*  HCT 20.5* 24.6* 25.1* 25.1*  MCV 89.9  --  91.3 90.6  PLT 888*  --  885* 686*   Basic Metabolic Panel: Recent Labs  Lab 11/24/21 0318 11/27/21 0316 11/29/21 0445 11/30/21 0302  NA 131* 135 134*  --   K 3.9 4.1 3.4*  --   CL 100 102 101  --   CO2 27 28 27   --   GLUCOSE 113* 115* 123*  --   BUN 15 25* 23*  --   CREATININE 0.42* 0.42* 0.42*  --   CALCIUM 7.6* 8.4* 8.1*  --   MG  --   --  1.7 1.9   GFR: Estimated Creatinine  Clearance: 109.4 mL/min (A) (by C-G formula based on SCr of 0.42 mg/dL (L)). Liver Function Tests: No results for input(s): AST, ALT, ALKPHOS, BILITOT, PROT, ALBUMIN in the last 168 hours. No results for input(s): LIPASE, AMYLASE in the last 168 hours. No results for input(s): AMMONIA in the last 168 hours. Coagulation Profile: No results for input(s): INR, PROTIME in the last 168 hours. Cardiac Enzymes: Recent Labs  Lab 11/25/21 0337  CKTOTAL 18*   BNP (last 3 results) No results for input(s): PROBNP in the last 8760 hours. HbA1C: No results for input(s): HGBA1C in the last 72 hours. CBG: No results for input(s): GLUCAP in the last 168 hours. Lipid Profile: No results for input(s): CHOL, HDL, LDLCALC, TRIG, CHOLHDL, LDLDIRECT in the last 72 hours. Thyroid Function Tests: No results for input(s): TSH, T4TOTAL, FREET4, T3FREE, THYROIDAB in the last 72 hours. Anemia Panel: No results for input(s): VITAMINB12, FOLATE, FERRITIN, TIBC, IRON, RETICCTPCT in the last 72 hours. Urine analysis:    Component Value Date/Time   COLORURINE STRAW (A) 11/14/2021 1107   APPEARANCEUR CLEAR 11/14/2021 1107   LABSPEC 1.018 11/14/2021 1107   PHURINE 7.0 11/14/2021 1107   GLUCOSEU NEGATIVE 11/14/2021 1107   HGBUR MODERATE (A) 11/14/2021 1107   BILIRUBINUR NEGATIVE 11/14/2021 1107   KETONESUR 5 (A) 11/14/2021 1107   PROTEINUR NEGATIVE 11/14/2021 1107   UROBILINOGEN 0.2 03/08/2011 1230   NITRITE NEGATIVE 11/14/2021 1107   LEUKOCYTESUR NEGATIVE 11/14/2021 1107   Sepsis Labs: @LABRCNTIP (procalcitonin:4,lacticidven:4) )No results found for this or any previous visit (from the past 240 hour(s)).       Radiology Studies: No results found.    Scheduled Meds:  (feeding supplement) PROSource Plus  30 mL Oral TID BM   acetaminophen  650 mg Oral Q6H   vitamin C  500 mg Oral Daily   Chlorhexidine Gluconate Cloth  6 each Topical Daily   feeding supplement  237 mL Oral BID BM   ferrous sulfate   325 mg Oral Q breakfast   nicotine  14 mg Transdermal Daily   pantoprazole  40 mg Oral BID   QUEtiapine  100 mg Oral BID   Continuous Infusions:  DAPTOmycin (CUBICIN)  IV 500 mg (11/29/21 1956)     LOS: 16 days      , MD  [facility] Pager: [email or web address] 11/30/2021, 3:52 PM

## 2021-12-01 ENCOUNTER — Inpatient Hospital Stay (HOSPITAL_COMMUNITY): Payer: 59 | Admitting: Certified Registered Nurse Anesthetist

## 2021-12-01 ENCOUNTER — Encounter (HOSPITAL_COMMUNITY): Payer: Self-pay | Admitting: Internal Medicine

## 2021-12-01 ENCOUNTER — Encounter (HOSPITAL_COMMUNITY)
Admission: EM | Disposition: A | Discharge status: Home / Self Care | Payer: Self-pay | Source: Home / Self Care | Attending: Internal Medicine

## 2021-12-01 DIAGNOSIS — F15951 Other stimulant use, unspecified with stimulant-induced psychotic disorder with hallucinations: Secondary | ICD-10-CM | POA: Diagnosis not present

## 2021-12-01 HISTORY — PX: I & D EXTREMITY: SHX5045

## 2021-12-01 SURGERY — IRRIGATION AND DEBRIDEMENT EXTREMITY
Anesthesia: General | Site: Arm Lower | Laterality: Bilateral

## 2021-12-01 MED ORDER — ONDANSETRON HCL 4 MG/2ML IJ SOLN
INTRAMUSCULAR | Status: DC | PRN
  Administered 2021-12-01: 4 mg via INTRAVENOUS

## 2021-12-01 MED ORDER — OXYCODONE HCL 5 MG/5ML PO SOLN: 5.0000 mg | Freq: Once | ORAL | Status: DC | PRN

## 2021-12-01 MED ORDER — CHLORHEXIDINE GLUCONATE 0.12 % MT SOLN
15.0000 mL | Freq: Once | OROMUCOSAL | Status: AC
  Administered 2021-12-01: 17:00:00 15 mL via OROMUCOSAL

## 2021-12-01 MED ORDER — ACETAMINOPHEN 160 MG/5ML PO SOLN: 1000.0000 mg | Freq: Once | ORAL | Status: DC | PRN

## 2021-12-01 MED ORDER — ACETAMINOPHEN 500 MG PO TABS: 1000.0000 mg | ORAL_TABLET | Freq: Once | ORAL | Status: DC | PRN

## 2021-12-01 MED ORDER — ACETAMINOPHEN 10 MG/ML IV SOLN: 1000.0000 mg | Freq: Once | INTRAVENOUS | Status: DC | PRN

## 2021-12-01 MED ORDER — ONDANSETRON HCL 4 MG/2ML IJ SOLN
INTRAMUSCULAR | Status: AC
  Filled 2021-12-01: qty 2

## 2021-12-01 MED ORDER — KETAMINE HCL 10 MG/ML IJ SOLN
INTRAMUSCULAR | Status: AC
  Filled 2021-12-01: qty 1

## 2021-12-01 MED ORDER — FENTANYL CITRATE (PF) 100 MCG/2ML IJ SOLN
INTRAMUSCULAR | Status: DC | PRN
  Administered 2021-12-01 (×4): 50 ug via INTRAVENOUS

## 2021-12-01 MED ORDER — FENTANYL CITRATE (PF) 100 MCG/2ML IJ SOLN
INTRAMUSCULAR | Status: AC
  Filled 2021-12-01: qty 2

## 2021-12-01 MED ORDER — DEXMEDETOMIDINE (PRECEDEX) IN NS 20 MCG/5ML (4 MCG/ML) IV SYRINGE
PREFILLED_SYRINGE | INTRAVENOUS | Status: AC
  Filled 2021-12-01: qty 5

## 2021-12-01 MED ORDER — LIDOCAINE 2% (20 MG/ML) 5 ML SYRINGE
INTRAMUSCULAR | Status: DC | PRN
  Administered 2021-12-01: 40 mg via INTRAVENOUS

## 2021-12-01 MED ORDER — DEXMEDETOMIDINE (PRECEDEX) IN NS 20 MCG/5ML (4 MCG/ML) IV SYRINGE
PREFILLED_SYRINGE | INTRAVENOUS | Status: DC | PRN
  Administered 2021-12-01: 8 ug via INTRAVENOUS
  Administered 2021-12-01: 4 ug via INTRAVENOUS

## 2021-12-01 MED ORDER — DEXAMETHASONE SODIUM PHOSPHATE 10 MG/ML IJ SOLN
INTRAMUSCULAR | Status: AC
  Filled 2021-12-01: qty 1

## 2021-12-01 MED ORDER — MIDAZOLAM HCL 5 MG/5ML IJ SOLN
INTRAMUSCULAR | Status: DC | PRN
  Administered 2021-12-01: 2 mg via INTRAVENOUS

## 2021-12-01 MED ORDER — PROPOFOL 10 MG/ML IV BOLUS
INTRAVENOUS | Status: DC | PRN
  Administered 2021-12-01: 140 mg via INTRAVENOUS

## 2021-12-01 MED ORDER — HYDROMORPHONE HCL 1 MG/ML IJ SOLN
2.0000 mg | INTRAMUSCULAR | Status: AC
  Administered 2021-12-01: 18:00:00 2 mg via INTRAVENOUS

## 2021-12-01 MED ORDER — HYDROMORPHONE HCL 1 MG/ML IJ SOLN: 0.2500 mg | INTRAMUSCULAR | Status: DC | PRN

## 2021-12-01 MED ORDER — KETAMINE HCL 10 MG/ML IJ SOLN
INTRAMUSCULAR | Status: DC | PRN
  Administered 2021-12-01: 30 mg via INTRAVENOUS
  Administered 2021-12-01: 10 mg via INTRAVENOUS

## 2021-12-01 MED ORDER — OXYCODONE HCL 5 MG PO TABS: 5.0000 mg | ORAL_TABLET | Freq: Once | ORAL | Status: DC | PRN

## 2021-12-01 MED ORDER — LACTATED RINGERS IV SOLN: INTRAVENOUS | Status: DC

## 2021-12-01 MED ORDER — MIDAZOLAM HCL 2 MG/2ML IJ SOLN
INTRAMUSCULAR | Status: AC
  Filled 2021-12-01: qty 2

## 2021-12-01 MED ORDER — SODIUM CHLORIDE 0.9 % IR SOLN
Status: DC | PRN
  Administered 2021-12-01: 6000 mL

## 2021-12-01 MED ORDER — DEXAMETHASONE SODIUM PHOSPHATE 10 MG/ML IJ SOLN
INTRAMUSCULAR | Status: DC | PRN
  Administered 2021-12-01: 4 mg via INTRAVENOUS

## 2021-12-01 MED ORDER — PROPOFOL 10 MG/ML IV BOLUS
INTRAVENOUS | Status: AC
  Filled 2021-12-01: qty 20

## 2021-12-01 SURGICAL SUPPLY — 38 items
BNDG ELASTIC 4X5.8 VLCR STR LF (GAUZE/BANDAGES/DRESSINGS) ×3 IMPLANT
CORD BIPOLAR FORCEPS 12FT (ELECTRODE) IMPLANT
COVER BACK TABLE 60X90IN (DRAPES) ×3 IMPLANT
CUFF TOURN SGL QUICK 18 (TOURNIQUET CUFF) ×4 IMPLANT
CUFF TOURN SGL QUICK 18X4 (TOURNIQUET CUFF) ×4 IMPLANT
DRAPE EXTREMITY T 121X128X90 (DISPOSABLE) ×2 IMPLANT
DRAPE ORTHO SPLIT 77X108 STRL (DRAPES) ×6
DRAPE SHEET LG 3/4 BI-LAMINATE (DRAPES) ×3 IMPLANT
DRAPE SURG 17X23 STRL (DRAPES) ×3 IMPLANT
DRAPE SURG ORHT 6 SPLT 77X108 (DRAPES) ×3 IMPLANT
DRSG VAC ATS MED SENSATRAC (GAUZE/BANDAGES/DRESSINGS) ×2 IMPLANT
DRSG VERSA FOAM LRG 10X15 (GAUZE/BANDAGES/DRESSINGS) ×2 IMPLANT
ELECT REM PT RETURN 15FT ADLT (MISCELLANEOUS) IMPLANT
GAUZE 4X4 16PLY ~~LOC~~+RFID DBL (SPONGE) ×3 IMPLANT
GAUZE SPONGE 4X4 12PLY STRL (GAUZE/BANDAGES/DRESSINGS) ×3 IMPLANT
GAUZE XEROFORM 1X8 LF (GAUZE/BANDAGES/DRESSINGS) IMPLANT
GAUZE XEROFORM 5X9 LF (GAUZE/BANDAGES/DRESSINGS) ×2 IMPLANT
GOWN STRL REUS W/ TWL LRG LVL3 (GOWN DISPOSABLE) ×2 IMPLANT
GOWN STRL REUS W/TWL LRG LVL3 (GOWN DISPOSABLE) ×3
HANDPIECE INTERPULSE COAX TIP (DISPOSABLE)
HIBICLENS CHG 4% 4OZ BTL (MISCELLANEOUS) ×3 IMPLANT
KIT BASIN OR (CUSTOM PROCEDURE TRAY) ×3 IMPLANT
KIT TURNOVER CYSTO (KITS) ×3 IMPLANT
MANIFOLD NEPTUNE II (INSTRUMENTS) ×3 IMPLANT
NDL HYPO 25X1 1.5 SAFETY (NEEDLE) IMPLANT
NEEDLE HYPO 25X1 1.5 SAFETY (NEEDLE) IMPLANT
NS IRRIG 1000ML POUR BTL (IV SOLUTION) ×3 IMPLANT
SET HNDPC FAN SPRY TIP SCT (DISPOSABLE) IMPLANT
SET IRRIG Y TYPE TUR BLADDER L (SET/KITS/TRAYS/PACK) ×4 IMPLANT
SPONGE T-LAP 4X18 ~~LOC~~+RFID (SPONGE) ×2 IMPLANT
STAPLER VISISTAT (STAPLE) ×4 IMPLANT
SUT ETHILON 4 0 PS 2 18 (SUTURE) IMPLANT
SYR 10ML LL (SYRINGE) ×3 IMPLANT
SYR BULB EAR ULCER 3OZ GRN STR (SYRINGE) ×3 IMPLANT
TOWEL OR 17X26 10 PK STRL BLUE (TOWEL DISPOSABLE) ×3 IMPLANT
TUBING CONNECTING 10 (TUBING) ×5 IMPLANT
UNDERPAD 30X36 HEAVY ABSORB (UNDERPADS AND DIAPERS) ×3 IMPLANT
YANKAUER SUCT BULB TIP NO VENT (SUCTIONS) ×5 IMPLANT

## 2021-12-01 NOTE — Progress Notes (Signed)
PROGRESS NOTE    Jason Buchanan   OQH:[id number]  DOB: 1986-04-28  DOA: 11/14/2021 PCP: Lavinia Sharps, NP   Brief Narrative:  Jason Buchanan was admitted to the hospital with the working diagnosis of severe sepsis due to right upper extremities cellulitis, complicated with streptococcus bacteremia.    35 yo male with the past medical history of polysubstance abuse who presented with bilateral arm pain. Patient has a h/o of injecting multiple drugs in both arms.   CT scan of upper extremities showed extensive cellulitis and he was placed on broad-spectrum antibiotic therapy.   12/13 I&D of bilateral upper extremities. Blood culture positive for Streptococcus group B.   12/15 patient developed melena with acute blood loss, hemoglobin down to 5.8. He received 2 units packed red blood cell transfusion and was placed on intravenous pantoprazole. 12/15 upper endoscopy showing mild proximal mid esophagitis.  Nonbleeding gastric ulcers with no stigmata of bleeding.  Positive adherent clot.  Nonbleeding duodenal ulcers with adherent clot.  Ulcers were injected. He had postprocedure respiratory failure that required invasive mechanical ventilation.  Extubated late 12/15.   12/20 Incision and drainage and debridement of right upper extremity, incision and drainage of left upper extremity.   12/23 bilateral arms I&D, the OR under anesthesia.     Subjective: No new complaints.    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, strep pyogenes bacteremia, left hand wound culture infected with staph aureus - Status post multiple I&D's as mentioned above - Hand surgery would like to continue to keep him in the hospital on IV antibiotics and has plans to do further I&D's today - Continue IV and oral pain control as it is - Per ID recommendations, continue daptomycin during hospitalization with plans to transition to Zyvox for 30 days when he is  discharged  Upper GI bleed with acute blood loss secondary to gastric and duodenal ulcers - States that he was taking multiple NSAIDs prior to hospitalization to help control his arm pain -He has received 4 units of packed red blood cells - He is stabilized now and knows to avoid NSAIDs - Continue PPI twice daily  Anemia due to acute blood loss from upper extremities Transfuse to keep hemoglobin greater than 7  Multiple electrolyte abnormalities - Continue to follow sodium, potassium magnesium and replace as needed  Severe protein calorie malnutrition - Continue supplements  Polysubstance abuse tobacco abuse, substance abuse mood disorder - Continue Seroquel and trazodone as recommended by psychiatry  Time spent in minutes: 35 DVT prophylaxis: SCDs Start: 11/14/21 1835  Code Status: Full code Family Communication:  Level of Care: Level of care: Med-Surg Disposition Plan:  Status is: Inpatient  Remains inpatient appropriate because: On IV antibiotics and will require more surgery    Consultants:  Hand surgeon: Dr. Izora Ribas 11/14/2021 Orthopedics: Dr. Kristine Linea 11/14/2021 ID: Dr.Comer, 11/15/2021 Gastroenterology: Dr. Ewing Schlein 11/16/2021 PCCM: Dr. Katrinka Blazing 11/17/2021 Psychiatry: Dr. Jannifer Franklin 11/19/2021 Wound care RN Procedures:   Incision and drainage and debridement right upper extremity/incision and drainage of left upper extremity by Dr. Izora Ribas hand surgeon 11/23/2021. Wound evaluation left upper extremity with dressing change under sedation/incision and drainage right upper extremity and loose closure of wound, right upper extremity per Dr. Izora Ribas, hand surgeon 11/18/2021 Right hand fasciotomy/right hand carpal tunnel release/right hand renal scan no release/right forearm 3 compartment fasciotomy/thorough irrigation of hand and forearm/dorsal hand incision and drainage including extensor tendon sheath per Dr. Izora Ribas, hand surgeon 11/17/2021  Central line insertion per PCCM, Dr. Katrinka Blazing  11/17/2021 Upper endoscopy per GI, Dr. Ewing Schlein 11/17/2021 Left wrist deep abscess incision and drainage/left volar forearm fasciotomy/left wrist carpal tunnel release extended with Guyon's canal release--per Dr. Yehuda Budd orthopedics 11/15/2021 Antimicrobials:  Anti-infectives (From admission, onward)    Start     Dose/Rate Route Frequency Ordered Stop   11/17/21 1700  cefTRIAXone (ROCEPHIN) 2 g in sodium chloride 0.9 % 100 mL IVPB  Status:  Discontinued        2 g 200 mL/hr over 30 Minutes Intravenous Every 24 hours 11/17/21 1019 11/21/21 1047   11/17/21 1700  DAPTOmycin (CUBICIN) 500 mg in sodium chloride 0.9 % IVPB        8 mg/kg  60 kg 120 mL/hr over 30 Minutes Intravenous Daily 11/17/21 1555     11/17/21 1200  metroNIDAZOLE (FLAGYL) IVPB 500 mg  Status:  Discontinued        500 mg 100 mL/hr over 60 Minutes Intravenous Every 12 hours 11/17/21 1019 11/18/21 0924   11/15/21 1000  cefTRIAXone (ROCEPHIN) 2 g in sodium chloride 0.9 % 100 mL IVPB  Status:  Discontinued        2 g 200 mL/hr over 30 Minutes Intravenous Every 24 hours 11/14/21 1834 11/15/21 0405   11/15/21 1000  clindamycin (CLEOCIN) IVPB 600 mg  Status:  Discontinued        600 mg 100 mL/hr over 30 Minutes Intravenous Every 8 hours 11/15/21 0405 11/15/21 1523   11/15/21 0800  penicillin G potassium 12 Million Units in dextrose 5 % 500 mL continuous infusion  Status:  Discontinued        12 Million Units 41.7 mL/hr over 12 Hours Intravenous Every 12 hours 11/15/21 0405 11/17/21 1956   11/14/21 2200  vancomycin (VANCOREADY) IVPB 750 mg/150 mL  Status:  Discontinued        750 mg 150 mL/hr over 60 Minutes Intravenous Every 12 hours 11/14/21 1524 11/15/21 0405   11/14/21 0900  vancomycin (VANCOCIN) IVPB 1000 mg/200 mL premix        1,000 mg 200 mL/hr over 60 Minutes Intravenous  Once 11/14/21 0849 11/14/21 1043   11/14/21 0900  cefTRIAXone (ROCEPHIN) 2 g in sodium chloride 0.9 % 100 mL IVPB        2 g 200 mL/hr over 30 Minutes  Intravenous  Once 11/14/21 0849 11/14/21 0935        Objective: Vitals:   11/30/21 1631 11/30/21 2145 12/01/21 0534 12/01/21 1329  BP: (!) 162/93 (!) 147/87 (!) 142/69 139/74  Pulse: (!) 110 (!) 110 (!) 110 (!) 107  Resp: 20 19 18 18   Temp: 97.8 F (36.6 C) 98.1 F (36.7 C) 97.9 F (36.6 C) (!) 97.5 F (36.4 C)  TempSrc:   Oral Oral  SpO2: 100% 96% 99% 100%  Weight:      Height:        Intake/Output Summary (Last 24 hours) at 12/01/2021 1429 Last data filed at 12/01/2021 1300 Gross per 24 hour  Intake 300 ml  Output 1400 ml  Net -1100 ml    Filed Weights   11/14/21 0835 11/15/21 1608 11/25/21 1342  Weight: 60 kg 60 kg 60 kg    Examination: General exam: Appears comfortable  HEENT: PERRLA, oral mucosa moist, no sclera icterus or thrush Respiratory system: Clear to auscultation. Respiratory effort normal. Cardiovascular system: S1 & S2 heard, regular rate and rhythm Gastrointestinal system: Abdomen soft, non-tender, nondistended. Normal bowel sounds   Central nervous  system: Alert and oriented. No focal neurological deficits. Extremities: No cyanosis, clubbing or edema- dressing on arms not opened Skin: No rashes or ulcers Psychiatry:  Mood & affect appropriate.       Data Reviewed: I have personally reviewed following labs and imaging studies  CBC: Recent Labs  Lab 11/25/21 0337 11/27/21 0316 11/29/21 0445  WBC  --  10.1 8.3  NEUTROABS  --   --  4.9  HGB 7.9* 7.8* 7.7*  HCT 24.6* 25.1* 25.1*  MCV  --  91.3 90.6  PLT  --  885* 686*    Basic Metabolic Panel: Recent Labs  Lab 11/27/21 0316 11/29/21 0445 11/30/21 0302  NA 135 134*  --   K 4.1 3.4*  --   CL 102 101  --   CO2 28 27  --   GLUCOSE 115* 123*  --   BUN 25* 23*  --   CREATININE 0.42* 0.42*  --   CALCIUM 8.4* 8.1*  --   MG  --  1.7 1.9    GFR: Estimated Creatinine Clearance: 109.4 mL/min (A) (by C-G formula based on SCr of 0.42 mg/dL (L)). Liver Function Tests: No results for  input(s): AST, ALT, ALKPHOS, BILITOT, PROT, ALBUMIN in the last 168 hours. No results for input(s): LIPASE, AMYLASE in the last 168 hours. No results for input(s): AMMONIA in the last 168 hours. Coagulation Profile: No results for input(s): INR, PROTIME in the last 168 hours. Cardiac Enzymes: Recent Labs  Lab 11/25/21 0337  CKTOTAL 18*    BNP (last 3 results) No results for input(s): PROBNP in the last 8760 hours. HbA1C: No results for input(s): HGBA1C in the last 72 hours. CBG: No results for input(s): GLUCAP in the last 168 hours. Lipid Profile: No results for input(s): CHOL, HDL, LDLCALC, TRIG, CHOLHDL, LDLDIRECT in the last 72 hours. Thyroid Function Tests: No results for input(s): TSH, T4TOTAL, FREET4, T3FREE, THYROIDAB in the last 72 hours. Anemia Panel: No results for input(s): VITAMINB12, FOLATE, FERRITIN, TIBC, IRON, RETICCTPCT in the last 72 hours. Urine analysis:    Component Value Date/Time   COLORURINE STRAW (A) 11/14/2021 1107   APPEARANCEUR CLEAR 11/14/2021 1107   LABSPEC 1.018 11/14/2021 1107   PHURINE 7.0 11/14/2021 1107   GLUCOSEU NEGATIVE 11/14/2021 1107   HGBUR MODERATE (A) 11/14/2021 1107   BILIRUBINUR NEGATIVE 11/14/2021 1107   KETONESUR 5 (A) 11/14/2021 1107   PROTEINUR NEGATIVE 11/14/2021 1107   UROBILINOGEN 0.2 03/08/2011 1230   NITRITE NEGATIVE 11/14/2021 1107   LEUKOCYTESUR NEGATIVE 11/14/2021 1107   Sepsis Labs: @LABRCNTIP (procalcitonin:4,lacticidven:4) )No results found for this or any previous visit (from the past 240 hour(s)).       Radiology Studies: No results found.    Scheduled Meds:  (feeding supplement) PROSource Plus  30 mL Oral TID BM   acetaminophen  650 mg Oral Q6H   vitamin C  500 mg Oral Daily   Chlorhexidine Gluconate Cloth  6 each Topical Daily   feeding supplement  237 mL Oral BID BM   ferrous sulfate  325 mg Oral Q breakfast   nicotine  14 mg Transdermal Daily   pantoprazole  40 mg Oral BID   QUEtiapine  100  mg Oral BID   Continuous Infusions:  DAPTOmycin (CUBICIN)  IV 500 mg (11/30/21 2111)     LOS: 17 days      [id number], MD [facility] Pager: [email or web address] 12/01/2021, 2:29 PM

## 2021-12-01 NOTE — Progress Notes (Signed)
Returned from [employer] at 2100

## 2021-12-01 NOTE — Progress Notes (Signed)
Patient evaluated and no change since last evaluation.  Plan for bilateral upper extremity irrigation and debridement.  Plan for possible vacuum dressing change versus partial closure versus advancement flap closure of bilateral upper extremities.  Drainage of the deep abscess of the wrist, forearm, hand will be performed as needed as well as all indicated procedures.  Risks and benefits were discussed with the patient and he elects to proceed with surgery.  Mathis Dad, MD Orthopaedic Hand Surgeon

## 2021-12-01 NOTE — Progress Notes (Signed)
Nutrition Follow-up  DOCUMENTATION CODES:   Severe malnutrition in context of acute illness/injury  INTERVENTION:   -Recommend updated weight  -Ensure Enlive po BID, each supplement provides 350 kcal and 20 grams of protein    -Multivitamin with minerals daily  -500 mg Vitamin daily   -Prosource Plus PO TID, each provides 100 kcals and 15g protein   NUTRITION DIAGNOSIS:   Severe Malnutrition related to acute illness, wound healing as evidenced by severe fat depletion, severe muscle depletion.  Ongoing.  GOAL:   Patient will meet greater than or equal to 90% of their needs  Not meeting, NPO for frequent procedures  MONITOR:   PO intake, Supplement acceptance, Labs, Weight trends, I & O's, Skin  ASSESSMENT:   35 year old man with history of Polysubstance abuse who presented to the hospital with arm swelling, erythema and pain. Patient is a poor historian but  reports history of injecting multiple drugs in both arms and currently receiving treatment for severe sepsis of right upper extremities complicated with streptococcus bacteremia.  12/12 admitted, NPO ->HH diet 12/13: NPO, s/p I&D of left wrist deep abscess, left volar forearm fasciotomy, left wrist carpal tunnel release 12/14: Regular ->NPO 12/15: s/p right hand fasciotomy, right hand carpal tunnel release, right forearm fasciotomy, I&D of hand and forearm, s/p EGD -esophagitis, gastric and duodenal ulcers 12/17: regular diet 12/20: I&D of BUEs 12/23: I&D of bilateral arms  Patient currently NPO for further I&D of BUEs. Pt consumes 100% of his meals when on a diet and accepts his protein supplements.   Admission weight: 132 lbs. No new weights recorded.  Medications: Vitamin C, Ferrous sulfate  Labs reviewed:  Low Na, K  Diet Order:   Diet Order             Diet NPO time specified Except for: Sips with Meds  Diet effective now                   EDUCATION NEEDS:   Education needs have been  addressed  Skin:  Skin Assessment: Skin Integrity Issues: Skin Integrity Issues:: Incisions Incisions: 12/13: bilateral arms  12/15: right arm 12/20 & 12/23 bilateral arms  Last BM:  12/29  Height:   Ht Readings from Last 1 Encounters:  11/25/21 5\' 7"  (1.702 m)    Weight:   Wt Readings from Last 1 Encounters:  11/25/21 60 kg    BMI:  Body mass index is 20.72 kg/m.  Estimated Nutritional Needs:   Kcal:  1800-2000  Protein:  95-110g  Fluid:  2L/day  11/27/21, MS, RD, LDN Inpatient Clinical Dietitian Contact information available via Amion

## 2021-12-01 NOTE — Transfer of Care (Signed)
Immediate Anesthesia Transfer of Care Note  Patient: Jason Buchanan  Procedure(s) Performed: INCISION AND DRAINAGE BILATERAL UPPER EXTREMITIES WITH  WOUND VAC CHANGE RIGHT (Bilateral: Arm Lower)  Patient Location: PACU  Anesthesia Type:General  Level of Consciousness: drowsy and patient cooperative  Airway & Oxygen Therapy: Patient Spontanous Breathing and Patient connected to face mask oxygen  Post-op Assessment: Report given to RN and Post -op Vital signs reviewed and stable  Post vital signs: Reviewed and stable  Last Vitals:  Vitals Value Taken Time  BP 141/70 12/01/21 2036  Temp    Pulse 115 12/01/21 2042  Resp 19 12/01/21 2042  SpO2 100 % 12/01/21 2042  Vitals shown include unvalidated device data.  Last Pain:  Vitals:   12/01/21 1844  TempSrc:   PainSc: (P) 8       Patients Stated Pain Goal: 4 (12/01/21 4239)  Complications: No notable events documented.

## 2021-12-01 NOTE — Anesthesia Preprocedure Evaluation (Signed)
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 with prolonged (>30 min) block after GI procedure)  Airway Mallampati: III  TM Distance: >3 FB Neck ROM: Full    Dental  (+) Poor Dentition, Dental Advisory Given   Pulmonary asthma , Current Smoker and Patient abstained from smoking.,    breath sounds clear to auscultation       Cardiovascular negative cardio ROS   Rhythm:Regular     Neuro/Psych negative neurological ROS     GI/Hepatic negative GI ROS, (+)     substance abuse  cocaine use, methamphetamine use and IV drug use,   Endo/Other  negative endocrine ROS  Renal/GU negative Renal ROS     Musculoskeletal negative musculoskeletal ROS (+)   Abdominal   Peds  Hematology  (+) anemia ,   Anesthesia Other Findings  Presented with sepsis and BUE cellulitis s/p I&D  Reproductive/Obstetrics                             Anesthesia Physical Anesthesia Plan  ASA: 3  Anesthesia Plan: General   Post-op Pain Management: Ketamine IV and Tylenol PO (pre-op)   Induction: Intravenous  PONV Risk Score and Plan: 1 and Ondansetron and Dexamethasone  Airway Management Planned: LMA and Oral ETT  Additional Equipment: None  Intra-op Plan:   Post-operative Plan: Extubation in OR  Informed Consent: I have reviewed the patients History and Physical, chart, labs and discussed the procedure including the risks, benefits and alternatives for the proposed anesthesia with the patient or authorized representative who has indicated his/her understanding and acceptance.     Dental advisory given  Plan Discussed  with: CRNA and Anesthesiologist  Anesthesia Plan Comments:         Anesthesia Quick Evaluation

## 2021-12-01 NOTE — Anesthesia Procedure Notes (Signed)
Procedure Name: LMA Insertion Date/Time: 12/01/2021 7:04 PM Performed by: Epimenio Sarin, CRNA Pre-anesthesia Checklist: Patient identified, Emergency Drugs available, Suction available, Patient being monitored and Timeout performed Patient Re-evaluated:Patient Re-evaluated prior to induction Oxygen Delivery Method: Circle system utilized Preoxygenation: Pre-oxygenation with 100% oxygen Induction Type: IV induction Ventilation: Mask ventilation without difficulty LMA: LMA with gastric port inserted LMA Size: 4.0 Number of attempts: 1 Dental Injury: Teeth and Oropharynx as per pre-operative assessment

## 2021-12-01 NOTE — Brief Op Note (Addendum)
12/01/2021  8:43 PM  PATIENT:  Jason Buchanan  35 y.o. male  PRE-OPERATIVE DIAGNOSIS:  INFECTION BILATERAL UPPER EXTREMIES.  POST-OPERATIVE DIAGNOSIS:  SAME  PROCEDURE:  Procedure(s): INCISION AND DRAINAGE BILATERAL UPPER EXTREMITIES WITH  WOUND VAC CHANGE RIGHT (Bilateral)  RUE: I&D deep abscess wrist, forearm, hand. Tenolysis of flexor tendons in zone V with passive manipulation of digits. Wound Vac application with white sponge over tendons, nerves, black sponge over granulation tissue, incisional vac over CTR incision.  LUE: I&D deep abscess hand, wrist and forearm, closure of volar wound. Dorsal hand penrose removed. Distally advancement flap performed to cover extensor tendons. Proximally healthy granulation tissue, no exposed tendons- left open to granulate by secondary intention. Dressing applied- change daily.   SURGEON:  Surgeon(s) and Role:    * Gomez Cleverly, MD - Primary  PHYSICIAN ASSISTANT:   ASSISTANTS: none   ANESTHESIA:   general  EBL:  100 mL   BLOOD ADMINISTERED:none  DRAINS: none   LOCAL MEDICATIONS USED:  NONE  SPECIMEN:  No Specimen  DISPOSITION OF SPECIMEN:  N/A  COUNTS:  YES  TOURNIQUET:   Total Tourniquet Time Documented: Upper Arm (Right) - 60 minutes Total: Upper Arm (Right) - 60 minutes  DICTATION: .Reubin Milan Dictation  PLAN OF CARE: Admit to inpatient   PATIENT DISPOSITION:  PACU - hemodynamically stable.   Delay start of Pharmacological VTE agent (>24hrs) due to surgical blood loss or risk of bleeding: not applicable

## 2021-12-02 ENCOUNTER — Encounter (HOSPITAL_COMMUNITY): Payer: Self-pay | Admitting: Orthopedic Surgery

## 2021-12-02 DIAGNOSIS — F15951 Other stimulant use, unspecified with stimulant-induced psychotic disorder with hallucinations: Secondary | ICD-10-CM | POA: Diagnosis not present

## 2021-12-02 LAB — CBC
HCT: 23.3 % — ABNORMAL LOW (ref 39.0–52.0)
Hemoglobin: 7.2 g/dL — ABNORMAL LOW (ref 13.0–17.0)
MCH: 27.5 pg (ref 26.0–34.0)
MCHC: 30.9 g/dL (ref 30.0–36.0)
MCV: 88.9 fL (ref 80.0–100.0)
Platelets: 462 10*3/uL — ABNORMAL HIGH (ref 150–400)
RBC: 2.62 MIL/uL — ABNORMAL LOW (ref 4.22–5.81)
RDW: 14.9 % (ref 11.5–15.5)
WBC: 11.2 10*3/uL — ABNORMAL HIGH (ref 4.0–10.5)
nRBC: 0 % (ref 0.0–0.2)

## 2021-12-02 LAB — BASIC METABOLIC PANEL
Anion gap: 5 (ref 5–15)
BUN: 21 mg/dL — ABNORMAL HIGH (ref 6–20)
CO2: 27 mmol/L (ref 22–32)
Calcium: 8.4 mg/dL — ABNORMAL LOW (ref 8.9–10.3)
Chloride: 102 mmol/L (ref 98–111)
Creatinine, Ser: 0.44 mg/dL — ABNORMAL LOW (ref 0.61–1.24)
GFR, Estimated: >60 mL/min (ref >60)
Glucose, Bld: 159 mg/dL — ABNORMAL HIGH (ref 70–99)
Potassium: 4.1 mmol/L (ref 3.5–5.1)
Sodium: 134 mmol/L — ABNORMAL LOW (ref 135–145)

## 2021-12-02 LAB — CK: Total CK: 30 U/L — ABNORMAL LOW (ref 49–397)

## 2021-12-02 MED ORDER — ONDANSETRON HCL 4 MG/2ML IJ SOLN
INTRAMUSCULAR | Status: AC
  Filled 2021-12-02: qty 2

## 2021-12-02 MED ORDER — NICOTINE 7 MG/24HR TD PT24
7.0000 mg | MEDICATED_PATCH | Freq: Every day | TRANSDERMAL | Status: DC
  Administered 2021-12-03 – 2021-12-06 (×4): 7 mg via TRANSDERMAL
  Filled 2021-12-02 (×5): qty 1

## 2021-12-02 MED ORDER — PHENYLEPHRINE 40 MCG/ML (10ML) SYRINGE FOR IV PUSH (FOR BLOOD PRESSURE SUPPORT)
PREFILLED_SYRINGE | INTRAVENOUS | Status: AC
  Filled 2021-12-02: qty 10

## 2021-12-02 NOTE — Progress Notes (Signed)
PROGRESS NOTE    Jason Buchanan   GGE:[id number]  DOB: [birth year]  DOA: 11/14/2021 PCP: Lavinia Sharps, NP   Brief Narrative:  Jason Buchanan. Jason Buchanan was admitted to the hospital with the working diagnosis of severe sepsis due to right upper extremities cellulitis, complicated with streptococcus bacteremia.    35 yo male with the past medical history of polysubstance abuse who presented with bilateral arm pain. Patient has a h/o of injecting multiple drugs in both arms.   CT scan of upper extremities showed extensive cellulitis and he was placed on broad-spectrum antibiotic therapy.   12/13 I&D of bilateral upper extremities. Blood culture positive for Streptococcus group B.   12/15 patient developed melena with acute blood loss, hemoglobin down to 5.8. He received 2 units packed red blood cell transfusion and was placed on intravenous pantoprazole. 12/15 upper endoscopy showing mild proximal mid esophagitis.  Nonbleeding gastric ulcers with no stigmata of bleeding.  Positive adherent clot.  Nonbleeding duodenal ulcers with adherent clot.  Ulcers were injected. He had postprocedure respiratory failure that required invasive mechanical ventilation.  Extubated late 12/15.   12/20 Incision and drainage and debridement of right upper extremity, incision and drainage of left upper extremity.   12/23 bilateral arms I&D, the OR under anesthesia.   12/29 I and D of b/l UE with wound vac change on right    Subjective: No new complaints.    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, strep pyogenes bacteremia, left hand wound culture infected with staph aureus - Status post multiple I&D's as mentioned above - Hand surgery would like to continue to keep him in the hospital on IV antibiotics and has plans to do further I&D's today - Continue IV and oral pain control as it is as patient feels this regimen is working well - Per ID recommendations,  continue daptomycin during hospitalization with plans to transition to Zyvox for 30 days when he is discharged  Upper GI bleed with acute blood loss secondary to gastric and duodenal ulcers - States that he was taking multiple NSAIDs prior to hospitalization to help control his arm pain - He is stabilized now and knows to avoid NSAIDs - Continue PPI twice daily  Anemia due to acute blood loss from upper extremities Transfuse to keep hemoglobin greater than 7 -He has received 4 units of packed red blood cells  Multiple electrolyte abnormalities - Continue to follow sodium, potassium magnesium and replace as needed  Severe protein calorie malnutrition - Continue supplements  Polysubstance abuse tobacco abuse, substance abuse mood disorder - Continue Seroquel and trazodone as recommended by psychiatry  Nicotine abuse - cont Nicoderm patch but reduce dose to 7 mg  Time spent in minutes: 35 DVT prophylaxis: SCDs Start: 11/14/21 1835  Code Status: Full code Family Communication:  Level of Care: Level of care: Med-Surg Disposition Plan:  Status is: Inpatient  Remains inpatient appropriate because: On IV antibiotics      Consultants:  Hand surgeon: Dr. Izora Ribas 11/14/2021 Orthopedics: Dr. Kristine Linea 11/14/2021 ID: Dr.Comer, 11/15/2021 Gastroenterology: Dr. Ewing Schlein 11/16/2021 PCCM: Dr. Katrinka Blazing 11/17/2021 Psychiatry: Dr. Jannifer Franklin 11/19/2021 Wound care RN Procedures:   Incision and drainage and debridement right upper extremity/incision and drainage of left upper extremity by Dr. Izora Ribas hand surgeon 11/23/2021. Wound evaluation left upper extremity with dressing change under sedation/incision and drainage right upper extremity and loose closure of wound, right upper extremity per Dr. Izora Ribas, hand surgeon 11/18/2021 Right hand fasciotomy/right  hand carpal tunnel release/right hand renal scan no release/right forearm 3 compartment fasciotomy/thorough irrigation of hand and forearm/dorsal hand  incision and drainage including extensor tendon sheath per Dr. Izora Ribas, hand surgeon 11/17/2021 Central line insertion per PCCM, Dr. Katrinka Blazing 11/17/2021 Upper endoscopy per GI, Dr. Ewing Schlein 11/17/2021 Left wrist deep abscess incision and drainage/left volar forearm fasciotomy/left wrist carpal tunnel release extended with Guyon's canal release--per Dr. Yehuda Budd orthopedics 11/15/2021 Antimicrobials:  Anti-infectives (From admission, onward)    Start     Dose/Rate Route Frequency Ordered Stop   11/17/21 1700  cefTRIAXone (ROCEPHIN) 2 g in sodium chloride 0.9 % 100 mL IVPB  Status:  Discontinued        2 g 200 mL/hr over 30 Minutes Intravenous Every 24 hours 11/17/21 1019 11/21/21 1047   11/17/21 1700  DAPTOmycin (CUBICIN) 500 mg in sodium chloride 0.9 % IVPB        8 mg/kg  60 kg 120 mL/hr over 30 Minutes Intravenous Daily 11/17/21 1555     11/17/21 1200  metroNIDAZOLE (FLAGYL) IVPB 500 mg  Status:  Discontinued        500 mg 100 mL/hr over 60 Minutes Intravenous Every 12 hours 11/17/21 1019 11/18/21 0924   11/15/21 1000  cefTRIAXone (ROCEPHIN) 2 g in sodium chloride 0.9 % 100 mL IVPB  Status:  Discontinued        2 g 200 mL/hr over 30 Minutes Intravenous Every 24 hours 11/14/21 1834 11/15/21 0405   11/15/21 1000  clindamycin (CLEOCIN) IVPB 600 mg  Status:  Discontinued        600 mg 100 mL/hr over 30 Minutes Intravenous Every 8 hours 11/15/21 0405 11/15/21 1523   11/15/21 0800  penicillin G potassium 12 Million Units in dextrose 5 % 500 mL continuous infusion  Status:  Discontinued        12 Million Units 41.7 mL/hr over 12 Hours Intravenous Every 12 hours 11/15/21 0405 11/17/21 1956   11/14/21 2200  vancomycin (VANCOREADY) IVPB 750 mg/150 mL  Status:  Discontinued        750 mg 150 mL/hr over 60 Minutes Intravenous Every 12 hours 11/14/21 1524 11/15/21 0405   11/14/21 0900  vancomycin (VANCOCIN) IVPB 1000 mg/200 mL premix        1,000 mg 200 mL/hr over 60 Minutes Intravenous  Once 11/14/21  0849 11/14/21 1043   11/14/21 0900  cefTRIAXone (ROCEPHIN) 2 g in sodium chloride 0.9 % 100 mL IVPB        2 g 200 mL/hr over 30 Minutes Intravenous  Once 11/14/21 0849 11/14/21 0935        Objective: Vitals:   12/02/21 0722 12/02/21 1348 12/02/21 1500 12/02/21 1551  BP: (!) 141/122   (!) 146/78  Pulse:  (!) 112 (!) 120 (!) 114  Resp: 20 18  18   Temp: 97.8 F (36.6 C)   (!) 97.5 F (36.4 C)  TempSrc: Oral   Oral  SpO2: 100% 100%  99%  Weight:      Height:        Intake/Output Summary (Last 24 hours) at 12/02/2021 1556 Last data filed at 12/02/2021 1230 Gross per 24 hour  Intake 1430 ml  Output 325 ml  Net 1105 ml    Filed Weights   11/14/21 0835 11/15/21 1608 11/25/21 1342  Weight: 60 kg 60 kg 60 kg    Examination: General exam: Appears comfortable  HEENT: PERRLA, oral mucosa moist, no sclera icterus or thrush Respiratory system: Clear to auscultation. Respiratory effort  normal. Cardiovascular system: S1 & S2 heard, regular rate and rhythm Gastrointestinal system: Abdomen soft, non-tender, nondistended. Normal bowel sounds   Central nervous system: Alert and oriented. No focal neurological deficits. Extremities: No cyanosis, clubbing - dressing on arms not opended Skin: No rashes or ulcers Psychiatry:  Mood & affect appropriate.      Data Reviewed: I have personally reviewed following labs and imaging studies  CBC: Recent Labs  Lab 11/27/21 0316 11/29/21 0445 12/02/21 0442  WBC 10.1 8.3 11.2*  NEUTROABS  --  4.9  --   HGB 7.8* 7.7* 7.2*  HCT 25.1* 25.1* 23.3*  MCV 91.3 90.6 88.9  PLT 885* 686* 462*    Basic Metabolic Panel: Recent Labs  Lab 11/27/21 0316 11/29/21 0445 11/30/21 0302 12/02/21 0442  NA 135 134*  --  134*  K 4.1 3.4*  --  4.1  CL 102 101  --  102  CO2 28 27  --  27  GLUCOSE 115* 123*  --  159*  BUN 25* 23*  --  21*  CREATININE 0.42* 0.42*  --  0.44*  CALCIUM 8.4* 8.1*  --  8.4*  MG  --  1.7 1.9  --     GFR: Estimated  Creatinine Clearance: 109.4 mL/min (A) (by C-G formula based on SCr of 0.44 mg/dL (L)). Liver Function Tests: No results for input(s): AST, ALT, ALKPHOS, BILITOT, PROT, ALBUMIN in the last 168 hours. No results for input(s): LIPASE, AMYLASE in the last 168 hours. No results for input(s): AMMONIA in the last 168 hours. Coagulation Profile: No results for input(s): INR, PROTIME in the last 168 hours. Cardiac Enzymes: Recent Labs  Lab 12/02/21 0442  CKTOTAL 30*    BNP (last 3 results) No results for input(s): PROBNP in the last 8760 hours. HbA1C: No results for input(s): HGBA1C in the last 72 hours. CBG: No results for input(s): GLUCAP in the last 168 hours. Lipid Profile: No results for input(s): CHOL, HDL, LDLCALC, TRIG, CHOLHDL, LDLDIRECT in the last 72 hours. Thyroid Function Tests: No results for input(s): TSH, T4TOTAL, FREET4, T3FREE, THYROIDAB in the last 72 hours. Anemia Panel: No results for input(s): VITAMINB12, FOLATE, FERRITIN, TIBC, IRON, RETICCTPCT in the last 72 hours. Urine analysis:    Component Value Date/Time   COLORURINE STRAW (A) 11/14/2021 1107   APPEARANCEUR CLEAR 11/14/2021 1107   LABSPEC 1.018 11/14/2021 1107   PHURINE 7.0 11/14/2021 1107   GLUCOSEU NEGATIVE 11/14/2021 1107   HGBUR MODERATE (A) 11/14/2021 1107   BILIRUBINUR NEGATIVE 11/14/2021 1107   KETONESUR 5 (A) 11/14/2021 1107   PROTEINUR NEGATIVE 11/14/2021 1107   UROBILINOGEN 0.2 03/08/2011 1230   NITRITE NEGATIVE 11/14/2021 1107   LEUKOCYTESUR NEGATIVE 11/14/2021 1107   Sepsis Labs: (procalcitonin:4,lacticidven:4) )No results found for this or any previous visit (from the past 240 hour(s)).       Radiology Studies: No results found.    Scheduled Meds:  (feeding supplement) PROSource Plus  30 mL Oral TID BM   acetaminophen  650 mg Oral Q6H   vitamin C  500 mg Oral Daily   Chlorhexidine Gluconate Cloth  6 each Topical Daily   feeding supplement  237 mL Oral BID BM    ferrous sulfate  325 mg Oral Q breakfast   nicotine  14 mg Transdermal Daily   pantoprazole  40 mg Oral BID   QUEtiapine  100 mg Oral BID   Continuous Infusions:  DAPTOmycin (CUBICIN)  IV 500 mg (12/01/21 2227)     LOS:  18 days      Calvert Cantor, MD [facility] Pager: [email or web address] 12/02/2021, 3:56 PM

## 2021-12-02 NOTE — Progress Notes (Signed)
Pharmacy Antibiotic Note  Jason Buchanan is a 35 y.o. male admitted on 11/14/2021 with bilateral upper extremity wounds now s/p fasciotomy and I&D.  Pharmacy has been consulted for daptomycin dosing.  Today, 12/02/21  - Day #16 Dapto - SCr stable at 0.44 - Ck 65 (12/16), 18 (12/23), 30 (12/30)  Plan: Continue Daptomycin 8 mg/kg daily as inpatient ID plans for antibiotics upon discharge rec 30 days Zyvox 600mg  bid Will f/u renal function weekly CK (next 1/6)  Height: 5\' 7"  (170.2 cm) Weight: 60 kg (132 lb 4.4 oz) IBW/kg (Calculated) : 66.1  Temp (24hrs), Avg:97.6 F (36.4 C), Min:97.4 F (36.3 C), Max:97.8 F (36.6 C)  Recent Labs  Lab 11/27/21 0316 11/29/21 0445 12/02/21 0442  WBC 10.1 8.3 11.2*  CREATININE 0.42* 0.42* 0.44*     Estimated Creatinine Clearance: 109.4 mL/min (A) (by C-G formula based on SCr of 0.44 mg/dL (L)).    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 Had prolonged block (>30 min) after succinylcholine for GI procedure.    Antimicrobials this admission:  12/12 rocephin/vanc >> 12/12 12/13 clinda >> 12/13 12/13 PCN G >> 12/15 12/15 CTX>12/19 12/15 Flagyl>>12/16 12/15 dapto >>  Dose adjustments this admission: none  Microbiology results:  12/12 BCx: 1/4 strep pyogenes; R EES, sens to others  12/13 left hand dorsal: abund streptococcus pyogenes, rare MRSA  12/13 R wrist: mod strep pyoges, rare MRSA 12/14 repeat BCx: NGF 12/15 MRSA PCR: detected    1/15, PharmD, BCPS 12/02/2021 11:46 AM

## 2021-12-02 NOTE — TOC Progression Note (Signed)
Transition of Care [facility]) - Progression Note    Patient Details  Name: Jason Buchanan MRN: [id number] Date of Birth: [birth year]  [facility] of [facility]) CM/SW Contact  Golda Acre, RN Phone Number: 12/02/2021, 12:10 PM  Clinical Narrative:    [place] [facility] sent out to surrounding areas and [place]   Expected Discharge Plan: Skilled Nursing Facility Barriers to Discharge: Continued Medical Work up  Expected Discharge Plan and Services Expected Discharge Plan: Skilled Nursing Facility   Discharge Planning Services: CM Consult   Living arrangements for the past 2 months: Homeless Shelter Expected Discharge Date: 11/14/21                                     Social Determinants of Health (SDOH) Interventions    Readmission Risk Interventions No flowsheet data found.

## 2021-12-02 NOTE — NC FL2 (Signed)
[place] LEVEL OF CARE SCREENING TOOL     IDENTIFICATION  Patient Name: Jason Buchanan Birthdate: 02/21/1986 Sex: male Admission Date (Current Location): 11/14/2021  [facility] and [place] Number:  Herbalist and Address:  [facility],  [place] [address], [place]      Provider Number: [id number]  Attending Physician Name and Address:  Debbe Odea, MD  Relative Name and Phone Number:       Current Level of Care: Hospital Recommended Level of Care: [place] Prior Approval Number:    Date Approved/Denied:   PASRR Number: [id number]  Discharge Plan: SNF    Current Diagnoses: Patient Active Problem List   Diagnosis Date Noted   Protein-calorie malnutrition, severe 11/25/2021   Hepatitis C 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    Acute blood loss anemia 11/19/2021   Upper GI bleed 11/19/2021   Respiratory failure, post-operative ([place]) 11/19/2021   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/2021   Staphylococcus aureus infection 11/18/2021   Streptococcal bacteremia 11/16/2021   Right arm cellulitis    Sepsis ([place]) 11/14/2021   Abscess of dorsum of right hand 09/27/2021   Cellulitis and abscess of hand 09/27/2021   IVDU (intravenous drug user)    Sepsis due to cellulitis ([place]) 11/07/2017   Polysubstance abuse ([place]) 11/07/2017   Cocaine abuse ([place]) 11/07/2017   Hypokalemia 11/07/2017    Orientation RESPIRATION BLADDER Height & Weight     Self, Time, Situation, Place  Normal Continent Weight: 60 kg Height:  5\' 7"  (170.2 cm)  BEHAVIORAL SYMPTOMS/MOOD NEUROLOGICAL BOWEL NUTRITION STATUS      Continent Diet (regular)  AMBULATORY STATUS COMMUNICATION OF NEEDS Skin   Extensive Assist Verbally Surgical wounds (to lower and mid arms)                       Personal Care Assistance Level of Assistance  Bathing, Feeding, Dressing Bathing Assistance: Limited assistance Feeding  assistance: Limited assistance Dressing Assistance: Limited assistance     Functional Limitations Info  Sight, Hearing, Speech Sight Info: Adequate Hearing Info: Adequate Speech Info: Adequate    SPECIAL CARE FACTORS FREQUENCY  PT (By licensed PT), OT (By licensed OT)     PT Frequency: 5 x weekly OT Frequency: 5 x weekly            Contractures Contractures Info: Not present    Additional Factors Info  Code Status Code Status Info: full Allergies Info: bee venom           Current Medications (12/02/2021):  This is the current hospital active medication list Current Facility-Administered Medications  Medication Dose Route Frequency Provider Last Rate Last Admin   (feeding supplement) PROSource Plus liquid 30 mL  30 mL Oral TID BM Arrien, Jimmy Picket, MD   30 mL at 12/02/21 1040   acetaminophen (TYLENOL) tablet 650 mg  650 mg Oral Q6H Arrien, Jimmy Picket, MD   650 mg at 12/02/21 [id number]   ascorbic acid (VITAMIN C) tablet 500 mg  500 mg Oral Daily Arrien, Jimmy Picket, MD   500 mg at 12/02/21 1040   Chlorhexidine Gluconate Cloth 2 % PADS 6 each  6 each Topical Daily Candee Furbish, MD   6 each at 12/02/21 1043   cyclobenzaprine (FLEXERIL) tablet 5 mg  5 mg Oral TID PRN Tawni Millers, MD   5 mg at 11/30/21 1550   DAPTOmycin (CUBICIN) 500 mg in sodium chloride 0.9 %  IVPB  8 mg/kg Intravenous Q2000 Veryl Speak, FNP 120 mL/hr at 12/01/21 2227 500 mg at 12/01/21 2227   feeding supplement (ENSURE ENLIVE / ENSURE PLUS) liquid 237 mL  237 mL Oral BID BM Ollis, Brandi L, NP   237 mL at 12/02/21 1043   ferrous sulfate tablet 325 mg  325 mg Oral Q breakfast Arrien, York Ram, MD   325 mg at 12/02/21 1040   HYDROmorphone (DILAUDID) injection 2 mg  2 mg Intravenous Q2H PRN Arrien, York Ram, MD   2 mg at 12/02/21 1037   nicotine (NICODERM CQ - dosed in mg/24 hours) patch 14 mg  14 mg Transdermal Daily Arrien, York Ram, MD   14 mg at 12/02/21  1043   ondansetron (ZOFRAN) tablet 4 mg  4 mg Oral Q6H PRN Vida Rigger, MD       Or   ondansetron [facility]) injection 4 mg  4 mg Intravenous Q6H PRN Vida Rigger, MD   4 mg at 11/17/21 1779   oxyCODONE (Oxy IR/ROXICODONE) immediate release tablet 10 mg  10 mg Oral Q4H PRN Arrien, York Ram, MD   10 mg at 12/01/21 2230   pantoprazole (PROTONIX) EC tablet 40 mg  40 mg Oral BID Arrien, York Ram, MD   40 mg at 12/02/21 1041   QUEtiapine (SEROQUEL) tablet 100 mg  100 mg Oral BID Olalere, Adewale A, MD   100 mg at 12/02/21 1041   sodium chloride flush (NS) 0.9 % injection 10-40 mL  10-40 mL Intracatheter PRN Arrien, York Ram, MD   10 mL at 11/26/21 0720   traZODone (DESYREL) tablet 100 mg  100 mg Oral QHS PRN Virl Diamond A, MD   100 mg at 11/30/21 2111     Discharge Medications: Please see discharge summary for a list of discharge medications.  Relevant Imaging Results:  Relevant Lab Results:   Additional Information [id number]  Golda Acre, RN

## 2021-12-03 DIAGNOSIS — F15951 Other stimulant use, unspecified with stimulant-induced psychotic disorder with hallucinations: Secondary | ICD-10-CM | POA: Diagnosis not present

## 2021-12-03 NOTE — Op Note (Signed)
OPERATIVE NOTE  DATE OF PROCEDURE: 12/01/2021  SURGEONS:  Primary: Orene Desanctis, MD  PREOPERATIVE DIAGNOSIS: INFECFTION BILATERAL UPPER EXTREMIES.  POSTOPERATIVE DIAGNOSIS: Same  NAME OF PROCEDURE:   Left upper extremity I&D of deep wrist abscess, hand abscess, forearm abscess Left upper extremity dorsal hand advancement flap of skin and subcutaneous tissue for coverage of exposed extensor tendons. Size of defect 6x2cm. Right upper extremity I&D of deep wrist abscess, hand abscess, forearm abscess Right forearm zone V flexor tenolysis of flexor digitorum profundus x4, flexor superficialis x4, flexor pollicis longus and passive manipulation digits for ROM Right upper extremity wound vac application over forearm and incisional vac over carpal tunnel incision  ANESTHESIA: General  SKIN PREPARATION: Hibiclens  ESTIMATED BLOOD LOSS: 100cc  IMPLANTS: wound vac RUE white sponge and black sponge  INDICATIONS:  Perseus is a 35 y.o. male who has the above preoperative diagnosis. The patient has decided to proceed with surgical intervention.  Risks, benefits and alternatives of operative management were discussed including, but not limited to, risks of anesthesia complications, infection, pain, persistent symptoms, stiffness, need for future surgery.  The patient understands, agrees and elects to proceed with surgery.    DESCRIPTION OF PROCEDURE: The patient was met in the pre-operative area and their identity was verified.  The operative location and laterality was also verified and marked.  The patient was brought to the OR and was placed supine on the table.  After repeat patient identification with the operative team anesthesia was provided and the patient was prepped and draped in the usual sterile fashion.  A final timeout was performed verifying the correction patient, procedure, location and laterality.    The bilateral upper extremities were elevated and tourniquet inflated to 250 mmHg.   The right upper extremity wound VAC has been removed and there was robust healthy beefy red granulation tissue present.  A thorough irrigation of any remaining infection and purulent material was performed with an excisional debridement of skin, subcutaneous tissue and drainage of deep abscess of the forearm, wrist, hand.  Thorough irrigation with 3 L of normal saline was performed via low flow cystoscopy tubing.  There was significant adhesions to the finger flexor tendons.  All 9 flexor tendons including 4 flexor digitorum profundus, 4 flexor digitorum superficialis and the flexor pollicis longus zone 5 tenolysis was performed as well as passive manipulation of the digits with much improved tendon excursion after the tenolysis.  Wound appeared clean with no residual signs of infection.  A white sponge was utilized to cover the sensitive structures such as the distal tendon structures and median nerve and a black sponge was used to cover the remaining aspect of the right forearm wound.  An incisional VAC was extended distally over the carpal tunnel release incision.  This was then sealed with plastic drapes and hooked to suction with no leak.  Attention was then turned to the left upper extremity where the previously placed Penrose drain was removed.  There was purulent drainage approximately within this wound.  Distally there is exposed tendon with beefy red granulation tissue present surrounding.  Excisional [place] of skin was cut subcutaneous tissue down to muscle and fascia was performed with I&D of deep abscess of the hand, wrist, forearm.  Due to exposed tendons dorsally soft tissue advancement flap was designed by making a transverse incision at the wrist crease with distal and ulnar advancement of the dorsal skin and subcutaneous tissues in order to cover the exposed extensor tendons.  This was  closed with minimal tension with mattress nylon suture.  Proximally there was beefy red granulation tissue over  the exposed muscle bellies.  This was left open and will be allowed to granulate.  3 L of normal saline via low flow cystoscopy tubing was utilized for the irrigation portion of this procedure.  There is no residual infection at the end of the irrigation and debridement.  The volar forearm incision down to the level of the carpal tunnel was thoroughly irrigated as well with no residual signs of infection.  This was closed with combination of staples and nylon sutures over the carpal tunnel.  Sterile soft bandages were applied.  Tourniquets were deflated and the fingers were pink and warm well perfused at the end of the procedure.  The patient tolerated the procedure well and was awoken from anesthesia brought to PACU for recovery in stable condition.   Matt Holmes, MD

## 2021-12-03 NOTE — Progress Notes (Signed)
PROGRESS NOTE    Jason Buchanan   WJX:[id number]  DOB: 07-Jun-1986  DOA: 11/14/2021 PCP: Lavinia Sharps, NP   Brief Narrative:  Jason Buchanan. Liebman was admitted to the hospital with the working diagnosis of severe sepsis due to right upper extremities cellulitis, complicated with streptococcus bacteremia.    35 yo male with the past medical history of polysubstance abuse who presented with bilateral arm pain. Patient has a h/o of injecting multiple drugs in both arms.   CT scan of upper extremities showed extensive cellulitis and he was placed on broad-spectrum antibiotic therapy.   12/13 I&D of bilateral upper extremities. Blood culture positive for Streptococcus group B.   12/15 patient developed melena with acute blood loss, hemoglobin down to 5.8. He received 2 units packed red blood cell transfusion and was placed on intravenous pantoprazole. 12/15 upper endoscopy showing mild proximal mid esophagitis.  Nonbleeding gastric ulcers with no stigmata of bleeding.  Positive adherent clot.  Nonbleeding duodenal ulcers with adherent clot.  Ulcers were injected. He had postprocedure respiratory failure that required invasive mechanical ventilation.  Extubated late 12/15.   12/20 Incision and drainage and debridement of right upper extremity, incision and drainage of left upper extremity.   12/23 bilateral arms I&D, the OR under anesthesia.   12/29 I and D of b/l UE with wound vac change on right    Subjective: He is having pain but has not received any Oxycodone today. States he didn't know he couldn't ask for it. He states he was told by surgery that he needs at least one more surgery next week.    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, strep pyogenes bacteremia, left hand wound culture infected with staph aureus - Status post multiple I&D's as mentioned above - Hand surgery would like to continue to keep him in the hospital on IV  antibiotics and has plans to do further I&D's today - Continue IV and oral pain control as it is as patient feels this regimen is working well - Per ID recommendations, continue daptomycin during hospitalization with plans to transition to Zyvox for 30 days when he is discharged  Upper GI bleed with acute blood loss secondary to gastric and duodenal ulcers - States that he was taking multiple NSAIDs prior to hospitalization to help control his arm pain - He is stabilized now and knows to avoid NSAIDs - Continue PPI twice daily  Anemia due to acute blood loss from upper extremities Transfuse to keep hemoglobin greater than 7 -He has received 4 units of packed red blood cells  Multiple electrolyte abnormalities - Continue to follow sodium, potassium magnesium and replace as needed  Severe protein calorie malnutrition - Continue supplements  Polysubstance abuse tobacco abuse, substance abuse mood disorder - Continue Seroquel and trazodone as recommended by psychiatry  Nicotine abuse - cont Nicoderm patch but reduce dose to 7 mg  Time spent in minutes: 35 DVT prophylaxis: SCDs Start: 11/14/21 1835  Code Status: Full code Family Communication:  Level of Care: Level of care: Med-Surg Disposition Plan:  Status is: Inpatient  Remains inpatient appropriate because: On IV antibiotics      Consultants:  Hand surgeon: Dr. Izora Ribas 11/14/2021 Orthopedics: Dr. Kristine Linea 11/14/2021 ID: Dr.Comer, 11/15/2021 Gastroenterology: Dr. Ewing Schlein 11/16/2021 PCCM: Dr. Katrinka Blazing 11/17/2021 Psychiatry: Dr. Jannifer Franklin 11/19/2021 Wound care RN Procedures:   Incision and drainage and debridement right upper extremity/incision and drainage of left upper extremity by Dr. Izora Ribas hand  surgeon 11/23/2021. Wound evaluation left upper extremity with dressing change under sedation/incision and drainage right upper extremity and loose closure of wound, right upper extremity per Dr. Izora Ribas, hand surgeon 11/18/2021 Right  hand fasciotomy/right hand carpal tunnel release/right hand renal scan no release/right forearm 3 compartment fasciotomy/thorough irrigation of hand and forearm/dorsal hand incision and drainage including extensor tendon sheath per Dr. Izora Ribas, hand surgeon 11/17/2021 Central line insertion per PCCM, Dr. Katrinka Blazing 11/17/2021 Upper endoscopy per GI, Dr. Ewing Schlein 11/17/2021 Left wrist deep abscess incision and drainage/left volar forearm fasciotomy/left wrist carpal tunnel release extended with Guyon's canal release--per Dr. Yehuda Budd orthopedics 11/15/2021 Antimicrobials:  Anti-infectives (From admission, onward)    Start     Dose/Rate Route Frequency Ordered Stop   11/17/21 1700  cefTRIAXone (ROCEPHIN) 2 g in sodium chloride 0.9 % 100 mL IVPB  Status:  Discontinued        2 g 200 mL/hr over 30 Minutes Intravenous Every 24 hours 11/17/21 1019 11/21/21 1047   11/17/21 1700  DAPTOmycin (CUBICIN) 500 mg in sodium chloride 0.9 % IVPB        8 mg/kg  60 kg 120 mL/hr over 30 Minutes Intravenous Daily 11/17/21 1555     11/17/21 1200  metroNIDAZOLE (FLAGYL) IVPB 500 mg  Status:  Discontinued        500 mg 100 mL/hr over 60 Minutes Intravenous Every 12 hours 11/17/21 1019 11/18/21 0924   11/15/21 1000  cefTRIAXone (ROCEPHIN) 2 g in sodium chloride 0.9 % 100 mL IVPB  Status:  Discontinued        2 g 200 mL/hr over 30 Minutes Intravenous Every 24 hours 11/14/21 1834 11/15/21 0405   11/15/21 1000  clindamycin (CLEOCIN) IVPB 600 mg  Status:  Discontinued        600 mg 100 mL/hr over 30 Minutes Intravenous Every 8 hours 11/15/21 0405 11/15/21 1523   11/15/21 0800  penicillin G potassium 12 Million Units in dextrose 5 % 500 mL continuous infusion  Status:  Discontinued        12 Million Units 41.7 mL/hr over 12 Hours Intravenous Every 12 hours 11/15/21 0405 11/17/21 1956   11/14/21 2200  vancomycin (VANCOREADY) IVPB 750 mg/150 mL  Status:  Discontinued        750 mg 150 mL/hr over 60 Minutes Intravenous Every 12  hours 11/14/21 1524 11/15/21 0405   11/14/21 0900  vancomycin (VANCOCIN) IVPB 1000 mg/200 mL premix        1,000 mg 200 mL/hr over 60 Minutes Intravenous  Once 11/14/21 0849 11/14/21 1043   11/14/21 0900  cefTRIAXone (ROCEPHIN) 2 g in sodium chloride 0.9 % 100 mL IVPB        2 g 200 mL/hr over 30 Minutes Intravenous  Once 11/14/21 0849 11/14/21 0935        Objective: Vitals:   12/02/21 1747 12/02/21 2107 12/03/21 0127 12/03/21 0543  BP: (!) 158/81 (!) 152/65 (!) 148/69 (!) 150/81  Pulse: (!) 116 (!) 107 (!) 106 (!) 102  Resp: 18 20 20 20   Temp: 97.8 F (36.6 C) 98.6 F (37 C) 97.7 F (36.5 C) 97.9 F (36.6 C)  TempSrc: Oral Oral Oral Oral  SpO2: 100% 100% 100% 100%  Weight:      Height:        Intake/Output Summary (Last 24 hours) at 12/03/2021 1156 Last data filed at 12/03/2021 0000 Gross per 24 hour  Intake 240 ml  Output 700 ml  Net -460 ml    12/05/2021  11/14/21 0835 11/15/21 1608 11/25/21 1342  Weight: 60 kg 60 kg 60 kg    Examination: General exam: Appears comfortable  HEENT: PERRLA, oral mucosa moist, no sclera icterus or thrush Respiratory system: Clear to auscultation. Respiratory effort normal. Cardiovascular system: S1 & S2 heard, regular rate and rhythm Gastrointestinal system: Abdomen soft, non-tender, nondistended. Normal bowel sounds   Central nervous system: Alert and oriented. No focal neurological deficits. Extremities: No cyanosis, clubbing or edema- arm dressings not opened Skin: No rashes or ulcers Psychiatry:  Mood & affect appropriate.      Data Reviewed: I have personally reviewed following labs and imaging studies  CBC: Recent Labs  Lab 11/27/21 0316 11/29/21 0445 12/02/21 0442  WBC 10.1 8.3 11.2*  NEUTROABS  --  4.9  --   HGB 7.8* 7.7* 7.2*  HCT 25.1* 25.1* 23.3*  MCV 91.3 90.6 88.9  PLT 885* 686* 462*    Basic Metabolic Panel: Recent Labs  Lab 11/27/21 0316 11/29/21 0445 11/30/21 0302 12/02/21 0442  NA 135  134*  --  134*  K 4.1 3.4*  --  4.1  CL 102 101  --  102  CO2 28 27  --  27  GLUCOSE 115* 123*  --  159*  BUN 25* 23*  --  21*  CREATININE 0.42* 0.42*  --  0.44*  CALCIUM 8.4* 8.1*  --  8.4*  MG  --  1.7 1.9  --     GFR: Estimated Creatinine Clearance: 109.4 mL/min (A) (by C-G formula based on SCr of 0.44 mg/dL (L)). Liver Function Tests: No results for input(s): AST, ALT, ALKPHOS, BILITOT, PROT, ALBUMIN in the last 168 hours. No results for input(s): LIPASE, AMYLASE in the last 168 hours. No results for input(s): AMMONIA in the last 168 hours. Coagulation Profile: No results for input(s): INR, PROTIME in the last 168 hours. Cardiac Enzymes: Recent Labs  Lab 12/02/21 0442  CKTOTAL 30*    BNP (last 3 results) No results for input(s): PROBNP in the last 8760 hours. HbA1C: No results for input(s): HGBA1C in the last 72 hours. CBG: No results for input(s): GLUCAP in the last 168 hours. Lipid Profile: No results for input(s): CHOL, HDL, LDLCALC, TRIG, CHOLHDL, LDLDIRECT in the last 72 hours. Thyroid Function Tests: No results for input(s): TSH, T4TOTAL, FREET4, T3FREE, THYROIDAB in the last 72 hours. Anemia Panel: No results for input(s): VITAMINB12, FOLATE, FERRITIN, TIBC, IRON, RETICCTPCT in the last 72 hours. Urine analysis:    Component Value Date/Time   COLORURINE STRAW (A) 11/14/2021 1107   APPEARANCEUR CLEAR 11/14/2021 1107   LABSPEC 1.018 11/14/2021 1107   PHURINE 7.0 11/14/2021 1107   GLUCOSEU NEGATIVE 11/14/2021 1107   HGBUR MODERATE (A) 11/14/2021 1107   BILIRUBINUR NEGATIVE 11/14/2021 1107   KETONESUR 5 (A) 11/14/2021 1107   PROTEINUR NEGATIVE 11/14/2021 1107   UROBILINOGEN 0.2 03/08/2011 1230   NITRITE NEGATIVE 11/14/2021 1107   LEUKOCYTESUR NEGATIVE 11/14/2021 1107   Sepsis Labs: @LABRCNTIP (procalcitonin:4,lacticidven:4) )No results found for this or any previous visit (from the past 240 hour(s)).       Radiology Studies: No results  found.    Scheduled Meds:  (feeding supplement) PROSource Plus  30 mL Oral TID BM   acetaminophen  650 mg Oral Q6H   vitamin C  500 mg Oral Daily   Chlorhexidine Gluconate Cloth  6 each Topical Daily   feeding supplement  237 mL Oral BID BM   ferrous sulfate  325 mg Oral Q breakfast  nicotine  7 mg Transdermal Daily   pantoprazole  40 mg Oral BID   QUEtiapine  100 mg Oral BID   Continuous Infusions:  DAPTOmycin (CUBICIN)  IV 500 mg (12/02/21 2118)     LOS: 19 days      Calvert Cantor, MD [facility] Pager: [email or web address] 12/03/2021, 11:56 AM

## 2021-12-03 NOTE — Progress Notes (Signed)
The patient is injury-free, afebrile, alert, and oriented X 4. Vital signs were within the baseline during this shift. He continuously complains of bilateral arm pain. He was asking for 2 mg of hydromorphone every two hours. Pt denies chest pain, SOB, nausea, vomiting, dizziness, signs or symptoms of bleeding, or acute changes during this shift. We will continue to monitor and work toward achieving the care plan goals.

## 2021-12-03 NOTE — Plan of Care (Signed)
°  Problem: Education: Goal: Knowledge of General Education information will improve Description: Including pain rating scale, medication(s)/side effects and non-pharmacologic comfort measures Outcome: Progressing   Problem: Health Behavior/Discharge Planning: Goal: Ability to manage health-related needs will improve Outcome: Progressing   Problem: Clinical Measurements: Goal: Ability to maintain clinical measurements within normal limits will improve Outcome: Progressing Goal: Will remain free from infection Outcome: Progressing Goal: Diagnostic test results will improve Outcome: Progressing Goal: Respiratory complications will improve Outcome: Progressing Goal: Cardiovascular complication will be avoided Outcome: Progressing   Problem: Activity: Goal: Risk for activity intolerance will decrease Outcome: Not Progressing   Problem: Nutrition: Goal: Adequate nutrition will be maintained Outcome: Progressing   Problem: Coping: Goal: Level of anxiety will decrease Outcome: Progressing   Problem: Elimination: Goal: Will not experience complications related to bowel motility Outcome: Progressing Goal: Will not experience complications related to urinary retention Outcome: Progressing   Problem: Pain Managment: Goal: General experience of comfort will improve Outcome: Not Progressing   Problem: Safety: Goal: Ability to remain free from injury will improve Outcome: Progressing   Problem: Skin Integrity: Goal: Risk for impaired skin integrity will decrease Outcome: Progressing   Problem: Fluid Volume: Goal: Hemodynamic stability will improve Outcome: Progressing   Problem: Clinical Measurements: Goal: Diagnostic test results will improve Outcome: Progressing Goal: Signs and symptoms of infection will decrease Outcome: Progressing   Problem: Respiratory: Goal: Ability to maintain adequate ventilation will improve Outcome: Progressing

## 2021-12-04 ENCOUNTER — Inpatient Hospital Stay (HOSPITAL_COMMUNITY): Payer: 59

## 2021-12-04 ENCOUNTER — Encounter (HOSPITAL_COMMUNITY): Payer: Self-pay | Admitting: Internal Medicine

## 2021-12-04 DIAGNOSIS — F15951 Other stimulant use, unspecified with stimulant-induced psychotic disorder with hallucinations: Secondary | ICD-10-CM | POA: Diagnosis not present

## 2021-12-04 DIAGNOSIS — E878 Other disorders of electrolyte and fluid balance, not elsewhere classified: Secondary | ICD-10-CM

## 2021-12-04 DIAGNOSIS — L089 Local infection of the skin and subcutaneous tissue, unspecified: Secondary | ICD-10-CM

## 2021-12-04 LAB — TYPE AND SCREEN
ABO/RH(D): A POS
Antibody Screen: NEGATIVE

## 2021-12-04 LAB — HEMOGLOBIN AND HEMATOCRIT, BLOOD
HCT: 23.2 % — ABNORMAL LOW (ref 39.0–52.0)
Hemoglobin: 7.1 g/dL — ABNORMAL LOW (ref 13.0–17.0)

## 2021-12-04 MED ORDER — POLYETHYLENE GLYCOL 3350 17 G PO PACK
17.0000 g | PACK | Freq: Two times a day (BID) | ORAL | Status: DC
  Administered 2021-12-04 – 2022-01-17 (×43): 17 g via ORAL
  Filled 2021-12-04 (×66): qty 1

## 2021-12-04 MED ORDER — OXYCODONE HCL 5 MG PO TABS
15.0000 mg | ORAL_TABLET | ORAL | Status: DC | PRN
  Administered 2021-12-04 – 2021-12-16 (×48): 15 mg via ORAL
  Filled 2021-12-04 (×48): qty 3

## 2021-12-04 MED ORDER — ACETAMINOPHEN 500 MG PO TABS
1000.0000 mg | ORAL_TABLET | Freq: Three times a day (TID) | ORAL | Status: AC
  Administered 2021-12-04 – 2021-12-06 (×7): 1000 mg via ORAL
  Filled 2021-12-04 (×8): qty 2

## 2021-12-04 MED ORDER — SODIUM CHLORIDE (PF) 0.9 % IJ SOLN
INTRAMUSCULAR | Status: AC
  Filled 2021-12-04: qty 50

## 2021-12-04 MED ORDER — SENNOSIDES-DOCUSATE SODIUM 8.6-50 MG PO TABS
2.0000 | ORAL_TABLET | Freq: Every day | ORAL | Status: AC
  Administered 2021-12-04 – 2021-12-08 (×5): 2 via ORAL
  Filled 2021-12-04 (×5): qty 2

## 2021-12-04 MED ORDER — IOHEXOL 350 MG/ML SOLN
80.0000 mL | Freq: Once | INTRAVENOUS | Status: AC | PRN
  Administered 2021-12-04: 80 mL via INTRAVENOUS

## 2021-12-04 MED ORDER — ACETAMINOPHEN 325 MG PO TABS
650.0000 mg | ORAL_TABLET | Freq: Four times a day (QID) | ORAL | Status: DC | PRN
  Administered 2021-12-08 – 2021-12-10 (×2): 650 mg via ORAL
  Filled 2021-12-04 (×2): qty 2

## 2021-12-04 NOTE — Assessment & Plan Note (Addendum)
Initial CT imaging (11/14/22) consistent with right arm cellulitis. Patient underwent 4 I&D surgical procedures between orthopedic surgery and plastic surgery. Repeat CT imaging (12/04/21) concerning for abscess/pyomyositis of the right arm wound. Subsequently, patient has undergone 5 more surgical I&D procedures with application of a wound vac, in addition to graft placement (1/19). Patient has been managed on high-dose narcotic regimen of oxycodone 15 mg PO q3 hours PRN  -Pt had been continued Dilaudid 2 mg IV q3 hours PRN that has now been weaned off as of 2/10 - Plastic surgery reevaluated patient's wounds on 2/5 secondary to concern for infection, however no evidence of active infection identified (likely pseudomonas colonization); no antibiotics restarted. -Continued Oxycodone PRN -Continued robaxin PRN -Pt continued to complain of R forearm pain. Ordered and reviewed R forearm xray. Findings concerning for metallic foreign body in soft tissue under skin graft site.  -Recently discussed with Plastic Surgery. Appreciate input. Given small size of object, surgery is unlikelly able to explore forearm without damaging surrounding tissue, thus no indication for surgery -Recently added amitriptyline QHS

## 2021-12-04 NOTE — Assessment & Plan Note (Addendum)
Required continued intubation post-procedure on 12/15 but eventually extubated successfully the same day.

## 2021-12-04 NOTE — Progress Notes (Signed)
The patient is injury-free, afebrile, alert, and oriented X 3. Vital signs were within the baseline during this shift. He continuously complains of bilateral arm pain. He was asking for 2 mg IV of hydromorphone every two hours. Pt denies chest pain, SOB, nausea, vomiting, dizziness, signs or symptoms of bleeding, or acute changes during this shift. We will continue to monitor and work toward achieving the care plan goals.

## 2021-12-04 NOTE — Assessment & Plan Note (Addendum)
Seen by psychiatry this admission. °-Continue Seroquel and Trazodone °

## 2021-12-04 NOTE — Plan of Care (Signed)

## 2021-12-04 NOTE — Assessment & Plan Note (Addendum)
He's interested in [facility] (recovery program) after discharge.

## 2021-12-04 NOTE — Assessment & Plan Note (Addendum)
Resolved

## 2021-12-04 NOTE — Progress Notes (Signed)
PROGRESS NOTE    Jason Buchanan  [id number] DOB: 23-Aug-1986 DOA: 11/14/2021 PCP: [name], NP  No chief complaint on file.   Brief Narrative:  Jason Buchanan. Jason Buchanan was admitted to the hospital with the working diagnosis of severe sepsis due to right upper extremities cellulitis, complicated with streptococcus bacteremia.    36 yo male with the past medical history of polysubstance abuse who presented with bilateral arm pain. Patient has Jason Buchanan h/o of injecting multiple drugs in both arms.   CT scan of upper extremities showed extensive cellulitis and he was placed on broad-spectrum antibiotic therapy.   12/13 I&D of bilateral upper extremities. Blood culture positive for Streptococcus group B.   12/15 patient developed melena with acute blood loss, hemoglobin down to 5.8. He received 2 units packed red blood cell transfusion and was placed on intravenous pantoprazole. 12/15 upper endoscopy showing mild proximal mid esophagitis.  Nonbleeding gastric ulcers with no stigmata of bleeding.  Positive adherent clot.  Nonbleeding duodenal ulcers with adherent clot.  Ulcers were injected. He had postprocedure respiratory failure that required invasive mechanical ventilation.  Extubated late 12/15.   12/20 Incision and drainage and debridement of right upper extremity, incision and drainage of left upper extremity.    12/23 bilateral arms I&D, the OR under anesthesia.    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   Sepsis due to cellulitis (HCC)   Polysubstance abuse ([place])    Hypokalemia   Right arm cellulitis   Staphylococcus aureus infection   Respiratory failure, post-operative (HCC)   * 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[place]) Seroquel, trazodone Seen by psych 12/17, no recommendation for inpatient psych  Bacterial skin infection of upper extremity 12/12 with CT's showing diffuse soft tissue swelling of R arm  S/p multiple I&D's - 12/13, 12/15, 12/16, 12/21, and 12/29 Continued RUE swelling today, follow CT of right humerus ID signed off, planning for daptomycin while inpatient, 30 days zyvox when ready for discharge (may need to reassess closer to discharge depending on how long he's here - 11/23/2021 was their sign off note) cx with strep pyogenes and MRSA  Streptococcal bacteremia Strep pyogenes from 11/14/2021 blood cx 12/14 cx NGTD Appreciate ID recs as above, daptomycin, then linezolid x30 days at discharge  Upper GI bleed 12/15 with mild proximal mid esophagitis, non bleeding gastric ulcers, non bleeding duodenal ulcers Per GI, if recurrent may need repeat endoscopy Path negative for h pyloir, antral and oxyntic mucosa with hyperemia PPI BID Avoid NSAIDs - using multiple prior to admission  Acute blood loss anemia Transfuse as needed S/p 4 units pRBC  IVDU (intravenous drug user)- (present on admission) He's interested in [place] after discharge  Electrolyte abnormality Replace and follow  Hepatitis C 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 Follow outpatient   Respiratory failure, post-operative ([place]) Extubated 12/15    DVT prophylaxis: scd Code Status: full Family Communication: none at bedside Disposition:   Status is: Inpatient  Remains inpatient appropriate because: need for additional hand procedures     Consultants:  Hand ID GI PCCM psych  Procedures:  See prior notes, several I&D's, orthopedic procedures by hand EGD with GI  Antimicrobials:  Anti-infectives (From admission, onward)     Start     Dose/Rate Route Frequency Ordered Stop   11/17/21 1700  cefTRIAXone (ROCEPHIN) 2 g in sodium chloride 0.9 % 100 mL IVPB  Status:  Discontinued        2 g 200 mL/hr over 30 Minutes Intravenous Every 24 hours 11/17/21 1019 11/21/21 1047   11/17/21 1700  DAPTOmycin (CUBICIN) 500 mg in sodium chloride 0.9 % IVPB        8 mg/kg  60 kg 120 mL/hr over 30 Minutes Intravenous Daily 11/17/21 1555     11/17/21 1200  metroNIDAZOLE (FLAGYL) IVPB 500 mg  Status:  Discontinued        500 mg 100 mL/hr over 60 Minutes Intravenous Every 12 hours 11/17/21 1019 11/18/21 0924   11/15/21 1000  cefTRIAXone (ROCEPHIN) 2 g in sodium chloride 0.9 % 100 mL IVPB  Status:  Discontinued        2 g 200 mL/hr over 30 Minutes Intravenous Every 24 hours 11/14/21 1834 11/15/21 0405   11/15/21 1000  clindamycin (CLEOCIN) IVPB 600 mg  Status:  Discontinued        600 mg 100 mL/hr over 30 Minutes Intravenous Every 8 hours 11/15/21 0405 11/15/21 1523   11/15/21 0800  penicillin G potassium 12 Million Units in dextrose 5 % 500 mL continuous infusion  Status:  Discontinued        12 Million Units 41.7 mL/hr over 12 Hours Intravenous Every 12 hours 11/15/21 0405 11/17/21 1956   11/14/21 2200  vancomycin (VANCOREADY) IVPB 750 mg/150 mL  Status:  Discontinued        750 mg 150 mL/hr over 60 Minutes Intravenous Every 12 hours 11/14/21 1524 11/15/21 0405   11/14/21 0900  vancomycin (VANCOCIN) IVPB 1000 mg/200 mL premix        1,000 mg 200 mL/hr over 60 Minutes Intravenous  Once 11/14/21 0849 11/14/21 1043   11/14/21 0900  cefTRIAXone (ROCEPHIN) 2 g in sodium chloride 0.9 % 100 mL IVPB        2 g 200 mL/hr over 30 Minutes Intravenous  Once 11/14/21 0849 11/14/21 0935       Subjective: C/o pain  Objective: Vitals:   12/03/21 1252 12/03/21 2121 12/04/21 0645 12/04/21 1709  BP: (!) 152/78 (!) 158/84 136/69 (!) 143/78  Pulse: (!) 110 (!) 105 (!) 106 (!) 108  Resp: 17 15 18 17   Temp: 98 F (36.7 C) 98.3 F  (36.8 C) 97.7 F (36.5 C) 98.8 F (37.1 C)  TempSrc: Oral Oral Oral   SpO2: 100% 100% 99% 100%  Weight:      Height:        Intake/Output Summary (Last 24 hours) at 12/04/2021 1855 Last data filed at 12/04/2021 1000 Gross per 24 hour  Intake 240 ml  Output 2430 ml  Net -2190 ml   Filed Weights   11/14/21 0835 11/15/21 1608 11/25/21 1342  Weight: 60 kg 60 kg 60 kg    Examination:  General exam: Appears calm and comfortable  Respiratory system: unlabored Cardiovascular system: RRR Gastrointestinal system: Abdomen is nondistended, soft and nontender. Central nervous system: Alert and oriented. No focal neurological deficits. Extremities: RUE with wound vac, bilateral upper extremity dressings, right fingers firm at tips, subjective numbness (this is chronic per discussion with Dr. Greta Doom), able to move L hand, sensation intact, soft fingertips Psychiatry: anxious  Data Reviewed: I have personally reviewed following labs and imaging studies  CBC: Recent Labs  Lab 11/29/21 0445 12/02/21 0442 12/04/21 1625  WBC 8.3 11.2*  --   NEUTROABS 4.9  --   --   HGB 7.7* 7.2* 7.1*  HCT 25.1* 23.3* 23.2*  MCV 90.6 88.9  --   PLT 686* 462*  --     Basic Metabolic Panel: Recent Labs  Lab 11/29/21 0445 11/30/21 0302 12/02/21 0442  NA 134*  --  134*  K 3.4*  --  4.1  CL 101  --  102  CO2 27  --  27  GLUCOSE 123*  --  159*  BUN 23*  --  21*  CREATININE 0.42*  --  0.44*  CALCIUM 8.1*  --  8.4*  MG 1.7 1.9  --     GFR: Estimated Creatinine Clearance: 109.4 mL/min (Azarias Chiou) (by C-G formula based on SCr of 0.44 mg/dL (L)).  Liver Function Tests: No results for input(s): AST, ALT, ALKPHOS, BILITOT, PROT, ALBUMIN in the last 168 hours.  CBG: No results for input(s): GLUCAP in the last 168 hours.   No results found for this or any previous visit (from the past 240 hour(s)).       Radiology Studies: No results found.      Scheduled Meds:  (feeding supplement)  PROSource Plus  30 mL Oral TID BM   acetaminophen  1,000 mg Oral Q8H   vitamin C  500 mg Oral Daily   Chlorhexidine Gluconate Cloth  6 each Topical Daily   feeding supplement  237 mL Oral BID BM   ferrous sulfate  325 mg Oral Q breakfast   nicotine  7 mg Transdermal Daily   pantoprazole  40 mg Oral BID   QUEtiapine  100 mg Oral BID   sodium chloride (PF)       Continuous Infusions:  DAPTOmycin (CUBICIN)  IV 500 mg (12/03/21 1927)     LOS: 20 days    Time spent: over 30 min    Fayrene Helper, MD [facility]   To contact the attending provider between 7A-7P or the covering provider during after hours 7P-7A, please log into the web site [email or web address] and access using universal [place] password for that web site. If you do not have the password, please call the hospital operator.  12/04/2021, 6:55 PM

## 2021-12-04 NOTE — Hospital Course (Addendum)
Past medical history of polysubstance abuse presents with bilateral arm pain.  He has history of injecting multiple drugs in both arms.  CT scan showed upper extremity extensive cellulitis, placed on prednisone antibiotics.  He was admitted with working diagnosis of severe sepsis with right upper extremity cellulitis complicated with streptococcal bacteremia.  He is followed by multiple specialist ID plastic surgery.  Patient has required multiple I&D's in addition to placement of a wound VAC and application of skin graft x2.  Admission has been complicated by GI bleed secondary to esophagitis/gastritis, acute GI bleeding requiring 5 units of PRBC transfusion, significant narcotic use for management of pain.

## 2021-12-04 NOTE — Assessment & Plan Note (Addendum)
Patient evaluated by GI. Upper endoscopy performed on 12/15 and was significant for gastric/duodenal ulcers in addition to esophagitis possibly related to NSAID use. Negative H. Pylori testing. GI recommendations (12/17) for Protonix BID x4 weeks with Protonix 40 mg daily for at least 6 months. Avoid NSAIDS -Continue Protonix 40 mg daily (decreased from BID dosing per GI recommendations)

## 2021-12-04 NOTE — Assessment & Plan Note (Addendum)
Blood cultures (11/14/2021) significant for Streptococcus pyogenes. Source of bacteremia is from patient's upper extremity skin infection. ID was consulted. Patient managed on Vancomycin/Ceftriaxone empirically for one day, was transitioned to Penicillin G for 3 days and finally transitioned to Ceftriaxone/Daptomycin followed by Daptomycin monotherapy; Daptomycin discontinued on 1/20 per ID recommendations. Transthoracic Echocardiogram (11/16/2021) significant for no overt evidence of valvular vegetation and ID recommendation for no Transesophageal Echocardiogram. Repeat blood cultures (11/16/2021) with no growth.

## 2021-12-04 NOTE — Assessment & Plan Note (Signed)
Follow outpatient  

## 2021-12-04 NOTE — Assessment & Plan Note (Addendum)
Secondary to upper GI bleeding. Patient has required 5 units of PRBC per transfusion this visit. Associated iron deficiency anemia. -Continue iron supplementation -Hgb had remained stable

## 2021-12-05 DIAGNOSIS — F15951 Other stimulant use, unspecified with stimulant-induced psychotic disorder with hallucinations: Secondary | ICD-10-CM | POA: Diagnosis not present

## 2021-12-05 LAB — COMPREHENSIVE METABOLIC PANEL
ALT: 25 U/L (ref 0–44)
AST: 22 U/L (ref 15–41)
Albumin: 2.8 g/dL — ABNORMAL LOW (ref 3.5–5.0)
Alkaline Phosphatase: 105 U/L (ref 38–126)
Anion gap: 5 (ref 5–15)
BUN: 14 mg/dL (ref 6–20)
CO2: 25 mmol/L (ref 22–32)
Calcium: 8.2 mg/dL — ABNORMAL LOW (ref 8.9–10.3)
Chloride: 102 mmol/L (ref 98–111)
Creatinine, Ser: 0.46 mg/dL — ABNORMAL LOW (ref 0.61–1.24)
GFR, Estimated: >60 mL/min (ref >60)
Glucose, Bld: 98 mg/dL (ref 70–99)
Potassium: 3.8 mmol/L (ref 3.5–5.1)
Sodium: 132 mmol/L — ABNORMAL LOW (ref 135–145)
Total Bilirubin: 0.4 mg/dL (ref 0.3–1.2)
Total Protein: 7.6 g/dL (ref 6.5–8.1)

## 2021-12-05 LAB — CBC WITH DIFFERENTIAL/PLATELET
Abs Immature Granulocytes: 0.08 10*3/uL — ABNORMAL HIGH (ref 0.00–0.07)
Basophils Absolute: 0.1 10*3/uL (ref 0.0–0.1)
Basophils Relative: 1 %
Eosinophils Absolute: 0.7 10*3/uL — ABNORMAL HIGH (ref 0.0–0.5)
Eosinophils Relative: 7 %
HCT: 26.1 % — ABNORMAL LOW (ref 39.0–52.0)
Hemoglobin: 7.8 g/dL — ABNORMAL LOW (ref 13.0–17.0)
Immature Granulocytes: 1 %
Lymphocytes Relative: 21 %
Lymphs Abs: 2.2 10*3/uL (ref 0.7–4.0)
MCH: 26.5 pg (ref 26.0–34.0)
MCHC: 29.9 g/dL — ABNORMAL LOW (ref 30.0–36.0)
MCV: 88.8 fL (ref 80.0–100.0)
Monocytes Absolute: 0.8 10*3/uL (ref 0.1–1.0)
Monocytes Relative: 8 %
Neutro Abs: 6.5 10*3/uL (ref 1.7–7.7)
Neutrophils Relative %: 62 %
Platelets: 521 10*3/uL — ABNORMAL HIGH (ref 150–400)
RBC: 2.94 MIL/uL — ABNORMAL LOW (ref 4.22–5.81)
RDW: 15.1 % (ref 11.5–15.5)
WBC: 10.4 10*3/uL (ref 4.0–10.5)
nRBC: 0 % (ref 0.0–0.2)

## 2021-12-05 LAB — PHOSPHORUS: Phosphorus: 4.4 mg/dL (ref 2.5–4.6)

## 2021-12-05 LAB — MAGNESIUM: Magnesium: 1.7 mg/dL (ref 1.7–2.4)

## 2021-12-05 MED ORDER — KETOROLAC TROMETHAMINE 15 MG/ML IJ SOLN
15.0000 mg | Freq: Four times a day (QID) | INTRAMUSCULAR | Status: DC
  Administered 2021-12-05 – 2021-12-09 (×14): 15 mg via INTRAVENOUS
  Filled 2021-12-05 (×14): qty 1

## 2021-12-05 MED ORDER — GABAPENTIN 300 MG PO CAPS
300.0000 mg | ORAL_CAPSULE | Freq: Every day | ORAL | Status: DC
  Administered 2021-12-05 – 2021-12-09 (×5): 300 mg via ORAL
  Filled 2021-12-05 (×5): qty 1

## 2021-12-05 NOTE — Progress Notes (Signed)
The patient is injury-free, afebrile, alert, and oriented X 3. Vital signs were within the baseline during this shift. He continuously complains of bilateral arm pain. He was asking for 2 mg IV of hydromorphone every two hours and oxycodone every 4 hours. He expressed that his pain is not well controlled with the current pain regimen. Pt denies chest pain, SOB, nausea, vomiting, dizziness, signs or symptoms of bleeding, or acute changes during this shift. We will continue to monitor and work toward achieving the care plan goals

## 2021-12-05 NOTE — Anesthesia Postprocedure Evaluation (Signed)
Anesthesia Post Note  Patient: Jason Buchanan  Procedure(s) Performed: INCISION AND DRAINAGE BILATERAL UPPER EXTREMITIES WITH  WOUND VAC CHANGE RIGHT (Bilateral: Arm Lower)     Patient location during evaluation: PACU Anesthesia Type: General Level of consciousness: awake and alert Pain management: pain level controlled Vital Signs Assessment: post-procedure vital signs reviewed and stable Respiratory status: spontaneous breathing, nonlabored ventilation, respiratory function stable and patient connected to nasal cannula oxygen Cardiovascular status: blood pressure returned to baseline and stable Postop Assessment: no apparent nausea or vomiting Anesthetic complications: no   No notable events documented.  Last Vitals:  Vitals:   12/04/21 2017 12/05/21 0632  BP: (!) 144/70 (!) 149/73  Pulse: (!) 110 (!) 102  Resp: 18 20  Temp: 36.8 C 36.9 C  SpO2: 100% 100%    Last Pain:  Vitals:   12/05/21 0632  TempSrc: Oral  PainSc:                  Lamari Youngers

## 2021-12-05 NOTE — Progress Notes (Signed)
Pharmacy Antibiotic Note  Jason Buchanan is a 36 y.o. male admitted on 11/14/2021 with bilateral upper extremity wounds now s/p fasciotomy and I&D.  Pharmacy has been consulted for daptomycin dosing.  Today, 12/05/21  - Day #19 Dapto - SCr stable at 0.44 (12/30)  - Ck 65 (12/16), 18 (12/23), 30 (12/30)  Plan: Continue Daptomycin 8 mg/kg daily as inpatient ID plans for antibiotics upon discharge rec 30 days Zyvox 600mg  bid Will f/u renal function weekly CK (next 1/6)  Height: 5\' 7"  (170.2 cm) Weight: 60 kg (132 lb 4.4 oz) IBW/kg (Calculated) : 66.1  Temp (24hrs), Avg:98.5 F (36.9 C), Min:98.2 F (36.8 C), Max:98.8 F (37.1 C)  Recent Labs  Lab 11/29/21 0445 12/02/21 0442  WBC 8.3 11.2*  CREATININE 0.42* 0.44*     Estimated Creatinine Clearance: 109.4 mL/min (A) (by C-G formula based on SCr of 0.44 mg/dL (L)).    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 Had prolonged block (>30 min) after succinylcholine for GI procedure.    Antimicrobials this admission:  12/12 rocephin/vanc >> 12/12 12/13 clinda >> 12/13 12/13 PCN G >> 12/15 12/15 CTX>12/19 12/15 Flagyl>>12/16 12/15 dapto >>  Dose adjustments this admission: none  Microbiology results:  12/12 BCx: 1/4 strep pyogenes; R EES, sens to others  12/13 left hand dorsal: abund streptococcus pyogenes, rare MRSA  12/13 R wrist: mod strep pyoges, rare MRSA, rare strep pneumoniae 12/14 repeat BCx: NGF 12/15 MRSA PCR: detected   1/15, Pharm.D 12/05/2021 10:11 AM

## 2021-12-05 NOTE — Plan of Care (Signed)
°  Problem: Education: Goal: Knowledge of General Education information will improve Description: Including pain rating scale, medication(s)/side effects and non-pharmacologic comfort measures Outcome: Progressing   Problem: Health Behavior/Discharge Planning: Goal: Ability to manage health-related needs will improve Outcome: Not Progressing   Problem: Clinical Measurements: Goal: Ability to maintain clinical measurements within normal limits will improve Outcome: Progressing Goal: Respiratory complications will improve Outcome: Progressing Goal: Cardiovascular complication will be avoided Outcome: Progressing   Problem: Activity: Goal: Risk for activity intolerance will decrease Outcome: Progressing   Problem: Nutrition: Goal: Adequate nutrition will be maintained Outcome: Progressing   Problem: Coping: Goal: Level of anxiety will decrease Outcome: Progressing   Problem: Elimination: Goal: Will not experience complications related to bowel motility Outcome: Progressing Goal: Will not experience complications related to urinary retention Outcome: Progressing   Problem: Pain Managment: Goal: General experience of comfort will improve Outcome: Not Progressing   Problem: Safety: Goal: Ability to remain free from injury will improve Outcome: Progressing   Problem: Skin Integrity: Goal: Risk for impaired skin integrity will decrease Outcome: Progressing   Problem: Clinical Measurements: Goal: Diagnostic test results will improve Outcome: Progressing Goal: Signs and symptoms of infection will decrease Outcome: Progressing   Problem: Respiratory: Goal: Ability to maintain adequate ventilation will improve Outcome: Progressing

## 2021-12-05 NOTE — Progress Notes (Signed)
PROGRESS NOTE    Jason Buchanan  VVZ:[id number] DOB: [birth year] DOA: 11/14/2021 PCP: Lavinia Sharps, NP  No chief complaint on file.   Brief Narrative:  Jason Buchanan. Throop was admitted to the hospital with the working diagnosis of severe sepsis due to right upper extremities cellulitis, complicated with streptococcus bacteremia.    36 yo male with the past medical history of polysubstance abuse who presented with bilateral arm pain. Patient has [name] h/o of injecting multiple drugs in both arms.   CT scan of upper extremities showed extensive cellulitis and he was placed on broad-spectrum antibiotic therapy.   12/13 I&D of bilateral upper extremities. Blood culture positive for Streptococcus group B.   12/15 patient developed melena with acute blood loss, hemoglobin down to 5.8. He received 2 units packed red blood cell transfusion and was placed on intravenous pantoprazole. 12/15 upper endoscopy showing mild proximal mid esophagitis.  Nonbleeding gastric ulcers with no stigmata of bleeding.  Positive adherent clot.  Nonbleeding duodenal ulcers with adherent clot.  Ulcers were injected. He had postprocedure respiratory failure that required invasive mechanical ventilation.  Extubated late 12/15.   12/20 Incision and drainage and debridement of right upper extremity, incision and drainage of left upper extremity.    12/23 bilateral arms I&D, the OR under anesthesia.    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HCC) Active Problems:   Streptococcal bacteremia   Bacterial skin infection of upper extremity   Upper GI bleed   Acute blood loss anemia   IVDU (intravenous drug user)   Electrolyte abnormality   Protein-calorie malnutrition, severe   Hepatitis C 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   Sepsis due to cellulitis (HCC)   Polysubstance abuse (HCC)    Hypokalemia   Right arm cellulitis   Staphylococcus aureus infection   Respiratory failure, post-operative (HCC)   * 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HCC) Seroquel, trazodone Seen by psych 12/17, no recommendation for inpatient psych  Bacterial skin infection of upper extremity 12/12 with CT's showing diffuse soft tissue swelling of R arm  S/p multiple I&D's - 12/13, 12/15, 12/16, 12/21, and 12/29 Continued RUE swelling today, follow CT of right humerus -> with new areas low attenuation in the medial arm musculature along the mid distal humerus level concerning for abscesses/pyomyositis Discussed with ortho, no plan for MRI at this time, they'll reeval for potential procedure ID signed off, planning for daptomycin while inpatient, 30 days zyvox when ready for discharge (may need to reassess closer to discharge depending on how long he's here - 11/23/2021 was their sign off note) cx with strep pyogenes and MRSA Upper extremity pain, adjust pain meds as needed - non opiate adjuncts as well  Streptococcal bacteremia Strep pyogenes from 11/14/2021 blood cx 12/14 cx NGTD Appreciate ID recs as above, daptomycin, then linezolid x30 days at discharge Echo 11/16/2021 with EF 55-60%, RVSF normal, abnormal MV (unchanged thickening from prior), AV thickening greater than expected for age, but stable from prior (mild to mod AV regurg) Per ID note, 12/16, GAS not typcial cause or concern for endocarditis, no TEE planned  Upper GI bleed 12/15 with mild proximal mid esophagitis, non bleeding gastric ulcers, non bleeding duodenal ulcers Per GI, if recurrent may need repeat endoscopy Path negative for h pyloir, antral and oxyntic mucosa with hyperemia PPI BID Avoid NSAIDs - using multiple  prior to admission  Acute blood loss anemia Transfuse as needed S/p 4 units pRBC Labs c/w iron def anemia Follow b12, folate  IVDU (intravenous drug user)- (present on  admission) He's interested in [place] after discharge  Electrolyte abnormality Replace and follow  Hepatitis C 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 Follow outpatient   Respiratory failure, post-operative ([place]) Extubated 12/15   DVT prophylaxis: scd Code Status: full Family Communication: none at bedside Disposition:   Status is: Inpatient  Remains inpatient appropriate because: need for additional hand procedures     Consultants:  Hand ID GI PCCM psych  Procedures:  See prior notes, several I&D's, orthopedic procedures by hand EGD with GI  Antimicrobials:  Anti-infectives (From admission, onward)    Start     Dose/Rate Route Frequency Ordered Stop   11/17/21 1700  cefTRIAXone (ROCEPHIN) 2 g in sodium chloride 0.9 % 100 mL IVPB  Status:  Discontinued        2 g 200 mL/hr over 30 Minutes Intravenous Every 24 hours 11/17/21 1019 11/21/21 1047   11/17/21 1700  DAPTOmycin (CUBICIN) 500 mg in sodium chloride 0.9 % IVPB        8 mg/kg  60 kg 120 mL/hr over 30 Minutes Intravenous Daily 11/17/21 1555     11/17/21 1200  metroNIDAZOLE (FLAGYL) IVPB 500 mg  Status:  Discontinued        500 mg 100 mL/hr over 60 Minutes Intravenous Every 12 hours 11/17/21 1019 11/18/21 0924   11/15/21 1000  cefTRIAXone (ROCEPHIN) 2 g in sodium chloride 0.9 % 100 mL IVPB  Status:  Discontinued        2 g 200 mL/hr over 30 Minutes Intravenous Every 24 hours 11/14/21 1834 11/15/21 0405   11/15/21 1000  clindamycin (CLEOCIN) IVPB 600 mg  Status:  Discontinued        600 mg 100 mL/hr over 30 Minutes Intravenous Every 8 hours 11/15/21 0405 11/15/21 1523   11/15/21 0800  penicillin G potassium 12 Million Units in dextrose 5 % 500 mL continuous infusion  Status:  Discontinued        12 Million Units 41.7 mL/hr over 12 Hours Intravenous Every 12 hours 11/15/21 0405 11/17/21 1956   11/14/21 2200  vancomycin (VANCOREADY) IVPB 750 mg/150 mL  Status:  Discontinued        750 mg 150 mL/hr over 60 Minutes Intravenous  Every 12 hours 11/14/21 1524 11/15/21 0405   11/14/21 0900  vancomycin (VANCOCIN) IVPB 1000 mg/200 mL premix        1,000 mg 200 mL/hr over 60 Minutes Intravenous  Once 11/14/21 0849 11/14/21 1043   11/14/21 0900  cefTRIAXone (ROCEPHIN) 2 g in sodium chloride 0.9 % 100 mL IVPB        2 g 200 mL/hr over 30 Minutes Intravenous  Once 11/14/21 0849 11/14/21 0935       Subjective: Continued complaint of pain  Objective: Vitals:   12/04/21 1709 12/04/21 2017 12/05/21 0632 12/05/21 1455  BP: (!) 143/78 (!) 144/70 (!) 149/73 (!) 143/70  Pulse: (!) 108 (!) 110 (!) 102 (!) 103  Resp: 17 18 20 20   Temp: 98.8 F (37.1 C) 98.2 F (36.8 C) 98.4 F (36.9 C) 97.7 F (36.5 C)  TempSrc:  Oral Oral Oral  SpO2: 100% 100% 100% 100%  Weight:      Height:        Intake/Output Summary (Last 24 hours) at 12/05/2021 1501 Last data filed at 12/05/2021 1209 Gross per 24 hour  Intake 820 ml  Output 2081 ml  Net -1261 ml   Filed Weights   11/14/21 0835 11/15/21 1608 11/25/21 1342  Weight: 60 kg 60 kg 60 kg    Examination:  General exam: Anxious, but comfortable Respiratory system: unlabored Cardiovascular system: RRR Central nervous system: Mayia Megill&Ox3 Extremities: RUE with wound vac, subjective numbness, firm fingertips (this is chronic per discussion with Dr. Greta Doom 1/1), erythema to medial bicep, firm, indurated, no crepitus or fluctuance palpated - able to move L hand, sensation intact, soft fingertips Psychiatry: anxious   Data Reviewed: I have personally reviewed following labs and imaging studies  CBC: Recent Labs  Lab 11/29/21 0445 12/02/21 0442 12/04/21 1625 12/05/21 1400  WBC 8.3 11.2*  --  10.4  NEUTROABS 4.9  --   --  6.5  HGB 7.7* 7.2* 7.1* 7.8*  HCT 25.1* 23.3* 23.2* 26.1*  MCV 90.6 88.9  --  88.8  PLT 686* 462*  --  521*    Basic Metabolic Panel: Recent Labs  Lab 11/29/21 0445 11/30/21 0302 12/02/21 0442 12/05/21 1400  NA 134*  --  134* 132*  K 3.4*  --  4.1 3.8   CL 101  --  102 102  CO2 27  --  27 25  GLUCOSE 123*  --  159* 98  BUN 23*  --  21* 14  CREATININE 0.42*  --  0.44* 0.46*  CALCIUM 8.1*  --  8.4* 8.2*  MG 1.7 1.9  --  1.7  PHOS  --   --   --  4.4    GFR: Estimated Creatinine Clearance: 109.4 mL/min (Shelia Kingsberry) (by C-G formula based on SCr of 0.46 mg/dL (L)).  Liver Function Tests: Recent Labs  Lab 12/05/21 1400  AST 22  ALT 25  ALKPHOS 105  BILITOT 0.4  PROT 7.6  ALBUMIN 2.8*    CBG: No results for input(s): GLUCAP in the last 168 hours.   No results found for this or any previous visit (from the past 240 hour(s)).       Radiology Studies: CT HUMERUS RIGHT W CONTRAST  Result Date: 12/05/2021 CLINICAL DATA:  Open wound.  Pain and swelling. EXAM: CT OF THE UPPER RIGHT EXTREMITY WITH CONTRAST TECHNIQUE: Multidetector CT imaging of the upper right extremity was performed according to the standard protocol following intravenous contrast administration. CONTRAST:  4mL OMNIPAQUE IOHEXOL 350 MG/ML SOLN COMPARISON:  CT scan 11/14/2021 FINDINGS: There is an open wound noted along the volar aspect of the right arm just below the antecubital fossa. Surgical clips are in place. Skin thickening and diffuse subcutaneous edema consistent with cellulitis. Overall, the cellulitis looks much improved when compared to the prior CT scan. New areas low attenuation in the medial arm musculature along the mid distal humerus level worrisome for abscesses/pyomyositis. No findings suspicious for septic arthritis at the shoulder or elbow joints. No findings suspicious for osteomyelitis. IMPRESSION: 1. Open wound along the volar aspect of the right arm just below the antecubital fossa. 2. Diffuse cellulitis but improved when compared to prior study. 3. New areas low attenuation in the medial arm musculature along the mid distal humerus level worrisome for abscesses/pyomyositis. MRI may be helpful for further evaluation. 4. No findings suspicious for septic  arthritis or osteomyelitis. Electronically Signed   By: Marijo Sanes M.D.   On: 12/05/2021 09:12        Scheduled Meds:  (feeding supplement) PROSource Plus  30 mL Oral TID BM   acetaminophen  1,000 mg Oral  Q8H   vitamin C  500 mg Oral Daily   Chlorhexidine Gluconate Cloth  6 each Topical Daily   feeding supplement  237 mL Oral BID BM   ferrous sulfate  325 mg Oral Q breakfast   gabapentin  300 mg Oral QHS   ketorolac  15 mg Intravenous Q6H   nicotine  7 mg Transdermal Daily   pantoprazole  40 mg Oral BID   polyethylene glycol  17 g Oral BID   QUEtiapine  100 mg Oral BID   senna-docusate  2 tablet Oral QHS   Continuous Infusions:  DAPTOmycin (CUBICIN)  IV Stopped (12/04/21 1949)     LOS: 21 days    Time spent: over 36 min    Fayrene Helper, MD [facility]   To contact the attending provider between 7A-7P or the covering provider during after hours 7P-7A, please log into the web site [email or web address] and access using universal [place] password for that web site. If you do not have the password, please call the hospital operator.  12/05/2021, 3:01 PM

## 2021-12-06 ENCOUNTER — Telehealth: Payer: Self-pay | Admitting: Plastic Surgery

## 2021-12-06 DIAGNOSIS — Z452 Encounter for adjustment and management of vascular access device: Secondary | ICD-10-CM

## 2021-12-06 DIAGNOSIS — L03113 Cellulitis of right upper limb: Secondary | ICD-10-CM

## 2021-12-06 LAB — FOLATE: Folate: 21.6 ng/mL (ref >5.9)

## 2021-12-06 LAB — CBC WITH DIFFERENTIAL/PLATELET
Abs Immature Granulocytes: 0.04 10*3/uL (ref 0.00–0.07)
Basophils Absolute: 0.1 10*3/uL (ref 0.0–0.1)
Basophils Relative: 1 %
Eosinophils Absolute: 0.9 10*3/uL — ABNORMAL HIGH (ref 0.0–0.5)
Eosinophils Relative: 11 %
HCT: 25.2 % — ABNORMAL LOW (ref 39.0–52.0)
Hemoglobin: 7.5 g/dL — ABNORMAL LOW (ref 13.0–17.0)
Immature Granulocytes: 1 %
Lymphocytes Relative: 28 %
Lymphs Abs: 2.5 10*3/uL (ref 0.7–4.0)
MCH: 26.5 pg (ref 26.0–34.0)
MCHC: 29.8 g/dL — ABNORMAL LOW (ref 30.0–36.0)
MCV: 89 fL (ref 80.0–100.0)
Monocytes Absolute: 0.6 10*3/uL (ref 0.1–1.0)
Monocytes Relative: 7 %
Neutro Abs: 4.6 10*3/uL (ref 1.7–7.7)
Neutrophils Relative %: 52 %
Platelets: 527 10*3/uL — ABNORMAL HIGH (ref 150–400)
RBC: 2.83 MIL/uL — ABNORMAL LOW (ref 4.22–5.81)
RDW: 15.1 % (ref 11.5–15.5)
WBC: 8.7 10*3/uL (ref 4.0–10.5)
nRBC: 0 % (ref 0.0–0.2)

## 2021-12-06 LAB — COMPREHENSIVE METABOLIC PANEL
ALT: 22 U/L (ref 0–44)
AST: 21 U/L (ref 15–41)
Albumin: 2.4 g/dL — ABNORMAL LOW (ref 3.5–5.0)
Alkaline Phosphatase: 100 U/L (ref 38–126)
Anion gap: 5 (ref 5–15)
BUN: 15 mg/dL (ref 6–20)
CO2: 27 mmol/L (ref 22–32)
Calcium: 8.4 mg/dL — ABNORMAL LOW (ref 8.9–10.3)
Chloride: 106 mmol/L (ref 98–111)
Creatinine, Ser: 0.49 mg/dL — ABNORMAL LOW (ref 0.61–1.24)
GFR, Estimated: >60 mL/min (ref >60)
Glucose, Bld: 104 mg/dL — ABNORMAL HIGH (ref 70–99)
Potassium: 3.7 mmol/L (ref 3.5–5.1)
Sodium: 138 mmol/L (ref 135–145)
Total Bilirubin: 0.3 mg/dL (ref 0.3–1.2)
Total Protein: 6.9 g/dL (ref 6.5–8.1)

## 2021-12-06 LAB — VITAMIN B12: Vitamin B-12: 455 pg/mL (ref 180–914)

## 2021-12-06 LAB — MAGNESIUM: Magnesium: 1.8 mg/dL (ref 1.7–2.4)

## 2021-12-06 LAB — PHOSPHORUS: Phosphorus: 5.5 mg/dL — ABNORMAL HIGH (ref 2.5–4.6)

## 2021-12-06 MED ORDER — NICOTINE 21 MG/24HR TD PT24
21.0000 mg | MEDICATED_PATCH | Freq: Every day | TRANSDERMAL | Status: DC
  Administered 2021-12-06 – 2022-01-17 (×43): 21 mg via TRANSDERMAL
  Filled 2021-12-06 (×43): qty 1

## 2021-12-06 MED ORDER — NICOTINE POLACRILEX 2 MG MT GUM
2.0000 mg | CHEWING_GUM | OROMUCOSAL | Status: DC | PRN
  Administered 2021-12-12 – 2022-01-18 (×74): 2 mg via ORAL
  Filled 2021-12-06 (×104): qty 1

## 2021-12-06 NOTE — Assessment & Plan Note (Addendum)
IJ placed on 12/15. Removed 1/3.

## 2021-12-06 NOTE — Progress Notes (Signed)
PROGRESS NOTE    Jason Buchanan  [id number] DOB: 08-01-1986 DOA: 11/14/2021 PCP: Jason Coots, NP  No chief complaint on file.   Brief Narrative:  Jason Buchanan was admitted to the hospital with the working diagnosis of severe sepsis due to right upper extremities cellulitis, complicated with streptococcus bacteremia.    36 yo male with the past medical history of polysubstance abuse who presented with bilateral arm pain. Patient has [name] h/o of injecting multiple drugs in both arms.   CT scan of upper extremities showed extensive cellulitis and he was placed on broad-spectrum antibiotic therapy.   12/13 I&D of bilateral upper extremities. Blood culture positive for Streptococcus group B.   12/15 patient developed melena with acute blood loss, hemoglobin down to 5.8. He received 2 units packed red blood cell transfusion and was placed on intravenous pantoprazole. 12/15 upper endoscopy showing mild proximal mid esophagitis.  Nonbleeding gastric ulcers with no stigmata of bleeding.  Positive adherent clot.  Nonbleeding duodenal ulcers with adherent clot.  Ulcers were injected. He had postprocedure respiratory failure that required invasive mechanical ventilation.  Extubated late 12/15.   12/20 Incision and drainage and debridement of right upper extremity, incision and drainage of left upper extremity.    12/23 bilateral arms I&D, the OR under anesthesia.    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[name]) Active Problems:   Streptococcal bacteremia   Bacterial skin infection of upper extremity   Upper GI bleed   Acute blood loss anemia   IVDU (intravenous drug user)   Electrolyte abnormality   Protein-calorie malnutrition, severe   Hepatitis C 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   Encounter for central line care   Sepsis due to cellulitis ([name])    Polysubstance abuse ([name])   Hypokalemia   Right arm cellulitis   Staphylococcus aureus infection   Respiratory failure, post-operative (HCC)   * 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[name]) Seroquel, trazodone Seen by psych 12/17, no recommendation for inpatient psych  Bacterial skin infection of upper extremity 12/12 with CT's showing diffuse soft tissue swelling of R arm  S/p multiple I&D's - 12/13, 12/15, 12/16, 12/21, and 12/29 Continued RUE swelling today, follow CT of right humerus -> with new areas low attenuation in the medial arm musculature along the mid distal humerus level concerning for abscesses/pyomyositis Discussed with ortho, no plan for MRI at this time, they'll reeval for potential procedure Plan for OR with hand 1/4 Plastics following as well ID signed off, planning for daptomycin while inpatient, 30 days zyvox when ready for discharge (may need to reassess closer to discharge depending on how long he's here - 11/23/2021 was their sign off note) cx with strep pyogenes and MRSA Upper extremity pain, adjust pain meds as needed - non opiate adjuncts as well  Streptococcal bacteremia Strep pyogenes from 11/14/2021 blood cx 12/14 cx NGTD Appreciate ID recs as above, daptomycin, then linezolid x30 days at discharge Echo 11/16/2021 with EF 55-60%, RVSF normal, abnormal MV (unchanged thickening from prior), AV thickening greater than expected for age, but stable from prior (mild to mod AV regurg) Per ID note, 12/16, GAS not typcial cause or concern for endocarditis, no TEE planned  Upper GI bleed 12/15 with mild proximal mid esophagitis, non bleeding gastric ulcers, non bleeding duodenal ulcers Per GI, if recurrent may need repeat endoscopy Path  negative for h pyloir, antral and oxyntic mucosa with hyperemia PPI BID Avoid NSAIDs - using multiple prior to admission  Acute blood loss anemia Transfuse as needed S/p 4 units pRBC Labs c/w  iron def anemia Follow b12, folate (wnl)  IVDU (intravenous drug user)- (present on admission) He's interested in [name] after discharge  Electrolyte abnormality Replace and follow  Hepatitis C 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 Follow outpatient   Encounter for central line care In place since 11/17/2021 Has PIV at this time Will plan to d/c central line  Respiratory failure, post-operative ([name]) Extubated 12/15   DVT prophylaxis: scd Code Status: full Family Communication: none at bedside Disposition:   Status is: Inpatient  Remains inpatient appropriate because: need for additional hand procedures     Consultants:  Hand ID GI PCCM psych  Procedures:  See prior notes, several I&D's, orthopedic procedures by hand EGD with GI  Antimicrobials:  Anti-infectives (From admission, onward)    Start     Dose/Rate Route Frequency Ordered Stop   11/17/21 1700  cefTRIAXone (ROCEPHIN) 2 g in sodium chloride 0.9 % 100 mL IVPB  Status:  Discontinued        2 g 200 mL/hr over 30 Minutes Intravenous Every 24 hours 11/17/21 1019 11/21/21 1047   11/17/21 1700  DAPTOmycin (CUBICIN) 500 mg in sodium chloride 0.9 % IVPB        8 mg/kg  60 kg 120 mL/hr over 30 Minutes Intravenous Daily 11/17/21 1555     11/17/21 1200  metroNIDAZOLE (FLAGYL) IVPB 500 mg  Status:  Discontinued        500 mg 100 mL/hr over 60 Minutes Intravenous Every 12 hours 11/17/21 1019 11/18/21 0924   11/15/21 1000  cefTRIAXone (ROCEPHIN) 2 g in sodium chloride 0.9 % 100 mL IVPB  Status:  Discontinued        2 g 200 mL/hr over 30 Minutes Intravenous Every 24 hours 11/14/21 1834 11/15/21 0405   11/15/21 1000  clindamycin (CLEOCIN) IVPB 600 mg  Status:  Discontinued        600 mg 100 mL/hr over 30 Minutes Intravenous Every 8 hours 11/15/21 0405 11/15/21 1523   11/15/21 0800  penicillin G potassium 12 Million Units in dextrose 5 % 500 mL continuous infusion  Status:  Discontinued        12 Million Units 41.7 mL/hr over  12 Hours Intravenous Every 12 hours 11/15/21 0405 11/17/21 1956   11/14/21 2200  vancomycin (VANCOREADY) IVPB 750 mg/150 mL  Status:  Discontinued        750 mg 150 mL/hr over 60 Minutes Intravenous Every 12 hours 11/14/21 1524 11/15/21 0405   11/14/21 0900  vancomycin (VANCOCIN) IVPB 1000 mg/200 mL premix        1,000 mg 200 mL/hr over 60 Minutes Intravenous  Once 11/14/21 0849 11/14/21 1043   11/14/21 0900  cefTRIAXone (ROCEPHIN) 2 g in sodium chloride 0.9 % 100 mL IVPB        2 g 200 mL/hr over 30 Minutes Intravenous  Once 11/14/21 0849 11/14/21 0935       Subjective: Continued pain  Objective: Vitals:   12/05/21 2006 12/06/21 0434 12/06/21 1522 12/06/21 1930  BP: (!) 144/69 (!) 153/82 (!) 141/77 133/85  Pulse: (!) 103 (!) 103 (!) 109 100  Resp: 20 18 18 20   Temp: 97.9 F (36.6 C) 97.7 F (36.5 C) 98.1 F (36.7 C) 98.2 F (36.8 C)  TempSrc: Oral Oral Oral Oral  SpO2: 100% 100% 99%  100%  Weight:      Height:        Intake/Output Summary (Last 24 hours) at 12/06/2021 1934 Last data filed at 12/06/2021 1834 Gross per 24 hour  Intake 960 ml  Output 900 ml  Net 60 ml   Filed Weights   11/14/21 0835 11/15/21 1608 11/25/21 1342  Weight: 60 kg 60 kg 60 kg    Examination:  General: No acute distress. Cardiovascular: RRR Lungs: unlabored Abdomen: Soft, nontender, nondistended Neurological: Alert and oriented 3. Moves all extremities 4 with equal strength. Cranial nerves II through XII grossly intact. Extremities: bilateral upper extremity dressing intact    Data Reviewed: I have personally reviewed following labs and imaging studies  CBC: Recent Labs  Lab 12/02/21 0442 12/04/21 1625 12/05/21 1400 12/06/21 0507  WBC 11.2*  --  10.4 8.7  NEUTROABS  --   --  6.5 4.6  HGB 7.2* 7.1* 7.8* 7.5*  HCT 23.3* 23.2* 26.1* 25.2*  MCV 88.9  --  88.8 89.0  PLT 462*  --  521* 527*    Basic Metabolic Panel: Recent Labs  Lab 11/30/21 0302 12/02/21 0442  12/05/21 1400 12/06/21 0507  NA  --  134* 132* 138  K  --  4.1 3.8 3.7  CL  --  102 102 106  CO2  --  27 25 27   GLUCOSE  --  159* 98 104*  BUN  --  21* 14 15  CREATININE  --  0.44* 0.46* 0.49*  CALCIUM  --  8.4* 8.2* 8.4*  MG 1.9  --  1.7 1.8  PHOS  --   --  4.4 5.5*    GFR: Estimated Creatinine Clearance: 109.4 mL/min (Terris Germano) (by C-G formula based on SCr of 0.49 mg/dL (L)).  Liver Function Tests: Recent Labs  Lab 12/05/21 1400 12/06/21 0507  AST 22 21  ALT 25 22  ALKPHOS 105 100  BILITOT 0.4 0.3  PROT 7.6 6.9  ALBUMIN 2.8* 2.4*    CBG: No results for input(s): GLUCAP in the last 168 hours.   No results found for this or any previous visit (from the past 240 hour(s)).       Radiology Studies: CT HUMERUS RIGHT W CONTRAST  Result Date: 12/05/2021 CLINICAL DATA:  Open wound.  Pain and swelling. EXAM: CT OF THE UPPER RIGHT EXTREMITY WITH CONTRAST TECHNIQUE: Multidetector CT imaging of the upper right extremity was performed according to the standard protocol following intravenous contrast administration. CONTRAST:  25mL OMNIPAQUE IOHEXOL 350 MG/ML SOLN COMPARISON:  CT scan 11/14/2021 FINDINGS: There is an open wound noted along the volar aspect of the right arm just below the antecubital fossa. Surgical clips are in place. Skin thickening and diffuse subcutaneous edema consistent with cellulitis. Overall, the cellulitis looks much improved when compared to the prior CT scan. New areas low attenuation in the medial arm musculature along the mid distal humerus level worrisome for abscesses/pyomyositis. No findings suspicious for septic arthritis at the shoulder or elbow joints. No findings suspicious for osteomyelitis. IMPRESSION: 1. Open wound along the volar aspect of the right arm just below the antecubital fossa. 2. Diffuse cellulitis but improved when compared to prior study. 3. New areas low attenuation in the medial arm musculature along the mid distal humerus level worrisome  for abscesses/pyomyositis. MRI may be helpful for further evaluation. 4. No findings suspicious for septic arthritis or osteomyelitis. Electronically Signed   By: Marijo Sanes M.D.   On: 12/05/2021 09:12  Scheduled Meds:  (feeding supplement) PROSource Plus  30 mL Oral TID BM   acetaminophen  1,000 mg Oral Q8H   vitamin C  500 mg Oral Daily   Chlorhexidine Gluconate Cloth  6 each Topical Daily   feeding supplement  237 mL Oral BID BM   ferrous sulfate  325 mg Oral Q breakfast   gabapentin  300 mg Oral QHS   ketorolac  15 mg Intravenous Q6H   nicotine  21 mg Transdermal Daily   pantoprazole  40 mg Oral BID   polyethylene glycol  17 g Oral BID   QUEtiapine  100 mg Oral BID   senna-docusate  2 tablet Oral QHS   Continuous Infusions:  DAPTOmycin (CUBICIN)  IV Stopped (12/05/21 2055)     LOS: 22 days    Time spent: over 30 min    Fayrene Helper, MD [facility]   To contact the attending provider between 7A-7P or the covering provider during after hours 7P-7A, please log into the web site [email or web address] and access using universal [place] password for that web site. If you do not have the password, please call the hospital operator.  12/06/2021, 7:34 PM

## 2021-12-06 NOTE — H&P (View-Only) (Signed)
Reason for Consult: Possible skin substitute or STSG volar aspect right forearm. Referring Physician: Dr. Theora Gianotti is an 36 y.o. male.  HPI: Patient is a 36 year old male with PMH significant for polysubstance abuse including IVDA who was admitted to the hospital 11/14/2021 for sepsis secondary to bilateral upper extremity cellulitis.  He was found to be bacteremic, cultures positive for strep pyogenes.  Wound cultures suggest polymicrobial infection. ID is evaluated patient and signed off, planning for daptomycin while inpatient and then 30 days of Zyvox upon discharge.  Hospitalization was complicated by acute blood loss anemia with hemoglobin down to 5.8 requiring 2 units PRBC and IV pantoprazole.  Positive melena, nonbleeding duodenal ulcers on upper endoscopy.  Dr. Lenon Curt and Dr. Greta Doom has collectively performed multiple I&D's since time of admission.  Most recent I&D performed 12/03/2021 with wound VAC application to right forearm and incisional VAC over the carpal tunnel incision.  Given persistent right upper extremity swelling, repeat CT was obtained 12/05/2021 which revealed improved cellulitis, but no areas of low-attenuation along mid distal humerus worrisome for abscesses versus pyomyositis.  Persistent wound noted along volar aspect of right arm just below antecubital fossa.  These findings were d/w orthopedics who reportedly plans to reevaluate.    Right-sided wound VAC was changed most recently on 12/01/2021.  At that time, there was good, beefy red granulation tissue noted.  Our group has been consulted for possible STSG or skin substitute placement.  Today on my examination, he tells me that he does feel as though the infection is ascending.  Complains of upper arm discomfort medially.  This is consistent with the new findings on his repeat CT.  He had an episode of nausea and emesis last night, but now controlled.  Denied hematemesis.  He tells me that his GI bleed was likely  due to him spending $15 per day on NSAIDs for his bilateral arm discomfort prior to coming to the ED for evaluation.  He reports that overall his pain has slowly begun to improve during his hospitalization and is going to try to transition to oral analgesics exclusively beginning after his next wound VAC change.  He tells me that he would be agreeable to skin substitute or STSG.  Patient plans to go to outpatient substance use center after he is ultimately discharged.   Past Medical History:  Diagnosis Date   Asthma    Mood disorder ([place])    Polysubstance abuse ([place])     Past Surgical History:  Procedure Laterality Date   ANKLE SURGERY     APPLICATION OF WOUND VAC Right 11/22/2021   Procedure: APPLICATION OF WOUND VAC;  Surgeon: Dayna Barker, MD;  Location: [facility];  Service: Plastics;  Laterality: Right;   APPLICATION OF WOUND VAC Right 11/25/2021   Procedure: APPLICATION OF WOUND VAC;  Surgeon: Dayna Barker, MD;  [facility];  Service: Plastics;  Laterality: Right;   BIOPSY  11/17/2021   Procedure: BIOPSY;  Surgeon: Clarene Essex, MD;  [facility];  Service: Endoscopy;;   BUBBLE STUDY  09/30/2021   Procedure: BUBBLE STUDY;  Surgeon: Dixie Dials, MD;  Location: [facility];  Service: Cardiovascular;;   ESOPHAGOGASTRODUODENOSCOPY (EGD) WITH PROPOFOL N/A 11/17/2021   Procedure: ESOPHAGOGASTRODUODENOSCOPY (EGD) WITH PROPOFOL;  Surgeon: Clarene Essex, MD;  [facility];  Service: Endoscopy;  Laterality: N/A;   FRACTURE SURGERY     I & D EXTREMITY Right 09/27/2021   Procedure: IRRIGATION AND DEBRIDEMENT OF ABSCESS RIGHT HAND;  Surgeon:  Dayna Barker, MD;  Location: [facility];  Service: Plastics;  Laterality: Right;   I & D EXTREMITY Bilateral 11/15/2021   Procedure: IRRIGATION AND DEBRIDEMENT EXTREMITY;  Surgeon: Dayna Barker, MD;  [facility];  Service: Plastics;  Laterality: Bilateral;   I & D EXTREMITY Right 11/17/2021   Procedure: IRRIGATION AND  DEBRIDEMENT EXTREMITY;  Surgeon: Dayna Barker, MD;  [facility];  Service: Plastics;  Laterality: Right;   I & D EXTREMITY Bilateral 12/01/2021   Procedure: INCISION AND DRAINAGE BILATERAL UPPER EXTREMITIES WITH  WOUND VAC CHANGE RIGHT;  Surgeon: Orene Desanctis, MD;  [facility];  Service: Orthopedics;  Laterality: Bilateral;   INCISION AND DRAINAGE OF WOUND Left 09/27/2021   Procedure: IRRIGATION AND DEBRIDEMENT OF ABSCESS LEFT INDEX FINGER;  Surgeon: Dayna Barker, MD;  Location: [facility];  Service: Plastics;  Laterality: Left;   INCISION AND DRAINAGE OF WOUND Bilateral 11/22/2021   Procedure: IRRIGATION AND DEBRIDEMENT WOUND;  Surgeon: Dayna Barker, MD;  [facility];  Service: Plastics;  Laterality: Bilateral;   INCISION AND DRAINAGE OF WOUND Bilateral 11/25/2021   Procedure: BILATERAL IRRIGATION AND DEBRIDEMENT BILATERAL ARMS;  Surgeon: Dayna Barker, MD;  [facility];  Service: Plastics;  Laterality: Bilateral;   JOINT REPLACEMENT     SCLEROTHERAPY  11/17/2021   Procedure: SCLEROTHERAPY;  Surgeon: Clarene Essex, MD;  [facility];  Service: Endoscopy;;   TEE WITHOUT CARDIOVERSION N/A 09/30/2021   Procedure: TRANSESOPHAGEAL ECHOCARDIOGRAM (TEE);  Surgeon: Dixie Dials, MD;  Location: [facility];  Service: Cardiovascular;  Laterality: N/A;    History reviewed. No pertinent family history.  Social History:  reports that he has been smoking cigarettes. He has been smoking an average of .5 packs per day. He has never used smokeless tobacco. He reports that he does not currently use alcohol. He reports that he does not currently use drugs after having used the following drugs: IV.  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 Had prolonged block (>30 min) after succinylcholine for GI procedure.     Medications: I have  reviewed the patient's current medications.  Results for orders placed or performed during the hospital encounter of 11/14/21 (from the past 48 hour(s))  Hemoglobin and hematocrit, blood     Status: Abnormal   Collection Time: 12/04/21  4:25 PM  Result Value Ref Range   Hemoglobin 7.1 (L) 13.0 - 17.0 g/dL   HCT 23.2 (L) 39.0 - 52.0 %    Comment: Performed at [facility], [place] [address], [place], [place] [address]  Type and screen [place]     Status: None   Collection Time: 12/04/21  4:25 PM  Result Value Ref Range   ABO/RH(D) A POS    Antibody Screen NEG    Sample Expiration      12/07/2021,2359 Performed at [facility], [place] [address], [place], [place] [address]   CBC with Differential/Platelet     Status: Abnormal   Collection Time: 12/05/21  2:00 PM  Result Value Ref Range   WBC 10.4 4.0 - 10.5 K/uL   RBC 2.94 (L) 4.22 - 5.81 MIL/uL   Hemoglobin 7.8 (L) 13.0 - 17.0 g/dL   HCT 26.1 (L) 39.0 - 52.0 %   MCV 88.8 80.0 - 100.0 fL   MCH 26.5 26.0 - 34.0 pg   MCHC  29.9 (L) 30.0 - 36.0 g/dL   RDW 15.1 11.5 - 15.5 %   Platelets 521 (H) 150 - 400 K/uL   nRBC 0.0 0.0 - 0.2 %   Neutrophils Relative % 62 %   Neutro Abs 6.5 1.7 - 7.7 K/uL   Lymphocytes Relative 21 %   Lymphs Abs 2.2 0.7 - 4.0 K/uL   Monocytes Relative 8 %   Monocytes Absolute 0.8 0.1 - 1.0 K/uL   Eosinophils Relative 7 %   Eosinophils Absolute 0.7 (H) 0.0 - 0.5 K/uL   Basophils Relative 1 %   Basophils Absolute 0.1 0.0 - 0.1 K/uL   Immature Granulocytes 1 %   Abs Immature Granulocytes 0.08 (H) 0.00 - 0.07 K/uL    Comment: Performed at [facility], [place] [address] [place], [place] [address]  Comprehensive metabolic panel     Status: Abnormal   Collection Time: 12/05/21  2:00 PM  Result Value Ref Range   Sodium 132 (L) 135 - 145 mmol/L   Potassium 3.8 3.5 - 5.1 mmol/L   Chloride 102 98 - 111 mmol/L   CO2 25 22 - 32 mmol/L    Glucose, Bld 98 70 - 99 mg/dL    Comment: Glucose reference range applies only to samples taken after fasting for at least 8 hours.   BUN 14 6 - 20 mg/dL   Creatinine, Ser 0.46 (L) 0.61 - 1.24 mg/dL   Calcium 8.2 (L) 8.9 - 10.3 mg/dL   Total Protein 7.6 6.5 - 8.1 g/dL   Albumin 2.8 (L) 3.5 - 5.0 g/dL   AST 22 15 - 41 U/L   ALT 25 0 - 44 U/L   Alkaline Phosphatase 105 38 - 126 U/L   Total Bilirubin 0.4 0.3 - 1.2 mg/dL   GFR, Estimated >60 >60 mL/min    Comment: (NOTE) Calculated using the CKD-EPI Creatinine Equation (2021)    Anion gap 5 5 - 15    Comment: Performed at [facility], [place] [address] [place], [place] [address]  Magnesium     Status: None   Collection Time: 12/05/21  2:00 PM  Result Value Ref Range   Magnesium 1.7 1.7 - 2.4 mg/dL    Comment: Performed at [facility], [place] [address], [place], [place] [address]  Phosphorus     Status: None   Collection Time: 12/05/21  2:00 PM  Result Value Ref Range   Phosphorus 4.4 2.5 - 4.6 mg/dL    Comment: Performed at [facility], [place] [address], [place], [place] [address]  CBC with Differential/Platelet     Status: Abnormal   Collection Time: 12/06/21  5:07 AM  Result Value Ref Range   WBC 8.7 4.0 - 10.5 K/uL   RBC 2.83 (L) 4.22 - 5.81 MIL/uL   Hemoglobin 7.5 (L) 13.0 - 17.0 g/dL   HCT 25.2 (L) 39.0 - 52.0 %   MCV 89.0 80.0 - 100.0 fL   MCH 26.5 26.0 - 34.0 pg   MCHC 29.8 (L) 30.0 - 36.0 g/dL   RDW 15.1 11.5 - 15.5 %   Platelets 527 (H) 150 - 400 K/uL   nRBC 0.0 0.0 - 0.2 %   Neutrophils Relative % 52 %   Neutro Abs 4.6 1.7 - 7.7 K/uL   Lymphocytes Relative 28 %   Lymphs Abs 2.5 0.7 - 4.0 K/uL   Monocytes Relative 7 %   Monocytes Absolute 0.6 0.1 - 1.0 K/uL   Eosinophils  Relative 11 %  ° Eosinophils Absolute 0.9 (H) 0.0 - 0.5 K/uL  ° Basophils Relative 1 %  ° Basophils Absolute 0.1 0.0 - 0.1 K/uL  ° Immature Granulocytes 1 %  ° Abs Immature Granulocytes 0.04 0.00  - 0.07 K/uL  °  Comment: Performed at [facility], [address] [place], [place] [address]  °Comprehensive metabolic panel     Status: Abnormal  ° Collection Time: 12/06/21  5:07 AM  °Result Value Ref Range  ° Sodium 138 135 - 145 mmol/L  ° Potassium 3.7 3.5 - 5.1 mmol/L  ° Chloride 106 98 - 111 mmol/L  ° CO2 27 22 - 32 mmol/L  ° Glucose, Bld 104 (H) 70 - 99 mg/dL  °  Comment: Glucose reference range applies only to samples taken after fasting for at least 8 hours.  ° BUN 15 6 - 20 mg/dL  ° Creatinine, Ser 0.49 (L) 0.61 - 1.24 mg/dL  ° Calcium 8.4 (L) 8.9 - 10.3 mg/dL  ° Total Protein 6.9 6.5 - 8.1 g/dL  ° Albumin 2.4 (L) 3.5 - 5.0 g/dL  ° AST 21 15 - 41 U/L  ° ALT 22 0 - 44 U/L  ° Alkaline Phosphatase 100 38 - 126 U/L  ° Total Bilirubin 0.3 0.3 - 1.2 mg/dL  ° GFR, Estimated >60 >60 mL/min  °  Comment: (NOTE) °Calculated using the CKD-EPI Creatinine Equation (2021) °  ° Anion gap 5 5 - 15  °  Comment: Performed at [facility], [address] [place], [place] [address]  °Magnesium     Status: None  ° Collection Time: 12/06/21  5:07 AM  °Result Value Ref Range  ° Magnesium 1.8 1.7 - 2.4 mg/dL  °  Comment: Performed at [facility], [address] [place], [place] [address]  °Phosphorus     Status: Abnormal  ° Collection Time: 12/06/21  5:07 AM  °Result Value Ref Range  ° Phosphorus 5.5 (H) 2.5 - 4.6 mg/dL  °  Comment: Performed at [facility], [address] [place], [place] [address]  °Vitamin B12     Status: None  ° Collection Time: 12/06/21  5:07 AM  °Result Value Ref Range  ° Vitamin B-12 455 180 - 914 pg/mL  °  Comment: (NOTE) °This assay is not validated for testing neonatal or °myeloproliferative syndrome specimens for Vitamin B12 levels. °Performed at [facility], [address] °[place], [place] [address] °  °Folate     Status: None  ° Collection Time: 12/06/21  5:07 AM  °Result Value Ref Range  ° Folate  21.6 >5.9 ng/mL  °  Comment: Performed at [facility], [address] [place], [place] [address]  ° ° °CT HUMERUS RIGHT W CONTRAST ° °Result Date: 12/05/2021 °CLINICAL DATA:  Open wound.  Pain and swelling. EXAM: CT OF THE UPPER RIGHT EXTREMITY WITH CONTRAST TECHNIQUE: Multidetector CT imaging of the upper right extremity was performed according to the standard protocol following intravenous contrast administration. CONTRAST:  80mL OMNIPAQUE IOHEXOL 350 MG/ML SOLN COMPARISON:  CT scan 11/14/2021 FINDINGS: There is an open wound noted along the volar aspect of the right arm just below the antecubital fossa. Surgical clips are in place. Skin thickening and diffuse subcutaneous edema consistent with cellulitis. Overall, the cellulitis looks much improved when compared to the prior CT scan. New areas low attenuation in the medial arm musculature along the mid distal humerus level worrisome   for abscesses/pyomyositis. No findings suspicious for septic arthritis at the shoulder or elbow joints. No findings suspicious for osteomyelitis. IMPRESSION: 1. Open wound along the volar aspect of the right arm just below the antecubital fossa. 2. Diffuse cellulitis but improved when compared to prior study. 3. New areas low attenuation in the medial arm musculature along the mid distal humerus level worrisome for abscesses/pyomyositis. MRI may be helpful for further evaluation. 4. No findings suspicious for septic arthritis or osteomyelitis. Electronically Signed   By: Marijo Sanes M.D.   On: 12/05/2021 09:12    ROS Blood pressure (!) 141/77, pulse (!) 109, temperature 98.1 F (36.7 C), temperature source Oral, resp. rate 18, height 5\' 7"  (1.702 m), weight 60 kg, SpO2 99 %. Physical Exam General: No acute distress.  Sitting in chair, eating and drinking. Respiratory: No increased work of breathing.  Speaks in full sentences. MSK: Ace wrap over bilateral forearms.  Wound VAC right forearm.  Erythema and  induration over medial aspect right upper arm.  Associated tenderness to palpation.  No fluctuance or crepitus.  No tenderness elsewhere.  Capillary refill intact distally. Psychiatric: Normal mood. Neuro: Alert and oriented x3.  Assessment/Plan:  Spoke with Dr. Lenon Curt and at last wound VAC change there was good granular tissue that may be amenable to Integra/skin substitute or potentially STSG.  We will coordinate with our surgical team to that we can be present for next wound VAC change and assess possibility of graft placement.  Patient does complain of pain in the right humeral region that began a couple of days ago.  Physical exam is concerning for ascending infection and given CT findings will appreciate evaluation from orthopedics who hospitalist has already consulted.   Krista Blue 12/06/2021, 3:30 PM

## 2021-12-06 NOTE — Telephone Encounter (Signed)
Received call from Dr. Izora Ribas requesting urgent inpatient consult from Dr. Arita Miss or other available provider for closure coverage for wound of right upper extremity.   Patient at [facility], Dr. Izora Ribas would like someone to contact him at [phone]

## 2021-12-06 NOTE — Progress Notes (Signed)
Occupational Therapy Treatment Patient Details Name: Jason Buchanan MRN: [id number] DOB: 27-Apr-1986 Today's Date: 12/06/2021   History of present illness Pt is 36 yo male presented on 12/12 with bil UE edema/erythema after IV drug use.  Pt required R hand fasciotomy, carpal tunnel release, 3 compartment forearm fasciotomy, Guyon's canal release , and I and D hand/forearm on 12/15; and L wrist I and D, left volar forearm fasciotmoy, and L wrist carpal tunnel release on 12/13. On 12/14 concern for GIB and required 3 untis PRBC with ED 12/15.  Pt with hx of polysubstance abuse.   OT comments  Patients HEP was upgraded with patient at this time. Patient was noted to have improved ROM of LUE with better control over digits with patient able to pick up cups ect with no spillage. Patient reported being independent in room within restrictions of wound vac. Patient reported desire to keep LUE in neutral while on ice pack. Patient was educated on positioning strategies to implement that positioning. Patient demonstrated understanding. Patient's discharge plan remains appropriate at this time. OT will continue to follow acutely.     Recommendations for follow up therapy are one component of a multi-disciplinary discharge planning process, led by the attending physician.  Recommendations may be updated based on patient status, additional functional criteria and insurance authorization.    Follow Up Recommendations  Other (comment) (patient to transition to rehab program at time of d/c.)    Assistance Recommended at Discharge    Patient can return home with the following      Equipment Recommendations  None recommended by OT    Recommendations for Other Services      Precautions / Restrictions         Mobility Bed Mobility                    Transfers                         Balance                                           ADL either performed or  assessed with clinical judgement   ADL                                              Extremity/Trunk Assessment Upper Extremity Assessment RUE Deficits / Details: continues to have edmea in RUE with noted less redness on upper arm proximal to dressing on forearm remains firm. patient was again educated on proper positioning of UE. LUE Deficits / Details: patient is able to make fist pick up small cups and large cups in room with UE. patient has full ROM of shoulder and elbow unable to seqeeze ball with current bandages on            Vision       Perception     Praxis      Cognition                                                  Exercises  Other Exercises Other Exercises: patient participated in AROM of bilateral shoulders and elbows with education provided on proper positioning for exercises. patient verbalized understanding and demonstrated understanding. patient eduacted on L hand/digit exercises to promote extension of digits and maintain ROM. patient verbalized and demonstrated understanding. Other Exercises: patient reported attempting to lift ice packs like weights on L side. patient was educated on joint integrity of digits with holding ice packs and focus being on participation in ADLs. patient verbalized understanding. Other Exercises: patient noted to have reduced firmness in edmea in R hand digits 3-5 proximally. patient continues to report no feeling DIPs. patient paritcipated in AROM of digits as tolerated on R side.   Shoulder Instructions       General Comments      Pertinent Vitals/ Pain          Home Living                                          Prior Functioning/Environment              Frequency  Min 1X/week        Progress Toward Goals  OT Goals(current goals can now be found in the care plan section)  Progress towards OT goals: Progressing toward goals     Plan Frequency  needs to be updated;Discharge plan remains appropriate    Co-evaluation                 AM-PAC OT "6 Clicks" Daily Activity     Outcome Measure   Help from another person eating meals?: None Help from another person taking care of personal grooming?: None Help from another person toileting, which includes using toliet, bedpan, or urinal?: None Help from another person bathing (including washing, rinsing, drying)?: A Little Help from another person to put on and taking off regular upper body clothing?: A Little Help from another person to put on and taking off regular lower body clothing?: A Little 6 Click Score: 21    End of Session Equipment Utilized During Treatment: Other (comment) (red foam)  OT Visit Diagnosis: Muscle weakness (generalized) (M62.81);Pain   Activity Tolerance Patient tolerated treatment well   Patient Left in bed;with call bell/phone within reach;with bed alarm set   Nurse Communication Other (comment) (items in room and participation in session)        Time: 7902-4097 OT Time Calculation (min): 31 min  Charges: OT Treatments $Therapeutic Activity: 23-37 mins  Sharyn Blitz OTR/L, MS Acute Rehabilitation Department Office# [phone] Pager# [phone]   Ardyth Harps 12/06/2021, 4:29 PM

## 2021-12-06 NOTE — Telephone Encounter (Signed)
Contacted Dr. Izora Ribas and provided telephone number to discuss patient.

## 2021-12-06 NOTE — Progress Notes (Signed)
Events noted, Ct results reviewed.  Will plan on VAC change, ? I&D upper arm tomorrow afternoon.  Plastics consulted for coverage of forearm site.

## 2021-12-06 NOTE — Consult Note (Addendum)
Reason for Consult: Possible skin substitute or STSG volar aspect right forearm. Referring Physician: Dr. Theora Gianotti is an 36 y.o. male.  HPI: Patient is a 36 year old male with PMH significant for polysubstance abuse including IVDA who was admitted to the hospital 11/14/2021 for sepsis secondary to bilateral upper extremity cellulitis.  He was found to be bacteremic, cultures positive for strep pyogenes.  Wound cultures suggest polymicrobial infection. ID is evaluated patient and signed off, planning for daptomycin while inpatient and then 30 days of Zyvox upon discharge.  Hospitalization was complicated by acute blood loss anemia with hemoglobin down to 5.8 requiring 2 units PRBC and IV pantoprazole.  Positive melena, nonbleeding duodenal ulcers on upper endoscopy.  Dr. Lenon Curt and Dr. Greta Doom has collectively performed multiple I&D's since time of admission.  Most recent I&D performed 12/03/2021 with wound VAC application to right forearm and incisional VAC over the carpal tunnel incision.  Given persistent right upper extremity swelling, repeat CT was obtained 12/05/2021 which revealed improved cellulitis, but no areas of low-attenuation along mid distal humerus worrisome for abscesses versus pyomyositis.  Persistent wound noted along volar aspect of right arm just below antecubital fossa.  These findings were d/w orthopedics who reportedly plans to reevaluate.    Right-sided wound VAC was changed most recently on 12/01/2021.  At that time, there was good, beefy red granulation tissue noted.  Our group has been consulted for possible STSG or skin substitute placement.  Today on my examination, he tells me that he does feel as though the infection is ascending.  Complains of upper arm discomfort medially.  This is consistent with the new findings on his repeat CT.  He had an episode of nausea and emesis last night, but now controlled.  Denied hematemesis.  He tells me that his GI bleed was likely  due to him spending $15 per day on NSAIDs for his bilateral arm discomfort prior to coming to the ED for evaluation.  He reports that overall his pain has slowly begun to improve during his hospitalization and is going to try to transition to oral analgesics exclusively beginning after his next wound VAC change.  He tells me that he would be agreeable to skin substitute or STSG.  Patient plans to go to outpatient substance use center after he is ultimately discharged.   Past Medical History:  Diagnosis Date   Asthma    Mood disorder ([place])    Polysubstance abuse ([place])     Past Surgical History:  Procedure Laterality Date   ANKLE SURGERY     APPLICATION OF WOUND VAC Right 11/22/2021   Procedure: APPLICATION OF WOUND VAC;  Surgeon: Dayna Barker, MD;  [facility]: [facility];  Service: Plastics;  Laterality: Right;   APPLICATION OF WOUND VAC Right 11/25/2021   Procedure: APPLICATION OF WOUND VAC;  Surgeon: Dayna Barker, MD;  [facility];  Service: Plastics;  Laterality: Right;   BIOPSY  11/17/2021   Procedure: BIOPSY;  Surgeon: Clarene Essex, MD;  [facility];  Service: Endoscopy;;   BUBBLE STUDY  09/30/2021   Procedure: BUBBLE STUDY;  Surgeon: Dixie Dials, MD;  Location: [place];  Service: Cardiovascular;;   ESOPHAGOGASTRODUODENOSCOPY (EGD) WITH PROPOFOL N/A 11/17/2021   Procedure: ESOPHAGOGASTRODUODENOSCOPY (EGD) WITH PROPOFOL;  Surgeon: Clarene Essex, MD;  [facility];  Service: Endoscopy;  Laterality: N/A;   FRACTURE SURGERY     I & D EXTREMITY Right 09/27/2021   Procedure: IRRIGATION AND DEBRIDEMENT OF ABSCESS RIGHT HAND;  Surgeon:  Dayna Barker, MD;  Location: [place];  Service: Plastics;  Laterality: Right;   I & D EXTREMITY Bilateral 11/15/2021   Procedure: IRRIGATION AND DEBRIDEMENT EXTREMITY;  Surgeon: Dayna Barker, MD;  [facility];  Service: Plastics;  Laterality: Bilateral;   I & D EXTREMITY Right 11/17/2021   Procedure: IRRIGATION AND  DEBRIDEMENT EXTREMITY;  Surgeon: Dayna Barker, MD;  [facility];  Service: Plastics;  Laterality: Right;   I & D EXTREMITY Bilateral 12/01/2021   Procedure: INCISION AND DRAINAGE BILATERAL UPPER EXTREMITIES WITH  WOUND VAC CHANGE RIGHT;  Surgeon: Orene Desanctis, MD;  [facility];  Service: Orthopedics;  Laterality: Bilateral;   INCISION AND DRAINAGE OF WOUND Left 09/27/2021   Procedure: IRRIGATION AND DEBRIDEMENT OF ABSCESS LEFT INDEX FINGER;  Surgeon: Dayna Barker, MD;  Location: [place];  Service: Plastics;  Laterality: Left;   INCISION AND DRAINAGE OF WOUND Bilateral 11/22/2021   Procedure: IRRIGATION AND DEBRIDEMENT WOUND;  Surgeon: Dayna Barker, MD;  [facility];  Service: Plastics;  Laterality: Bilateral;   INCISION AND DRAINAGE OF WOUND Bilateral 11/25/2021   Procedure: BILATERAL IRRIGATION AND DEBRIDEMENT BILATERAL ARMS;  Surgeon: Dayna Barker, MD;  [facility];  Service: Plastics;  Laterality: Bilateral;   JOINT REPLACEMENT     SCLEROTHERAPY  11/17/2021   Procedure: SCLEROTHERAPY;  Surgeon: Clarene Essex, MD;  [facility];  Service: Endoscopy;;   TEE WITHOUT CARDIOVERSION N/A 09/30/2021   Procedure: TRANSESOPHAGEAL ECHOCARDIOGRAM (TEE);  Surgeon: Dixie Dials, MD;  Location: [facility];  Service: Cardiovascular;  Laterality: N/A;    History reviewed. No pertinent family history.  Social History:  reports that he has been smoking cigarettes. He has been smoking an average of .5 packs per day. He has never used smokeless tobacco. He reports that he does not currently use alcohol. He reports that he does not currently use drugs after having used the following drugs: IV.  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 Had prolonged block (>30 min) after succinylcholine for GI procedure.     Medications: I have  reviewed the patient's current medications.  Results for orders placed or performed during the hospital encounter of 11/14/21 (from the past 48 hour(s))  Hemoglobin and hematocrit, blood     Status: Abnormal   Collection Time: 12/04/21  4:25 PM  Result Value Ref Range   Hemoglobin 7.1 (L) 13.0 - 17.0 g/dL   HCT 23.2 (L) 39.0 - 52.0 %    Comment: Performed at [facility], [place] [address], [place], [place] [address]  Type and screen [place]     Status: None   Collection Time: 12/04/21  4:25 PM  Result Value Ref Range   ABO/RH(D) A POS    Antibody Screen NEG    Sample Expiration      12/07/2021,2359 Performed at [facility], [place] [address], [place], [place] [address]   CBC with Differential/Platelet     Status: Abnormal   Collection Time: 12/05/21  2:00 PM  Result Value Ref Range   WBC 10.4 4.0 - 10.5 K/uL   RBC 2.94 (L) 4.22 - 5.81 MIL/uL   Hemoglobin 7.8 (L) 13.0 - 17.0 g/dL   HCT 26.1 (L) 39.0 - 52.0 %   MCV 88.8 80.0 - 100.0 fL   MCH 26.5 26.0 - 34.0 pg   MCHC  29.9 (L) 30.0 - 36.0 g/dL   RDW 15.1 11.5 - 15.5 %   Platelets 521 (H) 150 - 400 K/uL   nRBC 0.0 0.0 - 0.2 %   Neutrophils Relative % 62 %   Neutro Abs 6.5 1.7 - 7.7 K/uL   Lymphocytes Relative 21 %   Lymphs Abs 2.2 0.7 - 4.0 K/uL   Monocytes Relative 8 %   Monocytes Absolute 0.8 0.1 - 1.0 K/uL   Eosinophils Relative 7 %   Eosinophils Absolute 0.7 (H) 0.0 - 0.5 K/uL   Basophils Relative 1 %   Basophils Absolute 0.1 0.0 - 0.1 K/uL   Immature Granulocytes 1 %   Abs Immature Granulocytes 0.08 (H) 0.00 - 0.07 K/uL    Comment: Performed at [facility], [place] [address], [place], [place] [address]  Comprehensive metabolic panel     Status: Abnormal   Collection Time: 12/05/21  2:00 PM  Result Value Ref Range   Sodium 132 (L) 135 - 145 mmol/L   Potassium 3.8 3.5 - 5.1 mmol/L   Chloride 102 98 - 111 mmol/L   CO2 25 22 - 32 mmol/L    Glucose, Bld 98 70 - 99 mg/dL    Comment: Glucose reference range applies only to samples taken after fasting for at least 8 hours.   BUN 14 6 - 20 mg/dL   Creatinine, Ser 0.46 (L) 0.61 - 1.24 mg/dL   Calcium 8.2 (L) 8.9 - 10.3 mg/dL   Total Protein 7.6 6.5 - 8.1 g/dL   Albumin 2.8 (L) 3.5 - 5.0 g/dL   AST 22 15 - 41 U/L   ALT 25 0 - 44 U/L   Alkaline Phosphatase 105 38 - 126 U/L   Total Bilirubin 0.4 0.3 - 1.2 mg/dL   GFR, Estimated >60 >60 mL/min    Comment: (NOTE) Calculated using the CKD-EPI Creatinine Equation (2021)    Anion gap 5 5 - 15    Comment: Performed at [facility], [place] [address] [place], [place] [address]  Magnesium     Status: None   Collection Time: 12/05/21  2:00 PM  Result Value Ref Range   Magnesium 1.7 1.7 - 2.4 mg/dL    Comment: Performed at [facility], [place] [address], [place], [place] [address]  Phosphorus     Status: None   Collection Time: 12/05/21  2:00 PM  Result Value Ref Range   Phosphorus 4.4 2.5 - 4.6 mg/dL    Comment: Performed at [facility], [place] [address] [place], [place] [address]  CBC with Differential/Platelet     Status: Abnormal   Collection Time: 12/06/21  5:07 AM  Result Value Ref Range   WBC 8.7 4.0 - 10.5 K/uL   RBC 2.83 (L) 4.22 - 5.81 MIL/uL   Hemoglobin 7.5 (L) 13.0 - 17.0 g/dL   HCT 25.2 (L) 39.0 - 52.0 %   MCV 89.0 80.0 - 100.0 fL   MCH 26.5 26.0 - 34.0 pg   MCHC 29.8 (L) 30.0 - 36.0 g/dL   RDW 15.1 11.5 - 15.5 %   Platelets 527 (H) 150 - 400 K/uL   nRBC 0.0 0.0 - 0.2 %   Neutrophils Relative % 52 %   Neutro Abs 4.6 1.7 - 7.7 K/uL   Lymphocytes Relative 28 %   Lymphs Abs 2.5 0.7 - 4.0 K/uL   Monocytes Relative 7 %   Monocytes Absolute 0.6 0.1 - 1.0 K/uL   Eosinophils  Relative 11 %   Eosinophils Absolute 0.9 (H) 0.0 - 0.5 K/uL   Basophils Relative 1 %   Basophils Absolute 0.1 0.0 - 0.1 K/uL   Immature Granulocytes 1 %   Abs Immature Granulocytes 0.04 0.00  - 0.07 K/uL    Comment: Performed at [facility], [address] [place], [place] [address]  Comprehensive metabolic panel     Status: Abnormal   Collection Time: 12/06/21  5:07 AM  Result Value Ref Range   Sodium 138 135 - 145 mmol/L   Potassium 3.7 3.5 - 5.1 mmol/L   Chloride 106 98 - 111 mmol/L   CO2 27 22 - 32 mmol/L   Glucose, Bld 104 (H) 70 - 99 mg/dL    Comment: Glucose reference range applies only to samples taken after fasting for at least 8 hours.   BUN 15 6 - 20 mg/dL   Creatinine, Ser 6.040.49 (L) 0.61 - 1.24 mg/dL   Calcium 8.4 (L) 8.9 - 10.3 mg/dL   Total Protein 6.9 6.5 - 8.1 g/dL   Albumin 2.4 (L) 3.5 - 5.0 g/dL   AST 21 15 - 41 U/L   ALT 22 0 - 44 U/L   Alkaline Phosphatase 100 38 - 126 U/L   Total Bilirubin 0.3 0.3 - 1.2 mg/dL   GFR, Estimated >54>60 >09>60 mL/min    Comment: (NOTE) Calculated using the CKD-EPI Creatinine Equation (2021)    Anion gap 5 5 - 15    Comment: Performed at [facility], [address] [place], [place] [address]  Magnesium     Status: None   Collection Time: 12/06/21  5:07 AM  Result Value Ref Range   Magnesium 1.8 1.7 - 2.4 mg/dL    Comment: Performed at [facility], [address] [place], [place] [address]  Phosphorus     Status: Abnormal   Collection Time: 12/06/21  5:07 AM  Result Value Ref Range   Phosphorus 5.5 (H) 2.5 - 4.6 mg/dL    Comment: Performed at [facility], [address] [place], [place] [address]  Vitamin B12     Status: None   Collection Time: 12/06/21  5:07 AM  Result Value Ref Range   Vitamin B-12 455 180 - 914 pg/mL    Comment: (NOTE) This assay is not validated for testing neonatal or myeloproliferative syndrome specimens for Vitamin B12 levels. Performed at [facility], [address] [place], [place] [address]   Folate     Status: None   Collection Time: 12/06/21  5:07 AM  Result Value Ref Range   Folate  21.6 >5.9 ng/mL    Comment: Performed at [facility], [address] [place], [place] [address]    CT HUMERUS RIGHT W CONTRAST  Result Date: 12/05/2021 CLINICAL DATA:  Open wound.  Pain and swelling. EXAM: CT OF THE UPPER RIGHT EXTREMITY WITH CONTRAST TECHNIQUE: Multidetector CT imaging of the upper right extremity was performed according to the standard protocol following intravenous contrast administration. CONTRAST:  80mL OMNIPAQUE IOHEXOL 350 MG/ML SOLN COMPARISON:  CT scan 11/14/2021 FINDINGS: There is an open wound noted along the volar aspect of the right arm just below the antecubital fossa. Surgical clips are in place. Skin thickening and diffuse subcutaneous edema consistent with cellulitis. Overall, the cellulitis looks much improved when compared to the prior CT scan. New areas low attenuation in the medial arm musculature along the mid distal humerus level worrisome  for abscesses/pyomyositis. No findings suspicious for septic arthritis at the shoulder or elbow joints. No findings suspicious for osteomyelitis. IMPRESSION: 1. Open wound along the volar aspect of the right arm just below the antecubital fossa. 2. Diffuse cellulitis but improved when compared to prior study. 3. New areas low attenuation in the medial arm musculature along the mid distal humerus level worrisome for abscesses/pyomyositis. MRI may be helpful for further evaluation. 4. No findings suspicious for septic arthritis or osteomyelitis. Electronically Signed   By: Marijo Sanes M.D.   On: 12/05/2021 09:12    ROS Blood pressure (!) 141/77, pulse (!) 109, temperature 98.1 F (36.7 C), temperature source Oral, resp. rate 18, height 5\' 7"  (1.702 m), weight 60 kg, SpO2 99 %. Physical Exam General: No acute distress.  Sitting in chair, eating and drinking. Respiratory: No increased work of breathing.  Speaks in full sentences. MSK: Ace wrap over bilateral forearms.  Wound VAC right forearm.  Erythema and  induration over medial aspect right upper arm.  Associated tenderness to palpation.  No fluctuance or crepitus.  No tenderness elsewhere.  Capillary refill intact distally. Psychiatric: Normal mood. Neuro: Alert and oriented x3.  Assessment/Plan:  Spoke with Dr. Lenon Curt and at last wound VAC change there was good granular tissue that may be amenable to Integra/skin substitute or potentially STSG.  We will coordinate with our surgical team to that we can be present for next wound VAC change and assess possibility of graft placement.  Patient does complain of pain in the right humeral region that began a couple of days ago.  Physical exam is concerning for ascending infection and given CT findings will appreciate evaluation from orthopedics who hospitalist has already consulted.   Krista Blue 12/06/2021, 3:30 PM

## 2021-12-07 ENCOUNTER — Encounter (HOSPITAL_COMMUNITY)
Admission: EM | Disposition: A | Discharge status: Home / Self Care | Payer: Self-pay | Source: Home / Self Care | Attending: Internal Medicine

## 2021-12-07 ENCOUNTER — Inpatient Hospital Stay (HOSPITAL_COMMUNITY): Payer: 59 | Admitting: Certified Registered Nurse Anesthetist

## 2021-12-07 DIAGNOSIS — L089 Local infection of the skin and subcutaneous tissue, unspecified: Secondary | ICD-10-CM

## 2021-12-07 HISTORY — PX: APPLICATION OF WOUND VAC: SHX5189

## 2021-12-07 HISTORY — PX: I & D EXTREMITY: SHX5045

## 2021-12-07 LAB — CBC WITH DIFFERENTIAL/PLATELET
Abs Immature Granulocytes: 0.04 10*3/uL (ref 0.00–0.07)
Basophils Absolute: 0.1 10*3/uL (ref 0.0–0.1)
Basophils Relative: 1 %
Eosinophils Absolute: 0.7 10*3/uL — ABNORMAL HIGH (ref 0.0–0.5)
Eosinophils Relative: 8 %
HCT: 24.2 % — ABNORMAL LOW (ref 39.0–52.0)
Hemoglobin: 7.4 g/dL — ABNORMAL LOW (ref 13.0–17.0)
Immature Granulocytes: 1 %
Lymphocytes Relative: 29 %
Lymphs Abs: 2.4 10*3/uL (ref 0.7–4.0)
MCH: 26.9 pg (ref 26.0–34.0)
MCHC: 30.6 g/dL (ref 30.0–36.0)
MCV: 88 fL (ref 80.0–100.0)
Monocytes Absolute: 0.6 10*3/uL (ref 0.1–1.0)
Monocytes Relative: 7 %
Neutro Abs: 4.6 10*3/uL (ref 1.7–7.7)
Neutrophils Relative %: 54 %
Platelets: 499 10*3/uL — ABNORMAL HIGH (ref 150–400)
RBC: 2.75 MIL/uL — ABNORMAL LOW (ref 4.22–5.81)
RDW: 15.2 % (ref 11.5–15.5)
WBC: 8.4 10*3/uL (ref 4.0–10.5)
nRBC: 0 % (ref 0.0–0.2)

## 2021-12-07 LAB — COMPREHENSIVE METABOLIC PANEL
ALT: 19 U/L (ref 0–44)
AST: 18 U/L (ref 15–41)
Albumin: 2.5 g/dL — ABNORMAL LOW (ref 3.5–5.0)
Alkaline Phosphatase: 89 U/L (ref 38–126)
Anion gap: 10 (ref 5–15)
BUN: 22 mg/dL — ABNORMAL HIGH (ref 6–20)
CO2: 22 mmol/L (ref 22–32)
Calcium: 8.5 mg/dL — ABNORMAL LOW (ref 8.9–10.3)
Chloride: 104 mmol/L (ref 98–111)
Creatinine, Ser: 0.44 mg/dL — ABNORMAL LOW (ref 0.61–1.24)
GFR, Estimated: >60 mL/min (ref >60)
Glucose, Bld: 121 mg/dL — ABNORMAL HIGH (ref 70–99)
Potassium: 4.1 mmol/L (ref 3.5–5.1)
Sodium: 136 mmol/L (ref 135–145)
Total Bilirubin: 0.3 mg/dL (ref 0.3–1.2)
Total Protein: 6.9 g/dL (ref 6.5–8.1)

## 2021-12-07 LAB — PHOSPHORUS: Phosphorus: 5.1 mg/dL — ABNORMAL HIGH (ref 2.5–4.6)

## 2021-12-07 LAB — MAGNESIUM: Magnesium: 1.6 mg/dL — ABNORMAL LOW (ref 1.7–2.4)

## 2021-12-07 SURGERY — APPLICATION, WOUND VAC
Anesthesia: General | Site: Arm Upper | Laterality: Right

## 2021-12-07 MED ORDER — 0.9 % SODIUM CHLORIDE (POUR BTL) OPTIME
TOPICAL | Status: DC | PRN
Start: 2021-12-07 — End: 2021-12-07
  Administered 2021-12-07: 1000 mL

## 2021-12-07 MED ORDER — PROPOFOL 10 MG/ML IV BOLUS
INTRAVENOUS | Status: AC
  Filled 2021-12-07: qty 20

## 2021-12-07 MED ORDER — DEXAMETHASONE SODIUM PHOSPHATE 10 MG/ML IJ SOLN
INTRAMUSCULAR | Status: AC
  Filled 2021-12-07: qty 1

## 2021-12-07 MED ORDER — CHLORHEXIDINE GLUCONATE 0.12 % MT SOLN
15.0000 mL | Freq: Once | OROMUCOSAL | Status: AC
  Administered 2021-12-07: 15 mL via OROMUCOSAL

## 2021-12-07 MED ORDER — KETAMINE HCL 10 MG/ML IJ SOLN
INTRAMUSCULAR | Status: DC | PRN
  Administered 2021-12-07: 40 mg via INTRAVENOUS

## 2021-12-07 MED ORDER — FENTANYL CITRATE (PF) 250 MCG/5ML IJ SOLN
INTRAMUSCULAR | Status: AC
  Filled 2021-12-07: qty 5

## 2021-12-07 MED ORDER — SODIUM CHLORIDE 0.9 % IR SOLN
Status: DC | PRN
  Administered 2021-12-07: 3000 mL

## 2021-12-07 MED ORDER — LACTATED RINGERS IV SOLN: INTRAVENOUS | Status: DC

## 2021-12-07 MED ORDER — DEXMEDETOMIDINE (PRECEDEX) IN NS 20 MCG/5ML (4 MCG/ML) IV SYRINGE
PREFILLED_SYRINGE | INTRAVENOUS | Status: DC | PRN
  Administered 2021-12-07: 8 ug via INTRAVENOUS
  Administered 2021-12-07: 12 ug via INTRAVENOUS

## 2021-12-07 MED ORDER — LIDOCAINE HCL (PF) 2 % IJ SOLN
INTRAMUSCULAR | Status: AC
  Filled 2021-12-07: qty 5

## 2021-12-07 MED ORDER — FENTANYL CITRATE (PF) 100 MCG/2ML IJ SOLN
INTRAMUSCULAR | Status: DC | PRN
  Administered 2021-12-07 (×3): 50 ug via INTRAVENOUS
  Administered 2021-12-07: 100 ug via INTRAVENOUS

## 2021-12-07 MED ORDER — ONDANSETRON HCL 4 MG/2ML IJ SOLN
INTRAMUSCULAR | Status: DC | PRN
  Administered 2021-12-07: 4 mg via INTRAVENOUS

## 2021-12-07 MED ORDER — KETAMINE HCL 10 MG/ML IJ SOLN
INTRAMUSCULAR | Status: AC
  Filled 2021-12-07: qty 1

## 2021-12-07 MED ORDER — PROMETHAZINE HCL 25 MG/ML IJ SOLN: 6.2500 mg | INTRAMUSCULAR | Status: DC | PRN

## 2021-12-07 MED ORDER — LIDOCAINE 2% (20 MG/ML) 5 ML SYRINGE
INTRAMUSCULAR | Status: DC | PRN
Start: 2021-12-07 — End: 2021-12-07
  Administered 2021-12-07: 100 mg via INTRAVENOUS

## 2021-12-07 MED ORDER — DEXAMETHASONE SODIUM PHOSPHATE 10 MG/ML IJ SOLN
INTRAMUSCULAR | Status: DC | PRN
Start: 2021-12-07 — End: 2021-12-07
  Administered 2021-12-07: 4 mg via INTRAVENOUS

## 2021-12-07 MED ORDER — ONDANSETRON HCL 4 MG/2ML IJ SOLN
INTRAMUSCULAR | Status: AC
  Filled 2021-12-07: qty 2

## 2021-12-07 MED ORDER — ACETAMINOPHEN 10 MG/ML IV SOLN: 1000.0000 mg | Freq: Once | INTRAVENOUS | Status: DC | PRN

## 2021-12-07 MED ORDER — MIDAZOLAM HCL 5 MG/5ML IJ SOLN
INTRAMUSCULAR | Status: DC | PRN
Start: 2021-12-07 — End: 2021-12-07
  Administered 2021-12-07: 2 mg via INTRAVENOUS

## 2021-12-07 MED ORDER — FENTANYL CITRATE PF 50 MCG/ML IJ SOSY: 25.0000 ug | PREFILLED_SYRINGE | INTRAMUSCULAR | Status: DC | PRN

## 2021-12-07 MED ORDER — MAGNESIUM SULFATE 2 GM/50ML IV SOLN
2.0000 g | Freq: Once | INTRAVENOUS | Status: AC
  Administered 2021-12-07: 2 g via INTRAVENOUS
  Filled 2021-12-07: qty 50

## 2021-12-07 MED ORDER — PROPOFOL 10 MG/ML IV BOLUS
INTRAVENOUS | Status: DC | PRN
  Administered 2021-12-07: 200 mg via INTRAVENOUS

## 2021-12-07 MED ORDER — MIDAZOLAM HCL 2 MG/2ML IJ SOLN
INTRAMUSCULAR | Status: AC
  Filled 2021-12-07: qty 2

## 2021-12-07 SURGICAL SUPPLY — 36 items
BAG COUNTER SPONGE SURGICOUNT (BAG) IMPLANT
BAG ZIPLOCK 12X15 (MISCELLANEOUS) ×3 IMPLANT
BNDG ELASTIC 6X15 VLCR STRL LF (GAUZE/BANDAGES/DRESSINGS) ×1 IMPLANT
BNDG GAUZE ELAST 4 BULKY (GAUZE/BANDAGES/DRESSINGS) ×3 IMPLANT
CANISTER WOUNDNEG PRESSURE 500 (CANNISTER) ×1 IMPLANT
CORD BIPOLAR FORCEPS 12FT (ELECTRODE) ×3 IMPLANT
COVER SURGICAL LIGHT HANDLE (MISCELLANEOUS) ×3 IMPLANT
CUFF TOURN SGL QUICK 18X4 (TOURNIQUET CUFF) ×3 IMPLANT
CUFF TOURN SGL QUICK 24 (TOURNIQUET CUFF)
CUFF TRNQT CYL 24X4X16.5-23 (TOURNIQUET CUFF) IMPLANT
DRAIN PENROSE 0.5X18 (DRAIN) IMPLANT
DRAPE SURG 17X11 SM STRL (DRAPES) ×6 IMPLANT
DRSG VAC ATS MED SENSATRAC (GAUZE/BANDAGES/DRESSINGS) ×1 IMPLANT
DRSG VERSA FOAM LRG 10X15 (GAUZE/BANDAGES/DRESSINGS) ×1 IMPLANT
ELECT REM PT RETURN 15FT ADLT (MISCELLANEOUS) ×3 IMPLANT
GAUZE SPONGE 4X4 12PLY STRL (GAUZE/BANDAGES/DRESSINGS) ×2 IMPLANT
GLOVE SURG ENC TEXT LTX SZ8 (GLOVE) ×6 IMPLANT
GLOVE SURG UNDER POLY LF SZ6.5 (GLOVE) ×3 IMPLANT
GLOVE SURG UNDER POLY LF SZ7 (GLOVE) ×3 IMPLANT
GLOVE SURG UNDER POLY LF SZ7.5 (GLOVE) ×3 IMPLANT
HANDPIECE INTERPULSE COAX TIP (DISPOSABLE) ×3
KIT BASIN OR (CUSTOM PROCEDURE TRAY) ×3 IMPLANT
KIT TURNOVER KIT A (KITS) IMPLANT
NEEDLE HYPO 22GX1.5 SAFETY (NEEDLE) ×3 IMPLANT
NS IRRIG 1000ML POUR BTL (IV SOLUTION) ×3 IMPLANT
PACK ORTHO EXTREMITY (CUSTOM PROCEDURE TRAY) ×3 IMPLANT
PENCIL SMOKE EVACUATOR (MISCELLANEOUS) IMPLANT
PROTECTOR NERVE ULNAR (MISCELLANEOUS) ×3 IMPLANT
SET HNDPC FAN SPRY TIP SCT (DISPOSABLE) ×2 IMPLANT
STOCKINETTE 6  STRL (DRAPES) ×3
STOCKINETTE 6 STRL (DRAPES) ×2 IMPLANT
SUT ETHILON 3 0 PS 1 (SUTURE) ×4 IMPLANT
SUT ETHILON 4 0 PS 2 18 (SUTURE) ×5 IMPLANT
SWAB COLLECTION DEVICE MRSA (MISCELLANEOUS) IMPLANT
SWAB CULTURE ESWAB REG 1ML (MISCELLANEOUS) IMPLANT
SYR CONTROL 10ML LL (SYRINGE) ×3 IMPLANT

## 2021-12-07 NOTE — Progress Notes (Signed)
PROGRESS NOTE   Jason Buchanan  [id number]    DOB: 05/21/1986    DOA: 11/14/2021  PCP: [name], NP   I have briefly reviewed patients previous medical [facility] in [facility].  Chief complaint: Bilateral upper extremity pain   Brief Narrative:  36 year old male with medical history significant for polysubstance abuse including IVDA presented with bilateral arm pain.  He has history of injecting multiple drugs in both arms.  CT of upper extremity showed extensive cellulitis and he was initiated on broad-spectrum antibiotic therapy.  Since then patient has undergone multiple bilateral upper extremity I&D's by hand surgery and plastic surgery.  Blood cultures also positive for Streptococcus group B.  On 12/5, he developed melena with acute blood loss anemia requiring PRBC transfusion, GI evaluated by EGD confirming mild proximal mid esophagitis, nonbleeding gastric ulcers with no stigmata of bleeding, positive adherent clot, nonbleeding duodenal ulcers with adherent clot, ulcers were injected.  Postprocedure he developed respiratory failure requiring intubation and mechanical ventilation.  Extubated 12/15.  Care transferred to [facility] post medical stabilization.  Hand surgery planning to take back to the OR on 1/4.   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[place]) Active Problems:   Sepsis due to cellulitis (H[facility])   Polysubstance abuse ([place])   Hypokalemia   IVDU (intravenous drug user)   Streptococcal bacteremia   Right arm cellulitis   Staphylococcus aureus infection   Acute blood loss anemia   Upper GI bleed   Respiratory failure, post-operative (HCC)   Hepatitis C 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   Protein-calorie malnutrition, severe   Bacterial skin infection of upper extremity   Electrolyte abnormality   Encounter for central line care   Bilateral upper extremity cellulitis  12/12 with CT's showing  diffuse soft tissue swelling of R arm  S/p multiple I&D's - 12/13, 12/15, 12/16, 12/21, and 12/29 Continued RUE swelling , CT of right humerus 12/05/2021> with new areas low attenuation in the medial arm musculature along the mid distal humerus level concerning for abscesses/pyomyositis Prior TRH MD discussed with ortho, no plan for MRI at this time. Plan for OR 1/4 afternoon along with plastic surgery. Plastics following as well ID signed off, planning for daptomycin while inpatient, 30 days zyvox when ready for discharge (may need to reassess closer to discharge depending on how long he's here - 11/23/2021 was their sign off note) cx with strep pyogenes and MRSA Multimodality pain control. Patient also reports some pain and swelling of left fingers which is relatively new.  Requested surgery to assess.   Streptococcal bacteremia Strep pyogenes from 11/14/2021 blood cx 12/14 cx NGTD Appreciate ID recs as above, daptomycin, then linezolid x30 days at discharge Echo 11/16/2021 with EF 55-60%, RVSF normal, abnormal MV (unchanged thickening from prior), AV thickening greater than expected for age, but stable from prior (mild to mod AV regurg) Per ID note, 12/16, GAS not typcial cause or concern for endocarditis, no TEE planned   Upper GI bleed 12/15 with mild proximal mid esophagitis, non bleeding gastric ulcers, non bleeding duodenal ulcers Per GI, if recurrent may need repeat endoscopy Path negative for h pyloir, antral and oxyntic mucosa with hyperemia PPI BID Avoid NSAIDs - using multiple prior to admission   Acute blood loss anemia Transfuse as needed S/p 4 units pRBC Labs c/w iron def anemia Follow b12, folate (wnl) Hemoglobin stable in the low 7 g range.  Follow CBC closely and transfuse if  hemoglobin 7 g or less.   IVDU (intravenous drug user)- (present on admission) He's interested in [place] after discharge  Amphetamine and psychostimulant-induced psychosis with hallucinations  ([place]) Seroquel, trazodone Seen by psych 12/17, no recommendation for inpatient psych   Electrolyte abnormality Replace and follow as needed.  Magnesium low at 1.6 today, replacing.   Hepatitis C 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 Follow outpatient with ID.    Encounter for central line care In place since 11/17/2021 Has PIV at this time Central line discontinued on 1/3.   Respiratory failure, post-operative ([place]) Extubated 12/15  Body mass index is 20.72 kg/m.  Nutritional Status Nutrition Problem: Severe Malnutrition Etiology: acute illness, wound healing Signs/Symptoms: severe fat depletion, severe muscle depletion Interventions: Prostat, MVI, Ensure Enlive (each supplement provides 350kcal and 20 grams of protein)    DVT prophylaxis: Place and maintain sequential compression device Start: 12/04/21 1856 SCDs Start: 11/14/21 1835     Code Status: Full Code Family Communication: None at bedside Disposition:  Status is: Inpatient  Remains inpatient appropriate because: Severity of illness and need for procedures.      Consultants:   Hand ID GI PCCM psych  Procedures:   See prior notes, several I&D's, orthopedic procedures by hand EGD with GI  Antimicrobials:   IV daptomycin   Subjective:  Patient reports that he is doing relatively well.  Ambulated the halls couple times yesterday.  Pain reasonably controlled.  Reports some new onset of pain and swelling in his left distal fingers which is relatively new.  Also reports pain on the medial aspect of the right upper arm.  Objective:   Vitals:   12/06/21 1522 12/06/21 1930 12/07/21 0341 12/07/21 0400  BP: (!) 141/77 133/85 (!) 183/100 (!) 131/97  Pulse: (!) 109 100 100   Resp: 18 20 20    Temp: 98.1 F (36.7 C) 98.2 F (36.8 C) 98.4 F (36.9 C)   TempSrc: Oral Oral Oral   SpO2: 99% 100% 99%   Weight:      Height:        General exam:, Moderately built and poorly nourished lying comfortably propped up in bed  without distress. Respiratory system: Clear to auscultation. Respiratory effort normal. Cardiovascular system: S1 & S2 heard, RRR. No JVD, murmurs, rubs, gallops or clicks. No pedal edema.  Not on telemetry. Gastrointestinal system: Abdomen is nondistended, soft and nontender. No organomegaly or masses felt. Normal bowel sounds heard. Central nervous system: Alert and oriented. No focal neurological deficits. Extremities: Symmetric 5 x 5 power.  Dressing over right forearm and left wrist.  Distal left fingers mildly swollen but able to move without difficulty and has good capillary refill.  Area of lower medial aspect of right upper arm with induration, tenderness and minimal erythema. Skin: No rashes, lesions or ulcers Psychiatry: Judgement and insight appear normal. Mood & affect appropriate.     Data Reviewed:   I have personally reviewed following labs and imaging studies   CBC: Recent Labs  Lab 12/05/21 1400 12/06/21 0507 12/07/21 0519  WBC 10.4 8.7 8.4  NEUTROABS 6.5 4.6 4.6  HGB 7.8* 7.5* 7.4*  HCT 26.1* 25.2* 24.2*  MCV 88.8 89.0 88.0  PLT 521* 527* 499*    Basic Metabolic Panel: Recent Labs  Lab 12/02/21 0442 12/05/21 1400 12/06/21 0507 12/07/21 0519  NA 134* 132* 138 136  K 4.1 3.8 3.7 4.1  CL 102 102 106 104  CO2 27 25 27 22   GLUCOSE 159* 98 104* 121*  BUN 21* 14  15 22*  CREATININE 0.44* 0.46* 0.49* 0.44*  CALCIUM 8.4* 8.2* 8.4* 8.5*  MG  --  1.7 1.8 1.6*  PHOS  --  4.4 5.5* 5.1*    Liver Function Tests: Recent Labs  Lab 12/05/21 1400 12/06/21 0507 12/07/21 0519  AST 22 21 18   ALT 25 22 19   ALKPHOS 105 100 89  BILITOT 0.4 0.3 0.3  PROT 7.6 6.9 6.9  ALBUMIN 2.8* 2.4* 2.5*    CBG: No results for input(s): GLUCAP in the last 168 hours.  Microbiology Studies:  No results found for this or any previous visit (from the past 240 hour(s)).  Radiology Studies:  No results found.  Scheduled Meds:    (feeding supplement) PROSource Plus  30 mL  Oral TID BM   acetaminophen  1,000 mg Oral Q8H   vitamin C  500 mg Oral Daily   Chlorhexidine Gluconate Cloth  6 each Topical Daily   feeding supplement  237 mL Oral BID BM   ferrous sulfate  325 mg Oral Q breakfast   gabapentin  300 mg Oral QHS   ketorolac  15 mg Intravenous Q6H   nicotine  21 mg Transdermal Daily   pantoprazole  40 mg Oral BID   polyethylene glycol  17 g Oral BID   QUEtiapine  100 mg Oral BID   senna-docusate  2 tablet Oral QHS    Continuous Infusions:    DAPTOmycin (CUBICIN)  IV Stopped (12/06/21 2330)     LOS: 23 days     Vernell Leep, MD,  FACP, [facility], [facility], [facility] (Care Management Physician Certified) [place]  To contact the attending provider between 7A-7P or the covering provider during after hours 7P-7A, please log into the web site [email or web address] and access using universal  password for that web site. If you do not have the password, please call the hospital operator.  12/07/2021, 10:12 AM

## 2021-12-07 NOTE — TOC Progression Note (Addendum)
Transition of Care [facility]) - Progression Note    Patient Details  Name: Jason Buchanan MRN: [id number] Date of Birth: [birth year]  Transition of Care [facility]) CM/SW Contact  Ida Rogue, [place] Phone Number: 12/07/2021, 9:29 AM  Clinical Narrative:   Patient seen in follow up to dispositional plan.  Mr Klatt states he continues to be invested in long term SA rehab, and has been in touch with [facility] in [place].  He has a follow-up phone interview on Thursday.  He knows that going there with wounds that require attention makes this plan a long shot, but he remains focused on getting help for his addiction.  Beyond that, he acknowledged that short term rehab is not an option given his insurance coverage, or rather, lack there-of, and is asking for help with getting into a shelter if no other resources are available.  He reminded me that he had been living in a tent for 3 months prior to admission because he was unable to secure any other kind of shelter. Finally, he is focused on applying for disability.  I will contact [facility] given homeless status to see if they are able to help him with this process. TOC will continue to follow during the course of hospitalization.  Addendum:  After consulting with Financial Navigator, left message for director of disability services at [facility] with referral information.     Expected Discharge Plan:  (currently unknown) Barriers to Discharge: Continued Medical Work up  Expected Discharge Plan and Services Expected Discharge Plan:  (currently unknown)   Discharge Planning Services: CM Consult   Living arrangements for the past 2 months: Homeless Shelter Expected Discharge Date: 11/14/21                                     Social Determinants of Health (SDOH) Interventions    Readmission Risk Interventions No flowsheet data found.

## 2021-12-07 NOTE — Anesthesia Procedure Notes (Signed)
Procedure Name: LMA Insertion Date/Time: 12/07/2021 4:25 PM Performed by: Epimenio Sarin, CRNA Pre-anesthesia Checklist: Patient identified, Emergency Drugs available, Suction available, Patient being monitored and Timeout performed Patient Re-evaluated:Patient Re-evaluated prior to induction Oxygen Delivery Method: Circle system utilized Preoxygenation: Pre-oxygenation with 100% oxygen Induction Type: IV induction Ventilation: Mask ventilation without difficulty LMA: LMA with gastric port inserted LMA Size: 4.0 Number of attempts: 1 Dental Injury: Teeth and Oropharynx as per pre-operative assessment

## 2021-12-07 NOTE — Progress Notes (Signed)
Spoke w/ Dr. Lenon Curt pt is ok to have sips w/ morning medications. Made Dr. Lenon Curt and Dillard Cannon, PA aware that patient that digits on left hand are more swollen than 12/07/2021. Hand is elevated and ice is applied since 0300.

## 2021-12-07 NOTE — Progress Notes (Signed)
Pt is showing yellow MEWS due to an elevated HR of 116 and an elevated BP of 164/91. No changes to mentation status. Yellow MEWS protocol initiated. MD notified.

## 2021-12-07 NOTE — Anesthesia Postprocedure Evaluation (Signed)
Anesthesia Post Note  Patient: Jason Buchanan  Procedure(s) Performed: APPLICATION OF WOUND VAC (Right: Arm Lower) IRRIGATION AND DEBRIDEMENT EXTREMITY, MANIPULATION OF FINGERS UNDER ANESTHESIA (Right: Arm Upper)     Patient location during evaluation: PACU Anesthesia Type: General Level of consciousness: awake and alert Pain management: pain level controlled Vital Signs Assessment: post-procedure vital signs reviewed and stable Respiratory status: spontaneous breathing, nonlabored ventilation, respiratory function stable and patient connected to nasal cannula oxygen Cardiovascular status: blood pressure returned to baseline and stable Postop Assessment: no apparent nausea or vomiting Anesthetic complications: no   No notable events documented.  Last Vitals:  Vitals:   12/07/21 1745 12/07/21 1800  BP: (!) 145/70 (!) 134/91  Pulse: (!) 107 (!) 109  Resp: 12 18  Temp:  36.7 C  SpO2: 100% 94%    Last Pain:  Vitals:   12/07/21 1800  TempSrc:   PainSc: Asleep                 Trevor Iha

## 2021-12-07 NOTE — Progress Notes (Signed)
Nutrition Follow-up  DOCUMENTATION CODES:   Severe malnutrition in context of acute illness/injury  INTERVENTION:   -Recommend updated weight   -Ensure Enlive po BID, each supplement provides 350 kcal and 20 grams of protein    -Multivitamin with minerals daily   -500 mg Vitamin daily   -Prosource Plus PO TID, each provides 100 kcals and 15g protein  NUTRITION DIAGNOSIS:   Severe Malnutrition related to acute illness, wound healing as evidenced by severe fat depletion, severe muscle depletion.  Ongoing.  GOAL:   Patient will meet greater than or equal to 90% of their needs  Progressing.  MONITOR:   PO intake, Supplement acceptance, Labs, Weight trends, I & O's, Skin  ASSESSMENT:   36 year old man with history of Polysubstance abuse who presented to the hospital with arm swelling, erythema and pain. Patient is a poor historian but  reports history of injecting multiple drugs in both arms and currently receiving treatment for severe sepsis of right upper extremities complicated with streptococcus bacteremia.  12/12 admitted, NPO ->HH diet 12/13: NPO, s/p I&D of left wrist deep abscess, left volar forearm fasciotomy, left wrist carpal tunnel release 12/14: Regular ->NPO 12/15: s/p right hand fasciotomy, right hand carpal tunnel release, right forearm fasciotomy, I&D of hand and forearm, s/p EGD -esophagitis, gastric and duodenal ulcers 12/17: regular diet 12/20: I&D of BUEs 12/23: I&D of bilateral arms 12/29: I&D of bilateral arms w/ Wound VAC  Patient currently NPO for further I&D of BUEs and Wound VAC change.  Pt consumes 100% of his meals when on a diet and accepts his protein supplements.  Admission weight: 132 lbs. No new weights recorded.  Medications: Vitamin C, Ferrous sulfate, Miralax, Senokot, IV Mg sulfate  Labs reviewed: Elevated Phos (5.1) Low Mg   Diet Order:   Diet Order             Diet NPO time specified  Diet effective ____                    EDUCATION NEEDS:   Education needs have been addressed  Skin:  Skin Assessment: Skin Integrity Issues: Skin Integrity Issues:: Incisions Incisions: 12/13: bilateral arms  12/15: right arm 12/20 & 12/23 bilateral arms, 12/29 left arm  Last BM:  1/2 -type 1  Height:   Ht Readings from Last 1 Encounters:  11/25/21 5\' 7"  (1.702 m)    Weight:   Wt Readings from Last 1 Encounters:  11/25/21 60 kg    BMI:  Body mass index is 20.72 kg/m.  Estimated Nutritional Needs:   Kcal:  1800-2000  Protein:  95-110g  Fluid:  2L/day   11/27/21, MS, RD, LDN Inpatient Clinical Dietitian Contact information available via Amion

## 2021-12-07 NOTE — Anesthesia Preprocedure Evaluation (Addendum)
Anesthesia Evaluation  Patient identified by MRN, date of birth, ID band Patient awake    Reviewed: Allergy & Precautions, NPO status , Patient's Chart, lab work & pertinent test results  Airway Mallampati: II  TM Distance: >3 FB Neck ROM: Full    Dental no notable dental hx. (+) Teeth Intact, Dental Advisory Given   Pulmonary asthma , Current Smoker and Patient abstained from smoking.,    Pulmonary exam normal breath sounds clear to auscultation       Cardiovascular Exercise Tolerance: Good negative cardio ROS Normal cardiovascular exam Rhythm:Regular Rate:Normal     Neuro/Psych negative neurological ROS  negative psych ROS   GI/Hepatic negative GI ROS, (+)     substance abuse  ,   Endo/Other  negative endocrine ROS  Renal/GU negative Renal ROS  negative genitourinary   Musculoskeletal Open wound right arm   Abdominal   Peds  Hematology  (+) anemia ,   Anesthesia Other Findings   Reproductive/Obstetrics                           Anesthesia Physical Anesthesia Plan  ASA: 2  Anesthesia Plan: General   Post-op Pain Management:    Induction: Intravenous  PONV Risk Score and Plan: 1 and Ondansetron, Dexamethasone, Midazolam and Treatment may vary due to age or medical condition  Airway Management Planned: LMA  Additional Equipment: None  Intra-op Plan:   Post-operative Plan:   Informed Consent: I have reviewed the patients History and Physical, chart, labs and discussed the procedure including the risks, benefits and alternatives for the proposed anesthesia with the patient or authorized representative who has indicated his/her understanding and acceptance.     Dental advisory given  Plan Discussed with: CRNA and Anesthesiologist  Anesthesia Plan Comments: (Lab Results      Component                Value               Date                      WBC                      8.4                  12/07/2021                HGB                      7.4 (L)             12/07/2021                HCT                      24.2 (L)            12/07/2021                MCV                      88.0                12/07/2021                PLT  499 (H)             12/07/2021           Lab Results      Component                Value               Date                      NA                       136                 12/07/2021                K                        4.1                 12/07/2021                CO2                      22                  12/07/2021                GLUCOSE                  121 (H)             12/07/2021                BUN                      22 (H)              12/07/2021                CREATININE               0.44 (L)            12/07/2021                CALCIUM                  8.5 (L)             12/07/2021                GFRNONAA                 >60                 12/07/2021          )       Anesthesia Quick Evaluation

## 2021-12-07 NOTE — Transfer of Care (Signed)
Immediate Anesthesia Transfer of Care Note  Patient: Jason Buchanan  Procedure(s) Performed: APPLICATION OF WOUND VAC (Right: Arm Lower) IRRIGATION AND DEBRIDEMENT EXTREMITY, MANIPULATION OF FINGERS UNDER ANESTHESIA (Right: Arm Upper)  Patient Location: PACU  Anesthesia Type:General  Level of Consciousness: drowsy and patient cooperative  Airway & Oxygen Therapy: Patient Spontanous Breathing and Patient connected to face mask oxygen  Post-op Assessment: Report given to RN and Post -op Vital signs reviewed and stable  Post vital signs: Reviewed and stable  Last Vitals:  Vitals Value Taken Time  BP 135/78 12/07/21 1730  Temp    Pulse 109 12/07/21 1731  Resp 18 12/07/21 1731  SpO2 100 % 12/07/21 1731  Vitals shown include unvalidated device data.  Last Pain:  Vitals:   12/07/21 1510  TempSrc:   PainSc: 8       Patients Stated Pain Goal: 2 (12/07/21 1440)  Complications: No notable events documented.

## 2021-12-08 ENCOUNTER — Encounter (HOSPITAL_COMMUNITY): Payer: Self-pay | Admitting: General Surgery

## 2021-12-08 DIAGNOSIS — L03119 Cellulitis of unspecified part of limb: Secondary | ICD-10-CM

## 2021-12-08 DIAGNOSIS — L02419 Cutaneous abscess of limb, unspecified: Secondary | ICD-10-CM | POA: Diagnosis not present

## 2021-12-08 LAB — BASIC METABOLIC PANEL
Anion gap: 12 (ref 5–15)
BUN: 16 mg/dL (ref 6–20)
CO2: 25 mmol/L (ref 22–32)
Calcium: 8.5 mg/dL — ABNORMAL LOW (ref 8.9–10.3)
Chloride: 100 mmol/L (ref 98–111)
Creatinine, Ser: 0.45 mg/dL — ABNORMAL LOW (ref 0.61–1.24)
GFR, Estimated: >60 mL/min (ref >60)
Glucose, Bld: 116 mg/dL — ABNORMAL HIGH (ref 70–99)
Potassium: 4.1 mmol/L (ref 3.5–5.1)
Sodium: 137 mmol/L (ref 135–145)

## 2021-12-08 LAB — PHOSPHORUS: Phosphorus: 4.7 mg/dL — ABNORMAL HIGH (ref 2.5–4.6)

## 2021-12-08 LAB — CBC
HCT: 24.3 % — ABNORMAL LOW (ref 39.0–52.0)
Hemoglobin: 7.2 g/dL — ABNORMAL LOW (ref 13.0–17.0)
MCH: 26.1 pg (ref 26.0–34.0)
MCHC: 29.6 g/dL — ABNORMAL LOW (ref 30.0–36.0)
MCV: 88 fL (ref 80.0–100.0)
Platelets: 637 10*3/uL — ABNORMAL HIGH (ref 150–400)
RBC: 2.76 MIL/uL — ABNORMAL LOW (ref 4.22–5.81)
RDW: 15.3 % (ref 11.5–15.5)
WBC: 11.5 10*3/uL — ABNORMAL HIGH (ref 4.0–10.5)
nRBC: 0 % (ref 0.0–0.2)

## 2021-12-08 LAB — PREPARE RBC (CROSSMATCH)

## 2021-12-08 LAB — MAGNESIUM: Magnesium: 1.9 mg/dL (ref 1.7–2.4)

## 2021-12-08 MED ORDER — SODIUM CHLORIDE 0.9% IV SOLUTION: Freq: Once | INTRAVENOUS | Status: AC

## 2021-12-08 NOTE — Plan of Care (Signed)

## 2021-12-08 NOTE — Plan of Care (Signed)
  Problem: Education: Goal: Knowledge of General Education information will improve Description Including pain rating scale, medication(s)/side effects and non-pharmacologic comfort measures Outcome: Progressing   

## 2021-12-08 NOTE — Op Note (Signed)
NAME: Jason Buchanan, Jason Buchanan MEDICAL RECORD NO: [id number] ACCOUNT NO: [id number] DATE OF BIRTH: 11/04/1986 FACILITY: [facility] LOCATION: [id number] PHYSICIAN: Joenathan Sakuma C. Izora Ribas, MD  Operative Report   DATE OF PROCEDURE: 12/07/2021  PREOPERATIVE DIAGNOSIS:  Complex infection and open wound of the right upper extremity.  POSTOPERATIVE DIAGNOSIS:  Complex infection and open wound of the right upper extremity.  PROCEDURES:   1.  Wound VAC change of the right upper extremity. 2.  Manipulation of all fingers under anesthesia. 3.  Incision and drainage of deep abscess of the right upper arm.  INDICATIONS:  The patient is a 36 year old male, long history of severe infection of the right upper extremity.  He has been washed out several times, it has resulted in an open wound and VAC on the forearm area. There is concern for new abscess of the  right upper extremity.  Risks, benefits, alternatives of surgery were discussed with him.  He agreed with this course of action.  Consent was obtained.  DESCRIPTION OF PROCEDURE:  The patient was taken to the operating room and placed supine on the operating room table.  Anesthesia was administered.  The right upper extremity was prepped and draped in normal sterile fashion.  A tourniquet was placed on  the right upper extremity way high into the axilla.  Next, the old VAC dressing was removed.  There was nice beefy red tissue without any evidence of infection.  There were flexor tendons that were exposed.  Each of the digits, index, long, ring and  small were stretched vigorously to obtain as much extension as possible while under anesthesia.  Next, there was an indurated area by physical examination and a possible abscess by CT scan of the right upper extremity along the aspect of medial border of  the biceps.  An incision was made over this area of induration.  Dissection was carried down through the skin and subcutaneous tissue.  There was quite a bit of indurated what  appeared to be old pus and fibrous type tissue in the subcutaneous plane.   This was sharply debrided with scissors, with a sponge and with a curette.  This nonviable tissue extended down onto the muscle fascia itself.  This was also scraped with a curette.  The wound tracked cephalad just a bit as well as distally just shy of  the antecubital fossa.  Thorough irrigation of the space was then performed with 3 liters of solution while debriding with a sponge and the curette all of the tissues.  This all appeared to be subacute rather than an acute pus pocket.  Afterwards, this  wound was packed with Kerlix gauze while a white and dark sponge were fashioned over the wound and a VAC secured on the forearm.  Suction was hooked up.  There was a good seal.  The patient tolerated the procedure well and was taken to recovery room  stable.   VAI D: 12/07/2021 5:27:27 pm T: 12/08/2021 12:06:00 am  JOB: [id number]/ [id number]

## 2021-12-08 NOTE — Progress Notes (Signed)
PROGRESS NOTE   Jason Buchanan  [id number]    DOB: [birth year]    DOA: 11/14/2021  PCP: [name], NP   I have briefly reviewed patients previous medical [facility] in [facility].  Chief complaint: Bilateral upper extremity pain   Brief Narrative:  36 year old male with medical history significant for polysubstance abuse including IVDA presented with bilateral arm pain.  He has history of injecting multiple drugs in both arms.  CT of upper extremity showed extensive cellulitis and he was initiated on broad-spectrum antibiotic therapy.  Since then patient has undergone multiple bilateral upper extremity I&D's by hand surgery and plastic surgery.  Blood cultures also positive for Streptococcus group B.  On 12/5, he developed melena with acute blood loss anemia requiring PRBC transfusion, GI evaluated by EGD confirming mild proximal mid esophagitis, nonbleeding gastric ulcers with no stigmata of bleeding, positive adherent clot, nonbleeding duodenal ulcers with adherent clot, ulcers were injected.  Postprocedure he developed respiratory failure requiring intubation and mechanical ventilation.  Extubated 12/15.  Care transferred to [facility] post medical stabilization.  Hand surgery planning to take back to the OR on 1/4.   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[place]) Active Problems:   Sepsis due to cellulitis ([place])   Polysubstance abuse ([place])   Hypokalemia   IVDU (intravenous drug user)   Streptococcal bacteremia   Right arm cellulitis   Staphylococcus aureus infection   Acute blood loss anemia   Upper GI bleed   Respiratory failure, post-operative ([facility])   Hepatitis C 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   Protein-calorie malnutrition, severe   Bacterial skin infection of upper extremity   Electrolyte abnormality   Encounter for central line care   Bilateral upper extremity cellulitis and abscesses - 12/12 with CT's  showing diffuse soft tissue swelling of R arm  - Hand surgery/plastic surgery closely following  - s/p multiple I&D's - 12/13, 12/15, 12/16, 12/21, 12/29, 12/31, 1/4 - Patient reports being told by surgeons that he will need may be a couple more I&D's on the right upper extremity followed by skin grafting on bilateral upper extremities. -As per ID sign off note 12/21: Polymicrobial infections of upper extremities, continue daptomycin while inpatient and when ready for DC would send him out on 30 days of Zyvox 600 Mg twice daily.  ID signed off, planning for daptomycin while inpatient, 30 days zyvox when ready for discharge.   -I reached out to ID on 1/5 who recommended that they be contacted closer to time of discharge for final recommendations regarding choice and duration of antibiotics and he may not need 30 days of Zyvox. - cx with strep pyogenes, rare MRSA and Streptococcus pneumonia - Multimodality pain control.   Streptococcal bacteremia Strep pyogenes from 11/14/2021 blood cx 12/14 blood cultures x2: Negative. Appreciate ID recs as above, daptomycin, then linezolid x30 days at discharge Echo 11/16/2021 with EF 55-60%, RVSF normal, abnormal MV (unchanged thickening from prior), AV thickening greater than expected for age, but stable from prior (mild to mod AV regurg) Per ID note, 12/16, GAS not typcial cause or concern for endocarditis, no TEE planned   Upper GI bleed 12/15 with mild proximal mid esophagitis, non bleeding gastric ulcers, non bleeding duodenal ulcers Per GI, if recurrent may need repeat endoscopy Path negative for h pyloir, antral and oxyntic mucosa with hyperemia PPI BID Avoid NSAIDs - using multiple prior to admission   Acute blood loss anemia Transfuse as needed  S/p 4 units pRBC Labs c/w iron def anemia Follow b12, folate (wnl) Hemoglobin stable in the low 7 g range.  Follow CBC closely and transfuse if hemoglobin 7 g or less. Hemoglobin down to 7.2 and given  need for ongoing multiple surgical procedures, expected to drift down.  We will transfuse 1 unit PRBC 1/5.  Patient consented.   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  Follow periodically.   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  Hep C quantitative 987, 000 IU/mL.  Follow outpatient with ID.    Encounter for central line care In place since 11/17/2021 Has PIV at this time Central line discontinued on 1/3.   Respiratory failure, post-operative ([place]) Extubated 12/15  Body mass index is 20.72 kg/m.  Nutritional Status Nutrition Problem: Severe Malnutrition Etiology: acute illness, wound healing Signs/Symptoms: severe fat depletion, severe muscle depletion Interventions: Prostat, MVI, Ensure Enlive (each supplement provides 350kcal and 20 grams of protein)    DVT prophylaxis: Place and maintain sequential compression device Start: 12/04/21 1856 SCDs Start: 11/14/21 1835     Code Status: Full Code Family Communication: None at bedside Disposition:  Status is: Inpatient  Remains inpatient appropriate because: Severity of illness and need for procedures.      Consultants:   Hand ID GI PCCM psych  Procedures:   See prior notes, several I&D's, orthopedic procedures by hand EGD with GI  Antimicrobials:   IV daptomycin 12/15 >   Subjective:  Patient described in detail regarding his prior procedures and upcoming procedures.  States that he he was told that he had 2 large abscesses in his right upper extremity that were drained yesterday and he needed at least another 2 I&D procedures followed by skin grafting.  He mentioned that the surgeons even examined his left upper extremity yesterday.  Pain  appears to be controlled.  Some dizziness when he first gets up to ambulate.  Objective:   Vitals:   12/08/21 0005 12/08/21 0215 12/08/21 0624 12/08/21 1050  BP: (!) 155/75 (!) 181/102 137/74 (!) 145/81  Pulse: (!) 115 (!) 118 (!) 115 (!) 110  Resp: 16 18 20 20   Temp: 98.1 F (36.7 C) 97.9 F (36.6 C) 98.7 F (37.1 C) 98.4 F (36.9 C)  TempSrc: Oral Oral Oral Oral  SpO2: 99% 100% 99% 100%  Weight:      Height:        General exam:, Moderately built and poorly nourished lying comfortably propped up in bed without distress. Respiratory system: Clear to auscultation.  No increased work of breathing. Cardiovascular system: S1 & S2 heard, RRR. No JVD, murmurs, rubs, gallops or clicks. No pedal edema.  Not on telemetry. Gastrointestinal system: Abdomen is nondistended, soft and nontender. No organomegaly or masses felt. Normal bowel sounds heard. Central nervous system: Alert and oriented. No focal neurological deficits. Extremities: Symmetric 5 x 5 power.  Right upper extremity postop dressing up to mid upper arm.  Has wound VAC.  Left upper extremity with postop dressing up to mid forearm. Skin: No rashes, lesions or ulcers Psychiatry: Judgement and insight appear normal. Mood & affect appropriate.     Data Reviewed:   I have personally reviewed following labs and imaging studies   CBC: Recent Labs  Lab 12/05/21 1400 12/06/21 0507 12/07/21 0519 12/08/21  0616  WBC 10.4 8.7 8.4 11.5*  NEUTROABS 6.5 4.6 4.6  --   HGB 7.8* 7.5* 7.4* 7.2*  HCT 26.1* 25.2* 24.2* 24.3*  MCV 88.8 89.0 88.0 88.0  PLT 521* 527* 499* 637*    Basic Metabolic Panel: Recent Labs  Lab 12/02/21 0442 12/05/21 1400 12/06/21 0507 12/07/21 0519 12/08/21 0616  NA 134* 132* 138 136 137  K 4.1 3.8 3.7 4.1 4.1  CL 102 102 106 104 100  CO2 27 25 27 22 25   GLUCOSE 159* 98 104* 121* 116*  BUN 21* 14 15 22* 16  CREATININE 0.44* 0.46* 0.49* 0.44* 0.45*  CALCIUM 8.4* 8.2* 8.4* 8.5* 8.5*  MG  --  1.7  1.8 1.6* 1.9  PHOS  --  4.4 5.5* 5.1* 4.7*    Liver Function Tests: Recent Labs  Lab 12/05/21 1400 12/06/21 0507 12/07/21 0519  AST 22 21 18   ALT 25 22 19   ALKPHOS 105 100 89  BILITOT 0.4 0.3 0.3  PROT 7.6 6.9 6.9  ALBUMIN 2.8* 2.4* 2.5*    CBG: No results for input(s): GLUCAP in the last 168 hours.  Microbiology Studies:  No results found for this or any previous visit (from the past 240 hour(s)).  Radiology Studies:  No results found.  Scheduled Meds:    (feeding supplement) PROSource Plus  30 mL Oral TID BM   vitamin C  500 mg Oral Daily   Chlorhexidine Gluconate Cloth  6 each Topical Daily   feeding supplement  237 mL Oral BID BM   ferrous sulfate  325 mg Oral Q breakfast   gabapentin  300 mg Oral QHS   ketorolac  15 mg Intravenous Q6H   nicotine  21 mg Transdermal Daily   pantoprazole  40 mg Oral BID   polyethylene glycol  17 g Oral BID   QUEtiapine  100 mg Oral BID   senna-docusate  2 tablet Oral QHS    Continuous Infusions:    DAPTOmycin (CUBICIN)  IV 500 mg (12/07/21 1957)     LOS: 24 days     Vernell Leep, MD,  FACP, [facility], [facility], [facility] (Care Management Physician Certified) [place]  To contact the attending provider between 7A-7P or the covering provider during after hours 7P-7A, please log into the web site [email or web address] and access using universal Hand password for that web site. If you do not have the password, please call the hospital operator.  12/08/2021, 12:56 PM

## 2021-12-08 NOTE — Op Note (Signed)
NAME: Jason Buchanan, Jason Buchanan MEDICAL RECORD NO: [id number] ACCOUNT NO: [id number] DATE OF BIRTH: [birth year] FACILITY: [facility] LOCATION: [id number] PHYSICIAN: Mariajose Mow C. Izora Ribas, MD  Operative Report   DATE OF PROCEDURE: 11/25/2021  PREOPERATIVE DIAGNOSIS:  Infection of bilateral upper extremities.  POSTOPERATIVE DIAGNOSIS:  Infection of bilateral upper extremities.  PROCEDURE: 1.  Incision and drainage and debridement of left upper extremity and placement of a VAC dressing. 2.  Incision and drainage and debridement of left hand.  ANESTHESIA:  General.  COMPLICATIONS:  No acute complications.  ESTIMATED BLOOD LOSS:  Minimal.  INDICATIONS:  The patient is a 36 year old gentleman who presented with signs and symptoms of severe infections of bilateral extremities.  He has been taken to the operating room several times for previous I and Ds.  He is here for repeat I and D of both  extremities.  Consent has been obtained.  DESCRIPTION OF PROCEDURE:  The patient was taken to the operating room and placed supine.  Anesthesia was administered.  A timeout was performed identifying the correct procedure, and location.  Both extremities were prepped and draped in the normal  sterile fashion.  Tourniquet was used on the right upper extremity.  The arm was elevated, tourniquet was inflated to 250 mmHg.  The wound, which had been left open with packing was addressed.  All the packing was removed.  There was some nonviable  tissue, mostly on the ulnar aspect of the forearm.  This was irrigated and debrided sharply with a knife and scissors.  There was also some tunneling to the medial upper arm that was also irrigated thoroughly.  After no gross signs of infection were  present, a VAC dressing was felt necessary.  The VAC sponge was fashioned to fit in the defect and placed.  The VAC was hooked up and there were no suction leaks.  Next, the left upper extremity was addressed.  There was some purulent drainage from the   more proximal wound on the dorsal aspect of the hand.  Previously, there was an incision on the hand and then a separate incision on the distal forearm dorsally. To thoroughly irrigate this entire space and the extensor tendon apparatus, these incisions  were joined with one long one.  Thorough irrigation was performed along the extensor tendon sheath of the hand and the distal forearm.  This was loosely approximated over a Penrose drain that exited proximally to allow drainage.  Both arms were dressed  in sterile dressings and Ace wraps.  The patient tolerated the procedure well and was taken to the recovery room stable.   PAA D: 12/08/2021 4:05:20 pm T: 12/08/2021 11:04:00 pm  JOB: [id number]/ [id number]

## 2021-12-09 DIAGNOSIS — L089 Local infection of the skin and subcutaneous tissue, unspecified: Secondary | ICD-10-CM | POA: Diagnosis not present

## 2021-12-09 LAB — CBC
HCT: 31.7 % — ABNORMAL LOW (ref 39.0–52.0)
Hemoglobin: 9.8 g/dL — ABNORMAL LOW (ref 13.0–17.0)
MCH: 26.8 pg (ref 26.0–34.0)
MCHC: 30.9 g/dL (ref 30.0–36.0)
MCV: 86.8 fL (ref 80.0–100.0)
Platelets: 709 10*3/uL — ABNORMAL HIGH (ref 150–400)
RBC: 3.65 MIL/uL — ABNORMAL LOW (ref 4.22–5.81)
RDW: 16.2 % — ABNORMAL HIGH (ref 11.5–15.5)
WBC: 10 10*3/uL (ref 4.0–10.5)
nRBC: 0 % (ref 0.0–0.2)

## 2021-12-09 LAB — TYPE AND SCREEN
ABO/RH(D): A POS
Antibody Screen: NEGATIVE
Unit division: 0

## 2021-12-09 LAB — CK: Total CK: 20 U/L — ABNORMAL LOW (ref 49–397)

## 2021-12-09 LAB — BPAM RBC
Blood Product Expiration Date: 202301252359
ISSUE DATE / TIME: 202301051850
Unit Type and Rh: 6200

## 2021-12-09 NOTE — Progress Notes (Signed)
Pharmacy Antibiotic Note  Jason Buchanan is a 36 y.o. male admitted on 11/14/2021 with bilateral upper extremity wounds now s/p fasciotomy and multiple I&D.  Pharmacy has been consulted for daptomycin dosing.  Today, 12/09/21  - Day #23 Dapto - SCr stable at 0.45 (1/5)  - Ck 65 (12/16), 18 (12/23), 30 (12/30), 20 (1/6) - Weight stable at 60kg  Plan: Continue Daptomycin 8 mg/kg daily as inpatient No dose adjustments currently needed, Pharmacy will sign off writing notes q3 days but will continue to follow renal function and weekly CK (next 1/13) and contact MD if adjustments required  Height: 5\' 7"  (170.2 cm) Weight: 60 kg (132 lb 4.4 oz) IBW/kg (Calculated) : 66.1  Temp (24hrs), Avg:98.1 F (36.7 C), Min:97.5 F (36.4 C), Max:98.5 F (36.9 C)  Recent Labs  Lab 12/05/21 1400 12/06/21 0507 12/07/21 0519 12/08/21 0616 12/09/21 0552  WBC 10.4 8.7 8.4 11.5* 10.0  CREATININE 0.46* 0.49* 0.44* 0.45*  --      Estimated Creatinine Clearance: 109.4 mL/min (A) (by C-G formula based on SCr of 0.45 mg/dL (L)).    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 Had prolonged block (>30 min) after succinylcholine for GI procedure.    Antimicrobials this admission:  12/12 rocephin/vanc >> 12/12 12/13 clinda >> 12/13 12/13 PCN G >> 12/15 12/15 CTX>12/19 12/15 Flagyl>>12/16 12/15 dapto >>  Dose adjustments this admission: none  Microbiology results:  12/12 BCx: 1/4 strep pyogenes; R EES, sens to others  12/13 left hand dorsal: abund streptococcus pyogenes, rare MRSA (S vanc), rare strep pneumoniae 12/13 R wrist: mod strep pyoges, rare MRSA 12/14 repeat BCx: NGF 12/15 MRSA PCR: detected  1/16, PharmD, BCPS Pharmacy: [phone] 12/09/2021 7:56 AM

## 2021-12-09 NOTE — Progress Notes (Addendum)
PROGRESS NOTE   Jason Buchanan  [id number]    DOB: 1986/03/03    DOA: 11/14/2021  PCP: [name], NP   I have briefly reviewed patients previous medical [facility] in [facility].  Chief complaint: Bilateral upper extremity pain   Brief Narrative:  36 year old male with medical history significant for polysubstance abuse including IVDA presented with bilateral arm pain.  He has history of injecting multiple drugs in both arms.  CT of upper extremity showed extensive cellulitis and he was initiated on broad-spectrum antibiotic therapy.  Since then patient has undergone multiple bilateral upper extremity I&D's by hand surgery and plastic surgery.  Blood cultures also positive for Streptococcus group B.  On 12/5, he developed melena with acute blood loss anemia requiring PRBC transfusion, GI evaluated by EGD confirming mild proximal mid esophagitis, nonbleeding gastric ulcers with no stigmata of bleeding, positive adherent clot, nonbleeding duodenal ulcers with adherent clot, ulcers were injected.  Postprocedure he developed respiratory failure requiring intubation and mechanical ventilation.  Extubated 12/15.  Care transferred to [facility] post medical stabilization.  Hand surgery/plastic surgery continue to closely follow, patient has already had several I&D's in the OR and expected to have more per his report.   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[place]) Active Problems:   Sepsis due to cellulitis ([place])   Polysubstance abuse ([place])   Hypokalemia   IVDU (intravenous drug user)   Streptococcal bacteremia   Right arm cellulitis   Staphylococcus aureus infection   Acute blood loss anemia   Upper GI bleed   Respiratory failure, post-operative (HCC)   Hepatitis C 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   Protein-calorie malnutrition, severe   Bacterial skin infection of upper extremity   Electrolyte abnormality   Encounter  for central line care   Bilateral upper extremity cellulitis and abscesses - 12/12 with CT's showing diffuse soft tissue swelling of R arm  - Hand surgery/plastic surgery closely following  - s/p multiple I&D's - 12/13, 12/15, 12/16, 12/21, 12/29, 12/31, 1/4 - Patient reports being told by surgeons that he will need may be a couple more I&D's on the right upper extremity followed by skin grafting on bilateral upper extremities. -As per ID sign off note 12/21: Polymicrobial infections of upper extremities, continue daptomycin while inpatient and when ready for DC would send him out on 30 days of Zyvox 600 Mg twice daily.  ID signed off, planning for daptomycin while inpatient, 30 days zyvox when ready for discharge.   -I reached out to ID on 1/5 who recommended that they be contacted closer to time of discharge for final recommendations regarding choice and duration of antibiotics and he may not need 30 days of Zyvox. - cx with strep pyogenes, rare MRSA and Streptococcus pneumonia - Multimodality pain control. -Patient with more pain in the medial aspect of the right upper arm and left hand today.  He is under the impression that he may be going back to the OR today.  -Remains on IV daptomycin as noted below.  CK 30.  Managed by pharmacy.   Streptococcal bacteremia Strep pyogenes from 11/14/2021 blood cx 12/14 blood cultures x2: Negative. Appreciate ID recs as above, daptomycin, then linezolid x30 days at discharge Echo 11/16/2021 with EF 55-60%, RVSF normal, abnormal MV (unchanged thickening from prior), AV thickening greater than expected for age, but stable from prior (mild to mod AV regurg) Per ID note, 12/16, GAS not typcial cause or concern for  endocarditis, no TEE planned   Upper GI bleed 12/15 with mild proximal mid esophagitis, non bleeding gastric ulcers, non bleeding duodenal ulcers Per GI, if recurrent may need repeat endoscopy Path negative for h pyloir, antral and oxyntic mucosa  with hyperemia PPI BID Avoid NSAIDs - using multiple prior to admission.  Was also on scheduled Toradol here, now discontinued.   Acute blood loss anemia Transfuse as needed S/p 4 units pRBC Labs c/w iron def anemia Follow b12, folate (wnl) Hemoglobin stable in the low 7 g range.  Follow CBC closely and transfuse if hemoglobin 7 g or less. Hemoglobin down to 7.2 on 1/5 and given need for ongoing multiple surgical procedures, expected to drift down.  Transfused 1 unit (5 units total this admission) PRBC and hemoglobin up to 9.8.  Reactive thrombocytosis Secondary to infectious etiology.  Trend CBCs.   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  Follow periodically.   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  Hep C quantitative 987, 000 IU/mL.  Follow outpatient with ID.    Encounter for central line care In place since 11/17/2021 Has PIV at this time Central line discontinued on 1/3.   Respiratory failure, post-operative ([place]) Extubated 12/15  Body mass index is 20.72 kg/m.  Nutritional Status Nutrition Problem: Severe Malnutrition Etiology: acute illness, wound healing Signs/Symptoms: severe fat depletion, severe muscle depletion Interventions: Prostat, MVI, Ensure Enlive (each supplement provides 350kcal and 20 grams of protein)    DVT prophylaxis: Place and maintain sequential compression device Start: 12/04/21 1856 SCDs Start: 11/14/21 1835     Code Status: Full Code Family Communication: None at bedside Disposition:  Status is: Inpatient  Remains inpatient appropriate because: Severity of illness and need for procedures.      Consultants:   Hand ID GI PCCM psych  Procedures:   See prior  notes, several I&D's, orthopedic procedures by hand EGD with GI  Antimicrobials:   IV daptomycin 12/15 >   Subjective:  Patient with more pain in the medial aspect of the right upper arm and left hand today.  He is under the impression that he may be going back to the OR today.   Objective:   Vitals:   12/08/21 1318 12/08/21 1910 12/08/21 2111 12/09/21 0514  BP: (!) 167/95 (!) 166/80 (!) 160/88 (!) 152/83  Pulse: (!) 109 (!) 102 100 99  Resp: 20 20 16    Temp: 98.3 F (36.8 C) 98.5 F (36.9 C) 97.9 F (36.6 C) (!) 97.5 F (36.4 C)  TempSrc: Oral Oral Oral Oral  SpO2: 100% 100% 100% 100%  Weight:      Height:        General exam:, Moderately built and poorly nourished lying comfortably propped up in bed in some painful distress. Respiratory system: Clear to auscultation.  No increased work of breathing. Cardiovascular system: S1 & S2 heard, RRR. No JVD, murmurs, rubs, gallops or clicks. No pedal edema.  Not on telemetry. Gastrointestinal system: Abdomen is nondistended, soft and nontender. No organomegaly or masses felt. Normal bowel sounds heard. Central nervous system: Alert and oriented. No focal neurological deficits. Extremities: Symmetric 5 x 5 power.  Right upper extremity postop dressing up to mid upper arm.  Has wound VAC.  Left upper extremity with postop dressing up to mid forearm.  No new or acute findings noted.  Right upper arm compartments are soft.  Similarly able to move his left fingers well without difficulty and does not have much swelling like he did 72 hours ago. Skin: No rashes, lesions or ulcers Psychiatry: Judgement and insight appear normal. Mood & affect appropriate.     Data Reviewed:   I have personally reviewed following labs and imaging studies   CBC: Recent Labs  Lab 12/05/21 1400 12/06/21 0507 12/07/21 0519 12/08/21 0616 12/09/21 0552  WBC 10.4 8.7 8.4 11.5* 10.0  NEUTROABS 6.5 4.6 4.6  --   --   HGB 7.8* 7.5* 7.4* 7.2* 9.8*  HCT  26.1* 25.2* 24.2* 24.3* 31.7*  MCV 88.8 89.0 88.0 88.0 86.8  PLT 521* 527* 499* 637* 709*    Basic Metabolic Panel: Recent Labs  Lab 12/05/21 1400 12/06/21 0507 12/07/21 0519 12/08/21 0616  NA 132* 138 136 137  K 3.8 3.7 4.1 4.1  CL 102 106 104 100  CO2 25 27 22 25   GLUCOSE 98 104* 121* 116*  BUN 14 15 22* 16  CREATININE 0.46* 0.49* 0.44* 0.45*  CALCIUM 8.2* 8.4* 8.5* 8.5*  MG 1.7 1.8 1.6* 1.9  PHOS 4.4 5.5* 5.1* 4.7*    Liver Function Tests: Recent Labs  Lab 12/05/21 1400 12/06/21 0507 12/07/21 0519  AST 22 21 18   ALT 25 22 19   ALKPHOS 105 100 89  BILITOT 0.4 0.3 0.3  PROT 7.6 6.9 6.9  ALBUMIN 2.8* 2.4* 2.5*    CBG: No results for input(s): GLUCAP in the last 168 hours.  Microbiology Studies:  No results found for this or any previous visit (from the past 240 hour(s)).  Radiology Studies:  No results found.  Scheduled Meds:    (feeding supplement) PROSource Plus  30 mL Oral TID BM   vitamin C  500 mg Oral Daily   Chlorhexidine Gluconate Cloth  6 each Topical Daily   feeding supplement  237 mL Oral BID BM   ferrous sulfate  325 mg Oral Q breakfast   gabapentin  300 mg Oral QHS   nicotine  21 mg Transdermal Daily   pantoprazole  40 mg Oral BID   polyethylene glycol  17 g Oral BID   QUEtiapine  100 mg Oral BID    Continuous Infusions:    DAPTOmycin (CUBICIN)  IV 500 mg (12/08/21 2115)     LOS: 25 days     Vernell Leep, MD,  FACP, [facility], [facility], [facility] (Care Management Physician Certified) [place]  To contact the attending provider between 7A-7P or the covering provider during after hours 7P-7A, please log into the web site [email or web address] and access using universal [place] password for that web site. If you do not have the password, please call the hospital operator.  12/09/2021, 2:12 PM

## 2021-12-10 DIAGNOSIS — A419 Sepsis, unspecified organism: Secondary | ICD-10-CM | POA: Diagnosis not present

## 2021-12-10 DIAGNOSIS — L0390 Cellulitis, unspecified: Secondary | ICD-10-CM | POA: Diagnosis not present

## 2021-12-10 LAB — CBC
HCT: 30.8 % — ABNORMAL LOW (ref 39.0–52.0)
Hemoglobin: 9.5 g/dL — ABNORMAL LOW (ref 13.0–17.0)
MCH: 27 pg (ref 26.0–34.0)
MCHC: 30.8 g/dL (ref 30.0–36.0)
MCV: 87.5 fL (ref 80.0–100.0)
Platelets: 747 10*3/uL — ABNORMAL HIGH (ref 150–400)
RBC: 3.52 MIL/uL — ABNORMAL LOW (ref 4.22–5.81)
RDW: 16.1 % — ABNORMAL HIGH (ref 11.5–15.5)
WBC: 12.6 10*3/uL — ABNORMAL HIGH (ref 4.0–10.5)
nRBC: 0 % (ref 0.0–0.2)

## 2021-12-10 LAB — BASIC METABOLIC PANEL
Anion gap: 10 (ref 5–15)
BUN: 16 mg/dL (ref 6–20)
CO2: 24 mmol/L (ref 22–32)
Calcium: 8.6 mg/dL — ABNORMAL LOW (ref 8.9–10.3)
Chloride: 100 mmol/L (ref 98–111)
Creatinine, Ser: 0.47 mg/dL — ABNORMAL LOW (ref 0.61–1.24)
GFR, Estimated: >60 mL/min (ref >60)
Glucose, Bld: 100 mg/dL — ABNORMAL HIGH (ref 70–99)
Potassium: 3.8 mmol/L (ref 3.5–5.1)
Sodium: 134 mmol/L — ABNORMAL LOW (ref 135–145)

## 2021-12-10 MED ORDER — GABAPENTIN 300 MG PO CAPS
300.0000 mg | ORAL_CAPSULE | Freq: Three times a day (TID) | ORAL | Status: DC
  Administered 2021-12-10 – 2021-12-15 (×15): 300 mg via ORAL
  Filled 2021-12-10 (×15): qty 1

## 2021-12-10 NOTE — Progress Notes (Incomplete)
Progress Note   Patient: Jason Buchanan DOB: 1986-07-10 DOA: 11/14/2021     26 DOS: the patient was seen and examined on 12/10/2021   Brief hospital course: MIR AMBER. Jason Buchanan was admitted to the hospital with the working diagnosis of severe sepsis due to right upper extremities cellulitis, complicated with streptococcus bacteremia.    36 yo male with the past medical history of polysubstance abuse who presented with bilateral arm pain. Patient has a h/o of injecting multiple drugs in both arms.   CT scan of upper extremities showed extensive cellulitis and he was placed on broad-spectrum antibiotic therapy.   12/13 I&D of bilateral upper extremities. Blood culture positive for Streptococcus group B.   12/15 patient developed melena with acute blood loss, hemoglobin down to 5.8. He received 2 units packed red blood cell transfusion and was placed on intravenous pantoprazole. 12/15 upper endoscopy showing mild proximal mid esophagitis.  Nonbleeding gastric ulcers with no stigmata of bleeding.  Positive adherent clot.  Nonbleeding duodenal ulcers with adherent clot.  Ulcers were injected. He had postprocedure respiratory failure that required invasive mechanical ventilation.  Extubated late 12/15.   12/20 Incision and drainage and debridement of right upper extremity, incision and drainage of left upper extremity.    12/23 bilateral arms I&D, the OR under anesthesia.    12/29 I and D of b/l UE with wound vac change on right  Assessment and Plan Encounter for central line care In place since 11/17/2021 Has PIV at this time Will plan to d/c central line  Electrolyte abnormality Replace and follow  Bacterial skin infection of upper extremity 12/12 with CT's showing diffuse soft tissue swelling of R arm  S/p multiple I&D's - 12/13, 12/15, 12/16, 12/21, and 12/29 Continued RUE swelling today, follow CT of right humerus -> with new areas low attenuation in the medial  arm musculature along the mid distal humerus level concerning for abscesses/pyomyositis Discussed with ortho, no plan for MRI at this time, they'll reeval for potential procedure Plan for OR with hand 1/4 Plastics following as well ID signed off, planning for daptomycin while inpatient, 30 days zyvox when ready for discharge (may need to reassess closer to discharge depending on how long he's here - 11/23/2021 was their sign off note) cx with strep pyogenes and MRSA Upper extremity pain, adjust pain meds as needed - non opiate adjuncts as well  Hepatitis C 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 Follow outpatient   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HCC) Seroquel, trazodone Seen by psych 12/17, no recommendation for inpatient psych  Respiratory failure, post-operative ([place]) Extubated 12/15  Upper GI bleed 12/15 with mild proximal mid esophagitis, non bleeding gastric ulcers, non bleeding duodenal ulcers Per GI, if recurrent may need repeat endoscopy Path negative for h pyloir, antral and oxyntic mucosa with hyperemia PPI BID Avoid NSAIDs - using multiple prior to admission  Acute blood loss anemia Transfuse as needed S/p 4 units pRBC Labs c/w iron def anemia Follow b12, folate (wnl)  Streptococcal bacteremia Strep pyogenes from 11/14/2021 blood cx 12/14 cx NGTD Appreciate ID recs as above, daptomycin, then linezolid x30 days at discharge Echo 11/16/2021 with EF 55-60%, RVSF normal, abnormal MV (unchanged thickening from prior), AV thickening greater than expected for age, but stable from prior (mild to mod AV regurg) Per ID note, 12/16, GAS not typcial cause or concern for endocarditis, no TEE planned  IVDU (intravenous drug user)- (present on admission) He's interested in [place] after discharge   {Tip  this will not be part of the note when signed Body mass index is 20.72 kg/m. ,  ,     (Optional):26781}  Subjective:  ***  Objective {Vitals:26382} ***  Data Reviewed: {Tip this will not be part of the note when signed- Document your independent interpretation of telemetry tracing, EKG, lab, Radiology test or any other diagnostic tests. Add any new diagnostic test ordered today. (Optional):26781} {Results:26384}  Family Communication: ***  Disposition: Status is: Inpatient  {Inpatient:23812}    {Tip this will not be part of the note when signed  DVT Prophylaxis  ., Scds  Place and maintain sequential compression device       (Optional):26781}   Time spent: *** minutes  Author: Debbe Odea, MD 12/10/2021 6:33 PM  For on call review [email or web address].

## 2021-12-10 NOTE — Progress Notes (Signed)
PROGRESS NOTE    Jason Buchanan  [id number] DOB: Dec 13, 1985 DOA: 11/14/2021 PCP: [name], NP     Brief Narrative:  36 year old male with medical history significant for polysubstance abuse including IVDA presented with bilateral arm pain.  He has history of injecting multiple drugs in both arms.  CT of upper extremity showed extensive cellulitis and he was initiated on broad-spectrum antibiotic therapy.  Since then patient has undergone multiple bilateral upper extremity I&D's by hand surgery and plastic surgery.  Blood cultures also positive for Streptococcus group B.  On 12/5, he developed melena with acute blood loss anemia requiring PRBC transfusion, GI evaluated by EGD confirming mild proximal mid esophagitis, nonbleeding gastric ulcers with no stigmata of bleeding, positive adherent clot, nonbleeding duodenal ulcers with adherent clot, ulcers were injected.  Postprocedure he developed respiratory failure requiring intubation and mechanical ventilation.  Extubated 12/15.  Care transferred to [facility] post medical stabilization.  Hand surgery/plastic surgery continue to closely follow, patient has already had several I&D's in the OR and expected to have more per his report.   New events last 24 hours / Subjective: Pt reports having quite a bit of pain despite a liberal amount of several pain meds. He has a hx of upper GI bleed so Toradol was stopped. He is on Dilaudid q 2 and oxycodone. Had I&D of LUE/hand on 1/5 w gen surg team. Hopeful he will only need a few more I&D's and a graft by plastics afterwards.   Assessment & Plan:   Principal Problem:   Sepsis due to cellulitis ([place])  Daptomycin, pharmacy dosing until d/c  Zyvox once home, will need to touch base w ID when that time is closer  ID has signed off  Appreciate Plastics, Ortho, Hand input  Dilaudid, Tylenol, Oxycodone  I increased his gabapentin from 300 mg qhs to TID; will consider changing to Lyrica Active Problems:    Polysubstance abuse (HCC)/IVDU (intravenous drug user)  Pt entering program upon d/c to aid cessation  Appreciate SW    Hypokalemia  Resolved, monitor    Streptococcal bacteremia  As above  No TEE planned per ID    Right arm cellulitis  As above    Staphylococcus aureus infection  Daptomycin, pharmacy dosing until d/c  Zyvox 600 mg bid for 30 d per ID once home, will need to touch base w ID when that time is closer    Acute blood loss anemia 2/2 Upper GI bleed  Hold Toradol and NSAIDs  Monitor CBC    Respiratory failure, post-operative (HCC)  Resolved    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HCC)  Not recommended for inpatient psych  Trazodone and Seroquel    Hepatitis C 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  Follow outpatient w hepatology    Protein-calorie malnutrition, severe   Nutrition Problem: Severe Malnutrition Etiology: acute illness, wound healing   DVT prophylaxis: SCDs Code Status: Full Family Communication: Self Coming From: Home Disposition Plan: Status is inpatient, remains appropriate inpatient because of severity of illness Barriers to Discharge: Medical improvement  Consultants:  Hand surgery Plastic surgery Orthopedic surgery PCCM Wound Care Psychiatry GI  Procedures:  12/08/21- I&D and debridement of LUE and L hand 12/07/21- Wound vac change of RUE, I&D of deep abscess of  R upper arm 12/03/21- I&D 12/01/21- I&D Incision and drainage and debridement right upper extremity/incision and drainage of left upper extremity by Dr. Lenon Curt hand surgeon 11/23/2021. Wound evaluation left upper extremity with dressing change under sedation/incision and drainage  right upper extremity and loose closure of wound, right upper extremity per Dr. Lenon Curt, hand surgeon 11/18/2021 Right hand fasciotomy/right hand carpal tunnel release/right hand renal scan no release/right forearm 3 compartment fasciotomy/thorough irrigation of hand and  forearm/dorsal hand incision and drainage including extensor tendon sheath per Dr. Lenon Curt, hand surgeon 11/17/2021 Central line insertion per PCCM, Dr. Tamala Julian 11/17/2021 Upper endoscopy per GI, Dr. Watt Climes 11/17/2021 Left wrist deep abscess incision and drainage/left volar forearm fasciotomy/left wrist carpal tunnel release extended with Guyon's canal release--per Dr. Greta Doom orthopedics 11/15/2021  Antimicrobials:  Anti-infectives (From admission, onward)    Start     Dose/Rate Route Frequency Ordered Stop   11/17/21 1700  cefTRIAXone (ROCEPHIN) 2 g in sodium chloride 0.9 % 100 mL IVPB  Status:  Discontinued        2 g 200 mL/hr over 30 Minutes Intravenous Every 24 hours 11/17/21 1019 11/21/21 1047   11/17/21 1700  DAPTOmycin (CUBICIN) 500 mg in sodium chloride 0.9 % IVPB        8 mg/kg  60 kg 120 mL/hr over 30 Minutes Intravenous Daily 11/17/21 1555     11/17/21 1200  metroNIDAZOLE (FLAGYL) IVPB 500 mg  Status:  Discontinued        500 mg 100 mL/hr over 60 Minutes Intravenous Every 12 hours 11/17/21 1019 11/18/21 0924   11/15/21 1000  cefTRIAXone (ROCEPHIN) 2 g in sodium chloride 0.9 % 100 mL IVPB  Status:  Discontinued        2 g 200 mL/hr over 30 Minutes Intravenous Every 24 hours 11/14/21 1834 11/15/21 0405   11/15/21 1000  clindamycin (CLEOCIN) IVPB 600 mg  Status:  Discontinued        600 mg 100 mL/hr over 30 Minutes Intravenous Every 8 hours 11/15/21 0405 11/15/21 1523   11/15/21 0800  penicillin G potassium 12 Million Units in dextrose 5 % 500 mL continuous infusion  Status:  Discontinued        12 Million Units 41.7 mL/hr over 12 Hours Intravenous Every 12 hours 11/15/21 0405 11/17/21 1956   11/14/21 2200  vancomycin (VANCOREADY) IVPB 750 mg/150 mL  Status:  Discontinued        750 mg 150 mL/hr over 60 Minutes Intravenous Every 12 hours 11/14/21 1524 11/15/21 0405   11/14/21 0900  vancomycin (VANCOCIN) IVPB 1000 mg/200 mL premix        1,000 mg 200 mL/hr over 60 Minutes  Intravenous  Once 11/14/21 0849 11/14/21 1043   11/14/21 0900  cefTRIAXone (ROCEPHIN) 2 g in sodium chloride 0.9 % 100 mL IVPB        2 g 200 mL/hr over 30 Minutes Intravenous  Once 11/14/21 0849 11/14/21 0935        Objective: Vitals:   12/08/21 2111 12/09/21 0514 12/09/21 2012 12/10/21 0504  BP: (!) 160/88 (!) 152/83 111/74 (!) 146/82  Pulse: 100 99 (!) 106 (!) 101  Resp: 16  20 20   Temp: 97.9 F (36.6 C) (!) 97.5 F (36.4 C) 98.7 F (37.1 C) 97.9 F (36.6 C)  TempSrc: Oral Oral Oral Oral  SpO2: 100% 100% 100% 98%  Weight:      Height:        Intake/Output Summary (Last 24 hours) at 12/10/2021 1447 Last data filed at 12/10/2021 0900 Gross per 24 hour  Intake 240 ml  Output 1750 ml  Net -1510 ml   Filed Weights   11/14/21 0835 11/15/21 1608 11/25/21 1342  Weight: 60 kg 60 kg 60 kg  Examination:  General exam: Appears calm and comfortable  Respiratory system: Clear to auscultation. Respiratory effort normal. No respiratory distress. No conversational dyspnea.  Cardiovascular system: S1 & S2 heard, RRR. No murmurs. No pedal edema. Gastrointestinal system: Abdomen is nondistended, soft and nontender. Normal bowel sounds heard. Central nervous system: Alert and oriented. No focal neurological deficits. Speech clear.  Extremities: Heavily dressed L wrist and RUE Skin: No rashes, lesions or ulcers on exposed skin  Psychiatry: Judgement and insight appear normal. Mood & affect appropriate.   Data Reviewed: I have personally reviewed following labs and imaging studies  CBC: Recent Labs  Lab 12/05/21 1400 12/06/21 0507 12/07/21 0519 12/08/21 0616 12/09/21 0552 12/10/21 0909  WBC 10.4 8.7 8.4 11.5* 10.0 12.6*  NEUTROABS 6.5 4.6 4.6  --   --   --   HGB 7.8* 7.5* 7.4* 7.2* 9.8* 9.5*  HCT 26.1* 25.2* 24.2* 24.3* 31.7* 30.8*  MCV 88.8 89.0 88.0 88.0 86.8 87.5  PLT 521* 527* 499* 637* 709* [id number]*   Basic Metabolic Panel: Recent Labs  Lab 12/05/21 1400 12/06/21 0507  12/07/21 0519 12/08/21 0616 12/10/21 0909  NA 132* 138 136 137 134*  K 3.8 3.7 4.1 4.1 3.8  CL 102 106 104 100 100  CO2 25 27 22 25 24   GLUCOSE 98 104* 121* 116* 100*  BUN 14 15 22* 16 16  CREATININE 0.46* 0.49* 0.44* 0.45* 0.47*  CALCIUM 8.2* 8.4* 8.5* 8.5* 8.6*  MG 1.7 1.8 1.6* 1.9  --   PHOS 4.4 5.5* 5.1* 4.7*  --    GFR: Estimated Creatinine Clearance: 109.4 mL/min (A) (by C-G formula based on SCr of 0.47 mg/dL (L)).  Liver Function Tests: Recent Labs  Lab 12/05/21 1400 12/06/21 0507 12/07/21 0519  AST 22 21 18   ALT 25 22 19   ALKPHOS 105 100 89  BILITOT 0.4 0.3 0.3  PROT 7.6 6.9 6.9  ALBUMIN 2.8* 2.4* 2.5*    Cardiac Enzymes: Recent Labs  Lab 12/09/21 0552  CKTOTAL 20*    Scheduled Meds:  (feeding supplement) PROSource Plus  30 mL Oral TID BM   vitamin C  500 mg Oral Daily   Chlorhexidine Gluconate Cloth  6 each Topical Daily   feeding supplement  237 mL Oral BID BM   ferrous sulfate  325 mg Oral Q breakfast   gabapentin  300 mg Oral TID   nicotine  21 mg Transdermal Daily   pantoprazole  40 mg Oral BID   polyethylene glycol  17 g Oral BID   QUEtiapine  100 mg Oral BID   Continuous Infusions:  DAPTOmycin (CUBICIN)  IV 500 mg (12/09/21 2101)     LOS: 26 days    Time spent: 30 minutes   Shelda Pal, DO [facility] 12/10/2021, 2:47 PM   Available via Epic secure chat 7am-7pm After these hours, please refer to coverage provider listed on [email or web address]

## 2021-12-11 DIAGNOSIS — L0390 Cellulitis, unspecified: Secondary | ICD-10-CM | POA: Diagnosis not present

## 2021-12-11 DIAGNOSIS — A419 Sepsis, unspecified organism: Secondary | ICD-10-CM | POA: Diagnosis not present

## 2021-12-11 LAB — CBC
HCT: 32.4 % — ABNORMAL LOW (ref 39.0–52.0)
Hemoglobin: 9.7 g/dL — ABNORMAL LOW (ref 13.0–17.0)
MCH: 26.4 pg (ref 26.0–34.0)
MCHC: 29.9 g/dL — ABNORMAL LOW (ref 30.0–36.0)
MCV: 88.3 fL (ref 80.0–100.0)
Platelets: 722 10*3/uL — ABNORMAL HIGH (ref 150–400)
RBC: 3.67 MIL/uL — ABNORMAL LOW (ref 4.22–5.81)
RDW: 15.8 % — ABNORMAL HIGH (ref 11.5–15.5)
WBC: 11.1 10*3/uL — ABNORMAL HIGH (ref 4.0–10.5)
nRBC: 0 % (ref 0.0–0.2)

## 2021-12-11 LAB — BASIC METABOLIC PANEL
Anion gap: 6 (ref 5–15)
BUN: 18 mg/dL (ref 6–20)
CO2: 24 mmol/L (ref 22–32)
Calcium: 8.5 mg/dL — ABNORMAL LOW (ref 8.9–10.3)
Chloride: 101 mmol/L (ref 98–111)
Creatinine, Ser: 0.48 mg/dL — ABNORMAL LOW (ref 0.61–1.24)
GFR, Estimated: >60 mL/min (ref >60)
Glucose, Bld: 119 mg/dL — ABNORMAL HIGH (ref 70–99)
Potassium: 3.5 mmol/L (ref 3.5–5.1)
Sodium: 131 mmol/L — ABNORMAL LOW (ref 135–145)

## 2021-12-11 MED ORDER — HYDROMORPHONE HCL 1 MG/ML IJ SOLN
0.5000 mg | INTRAMUSCULAR | Status: DC | PRN
  Administered 2021-12-11 – 2021-12-12 (×7): 1 mg via INTRAVENOUS
  Filled 2021-12-11 (×7): qty 1

## 2021-12-11 NOTE — Progress Notes (Incomplete)
RN informed pt this morning that the prn dilaudid had been changed to 0.5 mg-1 mg q3h. The pt became agitated after RN administered this medication to the pt around 1502. Pt informed RN that he was going to leave AMA  pull off his wound vac and take out his own IV. Pt informed that if he leaves AMA he would not receive any medication or any help with additional resources. MD made aware. Charge RN made aware.   Pt informed RN that he was not going to leave AMA until tomorrow. Pt wants to speak with the director of the unit tomorrow.

## 2021-12-11 NOTE — Progress Notes (Signed)
PROGRESS NOTE    Jason Buchanan  [id number] DOB: [birth year] DOA: 11/14/2021 PCP: [name], NP     Brief Narrative:  36 year old male with medical history significant for polysubstance abuse including IVDA presented with bilateral arm pain.  He has history of injecting multiple drugs in both arms.  CT of upper extremity showed extensive cellulitis and he was initiated on broad-spectrum antibiotic therapy.  Since then patient has undergone multiple bilateral upper extremity I&D's by hand surgery and plastic surgery.  Blood cultures also positive for Streptococcus group B.  On 12/5, he developed melena with acute blood loss anemia requiring PRBC transfusion, GI evaluated by EGD confirming mild proximal mid esophagitis, nonbleeding gastric ulcers with no stigmata of bleeding, positive adherent clot, nonbleeding duodenal ulcers with adherent clot, ulcers were injected.  Postprocedure he developed respiratory failure requiring intubation and mechanical ventilation.  Extubated 12/15.  Care transferred to [facility] post medical stabilization.  Hand surgery/plastic surgery continue to closely follow, patient has already had several I&D's in the OR and expected to have more per his report.  New events last 24 hours / Subjective: Still having some pain. Reports poor appetite but nurse states he eats all on his tray and asks for snacks. No fevers, drainage, N/V/D, CP or SOB.   Assessment & Plan:   Principal Problem:   Sepsis due to cellulitis ([place])             Daptomycin, pharmacy dosing until d/c             Zyvox once home, will need to touch base w ID when that time is closer             ID has signed off             Appreciate Plastics, Ortho, Hand input             Dilaudid, Tylenol, Oxycodone  Weaning down on Dilaudid from 2 mg q 2 hrs to 0.5-1 mg q 3 hrs             Gabapentin 300 mg TID  Active Problems:   Polysubstance abuse (HCC)/IVDU (intravenous drug user)             Pt entering  program upon d/c to aid cessation             Appreciate SW     Hypokalemia             Resolved, monitor     Streptococcal bacteremia             As above             No TEE planned per ID     Right arm cellulitis             As above     Staphylococcus aureus infection             Daptomycin, pharmacy dosing until d/c             Zyvox 600 mg bid for 30 d per ID once home, will need to touch base w ID when that time is closer     Acute blood loss anemia 2/2 Upper GI bleed             Hold Toradol and NSAIDs             Monitor CBC     Respiratory failure, post-operative (HCC)  Resolved     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[place])             Not recommended for inpatient psych             Trazodone and Seroquel     Hepatitis C 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             Follow outpatient w hepatology     Protein-calorie malnutrition, severe     Nutrition Problem: Severe Malnutrition Etiology: acute illness, wound healing     DVT prophylaxis: SCDs Code Status: Full Family Communication: Self Coming From: Home Disposition Plan: Status is inpatient, remains appropriate inpatient because of severity of illness Barriers to Discharge: Medical improvement   Consultants:  Hand surgery Plastic surgery Orthopedic surgery PCCM Wound Care Psychiatry GI   Procedures:  12/08/21- I&D and debridement of LUE and L hand 12/07/21- Wound vac change of RUE, I&D of deep abscess of  R upper arm 12/03/21- I&D 12/01/21- I&D Incision and drainage and debridement right upper extremity/incision and drainage of left upper extremity by Dr. Lenon Curt hand surgeon 11/23/2021. Wound evaluation left upper extremity with dressing change under sedation/incision and drainage right upper extremity and loose closure of wound, right upper extremity per Dr. Lenon Curt, hand surgeon 11/18/2021 Right hand fasciotomy/right hand carpal tunnel release/right hand renal scan no  release/right forearm 3 compartment fasciotomy/thorough irrigation of hand and forearm/dorsal hand incision and drainage including extensor tendon sheath per Dr. Lenon Curt, hand surgeon 11/17/2021 Central line insertion per PCCM, Dr. Tamala Julian 11/17/2021 Upper endoscopy per GI, Dr. Watt Climes 11/17/2021 Left wrist deep abscess incision and drainage/left volar forearm fasciotomy/left wrist carpal tunnel release extended with Guyon's canal release--per Dr. Greta Doom orthopedics 11/15/2021  Antimicrobials:  Anti-infectives (From admission, onward)    Start     Dose/Rate Route Frequency Ordered Stop   11/17/21 1700  cefTRIAXone (ROCEPHIN) 2 g in sodium chloride 0.9 % 100 mL IVPB  Status:  Discontinued        2 g 200 mL/hr over 30 Minutes Intravenous Every 24 hours 11/17/21 1019 11/21/21 1047   11/17/21 1700  DAPTOmycin (CUBICIN) 500 mg in sodium chloride 0.9 % IVPB        8 mg/kg  60 kg 120 mL/hr over 30 Minutes Intravenous Daily 11/17/21 1555     11/17/21 1200  metroNIDAZOLE (FLAGYL) IVPB 500 mg  Status:  Discontinued        500 mg 100 mL/hr over 60 Minutes Intravenous Every 12 hours 11/17/21 1019 11/18/21 0924   11/15/21 1000  cefTRIAXone (ROCEPHIN) 2 g in sodium chloride 0.9 % 100 mL IVPB  Status:  Discontinued        2 g 200 mL/hr over 30 Minutes Intravenous Every 24 hours 11/14/21 1834 11/15/21 0405   11/15/21 1000  clindamycin (CLEOCIN) IVPB 600 mg  Status:  Discontinued        600 mg 100 mL/hr over 30 Minutes Intravenous Every 8 hours 11/15/21 0405 11/15/21 1523   11/15/21 0800  penicillin G potassium 12 Million Units in dextrose 5 % 500 mL continuous infusion  Status:  Discontinued        12 Million Units 41.7 mL/hr over 12 Hours Intravenous Every 12 hours 11/15/21 0405 11/17/21 1956   11/14/21 2200  vancomycin (VANCOREADY) IVPB 750 mg/150 mL  Status:  Discontinued        750 mg 150 mL/hr over 60 Minutes Intravenous Every 12 hours 11/14/21 1524 11/15/21 0405   11/14/21 0900  vancomycin (VANCOCIN)  IVPB 1000 mg/200 mL premix        1,000 mg 200 mL/hr over 60 Minutes Intravenous  Once 11/14/21 0849 11/14/21 1043   11/14/21 0900  cefTRIAXone (ROCEPHIN) 2 g in sodium chloride 0.9 % 100 mL IVPB        2 g 200 mL/hr over 30 Minutes Intravenous  Once 11/14/21 0849 11/14/21 0935        Objective: Vitals:   12/10/21 0504 12/10/21 1456 12/10/21 1957 12/11/21 0521  BP: (!) 146/82 (!) 147/73 (!) 148/82 140/67  Pulse: (!) 101 (!) 107 (!) 109 (!) 109  Resp: 20 18 20 20   Temp: 97.9 F (36.6 C) 98.4 F (36.9 C) 97.6 F (36.4 C) (!) 97.4 F (36.3 C)  TempSrc: Oral Oral Oral Oral  SpO2: 98% 97% 98% 96%  Weight:      Height:        Intake/Output Summary (Last 24 hours) at 12/11/2021 1308 Last data filed at 12/11/2021 0500 Gross per 24 hour  Intake 960 ml  Output 1725 ml  Net -765 ml   Filed Weights   11/14/21 0835 11/15/21 1608 11/25/21 1342  Weight: 60 kg 60 kg 60 kg    Examination:  General exam: Appears calm and comfortable  Respiratory system: Clear to auscultation. Respiratory effort normal. No respiratory distress. No conversational dyspnea.  Cardiovascular system: S1 & S2 heard, RRR. No murmurs. No pedal edema. Central nervous system: Alert and oriented.  Extremities: L wrist and RUE wrapped in wound dressings Skin: No rashes, lesions or ulcers on exposed skin  Psychiatry: Mood & affect appropriate.   Data Reviewed: I have personally reviewed following labs and imaging studies  CBC: Recent Labs  Lab 12/05/21 1400 12/06/21 0507 12/07/21 0519 12/08/21 0616 12/09/21 0552 12/10/21 0909 12/11/21 0842  WBC 10.4 8.7 8.4 11.5* 10.0 12.6* 11.1*  NEUTROABS 6.5 4.6 4.6  --   --   --   --   HGB 7.8* 7.5* 7.4* 7.2* 9.8* 9.5* 9.7*  HCT 26.1* 25.2* 24.2* 24.3* 31.7* 30.8* 32.4*  MCV 88.8 89.0 88.0 88.0 86.8 87.5 88.3  PLT 521* 527* 499* 637* 709* 747* 123456*   Basic Metabolic Panel: Recent Labs  Lab 12/05/21 1400 12/06/21 0507 12/07/21 0519 12/08/21 0616 12/10/21 0909  12/11/21 0842  NA 132* 138 136 137 134* 131*  K 3.8 3.7 4.1 4.1 3.8 3.5  CL 102 106 104 100 100 101  CO2 25 27 22 25 24 24   GLUCOSE 98 104* 121* 116* 100* 119*  BUN 14 15 22* 16 16 18   CREATININE 0.46* 0.49* 0.44* 0.45* 0.47* 0.48*  CALCIUM 8.2* 8.4* 8.5* 8.5* 8.6* 8.5*  MG 1.7 1.8 1.6* 1.9  --   --   PHOS 4.4 5.5* 5.1* 4.7*  --   --    GFR: Estimated Creatinine Clearance: 109.4 mL/min (A) (by C-G formula based on SCr of 0.48 mg/dL (L)).  Liver Function Tests: Recent Labs  Lab 12/05/21 1400 12/06/21 0507 12/07/21 0519  AST 22 21 18   ALT 25 22 19   ALKPHOS 105 100 89  BILITOT 0.4 0.3 0.3  PROT 7.6 6.9 6.9  ALBUMIN 2.8* 2.4* 2.5*   Cardiac Enzymes: Recent Labs  Lab 12/09/21 0552  CKTOTAL 20*   Scheduled Meds:  (feeding supplement) PROSource Plus  30 mL Oral TID BM   vitamin C  500 mg Oral Daily   Chlorhexidine Gluconate Cloth  6 each Topical Daily   feeding supplement  237 mL Oral BID BM  ferrous sulfate  325 mg Oral Q breakfast   gabapentin  300 mg Oral TID   nicotine  21 mg Transdermal Daily   pantoprazole  40 mg Oral BID   polyethylene glycol  17 g Oral BID   QUEtiapine  100 mg Oral BID   Continuous Infusions:  DAPTOmycin (CUBICIN)  IV 500 mg (12/10/21 2010)     LOS: 27 days    Time spent: 20 minutes   Shelda Pal, DO [facility] 12/11/2021, 1:08 PM   Available via Epic secure chat 7am-7pm After these hours, please refer to coverage provider listed on [email or web address]

## 2021-12-12 DIAGNOSIS — D62 Acute posthemorrhagic anemia: Secondary | ICD-10-CM | POA: Diagnosis not present

## 2021-12-12 DIAGNOSIS — L0390 Cellulitis, unspecified: Secondary | ICD-10-CM | POA: Diagnosis not present

## 2021-12-12 DIAGNOSIS — F15951 Other stimulant use, unspecified with stimulant-induced psychotic disorder with hallucinations: Secondary | ICD-10-CM | POA: Diagnosis not present

## 2021-12-12 DIAGNOSIS — E878 Other disorders of electrolyte and fluid balance, not elsewhere classified: Secondary | ICD-10-CM

## 2021-12-12 DIAGNOSIS — Z452 Encounter for adjustment and management of vascular access device: Secondary | ICD-10-CM

## 2021-12-12 DIAGNOSIS — L03113 Cellulitis of right upper limb: Secondary | ICD-10-CM | POA: Diagnosis not present

## 2021-12-12 LAB — CBC
HCT: 31.4 % — ABNORMAL LOW (ref 39.0–52.0)
Hemoglobin: 9.6 g/dL — ABNORMAL LOW (ref 13.0–17.0)
MCH: 27 pg (ref 26.0–34.0)
MCHC: 30.6 g/dL (ref 30.0–36.0)
MCV: 88.2 fL (ref 80.0–100.0)
Platelets: 652 10*3/uL — ABNORMAL HIGH (ref 150–400)
RBC: 3.56 MIL/uL — ABNORMAL LOW (ref 4.22–5.81)
RDW: 15.7 % — ABNORMAL HIGH (ref 11.5–15.5)
WBC: 10.2 10*3/uL (ref 4.0–10.5)
nRBC: 0 % (ref 0.0–0.2)

## 2021-12-12 LAB — BASIC METABOLIC PANEL
Anion gap: 8 (ref 5–15)
BUN: 17 mg/dL (ref 6–20)
CO2: 25 mmol/L (ref 22–32)
Calcium: 8.7 mg/dL — ABNORMAL LOW (ref 8.9–10.3)
Chloride: 102 mmol/L (ref 98–111)
Creatinine, Ser: 0.4 mg/dL — ABNORMAL LOW (ref 0.61–1.24)
GFR, Estimated: >60 mL/min (ref >60)
Glucose, Bld: 113 mg/dL — ABNORMAL HIGH (ref 70–99)
Potassium: 3.8 mmol/L (ref 3.5–5.1)
Sodium: 135 mmol/L (ref 135–145)

## 2021-12-12 MED ORDER — HYDROMORPHONE HCL 1 MG/ML IJ SOLN
2.0000 mg | INTRAMUSCULAR | Status: DC | PRN
  Administered 2021-12-12 – 2022-01-09 (×186): 2 mg via INTRAVENOUS
  Filled 2021-12-12 (×192): qty 2

## 2021-12-12 NOTE — Progress Notes (Signed)
PROGRESS NOTE    Jason Buchanan  [id number] DOB: 1986/05/25 DOA: 11/14/2021 PCP: [name], NP     Brief Narrative:   36 year old male with medical history significant for polysubstance abuse including intravenous drug abuse presented to hospital with bilateral arm pain from injection to arms bilaterally.  CT scan of the upper extremity showed extensive cellulitis and was initially started on broad-spectrum antibiotic.  After hospitalization, patient has undergone multiple bilateral upper extremity I&D's by hand surgery and plastic surgery.  Blood culture was also positive for beta Streptococcus.  On 11/07/2021 patient developed melena with active GI bleed requiring PRBC transfusion.  GI was consulted and patient underwent EGD confirming mild proximal mild esophagitis with nonbleeding ulcers, nonbleeding duodenal ulcers which were injected.  Post procedure he developed respite failure requiring intubation and mechanical ventilation and was subsequently extubated on 11/17/2021.  Patient was subsequently considered stable for transfer out of the ICU and is currently in the hospital service.    Assessment & Plan:     Sepsis due to cellulitis with intractable pain.  Continue daptomycin until discharge.  Zyvox once the patient goes home.  Patient has been seen by plastic surgery orthopedics and hand surgery.  Currently on Dilaudid Tylenol and oxycodone.  Patient states that he is in extreme pain in Dilaudid dosing was not adequate.  Wishes to milligrams every 3 hours at this time.  Continue gabapentin.  Last I&D was done on 12/08/2021 by Dr. Lenon Curt hand surgery.             Polysubstance abuse  History of IV drug abuse.  He states that he is going to go to drug treatment program after the hospitalization.    Hypokalemia Potassium of 3.8.     Streptococcal bacteremia         ID has seen the patient currently on antibiotic.  No plan for TEE.     Right arm cellulitis Status post I&D and  surgery by plastic surgery orthopedic and hand surgery.     Staphylococcus aureus infection Daptomycin, pharmacy to dose until discharge.  Patient will need to continue Zyvox 600 mg twice daily for 30 days per ID once going home.  Will need to visit with ID prior to discharge    Acute blood loss anemia due to upper GI bleed. Hold Toradol and NSAIDs.  Continue to monitor symptoms.            Postoperative respiratory failure.             Resolved at this time.     Amphetamine and psychostimulant-induced psychosis with hallucinations           Continue Seroquel and trazodone.     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    DVT prophylaxis: SCDs  Code Status: Full code  Family Communication:  Communicated with the patient at bedside  Disposition Plan: Home when medically stable.  Barriers to Discharge: Medical improvement   Consultants:  Hand surgery Plastic surgery Orthopedic surgery PCCM Wound Care Psychiatry GI   Procedures:  12/08/21- I&D and debridement of LUE and L hand 12/07/21- Wound vac change of RUE, I&D of deep abscess of  R upper arm 12/03/21- I&D 12/01/21- I&D Incision and drainage and debridement right upper extremity/incision and drainage of left upper extremity by Dr. Lenon Curt hand surgeon 11/23/2021. Wound evaluation left upper extremity with dressing change under sedation/incision and drainage right  upper extremity and loose closure of wound, right upper extremity per Dr. Lenon Curt, hand surgeon 11/18/2021 Right hand fasciotomy/right hand carpal tunnel release/right hand renal scan no release/right forearm 3 compartment fasciotomy/thorough irrigation of hand and forearm/dorsal hand incision and drainage including extensor tendon sheath per Dr. Lenon Curt, hand surgeon 11/17/2021 Central line insertion per PCCM, Dr. Tamala Julian 11/17/2021 Upper endoscopy per GI, Dr. Watt Climes  11/17/2021 Left wrist deep abscess incision and drainage/left volar forearm fasciotomy/left wrist carpal tunnel release extended with Guyon's canal release--per Dr. Greta Doom orthopedics 11/15/2021  Antimicrobials:  None  Anti-infectives (From admission, onward)    Start     Dose/Rate Route Frequency Ordered Stop   11/17/21 1700  cefTRIAXone (ROCEPHIN) 2 g in sodium chloride 0.9 % 100 mL IVPB  Status:  Discontinued        2 g 200 mL/hr over 30 Minutes Intravenous Every 24 hours 11/17/21 1019 11/21/21 1047   11/17/21 1700  DAPTOmycin (CUBICIN) 500 mg in sodium chloride 0.9 % IVPB        8 mg/kg  60 kg 120 mL/hr over 30 Minutes Intravenous Daily 11/17/21 1555     11/17/21 1200  metroNIDAZOLE (FLAGYL) IVPB 500 mg  Status:  Discontinued        500 mg 100 mL/hr over 60 Minutes Intravenous Every 12 hours 11/17/21 1019 11/18/21 0924   11/15/21 1000  cefTRIAXone (ROCEPHIN) 2 g in sodium chloride 0.9 % 100 mL IVPB  Status:  Discontinued        2 g 200 mL/hr over 30 Minutes Intravenous Every 24 hours 11/14/21 1834 11/15/21 0405   11/15/21 1000  clindamycin (CLEOCIN) IVPB 600 mg  Status:  Discontinued        600 mg 100 mL/hr over 30 Minutes Intravenous Every 8 hours 11/15/21 0405 11/15/21 1523   11/15/21 0800  penicillin G potassium 12 Million Units in dextrose 5 % 500 mL continuous infusion  Status:  Discontinued        12 Million Units 41.7 mL/hr over 12 Hours Intravenous Every 12 hours 11/15/21 0405 11/17/21 1956   11/14/21 2200  vancomycin (VANCOREADY) IVPB 750 mg/150 mL  Status:  Discontinued        750 mg 150 mL/hr over 60 Minutes Intravenous Every 12 hours 11/14/21 1524 11/15/21 0405   11/14/21 0900  vancomycin (VANCOCIN) IVPB 1000 mg/200 mL premix        1,000 mg 200 mL/hr over 60 Minutes Intravenous  Once 11/14/21 0849 11/14/21 1043   11/14/21 0900  cefTRIAXone (ROCEPHIN) 2 g in sodium chloride 0.9 % 100 mL IVPB        2 g 200 mL/hr over 30 Minutes Intravenous  Once 11/14/21 0849  11/14/21 0935       Subjective: Today, patient was seen and examined at bedside.  Patient states that he has been having a lot of pain in his not being controlled and feels extremely frustrated about it.    Objective: Vitals:   12/11/21 0521 12/11/21 2019 12/11/21 2200 12/12/21 0608  BP: 140/67 (!) 156/92  (!) 155/71  Pulse: (!) 109 (!) 110  (!) 101  Resp: 20 20 18 20   Temp: (!) 97.4 F (36.3 C) 98.1 F (36.7 C)  97.7 F (36.5 C)  TempSrc: Oral Oral  Oral  SpO2: 96% 100%  97%  Weight:      Height:        Intake/Output Summary (Last 24 hours) at 12/12/2021 1218 Last data filed at 12/12/2021 0526 Gross per 24 hour  Intake --  Output 2000 ml  Net -2000 ml    Filed Weights   11/14/21 0835 11/15/21 1608 11/25/21 1342  Weight: 60 kg 60 kg 60 kg    Physical examination:  General:  Average built, in mild distress, anxious, HENT:   No scleral pallor or icterus noted. Oral mucosa is moist.  Chest:  Clear breath sounds.  Diminished breath sounds bilaterally. No crackles or wheezes.  CVS: S1 &S2 heard. No murmur.  Regular rate and rhythm. Abdomen: Soft, nontender, nondistended.  Bowel sounds are heard.   Extremities: No cyanosis, clubbing or edema.  Peripheral pulses are palpable.  Left wrist and right upper extremity wrapped in dressing.  Right upper extremity wound VAC. Psych: Alert, awake and oriented, normal mood CNS:  No cranial nerve deficits.  Power equal in all extremities.   Skin: Warm and dry.  No rashes noted.  Left wrist and right upper extremity wrapped in dressing  Data Reviewed: I have personally reviewed following labs and imaging studies  CBC: Recent Labs  Lab 12/05/21 1400 12/06/21 0507 12/07/21 0519 12/08/21 0616 12/09/21 0552 12/10/21 0909 12/11/21 0842 12/12/21 0554  WBC 10.4 8.7 8.4 11.5* 10.0 12.6* 11.1* 10.2  NEUTROABS 6.5 4.6 4.6  --   --   --   --   --   HGB 7.8* 7.5* 7.4* 7.2* 9.8* 9.5* 9.7* 9.6*  HCT 26.1* 25.2* 24.2* 24.3* 31.7* 30.8* 32.4*  31.4*  MCV 88.8 89.0 88.0 88.0 86.8 87.5 88.3 88.2  PLT 521* 527* 499* 637* 709* 747* 722* 652*    Basic Metabolic Panel: Recent Labs  Lab 12/05/21 1400 12/06/21 0507 12/07/21 0519 12/08/21 0616 12/10/21 0909 12/11/21 0842 12/12/21 0554  NA 132* 138 136 137 134* 131* 135  K 3.8 3.7 4.1 4.1 3.8 3.5 3.8  CL 102 106 104 100 100 101 102  CO2 25 27 22 25 24 24 25   GLUCOSE 98 104* 121* 116* 100* 119* 113*  BUN 14 15 22* 16 16 18 17   CREATININE 0.46* 0.49* 0.44* 0.45* 0.47* 0.48* 0.40*  CALCIUM 8.2* 8.4* 8.5* 8.5* 8.6* 8.5* 8.7*  MG 1.7 1.8 1.6* 1.9  --   --   --   PHOS 4.4 5.5* 5.1* 4.7*  --   --   --     GFR: Estimated Creatinine Clearance: 109.4 mL/min (A) (by C-G formula based on SCr of 0.4 mg/dL (L)).  Liver Function Tests: Recent Labs  Lab 12/05/21 1400 12/06/21 0507 12/07/21 0519  AST 22 21 18   ALT 25 22 19   ALKPHOS 105 100 89  BILITOT 0.4 0.3 0.3  PROT 7.6 6.9 6.9  ALBUMIN 2.8* 2.4* 2.5*    Cardiac Enzymes: Recent Labs  Lab 12/09/21 0552  CKTOTAL 20*    Scheduled Meds:  (feeding supplement) PROSource Plus  30 mL Oral TID BM   vitamin C  500 mg Oral Daily   Chlorhexidine Gluconate Cloth  6 each Topical Daily   feeding supplement  237 mL Oral BID BM   ferrous sulfate  325 mg Oral Q breakfast   gabapentin  300 mg Oral TID   nicotine  21 mg Transdermal Daily   pantoprazole  40 mg Oral BID   polyethylene glycol  17 g Oral BID   QUEtiapine  100 mg Oral BID   Continuous Infusions:  DAPTOmycin (CUBICIN)  IV 500 mg (12/11/21 2049)     LOS: 28 days    Flora Lipps, MD [facility] 12/12/2021, 12:18 PM  Available via Epic secure chat 7am-7pm After these hours, please refer to coverage provider listed on [email or web address]

## 2021-12-12 NOTE — Progress Notes (Signed)
Patient is refuse ordered wound care. He states that "it was done earlier today". When educated on the importance of the ordered care he responded saying he doesn't want to be touched because he is hurting too much since his pain meds were decreased. Patient was educated on wound care and pain management and states that he understands.

## 2021-12-13 DIAGNOSIS — L03113 Cellulitis of right upper limb: Secondary | ICD-10-CM | POA: Diagnosis not present

## 2021-12-13 DIAGNOSIS — L0390 Cellulitis, unspecified: Secondary | ICD-10-CM | POA: Diagnosis not present

## 2021-12-13 DIAGNOSIS — F15951 Other stimulant use, unspecified with stimulant-induced psychotic disorder with hallucinations: Secondary | ICD-10-CM | POA: Diagnosis not present

## 2021-12-13 DIAGNOSIS — D62 Acute posthemorrhagic anemia: Secondary | ICD-10-CM | POA: Diagnosis not present

## 2021-12-13 LAB — BASIC METABOLIC PANEL
Anion gap: 7 (ref 5–15)
BUN: 14 mg/dL (ref 6–20)
CO2: 24 mmol/L (ref 22–32)
Calcium: 8.5 mg/dL — ABNORMAL LOW (ref 8.9–10.3)
Chloride: 103 mmol/L (ref 98–111)
Creatinine, Ser: 0.41 mg/dL — ABNORMAL LOW (ref 0.61–1.24)
GFR, Estimated: >60 mL/min (ref >60)
Glucose, Bld: 119 mg/dL — ABNORMAL HIGH (ref 70–99)
Potassium: 3.8 mmol/L (ref 3.5–5.1)
Sodium: 134 mmol/L — ABNORMAL LOW (ref 135–145)

## 2021-12-13 LAB — CBC
HCT: 30.9 % — ABNORMAL LOW (ref 39.0–52.0)
Hemoglobin: 9.5 g/dL — ABNORMAL LOW (ref 13.0–17.0)
MCH: 27.1 pg (ref 26.0–34.0)
MCHC: 30.7 g/dL (ref 30.0–36.0)
MCV: 88.3 fL (ref 80.0–100.0)
Platelets: 651 10*3/uL — ABNORMAL HIGH (ref 150–400)
RBC: 3.5 MIL/uL — ABNORMAL LOW (ref 4.22–5.81)
RDW: 15.8 % — ABNORMAL HIGH (ref 11.5–15.5)
WBC: 11.3 10*3/uL — ABNORMAL HIGH (ref 4.0–10.5)
nRBC: 0 % (ref 0.0–0.2)

## 2021-12-13 MED ORDER — ACETAMINOPHEN 500 MG PO TABS
500.0000 mg | ORAL_TABLET | Freq: Four times a day (QID) | ORAL | Status: DC
  Administered 2021-12-13 – 2022-01-18 (×125): 500 mg via ORAL
  Filled 2021-12-13 (×126): qty 1

## 2021-12-13 NOTE — Consult Note (Addendum)
WOC Nurse Consult Note: Consult received from Bedside nurse questioning if we could put photos of this patients wounds in the chart. The WOC team does not have the capability of taking photos to put in the chart. This would have to be completed by an Advance Practice Provider or an MD that has access to [facility]. This patient also has a wound vac in place, however the WOC team has not been consulted to change these dressings. According to progress notes Dr. Izora Ribas of the hand surgery team applied the vac in the OR on 12/08/21 and is following for assessment and POC. Please contact him for any further questions. If MD needs WOC assistance for this he/she should consult WOC specifically for NPWT dressing changes.  Jason Reel Katrinka Blazing, MSN, Jason Buchanan, Jason Buchanan, Jason Buchanan, Jason Buchanan Wound Treatment Associate Pager [phone]

## 2021-12-13 NOTE — Progress Notes (Signed)
S: pt with no new complaints.  O:Blood pressure 139/85, pulse (!) 109, temperature 97.9 F (36.6 C), temperature source Oral, resp. rate 16, height 5\' 7"  (1.702 m), weight 60 kg, SpO2 100 %.   LUE wound: good granulation tissue, no areas of obvious infection; RUE wounds:  Vac in place; upper arm wound with good granulation tissue no evidence of active infection  A:  s/p multiple bilateral I&D's - infection  P: LUE wound :may clean in shower or with soap / water, ocver open area with small piece of zeroform gauze, cont rom of fingers/wrist; RUE cont moist to dry dressing changes upper arm wound Q shift, cont VAC for now - f/u with plastics for ? Graft Abx and other medical care per medical team.

## 2021-12-13 NOTE — Progress Notes (Signed)
PROGRESS NOTE    Jason Buchanan  [id number] DOB: [birth year] DOA: 11/14/2021 PCP: [name], NP     Brief Narrative:   36 year old male with medical history significant for polysubstance abuse including intravenous drug abuse presented to hospital with bilateral arm pain from injection to arms bilaterally.  CT scan of the upper extremity showed extensive cellulitis and was initially started on broad-spectrum antibiotic.  After hospitalization, patient has undergone multiple bilateral upper extremity I&D's by hand surgery and plastic surgery.  Blood culture was also positive for beta Streptococcus.  On 11/07/2021 patient developed melena with active GI bleed requiring PRBC transfusion.  GI was consulted and patient underwent EGD confirming mild proximal mild esophagitis with nonbleeding ulcers, nonbleeding duodenal ulcers which were injected.  Post procedure he developed respite failure requiring intubation and mechanical ventilation and was subsequently extubated on 11/17/2021.  Patient was subsequently considered stable for transfer out of the ICU and is currently in the hospital service.    Assessment & Plan:    Sepsis due to cellulitis with intractable pain.  Continue daptomycin until discharge.  Zyvox once the patient goes home.  Patient has been seen by plastic surgery, orthopedics and hand surgery.  Currently on Dilaudid,Tylenol and oxycodone.  On IV Dilaudid at this time.  Encouraged oral medications for transition, continue gabapentin.  Last I&D was done on 12/08/2021 by Dr. Lenon Curt hand surgery.  Follow surgical recommendations.             Polysubstance abuse  History of IV drug abuse.  He states that he is going to go to drug treatment program after the hospitalization.    Hypokalemia Latest potassium of 3.8.     Streptococcal bacteremia         ID has seen the patient, patient had received antibiotic.  No plan for TEE.     Right arm cellulitis Status post I&D and surgery by  plastic surgery orthopedic and hand surgery.  Follow surgical recommendation.     Staphylococcus aureus infection Currently on daptomycin, pharmacy to dose until discharge.  Patient will need to continue Zyvox 600 mg twice daily for 30 days per ID once going home.  Will need to revisit with ID prior to discharge    Acute blood loss anemia due to upper GI bleed. Hold Toradol and NSAIDs.  Continue to monitor symptoms.   Latest hemoglobin of 9.5.         Postoperative respiratory failure.             Resolved at this time.     Amphetamine and psychostimulant-induced psychosis with hallucinations           Continue Seroquel and trazodone.     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    DVT prophylaxis: SCDs  Code Status: Full code  Family Communication:  Communicated with the patient at bedside  Disposition Plan: Home when medically stable and okay with surgery..  Barriers to Discharge: Medical improvement   Consultants:  Hand surgery Plastic surgery Orthopedic surgery PCCM Wound Care Psychiatry GI   Procedures:  12/08/21- I&D and debridement of LUE and L hand 12/07/21- Wound vac change of RUE, I&D of deep abscess of  R upper arm 12/03/21- I&D 12/01/21- I&D Incision and drainage and debridement right upper extremity/incision and drainage of left upper extremity by Dr. Lenon Curt hand surgeon 11/23/2021. Wound evaluation left upper extremity with dressing change under  sedation/incision and drainage right upper extremity and loose closure of wound, right upper extremity per Dr. Lenon Curt, hand surgeon 11/18/2021 Right hand fasciotomy/right hand carpal tunnel release/right hand renal scan no release/right forearm 3 compartment fasciotomy/thorough irrigation of hand and forearm/dorsal hand incision and drainage including extensor tendon sheath per Dr. Lenon Curt, hand surgeon  11/17/2021 Central line insertion per PCCM, Dr. Tamala Julian 11/17/2021 Upper endoscopy per GI, Dr. Watt Climes 11/17/2021 Left wrist deep abscess incision and drainage/left volar forearm fasciotomy/left wrist carpal tunnel release extended with Guyon's canal release--per Dr. Greta Doom orthopedics 11/15/2021  Antimicrobials:  None  Anti-infectives (From admission, onward)    Start     Dose/Rate Route Frequency Ordered Stop   11/17/21 1700  cefTRIAXone (ROCEPHIN) 2 g in sodium chloride 0.9 % 100 mL IVPB  Status:  Discontinued        2 g 200 mL/hr over 30 Minutes Intravenous Every 24 hours 11/17/21 1019 11/21/21 1047   11/17/21 1700  DAPTOmycin (CUBICIN) 500 mg in sodium chloride 0.9 % IVPB        8 mg/kg  60 kg 120 mL/hr over 30 Minutes Intravenous Daily 11/17/21 1555     11/17/21 1200  metroNIDAZOLE (FLAGYL) IVPB 500 mg  Status:  Discontinued        500 mg 100 mL/hr over 60 Minutes Intravenous Every 12 hours 11/17/21 1019 11/18/21 0924   11/15/21 1000  cefTRIAXone (ROCEPHIN) 2 g in sodium chloride 0.9 % 100 mL IVPB  Status:  Discontinued        2 g 200 mL/hr over 30 Minutes Intravenous Every 24 hours 11/14/21 1834 11/15/21 0405   11/15/21 1000  clindamycin (CLEOCIN) IVPB 600 mg  Status:  Discontinued        600 mg 100 mL/hr over 30 Minutes Intravenous Every 8 hours 11/15/21 0405 11/15/21 1523   11/15/21 0800  penicillin G potassium 12 Million Units in dextrose 5 % 500 mL continuous infusion  Status:  Discontinued        12 Million Units 41.7 mL/hr over 12 Hours Intravenous Every 12 hours 11/15/21 0405 11/17/21 1956   11/14/21 2200  vancomycin (VANCOREADY) IVPB 750 mg/150 mL  Status:  Discontinued        750 mg 150 mL/hr over 60 Minutes Intravenous Every 12 hours 11/14/21 1524 11/15/21 0405   11/14/21 0900  vancomycin (VANCOCIN) IVPB 1000 mg/200 mL premix        1,000 mg 200 mL/hr over 60 Minutes Intravenous  Once 11/14/21 0849 11/14/21 1043   11/14/21 0900  cefTRIAXone (ROCEPHIN) 2 g in sodium  chloride 0.9 % 100 mL IVPB        2 g 200 mL/hr over 30 Minutes Intravenous  Once 11/14/21 0849 11/14/21 0935      Subjective: Today, patient was seen and examined at bedside.  Patient complains of bloating been doing little better.  Denies any nausea vomiting fever chills or rigor.  Objective: Vitals:   12/12/21 0608 12/12/21 1451 12/12/21 1959 12/13/21 0353  BP: (!) 155/71 (!) 149/82 (!) 142/70 133/71  Pulse: (!) 101 94 100 100  Resp: 20 16 20 20   Temp: 97.7 F (36.5 C) 97.9 F (36.6 C) 98 F (36.7 C) 97.8 F (36.6 C)  TempSrc: Oral Oral Oral Oral  SpO2: 97% 99% 99% 98%  Weight:      Height:        Intake/Output Summary (Last 24 hours) at 12/13/2021 1035 Last data filed at 12/13/2021 0700 Gross per 24 hour  Intake 2080  ml  Output 3400 ml  Net -1320 ml    Filed Weights   11/14/21 0835 11/15/21 1608 11/25/21 1342  Weight: 60 kg 60 kg 60 kg    Physical examination:   General:  Average built, not in obvious distress, anxious HENT:   No scleral pallor or icterus noted. Oral mucosa is moist.  Chest:  Clear breath sounds.  Diminished breath sounds bilaterally. No crackles or wheezes.  CVS: S1 &S2 heard. No murmur.  Regular rate and rhythm. Abdomen: Soft, nontender, nondistended.  Bowel sounds are heard.   Extremities: No cyanosis, clubbing or edema.  Peripheral pulses are palpable.  Left wrist in dressing, right upper extremity wrapped in dressing.  Right upper extremity wound VAC in place Psych: Alert, awake and oriented, normal mood CNS:  No cranial nerve deficits.  Power equal in all extremities.   Skin: Warm and dry.  No rashes noted.  Left wrist with dressing, right upper extremity wrapped in dressing   Data Reviewed: I have personally reviewed following labs and imaging studies  CBC: Recent Labs  Lab 12/07/21 0519 12/08/21 0616 12/09/21 0552 12/10/21 0909 12/11/21 0842 12/12/21 0554 12/13/21 0438  WBC 8.4   < > 10.0 12.6* 11.1* 10.2 11.3*  NEUTROABS 4.6   --   --   --   --   --   --   HGB 7.4*   < > 9.8* 9.5* 9.7* 9.6* 9.5*  HCT 24.2*   < > 31.7* 30.8* 32.4* 31.4* 30.9*  MCV 88.0   < > 86.8 87.5 88.3 88.2 88.3  PLT 499*   < > 709* 747* 722* 652* 651*   < > = values in this interval not displayed.    Basic Metabolic Panel: Recent Labs  Lab 12/07/21 0519 12/08/21 0616 12/10/21 0909 12/11/21 0842 12/12/21 0554 12/13/21 0438  NA 136 137 134* 131* 135 134*  K 4.1 4.1 3.8 3.5 3.8 3.8  CL 104 100 100 101 102 103  CO2 22 25 24 24 25 24   GLUCOSE 121* 116* 100* 119* 113* 119*  BUN 22* 16 16 18 17 14   CREATININE 0.44* 0.45* 0.47* 0.48* 0.40* 0.41*  CALCIUM 8.5* 8.5* 8.6* 8.5* 8.7* 8.5*  MG 1.6* 1.9  --   --   --   --   PHOS 5.1* 4.7*  --   --   --   --     GFR: Estimated Creatinine Clearance: 109.4 mL/min (A) (by C-G formula based on SCr of 0.41 mg/dL (L)).  Liver Function Tests: Recent Labs  Lab 12/07/21 0519  AST 18  ALT 19  ALKPHOS 89  BILITOT 0.3  PROT 6.9  ALBUMIN 2.5*    Cardiac Enzymes: Recent Labs  Lab 12/09/21 0552  CKTOTAL 20*    Scheduled Meds:  (feeding supplement) PROSource Plus  30 mL Oral TID BM   vitamin C  500 mg Oral Daily   Chlorhexidine Gluconate Cloth  6 each Topical Daily   feeding supplement  237 mL Oral BID BM   ferrous sulfate  325 mg Oral Q breakfast   gabapentin  300 mg Oral TID   nicotine  21 mg Transdermal Daily   pantoprazole  40 mg Oral BID   polyethylene glycol  17 g Oral BID   QUEtiapine  100 mg Oral BID   Continuous Infusions:  DAPTOmycin (CUBICIN)  IV 500 mg (12/12/21 2119)     LOS: 29 days    Flora Lipps, MD [facility] 12/13/2021, 10:35 AM

## 2021-12-13 NOTE — Plan of Care (Signed)
  Problem: Education: Goal: Knowledge of General Education information will improve Description Including pain rating scale, medication(s)/side effects and non-pharmacologic comfort measures Outcome: Progressing   Problem: Health Behavior/Discharge Planning: Goal: Ability to manage health-related needs will improve Outcome: Progressing   

## 2021-12-14 ENCOUNTER — Inpatient Hospital Stay (HOSPITAL_COMMUNITY): Payer: 59 | Admitting: Anesthesiology

## 2021-12-14 ENCOUNTER — Encounter (HOSPITAL_COMMUNITY)
Admission: EM | Disposition: A | Discharge status: Home / Self Care | Payer: Self-pay | Source: Home / Self Care | Attending: Internal Medicine

## 2021-12-14 DIAGNOSIS — S41101A Unspecified open wound of right upper arm, initial encounter: Secondary | ICD-10-CM

## 2021-12-14 DIAGNOSIS — L03113 Cellulitis of right upper limb: Secondary | ICD-10-CM

## 2021-12-14 DIAGNOSIS — D62 Acute posthemorrhagic anemia: Secondary | ICD-10-CM | POA: Diagnosis not present

## 2021-12-14 DIAGNOSIS — F15951 Other stimulant use, unspecified with stimulant-induced psychotic disorder with hallucinations: Secondary | ICD-10-CM | POA: Diagnosis not present

## 2021-12-14 DIAGNOSIS — L0390 Cellulitis, unspecified: Secondary | ICD-10-CM | POA: Diagnosis not present

## 2021-12-14 HISTORY — PX: APPLICATION OF WOUND VAC: SHX5189

## 2021-12-14 HISTORY — PX: INCISION AND DRAINAGE OF WOUND: SHX1803

## 2021-12-14 LAB — BASIC METABOLIC PANEL
Anion gap: 10 (ref 5–15)
BUN: 15 mg/dL (ref 6–20)
CO2: 22 mmol/L (ref 22–32)
Calcium: 8.5 mg/dL — ABNORMAL LOW (ref 8.9–10.3)
Chloride: 104 mmol/L (ref 98–111)
Creatinine, Ser: 0.45 mg/dL — ABNORMAL LOW (ref 0.61–1.24)
GFR, Estimated: >60 mL/min (ref >60)
Glucose, Bld: 97 mg/dL (ref 70–99)
Potassium: 3.9 mmol/L (ref 3.5–5.1)
Sodium: 136 mmol/L (ref 135–145)

## 2021-12-14 LAB — CBC
HCT: 34.2 % — ABNORMAL LOW (ref 39.0–52.0)
Hemoglobin: 10.4 g/dL — ABNORMAL LOW (ref 13.0–17.0)
MCH: 26.9 pg (ref 26.0–34.0)
MCHC: 30.4 g/dL (ref 30.0–36.0)
MCV: 88.4 fL (ref 80.0–100.0)
Platelets: 616 10*3/uL — ABNORMAL HIGH (ref 150–400)
RBC: 3.87 MIL/uL — ABNORMAL LOW (ref 4.22–5.81)
RDW: 15.7 % — ABNORMAL HIGH (ref 11.5–15.5)
WBC: 11.2 10*3/uL — ABNORMAL HIGH (ref 4.0–10.5)
nRBC: 0 % (ref 0.0–0.2)

## 2021-12-14 LAB — GLUCOSE, CAPILLARY: Glucose-Capillary: 102 mg/dL — ABNORMAL HIGH (ref 70–99)

## 2021-12-14 SURGERY — IRRIGATION AND DEBRIDEMENT WOUND
Anesthesia: General | Site: Arm Lower | Laterality: Right

## 2021-12-14 MED ORDER — DEXAMETHASONE SODIUM PHOSPHATE 10 MG/ML IJ SOLN
INTRAMUSCULAR | Status: AC
  Filled 2021-12-14: qty 1

## 2021-12-14 MED ORDER — OXYCODONE HCL 5 MG PO TABS
ORAL_TABLET | ORAL | Status: AC
  Filled 2021-12-14: qty 3

## 2021-12-14 MED ORDER — FENTANYL CITRATE (PF) 100 MCG/2ML IJ SOLN
INTRAMUSCULAR | Status: DC | PRN
  Administered 2021-12-14: 100 ug via INTRAVENOUS

## 2021-12-14 MED ORDER — ISOPROPYL ALCOHOL 70 % SOLN
Status: AC
  Filled 2021-12-14: qty 480

## 2021-12-14 MED ORDER — ACETAMINOPHEN 10 MG/ML IV SOLN: 1000.0000 mg | Freq: Once | INTRAVENOUS | Status: DC | PRN

## 2021-12-14 MED ORDER — LACTATED RINGERS IV SOLN: INTRAVENOUS | Status: DC

## 2021-12-14 MED ORDER — MIDAZOLAM HCL 2 MG/2ML IJ SOLN
INTRAMUSCULAR | Status: AC
  Filled 2021-12-14: qty 2

## 2021-12-14 MED ORDER — CHLORHEXIDINE GLUCONATE CLOTH 2 % EX PADS
6.0000 | MEDICATED_PAD | Freq: Once | CUTANEOUS | Status: AC
  Administered 2021-12-14: 6 via TOPICAL

## 2021-12-14 MED ORDER — EPINEPHRINE PF 1 MG/ML IJ SOLN
INTRAMUSCULAR | Status: DC | PRN
  Administered 2021-12-14: 1 mg

## 2021-12-14 MED ORDER — FENTANYL CITRATE PF 50 MCG/ML IJ SOSY
25.0000 ug | PREFILLED_SYRINGE | INTRAMUSCULAR | Status: DC | PRN
  Administered 2021-12-14 (×3): 50 ug via INTRAVENOUS

## 2021-12-14 MED ORDER — FENTANYL CITRATE PF 50 MCG/ML IJ SOSY
PREFILLED_SYRINGE | INTRAMUSCULAR | Status: AC
  Filled 2021-12-14: qty 1

## 2021-12-14 MED ORDER — EPINEPHRINE PF 1 MG/ML IJ SOLN
INTRAMUSCULAR | Status: AC
  Filled 2021-12-14: qty 1

## 2021-12-14 MED ORDER — OXYCODONE HCL 5 MG/5ML PO SOLN
5.0000 mg | Freq: Once | ORAL | Status: DC | PRN
Start: 2021-12-14 — End: 2021-12-14

## 2021-12-14 MED ORDER — SODIUM CHLORIDE 0.9 % IR SOLN
Status: DC | PRN
Start: 2021-12-14 — End: 2021-12-14
  Administered 2021-12-14: 3000 mL

## 2021-12-14 MED ORDER — KETAMINE HCL 10 MG/ML IJ SOLN
INTRAMUSCULAR | Status: AC
  Filled 2021-12-14: qty 1

## 2021-12-14 MED ORDER — ONDANSETRON HCL 4 MG/2ML IJ SOLN
INTRAMUSCULAR | Status: AC
  Filled 2021-12-14: qty 2

## 2021-12-14 MED ORDER — ONDANSETRON HCL 4 MG/2ML IJ SOLN: 4.0000 mg | Freq: Once | INTRAMUSCULAR | Status: DC | PRN

## 2021-12-14 MED ORDER — FENTANYL CITRATE PF 50 MCG/ML IJ SOSY
PREFILLED_SYRINGE | INTRAMUSCULAR | Status: AC
  Filled 2021-12-14: qty 2

## 2021-12-14 MED ORDER — ONDANSETRON HCL 4 MG/2ML IJ SOLN
INTRAMUSCULAR | Status: DC | PRN
  Administered 2021-12-14: 4 mg via INTRAVENOUS

## 2021-12-14 MED ORDER — PROPOFOL 10 MG/ML IV BOLUS
INTRAVENOUS | Status: AC
  Filled 2021-12-14: qty 20

## 2021-12-14 MED ORDER — FENTANYL CITRATE (PF) 100 MCG/2ML IJ SOLN
INTRAMUSCULAR | Status: AC
  Filled 2021-12-14: qty 2

## 2021-12-14 MED ORDER — PROPOFOL 500 MG/50ML IV EMUL
INTRAVENOUS | Status: DC | PRN
  Administered 2021-12-14: 200 mg via INTRAVENOUS

## 2021-12-14 MED ORDER — MIDAZOLAM HCL 2 MG/2ML IJ SOLN
INTRAMUSCULAR | Status: DC | PRN
  Administered 2021-12-14: 2 mg via INTRAVENOUS

## 2021-12-14 MED ORDER — DEXAMETHASONE SODIUM PHOSPHATE 10 MG/ML IJ SOLN
INTRAMUSCULAR | Status: DC | PRN
  Administered 2021-12-14: 10 mg via INTRAVENOUS

## 2021-12-14 MED ORDER — OXYCODONE HCL 5 MG PO TABS: 5.0000 mg | ORAL_TABLET | Freq: Once | ORAL | Status: DC | PRN

## 2021-12-14 MED ORDER — KETAMINE HCL 10 MG/ML IJ SOLN
INTRAMUSCULAR | Status: DC | PRN
  Administered 2021-12-14: 30 mg via INTRAVENOUS

## 2021-12-14 SURGICAL SUPPLY — 39 items
BAG COUNTER SPONGE SURGICOUNT (BAG) IMPLANT
BNDG ELASTIC 4X5.8 VLCR STR LF (GAUZE/BANDAGES/DRESSINGS) ×2 IMPLANT
BNDG GAUZE ELAST 4 BULKY (GAUZE/BANDAGES/DRESSINGS) IMPLANT
CNTNR URN SCR LID CUP LEK RST (MISCELLANEOUS) ×1 IMPLANT
CONT SPEC 4OZ STRL OR WHT (MISCELLANEOUS) ×2
DECANTER SPIKE VIAL GLASS SM (MISCELLANEOUS) IMPLANT
DRAPE INCISE IOBAN 66X45 STRL (DRAPES) ×2 IMPLANT
DRAPE LAPAROTOMY T 102X78X121 (DRAPES) ×2 IMPLANT
DRAPE UTILITY XL STRL (DRAPES) ×2 IMPLANT
DRSG ADAPTIC 3X8 NADH LF (GAUZE/BANDAGES/DRESSINGS) ×2 IMPLANT
DRSG HYDROCOLLOID 4X4 (GAUZE/BANDAGES/DRESSINGS) IMPLANT
DRSG MEPITEL 8X12 (GAUZE/BANDAGES/DRESSINGS) ×1 IMPLANT
DRSG PAD ABDOMINAL 8X10 ST (GAUZE/BANDAGES/DRESSINGS) IMPLANT
DRSG VAC ATS LRG SENSATRAC (GAUZE/BANDAGES/DRESSINGS) ×1 IMPLANT
ELECT REM PT RETURN 15FT ADLT (MISCELLANEOUS) ×2 IMPLANT
GAUZE SPONGE 4X4 12PLY STRL (GAUZE/BANDAGES/DRESSINGS) ×2 IMPLANT
GLOVE SURG ENC MOIS LTX SZ6.5 (GLOVE) ×2 IMPLANT
GOWN STRL REUS W/TWL LRG LVL3 (GOWN DISPOSABLE) ×4 IMPLANT
GRAFT MYRIAD 3 LAYER 10X20 (Graft) ×1 IMPLANT
HANDPIECE INTERPULSE COAX TIP (DISPOSABLE)
KIT BASIN OR (CUSTOM PROCEDURE TRAY) ×2 IMPLANT
KIT TURNOVER KIT A (KITS) IMPLANT
NEEDLE HYPO 22GX1.5 SAFETY (NEEDLE) IMPLANT
PACK GENERAL/GYN (CUSTOM PROCEDURE TRAY) ×2 IMPLANT
PAD CAST 4YDX4 CTTN HI CHSV (CAST SUPPLIES) ×1 IMPLANT
PADDING CAST COTTON 4X4 STRL (CAST SUPPLIES) ×2
POWDER MYRIAD MORCELLS 1000MG (Miscellaneous) ×1 IMPLANT
SET HNDPC FAN SPRY TIP SCT (DISPOSABLE) IMPLANT
SET IRRIG Y TYPE TUR BLADDER L (SET/KITS/TRAYS/PACK) ×1 IMPLANT
SOL SCRUB PVP POV-IOD 4OZ 7.5% (MISCELLANEOUS) ×2
SOLUTION SCRB POV-IOD 4OZ 7.5% (MISCELLANEOUS) ×1 IMPLANT
STAPLER VISISTAT 35W (STAPLE) ×2 IMPLANT
SUT MNCRL AB 4-0 PS2 18 (SUTURE) ×2 IMPLANT
SUT SILK 4 0 PS 2 (SUTURE) IMPLANT
SUT VIC AB 5-0 PS2 18 (SUTURE) IMPLANT
SWAB COLLECTION DEVICE MRSA (MISCELLANEOUS) IMPLANT
SWAB CULTURE ESWAB REG 1ML (MISCELLANEOUS) ×2 IMPLANT
SYR CONTROL 10ML LL (SYRINGE) IMPLANT
TOWEL OR 17X26 10 PK STRL BLUE (TOWEL DISPOSABLE) ×2 IMPLANT

## 2021-12-14 NOTE — Anesthesia Postprocedure Evaluation (Signed)
Anesthesia Post Note  Patient: Jason Buchanan  Procedure(s) Performed: IRRIGATION AND DEBRIDEMENT RIGHT ARM (Right: Arm Lower) APPLICATION OF WOUND VAC (Right: Arm Lower) APPLICATION OF MYRIAD, ACELL OR INTEGRA (Right: Arm Lower)     Patient location during evaluation: PACU Anesthesia Type: General Level of consciousness: awake and alert Pain management: pain level controlled Vital Signs Assessment: post-procedure vital signs reviewed and stable Respiratory status: spontaneous breathing, nonlabored ventilation, respiratory function stable and patient connected to nasal cannula oxygen Cardiovascular status: blood pressure returned to baseline and stable Postop Assessment: no apparent nausea or vomiting Anesthetic complications: no   No notable events documented.  Last Vitals:  Vitals:   12/14/21 1800 12/14/21 1815  BP: (!) 111/56 (!) 116/56  Pulse: (!) 114 (!) 112  Resp: 15 12  Temp:    SpO2: 100% 100%    Last Pain:  Vitals:   12/14/21 1815  TempSrc:   PainSc: Asleep                 Josanna Hefel S

## 2021-12-14 NOTE — Progress Notes (Signed)
Nutrition Follow-up  DOCUMENTATION CODES:   Severe malnutrition in context of acute illness/injury  INTERVENTION:   -Ensure Enlive po TID, each supplement provides 350 kcal and 20 grams of protein    -Multivitamin with minerals daily   -500 mg Vitamin daily   -Prosource Plus PO TID, each provides 100 kcals and 15g protein   NUTRITION DIAGNOSIS:   Severe Malnutrition related to acute illness, wound healing as evidenced by severe fat depletion, severe muscle depletion.  Ongoing  GOAL:   Patient will meet greater than or equal to 90% of their needs  Progressing.  MONITOR:   PO intake, Supplement acceptance, Labs, Weight trends, I & O's, Skin  ASSESSMENT:   36 year old man with history of Polysubstance abuse who presented to the hospital with arm swelling, erythema and pain. Patient is a poor historian but  reports history of injecting multiple drugs in both arms and currently receiving treatment for severe sepsis of right upper extremities complicated with streptococcus bacteremia.  12/12 admitted, NPO ->HH diet 12/13: NPO, s/p I&D of left wrist deep abscess, left volar forearm fasciotomy, left wrist carpal tunnel release 12/14: Regular ->NPO 12/15: s/p right hand fasciotomy, right hand carpal tunnel release, right forearm fasciotomy, I&D of hand and forearm, s/p EGD -esophagitis, gastric and duodenal ulcers 12/17: regular diet 12/20: I&D of BUEs 12/23: I&D of bilateral arms 12/29: I&D of bilateral arms w/ Wound VAC  1/4: Wound VAC change, I&D of deep abscess of rt arm 1/5: I&D of LUE, left hand  Patient NPO again today for further surgical interventions. Pt has been consuming 75-100% of his meals. Accepts all of his protein supplements.   Admission weight: 132 lbs.  Medications: Vitamin C, Ferrous sulfate, Miralax  Labs reviewed:  CBGs: 102  Diet Order:   Diet Order             Diet NPO time specified  Diet effective now                   EDUCATION  NEEDS:   Education needs have been addressed  Skin:  Skin Assessment: Skin Integrity Issues: Skin Integrity Issues:: Incisions Incisions: 12/13: bilateral arms  12/15: right arm 12/20 & 12/23: bilateral arms, 12/29: left arm, 1/4: rt upper arm  Last BM:  1/8  Height:   Ht Readings from Last 1 Encounters:  11/25/21 5\' 7"  (1.702 m)    Weight:   Wt Readings from Last 1 Encounters:  11/25/21 60 kg    BMI:  Body mass index is 20.72 kg/m.  Estimated Nutritional Needs:   Kcal:  1800-2000  Protein:  95-110g  Fluid:  2L/day   11/27/21, MS, RD, LDN Inpatient Clinical Dietitian Contact information available via Amion

## 2021-12-14 NOTE — Progress Notes (Signed)
RN went to change pt's dressings and her refused saying previous RN just changed them. Upon morning report, RN stated she did not change dressings yesterday, MD did. Pt did not allow PM RN to change dressing.

## 2021-12-14 NOTE — Progress Notes (Signed)
Patient refused bilateral dressing changes. States "I don't need a dressing change, I'm going to surgery."

## 2021-12-14 NOTE — Interval H&P Note (Signed)
History and Physical Interval Note:  12/14/2021 3:11 PM  Jason Buchanan  has presented today for surgery, with the diagnosis of RIGHT ARM CELLULITIS.  The various methods of treatment have been discussed with the patient and family. After consideration of risks, benefits and other options for treatment, the patient has consented to  Procedure(s): IRRIGATION AND DEBRIDEMENT RIGHT ARM (Right) APPLICATION OF WOUND VAC (Right) APPLICATION OF MYRIAD, ACELL OR INTEGRA (Right) as a surgical intervention.  The patient's history has been reviewed, patient examined, no change in status, stable for surgery.  I have reviewed the patient's chart and labs.  Questions were answered to the patient's satisfaction.     [name]

## 2021-12-14 NOTE — Anesthesia Procedure Notes (Signed)
Procedure Name: LMA Insertion Date/Time: 12/14/2021 4:25 PM Performed by: Basilio Cairo, CRNA Pre-anesthesia Checklist: Patient identified, Patient being monitored, Timeout performed, Emergency Drugs available and Suction available Patient Re-evaluated:Patient Re-evaluated prior to induction Oxygen Delivery Method: Circle system utilized Preoxygenation: Pre-oxygenation with 100% oxygen Induction Type: IV induction Ventilation: Mask ventilation without difficulty LMA: LMA inserted LMA Size: 4.0 Tube type: Oral Number of attempts: 1 Placement Confirmation: positive ETCO2 and breath sounds checked- equal and bilateral Tube secured with: Tape Dental Injury: Teeth and Oropharynx as per pre-operative assessment

## 2021-12-14 NOTE — Op Note (Signed)
Operative Note   DATE OF OPERATION: 12/14/2021  SURGICAL DEPARTMENT: Plastic Surgery  PREOPERATIVE DIAGNOSES: Right arm open wound with exposed tendons.  POSTOPERATIVE DIAGNOSES:  same  PROCEDURE:   1) debridement with preparation for skin graft 21 x 8 (168 cm2) centimeters and 8 x 2 (16cm2) right arm  2) placement of Myriad 21x8 cm and 8x2 cm and 1 G of Myriad Morcells to right arm 3) application of wound vac dressing to right arm 184 cm2  SURGEON: Damien Cisar P. Conchita Truxillo, MD  ASSISTANT: Roetta Sessions, PA  ANESTHESIA:  General.   COMPLICATIONS: None.   INDICATIONS FOR PROCEDURE:  The patient, Jason Buchanan is a 36 y.o. male born on 11-25-1986, is here for treatment of right arm open wound MRN: [id number]  CONSENT:  Informed consent was obtained directly from the patient. Risks, benefits and alternatives were fully discussed. Specific risks including but not limited to bleeding, infection, hematoma, seroma, scarring, pain, contracture, asymmetry, wound healing problems, and need for further surgery were all discussed. The patient did have an ample opportunity to have questions answered to satisfaction.   DESCRIPTION OF PROCEDURE:  The patient was taken to the operating room. SCDs were placed and anesthesia was administered.  The patient's operative site was prepped and draped in a sterile fashion. A time out was performed and all information was confirmed to be correct.    The right arm was irrigated with copious saline.  Curette debridement was performed gently along granulation tissue.  Additional debridement was performed with scissors.  Very superficial debridement was performed to remove small areas of desiccated tendon.  Bovie was used for hemostasis.  A Myriad sheet was hydrated in saline and applied and sutured in place with Vicryl sutures.  Following this 1 G of Morcells were applied under the Myriad sheet.  Mepetil was sutured in place over the Myriad.  A wound vac was then  applied to both wounds upper and lower over the Myriad and mepetil.  After confirming a good seal, a dry dressing of cast padding and ACE wrap was then applied.  The patient tolerated the procedure well.  There were no complications. The patient was allowed to wake from anesthesia, extubated and taken to the recovery room in satisfactory condition.

## 2021-12-14 NOTE — Anesthesia Preprocedure Evaluation (Signed)
Anesthesia Evaluation  Patient identified by MRN, date of birth, ID band Patient awake    Reviewed: Allergy & Precautions, NPO status , Patient's Chart, lab work & pertinent test results  Airway Mallampati: II  TM Distance: >3 FB Neck ROM: Full    Dental no notable dental hx.    Pulmonary Current Smoker and Patient abstained from smoking.,    Pulmonary exam normal breath sounds clear to auscultation       Cardiovascular negative cardio ROS Normal cardiovascular exam Rhythm:Regular Rate:Normal     Neuro/Psych negative neurological ROS  negative psych ROS   GI/Hepatic negative GI ROS, (+)     substance abuse  IV drug use,   Endo/Other  negative endocrine ROS  Renal/GU negative Renal ROS  negative genitourinary   Musculoskeletal negative musculoskeletal ROS (+)   Abdominal   Peds negative pediatric ROS (+)  Hematology  (+) anemia ,   Anesthesia Other Findings   Reproductive/Obstetrics negative OB ROS                             Anesthesia Physical Anesthesia Plan  ASA: 3  Anesthesia Plan: General   Post-op Pain Management: Toradol IV (intra-op)   Induction: Intravenous  PONV Risk Score and Plan: 2 and Ondansetron, Dexamethasone and Treatment may vary due to age or medical condition  Airway Management Planned: LMA  Additional Equipment:   Intra-op Plan:   Post-operative Plan: Extubation in OR  Informed Consent: I have reviewed the patients History and Physical, chart, labs and discussed the procedure including the risks, benefits and alternatives for the proposed anesthesia with the patient or authorized representative who has indicated his/her understanding and acceptance.     Dental advisory given  Plan Discussed with: CRNA and Surgeon  Anesthesia Plan Comments:         Anesthesia Quick Evaluation

## 2021-12-14 NOTE — Transfer of Care (Signed)
Immediate Anesthesia Transfer of Care Note  Patient: Jason Buchanan  Procedure(s) Performed: Procedure(s): IRRIGATION AND DEBRIDEMENT RIGHT ARM (Right) APPLICATION OF WOUND VAC (Right) APPLICATION OF MYRIAD, ACELL OR INTEGRA (Right)  Patient Location: PACU  Anesthesia Type:General  Level of Consciousness: Alert, Awake, Oriented  Airway & Oxygen Therapy: Patient Spontanous Breathing  Post-op Assessment: Report given to RN  Post vital signs: Reviewed and stable  Last Vitals:  Vitals:   12/13/21 2013 12/14/21 0427  BP: 135/70 (!) 146/76  Pulse: 100 100  Resp: 20 20  Temp: 36.7 C 36.5 C  SpO2: 57% 32%    Complications: No apparent anesthesia complications

## 2021-12-14 NOTE — Progress Notes (Signed)
PROGRESS NOTE    Jason Buchanan  [id number] DOB: [birth year] DOA: 11/14/2021 PCP: [name], NP     Brief Narrative:   36 year old male with medical history significant for polysubstance abuse including intravenous drug abuse presented to hospital with bilateral arm pain from injection to arms bilaterally.  CT scan of the upper extremity showed extensive cellulitis and was initially started on broad-spectrum antibiotic.  After hospitalization, patient has undergone multiple bilateral upper extremity I&D's by hand surgery and plastic surgery.  Blood culture was also positive for beta Streptococcus.  On 11/07/2021 patient developed melena with active GI bleed requiring PRBC transfusion.  GI was consulted and patient underwent EGD confirming mild proximal mild esophagitis with nonbleeding ulcers, nonbleeding duodenal ulcers which were injected.  Post procedure he developed respite failure requiring intubation and mechanical ventilation and was subsequently extubated on 11/17/2021.  Patient was subsequently considered stable for transfer out of the ICU and is currently in the hospital service.    Assessment & Plan:    Sepsis due to cellulitis with intractable pain.  Continue daptomycin until discharge.  Plan to transition to Zyvox on discharge.  Patient has been seen by plastic surgery, orthopedics and hand surgery.  Currently on Dilaudid,Tylenol oxycodone and gabapentin.  Denies requiring IV Dilaudid for adequate control of pain.  Last I&D was done on 12/08/2021 by Dr. Lenon Curt hand surgery.  Plan for surgical intervention again today.               Polysubstance abuse  History of IV drug abuse.  Patient wishes to undergo drug treatment program after the hospitalization.    Hypokalemia Latest potassium of 3.9.     Streptococcal bacteremia ID has seen the patient, patient had received antibiotic.  No plan for TEE.     Right arm cellulitis Status post I&D and surgery by plastic, orthopedic  and hand surgery.  Further surgical intervention as per surgery.    Staphylococcus aureus infection Currently on daptomycin, pharmacy to dose until discharge.  Patient will need to continue Zyvox 600 mg twice daily for 30 days on transition..  Will need to revisit with ID prior to discharge    Acute blood loss anemia due to upper GI bleed. Hold Toradol and NSAIDs.  Continue to monitor symptoms.   Latest hemoglobin of 10.5         Postoperative respiratory failure. Resolved at this time.     Amphetamine and psychostimulant-induced psychosis with hallucinations  Continue Seroquel and trazodone.     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   DVT prophylaxis: SCDs  Code Status: Full code  Family Communication:  Communicated with the patient at bedside  Disposition Plan: Home when medically stable and okay with surgery..  Barriers to Discharge: Medical improvement   Consultants:  Hand surgery Plastic surgery Orthopedic surgery PCCM Wound Care Psychiatry GI   Procedures:  12/08/21- I&D and debridement of LUE and L hand 12/07/21- Wound vac change of RUE, I&D of deep abscess of  R upper arm 12/03/21- I&D 12/01/21- I&D Incision and drainage and debridement right upper extremity/incision and drainage of left upper extremity by Dr. Lenon Curt hand surgeon 11/23/2021. Wound evaluation left upper extremity with dressing change under sedation/incision and drainage right upper extremity and loose closure of wound, right upper extremity per Dr. Lenon Curt, hand surgeon 11/18/2021 Right hand fasciotomy/right hand carpal tunnel release/right hand renal scan no release/right forearm 3 compartment  fasciotomy/thorough irrigation of hand and forearm/dorsal hand incision and drainage including extensor tendon sheath per Dr. Lenon Curt, hand surgeon 11/17/2021 Central line insertion per PCCM, Dr. Tamala Julian  11/17/2021 Upper endoscopy per GI, Dr. Watt Climes 11/17/2021 Left wrist deep abscess incision and drainage/left volar forearm fasciotomy/left wrist carpal tunnel release extended with Guyon's canal release--per Dr. Greta Doom orthopedics 11/15/2021  Antimicrobials:  Daptomycin   Subjective: Today, patient was seen and examined at bedside.  Complains of overall pain over the extremities.  Awaiting for surgical intervention today.   Objective: Vitals:   12/13/21 0353 12/13/21 1229 12/13/21 2013 12/14/21 0427  BP: 133/71 139/85 135/70 (!) 146/76  Pulse: 100 (!) 109 100 100  Resp: 20 16 20 20   Temp: 97.8 F (36.6 C) 97.9 F (36.6 C) 98 F (36.7 C) 97.7 F (36.5 C)  TempSrc: Oral Oral Oral Oral  SpO2: 98% 100% 98% 98%  Weight:      Height:        Intake/Output Summary (Last 24 hours) at 12/14/2021 1105 Last data filed at 12/13/2021 1702 Gross per 24 hour  Intake --  Output 1100 ml  Net -1100 ml    Filed Weights   11/14/21 0835 11/15/21 1608 11/25/21 1342  Weight: 60 kg 60 kg 60 kg    Physical examination:   General:  Average built, not in obvious distress, anxious HENT:   No scleral pallor or icterus noted. Oral mucosa is moist.  Chest:  Clear breath sounds.  Diminished breath sounds bilaterally. No crackles or wheezes.  CVS: S1 &S2 heard. No murmur.  Regular rate and rhythm. Abdomen: Soft, nontender, nondistended.  Bowel sounds are heard.   Extremities: No cyanosis, clubbing or edema.  Peripheral pulses are palpable.  Right upper extremity wound VAC in place.  Left wrist with dressing, right upper extremity wrapped in dressing Psych: Alert, awake and oriented, normal mood CNS:  No cranial nerve deficits.  Power equal in all extremities.   Skin: Warm and dry.  Left wrist with dressing, right upper extremity wrapped with dressing  Data Reviewed: I have personally reviewed following labs and imaging studies  CBC: Recent Labs  Lab 12/10/21 0909 12/11/21 0842 12/12/21 0554  12/13/21 0438 12/14/21 0832  WBC 12.6* 11.1* 10.2 11.3* 11.2*  HGB 9.5* 9.7* 9.6* 9.5* 10.4*  HCT 30.8* 32.4* 31.4* 30.9* 34.2*  MCV 87.5 88.3 88.2 88.3 88.4  PLT 747* 722* 652* 651* 616*    Basic Metabolic Panel: Recent Labs  Lab 12/08/21 0616 12/10/21 0909 12/11/21 0842 12/12/21 0554 12/13/21 0438 12/14/21 0832  NA 137 134* 131* 135 134* 136  K 4.1 3.8 3.5 3.8 3.8 3.9  CL 100 100 101 102 103 104  CO2 25 24 24 25 24 22   GLUCOSE 116* 100* 119* 113* 119* 97  BUN 16 16 18 17 14 15   CREATININE 0.45* 0.47* 0.48* 0.40* 0.41* 0.45*  CALCIUM 8.5* 8.6* 8.5* 8.7* 8.5* 8.5*  MG 1.9  --   --   --   --   --   PHOS 4.7*  --   --   --   --   --     GFR: Estimated Creatinine Clearance: 109.4 mL/min (A) (by C-G formula based on SCr of 0.45 mg/dL (L)).  Liver Function Tests: No results for input(s): AST, ALT, ALKPHOS, BILITOT, PROT, ALBUMIN in the last 168 hours.  Cardiac Enzymes: Recent Labs  Lab 12/09/21 0552  CKTOTAL 20*    Scheduled Meds:  (feeding supplement) PROSource Plus  30 mL  Oral TID BM   acetaminophen  500 mg Oral Q6H   vitamin C  500 mg Oral Daily   Chlorhexidine Gluconate Cloth  6 each Topical Daily   Chlorhexidine Gluconate Cloth  6 each Topical Once   feeding supplement  237 mL Oral BID BM   ferrous sulfate  325 mg Oral Q breakfast   gabapentin  300 mg Oral TID   nicotine  21 mg Transdermal Daily   pantoprazole  40 mg Oral BID   polyethylene glycol  17 g Oral BID   QUEtiapine  100 mg Oral BID   Continuous Infusions:  DAPTOmycin (CUBICIN)  IV 500 mg (12/13/21 2050)     LOS: 30 days    Flora Lipps, MD [facility] 12/14/2021, 11:05 AM

## 2021-12-14 NOTE — Progress Notes (Signed)
Patient off the floor and down to pre-op.

## 2021-12-15 ENCOUNTER — Encounter (HOSPITAL_COMMUNITY): Payer: Self-pay | Admitting: Plastic Surgery

## 2021-12-15 DIAGNOSIS — L03113 Cellulitis of right upper limb: Secondary | ICD-10-CM | POA: Diagnosis not present

## 2021-12-15 DIAGNOSIS — L0390 Cellulitis, unspecified: Secondary | ICD-10-CM | POA: Diagnosis not present

## 2021-12-15 DIAGNOSIS — D62 Acute posthemorrhagic anemia: Secondary | ICD-10-CM | POA: Diagnosis not present

## 2021-12-15 DIAGNOSIS — F15951 Other stimulant use, unspecified with stimulant-induced psychotic disorder with hallucinations: Secondary | ICD-10-CM | POA: Diagnosis not present

## 2021-12-15 LAB — CBC
HCT: 33.4 % — ABNORMAL LOW (ref 39.0–52.0)
Hemoglobin: 10.3 g/dL — ABNORMAL LOW (ref 13.0–17.0)
MCH: 27.1 pg (ref 26.0–34.0)
MCHC: 30.8 g/dL (ref 30.0–36.0)
MCV: 87.9 fL (ref 80.0–100.0)
Platelets: 585 10*3/uL — ABNORMAL HIGH (ref 150–400)
RBC: 3.8 MIL/uL — ABNORMAL LOW (ref 4.22–5.81)
RDW: 15.6 % — ABNORMAL HIGH (ref 11.5–15.5)
WBC: 24.9 10*3/uL — ABNORMAL HIGH (ref 4.0–10.5)
nRBC: 0 % (ref 0.0–0.2)

## 2021-12-15 LAB — RAPID URINE DRUG SCREEN, HOSP PERFORMED
Amphetamines: POSITIVE — AB
Barbiturates: NOT DETECTED
Benzodiazepines: POSITIVE — AB
Cocaine: NOT DETECTED
Opiates: POSITIVE — AB
Tetrahydrocannabinol: NOT DETECTED

## 2021-12-15 LAB — BASIC METABOLIC PANEL
Anion gap: 7 (ref 5–15)
BUN: 17 mg/dL (ref 6–20)
CO2: 23 mmol/L (ref 22–32)
Calcium: 8.4 mg/dL — ABNORMAL LOW (ref 8.9–10.3)
Chloride: 104 mmol/L (ref 98–111)
Creatinine, Ser: 0.44 mg/dL — ABNORMAL LOW (ref 0.61–1.24)
GFR, Estimated: >60 mL/min (ref >60)
Glucose, Bld: 157 mg/dL — ABNORMAL HIGH (ref 70–99)
Potassium: 3.7 mmol/L (ref 3.5–5.1)
Sodium: 134 mmol/L — ABNORMAL LOW (ref 135–145)

## 2021-12-15 MED ORDER — ENSURE ENLIVE PO LIQD
237.0000 mL | Freq: Three times a day (TID) | ORAL | Status: DC
  Administered 2021-12-15 – 2022-01-17 (×91): 237 mL via ORAL

## 2021-12-15 MED ORDER — GABAPENTIN 400 MG PO CAPS
400.0000 mg | ORAL_CAPSULE | Freq: Three times a day (TID) | ORAL | Status: DC
  Administered 2021-12-15 – 2022-01-17 (×97): 400 mg via ORAL
  Filled 2021-12-15 (×97): qty 1

## 2021-12-15 MED ORDER — NITROGLYCERIN 2 % TD OINT
0.5000 [in_us] | TOPICAL_OINTMENT | Freq: Four times a day (QID) | TRANSDERMAL | Status: AC
  Administered 2021-12-15: 0.5 [in_us] via TOPICAL
  Filled 2021-12-15: qty 30

## 2021-12-15 MED ORDER — LORAZEPAM 2 MG/ML IJ SOLN
1.0000 mg | Freq: Once | INTRAMUSCULAR | Status: AC
  Administered 2021-12-15: 1 mg via INTRAVENOUS
  Filled 2021-12-15: qty 1

## 2021-12-15 NOTE — Progress Notes (Signed)
Occupational Therapy Treatment Patient Details Name: Jason Buchanan MRN: [id number] DOB: December 11, 1985 Today's Date: 12/15/2021   History of present illness Pt is 36 yo male presented on 12/12 with bil UE edema/erythema after IV drug use.  Pt required R hand fasciotomy, carpal tunnel release, 3 compartment forearm fasciotomy, Guyon's canal release , and I and D hand/forearm on 12/15; and L wrist I and D, left volar forearm fasciotmoy, and L wrist carpal tunnel release on 12/13. On 12/14 concern for GIB and required 3 untis PRBC with ED 12/15.  Pt with hx of polysubstance abuse.   OT comments  Treatment focused on continue assessment of ROM and strength of upper extremities. Patient exhibited improving ability to feed himself. He reports the same with toileting. Patient has grossly functional range of motion of left hand though not full composite fist. Right hand has functional DIP and PIP ROM but  greatly impaired MCP ROM. Therapist instructed patient on how to perform passive ROM of joints to improve range of motion for a functional grasp. Patient reports pain as a limiting factor and patient encouraged to perform despite the pain. Patient verbalized understanding but will need further follow up and encouragement.   Recommendations for follow up therapy are one component of a multi-disciplinary discharge planning process, led by the attending physician.  Recommendations may be updated based on patient status, additional functional criteria and insurance authorization.    Follow Up Recommendations  Outpatient OT    Assistance Recommended at Discharge PRN  Patient can return home with the following  A little help with bathing/dressing/bathroom;Assistance with cooking/housework   Equipment Recommendations  None recommended by OT    Recommendations for Other Services      Precautions / Restrictions Precautions Precaution Comments: wound vac RUE Restrictions Weight Bearing Restrictions:  No Other Position/Activity Restrictions: no restrictions on BUE per Charma Igo PA on secure chat 11/21/21        ADL either performed or assessed with clinical judgement   ADL   Eating/Feeding: Set up Eating/Feeding Details (indicate cue type and reason): patietn demonstrates ability to feed self and grasp cup                         Toileting- Clothing Manipulation and Hygiene: Minimal assistance Toileting - Clothing Manipulation Details (indicate cue type and reason): reports improving ability to perform toileting. therapist recommended use of glove to protect bandages.            Extremity/Trunk Assessment Upper Extremity Assessment RUE Deficits / Details: wound vac still in place. grip still weak. grossly functional finger ROM though not a full composite fist LUE Deficits / Details: DIP and PIP jionts have functional ROM but MCPs lacking more than 1/2 range of motion. digt 2 and three with significant range of motion deficits and very stiff.             Cognition Arousal/Alertness: Awake/alert Behavior During Therapy: WFL for tasks assessed/performed Overall Cognitive Status: Within Functional Limits for tasks assessed                                            Exercises Other Exercises Other Exercises: educated and instructed patient how to perform passive ROM for compiste first and for each digit - most specially for right hand compared to left.   Shoulder Instructions  General Comments      Pertinent Vitals/ Pain       Pain Assessment: Faces Faces Pain Scale: Hurts even more Pain Location: fingers - with PROM Pain Descriptors / Indicators: Discomfort;Grimacing;Guarding Pain Intervention(s): Limited activity within patient's tolerance   Frequency  Min 1X/week        Progress Toward Goals  OT Goals(current goals can now be found in the care plan section)  Progress towards OT goals: Progressing toward  goals  Acute Rehab OT Goals Patient Stated Goal: improve grasp OT Goal Formulation: With patient Time For Goal Achievement: 12/29/21 Potential to Achieve Goals: Good  Plan Discharge plan remains appropriate    Co-evaluation                 AM-PAC OT "6 Clicks" Daily Activity     Outcome Measure   Help from another person eating meals?: None Help from another person taking care of personal grooming?: None Help from another person toileting, which includes using toliet, bedpan, or urinal?: A Little Help from another person bathing (including washing, rinsing, drying)?: A Little Help from another person to put on and taking off regular upper body clothing?: A Little Help from another person to put on and taking off regular lower body clothing?: A Little 6 Click Score: 20    End of Session    OT Visit Diagnosis: Muscle weakness (generalized) (M62.81);Pain   Activity Tolerance Patient tolerated treatment well   Patient Left in bed;with call bell/phone within reach;with bed alarm set   Nurse Communication          Time: 1440-1451 OT Time Calculation (min): 11 min  Charges: OT General Charges $OT Visit: 1 Visit OT Treatments $Therapeutic Exercise: 8-22 mins  Waldron Session, OTR/L Acute Care Rehab Services  Office [phone] Pager: [phone]   Kelli Churn 12/15/2021, 3:09 PM

## 2021-12-15 NOTE — Progress Notes (Addendum)
PROGRESS NOTE    Jason Buchanan  UUV:[id number] DOB: 02/28/1986 DOA: 11/14/2021 PCP: [name], NP     Brief Narrative:  36 year old male with medical history significant for polysubstance abuse including intravenous drug abuse presented to hospital with bilateral arm pain from injection to arms bilaterally.  CT scan of the upper extremity showed extensive cellulitis and was initially started on broad-spectrum antibiotic.  After hospitalization, patient has undergone multiple bilateral upper extremity I&D's by hand surgery and plastic surgery.  Blood culture was also positive for beta Streptococcus.  On 11/07/2021 patient developed melena with active GI bleed requiring PRBC transfusion.  GI was consulted and patient underwent EGD confirming mild proximal mild esophagitis with nonbleeding ulcers, nonbleeding duodenal ulcers which were injected.  Post procedure he developed respite failure requiring intubation and mechanical ventilation and was subsequently extubated on 11/17/2021.  Patient was subsequently considered stable for transfer out of the ICU and is currently in the hospital service.    Assessment & Plan:    Sepsis due to cellulitis with intractable pain.  Continue daptomycin until discharge.  Plan to transition to Zyvox on discharge.  Patient has been seen by plastic surgery, orthopedics and hand surgery.  Currently on Dilaudid,Tylenol oxycodone and gabapentin.  Patient continues to require IV Dilaudid for adequate control of pain.  Patient has had several I&D's during hospitalization and underwent debridement in preparation for skin graft with placement of Myriad to the right  arm open wound and wound VAC by plastic surgery on 12/14/2021, follow-up with plastic surgery as outpatient.            Polysubstance abuse  History of IV drug abuse.  Patient wishes to undergo drug treatment program after the hospitalization.    Hypokalemia Latest potassium of 3.7.     Streptococcal  bacteremia ID has seen the patient, patient had received antibiotic.  No plan for TEE.     Right arm cellulitis Status post I&D and surgery by plastic, orthopedic and hand surgery.  Further surgical intervention as per surgery.    Staphylococcus aureus infection Currently on daptomycin, pharmacy to dose until discharge.  Patient will need to continue Zyvox 600 mg twice daily for 30 days on transition.     Acute blood loss anemia due to upper GI bleed. Hold Toradol and NSAIDs.  Continue to monitor symptoms.   Latest hemoglobin of 10.3         Postoperative respiratory failure. Resolved at this time.     Amphetamine and psychostimulant-induced psychosis with hallucinations  Continue Seroquel and trazodone.     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   DVT prophylaxis: SCDs  Code Status: Full code  Family Communication:  Communicated with the patient at bedside  Disposition Plan: Home when medically stable and okay with surgery..  Barriers to Discharge: Medical improvement   Consultants:  Hand surgery Plastic surgery Orthopedic surgery PCCM Wound Care Psychiatry GI   Procedures:  12/08/21- I&D and debridement of LUE and L hand 12/07/21- Wound vac change of RUE, I&D of deep abscess of  R upper arm 12/03/21- I&D 12/01/21- I&D Incision and drainage and debridement right upper extremity/incision and drainage of left upper extremity by Dr. Lenon Curt hand surgeon 11/23/2021. Wound evaluation left upper extremity with dressing change under sedation/incision and drainage right upper extremity and loose closure of wound, right upper extremity per Dr. Lenon Curt, hand surgeon 11/18/2021 Right hand fasciotomy/right hand  carpal tunnel release/right hand renal scan no release/right forearm 3 compartment fasciotomy/thorough irrigation of hand and forearm/dorsal hand incision and drainage  including extensor tendon sheath per Dr. Lenon Curt, hand surgeon 11/17/2021 Central line insertion per PCCM, Dr. Tamala Julian 11/17/2021 Upper endoscopy per GI, Dr. Watt Climes 11/17/2021 Left wrist deep abscess incision and drainage/left volar forearm fasciotomy/left wrist carpal tunnel release extended with Guyon's canal release--per Dr. Greta Doom orthopedics 11/15/2021  Antimicrobials:  Daptomycin   Subjective: Today, patient was seen and examined at bedside.  Still still complains of pain and inquiring about other alternatives for adding on his pain medication regimen.  Objective: Vitals:   12/14/21 1915 12/14/21 1930 12/14/21 1955 12/15/21 0405  BP: (!) 143/82 135/60 (!) 143/67 126/62  Pulse: (!) 114 (!) 112 (!) 104 100  Resp: '19 19 20 20  ' Temp:  98.1 F (36.7 C) 98 F (36.7 C) 97.9 F (36.6 C)  TempSrc:   Oral Oral  SpO2: 97% 98% 98% 98%  Weight:      Height:        Intake/Output Summary (Last 24 hours) at 12/15/2021 1238 Last data filed at 12/15/2021 0945 Gross per 24 hour  Intake 3335.5 ml  Output 3820 ml  Net -484.5 ml    Filed Weights   11/14/21 0835 11/15/21 1608 11/25/21 1342  Weight: 60 kg 60 kg 60 kg    Physical examination:  General:  Average built, not in obvious distress HENT:   No scleral pallor or icterus noted. Oral mucosa is moist.  Chest:  Clear breath sounds.  Diminished breath sounds bilaterally. No crackles or wheezes.  CVS: S1 &S2 heard. No murmur.  Regular rate and rhythm. Abdomen: Soft, nontender, nondistended.  Bowel sounds are heard.   Extremities: Right upper extremity wound and dressing, left wrist with dressing. Psych: Alert, awake and oriented, normal mood CNS:  No cranial nerve deficits.  Power equal in all extremities.   Skin: Warm and dry.   Data Reviewed: I have personally reviewed following labs and imaging studies  CBC: Recent Labs  Lab 12/11/21 0842 12/12/21 0554 12/13/21 0438 12/14/21 0832 12/15/21 0730  WBC 11.1* 10.2 11.3* 11.2* 24.9*   HGB 9.7* 9.6* 9.5* 10.4* 10.3*  HCT 32.4* 31.4* 30.9* 34.2* 33.4*  MCV 88.3 88.2 88.3 88.4 87.9  PLT 722* 652* 651* 616* 585*    Basic Metabolic Panel: Recent Labs  Lab 12/11/21 0842 12/12/21 0554 12/13/21 0438 12/14/21 0832 12/15/21 0730  NA 131* 135 134* 136 134*  K 3.5 3.8 3.8 3.9 3.7  CL 101 102 103 104 104  CO2 '24 25 24 22 23  ' GLUCOSE 119* 113* 119* 97 157*  BUN '18 17 14 15 17  ' CREATININE 0.48* 0.40* 0.41* 0.45* 0.44*  CALCIUM 8.5* 8.7* 8.5* 8.5* 8.4*    GFR: Estimated Creatinine Clearance: 109.4 mL/min (A) (by C-G formula based on SCr of 0.44 mg/dL (L)).  Liver Function Tests: No results for input(s): AST, ALT, ALKPHOS, BILITOT, PROT, ALBUMIN in the last 168 hours.  Cardiac Enzymes: Recent Labs  Lab 12/09/21 0552  CKTOTAL 20*    Scheduled Meds:  (feeding supplement) PROSource Plus  30 mL Oral TID BM   acetaminophen  500 mg Oral Q6H   vitamin C  500 mg Oral Daily   Chlorhexidine Gluconate Cloth  6 each Topical Daily   feeding supplement  237 mL Oral TID BM   ferrous sulfate  325 mg Oral Q breakfast   gabapentin  300 mg Oral TID   nicotine  21 mg Transdermal Daily   pantoprazole  40 mg Oral BID   polyethylene glycol  17 g Oral BID   QUEtiapine  100 mg Oral BID   Continuous Infusions:  DAPTOmycin (CUBICIN)  IV Stopped (12/14/21 2127)     LOS: 31 days    Flora Lipps, MD [facility] 12/15/2021, 12:38 PM

## 2021-12-15 NOTE — Progress Notes (Signed)
°   12/15/21 2307  Assess: MEWS Score  Temp 98 F (36.7 C)  BP (!) 175/86  Pulse Rate (!) 138  ECG Heart Rate (!) 137  Resp 18  Level of Consciousness Alert  SpO2 98 %  O2 Device Room Air  Patient Activity (if Appropriate) In bed  Assess: MEWS Score  MEWS Temp 0  MEWS Systolic 0  MEWS Pulse 3  MEWS RR 0  MEWS LOC 0  MEWS Score 3  MEWS Score Color Yellow  Assess: if the MEWS score is Yellow or Red  Were vital signs taken at a resting state? Yes  Focused Assessment Change from prior assessment (see assessment flowsheet)  Does the patient meet 2 or more of the SIRS criteria? No  Does the patient have a confirmed or suspected source of infection? Yes  Provider and Rapid Response Notified? No  MEWS guidelines implemented *See Row Information* Yes  Treat  MEWS Interventions Administered scheduled meds/treatments;Administered prn meds/treatments  Pain Scale 0-10  Pain Score 8  Pain Location Arm  Pain Orientation Right  Pain Descriptors / Indicators Sharp  Pain Frequency Constant  Pain Onset On-going  Assess: SIRS CRITERIA  SIRS Temperature  0  SIRS Pulse 1  SIRS Respirations  0  SIRS WBC 0  SIRS Score Sum  1   Pt went from Red to Yellow MEWS. Discussed careplan w/ patient. Explained to patient his UA drug screen tested positive for benzos, amphetamines, and opiates and that he is not able to have any more visitors for his safety. Pt believes visitor who saw him today may wish him harm, states that the patient "owes him $250". Pt states the visitor is his sister's boyfriend but cannot recall his exact name. Pt requests that said visitor is blocked from contact/visitation. Will continue to monitor

## 2021-12-15 NOTE — TOC Progression Note (Signed)
Transition of Care [facility]) - Progression Note    Patient Details  Name: Jason Buchanan MRN: [id number] Date of Birth: November 22, 1986  Transition of Care [facility]) CM/SW Contact  Zaley Talley, Olegario Messier, RN Phone Number: 12/15/2021, 10:24 AM  Clinical Narrative: Spoke to patient about d/c plans-homeless, but agreeable to shelters(he is willing to go  to shelters in [place]-he will call on his own while in hospital to get on wait list; Informed him there are no beds for SNF to accept him currently. He has a wound vac in place. Continue to monitor.     Expected Discharge Plan:  (currently unknown) Barriers to Discharge: Continued Medical Work up  Expected Discharge Plan and Services Expected Discharge Plan:  (currently unknown)   Discharge Planning Services: CM Consult   Living arrangements for the past 2 months: Homeless Shelter Expected Discharge Date: 11/14/21                                     Social Determinants of Health (SDOH) Interventions    Readmission Risk Interventions No flowsheet data found.

## 2021-12-15 NOTE — Progress Notes (Signed)
°   12/15/21 1459  Assess: MEWS Score  Temp 97.8 F (36.6 C)  BP (!) 148/67  Pulse Rate (!) 162  Resp 20  SpO2 100 %  Assess: MEWS Score  MEWS Temp 0  MEWS Systolic 0  MEWS Pulse 3  MEWS RR 0  MEWS LOC 0  MEWS Score 3  MEWS Score Color Yellow  Assess: if the MEWS score is Yellow or Red  Were vital signs taken at a resting state? Yes  Focused Assessment Change from prior assessment (see assessment flowsheet)  Does the patient meet 2 or more of the SIRS criteria? No  Does the patient have a confirmed or suspected source of infection? Yes  Provider and Rapid Response Notified? No  MEWS guidelines implemented *See Row Information* Yes  Treat  MEWS Interventions Other (Comment) (MD notified no new orders)  Take Vital Signs  Increase Vital Sign Frequency  Yellow: Q 2hr X 2 then Q 4hr X 2, if remains yellow, continue Q 4hrs  Escalate  MEWS: Escalate Yellow: discuss with charge nurse/RN and consider discussing with provider and RRT  Notify: Charge Nurse/RN  Name of Charge Nurse/RN Notified Advertising copywriter  Date Charge Nurse/RN Notified 12/15/21  Time Charge Nurse/RN Notified 1500  Notify: Provider  Provider Name/Title Pokrhel  Date Provider Notified 12/15/21  Time Provider Notified 1500  Notification Type Page  Notification Reason Change in status  Provider response No new orders  Date of Provider Response 12/15/21  Time of Provider Response 1505  Document  Patient Outcome Not stable and remains on department  Progress note created (see row info) Yes  Assess: SIRS CRITERIA  SIRS Temperature  0  SIRS Pulse 1  SIRS Respirations  0  SIRS WBC 0  SIRS Score Sum  1     Patient noted to be in a yellow mews due to HR 140's. Patient noted to be diaphoretic and extremely anxious. Md notified. No new orders. Charge nurse notified.

## 2021-12-16 DIAGNOSIS — F15951 Other stimulant use, unspecified with stimulant-induced psychotic disorder with hallucinations: Secondary | ICD-10-CM | POA: Diagnosis not present

## 2021-12-16 DIAGNOSIS — L0390 Cellulitis, unspecified: Secondary | ICD-10-CM | POA: Diagnosis not present

## 2021-12-16 DIAGNOSIS — L03113 Cellulitis of right upper limb: Secondary | ICD-10-CM | POA: Diagnosis not present

## 2021-12-16 DIAGNOSIS — D62 Acute posthemorrhagic anemia: Secondary | ICD-10-CM | POA: Diagnosis not present

## 2021-12-16 LAB — CBC
HCT: 32.2 % — ABNORMAL LOW (ref 39.0–52.0)
Hemoglobin: 9.8 g/dL — ABNORMAL LOW (ref 13.0–17.0)
MCH: 26.9 pg (ref 26.0–34.0)
MCHC: 30.4 g/dL (ref 30.0–36.0)
MCV: 88.5 fL (ref 80.0–100.0)
Platelets: 543 10*3/uL — ABNORMAL HIGH (ref 150–400)
RBC: 3.64 MIL/uL — ABNORMAL LOW (ref 4.22–5.81)
RDW: 15.6 % — ABNORMAL HIGH (ref 11.5–15.5)
WBC: 18.3 10*3/uL — ABNORMAL HIGH (ref 4.0–10.5)
nRBC: 0 % (ref 0.0–0.2)

## 2021-12-16 LAB — BASIC METABOLIC PANEL
Anion gap: 9 (ref 5–15)
BUN: 17 mg/dL (ref 6–20)
CO2: 24 mmol/L (ref 22–32)
Calcium: 8.5 mg/dL — ABNORMAL LOW (ref 8.9–10.3)
Chloride: 104 mmol/L (ref 98–111)
Creatinine, Ser: 0.34 mg/dL — ABNORMAL LOW (ref 0.61–1.24)
GFR, Estimated: >60 mL/min (ref >60)
Glucose, Bld: 102 mg/dL — ABNORMAL HIGH (ref 70–99)
Potassium: 3.6 mmol/L (ref 3.5–5.1)
Sodium: 137 mmol/L (ref 135–145)

## 2021-12-16 LAB — CK: Total CK: 30 U/L — ABNORMAL LOW (ref 49–397)

## 2021-12-16 NOTE — Progress Notes (Signed)
°   12/16/21 1004  Assess: MEWS Score  Temp 98.5 F (36.9 C)  BP (!) 162/79  Pulse Rate (!) 132  ECG Heart Rate (!) 127  Resp 19  Level of Consciousness Alert  SpO2 100 %  O2 Device Room Air  Patient Activity (if Appropriate) In bed  Assess: MEWS Score  MEWS Temp 0  MEWS Systolic 0  MEWS Pulse 2  MEWS RR 0  MEWS LOC 0  MEWS Score 2  MEWS Score Color Yellow  Assess: if the MEWS score is Yellow or Red  Were vital signs taken at a resting state? Yes  Focused Assessment No change from prior assessment  Does the patient meet 2 or more of the SIRS criteria? No  Does the patient have a confirmed or suspected source of infection? Yes  Provider and Rapid Response Notified? No  MEWS guidelines implemented *See Row Information* No, previously yellow, continue vital signs every 4 hours  Treat  MEWS Interventions Administered prn meds/treatments  Pain Scale 0-10  Pain Score 10  Faces Pain Scale 10  Pain Type Surgical pain  Pain Location Arm  Pain Orientation Right;Left  Pain Descriptors / Indicators Aching  Pain Frequency Constant  Pain Onset On-going  Patients Stated Pain Goal 2  Pain Intervention(s) Distraction  Take Vital Signs  Increase Vital Sign Frequency  Yellow: Q 2hr X 2 then Q 4hr X 2, if remains yellow, continue Q 4hrs  Escalate  MEWS: Escalate Yellow: discuss with charge nurse/RN and consider discussing with provider and RRT  Notify: Charge Nurse/RN  Name of Charge Nurse/RN Notified Kasia RN  Date Charge Nurse/RN Notified 12/16/21  Time Charge Nurse/RN Notified 1004  Notify: Provider  Provider Name/Title Pokhrel  Date Provider Notified 12/16/21  Time Provider Notified 1004  Notification Type Rounds  Notification Reason Other (Comment) (update)  Provider response No new orders  Date of Provider Response 12/16/21  Time of Provider Response 1010  Document  Patient Outcome Other (Comment) (stable remain on unit)  Progress note created (see row info) Yes   Assess: SIRS CRITERIA  SIRS Temperature  0  SIRS Pulse 1  SIRS Respirations  0  SIRS WBC 1  SIRS Score Sum  2

## 2021-12-16 NOTE — Progress Notes (Signed)
Pt requested to make his information private and to have no visitors. Break the glass protocol implemented.

## 2021-12-16 NOTE — Progress Notes (Signed)
°PROGRESS NOTE ° ° ° °Jason Buchanan  MRN:[id number] DOB: 11/07/1986 DOA: 11/14/2021 °PCP: [name], Mary Ann, NP  ° °  °Brief Narrative:  ° °36-year-old male with medical history significant for polysubstance abuse including intravenous drug abuse presented to hospital with bilateral arm pain from injection to arms bilaterally.  CT scan of the upper extremity showed extensive cellulitis and was initially started on broad-spectrum antibiotic.  After hospitalization, patient has undergone multiple bilateral upper extremity I&D's by hand surgery and plastic surgery.  Blood culture was also positive for beta Streptococcus.  On 11/07/2021 patient developed melena with active GI bleed requiring PRBC transfusion.  GI was consulted and patient underwent EGD confirming mild proximal mild esophagitis with nonbleeding ulcers, nonbleeding duodenal ulcers which were injected.  Post procedure he developed respite failure requiring intubation and mechanical ventilation and was subsequently extubated on 11/17/2021.  Patient was subsequently considered stable for transfer out of the ICU and is currently in the hospital service.   ° °Assessment & Plan: °  ° Sepsis due to cellulitis with intractable pain.  Continue daptomycin until discharge.  Plan to transition to Zyvox on discharge.  Patient has been seen by plastic surgery, orthopedics and hand surgery.  Currently on Dilaudid,Tylenol oxycodone and gabapentin.  Patient continues to require IV Dilaudid for adequate control of pain.  Patient has had several I&D's during hospitalization and underwent debridement in preparation for skin graft with placement of Myriad to the right  arm open wound and wound VAC by plastic surgery on 12/14/2021, follow-up with plastic surgery.  Dose of gabapentin was increased for pain management. ° °Tachycardia restlessness yesterday.  Urine drug screen was positive for amphetamines.  Patient admitted to this.  Was counseled about it. °         °   Polysubstance abuse  °History of IV drug abuse.  Patient wishes to undergo drug treatment program after the hospitalization. ° °  Hypokalemia °Latest potassium of 3.6. °  °  Streptococcal bacteremia °ID has seen the patient, patient had received antibiotic.  No plan for TEE. °  °  Right arm cellulitis °Status post I&D and surgery by plastic, orthopedic and hand surgery.  Further surgical intervention as per surgery. ° °  Staphylococcus aureus infection °Currently on daptomycin, pharmacy to dose until discharge.  Patient will need to continue Zyvox 600 mg twice daily for 30 days on transition.  Monitor CBC closely. ° °  Acute blood loss anemia due to upper GI bleed. °Hold Toradol and NSAIDs.  Continue to monitor symptoms.   Latest hemoglobin of 9.8.  °Postoperative respiratory failure. °Resolved at this time. °  °  Amphetamine and psychostimulant-induced psychosis with hallucinations  °Continue Seroquel and trazodone. °  °  Hepatitis C 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 °Follow-up with hepatology as outpatient °  °  Protein-calorie malnutrition, severe °Continue nutritional support. °  °DVT prophylaxis: SCDs ° °Code Status: Full code ° °Family Communication:  °Communicated with the patient at bedside ° °Disposition Plan: Home when medically stable and okay with surgery.. ° °Barriers to Discharge: Medical improvement °  °Consultants:  °Hand surgery °Plastic surgery °Orthopedic surgery °PCCM °Wound Care °Psychiatry °GI °  °Procedures:  °12/08/21- I&D and debridement of LUE and L hand °12/07/21- Wound vac change of RUE, I&D of deep abscess of  R upper arm °12/03/21- I&D °12/01/21- I&D °Incision and drainage and debridement right upper extremity/incision and drainage of left upper extremity by Dr. Coley hand surgeon 11/23/2021. °Wound evaluation left upper extremity with dressing   dressing change under sedation/incision and drainage right upper extremity and loose closure of wound, right upper extremity per Dr. Lenon Curt, hand surgeon  11/18/2021 Right hand fasciotomy/right hand carpal tunnel release/right hand renal scan no release/right forearm 3 compartment fasciotomy/thorough irrigation of hand and forearm/dorsal hand incision and drainage including extensor tendon sheath per Dr. Lenon Curt, hand surgeon 11/17/2021 Central line insertion per PCCM, Dr. Tamala Julian 11/17/2021 Upper endoscopy per GI, Dr. Watt Climes 11/17/2021 Left wrist deep abscess incision and drainage/left volar forearm fasciotomy/left wrist carpal tunnel release extended with Guyon's canal release--per Dr. Greta Doom orthopedics 11/15/2021  Antimicrobials:  Daptomycin   Subjective: Today, patient was seen and examined at bedside.  Complains of pain.  He was noted to be very tachycardic yesterday and tested positive for amphetamines.    Objective: Vitals:   12/16/21 0105 12/16/21 0209 12/16/21 0610 12/16/21 1004  BP: (!) 167/69 (!) 156/70 (!) 158/93 (!) 162/79  Pulse: (!) 129 (!) 131 (!) 126 (!) 132  Resp: _0 Temp: 97.9 F (36.6 C) 97.7 F (36.5 C) 98.3 F (36.8 C) 98.5 F (36.9 C)  TempSrc:  Oral    SpO2: 98% 100%    Weight:      Height:        Intake/Output Summary (Last 24 hours) at 12/16/2021 1259 Last data filed at 12/16/2021 0900 Gross per 24 hour  Intake 720 ml  Output 1675 ml  Net -955 ml    Filed Weights   11/14/21 0835 11/15/21 1608 11/25/21 1342  Weight: 60 kg 60 kg 60 kg    Physical examination:  General:  Average built, not in obvious distress, appears mildly anxious with pressurized speech. HENT:   No scleral pallor or icterus noted. Oral mucosa is moist.  Chest:  Clear breath sounds.  Diminished breath sounds bilaterally. No crackles or wheezes.  CVS: S1 &S2 heard. No murmur.  Regular rate and rhythm.  Tachycardic. Abdomen: Soft, nontender, nondistended.  Bowel sounds are heard.   Extremities: Right upper extremity with Ace bandage, left wrist with Ace bandage. Psych: Alert, awake and oriented, anxious mood. CNS:  No cranial  nerve deficits.  Power equal in all extremities.   Skin: Warm and dry.  No rashes noted.  Data Reviewed: I have personally reviewed following labs and imaging studies  CBC: Recent Labs  Lab 12/12/21 0554 12/13/21 0438 12/14/21 0832 12/15/21 0730 12/16/21 0451  WBC 10.2 11.3* 11.2* 24.9* 18.3*  HGB 9.6* 9.5* 10.4* 10.3* 9.8*  HCT 31.4* 30.9* 34.2* 33.4* 32.2*  MCV 88.2 88.3 88.4 87.9 88.5  PLT 652* 651* 616* 585* 543*    Basic Metabolic Panel: Recent Labs  Lab 12/12/21 0554 12/13/21 0438 12/14/21 0832 12/15/21 0730 12/16/21 0451  NA 135 134* 136 134* 137  K 3.8 3.8 3.9 3.7 3.6  CL 102 103 104 104 104  CO2 _1 GLUCOSE 113* 119* 97 157* 102*  BUN _2 CREATININE 0.40* 0.41* 0.45* 0.44* 0.34*  CALCIUM 8.7* 8.5* 8.5* 8.4* 8.5*    GFR: Estimated Creatinine Clearance: 109.4 mL/min (A) (by C-G formula based on SCr of 0.34 mg/dL (L)).  Liver Function Tests: No results for input(s): AST, ALT, ALKPHOS, BILITOT, PROT, ALBUMIN in the last 168 hours.  Cardiac Enzymes: Recent Labs  Lab 12/16/21 0451  CKTOTAL 30*    Scheduled Meds:  (feeding supplement) PROSource Plus  30 mL Oral TID BM   acetaminophen  500 mg Oral Q6H   vitamin C  500 mg Oral Daily  ° Chlorhexidine Gluconate Cloth  6 each Topical Daily  ° feeding supplement  237 mL Oral TID BM  ° ferrous sulfate  325 mg Oral Q breakfast  ° gabapentin  400 mg Oral TID  ° nicotine  21 mg Transdermal Daily  ° pantoprazole  40 mg Oral BID  ° polyethylene glycol  17 g Oral BID  ° QUEtiapine  100 mg Oral BID  ° °Continuous Infusions: ° DAPTOmycin (CUBICIN)  IV 500 mg (12/15/21 1953)  ° ° ° LOS: 32 days  ° ° ° , MD °[facility] °12/16/2021, 12:59 PM  ° ° °

## 2021-12-16 NOTE — Progress Notes (Signed)
°   12/16/21 1248  Mobility  Activity Ambulated in hall  Level of Assistance Contact guard assist, steadying assist  Assistive Device Other (Comment) (IV pole)  Distance Ambulated (ft) 45 ft  Mobility Ambulated with assistance in hallway  Mobility Response Tolerated fair  Mobility performed by Mobility specialist  $Mobility charge 1 Mobility   Pt agreeable to mobilize this morning. Ambulated about 69ft in hall with IV pole, tolerated fair. Pt stated he was feeling dizzy, and began to pale slightly. Got pt back to room. He requested to sit in the chair. Left pt in chair with call bell at side, and ice pack under BUE.   Jason Buchanan Mobility Specialist Acute Rehab Services Office: [phone]

## 2021-12-17 DIAGNOSIS — L0390 Cellulitis, unspecified: Secondary | ICD-10-CM | POA: Diagnosis not present

## 2021-12-17 DIAGNOSIS — D62 Acute posthemorrhagic anemia: Secondary | ICD-10-CM | POA: Diagnosis not present

## 2021-12-17 DIAGNOSIS — F15951 Other stimulant use, unspecified with stimulant-induced psychotic disorder with hallucinations: Secondary | ICD-10-CM | POA: Diagnosis not present

## 2021-12-17 DIAGNOSIS — L03113 Cellulitis of right upper limb: Secondary | ICD-10-CM | POA: Diagnosis not present

## 2021-12-17 LAB — CBC
HCT: 31.7 % — ABNORMAL LOW (ref 39.0–52.0)
Hemoglobin: 9.7 g/dL — ABNORMAL LOW (ref 13.0–17.0)
MCH: 26.6 pg (ref 26.0–34.0)
MCHC: 30.6 g/dL (ref 30.0–36.0)
MCV: 87.1 fL (ref 80.0–100.0)
Platelets: 462 10*3/uL — ABNORMAL HIGH (ref 150–400)
RBC: 3.64 MIL/uL — ABNORMAL LOW (ref 4.22–5.81)
RDW: 15.5 % (ref 11.5–15.5)
WBC: 14 10*3/uL — ABNORMAL HIGH (ref 4.0–10.5)
nRBC: 0 % (ref 0.0–0.2)

## 2021-12-17 MED ORDER — OXYCODONE HCL 5 MG PO TABS: 15.0000 mg | ORAL_TABLET | Freq: Four times a day (QID) | ORAL | Status: DC | PRN

## 2021-12-17 MED ORDER — PROPRANOLOL HCL 1 MG/ML IV SOLN: 1.0000 mg | Freq: Once | INTRAVENOUS | Status: DC

## 2021-12-17 MED ORDER — LABETALOL HCL 5 MG/ML IV SOLN
10.0000 mg | Freq: Once | INTRAVENOUS | Status: AC
  Administered 2021-12-17: 10 mg via INTRAVENOUS
  Filled 2021-12-17: qty 4

## 2021-12-17 MED ORDER — OXYCODONE HCL 5 MG PO TABS
15.0000 mg | ORAL_TABLET | ORAL | Status: DC | PRN
  Administered 2021-12-17 – 2021-12-25 (×31): 15 mg via ORAL
  Filled 2021-12-17 (×34): qty 3

## 2021-12-17 NOTE — Progress Notes (Signed)
During shift RN offered to do pt dsg change to hand per order but pt refused stating it was done right before change of shift and his dsg is intact. Pt stated to have dayshift RN do the dsg change when he wake up in the am. Oncoming RN informed. Dionne Bucy RN

## 2021-12-17 NOTE — Progress Notes (Signed)
Occupational Therapy Treatment Patient Details Name: Jason Buchanan MRN: [id number] DOB: 06/27/1986 Today's Date: 12/17/2021   History of present illness Pt is 36 yo male presented on 12/12 with bil UE edema/erythema after IV drug use.  Pt required R hand fasciotomy, carpal tunnel release, 3 compartment forearm fasciotomy, Guyon's canal release , and I and D hand/forearm on 12/15; and L wrist I and D, left volar forearm fasciotmoy, and L wrist carpal tunnel release on 12/13. On 12/14 concern for GIB and required 3 untis PRBC with ED 12/15.  Pt with hx of polysubstance abuse.   OT comments  Treatment focused stretching to each hand to improve MCP/PIP ROM as well as reiteration of how and why patient should perform himself. Some manipulation at the carpals and tarsals to improve ROM and reduce tightness in the area. Patient verbalizes understanding. Swears he has been stretching his fingers though ROM in left hand appears the name. After stretching had approx 10 degrees more in MCPs. Will continue to follow and promote more aggressive HEP.   Recommendations for follow up therapy are one component of a multi-disciplinary discharge planning process, led by the attending physician.  Recommendations may be updated based on patient status, additional functional criteria and insurance authorization.    Follow Up Recommendations  Outpatient OT    Assistance Recommended at Discharge PRN  Patient can return home with the following  A little help with bathing/dressing/bathroom;Assistance with cooking/housework   Equipment Recommendations  None recommended by OT    Recommendations for Other Services      Precautions / Restrictions Precautions Precautions: Fall Precaution Comments: wound vac RUE Restrictions Other Position/Activity Restrictions: no restrictions on BUE per Silvestre Gunner PA on secure chat 11/21/21       Mobility Bed Mobility                    Transfers                          Balance                                           ADL either performed or assessed with clinical judgement   ADL                                              Extremity/Trunk Assessment              Vision       Perception     Praxis      Cognition Arousal/Alertness: Awake/alert Behavior During Therapy: WFL for tasks assessed/performed Overall Cognitive Status: Within Functional Limits for tasks assessed                                            Exercises Other Exercises Other Exercises: RUE: PROM to MCPs, PIPs. Finger tips can almost touch palm. Other Exercises: LUE: PROM to wrist - approx 15 degrees wrist extension. PROM to PIPs and MCPs (more focus on MCPs). Gentle stretching and manipulation around the carpals and the metacarpals.   Shoulder Instructions  General Comments      Pertinent Vitals/ Pain       Pain Assessment: Faces Faces Pain Scale: Hurts whole lot Pain Location: fingers - with PROM Pain Descriptors / Indicators: Discomfort;Grimacing;Guarding;Restless;Moaning Pain Intervention(s): Monitored during session  Home Living                                          Prior Functioning/Environment              Frequency  Min 1X/week        Progress Toward Goals  OT Goals(current goals can now be found in the care plan section)  Progress towards OT goals: Progressing toward goals  Acute Rehab OT Goals Patient Stated Goal: improve grasp OT Goal Formulation: With patient Time For Goal Achievement: 12/29/21 Potential to Achieve Goals: Good  Plan Discharge plan remains appropriate    Co-evaluation                 AM-PAC OT "6 Clicks" Daily Activity     Outcome Measure   Help from another person eating meals?: None Help from another person taking care of personal grooming?: None Help from another person toileting, which  includes using toliet, bedpan, or urinal?: A Little Help from another person bathing (including washing, rinsing, drying)?: A Little Help from another person to put on and taking off regular upper body clothing?: A Little Help from another person to put on and taking off regular lower body clothing?: A Little 6 Click Score: 20    End of Session    OT Visit Diagnosis: Muscle weakness (generalized) (M62.81);Pain   Activity Tolerance Patient limited by pain   Patient Left in bed;with call bell/phone within reach   Nurse Communication Patient requests pain meds        Time: [id number] OT Time Calculation (min): 11 min  Charges: OT General Charges $OT Visit: 1 Visit OT Treatments $Therapeutic Exercise: 8-22 mins  Derl Barrow, OTR/L [place]  Office [phone] Pager: [place] 12/17/2021, 3:33 PM

## 2021-12-17 NOTE — Progress Notes (Signed)
At the beginning of shift, pt HR noted to be go up the 140's sustaining especially when ambulating to the BR and whiles sitting up at the side of bed to eat. NP on-call was notified and no new order received. VS rechecked HR down to the 110's later but BP elevated. NP on-call was notified of elevated BP with no new order received. Pt HR noted to be sustaining in the 150's with BP remaining elevated. Pt given his prn medication and NP on-call notified, new orders received. Pt continue to have some shakiness and report some nausea. Prn antiemetic given. Will continue to closely monitor pt. Delia Heady RN   12/17/21 0228  Vitals  Temp 98.2 F (36.8 C)  Temp Source Oral  BP (!) 173/82  MAP (mmHg) 104  BP Location Left Leg  BP Method Automatic  Patient Position (if appropriate) Lying  Pulse Rate (!) 153  Pulse Rate Source Monitor  Resp 20  MEWS COLOR  MEWS Score Color Yellow  Oxygen Therapy  SpO2 97 %  O2 Device Room Air  MEWS Score  MEWS Temp 0  MEWS Systolic 0  MEWS Pulse 3  MEWS RR 0  MEWS LOC 0  MEWS Score 3  Provider Notification  Provider Name/Title Jeannette Corpus  Date Provider Notified 12/17/21  Time Provider Notified 0228  Notification Type Page  Notification Reason Other (Comment) (notified of VS)  Provider response See new orders  Date of Provider Response 12/17/21  Time of Provider Response [phone]

## 2021-12-17 NOTE — Progress Notes (Signed)
PROGRESS NOTE    Jason Buchanan  LOV:[id number] DOB: [birth year] DOA: 11/14/2021 PCP: [name], NP     Brief Narrative:   36 year old male with medical history significant for polysubstance abuse including intravenous drug abuse presented to hospital with bilateral arm pain from injection to arms bilaterally.  CT scan of the upper extremity showed extensive cellulitis and was initially started on broad-spectrum antibiotic.  After hospitalization, patient has undergone multiple bilateral upper extremity I&D's by hand surgery and plastic surgery.  Blood culture was also positive for beta Streptococcus.  On 11/07/2021, patient developed melena with active GI bleed requiring PRBC transfusion.  GI was consulted and patient underwent EGD confirming mild proximal mild esophagitis with nonbleeding ulcers, nonbleeding duodenal ulcers which were injected.  Post procedure, he developed respite failure requiring intubation and mechanical ventilation and was subsequently extubated on 11/17/2021.  Patient was subsequently considered stable for transfer out of the ICU and is currently in the hospital service.    Assessment & Plan:   Sepsis due to cellulitis with intractable pain.  Continue daptomycin until discharge.  Plan to transition to Zyvox on discharge.  Patient has been seen by plastic surgery, orthopedics and hand surgery.  Currently on Dilaudid,Tylenol oxycodone and gabapentin.  Patient continues to require IV Dilaudid for adequate control of pain.  Patient has had several I&D's during hospitalization and underwent debridement in preparation for skin graft with placement of Myriad to the right  arm open wound and wound VAC by plastic surgery on 12/14/2021, follow-up with plastic surgery.  Dose of gabapentin was increased for pain management.  Tachycardia restlessness Urine drug screen was positive for amphetamines.  Patient admitted to this.  Was counseled about it.  Still with mild tachycardia.             Polysubstance abuse  History of IV drug abuse.  Patient wishes to undergo drug treatment program after the hospitalization.    Hypokalemia Latest potassium of 3.6.     Streptococcal bacteremia ID has seen the patient, patient had received antibiotic.  No plan for TEE.     Right arm cellulitis Status post I&D and surgery by plastic, orthopedic and hand surgery.  Further surgical intervention as per surgery.    Staphylococcus aureus infection Currently on daptomycin, pharmacy to dose until discharge.  Patient will need to continue Zyvox 600 mg twice daily f on transition.  We will check with ID regarding this prior to discharge.  Monitor CBC closely.    Acute blood loss anemia due to upper GI bleed. Hold Toradol and NSAIDs.  Continue to monitor symptoms.   Latest hemoglobin of 9.8.   Postoperative respiratory failure. Resolved at this time.     Amphetamine and psychostimulant-induced psychosis with hallucinations  Continue Seroquel and trazodone.  Patient has had more episodes in the hospital and was counseled about it.     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   DVT prophylaxis: SCDs  Code Status: Full code  Family Communication:  Communicated with the patient at bedside  Disposition Plan: Home when medically stable and okay with surgery..  Barriers to Discharge: Medical improvement   Consultants:  Hand surgery Plastic surgery Orthopedic surgery PCCM Wound Care Psychiatry GI   Procedures:  12/08/21- I&D and debridement of LUE and L hand 12/07/21- Wound vac change of RUE, I&D of deep abscess of  R upper arm 12/03/21- I&D 12/01/21- I&D Incision  and drainage and debridement right upper extremity/incision and drainage of left upper extremity by Dr. Lenon Curt hand surgeon 11/23/2021. Wound evaluation left upper extremity with dressing change under  sedation/incision and drainage right upper extremity and loose closure of wound, right upper extremity per Dr. Lenon Curt, hand surgeon 11/18/2021 Right hand fasciotomy/right hand carpal tunnel release/right hand renal scan no release/right forearm 3 compartment fasciotomy/thorough irrigation of hand and forearm/dorsal hand incision and drainage including extensor tendon sheath per Dr. Lenon Curt, hand surgeon 11/17/2021 Central line insertion per PCCM, Dr. Tamala Julian 11/17/2021 Upper endoscopy per GI, Dr. Watt Climes 11/17/2021 Left wrist deep abscess incision and drainage/left volar forearm fasciotomy/left wrist carpal tunnel release extended with Guyon's canal release--per Dr. Greta Doom orthopedics 11/15/2021  Antimicrobials:  Daptomycin IV  Subjective: Today, patient was seen and examined at bedside.  Patient complains of pain.  Anxious.    Objective: Vitals:   12/17/21 0139 12/17/21 0228 12/17/21 0439 12/17/21 0628  BP:  (!) 173/82 133/72 (!) 116/55  Pulse: (!) 142 (!) 153 (!) 123 (!) 120  Resp:  '20 20 20  ' Temp:  98.2 F (36.8 C) 98.6 F (37 C) 98.6 F (37 C)  TempSrc:  Oral Oral Oral  SpO2:  97% 90% 93%  Weight:      Height:        Intake/Output Summary (Last 24 hours) at 12/17/2021 1129 Last data filed at 12/17/2021 0406 Gross per 24 hour  Intake 120 ml  Output 2125 ml  Net -2005 ml    Filed Weights   11/14/21 0835 11/15/21 1608 11/25/21 1342  Weight: 60 kg 60 kg 60 kg    Physical examination:  General:  Average built, not in obvious distress, anxious HENT:   No scleral pallor or icterus noted. Oral mucosa is moist.  Chest:  Clear breath sounds.  Diminished breath sounds bilaterally. No crackles or wheezes.  CVS: S1 &S2 heard. No murmur.  Regular rate and rhythm. Abdomen: Soft, nontender, nondistended.  Bowel sounds are heard.   Extremities: Right upper extremity with Ace bandage, left wrist with Ace bandage. Psych: Alert, awake and oriented, normal mood CNS:  No cranial nerve deficits.   Power equal in all extremities.   Skin: Warm and dry.  Upper extremity dressings.  Data Reviewed: I have personally reviewed following labs and imaging studies  CBC: Recent Labs  Lab 12/13/21 0438 12/14/21 0832 12/15/21 0730 12/16/21 0451 12/17/21 0356  WBC 11.3* 11.2* 24.9* 18.3* 14.0*  HGB 9.5* 10.4* 10.3* 9.8* 9.7*  HCT 30.9* 34.2* 33.4* 32.2* 31.7*  MCV 88.3 88.4 87.9 88.5 87.1  PLT 651* 616* 585* 543* 462*    Basic Metabolic Panel: Recent Labs  Lab 12/12/21 0554 12/13/21 0438 12/14/21 0832 12/15/21 0730 12/16/21 0451  NA 135 134* 136 134* 137  K 3.8 3.8 3.9 3.7 3.6  CL 102 103 104 104 104  CO2 '25 24 22 23 24  ' GLUCOSE 113* 119* 97 157* 102*  BUN '17 14 15 17 17  ' CREATININE 0.40* 0.41* 0.45* 0.44* 0.34*  CALCIUM 8.7* 8.5* 8.5* 8.4* 8.5*    GFR: Estimated Creatinine Clearance: 109.4 mL/min (A) (by C-G formula based on SCr of 0.34 mg/dL (L)).  Liver Function Tests: No results for input(s): AST, ALT, ALKPHOS, BILITOT, PROT, ALBUMIN in the last 168 hours.  Cardiac Enzymes: Recent Labs  Lab 12/16/21 0451  CKTOTAL 30*    Scheduled Meds:  (feeding supplement) PROSource Plus  30 mL Oral TID BM   acetaminophen  500 mg Oral Q6H  vitamin C  500 mg Oral Daily   Chlorhexidine Gluconate Cloth  6 each Topical Daily   feeding supplement  237 mL Oral TID BM   ferrous sulfate  325 mg Oral Q breakfast   gabapentin  400 mg Oral TID   nicotine  21 mg Transdermal Daily   pantoprazole  40 mg Oral BID   polyethylene glycol  17 g Oral BID   QUEtiapine  100 mg Oral BID   Continuous Infusions:  DAPTOmycin (CUBICIN)  IV Stopped (12/16/21 2019)     LOS: 33 days    Flora Lipps, MD [facility] 12/17/2021, 11:29 AM

## 2021-12-17 NOTE — Progress Notes (Signed)
Pt resting in bed and VS stabilized after interventions. Pt HR down to 120's; pt in bed with call light within reach. Will continue to closely monitor pt. Reported off to oncoming. Dionne Bucy RN   12/17/21 0439  Vitals  Temp 98.6 F (37 C)  Temp Source Oral  BP 133/72  MAP (mmHg) 91  BP Location Left Leg  BP Method Automatic  Pulse Rate (!) 123  Pulse Rate Source Monitor  Resp 20  MEWS COLOR  MEWS Score Color Yellow  Oxygen Therapy  SpO2 90 %  O2 Device Room Air  MEWS Score  MEWS Temp 0  MEWS Systolic 0  MEWS Pulse 2  MEWS RR 0  MEWS LOC 0  MEWS Score 2

## 2021-12-17 NOTE — Progress Notes (Signed)
PIV consult: L cephalic PIV leaking/ removed. Super-long PIV placed L brachial vein due to L forearm and RUE bandaged, S/P surgical procedures.

## 2021-12-18 DIAGNOSIS — D62 Acute posthemorrhagic anemia: Secondary | ICD-10-CM | POA: Diagnosis not present

## 2021-12-18 DIAGNOSIS — L0390 Cellulitis, unspecified: Secondary | ICD-10-CM | POA: Diagnosis not present

## 2021-12-18 DIAGNOSIS — L03113 Cellulitis of right upper limb: Secondary | ICD-10-CM | POA: Diagnosis not present

## 2021-12-18 DIAGNOSIS — F15951 Other stimulant use, unspecified with stimulant-induced psychotic disorder with hallucinations: Secondary | ICD-10-CM | POA: Diagnosis not present

## 2021-12-18 NOTE — Progress Notes (Signed)
PROGRESS NOTE    Jason Buchanan  LKT:[id number] DOB: 05-27-1986 DOA: 11/14/2021 PCP: Jason Coots, NP     Brief Narrative:   36 year old male with medical history significant for polysubstance abuse including intravenous drug abuse presented to hospital with bilateral arm pain from injection to arms bilaterally.  CT scan of the upper extremity showed extensive cellulitis and was initially started on broad-spectrum antibiotic.  After hospitalization, patient has undergone multiple bilateral upper extremity I&D's by hand surgery and plastic surgery.  Blood culture was also positive for beta Streptococcus.  On 11/07/2021, patient developed melena with active GI bleed requiring PRBC transfusion.  GI was consulted and patient underwent EGD confirming mild proximal mild esophagitis with nonbleeding ulcers, nonbleeding duodenal ulcers which were injected.  Post procedure, he developed respiratory failure requiring intubation and mechanical ventilation and was subsequently extubated on 11/17/2021.  Patient was subsequently considered stable for transfer out of the ICU and is currently in the hospitalist service.    Assessment & Plan:   Sepsis due to cellulitis/continued pain.  Continue daptomycin until discharge.  Plan to transition to Zyvox on discharge.  Patient has been seen by plastic surgery, orthopedics and hand surgery.  Currently on Dilaudid,Tylenol oxycodone and gabapentin.  Patient continues to require IV Dilaudid for adequate control of pain.  Patient has had several I&D's during hospitalization and underwent debridement in preparation for skin graft with placement of Myriad to the right  arm open wound and wound VAC by plastic surgery on 12/14/2021, follow-up with plastic surgery.  Dose of gabapentin was recently increased for pain management.  Surgery to follow during hospitalization.            Polysubstance abuse  History of IV drug abuse.  Patient wishes to undergo drug treatment program  after the hospitalization but has had amphetamine use in the hospital.    Hypokalemia Latest potassium of 3.6.     Streptococcal bacteremia ID has seen the patient, patient had received antibiotic.  No plan for TEE.     Right arm cellulitis Status post I&D and surgery by plastic, orthopedic and hand surgery.  Further surgical intervention as per surgery.  Still has a wound VAC and recently had graft placed    Staphylococcus aureus infection Currently on daptomycin, pharmacy to dose until discharge.  Patient will need to continue Zyvox 600 mg twice daily on transition.  We will check with ID regarding this prior to discharge.  Monitor CBC closely including CK levels.    Acute blood loss anemia due to upper GI bleed. Hold Toradol and NSAIDs.  Continue to monitor symptoms.   Latest hemoglobin of 9.7.   Postoperative respiratory failure. Resolved at this time.     Amphetamine and psychostimulant-induced psychosis with hallucinations  Continue Seroquel and trazodone.  Patient has had amphetamine use in the hospital and was counseled about it.     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   DVT prophylaxis: SCDs  Code Status: Full code  Family Communication:  Spoke with the patient at bedside  Disposition Plan: Home when medically stable and okay with surgery..  Barriers to Discharge: Medical improvement   Consultants:  Hand surgery Plastic surgery Orthopedic surgery PCCM Wound Care Psychiatry GI   Procedures:  12/08/21- I&D and debridement of LUE and L hand 12/07/21- Wound vac change of RUE, I&D of deep abscess of  R upper arm 12/03/21- I&D 12/01/21- I&D  Incision and drainage and debridement right upper extremity/incision and drainage of left upper extremity by Dr. Lenon Buchanan hand surgeon 11/23/2021. Wound evaluation left upper extremity with dressing change under  sedation/incision and drainage right upper extremity and loose closure of wound, right upper extremity per Dr. Lenon Buchanan, hand surgeon 11/18/2021 Right hand fasciotomy/right hand carpal tunnel release/right hand renal scan no release/right forearm 3 compartment fasciotomy/thorough irrigation of hand and forearm/dorsal hand incision and drainage including extensor tendon sheath per Dr. Lenon Buchanan, hand surgeon 11/17/2021 Central line insertion per PCCM, Dr. Tamala Buchanan 11/17/2021 Upper endoscopy per GI, Dr. Watt Buchanan 11/17/2021 Left wrist deep abscess incision and drainage/left volar forearm fasciotomy/left wrist carpal tunnel release extended with Guyon's canal release--per Dr. Greta Buchanan orthopedics 11/15/2021  Antimicrobials:  Daptomycin IV  Subjective: Today, patient was seen and examined at bedside.  Complains of mild pain but overall feels better.  Denies any nausea vomiting fever chills or rigor.   Objective: Vitals:   12/17/21 1441 12/17/21 1445 12/17/21 2039 12/18/21 0504  BP: 115/64 115/64 (!) 149/87 123/60  Pulse: 91 77 (!) 102 99  Resp: 16 16    Temp: (!) 97.3 F (36.3 C) (!) 97.3 F (36.3 C) (!) 97.5 F (36.4 C) 97.8 F (36.6 C)  TempSrc:   Oral Oral  SpO2: 97% 100% 100% 100%  Weight:      Height:        Intake/Output Summary (Last 24 hours) at 12/18/2021 0824 Last data filed at 12/18/2021 0215 Gross per 24 hour  Intake 660 ml  Output 375 ml  Net 285 ml    Filed Weights   11/14/21 0835 11/15/21 1608 11/25/21 1342  Weight: 60 kg 60 kg 60 kg    Physical examination:  General:  Average built, not in obvious distress, anxious HENT:   No scleral pallor or icterus noted. Oral mucosa is moist.  Chest:  Clear breath sounds.  Diminished breath sounds bilaterally. No crackles or wheezes.  CVS: S1 &S2 heard. No murmur.  Regular rate and rhythm. Abdomen: Soft, nontender, nondistended.  Bowel sounds are heard.   Extremities:  right upper extremity Ace bandage, ACE over left wrist  Psych:  Alert, awake and oriented, normal mood CNS:  No cranial nerve deficits.  Power equal in all extremities.   Skin: Warm and dry.  Upper extremity Ace bandages, distal capillary refill present.   Data Reviewed: I have personally reviewed following labs and imaging studies  CBC: Recent Labs  Lab 12/13/21 0438 12/14/21 0832 12/15/21 0730 12/16/21 0451 12/17/21 0356  WBC 11.3* 11.2* 24.9* 18.3* 14.0*  HGB 9.5* 10.4* 10.3* 9.8* 9.7*  HCT 30.9* 34.2* 33.4* 32.2* 31.7*  MCV 88.3 88.4 87.9 88.5 87.1  PLT 651* 616* 585* 543* 462*    Basic Metabolic Panel: Recent Labs  Lab 12/12/21 0554 12/13/21 0438 12/14/21 0832 12/15/21 0730 12/16/21 0451  NA 135 134* 136 134* 137  K 3.8 3.8 3.9 3.7 3.6  CL 102 103 104 104 104  CO2 '25 24 22 23 24  ' GLUCOSE 113* 119* 97 157* 102*  BUN '17 14 15 17 17  ' CREATININE 0.40* 0.41* 0.45* 0.44* 0.34*  CALCIUM 8.7* 8.5* 8.5* 8.4* 8.5*    GFR: Estimated Creatinine Clearance: 109.4 mL/min (A) (by C-G formula based on SCr of 0.34 mg/dL (L)).  Liver Function Tests: No results for input(s): AST, ALT, ALKPHOS, BILITOT, PROT, ALBUMIN in the last 168 hours.  Cardiac Enzymes: Recent Labs  Lab 12/16/21 0451  CKTOTAL 30*    Scheduled Meds:  (feeding  supplement) PROSource Plus  30 mL Oral TID BM   acetaminophen  500 mg Oral Q6H   vitamin C  500 mg Oral Daily   Chlorhexidine Gluconate Cloth  6 each Topical Daily   feeding supplement  237 mL Oral TID BM   ferrous sulfate  325 mg Oral Q breakfast   gabapentin  400 mg Oral TID   nicotine  21 mg Transdermal Daily   pantoprazole  40 mg Oral BID   polyethylene glycol  17 g Oral BID   QUEtiapine  100 mg Oral BID   Continuous Infusions:  DAPTOmycin (CUBICIN)  IV 500 mg (12/17/21 1948)     LOS: 34 days    Jason Lipps, MD [facility] 12/18/2021, 8:24 AM

## 2021-12-18 NOTE — Plan of Care (Signed)
Aox4, cooperative with staff.  Wound vac and dressing changes per surgical services. Neuro checks unchanged/intact.  IV abx and pain regimen per orders.   Problem: Clinical Measurements: Goal: Ability to maintain clinical measurements within normal limits will improve Outcome: Progressing   Problem: Activity: Goal: Risk for activity intolerance will decrease Outcome: Progressing   Problem: Nutrition: Goal: Adequate nutrition will be maintained Outcome: Progressing   Problem: Coping: Goal: Level of anxiety will decrease Outcome: Progressing   Problem: Pain Managment: Goal: General experience of comfort will improve Outcome: Progressing   Problem: Safety: Goal: Ability to remain free from injury will improve Outcome: Progressing

## 2021-12-18 NOTE — Progress Notes (Signed)
IV access occluded and leaking.  RN attempted catheter reposition with new dressing, no improvement.  IV removed.  IV team consult placed.  Pt educated on oral pain regimen until new access can be gained.

## 2021-12-19 ENCOUNTER — Inpatient Hospital Stay: Payer: Self-pay

## 2021-12-19 DIAGNOSIS — F15951 Other stimulant use, unspecified with stimulant-induced psychotic disorder with hallucinations: Secondary | ICD-10-CM | POA: Diagnosis not present

## 2021-12-19 DIAGNOSIS — D62 Acute posthemorrhagic anemia: Secondary | ICD-10-CM | POA: Diagnosis not present

## 2021-12-19 DIAGNOSIS — L03113 Cellulitis of right upper limb: Secondary | ICD-10-CM | POA: Diagnosis not present

## 2021-12-19 DIAGNOSIS — L0390 Cellulitis, unspecified: Secondary | ICD-10-CM | POA: Diagnosis not present

## 2021-12-19 NOTE — Progress Notes (Signed)
5 Days Post-Op  °Subjective: °35-year-old male status postdebridement of right arm with application of wound matrix and wound VAC with Dr. Luppens on 12/14/2021.  Patient is doing well, up out of bed ambulating.  RN at bedside.  Reports pain is well controlled, has some questions about further surgical plans. ° °Objective: °Vital signs in last 24 hours: °Temp:  [98.1 °F (36.7 °C)-98.2 °F (36.8 °C)] 98.2 °F (36.8 °C) (01/16 0411) °Pulse Rate:  [101] 101 (01/16 0411) °Resp:  [20] 20 (01/15 2003) °BP: (150-155)/(72-74) 150/74 (01/16 0411) °SpO2:  [100 %] 100 % (01/16 0411) °Last BM Date: 12/18/21 ° °Intake/Output from previous day: °01/15 0701 - 01/16 0700 °In: 1440 [P.O.:1440] °Out: 1000 [Urine:1000] °Intake/Output this shift: °Total I/O °In: 640 [P.O.:520; IV Piggyback:120] °Out: -  ° °General appearance: alert, cooperative, no distress, and standing up next to bed, RN at bedside °Head: Normocephalic, without obvious abnormality, atraumatic °Extremities: Left upper extremity wrapped with Ace wrap, right upper extremity with wound VAC in place, 250 cc of serosanguineous drainage in canister.  Good seal noted.  Palpable radial pulse.  Decreased range of motion of right hand noted, unable to make full fist.  Ace wrap in place over right upper extremity.  No erythema noted proximal or distal to dressing.  No tenderness to palpation over right proximal upper extremity. °Pulses: 2+ and symmetric ° ° °Lab Results:  °CBC Latest Ref Rng & Units 12/17/2021 12/16/2021 12/15/2021  °WBC 4.0 - 10.5 K/uL 14.0(H) 18.3(H) 24.9(H)  °Hemoglobin 13.0 - 17.0 g/dL 9.7(L) 9.8(L) 10.3(L)  °Hematocrit 39.0 - 52.0 % 31.7(L) 32.2(L) 33.4(L)  °Platelets 150 - 400 K/uL 462(H) 543(H) 585(H)  °  °BMET °No results for input(s): NA, K, CL, CO2, GLUCOSE, BUN, CREATININE, CALCIUM in the last 72 hours. °PT/INR °No results for input(s): LABPROT, INR in the last 72 hours. °ABG °No results for input(s): PHART, HCO3 in the last 72 hours. ° °Invalid input(s):  PCO2, PO2 ° °Studies/Results: °US EKG SITE RITE ° °Result Date: 12/19/2021 °If Site Rite image not attached, placement could not be confirmed due to current cardiac rhythm.  ° °Anti-infectives: °Anti-infectives (From admission, onward)  ° ° Start     Dose/Rate Route Frequency Ordered Stop  ° 11/17/21 1700  cefTRIAXone (ROCEPHIN) 2 g in sodium chloride 0.9 % 100 mL IVPB  Status:  Discontinued       ° 2 g °200 mL/hr over 30 Minutes Intravenous Every 24 hours 11/17/21 1019 11/21/21 1047  ° 11/17/21 1700  DAPTOmycin (CUBICIN) 500 mg in sodium chloride 0.9 % IVPB       ° 8 mg/kg × 60 kg °120 mL/hr over 30 Minutes Intravenous Daily 11/17/21 1555    ° 11/17/21 1200  metroNIDAZOLE (FLAGYL) IVPB 500 mg  Status:  Discontinued       ° 500 mg °100 mL/hr over 60 Minutes Intravenous Every 12 hours 11/17/21 1019 11/18/21 0924  ° 11/15/21 1000  cefTRIAXone (ROCEPHIN) 2 g in sodium chloride 0.9 % 100 mL IVPB  Status:  Discontinued       ° 2 g °200 mL/hr over 30 Minutes Intravenous Every 24 hours 11/14/21 1834 11/15/21 0405  ° 11/15/21 1000  clindamycin (CLEOCIN) IVPB 600 mg  Status:  Discontinued       ° 600 mg °100 mL/hr over 30 Minutes Intravenous Every 8 hours 11/15/21 0405 11/15/21 1523  ° 11/15/21 0800  penicillin G potassium 12 Million Units in dextrose 5 % 500 mL continuous infusion  Status:  Discontinued       °   12 Million Units °41.7 mL/hr over 12 Hours Intravenous Every 12 hours 11/15/21 0405 11/17/21 1956  ° 11/14/21 2200  vancomycin (VANCOREADY) IVPB 750 mg/150 mL  Status:  Discontinued       ° 750 mg °150 mL/hr over 60 Minutes Intravenous Every 12 hours 11/14/21 1524 11/15/21 0405  ° 11/14/21 0900  vancomycin (VANCOCIN) IVPB 1000 mg/200 mL premix       ° 1,000 mg °200 mL/hr over 60 Minutes Intravenous  Once 11/14/21 0849 11/14/21 1043  ° 11/14/21 0900  cefTRIAXone (ROCEPHIN) 2 g in sodium chloride 0.9 % 100 mL IVPB       ° 2 g °200 mL/hr over 30 Minutes Intravenous  Once 11/14/21 0849 11/14/21 0935  ° °   ° ° °Assessment/Plan: °s/p Procedure(s): °IRRIGATION AND DEBRIDEMENT RIGHT ARM °APPLICATION OF WOUND VAC °APPLICATION OF MYRIAD, ACELL OR INTEGRA ° °Plan for return to the OR on 12/22/2021 with Dr. Luppens for additional irrigation and debridement, reapplication of wound matrix and wound VAC change.  All the patient's questions were answered to his content in regards to the planned surgical procedure.  We discussed that skin grafting may be necessary in the future, however the tendons will need to be covered with healthy granulation tissue prior to this.  Patient was understanding of this. ° °DVT prophylaxis per primary ° ° ° LOS: 35 days  ° ° °Kemuel Buchmann J Lacrecia Delval, PA-C °12/19/2021 ° °

## 2021-12-19 NOTE — Plan of Care (Signed)
Pt aox4, anxious, restless but cooperative with staff.  Wound vac intact  to Right Arm, dressing to left arm intact.  IV abx/pain regimen per orders.  Problem: Clinical Measurements: Goal: Ability to maintain clinical measurements within normal limits will improve Outcome: Progressing   Problem: Clinical Measurements: Goal: Diagnostic test results will improve Outcome: Progressing   Problem: Activity: Goal: Risk for activity intolerance will decrease Outcome: Progressing   Problem: Nutrition: Goal: Adequate nutrition will be maintained Outcome: Progressing   Problem: Coping: Goal: Level of anxiety will decrease Outcome: Progressing  Problem: Pain Managment: Goal: General experience of comfort will improve Outcome: Progressing   Problem: Safety: Goal: Ability to remain free from injury will improve Outcome: Progressing   Problem: Skin Integrity: Goal: Risk for impaired skin integrity will decrease Outcome: Progressing

## 2021-12-19 NOTE — TOC Progression Note (Addendum)
Transition of Care [facility]) - Progression Note    Patient Details  Name: Jason Buchanan MRN: [id number] Date of Birth: [birth year]  [facility] CM/SW Contact  Armanda Heritage, RN Phone Number: 12/19/2021, 10:37 AM  Clinical Narrative:    Referral made to [facility].  Addendum: [facility] has declined, due to possible future need for plastic surgery involvement and that service is not available at [facility].  Expected Discharge Plan:  (currently unknown) Barriers to Discharge: Continued Medical Work up  Expected Discharge Plan and Services Expected Discharge Plan:  (currently unknown)   Discharge Planning Services: CM Consult   Living arrangements for the past 2 months: Homeless Shelter Expected Discharge Date: 11/14/21                                     Social Determinants of Health (SDOH) Interventions    Readmission Risk Interventions No flowsheet data found.

## 2021-12-19 NOTE — Progress Notes (Signed)
Request noted for PICC.  Secure chat with Dr. Tyson Babinski regarding the patient having upper extremity wounds in both right and left arms, both requiring surgical intervention previously and questionable future surgical need.  It is contraindicated to place PICC in either arm for this reason due to risk for clot, impaired circulation and infection.  Recommendation to have IR place tunneled line if central access is needed for prolonged IV need.  Dr. Tyson Babinski has noted this recommendation no further orders at this time.

## 2021-12-19 NOTE — H&P (View-Only) (Signed)
5 Days Post-Op  Subjective: 36 year old male status postdebridement of right arm with application of wound matrix and wound VAC with Dr. Erin Hearing on 12/14/2021.  Patient is doing well, up out of bed ambulating.  RN at bedside.  Reports pain is well controlled, has some questions about further surgical plans.  Objective: Vital signs in last 24 hours: Temp:  [98.1 F (36.7 C)-98.2 F (36.8 C)] 98.2 F (36.8 C) (01/16 0411) Pulse Rate:  [101] 101 (01/16 0411) Resp:  [20] 20 (01/15 2003) BP: (150-155)/(72-74) 150/74 (01/16 0411) SpO2:  [100 %] 100 % (01/16 0411) Last BM Date: 12/18/21  Intake/Output from previous day: 01/15 0701 - 01/16 0700 In: 1440 [P.O.:1440] Out: 1000 [Urine:1000] Intake/Output this shift: Total I/O In: 640 [P.O.:520; IV Piggyback:120] Out: -   General appearance: alert, cooperative, no distress, and standing up next to bed, RN at bedside Head: Normocephalic, without obvious abnormality, atraumatic Extremities: Left upper extremity wrapped with Ace wrap, right upper extremity with wound VAC in place, 250 cc of serosanguineous drainage in canister.  Good seal noted.  Palpable radial pulse.  Decreased range of motion of right hand noted, unable to make full fist.  Ace wrap in place over right upper extremity.  No erythema noted proximal or distal to dressing.  No tenderness to palpation over right proximal upper extremity. Pulses: 2+ and symmetric   Lab Results:  CBC Latest Ref Rng & Units 12/17/2021 12/16/2021 12/15/2021  WBC 4.0 - 10.5 K/uL 14.0(H) 18.3(H) 24.9(H)  Hemoglobin 13.0 - 17.0 g/dL 9.7(L) 9.8(L) 10.3(L)  Hematocrit 39.0 - 52.0 % 31.7(L) 32.2(L) 33.4(L)  Platelets 150 - 400 K/uL 462(H) 543(H) 585(H)    BMET No results for input(s): NA, K, CL, CO2, GLUCOSE, BUN, CREATININE, CALCIUM in the last 72 hours. PT/INR No results for input(s): LABPROT, INR in the last 72 hours. ABG No results for input(s): PHART, HCO3 in the last 72 hours.  Invalid input(s):  PCO2, PO2  Studies/Results: [place] EKG SITE RITE  Result Date: 12/19/2021 If Site Rite image not attached, placement could not be confirmed due to current cardiac rhythm.   Anti-infectives: Anti-infectives (From admission, onward)    Start     Dose/Rate Route Frequency Ordered Stop   11/17/21 1700  cefTRIAXone (ROCEPHIN) 2 g in sodium chloride 0.9 % 100 mL IVPB  Status:  Discontinued        2 g 200 mL/hr over 30 Minutes Intravenous Every 24 hours 11/17/21 1019 11/21/21 1047   11/17/21 1700  DAPTOmycin (CUBICIN) 500 mg in sodium chloride 0.9 % IVPB        8 mg/kg  60 kg 120 mL/hr over 30 Minutes Intravenous Daily 11/17/21 1555     11/17/21 1200  metroNIDAZOLE (FLAGYL) IVPB 500 mg  Status:  Discontinued        500 mg 100 mL/hr over 60 Minutes Intravenous Every 12 hours 11/17/21 1019 11/18/21 0924   11/15/21 1000  cefTRIAXone (ROCEPHIN) 2 g in sodium chloride 0.9 % 100 mL IVPB  Status:  Discontinued        2 g 200 mL/hr over 30 Minutes Intravenous Every 24 hours 11/14/21 1834 11/15/21 0405   11/15/21 1000  clindamycin (CLEOCIN) IVPB 600 mg  Status:  Discontinued        600 mg 100 mL/hr over 30 Minutes Intravenous Every 8 hours 11/15/21 0405 11/15/21 1523   11/15/21 0800  penicillin G potassium 12 Million Units in dextrose 5 % 500 mL continuous infusion  Status:  Discontinued  12 Million Units 41.7 mL/hr over 12 Hours Intravenous Every 12 hours 11/15/21 0405 11/17/21 1956   11/14/21 2200  vancomycin (VANCOREADY) IVPB 750 mg/150 mL  Status:  Discontinued        750 mg 150 mL/hr over 60 Minutes Intravenous Every 12 hours 11/14/21 1524 11/15/21 0405   11/14/21 0900  vancomycin (VANCOCIN) IVPB 1000 mg/200 mL premix        1,000 mg 200 mL/hr over 60 Minutes Intravenous  Once 11/14/21 0849 11/14/21 1043   11/14/21 0900  cefTRIAXone (ROCEPHIN) 2 g in sodium chloride 0.9 % 100 mL IVPB        2 g 200 mL/hr over 30 Minutes Intravenous  Once 11/14/21 0849 11/14/21 0935        Assessment/Plan: s/p Procedure(s): IRRIGATION AND DEBRIDEMENT RIGHT ARM APPLICATION OF WOUND VAC APPLICATION OF MYRIAD, ACELL OR INTEGRA  Plan for return to the OR on 12/22/2021 with Dr. Erin Hearing for additional irrigation and debridement, reapplication of wound matrix and wound VAC change.  All the patient's questions were answered to his content in regards to the planned surgical procedure.  We discussed that skin grafting may be necessary in the future, however the tendons will need to be covered with healthy granulation tissue prior to this.  Patient was understanding of this.  DVT prophylaxis per primary    LOS: 35 days    Charlies Constable, PA-C 12/19/2021

## 2021-12-19 NOTE — Progress Notes (Addendum)
PROGRESS NOTE    Jason Buchanan  LOV:[id number] DOB: 08-15-1986 DOA: 11/14/2021 PCP: [name], NP     Brief Narrative:   36 year old male with medical history significant for polysubstance abuse including intravenous drug abuse presented to hospital with bilateral arm pain from injection to arms bilaterally.  CT scan of the upper extremity showed extensive cellulitis and was initially started on broad-spectrum antibiotic.  After hospitalization, patient has undergone multiple bilateral upper extremity I&D's by hand surgery and plastic surgery.  Blood culture was also positive for beta Streptococcus.  On 11/07/2021, patient developed melena with active GI bleed requiring PRBC transfusion.  GI was consulted and patient underwent EGD confirming mild proximal mild esophagitis with nonbleeding ulcers, nonbleeding duodenal ulcers which were injected.  Post procedure, he developed respiratory failure requiring intubation and mechanical ventilation and was subsequently extubated on 11/17/2021.  Patient was subsequently considered stable for transfer out of the ICU and is currently in the hospitalist service.  Patient has been having multiple surgical intervention during hospitalization.  Assessment & Plan:   Sepsis due to cellulitis/continued pain.   Continue daptomycin until discharge.  Plan to transition to Zyvox on discharge.  Patient has been seen by plastic surgery, orthopedics and hand surgery.  Currently on Dilaudid,Tylenol oxycodone and gabapentin.  Patient continues to require IV Dilaudid for adequate control of pain.  Patient has had several I&D's during hospitalization and underwent debridement in preparation for skin graft with placement of Myriad to the right  arm open wound and wound VAC by plastic surgery on 12/14/2021.  Follow-up plastic surgery recommendations on further intervention if needed.            Polysubstance abuse  History of IV drug abuse.  Patient wishes to undergo drug  treatment program     Hypokalemia Latest potassium of 3.6.     Streptococcal bacteremia ID has seen the patient, patient had received antibiotic.  No plan for TEE.     Right arm cellulitis Status post I&D and surgery by plastic, orthopedic and hand surgery.  Further surgical intervention as per surgery.  Still has a wound VAC and recently had a graft placed on the right upper extremity.    Staphylococcus aureus infection Currently on daptomycin, pharmacy to dose until discharge.  Patient will need to continue Zyvox 600 mg twice daily on transition.  We will check with ID regarding this prior to discharge.  Monitor CBC closely including CK levels.    Acute blood loss anemia due to upper GI bleed. Hold Toradol and NSAIDs.  Continue to monitor symptoms.   Latest hemoglobin of 9.7.   Postoperative respiratory failure. Resolved at this time.     Amphetamine and psychostimulant-induced psychosis with hallucinations  Continue Seroquel and trazodone.  Patient has had amphetamine use in the hospital and was counseled about it.     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  Difficult IV access.  Will likely need PICC line placement due to need for antibiotics and IV narcotics.  Order placed in.   DVT prophylaxis: SCDs  Code Status: Full code  Family Communication:  Spoke with the patient at bedside  Disposition Plan: Home when medically stable and okay with surgery/LTAC due to wound care as needed pain management antibiotics.  TOC aware.  Barriers to Discharge: Medical improvement   Consultants:  Hand surgery Plastic surgery Orthopedic surgery PCCM Wound Care Psychiatry GI  Procedures:  12/08/21- I&D and debridement of LUE and L hand 12/07/21- Wound vac change of RUE, I&D of deep abscess of  R upper arm 12/03/21- I&D 12/01/21- I&D Incision and drainage and debridement  right upper extremity/incision and drainage of left upper extremity by Dr. Lenon Curt hand surgeon 11/23/2021. Wound evaluation left upper extremity with dressing change under sedation/incision and drainage right upper extremity and loose closure of wound, right upper extremity per Dr. Lenon Curt, hand surgeon 11/18/2021 Right hand fasciotomy/right hand carpal tunnel release/right hand renal scan no release/right forearm 3 compartment fasciotomy/thorough irrigation of hand and forearm/dorsal hand incision and drainage including extensor tendon sheath per Dr. Lenon Curt, hand surgeon 11/17/2021 Central line insertion per PCCM, Dr. Tamala Julian 11/17/2021 Upper endoscopy per GI, Dr. Watt Climes 11/17/2021 Left wrist deep abscess incision and drainage/left volar forearm fasciotomy/left wrist carpal tunnel release extended with Guyon's canal release--per Dr. Greta Doom orthopedics 11/15/2021  Antimicrobials:  Daptomycin IV  Subjective: Today, patient was seen and examined at bedside.  Nursing staff reported that he did have a difficult IV line patient still complains of pain.  Denies any nausea vomiting fever chills or rigor.   Objective: Vitals:   12/17/21 2039 12/18/21 0504 12/18/21 2003 12/19/21 0411  BP: (!) 149/87 123/60 (!) 155/72 (!) 150/74  Pulse: (!) 102 99 (!) 101 (!) 101  Resp:   20   Temp: (!) 97.5 F (36.4 C) 97.8 F (36.6 C) 98.1 F (36.7 C) 98.2 F (36.8 C)  TempSrc: Oral Oral Axillary Oral  SpO2: 100% 100% 100% 100%  Weight:      Height:        Intake/Output Summary (Last 24 hours) at 12/19/2021 0812 Last data filed at 12/19/2021 0731 Gross per 24 hour  Intake 1560 ml  Output 1000 ml  Net 560 ml    Filed Weights   11/14/21 0835 11/15/21 1608 11/25/21 1342  Weight: 60 kg 60 kg 60 kg    Physical examination:  General:  Average built, not in obvious distress, anxious HENT:   No scleral pallor or icterus noted. Oral mucosa is moist.  Chest:  Clear breath sounds.  Diminished breath sounds  bilaterally. No crackles or wheezes.  CVS: S1 &S2 heard. No murmur.  Regular rate and rhythm. Abdomen: Soft, nontender, nondistended.  Bowel sounds are heard.   Extremities: Right upper extremity on Ace bandage, left wrist with Ace bandage, able to move fingers with intact distal capillary refill. Psych: Alert, awake and oriented, anxious, fidgety CNS:  No cranial nerve deficits.  Power equal in all extremities.   Skin: Warm and dry.  Upper extremity Ace bandages.   Data Reviewed: I have personally reviewed following labs and imaging studies  CBC: Recent Labs  Lab 12/13/21 0438 12/14/21 0832 12/15/21 0730 12/16/21 0451 12/17/21 0356  WBC 11.3* 11.2* 24.9* 18.3* 14.0*  HGB 9.5* 10.4* 10.3* 9.8* 9.7*  HCT 30.9* 34.2* 33.4* 32.2* 31.7*  MCV 88.3 88.4 87.9 88.5 87.1  PLT 651* 616* 585* 543* 462*    Basic Metabolic Panel: Recent Labs  Lab 12/13/21 0438 12/14/21 0832 12/15/21 0730 12/16/21 0451  NA 134* 136 134* 137  K 3.8 3.9 3.7 3.6  CL 103 104 104 104  CO2 '24 22 23 24  ' GLUCOSE 119* 97 157* 102*  BUN '14 15 17 17  ' CREATININE 0.41* 0.45* 0.44* 0.34*  CALCIUM 8.5* 8.5* 8.4* 8.5*    GFR: Estimated Creatinine Clearance: 109.4 mL/min (A) (by C-G formula based on SCr of 0.34 mg/dL (L)).  Liver Function  Tests: No results for input(s): AST, ALT, ALKPHOS, BILITOT, PROT, ALBUMIN in the last 168 hours.  Cardiac Enzymes: Recent Labs  Lab 12/16/21 0451  CKTOTAL 30*    Scheduled Meds:  (feeding supplement) PROSource Plus  30 mL Oral TID BM   acetaminophen  500 mg Oral Q6H   vitamin C  500 mg Oral Daily   Chlorhexidine Gluconate Cloth  6 each Topical Daily   feeding supplement  237 mL Oral TID BM   ferrous sulfate  325 mg Oral Q breakfast   gabapentin  400 mg Oral TID   nicotine  21 mg Transdermal Daily   pantoprazole  40 mg Oral BID   polyethylene glycol  17 g Oral BID   QUEtiapine  100 mg Oral BID   Continuous Infusions:  DAPTOmycin (CUBICIN)  IV Stopped (12/18/21  2046)     LOS: 35 days    Flora Lipps, MD [facility] 12/19/2021, 8:12 AM

## 2021-12-19 NOTE — Progress Notes (Signed)
VAST consult received to place midline. Patent with bilateral lower arm dressings and right upper arm dressing. Assessed patient's left upper arm utilizing ultrasound; small cephalic vein noted, but too small for midline placement. Basilic vein noted to be compressible and appropriate size for access.  Contacted unit RN and physician and stated my concern with placing a midline is that it is his last option for access; requested PICC placement be considered if patient needs long term access. Physician responded by ordering PICC.

## 2021-12-20 DIAGNOSIS — L03113 Cellulitis of right upper limb: Secondary | ICD-10-CM | POA: Diagnosis not present

## 2021-12-20 DIAGNOSIS — L0390 Cellulitis, unspecified: Secondary | ICD-10-CM | POA: Diagnosis not present

## 2021-12-20 DIAGNOSIS — D62 Acute posthemorrhagic anemia: Secondary | ICD-10-CM | POA: Diagnosis not present

## 2021-12-20 DIAGNOSIS — F15951 Other stimulant use, unspecified with stimulant-induced psychotic disorder with hallucinations: Secondary | ICD-10-CM | POA: Diagnosis not present

## 2021-12-20 LAB — BASIC METABOLIC PANEL
Anion gap: 7 (ref 5–15)
BUN: 14 mg/dL (ref 6–20)
CO2: 24 mmol/L (ref 22–32)
Calcium: 8.6 mg/dL — ABNORMAL LOW (ref 8.9–10.3)
Chloride: 102 mmol/L (ref 98–111)
Creatinine, Ser: 0.48 mg/dL — ABNORMAL LOW (ref 0.61–1.24)
GFR, Estimated: >60 mL/min (ref >60)
Glucose, Bld: 119 mg/dL — ABNORMAL HIGH (ref 70–99)
Potassium: 4.2 mmol/L (ref 3.5–5.1)
Sodium: 133 mmol/L — ABNORMAL LOW (ref 135–145)

## 2021-12-20 LAB — CBC
HCT: 32.4 % — ABNORMAL LOW (ref 39.0–52.0)
Hemoglobin: 10.2 g/dL — ABNORMAL LOW (ref 13.0–17.0)
MCH: 26.7 pg (ref 26.0–34.0)
MCHC: 31.5 g/dL (ref 30.0–36.0)
MCV: 84.8 fL (ref 80.0–100.0)
Platelets: 368 10*3/uL (ref 150–400)
RBC: 3.82 MIL/uL — ABNORMAL LOW (ref 4.22–5.81)
RDW: 14.5 % (ref 11.5–15.5)
WBC: 9.3 10*3/uL (ref 4.0–10.5)
nRBC: 0 % (ref 0.0–0.2)

## 2021-12-20 LAB — MAGNESIUM: Magnesium: 1.6 mg/dL — ABNORMAL LOW (ref 1.7–2.4)

## 2021-12-20 MED ORDER — MAGNESIUM OXIDE -MG SUPPLEMENT 400 (240 MG) MG PO TABS
400.0000 mg | ORAL_TABLET | Freq: Two times a day (BID) | ORAL | Status: DC
  Administered 2021-12-20 – 2022-01-17 (×55): 400 mg via ORAL
  Filled 2021-12-20 (×55): qty 1

## 2021-12-20 NOTE — Progress Notes (Signed)
Patient refused morning labs and dressing change. Requested that labs and dressing change be done after breakfast.

## 2021-12-20 NOTE — Plan of Care (Addendum)
Pt aox4, anxious, restless but cooperative with staff.  Wound vac to Right Arm.  Dressing to left arm changed by night shift RN, CDI.  Plans for OR tomorrow for wound vac change and additional intervention.  IV abx/pain regimen per orders.  Problem: Clinical Measurements: Goal: Signs and symptoms of infection will decrease Outcome: Progressing   Problem: Safety: Goal: Ability to remain free from injury will improve Outcome: Progressing   Problem: Pain Managment: Goal: General experience of comfort will improve Outcome: Progressing   Problem: Coping: Goal: Level of anxiety will decrease Outcome: Progressing   Problem: Activity: Goal: Risk for activity intolerance will decrease Outcome: Progressing   Problem: Nutrition: Goal: Adequate nutrition will be maintained Outcome: Progressing

## 2021-12-20 NOTE — TOC Progression Note (Signed)
Transition of Care [facility]) - Progression Note    Patient Details  Name: Jason Buchanan MRN: [id number] Date of Birth: 1986-02-21  Transition [facility]) CM/SW Contact  Darleene Cleaver, [place] Phone Number: 12/20/2021, 6:26 PM  Clinical Narrative:    Patient being followed by difficult to place team.  Patient still has not bed offers, and was denied for LTAC.  TOC to continue to follow patient's progress throughout discharge planning.   Expected Discharge Plan:  (currently unknown) Barriers to Discharge: Continued Medical Work up  Expected Discharge Plan and Services Expected Discharge Plan:  (currently unknown)   Discharge Planning Services: CM Consult   Living arrangements for the past 2 months: Homeless Shelter Expected Discharge Date: 11/14/21                                     Social Determinants of Health (SDOH) Interventions    Readmission Risk Interventions No flowsheet data found.

## 2021-12-20 NOTE — Progress Notes (Signed)
PROGRESS NOTE    Jason Buchanan  VHQ:[id number] DOB: 1986-04-18 DOA: 11/14/2021 PCP: [name], NP     Brief Narrative:   36 year old male with medical history significant for polysubstance abuse including intravenous drug abuse presented to hospital with bilateral arm pain from injection to arms bilaterally.  CT scan of the upper extremity showed extensive cellulitis and was initially started on broad-spectrum antibiotic.  After hospitalization, patient has undergone multiple bilateral upper extremity I&D's by hand surgery and plastic surgery.  Blood culture was also positive for beta Streptococcus.  On 11/07/2021, patient developed melena with active GI bleed requiring PRBC transfusion.  GI was consulted and patient underwent EGD confirming mild proximal mild esophagitis with nonbleeding ulcers, nonbleeding duodenal ulcers which were injected.  Post procedure, he developed respiratory failure requiring intubation and mechanical ventilation and was subsequently extubated on 11/17/2021.  Patient was subsequently considered stable for transfer out of the ICU and is currently in the hospitalist service.  Patient has been having multiple surgical intervention during hospitalization.  Assessment & Plan:   Sepsis due to cellulitis/continued pain.   Continue daptomycin until discharge.  Plan to transition to Zyvox on discharge.  Patient has been seen by plastic surgery, orthopedics and hand surgery.  Currently on Dilaudid,Tylenol oxycodone and gabapentin.  Patient continues to require IV Dilaudid for adequate control of pain.  Patient has had several I&D's during hospitalization and underwent debridement in preparation for skin graft with placement of Myriad to the right  arm open wound and wound VAC by plastic surgery on 12/14/2021.  Plastic surgery planning for further operative intervention on 12/22/2021.            Polysubstance abuse  History of IV drug abuse.  Patient wishes to undergo drug  treatment program     Hypokalemia Latest potassium of 3.6.  Mild hypomagnesemia.  We will replace with magnesium oxide.     Streptococcal bacteremia ID has seen the patient, patient had received antibiotic.  No plan for TEE.     Right arm cellulitis Status post I&D and surgery by plastic, orthopedic and hand surgery.  Further surgical intervention as per surgery.  Still has a wound VAC and recently had a graft placed on the right upper extremity.  Further operative intervention as per plastic surgery.    Staphylococcus aureus infection Currently on daptomycin, pharmacy to dose until discharge.  Patient will need to continue Zyvox 600 mg twice daily on transition.  We will check with ID regarding this prior to discharge.  Monitor CBC closely including CK levels.    Acute blood loss anemia due to upper GI bleed. Hold Toradol and NSAIDs.  Continue to monitor symptoms.   Latest hemoglobin of 9.7.   Postoperative respiratory failure. Resolved at this time.     Amphetamine and psychostimulant-induced psychosis with hallucinations  Continue Seroquel and trazodone.  Patient has had amphetamine use in the hospital and was counseled about it.  Tachycardia has improved at this time.     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  Difficult IV access.  Patient is not a good candidate for midline or PICC line placement due to extremity infection.  Will likely need central line for IV medications and antibiotic since is likely to stay in the hospital for few more days.   DVT prophylaxis: SCDs  Code Status: Full code  Family Communication:  Spoke with the patient at bedside  Disposition Plan: Home when medically stable and okay with surgery/LTAC due to wound care, pain management antibiotics.  TOC on board.  Barriers to Discharge: Medical improvement   Consultants:  Hand  surgery Plastic surgery Orthopedic surgery PCCM Wound Care Psychiatry GI   Procedures:  12/08/21- I&D and debridement of LUE and L hand 12/07/21- Wound vac change of RUE, I&D of deep abscess of  R upper arm 12/03/21- I&D 12/01/21- I&D Incision and drainage and debridement right upper extremity/incision and drainage of left upper extremity by Dr. Lenon Curt hand surgeon 11/23/2021. Wound evaluation left upper extremity with dressing change under sedation/incision and drainage right upper extremity and loose closure of wound, right upper extremity per Dr. Lenon Curt, hand surgeon 11/18/2021 Right hand fasciotomy/right hand carpal tunnel release/right hand renal scan no release/right forearm 3 compartment fasciotomy/thorough irrigation of hand and forearm/dorsal hand incision and drainage including extensor tendon sheath per Dr. Lenon Curt, hand surgeon 11/17/2021 Central line insertion per PCCM, Dr. Tamala Julian 11/17/2021 Upper endoscopy per GI, Dr. Watt Climes 11/17/2021 Left wrist deep abscess incision and drainage/left volar forearm fasciotomy/left wrist carpal tunnel release extended with Guyon's canal release--per Dr. Greta Doom orthopedics 11/15/2021  Antimicrobials:  Daptomycin IV  Subjective: Today, patient was seen and examined at bedside.  Patient denies any fever chills or rigor.  Has mild pain.  No other complaints.   Objective: Vitals:   12/19/21 0411 12/19/21 1344 12/19/21 1952 12/20/21 0620  BP: (!) 150/74 133/68 (!) 155/83 (!) 145/75  Pulse: (!) 101 95 (!) 103 97  Resp:  _0 Temp: 98.2 F (36.8 C) 97.9 F (36.6 C) 97.8 F (36.6 C) 97.8 F (36.6 C)  TempSrc: Oral Oral Oral Oral  SpO2: 100% 100% 100% 96%  Weight:      Height:        Intake/Output Summary (Last 24 hours) at 12/20/2021 1147 Last data filed at 12/20/2021 0900 Gross per 24 hour  Intake 660 ml  Output 1650 ml  Net -990 ml    Filed Weights   11/14/21 0835 11/15/21 1608 11/25/21 1342  Weight: 60 kg 60 kg 60 kg     Physical examination:  General:  Average built, not in obvious distress, anxious HENT:   No scleral pallor or icterus noted. Oral mucosa is moist.  Chest:  Clear breath sounds.  Diminished breath sounds bilaterally. No crackles or wheezes.  CVS: S1 &S2 heard. No murmur.  Regular rate and rhythm. Abdomen: Soft, nontender, nondistended.  Bowel sounds are heard.   Extremities: Bilateral upper extremity on Ace bandage wrap.  Distal capillary refill present. Psych: Alert, awake and oriented, anxious CNS:  No cranial nerve deficits.  Power equal in all extremities.   Skin: Warm and dry.  Upper extremity on Ace bandages.  Data Reviewed: I have personally reviewed following labs and imaging studies  CBC: Recent Labs  Lab 12/14/21 0832 12/15/21 0730 12/16/21 0451 12/17/21 0356 12/20/21 0720  WBC 11.2* 24.9* 18.3* 14.0* 9.3  HGB 10.4* 10.3* 9.8* 9.7* 10.2*  HCT 34.2* 33.4* 32.2* 31.7* 32.4*  MCV 88.4 87.9 88.5 87.1 84.8  PLT 616* 585* 543* 462* 440    Basic Metabolic Panel: Recent Labs  Lab 12/14/21 0832 12/15/21 0730 12/16/21 0451 12/20/21 0720  NA 136 134* 137 133*  K 3.9 3.7 3.6 4.2  CL 104 104 104 102  CO2 _1 GLUCOSE 97 157* 102* 119*  BUN _2 CREATININE 0.45* 0.44* 0.34* 0.48*  CALCIUM 8.5* 8.4* 8.5* 8.6*  MG  --   --   --  1.6*    GFR: Estimated Creatinine Clearance: 109.4 mL/min (A) (by C-G formula based on SCr of 0.48 mg/dL (L)).  Liver Function Tests: No results for input(s): AST, ALT, ALKPHOS, BILITOT, PROT, ALBUMIN in the last 168 hours.  Cardiac Enzymes: Recent Labs  Lab 12/16/21 0451  CKTOTAL 30*    Scheduled Meds:  (feeding supplement) PROSource Plus  30 mL Oral TID BM   acetaminophen  500 mg Oral Q6H   vitamin C  500 mg Oral Daily   Chlorhexidine Gluconate Cloth  6 each Topical Daily   feeding supplement  237 mL Oral TID BM   ferrous sulfate  325 mg Oral Q breakfast   gabapentin  400 mg Oral TID   nicotine  21 mg  Transdermal Daily   pantoprazole  40 mg Oral BID   polyethylene glycol  17 g Oral BID   QUEtiapine  100 mg Oral BID   Continuous Infusions:  DAPTOmycin (CUBICIN)  IV Stopped (12/19/21 2045)     LOS: 36 days    Flora Lipps, MD [facility] 12/20/2021, 11:47 AM

## 2021-12-21 DIAGNOSIS — L03113 Cellulitis of right upper limb: Secondary | ICD-10-CM | POA: Diagnosis not present

## 2021-12-21 DIAGNOSIS — D62 Acute posthemorrhagic anemia: Secondary | ICD-10-CM | POA: Diagnosis not present

## 2021-12-21 DIAGNOSIS — F15951 Other stimulant use, unspecified with stimulant-induced psychotic disorder with hallucinations: Secondary | ICD-10-CM | POA: Diagnosis not present

## 2021-12-21 DIAGNOSIS — L0390 Cellulitis, unspecified: Secondary | ICD-10-CM | POA: Diagnosis not present

## 2021-12-21 NOTE — Progress Notes (Signed)
PROGRESS NOTE    Jason Buchanan  RFF:[id number] DOB: [birth year] DOA: 11/14/2021 PCP: [name], NP     Brief Narrative:   36 year old male with medical history significant for polysubstance abuse including intravenous drug abuse presented to hospital with bilateral arm pain from injection to arms bilaterally.  CT scan of the upper extremity showed extensive cellulitis and was initially started on broad-spectrum antibiotic.  After hospitalization, patient has undergone multiple bilateral upper extremity I&D's by hand surgery and plastic surgery.  Blood culture was also positive for beta Streptococcus.  On 11/07/2021, patient developed melena with active GI bleed requiring PRBC transfusion.  GI was consulted and patient underwent EGD confirming mild proximal mild esophagitis with nonbleeding ulcers, nonbleeding duodenal ulcers which were injected.  Post procedure, he developed respiratory failure requiring intubation and mechanical ventilation and was subsequently extubated on 11/17/2021.  Patient was subsequently considered stable for transfer out of the ICU and is currently in the hospitalist service.  Patient has been having multiple surgical intervention during hospitalization.  Assessment & Plan:   Sepsis due to cellulitis/continued pain.   Continue daptomycin until discharge.  Plan to transition to Zyvox on discharge.  Patient has been seen by plastic surgery, orthopedics and hand surgery.  Currently on Dilaudid,Tylenol oxycodone and gabapentin.  Patient continues to require IV Dilaudid for adequate control of pain.  Patient has had several I&D's during hospitalization and underwent debridement in preparation for skin graft with placement of Myriad to the right  arm open wound and wound VAC by plastic surgery on 12/14/2021.  Plastic surgery planning for further operative intervention on 12/22/2021.            Polysubstance abuse  History of IV drug abuse.  Patient wishes to undergo drug  treatment program     Hypokalemia Latest potassium of 4.2  Mild hypomagnesemia.  Continue magnesium oxide.     Streptococcal bacteremia ID has seen the patient, patient had received antibiotic.  No plan for TEE.     Right arm cellulitis Status post I&D and surgery by plastic, orthopedic and hand surgery.     Further operative intervention as per plastic surgery.    Staphylococcus aureus infection Currently on daptomycin, pharmacy to dose until discharge.  Patient will need to continue Zyvox 600 mg twice daily on transition.  We will check with ID regarding this prior to discharge.  Monitor CBC closely including CK levels.    Acute blood loss anemia due to upper GI bleed. Hold Toradol and NSAIDs.  Continue to monitor symptoms.   Latest hemoglobin of 10.2  Postoperative respiratory failure. Resolved at this time.     Amphetamine and psychostimulant-induced psychosis with hallucinations  Continue Seroquel and trazodone.  Patient has had amphetamine use in the hospital and was counseled about it.  Tachycardia has improved at this time.     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  Difficult IV access.  Patient is not a good candidate for midline or PICC line placement due to extremity infection.  Will likely need central line for IV medications and antibiotic since he is likely to stay in the hospital for few more days. Will try to avoid if possible.   DVT prophylaxis: SCDs  Code Status: Full code  Family Communication:  Spoke with the patient at bedside  Disposition Plan: Home when medically stable and okay with surgery . Has been denied by [facility]  Barriers to Discharge: Medical improvement   Consultants:  Hand surgery Plastic surgery Orthopedic surgery PCCM Wound Care Psychiatry GI   Procedures:  12/08/21- I&D and debridement of LUE and L hand 12/07/21- Wound vac  change of RUE, I&D of deep abscess of  R upper arm 12/03/21- I&D 12/01/21- I&D Incision and drainage and debridement right upper extremity/incision and drainage of left upper extremity by Dr. Lenon Curt hand surgeon 11/23/2021. Wound evaluation left upper extremity with dressing change under sedation/incision and drainage right upper extremity and loose closure of wound, right upper extremity per Dr. Lenon Curt, hand surgeon 11/18/2021 Right hand fasciotomy/right hand carpal tunnel release/right hand renal scan no release/right forearm 3 compartment fasciotomy/thorough irrigation of hand and forearm/dorsal hand incision and drainage including extensor tendon sheath per Dr. Lenon Curt, hand surgeon 11/17/2021 Central line insertion per PCCM, Dr. Tamala Julian 11/17/2021 Upper endoscopy per GI, Dr. Watt Climes 11/17/2021 Left wrist deep abscess incision and drainage/left volar forearm fasciotomy/left wrist carpal tunnel release extended with Guyon's canal release--per Dr. Greta Doom orthopedics 11/15/2021  Antimicrobials:  Daptomycin IV  Subjective: Today, patient was seen and examined at bedside.  Mild pain.  Denies any nausea vomiting fever chills or rigor.    Objective: Vitals:   12/20/21 0620 12/20/21 1343 12/20/21 2000 12/21/21 0413  BP: (!) 145/75 127/82 92/71 123/70  Pulse: 97 93 99 94  Resp: '18 16 20 20  ' Temp: 97.8 F (36.6 C) (!) 97.4 F (36.3 C) 97.7 F (36.5 C) (!) 97.5 F (36.4 C)  TempSrc: Oral Oral Oral Oral  SpO2: 96% 99% 100% 97%  Weight:      Height:        Intake/Output Summary (Last 24 hours) at 12/21/2021 1032 Last data filed at 12/21/2021 0700 Gross per 24 hour  Intake 300 ml  Output 2875 ml  Net -2575 ml    Filed Weights   11/14/21 0835 11/15/21 1608 11/25/21 1342  Weight: 60 kg 60 kg 60 kg    Physical examination:  General:  Average built, not in obvious distress, appears mildly anxious HENT:   No scleral pallor or icterus noted. Oral mucosa is moist.  Chest:  Clear breath sounds.   Diminished breath sounds bilaterally. No crackles or wheezes.  CVS: S1 &S2 heard. No murmur.  Regular rate and rhythm. Abdomen: Soft, nontender, nondistended.  Bowel sounds are heard.   Extremities: Bilateral upper extremity is bandaged wrapped.  Distal capillary refill present. Psych: Alert, awake and oriented, slightly anxious, CNS:  No cranial nerve deficits.  Power equal in all extremities.   Skin: Warm and dry.  Upper extremity on Ace wrap   Data Reviewed: I have personally reviewed following labs and imaging studies  CBC: Recent Labs  Lab 12/15/21 0730 12/16/21 0451 12/17/21 0356 12/20/21 0720  WBC 24.9* 18.3* 14.0* 9.3  HGB 10.3* 9.8* 9.7* 10.2*  HCT 33.4* 32.2* 31.7* 32.4*  MCV 87.9 88.5 87.1 84.8  PLT 585* 543* 462* 355    Basic Metabolic Panel: Recent Labs  Lab 12/15/21 0730 12/16/21 0451 12/20/21 0720  NA 134* 137 133*  K 3.7 3.6 4.2  CL 104 104 102  CO2 '23 24 24  ' GLUCOSE 157* 102* 119*  BUN '17 17 14  ' CREATININE 0.44* 0.34* 0.48*  CALCIUM 8.4* 8.5* 8.6*  MG  --   --  1.6*    GFR: Estimated Creatinine Clearance: 109.4 mL/min (A) (by C-G formula based on SCr of 0.48 mg/dL (L)).  Liver Function Tests: No results for input(s): AST, ALT, ALKPHOS, BILITOT,  PROT, ALBUMIN in the last 168 hours.  Cardiac Enzymes: Recent Labs  Lab 12/16/21 0451  CKTOTAL 30*    Scheduled Meds:  (feeding supplement) PROSource Plus  30 mL Oral TID BM   acetaminophen  500 mg Oral Q6H   vitamin C  500 mg Oral Daily   Chlorhexidine Gluconate Cloth  6 each Topical Daily   feeding supplement  237 mL Oral TID BM   ferrous sulfate  325 mg Oral Q breakfast   gabapentin  400 mg Oral TID   magnesium oxide  400 mg Oral BID   nicotine  21 mg Transdermal Daily   pantoprazole  40 mg Oral BID   polyethylene glycol  17 g Oral BID   QUEtiapine  100 mg Oral BID   Continuous Infusions:  DAPTOmycin (CUBICIN)  IV 500 mg (12/20/21 2045)     LOS: 37 days    Flora Lipps,  MD [facility] 12/21/2021, 10:32 AM

## 2021-12-21 NOTE — Progress Notes (Addendum)
Nutrition Follow-up  DOCUMENTATION CODES:   Severe malnutrition in context of acute illness/injury  INTERVENTION:   -Ensure Enlive po TID, each supplement provides 350 kcal and 20 grams of protein    -Multivitamin with minerals daily   -500 mg Vitamin daily   -Prosource Plus PO TID, each provides 100 kcals and 15g protein   NUTRITION DIAGNOSIS:   Severe Malnutrition related to acute illness, wound healing as evidenced by severe fat depletion, severe muscle depletion.  Ongoing.  GOAL:   Patient will meet greater than or equal to 90% of their needs  Progressing.  MONITOR:   PO intake, Supplement acceptance, Labs, Weight trends, I & O's, Skin  ASSESSMENT:   36 year old man with history of Polysubstance abuse who presented to the hospital with arm swelling, erythema and pain. Patient is a poor historian but  reports history of injecting multiple drugs in both arms and currently receiving treatment for severe sepsis of right upper extremities complicated with streptococcus bacteremia.  12/12 admitted, NPO ->HH diet 12/13: NPO, s/p I&D of left wrist deep abscess, left volar forearm fasciotomy, left wrist carpal tunnel release 12/14: Regular ->NPO 12/15: s/p right hand fasciotomy, right hand carpal tunnel release, right forearm fasciotomy, I&D of hand and forearm, s/p EGD -esophagitis, gastric and duodenal ulcers 12/17: regular diet 12/20: I&D of BUEs 12/23: I&D of bilateral arms 12/29: I&D of bilateral arms w/ Wound VAC  1/4: Wound VAC change, I&D of deep abscess of rt arm 1/5: I&D of LUE, left hand  1/11: s/p debridement of rt arm, skin graft, wound VAC  Patient currently consuming 100% of meals. Accepting his protein supplements as well. Pt to be NPO tomorrow, 1/19, for another surgical intervention.  Admission weight: 132 lbs.  Medications: Vitamin C, Ferrous sulfate, MAG-OX, Miralax  Labs reviewed: Low Na, Mg  Diet Order:   Diet Order             Diet NPO  time specified  Diet effective midnight           Diet regular Room service appropriate? Yes; Fluid consistency: Thin  Diet effective now                   EDUCATION NEEDS:   Education needs have been addressed  Skin:  Skin Assessment: Skin Integrity Issues: Skin Integrity Issues:: Incisions Incisions: 12/13: bilateral arms  12/15: right arm 12/20 & 12/23: bilateral arms, 12/29: left arm, 1/4 & 1/11: rt upper arm  Last BM:  1/16 -type 3  Height:   Ht Readings from Last 1 Encounters:  11/25/21 5\' 7"  (1.702 m)    Weight:   Wt Readings from Last 1 Encounters:  11/25/21 60 kg    BMI:  Body mass index is 20.72 kg/m.  Estimated Nutritional Needs:   Kcal:  1800-2000  Protein:  95-110g  Fluid:  2L/day  11/27/21, MS, RD, LDN Inpatient Clinical Dietitian Contact information available via Amion

## 2021-12-21 NOTE — Progress Notes (Signed)
Occupational Therapy Treatment Patient Details Name: Jason Buchanan MRN: [id number] DOB: [birth year] Today's Date: 12/21/2021   History of present illness Pt is 36 yo male presented on 12/12 with bil UE edema/erythema after IV drug use.  Pt required R hand fasciotomy, carpal tunnel release, 3 compartment forearm fasciotomy, Guyon's canal release , and I and D hand/forearm on 12/15; and L wrist I and D, left volar forearm fasciotmoy, and L wrist carpal tunnel release on 12/13. On 12/14 concern for GIB and required 3 untis PRBC with ED 12/15.  Pt with hx of polysubstance abuse.   OT comments  Treatment focused on continuing to encourage patient to perform HEP and PROM of digits on each hand. He reports he is. Left MCP ROM does seem to be less stiff with slightly more (approx 5 degrees ROM) noted. Patient appears to compensate well as he independently using urinal and is mostly independent with meal setup Patient provided with theraputty and he demonstrated use. Cautioned to not place putty on fabric.    Recommendations for follow up therapy are one component of a multi-disciplinary discharge planning process, led by the attending physician.  Recommendations may be updated based on patient status, additional functional criteria and insurance authorization.    Follow Up Recommendations  Outpatient OT    Assistance Recommended at Discharge PRN  Patient can return home with the following  A little help with bathing/dressing/bathroom;Assistance with cooking/housework   Equipment Recommendations  None recommended by OT    Recommendations for Other Services      Precautions / Restrictions Precautions Precautions: Fall Precaution Comments: wound vac RUE Restrictions Weight Bearing Restrictions: No Other Position/Activity Restrictions: no restrictions on BUE per Charma Igo PA on secure chat 11/21/21        ADL either performed or assessed with clinical judgement   ADL Overall ADL's :  Needs assistance/impaired Eating/Feeding: Set up Eating/Feeding Details (indicate cue type and reason): mostly independent with food set up - needs assistance for milk carton.                         Toileting- Clothing Manipulation and Hygiene: Independent Toileting - Clothing Manipulation Details (indicate cue type and reason): independent with use of urinal.            Extremity/Trunk Assessment              Vision Patient Visual Report: No change from baseline            Cognition Arousal/Alertness: Awake/alert Behavior During Therapy: WFL for tasks assessed/performed Overall Cognitive Status: Within Functional Limits for tasks assessed                                          Exercises Other Exercises Other Exercises: Patient provided with medium soft theraputty to use. Patient demonstrated use on right hand (limited by ace wrap on left hand). Stretching of MCPS provided to left hand and further encouragement to perform HEP.            Pertinent Vitals/ Pain       Pain Assessment Pain Assessment: Faces Faces Pain Scale: Hurts even more Pain Location: fingers - with PROM Pain Descriptors / Indicators: Discomfort, Grimacing, Guarding, Restless, Moaning Pain Intervention(s): Limited activity within patient's tolerance   Frequency  Min 1X/week        Progress Toward  Goals  OT Goals(current goals can now be found in the care plan section)  Progress towards OT goals: Progressing toward goals  Acute Rehab OT Goals OT Goal Formulation: With patient Time For Goal Achievement: 12/29/21 Potential to Achieve Goals: Good  Plan Discharge plan remains appropriate    Co-evaluation                 AM-PAC OT "6 Clicks" Daily Activity     Outcome Measure   Help from another person eating meals?: None Help from another person taking care of personal grooming?: None Help from another person toileting, which includes using  toliet, bedpan, or urinal?: A Little Help from another person bathing (including washing, rinsing, drying)?: A Little Help from another person to put on and taking off regular upper body clothing?: A Little Help from another person to put on and taking off regular lower body clothing?: A Little 6 Click Score: 20    End of Session Equipment Utilized During Treatment: Other (comment)  OT Visit Diagnosis: Muscle weakness (generalized) (M62.81);Pain   Activity Tolerance Patient limited by pain   Patient Left in bed;with call bell/phone within reach   Nurse Communication          Time: 4008-6761 OT Time Calculation (min): 8 min  Charges: OT General Charges $OT Visit: 1 Visit OT Treatments $Therapeutic Exercise: 8-22 mins  Waldron Session, OTR/L Acute Care Rehab Services  Office [phone] Pager: [phone]   Kelli Churn 12/21/2021, 2:49 PM

## 2021-12-22 ENCOUNTER — Inpatient Hospital Stay (HOSPITAL_COMMUNITY): Payer: 59 | Admitting: Anesthesiology

## 2021-12-22 ENCOUNTER — Encounter (HOSPITAL_COMMUNITY)
Admission: EM | Disposition: A | Discharge status: Home / Self Care | Payer: Self-pay | Source: Home / Self Care | Attending: Internal Medicine

## 2021-12-22 DIAGNOSIS — L03113 Cellulitis of right upper limb: Secondary | ICD-10-CM

## 2021-12-22 DIAGNOSIS — F15951 Other stimulant use, unspecified with stimulant-induced psychotic disorder with hallucinations: Secondary | ICD-10-CM | POA: Diagnosis not present

## 2021-12-22 DIAGNOSIS — D62 Acute posthemorrhagic anemia: Secondary | ICD-10-CM | POA: Diagnosis not present

## 2021-12-22 DIAGNOSIS — L0390 Cellulitis, unspecified: Secondary | ICD-10-CM | POA: Diagnosis not present

## 2021-12-22 DIAGNOSIS — S41101A Unspecified open wound of right upper arm, initial encounter: Secondary | ICD-10-CM

## 2021-12-22 HISTORY — PX: APPLICATION OF WOUND VAC: SHX5189

## 2021-12-22 HISTORY — PX: INCISION AND DRAINAGE OF WOUND: SHX1803

## 2021-12-22 SURGERY — IRRIGATION AND DEBRIDEMENT WOUND
Anesthesia: General | Site: Arm Lower | Laterality: Right

## 2021-12-22 MED ORDER — AMISULPRIDE (ANTIEMETIC) 5 MG/2ML IV SOLN: 10.0000 mg | Freq: Once | INTRAVENOUS | Status: DC | PRN

## 2021-12-22 MED ORDER — LACTATED RINGERS IV SOLN: INTRAVENOUS | Status: DC | PRN

## 2021-12-22 MED ORDER — PROMETHAZINE HCL 25 MG/ML IJ SOLN: 6.2500 mg | INTRAMUSCULAR | Status: DC | PRN

## 2021-12-22 MED ORDER — MIDAZOLAM HCL 2 MG/2ML IJ SOLN
INTRAMUSCULAR | Status: AC
  Filled 2021-12-22: qty 2

## 2021-12-22 MED ORDER — EPINEPHRINE PF 1 MG/ML IJ SOLN
INTRAMUSCULAR | Status: AC
  Filled 2021-12-22: qty 1

## 2021-12-22 MED ORDER — DEXAMETHASONE SODIUM PHOSPHATE 10 MG/ML IJ SOLN
INTRAMUSCULAR | Status: DC | PRN
  Administered 2021-12-22: 8 mg via INTRAVENOUS

## 2021-12-22 MED ORDER — LIDOCAINE HCL (PF) 2 % IJ SOLN
INTRAMUSCULAR | Status: AC
  Filled 2021-12-22: qty 5

## 2021-12-22 MED ORDER — PROPOFOL 10 MG/ML IV BOLUS
INTRAVENOUS | Status: AC
  Filled 2021-12-22: qty 20

## 2021-12-22 MED ORDER — FENTANYL CITRATE PF 50 MCG/ML IJ SOSY
25.0000 ug | PREFILLED_SYRINGE | Freq: Once | INTRAMUSCULAR | Status: AC
  Administered 2021-12-22: 50 ug via INTRAVENOUS

## 2021-12-22 MED ORDER — ONDANSETRON HCL 4 MG/2ML IJ SOLN
INTRAMUSCULAR | Status: DC | PRN
Start: 2021-12-22 — End: 2021-12-22
  Administered 2021-12-22: 4 mg via INTRAVENOUS

## 2021-12-22 MED ORDER — LACTATED RINGERS IV SOLN: INTRAVENOUS | Status: DC

## 2021-12-22 MED ORDER — MINERAL OIL LIGHT OIL
TOPICAL_OIL | Status: AC
  Filled 2021-12-22: qty 20

## 2021-12-22 MED ORDER — CELECOXIB 200 MG PO CAPS: 200.0000 mg | ORAL_CAPSULE | Freq: Once | ORAL | Status: DC

## 2021-12-22 MED ORDER — ACETAMINOPHEN 500 MG PO TABS: 1000.0000 mg | ORAL_TABLET | Freq: Once | ORAL | Status: DC

## 2021-12-22 MED ORDER — EPINEPHRINE PF 1 MG/ML IJ SOLN
INTRAMUSCULAR | Status: DC | PRN
  Administered 2021-12-22: 3 mg

## 2021-12-22 MED ORDER — MIDAZOLAM HCL 2 MG/2ML IJ SOLN
INTRAMUSCULAR | Status: DC | PRN
Start: 2021-12-22 — End: 2021-12-22
  Administered 2021-12-22: 2 mg via INTRAVENOUS

## 2021-12-22 MED ORDER — SODIUM CHLORIDE 0.9 % IR SOLN
Status: DC | PRN
  Administered 2021-12-22: 3000 mL

## 2021-12-22 MED ORDER — TISSEEL VH 10 ML EX KIT
PACK | CUTANEOUS | Status: AC
  Filled 2021-12-22: qty 1

## 2021-12-22 MED ORDER — TISSEEL VH 10 ML EX KIT
PACK | CUTANEOUS | Status: DC | PRN
  Administered 2021-12-22: 10 mL via TOPICAL

## 2021-12-22 MED ORDER — FENTANYL CITRATE (PF) 100 MCG/2ML IJ SOLN
INTRAMUSCULAR | Status: AC
  Filled 2021-12-22: qty 2

## 2021-12-22 MED ORDER — KETAMINE HCL 10 MG/ML IJ SOLN
INTRAMUSCULAR | Status: AC
  Filled 2021-12-22: qty 1

## 2021-12-22 MED ORDER — THROMBIN (RECOMBINANT) 5000 UNITS EX SOLR
CUTANEOUS | Status: AC
  Filled 2021-12-22: qty 5000

## 2021-12-22 MED ORDER — FENTANYL CITRATE PF 50 MCG/ML IJ SOSY: 25.0000 ug | PREFILLED_SYRINGE | INTRAMUSCULAR | Status: DC | PRN

## 2021-12-22 MED ORDER — FENTANYL CITRATE (PF) 100 MCG/2ML IJ SOLN
INTRAMUSCULAR | Status: DC | PRN
  Administered 2021-12-22 (×3): 50 ug via INTRAVENOUS

## 2021-12-22 MED ORDER — LIDOCAINE HCL (CARDIAC) PF 100 MG/5ML IV SOSY
PREFILLED_SYRINGE | INTRAVENOUS | Status: DC | PRN
Start: 2021-12-22 — End: 2021-12-22
  Administered 2021-12-22: 100 mg via INTRAVENOUS

## 2021-12-22 MED ORDER — PROPOFOL 10 MG/ML IV BOLUS
INTRAVENOUS | Status: DC | PRN
Start: 2021-12-22 — End: 2021-12-22
  Administered 2021-12-22: 20 mg via INTRAVENOUS
  Administered 2021-12-22: 180 mg via INTRAVENOUS

## 2021-12-22 MED ORDER — KETAMINE HCL 10 MG/ML IJ SOLN
INTRAMUSCULAR | Status: DC | PRN
Start: 2021-12-22 — End: 2021-12-22
  Administered 2021-12-22: 30 mg via INTRAVENOUS

## 2021-12-22 MED ORDER — FENTANYL CITRATE PF 50 MCG/ML IJ SOSY
PREFILLED_SYRINGE | INTRAMUSCULAR | Status: AC
  Filled 2021-12-22: qty 1

## 2021-12-22 MED ORDER — BUPIVACAINE-EPINEPHRINE (PF) 0.25% -1:200000 IJ SOLN
INTRAMUSCULAR | Status: AC
  Filled 2021-12-22: qty 30

## 2021-12-22 MED ORDER — FENTANYL CITRATE (PF) 250 MCG/5ML IJ SOLN
INTRAMUSCULAR | Status: AC
  Filled 2021-12-22: qty 5

## 2021-12-22 SURGICAL SUPPLY — 70 items
BAG COUNTER SPONGE SURGICOUNT (BAG) IMPLANT
BAG ZIPLOCK 12X15 (MISCELLANEOUS) ×2 IMPLANT
BLADE CLIPPER SURG (BLADE) ×2 IMPLANT
BLADE DERMATOME SS (BLADE) ×2 IMPLANT
BNDG ELASTIC 4X5.8 VLCR STR LF (GAUZE/BANDAGES/DRESSINGS) ×3 IMPLANT
BNDG GAUZE ELAST 4 BULKY (GAUZE/BANDAGES/DRESSINGS) IMPLANT
CANISTER WOUND CARE 500ML ATS (WOUND CARE) ×1 IMPLANT
CNTNR URN SCR LID CUP LEK RST (MISCELLANEOUS) ×1 IMPLANT
CONT SPEC 4OZ STRL OR WHT (MISCELLANEOUS)
COVER SURGICAL LIGHT HANDLE (MISCELLANEOUS) ×2 IMPLANT
DECANTER SPIKE VIAL GLASS SM (MISCELLANEOUS) IMPLANT
DEPRESSOR TONGUE BLADE STERILE (MISCELLANEOUS) ×4 IMPLANT
DERMACARRIERS GRAFT 1 TO 1.5 (DISPOSABLE) ×2
DRAPE EXTREMITY T 121X128X90 (DISPOSABLE) ×1 IMPLANT
DRAPE INCISE IOBAN 66X45 STRL (DRAPES) ×5 IMPLANT
DRAPE LAPAROTOMY T 102X78X121 (DRAPES) ×1 IMPLANT
DRAPE TOP 10253 STERILE (DRAPES) ×1 IMPLANT
DRAPE UTILITY XL STRL (DRAPES) ×1 IMPLANT
DRSG ADAPTIC 3X8 NADH LF (GAUZE/BANDAGES/DRESSINGS) ×2 IMPLANT
DRSG HYDROCOLLOID 4X4 (GAUZE/BANDAGES/DRESSINGS) ×2 IMPLANT
DRSG MEPILEX SACRM 8.7X9.8 (GAUZE/BANDAGES/DRESSINGS) ×1 IMPLANT
DRSG PAD ABDOMINAL 8X10 ST (GAUZE/BANDAGES/DRESSINGS) IMPLANT
DRSG TELFA 3X8 NADH (GAUZE/BANDAGES/DRESSINGS) ×4 IMPLANT
DRSG VAC ATS LRG SENSATRAC (GAUZE/BANDAGES/DRESSINGS) ×1 IMPLANT
ELECT REM PT RETURN 15FT ADLT (MISCELLANEOUS) ×2 IMPLANT
GAUZE SPONGE 4X4 12PLY STRL (GAUZE/BANDAGES/DRESSINGS) ×5 IMPLANT
GAUZE XEROFORM 5X9 LF (GAUZE/BANDAGES/DRESSINGS) IMPLANT
GLOVE SURG ENC MOIS LTX SZ6.5 (GLOVE) ×2 IMPLANT
GOWN STRL REUS W/TWL LRG LVL3 (GOWN DISPOSABLE) ×4 IMPLANT
GRAFT DERMACARRIERS 1 TO 1.5 (DISPOSABLE) ×1 IMPLANT
GRAFT MYRIAD 3 LAYER 10X20 (Graft) ×1 IMPLANT
HANDPIECE INTERPULSE COAX TIP (DISPOSABLE)
KIT BASIN OR (CUSTOM PROCEDURE TRAY) ×2 IMPLANT
KIT TURNOVER KIT A (KITS) IMPLANT
MANIFOLD NEPTUNE II (INSTRUMENTS) ×2 IMPLANT
MARKER SKIN DUAL TIP RULER LAB (MISCELLANEOUS) ×1 IMPLANT
NDL SAFETY ECLIPSE 18X1.5 (NEEDLE) IMPLANT
NEEDLE HYPO 18GX1.5 SHARP (NEEDLE) ×6
NEEDLE HYPO 22GX1.5 SAFETY (NEEDLE) ×2 IMPLANT
NS IRRIG 1000ML POUR BTL (IV SOLUTION) ×2 IMPLANT
PACK GENERAL/GYN (CUSTOM PROCEDURE TRAY) ×2 IMPLANT
PACK ORTHO EXTREMITY (CUSTOM PROCEDURE TRAY) ×2 IMPLANT
PAD CAST 4YDX4 CTTN HI CHSV (CAST SUPPLIES) ×1 IMPLANT
PAD DRESSING TELFA 3X8 NADH (GAUZE/BANDAGES/DRESSINGS) IMPLANT
PADDING CAST COTTON 4X4 STRL (CAST SUPPLIES) ×2
PENCIL SMOKE EVACUATOR (MISCELLANEOUS) IMPLANT
POWDER MYRIAD MORCELLS 1000MG (Miscellaneous) ×1 IMPLANT
SET HNDPC FAN SPRY TIP SCT (DISPOSABLE) IMPLANT
SET IRRIG Y TYPE TUR BLADDER L (SET/KITS/TRAYS/PACK) ×1 IMPLANT
SLEEVE SCD COMPRESS KNEE MED (STOCKING) ×1 IMPLANT
SOL PREP POV-IOD 4OZ 10% (MISCELLANEOUS) ×2 IMPLANT
SOL SCRUB PVP POV-IOD 4OZ 7.5% (MISCELLANEOUS) ×2
SOLUTION SCRB POV-IOD 4OZ 7.5% (MISCELLANEOUS) ×1 IMPLANT
SPONGE T-LAP 18X18 ~~LOC~~+RFID (SPONGE) ×2 IMPLANT
STAPLER VISISTAT 35W (STAPLE) ×1 IMPLANT
SURGILUBE 2OZ TUBE FLIPTOP (MISCELLANEOUS) ×1 IMPLANT
SUT MNCRL AB 4-0 PS2 18 (SUTURE) ×1 IMPLANT
SUT PDS AB 5-0 PS2 (SUTURE) ×2 IMPLANT
SUT SILK 4 0 PS 2 (SUTURE) IMPLANT
SUT VIC AB 2-0 SH 18 (SUTURE) IMPLANT
SUT VIC AB 3-0 SH 27 (SUTURE) ×8
SUT VIC AB 3-0 SH 27X BRD (SUTURE) IMPLANT
SUT VIC AB 5-0 PS2 18 (SUTURE) ×2 IMPLANT
SWAB COLLECTION DEVICE MRSA (MISCELLANEOUS) IMPLANT
SWAB CULTURE ESWAB REG 1ML (MISCELLANEOUS) ×1 IMPLANT
SYR 3ML LL SCALE MARK (SYRINGE) ×1 IMPLANT
SYR CONTROL 10ML LL (SYRINGE) ×2 IMPLANT
TOWEL OR 17X26 10 PK STRL BLUE (TOWEL DISPOSABLE) ×2 IMPLANT
UNDERPAD 30X36 HEAVY ABSORB (UNDERPADS AND DIAPERS) ×2 IMPLANT
WATER STERILE IRR 1000ML POUR (IV SOLUTION) ×2 IMPLANT

## 2021-12-22 NOTE — Progress Notes (Signed)
PROGRESS NOTE    Jason Buchanan  ZOX:[id number] DOB: 1986-07-08 DOA: 11/14/2021 PCP: [name], NP     Brief Narrative:   36 year old male with medical history significant for polysubstance abuse including intravenous drug abuse presented to hospital with bilateral arm pain from injection to arms bilaterally.  CT scan of the upper extremity showed extensive cellulitis and was initially started on broad-spectrum antibiotic.  After hospitalization, patient has undergone multiple bilateral upper extremity I&D's by hand surgery and plastic surgery.  Blood culture was also positive for beta Streptococcus.  On 11/07/2021, patient developed melena with active GI bleed requiring PRBC transfusion.  GI was consulted and patient underwent EGD confirming mild proximal mild esophagitis with nonbleeding ulcers, nonbleeding duodenal ulcers which were injected.  Post procedure, he developed respiratory failure requiring intubation and mechanical ventilation and was subsequently extubated on 11/17/2021.  Patient was subsequently considered stable for transfer out of the ICU and is currently in the hospitalist service.  Patient has been having multiple surgical intervention during hospitalization.  Assessment & Plan:   Sepsis due to cellulitis/continued pain.   Currently, on IV daptomycin.  Plan to transition to Zyvox on discharge but patient has been in the hospital for long time on IV daptomycin.. Will check with ID for further antibiotic plan.  Communicated with Dr. Linus Salmons about it.  Currently on Dilaudid,Tylenol oxycodone and gabapentin.  Patient continues to require IV Dilaudid for adequate control of pain.  Patient has had several I&D's during hospitalization and underwent debridement in preparation for skin graft with placement of Myriad to the right  arm open wound and wound VAC by plastic surgery on 12/14/2021.  Plastic surgery planning for further operative intervention on 12/22/2021.             Polysubstance abuse  History of IV drug abuse.  Patient wishes to undergo drug treatment program after discharge.     Hypokalemia Latest potassium of 4.2  Mild hypomagnesemia.  Continue magnesium oxide.  Latest magnesium of 1.6     Streptococcal bacteremia ID has seen the patient, patient had received antibiotic.  No plan for TEE.     Right arm cellulitis Status post I&D and surgery by plastic, orthopedic and hand surgery.  Further operative intervention as per plastic surgery.    Staphylococcus aureus infection Currently on daptomycin. Will check wtih ID regarding need for further antibiotic.  Monitor CBC, closely including CK levels.    Acute blood loss anemia due to upper GI bleed. Hold Toradol and NSAIDs.  Continue to monitor symptoms.   Latest hemoglobin of 10.2  Postoperative respiratory failure. Resolved at this time.     Amphetamine and psychostimulant-induced psychosis with hallucinations  Continue Seroquel and trazodone.  Patient has had amphetamine use in the hospital and was counseled about it.  Tachycardia has improved at this time.     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  Difficult IV access.   Patient is not a good candidate for midline or PICC line placement due to extremity infection.  Will likely need central line for IV medications and antibiotic since he is likely to stay in the hospital for few more days but currently IV line is functional..   DVT prophylaxis: SCDs  Code Status: Full code  Family Communication:  Spoke with the patient at bedside  Disposition Plan: Home when medically stable and okay with surgery . Has been denied by [facility]  Barriers to Discharge: Medical improvement   Consultants:  Hand surgery Plastic surgery Orthopedic surgery PCCM Wound Care Psychiatry GI   Procedures:  12/08/21- I&D and debridement of LUE and L  hand 12/07/21- Wound vac change of RUE, I&D of deep abscess of  R upper arm 12/03/21- I&D 12/01/21- I&D Incision and drainage and debridement right upper extremity/incision and drainage of left upper extremity by Dr. Lenon Curt hand surgeon 11/23/2021. Wound evaluation left upper extremity with dressing change under sedation/incision and drainage right upper extremity and loose closure of wound, right upper extremity per Dr. Lenon Curt, hand surgeon 11/18/2021 Right hand fasciotomy/right hand carpal tunnel release/right hand renal scan no release/right forearm 3 compartment fasciotomy/thorough irrigation of hand and forearm/dorsal hand incision and drainage including extensor tendon sheath per Dr. Lenon Curt, hand surgeon 11/17/2021 Central line insertion per PCCM, Dr. Tamala Julian 11/17/2021 Upper endoscopy per GI, Dr. Watt Climes 11/17/2021 Left wrist deep abscess incision and drainage/left volar forearm fasciotomy/left wrist carpal tunnel release extended with Guyon's canal release--per Dr. Greta Doom orthopedics 11/15/2021  Antimicrobials:  Daptomycin IV  Subjective: Today, patient was seen and examined at bedside.  Complains of mild pain in the arm but otherwise okay.  Awaiting for surgical intervention again today.   Objective: Vitals:   12/21/21 0413 12/21/21 1436 12/21/21 2002 12/22/21 0527  BP: 123/70 (!) 152/81 (!) 152/75 (!) 143/68  Pulse: 94 99 95 99  Resp: '20 18 17 18  ' Temp: (!) 97.5 F (36.4 C) 97.6 F (36.4 C) (!) 97.5 F (36.4 C) (!) 97.5 F (36.4 C)  TempSrc: Oral Oral  Oral  SpO2: 97% 99% 96% 97%  Weight:      Height:        Intake/Output Summary (Last 24 hours) at 12/22/2021 1034 Last data filed at 12/22/2021 0300 Gross per 24 hour  Intake 960 ml  Output 1800 ml  Net -840 ml    Filed Weights   11/14/21 0835 11/15/21 1608 11/25/21 1342  Weight: 60 kg 60 kg 60 kg    Physical examination:  General:  Average built, not in obvious distress, appears a little anxious and fidgety. HENT:   No  scleral pallor or icterus noted. Oral mucosa is moist.  Chest:  Clear breath sounds.  Diminished breath sounds bilaterally. No crackles or wheezes.  CVS: S1 &S2 heard. No murmur.  Regular rate and rhythm. Abdomen: Soft, nontender, nondistended.  Bowel sounds are heard.   Extremities: Bilateral upper extremity ACE bandage.  Distal capillary refill present. Psych: Alert, awake and oriented, little anxious and fidgety. CNS:  No cranial nerve deficits.  Power equal in all extremities.   Skin: Warm and dry.  Bilateral upper extremity on his bandage.  Data Reviewed: I have personally reviewed following labs and imaging studies  CBC: Recent Labs  Lab 12/16/21 0451 12/17/21 0356 12/20/21 0720  WBC 18.3* 14.0* 9.3  HGB 9.8* 9.7* 10.2*  HCT 32.2* 31.7* 32.4*  MCV 88.5 87.1 84.8  PLT 543* 462* 867    Basic Metabolic Panel: Recent Labs  Lab 12/16/21 0451 12/20/21 0720  NA 137 133*  K 3.6 4.2  CL 104 102  CO2 24 24  GLUCOSE 102* 119*  BUN 17 14  CREATININE 0.34* 0.48*  CALCIUM 8.5* 8.6*  MG  --  1.6*    GFR: Estimated Creatinine Clearance: 109.4 mL/min (A) (by C-G formula based on SCr of 0.48 mg/dL (L)).  Liver Function Tests: No results for input(s): AST, ALT, ALKPHOS, BILITOT, PROT, ALBUMIN in the last 168 hours.  Cardiac Enzymes: Recent Labs  Lab 12/16/21 0451  CKTOTAL 30*    Scheduled Meds:  (feeding supplement) PROSource Plus  30 mL Oral TID BM   acetaminophen  500 mg Oral Q6H   vitamin C  500 mg Oral Daily   Chlorhexidine Gluconate Cloth  6 each Topical Daily   feeding supplement  237 mL Oral TID BM   ferrous sulfate  325 mg Oral Q breakfast   gabapentin  400 mg Oral TID   magnesium oxide  400 mg Oral BID   nicotine  21 mg Transdermal Daily   pantoprazole  40 mg Oral BID   polyethylene glycol  17 g Oral BID   QUEtiapine  100 mg Oral BID   Continuous Infusions:  DAPTOmycin (CUBICIN)  IV 500 mg (12/21/21 2009)     LOS: 38 days    Flora Lipps,  MD [facility] 12/22/2021, 10:34 AM

## 2021-12-22 NOTE — Interval H&P Note (Signed)
History and Physical Interval Note:  12/22/2021 2:00 PM  Jason Buchanan  has presented today for surgery, with the diagnosis of Right arm wound.  The various methods of treatment have been discussed with the patient and family. After consideration of risks, benefits and other options for treatment, the patient has consented to  Procedure(s) with comments: IRRIGATION AND DEBRIDEMENT ARM  WOUND (Right) - 1 hour WOUND VAC CHANGE (Right) SKIN GRAFT FULL THICKNESS (Right) SKIN GRAFT SPLIT THICKNESS (Right) APPLICATION OF SKIN SUBSTITUTE (Right) as a surgical intervention.  The patient's history has been reviewed, patient examined, no change in status, stable for surgery.  I have reviewed the patient's chart and labs.  Questions were answered to the patient's satisfaction.     [name]

## 2021-12-22 NOTE — Anesthesia Procedure Notes (Signed)
Procedure Name: LMA Insertion Date/Time: 12/22/2021 2:13 PM Performed by: Raenette Rover, CRNA Pre-anesthesia Checklist: Patient identified, Emergency Drugs available, Suction available and Patient being monitored Patient Re-evaluated:Patient Re-evaluated prior to induction Oxygen Delivery Method: Circle system utilized Preoxygenation: Pre-oxygenation with 100% oxygen Induction Type: IV induction Ventilation: Mask ventilation without difficulty LMA: LMA inserted LMA Size: 4.0 Number of attempts: 1 Placement Confirmation: positive ETCO2 and breath sounds checked- equal and bilateral Tube secured with: Tape Dental Injury: Teeth and Oropharynx as per pre-operative assessment

## 2021-12-22 NOTE — Transfer of Care (Signed)
Immediate Anesthesia Transfer of Care Note  Patient: Jason Buchanan  Procedure(s) Performed: IRRIGATION AND DEBRIDEMENT ARM  WOUND (Right: Arm Lower) WOUND VAC CHANGE (Right: Arm Lower) APPLICATION OF SKIN SUBSTITUTE (Right: Arm Lower)  Patient Location: PACU  Anesthesia Type:General  Level of Consciousness: drowsy  Airway & Oxygen Therapy: Patient Spontanous Breathing and Patient connected to face mask oxygen  Post-op Assessment: Report given to RN and Post -op Vital signs reviewed and stable  Post vital signs: Reviewed and stable  Last Vitals:  Vitals Value Taken Time  BP 136/62 12/22/21 1533  Temp    Pulse 104 12/22/21 1535  Resp 12 12/22/21 1535  SpO2 96 % 12/22/21 1535  Vitals shown include unvalidated device data.  Last Pain:  Vitals:   12/22/21 1329  TempSrc:   PainSc: 8       Patients Stated Pain Goal: 4 (12/22/21 1042)  Complications: No notable events documented.

## 2021-12-22 NOTE — Anesthesia Preprocedure Evaluation (Addendum)
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 with prolonged (>30 min) block after GI procedure)  Airway Mallampati: II  TM Distance: >3 FB Neck ROM: Full    Dental  (+) Poor Dentition, Dental Advisory Given   Pulmonary asthma , Current Smoker and Patient abstained from smoking.,    Pulmonary exam normal        Cardiovascular negative cardio ROS Normal cardiovascular exam     Neuro/Psych negative neurological ROS     GI/Hepatic negative GI ROS, (+)     substance abuse  cocaine use, methamphetamine use and IV drug use,   Endo/Other  negative endocrine ROS  Renal/GU negative Renal ROS  negative genitourinary   Musculoskeletal negative musculoskeletal ROS (+)   Abdominal   Peds  Hematology  (+) anemia ,   Anesthesia Other Findings  Presented with sepsis and BUE cellulitis s/p I&D  Reproductive/Obstetrics                            Anesthesia Physical  Anesthesia Plan  ASA: 3  Anesthesia Plan: General   Post-op Pain Management: Ketamine IV, Celebrex PO (pre-op) and Tylenol PO (pre-op)   Induction: Intravenous  PONV Risk Score and Plan: 1 and Ondansetron, Dexamethasone, Midazolam and Treatment may vary due to age or medical condition  Airway Management Planned: LMA  Additional Equipment: None  Intra-op Plan:   Post-operative Plan: Extubation in OR  Informed Consent: I have reviewed the patients History and Physical, chart, labs and discussed the procedure including the risks, benefits and alternatives for the proposed anesthesia with the patient or authorized representative who has  indicated his/her understanding and acceptance.     Dental advisory given  Plan Discussed with: Anesthesiologist and CRNA  Anesthesia Plan Comments:        Anesthesia Quick Evaluation

## 2021-12-22 NOTE — Progress Notes (Signed)
Patient is currently in the OR so will reassess him tomorrow.   Thayer Headings, MD

## 2021-12-22 NOTE — Anesthesia Postprocedure Evaluation (Signed)
Anesthesia Post Note  Patient: VERDEN DONINI  Procedure(s) Performed: IRRIGATION AND DEBRIDEMENT ARM  WOUND (Right: Arm Lower) WOUND VAC CHANGE (Right: Arm Lower) APPLICATION OF SKIN SUBSTITUTE (Right: Arm Lower)     Patient location during evaluation: PACU Anesthesia Type: General Level of consciousness: sedated Pain management: pain level controlled Vital Signs Assessment: post-procedure vital signs reviewed and stable Respiratory status: spontaneous breathing and respiratory function stable Cardiovascular status: stable Postop Assessment: no apparent nausea or vomiting Anesthetic complications: no   No notable events documented.  Last Vitals:  Vitals:   12/22/21 1545 12/22/21 1600  BP: 130/64 130/71  Pulse: 98 100  Resp: 12 13  Temp:    SpO2: 98% 99%    Last Pain:  Vitals:   12/22/21 1600  TempSrc:   PainSc: Asleep                 Rylee Huestis DANIEL

## 2021-12-22 NOTE — Op Note (Signed)
Operative Note   DATE OF OPERATION: 12/22/2021  SURGICAL DEPARTMENT: Plastic Surgery  PREOPERATIVE DIAGNOSES:  open wound right arm  POSTOPERATIVE DIAGNOSES:  same  PROCEDURE:   1)  Debridement with preparation for skin graft right arm 21x6 cm2 (126 cm2) and 8x2 (16 cm2) right upper arm 2) Placement of Myriad Matrix 21x6 (126 cm2) and Myriad Morcells 1036m 3)  Placement of wound vac 142 cm2  SURGEON: Caydence Koenig P. Pinchos Topel, MD  ASSISTANT: None  ANESTHESIA:  General.   COMPLICATIONS: None.   INDICATIONS FOR PROCEDURE:  The patient, MJamol Ginyardis a 36y.o. male born on 409/30/1987 is here for treatment of right arm wound MRN: [id number] CONSENT:  Informed consent was obtained directly from the patient. Risks, benefits and alternatives were fully discussed. Specific risks including but not limited to bleeding, infection, hematoma, seroma, scarring, pain, contracture, asymmetry, wound healing problems, and need for further surgery were all discussed. The patient did have an ample opportunity to have questions answered to satisfaction.   DESCRIPTION OF PROCEDURE:  The patient was taken to the operating room. SCDs were placed.  General anesthesia was administered.  The patient's operative site was prepped and draped in a sterile fashion. A time out was performed and all information was confirmed to be correct.    The right arm then irrigated with copious saline.  Debridement was performed with a 10 blade as well as scissors.  Small areas of tendon were debrided leaving the majority of tendons intact.  A curette was used to debride the muscle by removing a thin layer of granulation tissue.  Additional irrigation was performed.  Hemostasis was performed using epi Telfa followed by Bovie electrocautery.  After confirming hemostasis myriad was placed.  The myriad was 21 x 6 cm and was sutured in place with Vicryl sutures.  Myriad morsels were then placed under the matrix.  Following this Mepitel  was sutured in place over the myriad.  VAC sponge was cut to fit the wounds and VAC dressing was applied over this.  Satisfactory seal was achieved.  The arm was wrapped with cast padding followed by Ace wrap.    The patient tolerated the procedure well.  There were no complications. The patient was allowed to wake from anesthesia, extubated and taken to the recovery room in satisfactory condition.

## 2021-12-23 ENCOUNTER — Encounter (HOSPITAL_COMMUNITY): Payer: Self-pay | Admitting: Plastic Surgery

## 2021-12-23 DIAGNOSIS — D62 Acute posthemorrhagic anemia: Secondary | ICD-10-CM | POA: Diagnosis not present

## 2021-12-23 DIAGNOSIS — B182 Chronic viral hepatitis C: Secondary | ICD-10-CM | POA: Diagnosis not present

## 2021-12-23 DIAGNOSIS — E878 Other disorders of electrolyte and fluid balance, not elsewhere classified: Secondary | ICD-10-CM | POA: Diagnosis not present

## 2021-12-23 DIAGNOSIS — L089 Local infection of the skin and subcutaneous tissue, unspecified: Secondary | ICD-10-CM | POA: Diagnosis not present

## 2021-12-23 DIAGNOSIS — F1910 Other psychoactive substance abuse, uncomplicated: Secondary | ICD-10-CM | POA: Diagnosis not present

## 2021-12-23 DIAGNOSIS — L0390 Cellulitis, unspecified: Secondary | ICD-10-CM | POA: Diagnosis not present

## 2021-12-23 DIAGNOSIS — A419 Sepsis, unspecified organism: Secondary | ICD-10-CM | POA: Diagnosis not present

## 2021-12-23 LAB — CBC
HCT: 36 % — ABNORMAL LOW (ref 39.0–52.0)
Hemoglobin: 10.9 g/dL — ABNORMAL LOW (ref 13.0–17.0)
MCH: 26.5 pg (ref 26.0–34.0)
MCHC: 30.3 g/dL (ref 30.0–36.0)
MCV: 87.4 fL (ref 80.0–100.0)
Platelets: 483 10*3/uL — ABNORMAL HIGH (ref 150–400)
RBC: 4.12 MIL/uL — ABNORMAL LOW (ref 4.22–5.81)
RDW: 14.5 % (ref 11.5–15.5)
WBC: 13.1 10*3/uL — ABNORMAL HIGH (ref 4.0–10.5)
nRBC: 0 % (ref 0.0–0.2)

## 2021-12-23 LAB — BASIC METABOLIC PANEL
Anion gap: 9 (ref 5–15)
BUN: 15 mg/dL (ref 6–20)
CO2: 23 mmol/L (ref 22–32)
Calcium: 8.6 mg/dL — ABNORMAL LOW (ref 8.9–10.3)
Chloride: 103 mmol/L (ref 98–111)
Creatinine, Ser: 0.49 mg/dL — ABNORMAL LOW (ref 0.61–1.24)
GFR, Estimated: >60 mL/min (ref >60)
Glucose, Bld: 136 mg/dL — ABNORMAL HIGH (ref 70–99)
Potassium: 3.7 mmol/L (ref 3.5–5.1)
Sodium: 135 mmol/L (ref 135–145)

## 2021-12-23 LAB — MAGNESIUM: Magnesium: 2 mg/dL (ref 1.7–2.4)

## 2021-12-23 LAB — CK: Total CK: 27 U/L — ABNORMAL LOW (ref 49–397)

## 2021-12-23 NOTE — Progress Notes (Signed)
PROGRESS NOTE    Jason Buchanan  [id number] DOB: 10-29-1986 DOA: 11/14/2021 PCP: [name], NP    Brief Narrative:  Jason Buchanan was admitted to the hospital with the working diagnosis of severe sepsis due to right upper extremities cellulitis, complicated with streptococcus bacteremia.    36 yo male with the past medical history of polysubstance abuse who presented with bilateral arm pain. Patient injecting multiple drugs in both arms. On his initial physical examination his blood pressure was 107/88, HR 113, RR 17, oxygen saturation 100%, lungs clear to auscultation, heart S1 and S2 present and rhythmic, abdomen soft, right upper extremity with edema and erythema, bullous lesions. Left hand with erythema.    Sodium 130, potassium 3.0, chloride 91, bicarb 23, glucose 75, BUN 45, creatinine 1.19, lactic acid 3.5, white count 27.6, hemoglobin 13.9, hematocrit 41.9, platelets 321. SARS COVID-19 negative.   Urine analysis specific gravity 1.018. Toxicology screen positive for amphetamines.   Chest radiograph no infiltrates.   EKG 113 bpm, normal axis, normal intervals, sinus rhythm with poor R wave progression, positive LVH, no significant ST segment or T wave changes.   CT scan of upper extremities showed extensive cellulitis. He was placed on broad-spectrum antibiotic therapy.   12/13 I&D of bilateral upper extremities. Blood culture positive for Streptococcus group B.   12/15 patient developed melena with acute blood loss, hemoglobin down to 5.8.  Patient received 2 units packed red blood cell transfusion and was placed on intravenous pantoprazole. 12/15 upper endoscopy showing mild proximal mid esophagitis.  Nonbleeding gastric ulcers with no stigmata of bleeding.  Positive adherent clot.  Nonbleeding duodenal ulcers with adherent clot.  Ulcers were injected.   He had postprocedure respiratory failure that required invasive mechanical ventilation.   12/15, wound evaluation  of the left upper extremity with dressing change under sedation.  Incision and drainage of right upper extremity and loose closure of wound, right upper extremity.   Extubated late 12/15.   Tolerating well antibiotic therapy and dressing changes.  May requires further I&D depending on character of drainage and most likely require further skin debridement on the right.    12/20 Incision and drainage and debridement of right upper extremity, incision and drainage of left upper extremity.    Not able to place PICC line because bilateral upper extremity infections, high risk for line infection IV team not able to get a peripheral IV.    12/23 bilateral arms I&D, the OR under anesthesia.   01/04 wound vac change of the right upper extremity, I&D of deep abscess of the right upper arm.   01/11 Debridement with preparation of skin graft 21x8 cm and 8x2 right arm,  Application of wound vac dressing right arm.  01/19 debridement with preparation of skin graft right arm 21x6 and 8,2 right upper arm. Placement of wound vac   Assessment & Plan:   Principal Problem:   Sepsis due to cellulitis [facility]) Active Problems:   Polysubstance abuse ([place])   Hypokalemia   IVDU (intravenous drug user)   Streptococcal bacteremia   Right arm cellulitis   Staphylococcus aureus infection   Acute blood loss anemia   Upper GI bleed   Respiratory failure, post-operative (HCC)   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HCC)   Hepatitis C 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   Protein-calorie malnutrition, severe   Bacterial skin infection of upper extremity   Electrolyte abnormality   Encounter for central line care   Bilateral upper extremities cellulitis, in the  setting of IV drug abuse. Streptococcus pyogenes bacteremia. Left hand wound culture positive for staph aureus.  Sp I&D bilateral upper extremities.  Follow up blood cultures with no growth on 12/14.    Patient had debridement,  noted necrotic muscle with pus. Plan to continue with daptomycin IV during his hospitalization and change to oral linezold for 30 days more.   Antibiotic therapy with daptomycin IV while hospitalized and will change to oral linezolid for 30 more days at the time of his discharge Patient has been responding well to surgical interventions. His wbc is 13,1 and his repeat blood cultures resulted negative.    Continue pain control with  IV hydromorphone, oral oxycodone,  acetaminophen and as needed cyclobenzaprine   Ok to follow up cell count in 48 hrs.    2. Upper GI bleed with acute blood loss anemia, gastric and duodenal ulcers.  On pantoprazole and tolerating po well.     3. Hyponatremia, hypokalemia and hypomagnesemia. Severe protein calorie malnutrition.  Stable renal function and electrolytes, continue to encourage po intake. Follow renal function in 48 hrs    4. Polysubstance abuse and depression/ tobacco abuse  On 100 mg bid of seroquel and trazodone with good toleration.  Follow up with psychiatry as outpatient    Nicotine patch.  No clinical signs of withdrawal.    5. Post operative respiratory failure. Clinically resolved, patient with less than 24 hrs on mechanical ventilation    6. Anemia, likely blood loss from extensive upper extremities wounds. Thrombocytosis Iron deficiency anemia.  Sp 1 unit PRBC transfusion.  Serum iron is 13, TIBC 268, transferrin saturation 5 and ferritin of 58.  Holding on IV iron for now due to current systemic infection.    On oral iron supplementation  Follow up hgb is 10,9 and hct at 36,0  Reactive thrombocytosis at 483   Patient continue to be at high risk for worsening wound infection   Status is: Inpatient  Remains inpatient appropriate because: IV antibiotic therapy and surgical interventions      DVT prophylaxis:  Enoxaparin   Code Status:    full  Family Communication:   No family at the bedside      Nutrition  Status: Nutrition Problem: Severe Malnutrition Etiology: acute illness, wound healing Signs/Symptoms: severe fat depletion, severe muscle depletion Interventions: Prostat, MVI, Ensure Enlive (each supplement provides 350kcal and 20 grams of protein)     Consultants:  Plastic surgery  ID   Procedures:  See above   Antimicrobials:  Daptomycin     Subjective: Patient with improvement in edema and mobility in upper extremities, his pain is controlled with analgesics, no nausea or vomiting and tolerating po well.   Objective: Vitals:   12/22/21 1630 12/22/21 1821 12/22/21 2137 12/23/21 0427  BP: 136/80 (!) 149/68 (!) 143/92 (!) 144/68  Pulse: 97 (!) 107 (!) 104 100  Resp: 13 20 20 20   Temp: 98.1 F (36.7 C) 98.7 F (37.1 C) 97.8 F (36.6 C) 98.1 F (36.7 C)  TempSrc:    Oral  SpO2: 94% 98% 100% 100%  Weight:      Height:        Intake/Output Summary (Last 24 hours) at 12/23/2021 1507 Last data filed at 12/23/2021 1403 Gross per 24 hour  Intake 523 ml  Output 3085 ml  Net -2562 ml   Filed Weights   11/14/21 0835 11/15/21 1608 11/25/21 1342  Weight: 60 kg 60 kg 60 kg    Examination:  General:  deconditioned  Neurology: Awake and alert, non focal  E ENT: no pallor, no icterus, oral mucosa  Cardiovascular: heart with S1 and S2 present and rhythmic Pulmonary: lungs clear to auscultation with no wheezing  Gastrointestinal. Abdomen soft and non tender Skin. Bilateral upper extremities with dressing in place, right with wound vac in place.  Musculoskeletal: no joint deformities     Data Reviewed: I have personally reviewed following labs and imaging studies  CBC: Recent Labs  Lab 12/17/21 0356 12/20/21 0720 12/23/21 0434  WBC 14.0* 9.3 13.1*  HGB 9.7* 10.2* 10.9*  HCT 31.7* 32.4* 36.0*  MCV 87.1 84.8 87.4  PLT 462* 368 [id number]*   Basic Metabolic Panel: Recent Labs  Lab 12/20/21 0720 12/23/21 0434  NA 133* 135  K 4.2 3.7  CL 102 103  CO2 24 23   GLUCOSE 119* 136*  BUN 14 15  CREATININE 0.48* 0.49*  CALCIUM 8.6* 8.6*  MG 1.6* 2.0   GFR: Estimated Creatinine Clearance: 109.4 mL/min (A) (by C-G formula based on SCr of 0.49 mg/dL (L)). Liver Function Tests: No results for input(s): AST, ALT, ALKPHOS, BILITOT, PROT, ALBUMIN in the last 168 hours. No results for input(s): LIPASE, AMYLASE in the last 168 hours. No results for input(s): AMMONIA in the last 168 hours. Coagulation Profile: No results for input(s): INR, PROTIME in the last 168 hours. Cardiac Enzymes: Recent Labs  Lab 12/23/21 0434  CKTOTAL 27*   BNP (last 3 results) No results for input(s): PROBNP in the last 8760 hours. HbA1C: No results for input(s): HGBA1C in the last 72 hours. CBG: No results for input(s): GLUCAP in the last 168 hours. Lipid Profile: No results for input(s): CHOL, HDL, LDLCALC, TRIG, CHOLHDL, LDLDIRECT in the last 72 hours. Thyroid Function Tests: No results for input(s): TSH, T4TOTAL, FREET4, T3FREE, THYROIDAB in the last 72 hours. Anemia Panel: No results for input(s): VITAMINB12, FOLATE, FERRITIN, TIBC, IRON, RETICCTPCT in the last 72 hours.    Radiology Studies: I have reviewed all of the imaging during this hospital visit personally     Scheduled Meds:  (feeding supplement) PROSource Plus  30 mL Oral TID BM   acetaminophen  500 mg Oral Q6H   vitamin C  500 mg Oral Daily   Chlorhexidine Gluconate Cloth  6 each Topical Daily   feeding supplement  237 mL Oral TID BM   ferrous sulfate  325 mg Oral Q breakfast   gabapentin  400 mg Oral TID   magnesium oxide  400 mg Oral BID   nicotine  21 mg Transdermal Daily   pantoprazole  40 mg Oral BID   polyethylene glycol  17 g Oral BID   QUEtiapine  100 mg Oral BID   Continuous Infusions:  DAPTOmycin (CUBICIN)  IV 500 mg (12/22/21 2200)   lactated ringers 20 mL/hr at 12/22/21 1251     LOS: 39 days        Laquita Harlan Gerome Apley, MD

## 2021-12-23 NOTE — Progress Notes (Signed)
°    [facility]   Reason for visit: Follow up on cellulitis with abscess  Interval History: s/p operative debridement in preparation of skin grafting.  WBC 13.1.  remains afebrile. Main complaint of pain.  Day 40 total antibiotics  Physical Exam: Constitutional:  Vitals:   12/23/21 0427 12/23/21 1512  BP: (!) 144/68 (!) 161/91  Pulse: 100 (!) 103  Resp: 20 20  Temp: 98.1 F (36.7 C) 98 F (36.7 C)  SpO2: 100% 99%   patient appears in NAD Respiratory: Normal respiratory effort; CTA B Cardiovascular: RRR MS: both arms wrapped  Review of Systems: Constitutional: negative for fevers and chills Gastrointestinal: negative for nausea and diarrhea  Lab Results  Component Value Date   WBC 13.1 (H) 12/23/2021   HGB 10.9 (L) 12/23/2021   HCT 36.0 (L) 12/23/2021   MCV 87.4 12/23/2021   PLT 483 (H) 12/23/2021    Lab Results  Component Value Date   CREATININE 0.49 (L) 12/23/2021   BUN 15 12/23/2021   NA 135 12/23/2021   K 3.7 12/23/2021   CL 103 12/23/2021   CO2 23 12/23/2021    Lab Results  Component Value Date   ALT 19 12/07/2021   AST 18 12/07/2021   ALKPHOS 89 12/07/2021     Microbiology: No results found for this or any previous visit (from the past 240 hour(s)).  Impression/Plan:  1. Cellulitis and abscess - previous growth during debridement in December with Strep pneumonia, MRSA, and mainly Strep pyogenes.  Has been on daptomycin now for a prolonged period.  Surgery yesterday with no necrotic tissue. Previous ooperative report without necrosis or abscess on 12/14/21.   At this point, he has completed a prolonged course of antibiotics for the infection and will stop his antibiotics.    2.  Wound - continued wound due to the extensive infection and followed by  Dr. Domenica Reamer.  Possible need for a skin graft.     3.  IV drug use - discussed need to remain drug free and he discussed his plans with me of a program in [place].  Amphetamine and  benzodiazepine positive on recent drug screen.    4.  Chronic hepatitis C - positive viral load, genotype 3.  He can follow up in our clinic after discharge for hepatitis C management after his rehab stay.    I will sign off, call with questions.

## 2021-12-23 NOTE — Progress Notes (Signed)
Notified by Hassell Halim manager that housekeeper Kimmie alerted her that pt showed her a handful of pills. Entered room and alerted patient of reported pills. Pt denied. Leadership searched patient room for pills and unable to locate. Did find cutdown straw in pocket of gown. Pt stated was not for snorting, but used as a q-tip with tissue. Straw deposed of in trash outside of room. Re-educated patient that recreational drug use is not allowed and must be prevented to keep patient safe.  Pt verbalized understanding.  MD alerted and request for tele sitter camera use.

## 2021-12-24 DIAGNOSIS — A419 Sepsis, unspecified organism: Secondary | ICD-10-CM | POA: Diagnosis not present

## 2021-12-24 DIAGNOSIS — L0390 Cellulitis, unspecified: Secondary | ICD-10-CM | POA: Diagnosis not present

## 2021-12-24 LAB — CBC
HCT: 33.3 % — ABNORMAL LOW (ref 39.0–52.0)
Hemoglobin: 10.1 g/dL — ABNORMAL LOW (ref 13.0–17.0)
MCH: 26.7 pg (ref 26.0–34.0)
MCHC: 30.3 g/dL (ref 30.0–36.0)
MCV: 88.1 fL (ref 80.0–100.0)
Platelets: 419 10*3/uL — ABNORMAL HIGH (ref 150–400)
RBC: 3.78 MIL/uL — ABNORMAL LOW (ref 4.22–5.81)
RDW: 14.8 % (ref 11.5–15.5)
WBC: 10.4 10*3/uL (ref 4.0–10.5)
nRBC: 0 % (ref 0.0–0.2)

## 2021-12-24 NOTE — Progress Notes (Addendum)
PROGRESS NOTE    Jason Buchanan  XBJ:[id number] DOB: 06/04/1986 DOA: 11/14/2021 PCP: Lavinia SharpsPlacey, Mary Ann, NP   Brief Narrative:  36 year old male with medical history significant for polysubstance abuse including intravenous drug abuse presented to hospital with bilateral arm pain from injection to arms bilaterally.  CT scan of the upper extremity showed extensive cellulitis and was initially started on broad-spectrum antibiotic.  After hospitalization, patient has undergone multiple bilateral upper extremity I&D's by hand surgery and plastic surgery.  Blood culture was also positive for beta Streptococcus.   Patient has been having multiple surgical intervention during hospitalization.  Assessment & Plan:  Bilateral upper extremities cellulitis, in the setting of IV drug abuse.  Streptococcus pyogenes bacteremia.  Left hand wound culture positive for staph aureus.  Prolonged hospitalization with multiple procedures/I&D's -12/13 I&D bilateral upper extremities  -12/15: Wound evaluation of left upper extremity with the dressing change under sedation.  Incision and drainage of right upper extremity and loose closure of wound, right upper extremity.   -12/20 and 12/23: Status post incision and drainage of right and left upper extremity -01/04 wound vac change of the right upper extremity, I&D of deep abscess of the right upper arm.  -1/11 Debridement with preparation of skin graft 21x8 cm and 8x2 right arm, Application of wound vac dressing right arm.  -01/19 debridement with preparation of skin graft right arm 21x6 and 8,2 right upper arm. Placement of wound vac  -He has completed prolonged course of antibiotic for the infection-ID decided to discontinue his antibiotics.  Appreciate IDs help. -He remained afebrile.  Leukocytosis resolved.-Continue as needed pain medications of IV Dilaudid, oxycodone and Tylenol as needed -Plan is: he will need to go to the OR some time next week to have his VAC  changed and subsequently skin graft -once his granulation tissue growth over his tendons.    Upper GI bleed with acute blood loss anemia -Patient developed melena with acute blood loss.   -Hemoglobin trended down to 5.8%.  He received 2 unit PRBC.   -Upper GI endoscopy was performed on 12/15 which shows mild esophagitis and nonbleeding gastric ulcer.  Positive [name] and clot.  Ulcers were injected. -On pantoprazole and tolerating po well.    -No further episodes of bleeding per rectum.  H&H 10.1/33.3 -Monitor H&H closely and transfuse as needed   Polysubstance abuse and depression/ tobacco abuse  -On 100 mg bid of seroquel and trazodone with good toleration.  -Follow up with psychiatry as outpatient    Post operative respiratory failure:  -Required intubation..  Stable now.  On room air  Reactive thrombocytosis : 419  Hepatitis C 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: -Follow-up with ID outpatient  Protein calorie malnutrition: Severe -Continue nutritional support  Hyponatremia Hypokalemia Hypomagnesemia: All resolved    DVT prophylaxis: SCD Code Status: Full code Family Communication:  None present at bedside.  Plan of care discussed with patient in length and he verbalized understanding and agreed with it. Disposition Plan: home  Consultants:  Hand surgery Plastic surgery Orthopedic surgery PCCM Wound Care Psychiatry GI  Procedures:  12/08/21- I&D and debridement of LUE and L hand 12/07/21- Wound vac change of RUE, I&D of deep abscess of  R upper arm 12/03/21- I&D 12/01/21- I&D Incision and drainage and debridement right upper extremity/incision and drainage of left upper extremity by Dr. Izora Ribasoley hand surgeon 11/23/2021. Wound evaluation left upper extremity with dressing change under sedation/incision and drainage right upper extremity and loose closure of wound, right upper extremity per  Dr. Izora Ribas, hand surgeon 11/18/2021 Right hand fasciotomy/right hand carpal tunnel release/right  hand renal scan no release/right forearm 3 compartment fasciotomy/thorough irrigation of hand and forearm/dorsal hand incision and drainage including extensor tendon sheath per Dr. Izora Ribas, hand surgeon 11/17/2021 Central line insertion per PCCM, Dr. Katrinka Blazing 11/17/2021 Upper endoscopy per GI, Dr. Ewing Schlein 11/17/2021 Left wrist deep abscess incision and drainage/left volar forearm fasciotomy/left wrist carpal tunnel release extended with Guyon's canal release--per Dr. Yehuda Budd orthopedics 11/15/2021    Antimicrobials:  IV daptomycin  Status is: Inpatient    Subjective: Patient seen and examined.  Sitting comfortably on the bed eating breakfast.  Reports that he is in severe pain in his hands and requested to go up on Dilaudid dose from every 3 hours to every 2 hours scheduled.  No fever.  No acute events overnight He tells me that he is scheduled for procedure?  Next week with plastic surgery.  Objective: Vitals:   12/23/21 0427 12/23/21 1512 12/23/21 2052 12/24/21 0432  BP: (!) 144/68 (!) 161/91 (!) 143/83 129/75  Pulse: 100 (!) 103 (!) 102 92  Resp: 20 20 20 20   Temp: 98.1 F (36.7 C) 98 F (36.7 C) 98.4 F (36.9 C) 98.1 F (36.7 C)  TempSrc: Oral Oral Oral Oral  SpO2: 100% 99% 98% 99%  Weight:      Height:        Intake/Output Summary (Last 24 hours) at 12/24/2021 1219 Last data filed at 12/24/2021 0930 Gross per 24 hour  Intake 237 ml  Output 1450 ml  Net -1213 ml   Filed Weights   11/14/21 0835 11/15/21 1608 11/25/21 1342  Weight: 60 kg 60 kg 60 kg    Examination:  General exam: In pain, anxious  respiratory system: Clear to auscultation. Respiratory effort normal. Cardiovascular system: S1 & S2 heard, RRR. No JVD, murmurs, rubs, gallops or clicks. No pedal edema. Gastrointestinal system: Abdomen is nondistended, soft and nontender. No organomegaly or masses felt. Normal bowel sounds heard. Central nervous system: Alert and oriented. No focal neurological  deficits. Extremities: Ace bandage noted in bilateral upper extremities.  Able to wiggle his fingers.   Psychiatry: Judgement and insight appear normal.  Anxious due to pain   Data Reviewed: I have personally reviewed following labs and imaging studies  CBC: Recent Labs  Lab 12/20/21 0720 12/23/21 0434 12/24/21 0802  WBC 9.3 13.1* 10.4  HGB 10.2* 10.9* 10.1*  HCT 32.4* 36.0* 33.3*  MCV 84.8 87.4 88.1  PLT 368 483* 419*   Basic Metabolic Panel: Recent Labs  Lab 12/20/21 0720 12/23/21 0434  NA 133* 135  K 4.2 3.7  CL 102 103  CO2 24 23  GLUCOSE 119* 136*  BUN 14 15  CREATININE 0.48* 0.49*  CALCIUM 8.6* 8.6*  MG 1.6* 2.0   GFR: Estimated Creatinine Clearance: 109.4 mL/min (A) (by C-G formula based on SCr of 0.49 mg/dL (L)). Liver Function Tests: No results for input(s): AST, ALT, ALKPHOS, BILITOT, PROT, ALBUMIN in the last 168 hours. No results for input(s): LIPASE, AMYLASE in the last 168 hours. No results for input(s): AMMONIA in the last 168 hours. Coagulation Profile: No results for input(s): INR, PROTIME in the last 168 hours. Cardiac Enzymes: Recent Labs  Lab 12/23/21 0434  CKTOTAL 27*   BNP (last 3 results) No results for input(s): PROBNP in the last 8760 hours. HbA1C: No results for input(s): HGBA1C in the last 72 hours. CBG: No results for input(s): GLUCAP in the last 168 hours. Lipid Profile:  No results for input(s): CHOL, HDL, LDLCALC, TRIG, CHOLHDL, LDLDIRECT in the last 72 hours. Thyroid Function Tests: No results for input(s): TSH, T4TOTAL, FREET4, T3FREE, THYROIDAB in the last 72 hours. Anemia Panel: No results for input(s): VITAMINB12, FOLATE, FERRITIN, TIBC, IRON, RETICCTPCT in the last 72 hours. Sepsis Labs: No results for input(s): PROCALCITON, LATICACIDVEN in the last 168 hours.  No results found for this or any previous visit (from the past 240 hour(s)).    Radiology Studies: No results found.  Scheduled Meds:  (feeding  supplement) PROSource Plus  30 mL Oral TID BM   acetaminophen  500 mg Oral Q6H   vitamin C  500 mg Oral Daily   Chlorhexidine Gluconate Cloth  6 each Topical Daily   feeding supplement  237 mL Oral TID BM   ferrous sulfate  325 mg Oral Q breakfast   gabapentin  400 mg Oral TID   magnesium oxide  400 mg Oral BID   nicotine  21 mg Transdermal Daily   pantoprazole  40 mg Oral BID   polyethylene glycol  17 g Oral BID   QUEtiapine  100 mg Oral BID   Continuous Infusions:  lactated ringers 20 mL/hr at 12/22/21 1251     LOS: 40 days   Time spent: 35 minutes   Madissen Wyse Estill Cotta, MD [facility]  If 7PM-7AM, please contact night-coverage [email or web address] 12/24/2021, 12:19 PM

## 2021-12-25 DIAGNOSIS — A419 Sepsis, unspecified organism: Secondary | ICD-10-CM | POA: Diagnosis not present

## 2021-12-25 DIAGNOSIS — L0390 Cellulitis, unspecified: Secondary | ICD-10-CM | POA: Diagnosis not present

## 2021-12-25 MED ORDER — OXYCODONE HCL 5 MG PO TABS
15.0000 mg | ORAL_TABLET | ORAL | Status: DC | PRN
  Administered 2021-12-25 – 2021-12-27 (×10): 15 mg via ORAL
  Filled 2021-12-25 (×11): qty 3

## 2021-12-25 NOTE — Progress Notes (Signed)
PROGRESS NOTE    Jason Buchanan  NOM:[id number] DOB: 19-Sep-1986 DOA: 11/14/2021 PCP: Lavinia Sharps, NP   Brief Narrative:  36 year old male with medical history significant for polysubstance abuse including intravenous drug abuse presented to hospital with bilateral arm pain from injection to arms bilaterally.  CT scan of the upper extremity showed extensive cellulitis and was initially started on broad-spectrum antibiotic.  After hospitalization, patient has undergone multiple bilateral upper extremity I&D's by hand surgery and plastic surgery.  Blood culture was also positive for beta Streptococcus.  On 11/07/2021, patient developed melena with active GI bleed requiring PRBC transfusion.  GI was consulted and patient underwent EGD confirming mild proximal mild esophagitis with nonbleeding ulcers, nonbleeding duodenal ulcers which were injected.  Post procedure, he developed respiratory failure requiring intubation and mechanical ventilation and was subsequently extubated on 11/17/2021.  Patient was subsequently considered stable for transfer out of the ICU and is currently in the hospitalist service.  Patient has been having multiple surgical intervention during hospitalization.  -12/13 I&D bilateral upper extremities  -12/15: Wound evaluation of left upper extremity with the dressing change under sedation.  Incision and drainage of right upper extremity and loose closure of wound, right upper extremity.   -12/20 and 12/23: Status post incision and drainage of right and left upper extremity -01/04 wound vac change of the right upper extremity, I&D of deep abscess of the right upper arm.  -1/11 Debridement with preparation of skin graft 21x8 cm and 8x2 right arm, Application of wound vac dressing right arm.  -01/19 debridement with preparation of skin graft right arm 21x6 and 8,2 right upper arm. Placement of wound vac   Assessment & Plan:  Bilateral upper extremities cellulitis: in the  setting of IV drug abuse.  Streptococcus pyogenes bacteremia.  Left hand wound culture positive for staph aureus.  Prolonged hospitalization with multiple procedures/I&D's: -He has completed prolonged course of antibiotic for the infection-ID decided to discontinue his antibiotics.  Appreciate IDs help. -He remained afebrile.  Leukocytosis resolved. -Continue as needed pain medications of IV Dilaudid 2 mg every 3 hours scheduled, oxycodone 15 mg every 3 hour as needed and Tylenol as needed.   -Continue muscle relaxant and gabapentin -Discussed with plastic surgery Dr. Domenica Reamer on 1/21: Plan is: he will need to go to the OR some time next week to have his VAC changed and subsequently skin graft -once his granulation tissue growth over his tendons.    Upper GI bleed with acute blood loss anemia -Patient developed melena with acute blood loss.   -Hemoglobin trended down to 5.8%.  He received 2 unit PRBC.   -Upper GI endoscopy was performed on 12/15 which shows mild esophagitis and nonbleeding gastric ulcer.  Positive [name] and clot.  Ulcers were injected. -Continue PPI twice daily.  Avoid NSAIDs.  -No further episodes of bleeding per rectum.  H&H 10.1/33.3 -Continue ferrous sulfate -Monitor H&H closely and transfuse as needed   Polysubstance abuse and depression/ tobacco abuse  -On 100 mg bid of seroquel and trazodone with good toleration.  -Continue nicotine patch -Follow up with psychiatry as outpatient    Post operative respiratory failure:  -Required intubation..  Stable now.  On room air  Reactive thrombocytosis : 419  Hepatitis C 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: -Follow-up with ID outpatient  Protein calorie malnutrition: Severe -Continue nutritional support  Hyponatremia Hypokalemia Hypomagnesemia: All resolved    DVT prophylaxis: SCD Code Status: Full code Family Communication:  None present at bedside.  Plan of  care discussed with patient in length and he verbalized  understanding and agreed with it. Disposition Plan: home  Consultants:  Hand surgery Plastic surgery Orthopedic surgery PCCM Wound Care Psychiatry GI  Procedures:  12/08/21- I&D and debridement of LUE and L hand 12/07/21- Wound vac change of RUE, I&D of deep abscess of  R upper arm 12/03/21- I&D 12/01/21- I&D Incision and drainage and debridement right upper extremity/incision and drainage of left upper extremity by Dr. Izora Ribas hand surgeon 11/23/2021. Wound evaluation left upper extremity with dressing change under sedation/incision and drainage right upper extremity and loose closure of wound, right upper extremity per Dr. Izora Ribas, hand surgeon 11/18/2021 Right hand fasciotomy/right hand carpal tunnel release/right hand renal scan no release/right forearm 3 compartment fasciotomy/thorough irrigation of hand and forearm/dorsal hand incision and drainage including extensor tendon sheath per Dr. Izora Ribas, hand surgeon 11/17/2021 Central line insertion per PCCM, Dr. Katrinka Blazing 11/17/2021 Upper endoscopy per GI, Dr. Ewing Schlein 11/17/2021 Left wrist deep abscess incision and drainage/left volar forearm fasciotomy/left wrist carpal tunnel release extended with Guyon's canal release--per Dr. Yehuda Budd orthopedics 11/15/2021  12/20 and 12/23: Status post incision and drainage of right and left upper extremity 01/04 wound vac change of the right upper extremity, I&D of deep abscess of the right upper arm.  1/11 Debridement with preparation of skin graft 21x8 cm and 8x2 right arm, Application of wound vac dressing right arm.  01/19 debridement with preparation of skin graft right arm 21x6 and 8,2 right upper arm. Placement of wound vac   Antimicrobials:  None  Status is: Inpatient    Subjective: Seen and examined.  Complaining of pain in his hands and requesting again to go up on the Dilaudid frequency from every 3 hour to every 2 hours.  Remained afebrile.  No acute events overnight.   Objective: Vitals:    12/24/21 2140 12/24/21 2200 12/24/21 2300 12/25/21 0520  BP:    123/74  Pulse:    94  Resp: 17 18 16 20   Temp:    97.8 F (36.6 C)  TempSrc:    Oral  SpO2:    99%  Weight:      Height:        Intake/Output Summary (Last 24 hours) at 12/25/2021 1205 Last data filed at 12/25/2021 1121 Gross per 24 hour  Intake 2310.93 ml  Output 3400 ml  Net -1089.07 ml    Filed Weights   11/14/21 0835 11/15/21 1608 11/25/21 1342  Weight: 60 kg 60 kg 60 kg    Examination:  General exam: In pain, anxious, deconditioned respiratory system: Clear to auscultation. Respiratory effort normal. Cardiovascular system: S1 & S2 heard, RRR. No JVD, murmurs, rubs, gallops or clicks. No pedal edema. Gastrointestinal system: Abdomen is nondistended, soft and nontender. No organomegaly or masses felt. Normal bowel sounds heard. Central nervous system: Alert and oriented. No focal neurological deficits. Extremities: Ace bandage noted in bilateral upper extremities.  Able to wiggle his fingers and move his arms Psychiatry: Judgement and insight appear normal.  Anxious due to pain   Data Reviewed: I have personally reviewed following labs and imaging studies  CBC: Recent Labs  Lab 12/20/21 0720 12/23/21 0434 12/24/21 0802  WBC 9.3 13.1* 10.4  HGB 10.2* 10.9* 10.1*  HCT 32.4* 36.0* 33.3*  MCV 84.8 87.4 88.1  PLT 368 483* 419*    Basic Metabolic Panel: Recent Labs  Lab 12/20/21 0720 12/23/21 0434  NA 133* 135  K 4.2 3.7  CL 102 103  CO2 24 23  GLUCOSE 119* 136*  BUN 14 15  CREATININE 0.48* 0.49*  CALCIUM 8.6* 8.6*  MG 1.6* 2.0    GFR: Estimated Creatinine Clearance: 109.4 mL/min (A) (by C-G formula based on SCr of 0.49 mg/dL (L)). Liver Function Tests: No results for input(s): AST, ALT, ALKPHOS, BILITOT, PROT, ALBUMIN in the last 168 hours. No results for input(s): LIPASE, AMYLASE in the last 168 hours. No results for input(s): AMMONIA in the last 168 hours. Coagulation Profile: No  results for input(s): INR, PROTIME in the last 168 hours. Cardiac Enzymes: Recent Labs  Lab 12/23/21 0434  CKTOTAL 27*    BNP (last 3 results) No results for input(s): PROBNP in the last 8760 hours. HbA1C: No results for input(s): HGBA1C in the last 72 hours. CBG: No results for input(s): GLUCAP in the last 168 hours. Lipid Profile: No results for input(s): CHOL, HDL, LDLCALC, TRIG, CHOLHDL, LDLDIRECT in the last 72 hours. Thyroid Function Tests: No results for input(s): TSH, T4TOTAL, FREET4, T3FREE, THYROIDAB in the last 72 hours. Anemia Panel: No results for input(s): VITAMINB12, FOLATE, FERRITIN, TIBC, IRON, RETICCTPCT in the last 72 hours. Sepsis Labs: No results for input(s): PROCALCITON, LATICACIDVEN in the last 168 hours.  No results found for this or any previous visit (from the past 240 hour(s)).    Radiology Studies: No results found.  Scheduled Meds:  (feeding supplement) PROSource Plus  30 mL Oral TID BM   acetaminophen  500 mg Oral Q6H   vitamin C  500 mg Oral Daily   Chlorhexidine Gluconate Cloth  6 each Topical Daily   feeding supplement  237 mL Oral TID BM   ferrous sulfate  325 mg Oral Q breakfast   gabapentin  400 mg Oral TID   magnesium oxide  400 mg Oral BID   nicotine  21 mg Transdermal Daily   pantoprazole  40 mg Oral BID   polyethylene glycol  17 g Oral BID   QUEtiapine  100 mg Oral BID   Continuous Infusions:  lactated ringers 20 mL/hr at 12/25/21 0734     LOS: 41 days   Time spent: 35 minutes   Neya Creegan Estill Cotta Samay Delcarlo, MD [facility]  If 7PM-7AM, please contact night-coverage [email or web address] 12/25/2021, 12:05 PM

## 2021-12-26 DIAGNOSIS — F15951 Other stimulant use, unspecified with stimulant-induced psychotic disorder with hallucinations: Secondary | ICD-10-CM | POA: Diagnosis not present

## 2021-12-26 DIAGNOSIS — D62 Acute posthemorrhagic anemia: Secondary | ICD-10-CM | POA: Diagnosis not present

## 2021-12-26 DIAGNOSIS — L03113 Cellulitis of right upper limb: Secondary | ICD-10-CM | POA: Diagnosis not present

## 2021-12-26 DIAGNOSIS — L0390 Cellulitis, unspecified: Secondary | ICD-10-CM | POA: Diagnosis not present

## 2021-12-26 LAB — BASIC METABOLIC PANEL
Anion gap: 9 (ref 5–15)
BUN: 19 mg/dL (ref 6–20)
CO2: 24 mmol/L (ref 22–32)
Calcium: 9 mg/dL (ref 8.9–10.3)
Chloride: 101 mmol/L (ref 98–111)
Creatinine, Ser: 0.44 mg/dL — ABNORMAL LOW (ref 0.61–1.24)
GFR, Estimated: >60 mL/min (ref >60)
Glucose, Bld: 122 mg/dL — ABNORMAL HIGH (ref 70–99)
Potassium: 3.9 mmol/L (ref 3.5–5.1)
Sodium: 134 mmol/L — ABNORMAL LOW (ref 135–145)

## 2021-12-26 LAB — CBC
HCT: 37.8 % — ABNORMAL LOW (ref 39.0–52.0)
Hemoglobin: 11.4 g/dL — ABNORMAL LOW (ref 13.0–17.0)
MCH: 26.3 pg (ref 26.0–34.0)
MCHC: 30.2 g/dL (ref 30.0–36.0)
MCV: 87.3 fL (ref 80.0–100.0)
Platelets: 431 10*3/uL — ABNORMAL HIGH (ref 150–400)
RBC: 4.33 MIL/uL (ref 4.22–5.81)
RDW: 14.6 % (ref 11.5–15.5)
WBC: 10 10*3/uL (ref 4.0–10.5)
nRBC: 0 % (ref 0.0–0.2)

## 2021-12-26 NOTE — Progress Notes (Signed)
PROGRESS NOTE    Jason Buchanan  [id number] DOB: [birth year] DOA: 11/14/2021 PCP: [name], NP   Brief Narrative:  36 year old male with medical history significant for polysubstance abuse including intravenous drug abuse presented to hospital with bilateral arm pain from injection to arms bilaterally.  CT scan of the upper extremity showed extensive cellulitis and was initially started on broad-spectrum antibiotic.  After hospitalization, patient has undergone multiple bilateral upper extremity I&D's by hand surgery and plastic surgery.  Blood culture was also positive for beta Streptococcus.  On 11/07/2021, patient developed melena with active GI bleed requiring PRBC transfusion.  GI was consulted and patient underwent EGD confirming mild proximal mild esophagitis with nonbleeding ulcers, nonbleeding duodenal ulcers which were injected.  Post procedure, he developed respiratory failure requiring intubation and mechanical ventilation and was subsequently extubated on 11/17/2021.  Patient was subsequently considered stable for transfer out of the ICU and is currently in the hospitalist service.  Patient has been having multiple surgical intervention during hospitalization.  Assessment & Plan:  Sepsis due to bilateral upper extremity cellulitis/streptococcal bacteremia Patient has completed course of antibiotic as per ID.  Was on daptomycin for a prolonged duration. Currently on Dilaudid,Tylenol oxycodone and gabapentin for pain management.  Patient is afebrile no leukocytosis..  Patient continues to require IV Dilaudid for adequate control of pain.  Patient has had several I&D's during hospitalization.  Plastic surgery planning for further operative intervention including wound VAC change and subsequent skin graft.            Polysubstance abuse  History of IV drug abuse.  Patient wishes to undergo drug treatment program after discharge.     Acute blood loss anemia due to upper GI  bleed. Hold Toradol and NSAIDs.  Continue to monitor symptoms.   Latest hemoglobin of 11.4.  EGD done on 11/17/2021 showed mild esophagitis and nonbleeding gastric ulcer.  Status post 2 units of packed RBCs.  On iron sulfate.  Hemoglobin has remained stable.   Postoperative respiratory failure. Resolved at this time.  Initially required intubation.  Reactive thrombocytosis.  Continue to monitor.     Amphetamine and psychostimulant-induced psychosis with hallucinations  Continue Seroquel and trazodone.       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   Disposition Plan: home  DVT prophylaxis: SCD  Code Status: Full code  Family Communication:   Spoke with the patient.  Consultants:  Hand surgery Plastic surgery Orthopedic surgery PCCM Wound Care Psychiatry GI  Procedures:  12/08/21- I&D and debridement of LUE and L hand 12/07/21- Wound vac change of RUE, I&D of deep abscess of  R upper arm 12/03/21- I&D 12/01/21- I&D Incision and drainage and debridement right upper extremity/incision and drainage of left upper extremity by Dr. Lenon Curt hand surgeon 11/23/2021. Wound evaluation left upper extremity with dressing change under sedation/incision and drainage right upper extremity and loose closure of wound, right upper extremity per Dr. Lenon Curt, hand surgeon 11/18/2021 Right hand fasciotomy/right hand carpal tunnel release/right hand renal scan no release/right forearm 3 compartment fasciotomy/thorough irrigation of hand and forearm/dorsal hand incision and drainage including extensor tendon sheath per Dr. Lenon Curt, hand surgeon 11/17/2021 Central line insertion per PCCM, Dr. Tamala Julian 11/17/2021 Upper endoscopy per GI, Dr. Watt Climes 11/17/2021 Left wrist deep abscess incision and drainage/left volar forearm fasciotomy/left wrist carpal tunnel release extended with Guyon's canal release--per  Dr. Greta Doom orthopedics 11/15/2021  12/20 and 12/23: Status post  incision and drainage of right and left upper extremity 01/04 wound vac change of the right upper extremity, I&D of deep abscess of the right upper arm.  1/11 Debridement with preparation of skin graft 21x8 cm and 8x2 right arm, Application of wound vac dressing right arm.  01/19 debridement with preparation of skin graft right arm 21x6 and 8,2 right upper arm. Placement of wound vac   Antimicrobials:  None    Subjective: Today, patient was seen and examined at bedside.  Complains of mild pain on the arm.  Denies any nausea vomiting fever or chills.   Objective: Vitals:   12/25/21 1350 12/25/21 2049 12/25/21 2130 12/26/21 0601  BP: 134/79 (!) 136/101 (!) 171/81 (!) 147/71  Pulse: 95 100 (!) 102 94  Resp: 19 20  20   Temp: 97.9 F (36.6 C) 97.8 F (36.6 C)  97.7 F (36.5 C)  TempSrc: Oral Oral  Oral  SpO2: 98% 100%  98%  Weight:      Height:        Intake/Output Summary (Last 24 hours) at 12/26/2021 0720 Last data filed at 12/26/2021 0651 Gross per 24 hour  Intake 466.17 ml  Output 3050 ml  Net -2583.83 ml    Filed Weights   11/14/21 0835 11/15/21 1608 11/25/21 1342  Weight: 60 kg 60 kg 60 kg   Physical examination  General:  Average built, not in obvious distress, deconditioned. HENT:   No scleral pallor or icterus noted. Oral mucosa is moist.  Chest:  Clear breath sounds.  Diminished breath sounds bilaterally. No crackles or wheezes.  CVS: S1 &S2 heard. No murmur.  Regular rate and rhythm. Abdomen: Soft, nontender, nondistended.  Bowel sounds are heard.   Extremities: Bilateral upper extremity on ACE bandage Psych: Alert, awake and oriented, normal mood CNS:  No cranial nerve deficits.  Power equal in all extremities.   Skin: Warm and dry.  No rashes noted.   Data Reviewed: I have personally reviewed following labs and imaging studies  CBC: Recent Labs  Lab 12/20/21 0720 12/23/21 0434  12/24/21 0802 12/26/21 0506  WBC 9.3 13.1* 10.4 10.0  HGB 10.2* 10.9* 10.1* 11.4*  HCT 32.4* 36.0* 33.3* 37.8*  MCV 84.8 87.4 88.1 87.3  PLT 368 483* 419* 431*    Basic Metabolic Panel: Recent Labs  Lab 12/20/21 0720 12/23/21 0434 12/26/21 0506  NA 133* 135 134*  K 4.2 3.7 3.9  CL 102 103 101  CO2 24 23 24   GLUCOSE 119* 136* 122*  BUN 14 15 19   CREATININE 0.48* 0.49* 0.44*  CALCIUM 8.6* 8.6* 9.0  MG 1.6* 2.0  --     GFR: Estimated Creatinine Clearance: 109.4 mL/min (A) (by C-G formula based on SCr of 0.44 mg/dL (L)). Liver Function Tests: No results for input(s): AST, ALT, ALKPHOS, BILITOT, PROT, ALBUMIN in the last 168 hours. No results for input(s): LIPASE, AMYLASE in the last 168 hours. No results for input(s): AMMONIA in the last 168 hours. Coagulation Profile: No results for input(s): INR, PROTIME in the last 168 hours. Cardiac Enzymes: Recent Labs  Lab 12/23/21 0434  CKTOTAL 27*    BNP (last 3 results) No results for input(s): PROBNP in the last 8760 hours. HbA1C: No results for input(s): HGBA1C in the last 72 hours. CBG: No results for input(s): GLUCAP in the last 168 hours. Lipid Profile: No results for input(s): CHOL, HDL, LDLCALC, TRIG, CHOLHDL, LDLDIRECT in the last 72 hours. Thyroid Function Tests: No results for input(s): TSH, T4TOTAL,  FREET4, T3FREE, THYROIDAB in the last 72 hours. Anemia Panel: No results for input(s): VITAMINB12, FOLATE, FERRITIN, TIBC, IRON, RETICCTPCT in the last 72 hours. Sepsis Labs: No results for input(s): PROCALCITON, LATICACIDVEN in the last 168 hours.  No results found for this or any previous visit (from the past 240 hour(s)).    Radiology Studies: No results found.  Scheduled Meds:  (feeding supplement) PROSource Plus  30 mL Oral TID BM   acetaminophen  500 mg Oral Q6H   vitamin C  500 mg Oral Daily   Chlorhexidine Gluconate Cloth  6 each Topical Daily   feeding supplement  237 mL Oral TID BM   ferrous  sulfate  325 mg Oral Q breakfast   gabapentin  400 mg Oral TID   magnesium oxide  400 mg Oral BID   nicotine  21 mg Transdermal Daily   pantoprazole  40 mg Oral BID   polyethylene glycol  17 g Oral BID   QUEtiapine  100 mg Oral BID   Continuous Infusions:  lactated ringers 20 mL/hr at 12/25/21 1552     LOS: 42 days   Flora Lipps, MD [facility] If 7PM-7AM, please contact night-coverage [email or web address] 12/26/2021, 7:20 AM

## 2021-12-26 NOTE — TOC Progression Note (Addendum)
Transition of Care [facility]) - Progression Note    Patient Details  Name: JAMAHL LEMMONS MRN: [id number] Date of Birth: 1986/06/07  [facility] of [facility]) CM/SW Contact  Darleene Cleaver, [place] Phone Number: 12/26/2021, 7:05 PM  Clinical Narrative:    CSW continuing to follow patient's progress throughout discharge planning, on the DTP list.  Still no bed offers.   Expected Discharge Plan:  (currently unknown) Barriers to Discharge: Continued Medical Work up  Expected Discharge Plan and Services Expected Discharge Plan:  (currently unknown)   Discharge Planning Services: CM Consult   Living arrangements for the past 2 months: Homeless Shelter Expected Discharge Date: 11/14/21                                     Social Determinants of Health (SDOH) Interventions    Readmission Risk Interventions No flowsheet data found.

## 2021-12-27 DIAGNOSIS — L0390 Cellulitis, unspecified: Secondary | ICD-10-CM | POA: Diagnosis not present

## 2021-12-27 DIAGNOSIS — L03113 Cellulitis of right upper limb: Secondary | ICD-10-CM | POA: Diagnosis not present

## 2021-12-27 DIAGNOSIS — D62 Acute posthemorrhagic anemia: Secondary | ICD-10-CM | POA: Diagnosis not present

## 2021-12-27 DIAGNOSIS — F15951 Other stimulant use, unspecified with stimulant-induced psychotic disorder with hallucinations: Secondary | ICD-10-CM | POA: Diagnosis not present

## 2021-12-27 MED ORDER — OXYCODONE HCL 5 MG/5ML PO SOLN
15.0000 mg | ORAL | Status: DC | PRN
  Administered 2021-12-27 – 2022-01-17 (×97): 15 mg via ORAL
  Filled 2021-12-27 (×103): qty 15

## 2021-12-27 NOTE — Progress Notes (Signed)
Walked in to check on patient. Patient noted to have a white powdery substance on bedside table and holding finger up to nose and making snorting sound. This LPN questioned patient and patient refused comment. Night shift RN made aware and will report to oncoming provider.

## 2021-12-27 NOTE — Progress Notes (Signed)
Dayshift LPN reported off to this RN that patient was found to have "white powdery" substance on tray table and noted to be snorting something off of his finger.  This RN made oncoming provider Chinita Greenland, NP aware and will make charge RN aware as well.  Pharmacy able to change PO oxy to liquid, due to day shift concerns that patient has been cheeking this medication.  Will continue to monitor for any changes in patient condition and behavior.

## 2021-12-27 NOTE — Progress Notes (Signed)
PROGRESS NOTE    Jason Buchanan  [id number] DOB: 1986/05/04 DOA: 11/14/2021 PCP: [name], NP   Brief Narrative:   36 year old male with medical history significant for polysubstance abuse including intravenous drug abuse presented to hospital with bilateral arm pain from injection to arms bilaterally.  CT scan of the upper extremity showed extensive cellulitis and was initially started on broad-spectrum antibiotic.  After hospitalization, patient has undergone multiple bilateral upper extremity I&D's by hand surgery and plastic surgery.  Blood culture was also positive for beta Streptococcus.  On 11/07/2021, patient developed melena with active GI bleed requiring PRBC transfusion.  GI was consulted and patient underwent EGD confirming mild proximal mild esophagitis with nonbleeding ulcers, nonbleeding duodenal ulcers which were injected.  Post procedure, he developed respiratory failure requiring intubation and mechanical ventilation and was subsequently extubated on 11/17/2021.  Patient was subsequently considered stable for transfer out of the ICU and is currently in the hospitalist service.  Patient has been having multiple surgical intervention during hospitalization.  Assessment & Plan:  Sepsis due to bilateral upper extremity cellulitis/streptococcal bacteremia Patient has completed course of antibiotic as per ID.  Was on daptomycin for a prolonged duration. Currently on Dilaudid,Tylenol oxycodone and gabapentin for pain management.  Patient is afebrile, no leukocytosis.  Patient continues to require IV Dilaudid for adequate control of pain.  Patient has had several I&D's during hospitalization.  Plastic surgery planning for further operative intervention including wound VAC change and subsequent skin graft.            Polysubstance abuse  History of IV drug abuse.  Patient wishes to undergo drug treatment program after discharge.     Acute blood loss anemia due to upper GI  bleed. Hold Toradol and NSAIDs.  Continue to monitor symptoms.   Latest hemoglobin of 11.4.  EGD done on 11/17/2021 showed mild esophagitis and nonbleeding gastric ulcer.  Status post 2 units of packed RBCs.  On iron sulfate.  Hemoglobin has remained stable.   Postoperative respiratory failure. Resolved at this time.  Initially required intubation.  Reactive thrombocytosis.  Continue to monitor.     Amphetamine and psychostimulant-induced psychosis with hallucinations  Continue Seroquel and trazodone.       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   Disposition Plan: home  DVT prophylaxis: SCD  Code Status: Full code  Family Communication:   Spoke with the patient at bedside.  Consultants:  Hand surgery Plastic surgery Orthopedic surgery PCCM Wound Care Psychiatry GI  Procedures:  12/08/21- I&D and debridement of LUE and L hand 12/07/21- Wound vac change of RUE, I&D of deep abscess of  R upper arm 12/03/21- I&D 12/01/21- I&D Incision and drainage and debridement right upper extremity/incision and drainage of left upper extremity by Dr. Lenon Curt hand surgeon 11/23/2021. Wound evaluation left upper extremity with dressing change under sedation/incision and drainage right upper extremity and loose closure of wound, right upper extremity per Dr. Lenon Curt, hand surgeon 11/18/2021 Right hand fasciotomy/right hand carpal tunnel release/right hand renal scan no release/right forearm 3 compartment fasciotomy/thorough irrigation of hand and forearm/dorsal hand incision and drainage including extensor tendon sheath per Dr. Lenon Curt, hand surgeon 11/17/2021 Central line insertion per PCCM, Dr. Tamala Julian 11/17/2021 Upper endoscopy per GI, Dr. Watt Climes 11/17/2021 Left wrist deep abscess incision and drainage/left volar forearm fasciotomy/left wrist carpal tunnel release extended with Guyon's canal  release--per Dr. Greta Doom orthopedics 11/15/2021  12/20 and  12/23: Status post incision and drainage of right and left upper extremity 01/04 wound vac change of the right upper extremity, I&D of deep abscess of the right upper arm.  1/11 Debridement with preparation of skin graft 21x8 cm and 8x2 right arm, Application of wound vac dressing right arm.  01/19 debridement with preparation of skin graft right arm 21x6 and 8,2 right upper arm. Placement of wound vac   Antimicrobials:  None    Subjective: Today, patient was seen and examined at bedside.  Still complains of mild pain in the upper extremity.     Objective: Vitals:   12/25/21 2130 12/26/21 0601 12/26/21 2026 12/27/21 0524  BP: (!) 171/81 (!) 147/71 (!) 146/71 136/74  Pulse: (!) 102 94 (!) 103 94  Resp:  20 18 18   Temp:  97.7 F (36.5 C) 97.9 F (36.6 C) 98.1 F (36.7 C)  TempSrc:  Oral    SpO2:  98% 99% 97%  Weight:      Height:        Intake/Output Summary (Last 24 hours) at 12/27/2021 1122 Last data filed at 12/27/2021 0600 Gross per 24 hour  Intake 231.01 ml  Output 500 ml  Net -268.99 ml    Filed Weights   11/14/21 0835 11/15/21 1608 11/25/21 1342  Weight: 60 kg 60 kg 60 kg   Physical examination  General:  Average built, not in obvious distress, deconditioned. HENT:   No scleral pallor or icterus noted. Oral mucosa is moist.  Chest:   Diminished breath sounds bilaterally. No crackles or wheezes.  CVS: S1 &S2 heard. No murmur.  Regular rate and rhythm. Abdomen: Soft, nontender, nondistended.  Bowel sounds are heard.   Extremities: Bilateral upper extremity Ace bandages in place  psych: Alert, awake and oriented, normal mood CNS:  No cranial nerve deficits.  Power equal in all extremities.   Skin: Warm and dry.  Bilateral upper extremity Ace bandage in place.   Data Reviewed: I have personally reviewed following labs and imaging studies  CBC: Recent Labs  Lab 12/23/21 0434 12/24/21 0802  12/26/21 0506  WBC 13.1* 10.4 10.0  HGB 10.9* 10.1* 11.4*  HCT 36.0* 33.3* 37.8*  MCV 87.4 88.1 87.3  PLT 483* 419* 431*    Basic Metabolic Panel: Recent Labs  Lab 12/23/21 0434 12/26/21 0506  NA 135 134*  K 3.7 3.9  CL 103 101  CO2 23 24  GLUCOSE 136* 122*  BUN 15 19  CREATININE 0.49* 0.44*  CALCIUM 8.6* 9.0  MG 2.0  --     GFR: Estimated Creatinine Clearance: 109.4 mL/min (A) (by C-G formula based on SCr of 0.44 mg/dL (L)). Liver Function Tests: No results for input(s): AST, ALT, ALKPHOS, BILITOT, PROT, ALBUMIN in the last 168 hours. No results for input(s): LIPASE, AMYLASE in the last 168 hours. No results for input(s): AMMONIA in the last 168 hours. Coagulation Profile: No results for input(s): INR, PROTIME in the last 168 hours. Cardiac Enzymes: Recent Labs  Lab 12/23/21 0434  CKTOTAL 27*    BNP (last 3 results) No results for input(s): PROBNP in the last 8760 hours. HbA1C: No results for input(s): HGBA1C in the last 72 hours. CBG: No results for input(s): GLUCAP in the last 168 hours. Lipid Profile: No results for input(s): CHOL, HDL, LDLCALC, TRIG, CHOLHDL, LDLDIRECT in the last 72 hours. Thyroid Function Tests: No results for input(s): TSH, T4TOTAL, FREET4, T3FREE, THYROIDAB in the last 72 hours. Anemia Panel: No results for input(s): VITAMINB12,  FOLATE, FERRITIN, TIBC, IRON, RETICCTPCT in the last 72 hours. Sepsis Labs: No results for input(s): PROCALCITON, LATICACIDVEN in the last 168 hours.  No results found for this or any previous visit (from the past 240 hour(s)).    Radiology Studies: No results found.  Scheduled Meds:  (feeding supplement) PROSource Plus  30 mL Oral TID BM   acetaminophen  500 mg Oral Q6H   vitamin C  500 mg Oral Daily   Chlorhexidine Gluconate Cloth  6 each Topical Daily   feeding supplement  237 mL Oral TID BM   ferrous sulfate  325 mg Oral Q breakfast   gabapentin  400 mg Oral TID   magnesium oxide  400 mg Oral  BID   nicotine  21 mg Transdermal Daily   pantoprazole  40 mg Oral BID   polyethylene glycol  17 g Oral BID   QUEtiapine  100 mg Oral BID   Continuous Infusions:  lactated ringers 20 mL/hr at 12/25/21 1552     LOS: 40 days   Flora Lipps, MD  [facility] If 7PM-7AM, please contact night-coverage [email or web address] 12/27/2021, 11:22 AM

## 2021-12-28 DIAGNOSIS — L0390 Cellulitis, unspecified: Secondary | ICD-10-CM | POA: Diagnosis not present

## 2021-12-28 DIAGNOSIS — F15951 Other stimulant use, unspecified with stimulant-induced psychotic disorder with hallucinations: Secondary | ICD-10-CM | POA: Diagnosis not present

## 2021-12-28 DIAGNOSIS — D62 Acute posthemorrhagic anemia: Secondary | ICD-10-CM | POA: Diagnosis not present

## 2021-12-28 DIAGNOSIS — L03113 Cellulitis of right upper limb: Secondary | ICD-10-CM | POA: Diagnosis not present

## 2021-12-28 MED ORDER — CHLORHEXIDINE GLUCONATE CLOTH 2 % EX PADS: 6.0000 | MEDICATED_PAD | Freq: Once | CUTANEOUS | Status: DC

## 2021-12-28 MED ORDER — CEFAZOLIN SODIUM-DEXTROSE 2-4 GM/100ML-% IV SOLN
2.0000 g | INTRAVENOUS | Status: AC
  Administered 2021-12-29: 2 g via INTRAVENOUS
  Filled 2021-12-28: qty 100

## 2021-12-28 MED ORDER — CHLORHEXIDINE GLUCONATE CLOTH 2 % EX PADS
6.0000 | MEDICATED_PAD | Freq: Once | CUTANEOUS | Status: AC
  Administered 2021-12-28: 6 via TOPICAL

## 2021-12-28 NOTE — Progress Notes (Signed)
PROGRESS NOTE    Jason Buchanan  [id number] DOB: 01-31-1986 DOA: 11/14/2021 PCP: [name], NP   Brief Narrative:   36 year old male with medical history significant for polysubstance abuse including intravenous drug abuse presented to hospital with bilateral arm pain from injection to arms bilaterally.  CT scan of the upper extremity showed extensive cellulitis and was initially started on broad-spectrum antibiotic.  After hospitalization, patient has undergone multiple bilateral upper extremity I&D's by hand surgery and plastic surgery.  Blood culture was also positive for beta Streptococcus.  On 11/07/2021, patient developed melena with active GI bleed requiring PRBC transfusion.  GI was consulted and patient underwent EGD confirming mild proximal mild esophagitis with nonbleeding ulcers, nonbleeding duodenal ulcers which were injected.  Post procedure, he developed respiratory failure requiring intubation and mechanical ventilation and was subsequently extubated on 11/17/2021.  Patient was subsequently considered stable for transfer out of the ICU and is currently in the hospitalist service.  Patient has been having multiple surgical intervention during hospitalization.  Assessment & Plan:  Sepsis due to bilateral upper extremity cellulitis/streptococcal bacteremia Patient has completed course of antibiotic as per ID.  Patient was on daptomycin for a prolonged duration. Currently on Dilaudid,Tylenol oxycodone and gabapentin for pain management.  Patient is afebrile, no leukocytosis.  Patient continues to require IV Dilaudid for adequate control of pain.  Patient has had several I&D's during hospitalization.  As per the patient, plastic surgery planning for further operative intervention including wound VAC change and subsequent skin graft.            Polysubstance abuse  History of IV drug abuse.  Patient wishes to undergo drug treatment program after discharge.     Acute blood loss  anemia due to upper GI bleed. Hold Toradol and NSAIDs.  Continue to monitor symptoms.   Latest hemoglobin of 11.4.  EGD done on 11/17/2021 showed mild esophagitis and nonbleeding gastric ulcer.  Status post 2 units of packed RBCs during hospitalization..  On iron sulfate.  Hemoglobin has remained stable.   Postoperative respiratory failure. Resolved at this time.  Initially required intubation.  Reactive thrombocytosis.  Continue to monitor.     Amphetamine and psychostimulant-induced psychosis with hallucinations  Continue Seroquel and trazodone.  There was some concern of ongoing illicit substance use in the hospital     Hepatitis C 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 Follow-up with hepatology as outpatient     Protein-calorie malnutrition, severe Continue nutritional support.   Disposition Plan: home  DVT prophylaxis: SCD  Code Status: Full code  Family Communication:   Spoke with the patient at bedside.  Consultants:  Hand surgery Plastic surgery Orthopedic surgery PCCM Wound Care Psychiatry GI  Procedures:  12/08/21- I&D and debridement of LUE and L hand 12/07/21- Wound vac change of RUE, I&D of deep abscess of  R upper arm 12/03/21- I&D 12/01/21- I&D Incision and drainage and debridement right upper extremity/incision and drainage of left upper extremity by Dr. Lenon Curt hand surgeon 11/23/2021. Wound evaluation left upper extremity with dressing change under sedation/incision and drainage right upper extremity and loose closure of wound, right upper extremity per Dr. Lenon Curt, hand surgeon 11/18/2021 Right hand fasciotomy/right hand carpal tunnel release/right hand renal scan no release/right forearm 3 compartment fasciotomy/thorough irrigation of hand and forearm/dorsal hand incision and drainage including extensor tendon sheath per Dr. Lenon Curt, hand surgeon 11/17/2021 Central line insertion per PCCM, Dr. Tamala Julian 11/17/2021 Upper endoscopy per GI, Dr. Watt Climes 11/17/2021 Left wrist deep abscess  incision and drainage/left volar  forearm fasciotomy/left wrist carpal tunnel release extended with Guyon's canal release--per Dr. Greta Doom orthopedics 11/15/2021  12/20 and 12/23: Status post incision and drainage of right and left upper extremity 01/04 wound vac change of the right upper extremity, I&D of deep abscess of the right upper arm.  1/11 Debridement with preparation of skin graft 21x8 cm and 8x2 right arm, Application of wound vac dressing right arm.  01/19 debridement with preparation of skin graft right arm 21x6 and 8,2 right upper arm. Placement of wound vac   Antimicrobials:  None    Subjective: Today, patient was seen and examined at bedside.  Patient complains of mild pain.  Denies any fever chills or rigor.  Objective: Vitals:   12/26/21 2026 12/27/21 0524 12/27/21 1329 12/27/21 2042  BP: (!) 146/71 136/74 139/87 (!) 159/88  Pulse: (!) 103 94 68 (!) 105  Resp: 18 18  18   Temp: 97.9 F (36.6 C) 98.1 F (36.7 C) 98.2 F (36.8 C) 98.9 F (37.2 C)  TempSrc:   Axillary   SpO2: 99% 97% 97% 98%  Weight:      Height:        Intake/Output Summary (Last 24 hours) at 12/28/2021 0954 Last data filed at 12/28/2021 0700 Gross per 24 hour  Intake 1845.39 ml  Output 5650 ml  Net -3804.61 ml    Filed Weights   11/14/21 0835 11/15/21 1608 11/25/21 1342  Weight: 60 kg 60 kg 60 kg   Physical examination  General:  Average built, not in obvious distress, deconditioned HENT:   No scleral pallor or icterus noted. Oral mucosa is moist.  Chest:   Diminished breath sounds bilaterally. No crackles or wheezes.  CVS: S1 &S2 heard. No murmur.  Regular rate and rhythm. Abdomen: Soft, nontender, nondistended.  Bowel sounds are heard.   Extremities: Bilateral upper extremity Ace bandages in place, distal capillary refill present. Psych: Alert, awake and oriented, appears slightly anxious at times CNS:  No cranial nerve deficits.  Power equal in all extremities.   Skin: Warm and dry.   Bilateral upper extremity Ace bandage in place  Data Reviewed: I have personally reviewed following labs and imaging studies  CBC: Recent Labs  Lab 12/23/21 0434 12/24/21 0802 12/26/21 0506  WBC 13.1* 10.4 10.0  HGB 10.9* 10.1* 11.4*  HCT 36.0* 33.3* 37.8*  MCV 87.4 88.1 87.3  PLT 483* 419* 431*    Basic Metabolic Panel: Recent Labs  Lab 12/23/21 0434 12/26/21 0506  NA 135 134*  K 3.7 3.9  CL 103 101  CO2 23 24  GLUCOSE 136* 122*  BUN 15 19  CREATININE 0.49* 0.44*  CALCIUM 8.6* 9.0  MG 2.0  --     GFR: Estimated Creatinine Clearance: 109.4 mL/min (A) (by C-G formula based on SCr of 0.44 mg/dL (L)). Liver Function Tests: No results for input(s): AST, ALT, ALKPHOS, BILITOT, PROT, ALBUMIN in the last 168 hours. No results for input(s): LIPASE, AMYLASE in the last 168 hours. No results for input(s): AMMONIA in the last 168 hours. Coagulation Profile: No results for input(s): INR, PROTIME in the last 168 hours. Cardiac Enzymes: Recent Labs  Lab 12/23/21 0434  CKTOTAL 27*    BNP (last 3 results) No results for input(s): PROBNP in the last 8760 hours. HbA1C: No results for input(s): HGBA1C in the last 72 hours. CBG: No results for input(s): GLUCAP in the last 168 hours. Lipid Profile: No results for input(s): CHOL, HDL, LDLCALC, TRIG, CHOLHDL, LDLDIRECT in the last 72  hours. Thyroid Function Tests: No results for input(s): TSH, T4TOTAL, FREET4, T3FREE, THYROIDAB in the last 72 hours. Anemia Panel: No results for input(s): VITAMINB12, FOLATE, FERRITIN, TIBC, IRON, RETICCTPCT in the last 72 hours. Sepsis Labs: No results for input(s): PROCALCITON, LATICACIDVEN in the last 168 hours.  No results found for this or any previous visit (from the past 240 hour(s)).    Radiology Studies: No results found.  Scheduled Meds:  (feeding supplement) PROSource Plus  30 mL Oral TID BM   acetaminophen  500 mg Oral Q6H   vitamin C  500 mg Oral Daily   Chlorhexidine  Gluconate Cloth  6 each Topical Daily   feeding supplement  237 mL Oral TID BM   ferrous sulfate  325 mg Oral Q breakfast   gabapentin  400 mg Oral TID   magnesium oxide  400 mg Oral BID   nicotine  21 mg Transdermal Daily   pantoprazole  40 mg Oral BID   polyethylene glycol  17 g Oral BID   QUEtiapine  100 mg Oral BID   Continuous Infusions:  lactated ringers 20 mL/hr at 12/25/21 1552     LOS: 44 days   Flora Lipps, MD  [facility] If 7PM-7AM, please contact night-coverage [email or web address] 12/28/2021, 9:54 AM

## 2021-12-28 NOTE — Progress Notes (Signed)
Nutrition Follow-up  DOCUMENTATION CODES:   Severe malnutrition in context of acute illness/injury  INTERVENTION:   -Needs updated weight for admission  -Ensure Enlive po TID, each supplement provides 350 kcal and 20 grams of protein    -Multivitamin with minerals daily   -500 mg Vitamin daily   -Prosource Plus PO TID, each provides 100 kcals and 15g protein  NUTRITION DIAGNOSIS:   Severe Malnutrition related to acute illness, wound healing as evidenced by severe fat depletion, severe muscle depletion.  Ongoing.  GOAL:   Patient will meet greater than or equal to 90% of their needs  Progressing.  MONITOR:   PO intake, Supplement acceptance, Labs, Weight trends, I & O's, Skin  ASSESSMENT:   36 year old man with history of Polysubstance abuse who presented to the hospital with arm swelling, erythema and pain. Patient is a poor historian but  reports history of injecting multiple drugs in both arms and currently receiving treatment for severe sepsis of right upper extremities complicated with streptococcus bacteremia.  12/12 admitted, NPO ->HH diet 12/13: NPO, s/p I&D of left wrist deep abscess, left volar forearm fasciotomy, left wrist carpal tunnel release 12/14: Regular ->NPO 12/15: s/p right hand fasciotomy, right hand carpal tunnel release, right forearm fasciotomy, I&D of hand and forearm, s/p EGD -esophagitis, gastric and duodenal ulcers 12/17: regular diet 12/20: I&D of BUEs 12/23: I&D of bilateral arms 12/29: I&D of bilateral arms w/ Wound VAC  1/4: Wound VAC change, I&D of deep abscess of rt arm 1/5: I&D of LUE, left hand  1/11: s/p debridement of rt arm, skin graft, wound VAC 1/19: s/p debridement, skin graft right arm, Wound VAC placed  Continues to consume 100% of meals. Accepting all protein supplements.  Admission weight: 132 lbs. Needs updated weight (last recorded 12/23)  Medications: Vitamin C, Ferrous sulfate, MAG-OX, Miralax  Labs  reviewed: Low Na  Diet Order:   Diet Order             Diet regular Room service appropriate? Yes; Fluid consistency: Thin  Diet effective now                   EDUCATION NEEDS:   Education needs have been addressed  Skin:  Skin Assessment: Skin Integrity Issues: Skin Integrity Issues:: Incisions Incisions: 12/13: bilateral arms  12/15: right arm 12/20 & 12/23: bilateral arms, 12/29: left arm, 1/4 & 1/11: rt upper arm  Last BM:  1/16 -type 3  Height:   Ht Readings from Last 1 Encounters:  11/25/21 5\' 7"  (1.702 m)    Weight:   Wt Readings from Last 1 Encounters:  11/25/21 60 kg    BMI:  Body mass index is 20.72 kg/m.  Estimated Nutritional Needs:   Kcal:  1800-2000  Protein:  95-110g  Fluid:  2L/day  Clayton Bibles, MS, RD, LDN Inpatient Clinical Dietitian Contact information available via Amion

## 2021-12-29 ENCOUNTER — Inpatient Hospital Stay (HOSPITAL_COMMUNITY): Payer: 59 | Admitting: Anesthesiology

## 2021-12-29 ENCOUNTER — Encounter (HOSPITAL_COMMUNITY)
Admission: EM | Disposition: A | Discharge status: Home / Self Care | Payer: Self-pay | Source: Home / Self Care | Attending: Internal Medicine

## 2021-12-29 ENCOUNTER — Encounter (HOSPITAL_COMMUNITY): Payer: Self-pay | Admitting: Internal Medicine

## 2021-12-29 DIAGNOSIS — S41101A Unspecified open wound of right upper arm, initial encounter: Secondary | ICD-10-CM

## 2021-12-29 DIAGNOSIS — L03113 Cellulitis of right upper limb: Secondary | ICD-10-CM

## 2021-12-29 DIAGNOSIS — L0390 Cellulitis, unspecified: Secondary | ICD-10-CM | POA: Diagnosis not present

## 2021-12-29 DIAGNOSIS — D62 Acute posthemorrhagic anemia: Secondary | ICD-10-CM | POA: Diagnosis not present

## 2021-12-29 DIAGNOSIS — F15951 Other stimulant use, unspecified with stimulant-induced psychotic disorder with hallucinations: Secondary | ICD-10-CM | POA: Diagnosis not present

## 2021-12-29 HISTORY — PX: INCISION AND DRAINAGE OF WOUND: SHX1803

## 2021-12-29 HISTORY — PX: APPLICATION OF WOUND VAC: SHX5189

## 2021-12-29 SURGERY — IRRIGATION AND DEBRIDEMENT WOUND
Anesthesia: General | Site: Arm Upper | Laterality: Right

## 2021-12-29 MED ORDER — PROPOFOL 10 MG/ML IV BOLUS
INTRAVENOUS | Status: DC | PRN
Start: 2021-12-29 — End: 2021-12-29
  Administered 2021-12-29: 200 mg via INTRAVENOUS

## 2021-12-29 MED ORDER — MIDAZOLAM HCL 5 MG/5ML IJ SOLN
INTRAMUSCULAR | Status: DC | PRN
  Administered 2021-12-29: 2 mg via INTRAVENOUS

## 2021-12-29 MED ORDER — BUPIVACAINE-EPINEPHRINE 0.25% -1:200000 IJ SOLN
INTRAMUSCULAR | Status: DC | PRN
  Administered 2021-12-29: 8 mL

## 2021-12-29 MED ORDER — PHENYLEPHRINE HCL (PRESSORS) 10 MG/ML IV SOLN
INTRAVENOUS | Status: AC
  Filled 2021-12-29: qty 1

## 2021-12-29 MED ORDER — MIDAZOLAM HCL 2 MG/2ML IJ SOLN
INTRAMUSCULAR | Status: AC
  Filled 2021-12-29: qty 2

## 2021-12-29 MED ORDER — OXYCODONE HCL 5 MG/5ML PO SOLN: 5.0000 mg | Freq: Once | ORAL | Status: DC | PRN

## 2021-12-29 MED ORDER — HYDROMORPHONE HCL 1 MG/ML IJ SOLN: 0.2500 mg | INTRAMUSCULAR | Status: DC | PRN

## 2021-12-29 MED ORDER — KETOROLAC TROMETHAMINE 30 MG/ML IJ SOLN: 30.0000 mg | Freq: Once | INTRAMUSCULAR | Status: DC | PRN

## 2021-12-29 MED ORDER — MINERAL OIL LIGHT OIL
TOPICAL_OIL | Status: AC
  Filled 2021-12-29: qty 10

## 2021-12-29 MED ORDER — PROMETHAZINE HCL 25 MG/ML IJ SOLN: 6.2500 mg | INTRAMUSCULAR | Status: DC | PRN

## 2021-12-29 MED ORDER — LIDOCAINE HCL (PF) 2 % IJ SOLN
INTRAMUSCULAR | Status: AC
  Filled 2021-12-29: qty 5

## 2021-12-29 MED ORDER — DEXAMETHASONE SODIUM PHOSPHATE 10 MG/ML IJ SOLN
INTRAMUSCULAR | Status: DC | PRN
  Administered 2021-12-29: 10 mg via INTRAVENOUS

## 2021-12-29 MED ORDER — EPINEPHRINE PF 1 MG/ML IJ SOLN
INTRAMUSCULAR | Status: AC
  Filled 2021-12-29: qty 1

## 2021-12-29 MED ORDER — ALBUMIN HUMAN 5 % IV SOLN
INTRAVENOUS | Status: AC
  Filled 2021-12-29: qty 250

## 2021-12-29 MED ORDER — SODIUM CHLORIDE 0.9 % IR SOLN
Status: DC | PRN
  Administered 2021-12-29: 3000 mL

## 2021-12-29 MED ORDER — ONDANSETRON HCL 4 MG/2ML IJ SOLN
INTRAMUSCULAR | Status: DC | PRN
  Administered 2021-12-29: 4 mg via INTRAVENOUS

## 2021-12-29 MED ORDER — AMISULPRIDE (ANTIEMETIC) 5 MG/2ML IV SOLN: 10.0000 mg | Freq: Once | INTRAVENOUS | Status: DC | PRN

## 2021-12-29 MED ORDER — KETOROLAC TROMETHAMINE 30 MG/ML IJ SOLN
INTRAMUSCULAR | Status: DC | PRN
  Administered 2021-12-29: 30 mg via INTRAVENOUS

## 2021-12-29 MED ORDER — LIDOCAINE 2% (20 MG/ML) 5 ML SYRINGE
INTRAMUSCULAR | Status: DC | PRN
  Administered 2021-12-29: 100 mg via INTRAVENOUS

## 2021-12-29 MED ORDER — DEXAMETHASONE SODIUM PHOSPHATE 10 MG/ML IJ SOLN
INTRAMUSCULAR | Status: AC
  Filled 2021-12-29: qty 1

## 2021-12-29 MED ORDER — 0.9 % SODIUM CHLORIDE (POUR BTL) OPTIME
TOPICAL | Status: DC | PRN
  Administered 2021-12-29: 1000 mL

## 2021-12-29 MED ORDER — FENTANYL CITRATE (PF) 100 MCG/2ML IJ SOLN
INTRAMUSCULAR | Status: AC
  Filled 2021-12-29: qty 2

## 2021-12-29 MED ORDER — ONDANSETRON HCL 4 MG/2ML IJ SOLN
INTRAMUSCULAR | Status: AC
  Filled 2021-12-29: qty 2

## 2021-12-29 MED ORDER — ACETAMINOPHEN 10 MG/ML IV SOLN
INTRAVENOUS | Status: AC
  Filled 2021-12-29: qty 100

## 2021-12-29 MED ORDER — PROPOFOL 10 MG/ML IV BOLUS
INTRAVENOUS | Status: AC
  Filled 2021-12-29: qty 20

## 2021-12-29 MED ORDER — KETAMINE HCL 10 MG/ML IJ SOLN
INTRAMUSCULAR | Status: AC
  Filled 2021-12-29: qty 1

## 2021-12-29 MED ORDER — OXYCODONE HCL 5 MG PO TABS: 5.0000 mg | ORAL_TABLET | Freq: Once | ORAL | Status: DC | PRN

## 2021-12-29 MED ORDER — TISSEEL VH 10 ML EX KIT
PACK | CUTANEOUS | Status: AC
  Filled 2021-12-29: qty 1

## 2021-12-29 MED ORDER — ACETAMINOPHEN 10 MG/ML IV SOLN
INTRAVENOUS | Status: DC | PRN
Start: 2021-12-29 — End: 2021-12-29
  Administered 2021-12-29: 1000 mg via INTRAVENOUS

## 2021-12-29 MED ORDER — FENTANYL CITRATE (PF) 100 MCG/2ML IJ SOLN
INTRAMUSCULAR | Status: DC | PRN
  Administered 2021-12-29: 100 ug via INTRAVENOUS

## 2021-12-29 MED ORDER — BUPIVACAINE-EPINEPHRINE 0.25% -1:200000 IJ SOLN
INTRAMUSCULAR | Status: AC
  Filled 2021-12-29: qty 2

## 2021-12-29 MED ORDER — KETAMINE HCL 10 MG/ML IJ SOLN
INTRAMUSCULAR | Status: DC | PRN
Start: 2021-12-29 — End: 2021-12-29
  Administered 2021-12-29: 30 mg via INTRAVENOUS
  Administered 2021-12-29: 20 mg via INTRAVENOUS

## 2021-12-29 MED ORDER — ISOPROPYL ALCOHOL 70 % SOLN
Status: AC
  Filled 2021-12-29: qty 480

## 2021-12-29 MED ORDER — MEPERIDINE HCL 50 MG/ML IJ SOLN: 6.2500 mg | INTRAMUSCULAR | Status: DC | PRN

## 2021-12-29 SURGICAL SUPPLY — 60 items
BAG COUNTER SPONGE SURGICOUNT (BAG) IMPLANT
BAG ZIPLOCK 12X15 (MISCELLANEOUS) ×1 IMPLANT
BLADE CLIPPER SURG (BLADE) ×1 IMPLANT
BLADE DERMATOME SS (BLADE) ×1 IMPLANT
BNDG ELASTIC 4X5.8 VLCR STR LF (GAUZE/BANDAGES/DRESSINGS) ×2 IMPLANT
BNDG GAUZE ELAST 4 BULKY (GAUZE/BANDAGES/DRESSINGS) IMPLANT
CANISTER WOUND CARE 500ML ATS (WOUND CARE) ×1 IMPLANT
CNTNR URN SCR LID CUP LEK RST (MISCELLANEOUS) ×1 IMPLANT
CONT SPEC 4OZ STRL OR WHT (MISCELLANEOUS)
COVER SURGICAL LIGHT HANDLE (MISCELLANEOUS) ×2 IMPLANT
DECANTER SPIKE VIAL GLASS SM (MISCELLANEOUS) ×1 IMPLANT
DEPRESSOR TONGUE BLADE STERILE (MISCELLANEOUS) ×2 IMPLANT
DERMACARRIERS GRAFT 1 TO 1.5 (DISPOSABLE)
DRAPE INCISE IOBAN 66X45 STRL (DRAPES) ×1 IMPLANT
DRAPE LAPAROTOMY T 102X78X121 (DRAPES) ×1 IMPLANT
DRAPE UTILITY XL STRL (DRAPES) ×1 IMPLANT
DRSG ADAPTIC 3X8 NADH LF (GAUZE/BANDAGES/DRESSINGS) ×1 IMPLANT
DRSG HYDROCOLLOID 4X4 (GAUZE/BANDAGES/DRESSINGS) ×1 IMPLANT
DRSG MEPITEL 8X12 (GAUZE/BANDAGES/DRESSINGS) ×1 IMPLANT
DRSG PAD ABDOMINAL 8X10 ST (GAUZE/BANDAGES/DRESSINGS) IMPLANT
DRSG VAC ATS MED SENSATRAC (GAUZE/BANDAGES/DRESSINGS) ×1 IMPLANT
ELECT REM PT RETURN 15FT ADLT (MISCELLANEOUS) ×2 IMPLANT
GAUZE SPONGE 4X4 12PLY STRL (GAUZE/BANDAGES/DRESSINGS) ×3 IMPLANT
GAUZE XEROFORM 5X9 LF (GAUZE/BANDAGES/DRESSINGS) IMPLANT
GLOVE SURG ENC MOIS LTX SZ6.5 (GLOVE) ×2 IMPLANT
GOWN STRL REUS W/TWL LRG LVL3 (GOWN DISPOSABLE) ×4 IMPLANT
GRAFT DERMACARRIERS 1 TO 1.5 (DISPOSABLE) ×1 IMPLANT
GRAFT MYRIAD 3 LAYER 10X20 (Graft) ×1 IMPLANT
HANDPIECE INTERPULSE COAX TIP (DISPOSABLE)
KIT BASIN OR (CUSTOM PROCEDURE TRAY) ×2 IMPLANT
KIT TURNOVER KIT A (KITS) IMPLANT
MANIFOLD NEPTUNE II (INSTRUMENTS) ×2 IMPLANT
NDL HYPO 25X1 1.5 SAFETY (NEEDLE) IMPLANT
NEEDLE HYPO 22GX1.5 SAFETY (NEEDLE) ×1 IMPLANT
NEEDLE HYPO 25X1 1.5 SAFETY (NEEDLE) ×2 IMPLANT
NS IRRIG 1000ML POUR BTL (IV SOLUTION) ×2 IMPLANT
PACK GENERAL/GYN (CUSTOM PROCEDURE TRAY) ×1 IMPLANT
PACK ORTHO EXTREMITY (CUSTOM PROCEDURE TRAY) ×2 IMPLANT
PAD CAST 4YDX4 CTTN HI CHSV (CAST SUPPLIES) ×1 IMPLANT
PADDING CAST COTTON 4X4 STRL (CAST SUPPLIES)
PENCIL SMOKE EVACUATOR (MISCELLANEOUS) IMPLANT
POWDER MYRIAD MORCELLS 1000MG (Miscellaneous) ×1 IMPLANT
POWDER MYRIAD MORCELLS 500MG (Miscellaneous) ×2 IMPLANT
SET HNDPC FAN SPRY TIP SCT (DISPOSABLE) IMPLANT
SET IRRIG Y TYPE TUR BLADDER L (SET/KITS/TRAYS/PACK) ×1 IMPLANT
SOL SCRUB PVP POV-IOD 4OZ 7.5% (MISCELLANEOUS) ×2
SOLUTION SCRB POV-IOD 4OZ 7.5% (MISCELLANEOUS) ×1 IMPLANT
STAPLER VISISTAT 35W (STAPLE) ×1 IMPLANT
SUT MNCRL AB 4-0 PS2 18 (SUTURE) ×1 IMPLANT
SUT PDS AB 5-0 PS2 (SUTURE) ×1 IMPLANT
SUT SILK 4 0 PS 2 (SUTURE) IMPLANT
SUT VIC AB 2-0 SH 18 (SUTURE) IMPLANT
SUT VIC AB 3-0 SH 8-18 (SUTURE) ×1 IMPLANT
SUT VIC AB 5-0 PS2 18 (SUTURE) ×2 IMPLANT
SWAB COLLECTION DEVICE MRSA (MISCELLANEOUS) IMPLANT
SWAB CULTURE ESWAB REG 1ML (MISCELLANEOUS) ×1 IMPLANT
SYR CONTROL 10ML LL (SYRINGE) ×3 IMPLANT
TOWEL OR 17X26 10 PK STRL BLUE (TOWEL DISPOSABLE) ×2 IMPLANT
UNDERPAD 30X36 HEAVY ABSORB (UNDERPADS AND DIAPERS) ×2 IMPLANT
WATER STERILE IRR 1000ML POUR (IV SOLUTION) ×1 IMPLANT

## 2021-12-29 NOTE — Progress Notes (Signed)
OT Cancellation Note  Patient Details Name: Jason Buchanan MRN: [id number] DOB: 10/01/1986   Cancelled Treatment:    Reason Eval/Treat Not Completed: Other (comment). Patient to OR today for another I & D. Has not had pain medicine and is not allowed anything oral. Will f/u as able.  Carry Ortez L [name] 12/29/2021, 10:54 AM

## 2021-12-29 NOTE — Interval H&P Note (Signed)
History and Physical Interval Note:  12/29/2021 1:24 PM  Jason Buchanan  has presented today for surgery, with the diagnosis of Right Arm Wound.  The various methods of treatment have been discussed with the patient and family. After consideration of risks, benefits and other options for treatment, the patient has consented to  Procedure(s): DEBRIDEMENT RIGHT ARM  WOUND (Right) SKIN GRAFT FULL THICKNESS (Right) SKIN GRAFT SPLIT THICKNESS (Right) APPLICATION OF SKIN SUBSTITUTE (Right) APPLICATION OF WOUND VAC (Right) as a surgical intervention.  The patient's history has been reviewed, patient examined, no change in status, stable for surgery.  I have reviewed the patient's chart and labs.  Questions were answered to the patient's satisfaction.     [name]

## 2021-12-29 NOTE — Progress Notes (Signed)
PROGRESS NOTE    Jason Buchanan  [id number] DOB: 22-Jan-1986 DOA: 11/14/2021 PCP: [name], NP   Brief Narrative:   36 year old male with medical history significant for polysubstance abuse including intravenous drug abuse presented to hospital with bilateral arm pain from injection to arms bilaterally.  CT scan of the upper extremity showed extensive cellulitis and was initially started on broad-spectrum antibiotic.  After hospitalization, patient has undergone multiple bilateral upper extremity I&D's by hand surgery and plastic surgery.  Blood culture was also positive for beta Streptococcus.  On 11/07/2021, patient developed melena with active GI bleed requiring PRBC transfusion.  GI was consulted and patient underwent EGD confirming mild proximal mild esophagitis with nonbleeding ulcers, nonbleeding duodenal ulcers which were injected.  Post procedure, he developed respiratory failure requiring intubation and mechanical ventilation and was subsequently extubated on 11/17/2021.  Patient was subsequently considered stable for transfer out of the ICU and is currently in the hospitalist service.  Patient has been having multiple surgical intervention during hospitalization.  Assessment & Plan:  Sepsis due to bilateral upper extremity cellulitis/streptococcal bacteremia Patient has completed course of antibiotic as per ID.  Patient was on daptomycin for a prolonged duration. Currently on Dilaudid,Tylenol oxycodone and gabapentin for pain management.  Patient is afebrile, no leukocytosis.  Patient continues to require IV Dilaudid for adequate control of pain.  Patient has had several I&D's during hospitalization. Plastic surgery planning for further operative intervention today.             Polysubstance abuse  History of IV drug abuse.  Patient wishes to undergo drug treatment program after discharge.     Acute blood loss anemia due to upper GI bleed. Will continue to hold Toradol and  NSAIDs.  Continue to monitor symptoms.   Latest hemoglobin of 11.4.  EGD done on 11/17/2021 showed mild esophagitis and nonbleeding gastric ulcer.  Status post 2 units of packed RBCs during hospitalization..  On iron sulfate.  Hemoglobin has remained stable.   Postoperative respiratory failure. Resolved at this time.  Initially required intubation.    Reactive thrombocytosis.  Continue to monitor.     Amphetamine and psychostimulant-induced psychosis with hallucinations  Continue Seroquel and trazodone.  Telemetry sitter due to concern for illicit substance use during hospitalization      Hepatitis C 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 Follow-up with hepatology as outpatient     Protein-calorie malnutrition, severe Continue nutritional support.   Disposition Plan: home  DVT prophylaxis: SCD  Code Status: Full code  Family Communication:   Spoke with the patient at bedside.  Consultants:  Hand surgery Plastic surgery Orthopedic surgery PCCM Wound Care Psychiatry GI  Procedures:  12/08/21- I&D and debridement of LUE and L hand 12/07/21- Wound vac change of RUE, I&D of deep abscess of  R upper arm 12/03/21- I&D 12/01/21- I&D Incision and drainage and debridement right upper extremity/incision and drainage of left upper extremity by Dr. Lenon Curt hand surgeon 11/23/2021. Wound evaluation left upper extremity with dressing change under sedation/incision and drainage right upper extremity and loose closure of wound, right upper extremity per Dr. Lenon Curt, hand surgeon 11/18/2021 Right hand fasciotomy/right hand carpal tunnel release/right hand renal scan no release/right forearm 3 compartment fasciotomy/thorough irrigation of hand and forearm/dorsal hand incision and drainage including extensor tendon sheath per Dr. Lenon Curt, hand surgeon 11/17/2021 Central line insertion per PCCM, Dr. Tamala Julian 11/17/2021 Upper endoscopy per GI, Dr. Watt Climes 11/17/2021 Left wrist deep abscess incision and drainage/left volar  forearm fasciotomy/left wrist carpal tunnel release  extended with Guyon's canal release--per Dr. Greta Doom orthopedics 11/15/2021  12/20 and 12/23: Status post incision and drainage of right and left upper extremity 01/04 wound vac change of the right upper extremity, I&D of deep abscess of the right upper arm.  1/11 Debridement with preparation of skin graft 21x8 cm and 8x2 right arm, Application of wound vac dressing right arm.  01/19 debridement with preparation of skin graft right arm 21x6 and 8,2 right upper arm. Placement of wound vac   Antimicrobials:  None    Subjective: Today, patient was seen and examined at bedside.  Still complains of pain on the arms.  Awaiting for surgical intervention.  Objective: Vitals:   12/27/21 2042 12/28/21 1342 12/28/21 1936 12/29/21 0436  BP: (!) 159/88 (!) 160/99 (!) 134/92 127/70  Pulse: (!) 105 (!) 109 (!) 106 (!) 108  Resp: 18 20 20 16   Temp: 98.9 F (37.2 C) (!) 97.4 F (36.3 C) 97.9 F (36.6 C) 98 F (36.7 C)  TempSrc:  Oral    SpO2: 98% 99% 98% 96%  Weight:      Height:        Intake/Output Summary (Last 24 hours) at 12/29/2021 0903 Last data filed at 12/29/2021 0231 Gross per 24 hour  Intake 360 ml  Output 800 ml  Net -440 ml    Filed Weights   11/14/21 0835 11/15/21 1608 11/25/21 1342  Weight: 60 kg 60 kg 60 kg   Physical examination  General:  Average built, not in obvious distress HENT:   No scleral pallor or icterus noted. Oral mucosa is moist.  Chest:  Clear breath sounds.  Diminished breath sounds bilaterally. No crackles or wheezes.  CVS: S1 &S2 heard. No murmur.  Regular rate and rhythm. Abdomen: Soft, nontender, nondistended.  Bowel sounds are heard.   Extremities: Bilateral upper extremity on Ace bandage, distal capillary refill present  psych: Alert, awake and oriented, anxious mood. CNS:  No cranial nerve deficits.  Power equal in all extremities.   Skin: Warm and dry.  Bilateral upper extremity with  dressing.  Data Reviewed: I have personally reviewed following labs and imaging studies  CBC: Recent Labs  Lab 12/23/21 0434 12/24/21 0802 12/26/21 0506  WBC 13.1* 10.4 10.0  HGB 10.9* 10.1* 11.4*  HCT 36.0* 33.3* 37.8*  MCV 87.4 88.1 87.3  PLT 483* 419* 431*    Basic Metabolic Panel: Recent Labs  Lab 12/23/21 0434 12/26/21 0506  NA 135 134*  K 3.7 3.9  CL 103 101  CO2 23 24  GLUCOSE 136* 122*  BUN 15 19  CREATININE 0.49* 0.44*  CALCIUM 8.6* 9.0  MG 2.0  --     GFR: Estimated Creatinine Clearance: 109.4 mL/min (A) (by C-G formula based on SCr of 0.44 mg/dL (L)). Liver Function Tests: No results for input(s): AST, ALT, ALKPHOS, BILITOT, PROT, ALBUMIN in the last 168 hours. No results for input(s): LIPASE, AMYLASE in the last 168 hours. No results for input(s): AMMONIA in the last 168 hours. Coagulation Profile: No results for input(s): INR, PROTIME in the last 168 hours. Cardiac Enzymes: Recent Labs  Lab 12/23/21 0434  CKTOTAL 27*    BNP (last 3 results) No results for input(s): PROBNP in the last 8760 hours. HbA1C: No results for input(s): HGBA1C in the last 72 hours. CBG: No results for input(s): GLUCAP in the last 168 hours. Lipid Profile: No results for input(s): CHOL, HDL, LDLCALC, TRIG, CHOLHDL, LDLDIRECT in the last 72 hours. Thyroid Function Tests: No  results for input(s): TSH, T4TOTAL, FREET4, T3FREE, THYROIDAB in the last 72 hours. Anemia Panel: No results for input(s): VITAMINB12, FOLATE, FERRITIN, TIBC, IRON, RETICCTPCT in the last 72 hours. Sepsis Labs: No results for input(s): PROCALCITON, LATICACIDVEN in the last 168 hours.  No results found for this or any previous visit (from the past 240 hour(s)).    Radiology Studies: No results found.  Scheduled Meds:  (feeding supplement) PROSource Plus  30 mL Oral TID BM   acetaminophen  500 mg Oral Q6H   vitamin C  500 mg Oral Daily   Chlorhexidine Gluconate Cloth  6 each Topical Daily    Chlorhexidine Gluconate Cloth  6 each Topical Once   feeding supplement  237 mL Oral TID BM   ferrous sulfate  325 mg Oral Q breakfast   gabapentin  400 mg Oral TID   magnesium oxide  400 mg Oral BID   nicotine  21 mg Transdermal Daily   pantoprazole  40 mg Oral BID   polyethylene glycol  17 g Oral BID   QUEtiapine  100 mg Oral BID   Continuous Infusions:   ceFAZolin (ANCEF) IV     lactated ringers 20 mL/hr at 12/25/21 1552     LOS: 70 days   Flora Lipps, MD  [facility] If 7PM-7AM, please contact night-coverage [email or web address] 12/29/2021, 9:03 AM

## 2021-12-29 NOTE — Anesthesia Procedure Notes (Addendum)
Procedure Name: LMA Insertion Date/Time: 12/29/2021 1:42 PM Performed by: Destyne Goodreau D, CRNA Pre-anesthesia Checklist: Patient identified, Emergency Drugs available, Suction available and Patient being monitored Patient Re-evaluated:Patient Re-evaluated prior to induction Oxygen Delivery Method: Circle system utilized Preoxygenation: Pre-oxygenation with 100% oxygen Induction Type: IV induction Ventilation: Mask ventilation without difficulty LMA: LMA inserted LMA Size: 4.0 Tube type: Oral Number of attempts: 2 Placement Confirmation: positive ETCO2 and breath sounds checked- equal and bilateral Tube secured with: Tape Dental Injury: Teeth and Oropharynx as per pre-operative assessment

## 2021-12-29 NOTE — Op Note (Signed)
Operative Note   DATE OF OPERATION: 12/29/2021  SURGICAL DEPARTMENT: Plastic Surgery  PREOPERATIVE DIAGNOSES:  open wound right arm  POSTOPERATIVE DIAGNOSES:  same  PROCEDURE:   1)  Debridement with preparation for graft right arm 20x6 (120 cm2) and 1.5x7 (10.5 cm2) 2)  Placement of Myriad Matrix 20x6 (120 cm2) cm to right arm and 2G of Myriad morcels. 3) Placement of wound vac 130.5 cm2   SURGEON: Taeveon Keesling P. Kaula Klenke, MD  ASSISTANT: none  ANESTHESIA:  General.   COMPLICATIONS: None.   INDICATIONS FOR PROCEDURE:  The patient, Marcanthony Sleight is a 36 y.o. male born on June 12, 1986, is here for treatment of right arm wound MRN: [id number]  CONSENT:  Informed consent was obtained directly from the patient. Risks, benefits and alternatives were fully discussed. Specific risks including but not limited to bleeding, infection, hematoma, seroma, scarring, pain, contracture, asymmetry, wound healing problems, and need for further surgery were all discussed. The patient did have an ample opportunity to have questions answered to satisfaction.   DESCRIPTION OF PROCEDURE:  The patient was taken to the operating room. SCDs were placed and preop antibiotics were given. General anesthesia was administered.  The patient's operative site was prepped and draped in a sterile fashion. A time out was performed and all information was confirmed to be correct.    The right arm then irrigated with copious saline.  Debridement was performed with tenotomy scissors.  Small areas of tendon and muscle were debrided leaving the majority of tendons intact.  A curette was used to debride the muscle by removing a thin layer of granulation tissue.  Additional irrigation was performed.  5 cc of 0.25% marcaine were injected in bleeding areas.  Bovie electrocautery was used for hemostasis.  After confirming hemostasis myriad was placed.  The myriad was 20 x 6 cm and was sutured in place with Vicryl sutures.  Myriad morsels were  then placed under the matrix.  Following this Mepitel was sutured in place over the myriad.  VAC sponge was cut to fit the wounds and VAC dressing was applied over this.  Satisfactory seal was achieved.    The patient tolerated the procedure well.  There were no complications. The patient was allowed to wake from anesthesia, extubated and taken to the recovery room in satisfactory condition.

## 2021-12-29 NOTE — Progress Notes (Signed)
Wound care to R arm order on worklist,  not done as pt is scheduled to go to the OR for I&D at 1315 on 12/29/21

## 2021-12-29 NOTE — Transfer of Care (Addendum)
Immediate Anesthesia Transfer of Care Note  Patient: Jason Buchanan  Procedure(s) Performed: DEBRIDEMENT RIGHT ARM  WOUND (Right: Arm Upper) APPLICATION OF SKIN SUBSTITUTE (Right: Arm Upper) APPLICATION OF WOUND VAC (Right: Arm Upper)  Patient Location: PACU  Anesthesia Type:General  Level of Consciousness: awake, alert  and oriented  Airway & Oxygen Therapy: Patient Spontanous Breathing and Patient connected to face mask oxygen  Post-op Assessment: Report given to RN and Post -op Vital signs reviewed and stable  Post vital signs: Reviewed and stable  Last Vitals:  Vitals Value Taken Time  BP 112/78 12/29/21 1506  Temp    Pulse 105 12/29/21 1507  Resp 14 12/29/21 1507  SpO2 98 % 12/29/21 1507  Vitals shown include unvalidated device data.  Last Pain:  Vitals:   12/29/21 1115  TempSrc:   PainSc: 9       Patients Stated Pain Goal: 5 (12/29/21 0754)  Complications: No notable events documented.

## 2021-12-29 NOTE — Anesthesia Preprocedure Evaluation (Addendum)
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 with prolonged (>30 min) block after GI procedure)  Airway Mallampati: II  TM Distance: >3 FB Neck ROM: Full    Dental  (+) Poor Dentition, Dental Advisory Given   Pulmonary asthma , Current Smoker and Patient abstained from smoking.,    Pulmonary exam normal breath sounds clear to auscultation       Cardiovascular negative cardio ROS Normal cardiovascular exam Rhythm:Regular Rate:Normal     Neuro/Psych PSYCHIATRIC DISORDERS negative neurological ROS     GI/Hepatic negative GI ROS, (+)     substance abuse  cocaine use, methamphetamine use and IV drug use,   Endo/Other  negative endocrine ROS  Renal/GU negative Renal ROS  negative genitourinary   Musculoskeletal negative musculoskeletal ROS (+)   Abdominal   Peds  Hematology  (+) Blood dyscrasia, anemia ,   Anesthesia Other Findings  Presented with sepsis and BUE cellulitis s/p I&D  Reproductive/Obstetrics negative OB ROS                           Anesthesia Physical Anesthesia Plan  ASA: 3  Anesthesia Plan: General   Post-op Pain Management: Ofirmev IV (intra-op), Ketamine IV and Toradol IV (intra-op)   Induction: Intravenous  PONV Risk Score and Plan: 1 and Ondansetron, Dexamethasone, Midazolam and Treatment may vary due to age or medical condition  Airway Management Planned: LMA  Additional Equipment: None  Intra-op Plan:   Post-operative Plan: Extubation in OR  Informed Consent: I have reviewed the patients History and Physical, chart, labs and discussed the procedure including the  risks, benefits and alternatives for the proposed anesthesia with the patient or authorized representative who has indicated his/her understanding and acceptance.     Dental advisory given  Plan Discussed with: CRNA  Anesthesia Plan Comments:        Anesthesia Quick Evaluation

## 2021-12-30 ENCOUNTER — Encounter (HOSPITAL_COMMUNITY): Payer: Self-pay | Admitting: Plastic Surgery

## 2021-12-30 DIAGNOSIS — D62 Acute posthemorrhagic anemia: Secondary | ICD-10-CM | POA: Diagnosis not present

## 2021-12-30 DIAGNOSIS — L03113 Cellulitis of right upper limb: Secondary | ICD-10-CM | POA: Diagnosis not present

## 2021-12-30 DIAGNOSIS — L0390 Cellulitis, unspecified: Secondary | ICD-10-CM | POA: Diagnosis not present

## 2021-12-30 DIAGNOSIS — F15951 Other stimulant use, unspecified with stimulant-induced psychotic disorder with hallucinations: Secondary | ICD-10-CM | POA: Diagnosis not present

## 2021-12-30 LAB — BASIC METABOLIC PANEL
Anion gap: 10 (ref 5–15)
BUN: 19 mg/dL (ref 6–20)
CO2: 22 mmol/L (ref 22–32)
Calcium: 8.4 mg/dL — ABNORMAL LOW (ref 8.9–10.3)
Chloride: 104 mmol/L (ref 98–111)
Creatinine, Ser: 0.47 mg/dL — ABNORMAL LOW (ref 0.61–1.24)
GFR, Estimated: >60 mL/min (ref >60)
Glucose, Bld: 191 mg/dL — ABNORMAL HIGH (ref 70–99)
Potassium: 3.7 mmol/L (ref 3.5–5.1)
Sodium: 136 mmol/L (ref 135–145)

## 2021-12-30 LAB — CBC
HCT: 35 % — ABNORMAL LOW (ref 39.0–52.0)
Hemoglobin: 10.7 g/dL — ABNORMAL LOW (ref 13.0–17.0)
MCH: 26.6 pg (ref 26.0–34.0)
MCHC: 30.6 g/dL (ref 30.0–36.0)
MCV: 87.1 fL (ref 80.0–100.0)
Platelets: 429 10*3/uL — ABNORMAL HIGH (ref 150–400)
RBC: 4.02 MIL/uL — ABNORMAL LOW (ref 4.22–5.81)
RDW: 15.4 % (ref 11.5–15.5)
WBC: 18.8 10*3/uL — ABNORMAL HIGH (ref 4.0–10.5)
nRBC: 0 % (ref 0.0–0.2)

## 2021-12-30 LAB — MAGNESIUM: Magnesium: 1.8 mg/dL (ref 1.7–2.4)

## 2021-12-30 NOTE — Progress Notes (Signed)
PROGRESS NOTE    Jason Buchanan  [id number] DOB: January 29, 1986 DOA: 11/14/2021 PCP: [name], NP   Brief Narrative:   36 year old male with medical history significant for polysubstance abuse including intravenous drug abuse presented to hospital with bilateral arm pain from injection to arms bilaterally.  CT scan of the upper extremity showed extensive cellulitis and was initially started on broad-spectrum antibiotic.  After hospitalization, patient has undergone multiple bilateral upper extremity I&D's by hand surgery and plastic surgery.  Blood culture was also positive for beta Streptococcus.  On 11/07/2021, patient developed melena with active GI bleed requiring PRBC transfusion.  GI was consulted and patient underwent EGD confirming mild proximal mild esophagitis with nonbleeding ulcers, nonbleeding duodenal ulcers which were injected.  Post procedure, he developed respiratory failure requiring intubation and mechanical ventilation and was subsequently extubated on 11/17/2021.  Patient was subsequently considered stable for transfer out of the ICU and is currently in the hospitalist service.  Patient has been having multiple surgical intervention during hospitalization.  Assessment & Plan:  Sepsis due to bilateral upper extremity cellulitis/streptococcal bacteremia Patient has completed course of antibiotic as per ID.  Patient was on daptomycin for a prolonged duration. Currently on Dilaudid,Tylenol oxycodone and gabapentin for pain management.  Patient is afebrile, no leukocytosis.  Patient continues to require IV Dilaudid for adequate control of pain.  Patient has had several I&D's during hospitalization.Patient underwent further debridement yesterday with placement of wound VAC and preparation for right arm graft.     Polysubstance abuse  History of IV drug abuse.  Patient wishes to undergo drug treatment program after discharge.     Acute blood loss anemia due to upper GI  bleed. Will continue to hold Toradol and NSAIDs.  Continue to monitor symptoms.   Latest hemoglobin of 10.7. EGD done on 11/17/2021 showed mild esophagitis and nonbleeding gastric ulcer.  Status post 2 units of packed RBCs during hospitalization..  On iron sulfate.     Postoperative respiratory failure. Resolved at this time.  Initially required intubation.    Reactive thrombocytosis.  Continue to monitor.     Amphetamine and psychostimulant-induced psychosis with hallucinations  Continue Seroquel and trazodone.  Telemetry sitter due to concern for illicit substance use during hospitalization patient states that she feels more depressed and anxious this time and wishes to speak to a psychiatrist.     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   Disposition Plan: home when okay with surgery.  DVT prophylaxis: SCD  Code Status: Full code  Family Communication:   Spoke with the patient at bedside.  Consultants:  Hand surgery Plastic surgery Orthopedic surgery PCCM Wound Care Psychiatry GI  Procedures:  12/08/21- I&D and debridement of LUE and L hand 12/07/21- Wound vac change of RUE, I&D of deep abscess of  R upper arm 12/03/21- I&D 12/01/21- I&D Incision and drainage and debridement right upper extremity/incision and drainage of left upper extremity by Dr. Lenon Curt hand surgeon 11/23/2021. Wound evaluation left upper extremity with dressing change under sedation/incision and drainage right upper extremity and loose closure of wound, right upper extremity per Dr. Lenon Curt, hand surgeon 11/18/2021 Right hand fasciotomy/right hand carpal tunnel release/right hand renal scan no release/right forearm 3 compartment fasciotomy/thorough irrigation of hand and forearm/dorsal hand incision and drainage including extensor tendon sheath per Dr. Lenon Curt, hand surgeon 11/17/2021 Central line  insertion per PCCM, Dr. Tamala Julian 11/17/2021 Upper endoscopy per GI, Dr.  Magod 11/17/2021 Left wrist deep abscess incision and drainage/left volar forearm fasciotomy/left wrist carpal tunnel release extended with Guyon's canal release--per Dr. Greta Doom orthopedics 11/15/2021  12/20 and 12/23: Status post incision and drainage of right and left upper extremity 01/04 wound vac change of the right upper extremity, I&D of deep abscess of the right upper arm.  1/11 Debridement with preparation of skin graft 21x8 cm and 8x2 right arm, Application of wound vac dressing right arm.  01/19 debridement with preparation of skin graft right arm 21x6 and 8,2 right upper arm. Placement of wound vac  1/27 and with preparation for skin graft.  Antimicrobials:  None    Subjective: Today, patient was seen and examined at bedside.  Patient denies any nausea vomiting fever chills but complains of pain over the arm.  States that he feels more depressed and anxious and wishes to talk to a psychiatrist.  Objective: Vitals:   12/29/21 1626 12/29/21 2130 12/30/21 0032 12/30/21 0456  BP: 140/69 113/87 106/63 120/67  Pulse: 100 (!) 108 65 (!) 108  Resp: (!) 22 16 18 20   Temp: 98.9 F (37.2 C) 97.8 F (36.6 C) 98.7 F (37.1 C) 97.6 F (36.4 C)  TempSrc:  Oral Oral Oral  SpO2: 97%  99% 95%  Weight:      Height:        Intake/Output Summary (Last 24 hours) at 12/30/2021 1124 Last data filed at 12/30/2021 0300 Gross per 24 hour  Intake 1400 ml  Output 1050 ml  Net 350 ml    Filed Weights   11/15/21 1608 11/25/21 1342 12/29/21 1000  Weight: 60 kg 60 kg 78.2 kg   Physical examination  General:  Average built, not in obvious distress HENT:   No scleral pallor or icterus noted. Oral mucosa is moist.  Chest:   Diminished breath sounds bilaterally. No crackles or wheezes.  CVS: S1 &S2 heard. No murmur.  Regular rate and rhythm. Abdomen: Soft, nontender, nondistended.  Bowel sounds are heard.   Extremities:  Bilateral upper extremity status post surgery, left wrist on Ace bandage, distal capillary refill present Psych: Alert, awake and oriented, normal mood CNS:  No cranial nerve deficits.  Power equal in all extremities.   Skin: Warm and dry.  Bilateral upper extremity status post surgical intervention.   Data Reviewed: I have personally reviewed following labs and imaging studies  CBC: Recent Labs  Lab 12/24/21 0802 12/26/21 0506 12/30/21 0748  WBC 10.4 10.0 18.8*  HGB 10.1* 11.4* 10.7*  HCT 33.3* 37.8* 35.0*  MCV 88.1 87.3 87.1  PLT 419* 431* 429*    Basic Metabolic Panel: Recent Labs  Lab 12/26/21 0506 12/30/21 0748  NA 134* 136  K 3.9 3.7  CL 101 104  CO2 24 22  GLUCOSE 122* 191*  BUN 19 19  CREATININE 0.44* 0.47*  CALCIUM 9.0 8.4*  MG  --  1.8    GFR: Estimated Creatinine Clearance: 120.5 mL/min (A) (by C-G formula based on SCr of 0.47 mg/dL (L)). Liver Function Tests: No results for input(s): AST, ALT, ALKPHOS, BILITOT, PROT, ALBUMIN in the last 168 hours. No results for input(s): LIPASE, AMYLASE in the last 168 hours. No results for input(s): AMMONIA in the last 168 hours. Coagulation Profile: No results for input(s): INR, PROTIME in the last 168 hours. Cardiac Enzymes: No results for input(s): CKTOTAL, CKMB, CKMBINDEX, TROPONINI in the last 168 hours.  BNP (last 3 results) No results for input(s): PROBNP in the last 8760 hours. HbA1C:  No results for input(s): HGBA1C in the last 72 hours. CBG: No results for input(s): GLUCAP in the last 168 hours. Lipid Profile: No results for input(s): CHOL, HDL, LDLCALC, TRIG, CHOLHDL, LDLDIRECT in the last 72 hours. Thyroid Function Tests: No results for input(s): TSH, T4TOTAL, FREET4, T3FREE, THYROIDAB in the last 72 hours. Anemia Panel: No results for input(s): VITAMINB12, FOLATE, FERRITIN, TIBC, IRON, RETICCTPCT in the last 72 hours. Sepsis Labs: No results for input(s): PROCALCITON, LATICACIDVEN in the last 168  hours.  No results found for this or any previous visit (from the past 240 hour(s)).    Radiology Studies: No results found.  Scheduled Meds:  (feeding supplement) PROSource Plus  30 mL Oral TID BM   acetaminophen  500 mg Oral Q6H   vitamin C  500 mg Oral Daily   Chlorhexidine Gluconate Cloth  6 each Topical Daily   feeding supplement  237 mL Oral TID BM   ferrous sulfate  325 mg Oral Q breakfast   gabapentin  400 mg Oral TID   magnesium oxide  400 mg Oral BID   nicotine  21 mg Transdermal Daily   pantoprazole  40 mg Oral BID   polyethylene glycol  17 g Oral BID   QUEtiapine  100 mg Oral BID   Continuous Infusions:  lactated ringers 20 mL/hr at 12/29/21 1328     LOS: 48 days   Flora Lipps, MD  [facility] If 7PM-7AM, please contact night-coverage [email or web address] 12/30/2021, 11:24 AM

## 2021-12-30 NOTE — Progress Notes (Signed)
Occupational Therapy Treatment °Patient Details °Name: Jason Buchanan °MRN: [id number] °DOB: 07/03/1986 °Today's Date: 12/30/2021 ° ° °History of present illness Pt is 35 yo male presented on 12/12 with bil UE edema/erythema after IV drug use.  Pt required R hand fasciotomy, carpal tunnel release, 3 compartment forearm fasciotomy, Guyon's canal release , and I and D hand/forearm on 12/15; and L wrist I and D, left volar forearm fasciotmoy, and L wrist carpal tunnel release on 12/13. On 12/14 concern for GIB and required 3 untis PRBC with ED 12/15.  Pt with hx of polysubstance abuse. °  °OT comments ° Treatment focused on PROM/stretching to wrist and fingers bilaterally to increase range of motion - needed for dexterity for functional tasks. Patient has significant pain with stretching but allows therapist to proceed. Therapist reiterated how patient should be stretching his fingers - though he is limited by bilateral impairment. Patient's MCPs on left hand are his most impaired joints with a loss of near 1/2 ROM as well as decreased wrist movement. Patient's finger extension is limited on right with potential tendon shortening as well as impaired wrist ROM. Patient begging therapist to see him more often - he reports he finds it very hard to afflict that much pain on himself. Therapist will attempt as able. Patient may eventually need splinting to assist with return of ROM.   ° °Recommendations for follow up therapy are one component of a multi-disciplinary discharge planning process, led by the attending physician.  Recommendations may be updated based on patient status, additional functional criteria and insurance authorization. °   °Follow Up Recommendations ° Outpatient OT  °  °Assistance Recommended at Discharge PRN  °Patient can return home with the following ° Assistance with cooking/housework;A lot of help with bathing/dressing/bathroom °  °Equipment Recommendations ° None recommended by OT  °  °Recommendations  for Other Services   ° °  °Precautions / Restrictions Precautions °Precaution Comments: wound vac RUE °Restrictions °Other Position/Activity Restrictions: no restrictions on BUE per Lleyton Jeffery PA on secure chat 11/21/21  ° ° °  ° °Mobility Bed Mobility °  °  °  °  °  °  °  °  °  ° °Transfers °  °  °  °  °  °  °  °  °  °  °  °  °Balance   °  °  °  °  °  °  °  °  °  °  °  °  °  °  °  °  °  °  °   ° °ADL either performed or assessed with clinical judgement  ° °ADL   °  °  °  °  °  °  °  °  °  °  °  °  °  °  °  °  °  °  °  °  °  ° °Extremity/Trunk Assessment   °  °  °  °  °  ° °Vision   °  °  °Perception   °  °Praxis   °  ° °Cognition Arousal/Alertness: Awake/alert °Behavior During Therapy: WFL for tasks assessed/performed °Overall Cognitive Status: Within Functional Limits for tasks assessed °  °  °  °  °  °  °  °  °  °  °  °  °  °  °  °  °  °  °  °   °Exercises Other Exercises °Other   Exercises: LUE: Therapist provided PROM/stretching to MCPs of left hand and reiterated to patient on how to perform on his own. His wrist was able to be slightly extended to approx 20 degrees extension. Other Exercises: RUE: Therapist provided PROM/strectching to fingers to improve finger extension with wrist in neutral. Patient has kept wrist flexed and PIPs and DIPS have functional ROM - but with wrist extension patient appears to have some tendon shortening and requires moderate effort to get fingers straighter.    Shoulder Instructions       General Comments      Pertinent Vitals/ Pain       Pain Assessment Pain Assessment: 0-10 Pain Score: 10-Worst pain ever Pain Location: with PROM/stretching of fingers, wrist bilaterally Pain Descriptors / Indicators: Discomfort, Grimacing, Guarding, Restless, Moaning Pain Intervention(s): Limited activity within patient's tolerance, Premedicated before session  Home Living                                          Prior Functioning/Environment               Frequency  Min 1X/week        Progress Toward Goals  OT Goals(current goals can now be found in the care plan section)  Progress towards OT goals: Goals met and updated - see care plan  Acute Rehab OT Goals Patient Stated Goal: to improve grasp OT Goal Formulation: With patient Time For Goal Achievement: 01/13/22 Potential to Achieve Goals: Good  Plan Discharge plan remains appropriate    Co-evaluation                 AM-PAC OT "6 Clicks" Daily Activity     Outcome Measure   Help from another person eating meals?: None Help from another person taking care of personal grooming?: None Help from another person toileting, which includes using toliet, bedpan, or urinal?: A Little Help from another person bathing (including washing, rinsing, drying)?: A Little Help from another person to put on and taking off regular upper body clothing?: None Help from another person to put on and taking off regular lower body clothing?: A Little 6 Click Score: 21    End of Session    OT Visit Diagnosis: Muscle weakness (generalized) (M62.81);Pain   Activity Tolerance Patient tolerated treatment well   Patient Left in bed;with call bell/phone within reach   Nurse Communication Patient requests pain meds        Time: 0957-1020 OT Time Calculation (min): 23 min  Charges: OT Treatments $Therapeutic Exercise: 23-37 mins  Kellyjo Edgren, OTR/L [place]  Office [phone] Pager: [place] 12/30/2021, 1:34 PM

## 2021-12-30 NOTE — Anesthesia Postprocedure Evaluation (Signed)
Anesthesia Post Note  Patient: Jason Buchanan  Procedure(s) Performed: DEBRIDEMENT RIGHT ARM  WOUND (Right: Arm Upper) APPLICATION OF SKIN SUBSTITUTE (Right: Arm Upper) APPLICATION OF WOUND VAC (Right: Arm Upper)     Patient location during evaluation: PACU Anesthesia Type: General Level of consciousness: awake and alert Pain management: pain level controlled Vital Signs Assessment: post-procedure vital signs reviewed and stable Respiratory status: spontaneous breathing, nonlabored ventilation, respiratory function stable and patient connected to nasal cannula oxygen Cardiovascular status: blood pressure returned to baseline and stable Postop Assessment: no apparent nausea or vomiting Anesthetic complications: no   No notable events documented.  Last Vitals:  Vitals:   12/30/21 0032 12/30/21 0456  BP: 106/63 120/67  Pulse: 65 (!) 108  Resp: 18 20  Temp: 37.1 C 36.4 C  SpO2: 99% 95%    Last Pain:  Vitals:   12/30/21 0730  TempSrc:   PainSc: 7                  Kaytlin Burklow L Zayna Toste

## 2021-12-31 DIAGNOSIS — F15951 Other stimulant use, unspecified with stimulant-induced psychotic disorder with hallucinations: Secondary | ICD-10-CM | POA: Diagnosis not present

## 2021-12-31 DIAGNOSIS — L0390 Cellulitis, unspecified: Secondary | ICD-10-CM | POA: Diagnosis not present

## 2021-12-31 DIAGNOSIS — F1594 Other stimulant use, unspecified with stimulant-induced mood disorder: Secondary | ICD-10-CM

## 2021-12-31 DIAGNOSIS — D62 Acute posthemorrhagic anemia: Secondary | ICD-10-CM | POA: Diagnosis not present

## 2021-12-31 DIAGNOSIS — L03113 Cellulitis of right upper limb: Secondary | ICD-10-CM | POA: Diagnosis not present

## 2021-12-31 MED ORDER — QUETIAPINE FUMARATE ER 300 MG PO TB24
300.0000 mg | ORAL_TABLET | Freq: Every day | ORAL | Status: DC
  Administered 2021-12-31 – 2022-01-17 (×18): 300 mg via ORAL
  Filled 2021-12-31 (×19): qty 1

## 2021-12-31 NOTE — Plan of Care (Signed)

## 2021-12-31 NOTE — Consult Note (Signed)
[facility] Consult   Reason for Consult: ''Bipolar disorder depression, patient feels more depressed and anxious and wishes to talk to a psychiatrist. 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[place]) Active Problems:   Sepsis due to cellulitis ([place])   Polysubstance abuse ([place])   Hypokalemia   IVDU (intravenous drug user)   Streptococcal bacteremia   Right arm cellulitis   Staphylococcus aureus infection   Acute blood loss anemia   Upper GI bleed   Respiratory failure, post-operative (HCC)   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HCC)   Hepatitis C 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   Protein-calorie malnutrition, severe   Bacterial skin infection of upper extremity   Electrolyte abnormality   Encounter for central line care   Total Time spent with patient: 1 hour  Subjective:   Jason Buchanan is a 36 y.o. male patient admitted with bilateral arm pain from intravenous drug abuse.  HPI:  36 year old male with medical history significant for polysubstance abuse including IVDA, anxiety, mood disorder who was admitted to the hospital due to bilateral arm pain secondary to intravenous drug injection. Patient reports that he ha been dealing with mental illness since age 44 and polysubstance abuse for an unknown number of years. He reports being released from prison September, 2022, he was incarcerated for 3 years for identity theft. Patient reports that he is homeless, non-compliant with psychiatric medications and has been self medicating with illicit drugs-Amphetamine, Benzodiazepine and Opiates. Patient denies withdrawal symptoms but reports mood swings characterized by depression alternating with  irritability. He states that Seroquel helped him in the past and will appreciate a higher dose. Patient agreed to be switched to a long acting Seroquel. Today, he denies psychosis, delusions and self harming thoughts.  Past Psychiatric History: as above   Risk to Self:  denies Risk to Others:  denies Prior Inpatient Therapy:   multiple in the past Prior Outpatient Therapy:  reports that he was receiving medications in Prison  Past Medical History:  Past Medical History:  Diagnosis Date   Asthma    Mood disorder ([place])    Polysubstance abuse ([place])     Past Surgical History:  Procedure Laterality Date   ANKLE SURGERY     APPLICATION OF WOUND VAC Right 11/22/2021   Procedure: APPLICATION OF WOUND VAC;  Surgeon: Dayna Barker, MD;  Location: [facility];  Service: Plastics;  Laterality: Right;   APPLICATION OF WOUND VAC Right 11/25/2021   Procedure: APPLICATION OF WOUND VAC;  Surgeon: Dayna Barker, MD;  [facility];  Service: Plastics;  Laterality: Right;   APPLICATION OF WOUND VAC Right 12/07/2021   Procedure: APPLICATION OF WOUND VAC;  Surgeon: Dayna Barker, MD;  [facility];  Service: Plastics;  Laterality: Right;   APPLICATION OF WOUND VAC Right 12/14/2021   Procedure: APPLICATION OF WOUND VAC;  Surgeon: [name], MD;  [facility];  Service: Plastics;  Laterality: Right;   APPLICATION OF WOUND VAC Right 12/22/2021   Procedure: WOUND VAC CHANGE;  Surgeon: [name], MD;  [facility];  Service: Plastics;  Laterality: Right;   APPLICATION OF WOUND VAC Right 12/29/2021   Procedure: APPLICATION OF WOUND VAC;  Surgeon: [name], MD;  [facility];  Service: Plastics;  Laterality: Right;   BIOPSY  11/17/2021   Procedure: BIOPSY;  Surgeon: Clarene Essex, MD;  Location: [facility];  Service: Endoscopy;;   BUBBLE STUDY  09/30/2021   Procedure: BUBBLE STUDY;  Surgeon: Dixie Dials, MD;  Location: [facility];  Service: Cardiovascular;;    ESOPHAGOGASTRODUODENOSCOPY (EGD) WITH PROPOFOL N/A 11/17/2021   Procedure: ESOPHAGOGASTRODUODENOSCOPY (EGD) WITH PROPOFOL;  Surgeon: Clarene Essex, MD;  [facility];  Service: Endoscopy;  Laterality: N/A;   FRACTURE SURGERY     I & D EXTREMITY Right 09/27/2021   Procedure: IRRIGATION AND DEBRIDEMENT OF ABSCESS RIGHT HAND;  Surgeon: Dayna Barker, MD;  Location: [facility];  Service: Plastics;  Laterality: Right;   I & D EXTREMITY Bilateral 11/15/2021   Procedure: IRRIGATION AND DEBRIDEMENT EXTREMITY;  Surgeon: Dayna Barker, MD;  [facility];  Service: Plastics;  Laterality: Bilateral;   I & D EXTREMITY Right 11/17/2021   Procedure: IRRIGATION AND DEBRIDEMENT EXTREMITY;  Surgeon: Dayna Barker, MD;  [facility];  Service: Plastics;  Laterality: Right;   I & D EXTREMITY Bilateral 12/01/2021   Procedure: INCISION AND DRAINAGE BILATERAL UPPER EXTREMITIES WITH  WOUND VAC CHANGE RIGHT;  Surgeon: Orene Desanctis, MD;  [facility];  Service: Orthopedics;  Laterality: Bilateral;   I & D EXTREMITY Right 12/07/2021   Procedure: IRRIGATION AND DEBRIDEMENT EXTREMITY, MANIPULATION OF FINGERS UNDER ANESTHESIA;  Surgeon: Dayna Barker, MD;  [facility];  Service: Plastics;  Laterality: Right;   INCISION AND DRAINAGE OF WOUND Left 09/27/2021   Procedure: IRRIGATION AND DEBRIDEMENT OF ABSCESS LEFT INDEX FINGER;  Surgeon: Dayna Barker, MD;  Location: [facility];  Service: Plastics;  Laterality: Left;   INCISION AND DRAINAGE OF WOUND Bilateral 11/22/2021   Procedure: IRRIGATION AND DEBRIDEMENT WOUND;  Surgeon: Dayna Barker, MD;  [facility];  Service: Plastics;  Laterality: Bilateral;   INCISION AND DRAINAGE OF WOUND Bilateral 11/25/2021   Procedure: BILATERAL IRRIGATION AND DEBRIDEMENT BILATERAL ARMS;  Surgeon: Dayna Barker, MD;  [facility];  Service: Plastics;  Laterality: Bilateral;   INCISION AND DRAINAGE OF WOUND Right 12/14/2021   Procedure: IRRIGATION AND DEBRIDEMENT RIGHT  ARM;  Surgeon: [name], MD;  [facility];  Service: Plastics;  Laterality: Right;   INCISION AND DRAINAGE OF WOUND Right 12/22/2021   Procedure: IRRIGATION AND DEBRIDEMENT ARM  WOUND;  Surgeon: [name], MD;  [facility];  Service: Plastics;  Laterality: Right;  1 hour   INCISION AND DRAINAGE OF WOUND Right 12/29/2021   Procedure: DEBRIDEMENT RIGHT ARM  WOUND;  Surgeon: [name], MD;  [facility];  Service: Plastics;  Laterality: Right;   JOINT REPLACEMENT     SCLEROTHERAPY  11/17/2021   Procedure: Clide Deutscher;  Surgeon: Clarene Essex, MD;  [facility];  Service: Endoscopy;;   TEE WITHOUT CARDIOVERSION N/A 09/30/2021   Procedure: TRANSESOPHAGEAL ECHOCARDIOGRAM (TEE);  Surgeon: Dixie Dials, MD;  Location: [facility];  Service: Cardiovascular;  Laterality: N/A;   Family History: History reviewed. No pertinent family history. Family Psychiatric  History:   Social History:  Social History   Substance and Sexual Activity  Alcohol Use Not Currently   Comment: occasionally     Social History   Substance and Sexual Activity  Drug Use Yes   Types: IV   Comment: heroin, meth    Social History   Socioeconomic History   Marital status: Divorced    Spouse name: Not on file   Number of children: Not on file   Years of education: Not on file   Highest education level: Not on file  Occupational History   Not on file  Tobacco Use   Smoking status: Every Day    Packs/day: 0.50    Types: Cigarettes   Smokeless tobacco: Never  Substance and Sexual Activity   Alcohol use: Not Currently    Comment: occasionally   Drug use: Yes    Types: IV    Comment: heroin, meth   Sexual activity: Not on file  Other Topics Concern   Not on file  Social History Narrative   Not on file   Social Determinants of Health   Financial Resource Strain: Not on file  Food Insecurity: Not on file  Transportation Needs: Not on file  Physical Activity: Not  on file  Stress: Not on file  Social Connections: Not on file   Additional Social History:    Allergies:   Allergies  Allergen Reactions   Bee Venom Anaphylaxis and Swelling    Swelling all over   Succinylcholine Other (See Comments)    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 Had prolonged block (>30 min) after succinylcholine for GI procedure.     Labs:  Results for orders placed or performed during the hospital encounter of 11/14/21 (from the past 48 hour(s))  Basic metabolic panel     Status: Abnormal   Collection Time: 12/30/21  7:48 AM  Result Value Ref Range   Sodium 136 135 - 145 mmol/L   Potassium 3.7 3.5 - 5.1 mmol/L   Chloride 104 98 - 111 mmol/L   CO2 22 22 - 32 mmol/L   Glucose, Bld 191 (H) 70 - 99 mg/dL    Comment: Glucose reference range applies only to samples taken after fasting for at least 8 hours.   BUN 19 6 - 20 mg/dL   Creatinine, Ser 0.47 (L) 0.61 - 1.24 mg/dL   Calcium 8.4 (L) 8.9 - 10.3 mg/dL   GFR, Estimated >60 >60 mL/min    Comment: (NOTE) Calculated using the CKD-EPI Creatinine Equation (2021)    Anion gap 10 5 - 15    Comment: Performed at [facility], [place] [address] [place], [place] [address]  CBC     Status: Abnormal   Collection Time: 12/30/21  7:48 AM  Result Value Ref Range   WBC 18.8 (H) 4.0 - 10.5 K/uL   RBC 4.02 (L) 4.22 - 5.81 MIL/uL   Hemoglobin 10.7 (L) 13.0 - 17.0 g/dL   HCT 35.0 (L) 39.0 - 52.0 %   MCV 87.1 80.0 - 100.0 fL   MCH 26.6 26.0 - 34.0 pg   MCHC 30.6 30.0 - 36.0 g/dL   RDW 15.4 11.5 - 15.5 %   Platelets 429 (H) 150 - 400 K/uL   nRBC 0.0 0.0 - 0.2 %    Comment: Performed at [facility], [place] [address], [place], [place] [address]  Magnesium     Status: None   Collection Time: 12/30/21  7:48 AM  Result Value Ref Range   Magnesium 1.8 1.7 - 2.4 mg/dL    Comment: Performed at [facility], [place] [address], [place], [place] [address]    Current  Facility-Administered Medications  Medication Dose Route Frequency Provider Last Rate Last Admin   (feeding supplement) PROSource Plus liquid 30 mL  30 mL Oral TID BM Arrien, Jimmy Picket, MD   30 mL at 12/31/21 1032   acetaminophen (TYLENOL) tablet 500 mg  500 mg Oral Q6H Pokhrel, Laxman, MD   500 mg at 12/31/21 1038   ascorbic acid (VITAMIN  C) tablet 500 mg  500 mg Oral Daily Arrien, Jimmy Picket, MD   500 mg at 12/31/21 1038   Chlorhexidine Gluconate Cloth 2 % PADS 6 each  6 each Topical Daily Candee Furbish, MD   6 each at 12/31/21 1041   cyclobenzaprine (FLEXERIL) tablet 5 mg  5 mg Oral TID PRN Tawni Millers, MD   5 mg at 12/30/21 2106   feeding supplement (ENSURE ENLIVE / ENSURE PLUS) liquid 237 mL  237 mL Oral TID BM Pokhrel, Laxman, MD   237 mL at 12/31/21 1041   ferrous sulfate tablet 325 mg  325 mg Oral Q breakfast Arrien, Jimmy Picket, MD   325 mg at 12/31/21 [id number]   gabapentin (NEURONTIN) capsule 400 mg  400 mg Oral TID Pokhrel, [name] Mckusick, MD   400 mg at 12/31/21 1038   HYDROmorphone (DILAUDID) injection 2 mg  2 mg Intravenous Q3H PRN Pokhrel, Laxman, MD   2 mg at 12/31/21 1034   lactated ringers infusion   Intravenous Continuous Duane Boston, MD 20 mL/hr at 12/29/21 1328 New Bag at 12/29/21 1423   magnesium oxide (MAG-OX) tablet 400 mg  400 mg Oral BID Pokhrel, Laxman, MD   400 mg at 12/31/21 1039   nicotine (NICODERM CQ - dosed in mg/24 hours) patch 21 mg  21 mg Transdermal Daily Elodia Florence., MD   21 mg at 12/31/21 1039   nicotine polacrilex (NICORETTE) gum 2 mg  2 mg Oral PRN Elodia Florence., MD   2 mg at 12/31/21 1037   ondansetron (ZOFRAN) tablet 4 mg  4 mg Oral Q6H PRN Clarene Essex, MD       Or   ondansetron [facility]) injection 4 mg  4 mg Intravenous Q6H PRN Clarene Essex, MD   4 mg at 12/17/21 0317   oxyCODONE (ROXICODONE) 5 MG/5ML solution 15 mg  15 mg Oral Q3H PRN Pokhrel, Laxman, MD   15 mg at 12/31/21 0827   pantoprazole (PROTONIX) EC tablet 40  mg  40 mg Oral BID Tawni Millers, MD   40 mg at 12/31/21 1038   polyethylene glycol (MIRALAX / GLYCOLAX) packet 17 g  17 g Oral BID Elodia Florence., MD   17 g at 12/29/21 2138   QUEtiapine (SEROQUEL XR) 24 hr tablet 300 mg  300 mg Oral QHS Dessie Tatem, MD       sodium chloride flush (NS) 0.9 % injection 10-40 mL  10-40 mL Intracatheter PRN Arrien, Jimmy Picket, MD   10 mL at 11/26/21 0720   traZODone (DESYREL) tablet 100 mg  100 mg Oral QHS PRN Sherrilyn Rist A, MD   100 mg at 12/30/21 2106    Musculoskeletal: Strength & Muscle Tone: within normal limits Gait & Station: normal Patient leans: N/A    Psychiatric Specialty Exam:  Presentation  General Appearance: Appropriate for Environment  Eye Contact:Good  Speech:Clear and Coherent  Speech Volume:Normal  Handedness:Right   Mood and Affect  Mood:Irritable  Affect:Labile   Thought Process  Thought Processes:Coherent  Descriptions of Associations:Intact  Orientation:Full (Time, Place and Person)  Thought Content:Logical  History of Schizophrenia/Schizoaffective disorder:No data recorded Duration of Psychotic Symptoms:No data recorded Hallucinations:Hallucinations: None  Ideas of Reference:None  Suicidal Thoughts:Suicidal Thoughts: No  Homicidal Thoughts:Homicidal Thoughts: No   Sensorium  Memory:Immediate Good; Recent Good; Remote Good  Judgment:Fair  Insight:Shallow   Executive Functions  Concentration:Fair  Attention Span:Fair  [place]   Psychomotor Activity  Psychomotor Activity:Psychomotor Activity: Increased   Assets  Assets:Communication Skills   Sleep  Sleep:Sleep: Fair   Physical Exam: Physical Exam Review of Systems  Psychiatric/Behavioral:  Positive for substance abuse.   Blood pressure (!) 117/99, pulse (!) 107, temperature 98.8 F (37.1 C), temperature source Oral, resp. rate 16, height 5\' 7"  (1.702  m), weight 78.2 kg, SpO2 99 %. Body mass index is 27 kg/m.  Treatment Plan Summary: 36 year old male with long history of mental illness, polysubstance dependence who was admitted with arm pain/cellulitis secondary to IV drug use. Patient is currently endorsing mood swings but denies psychosis, delusions and self harming thought.  Recommendations: -Change and increase Seroquel 100 mg twice daily to Seroquel XL 300 mg at bedtime for mood stabilization -Consider social worker consult for psycho social assessment and referral for outpatient drug rehabilitation after patient is medically cleared and discharged.  Disposition: No evidence of imminent risk to self or others at present.   Supportive therapy provided about ongoing stressors. Psychiatric consultation service signing out. Re-consult as needed  Corena Pilgrim, MD 12/31/2021 1:30 PM

## 2021-12-31 NOTE — Progress Notes (Signed)
PROGRESS NOTE    Jason Buchanan  [id number] DOB: 1986-01-20 DOA: 11/14/2021 PCP: [name], NP   Brief Narrative:   36 year old male with medical history significant for polysubstance abuse including intravenous drug abuse presented to hospital with bilateral arm pain from injection to arms bilaterally.  CT scan of the upper extremity showed extensive cellulitis and was initially started on broad-spectrum antibiotic.  After hospitalization, patient has undergone multiple bilateral upper extremity I&D's by hand surgery and plastic surgery.  Blood culture was also positive for beta Streptococcus.  On 11/07/2021, patient developed melena with active GI bleed requiring PRBC transfusion.  GI was consulted and patient underwent EGD confirming mild proximal mild esophagitis with nonbleeding ulcers, nonbleeding duodenal ulcers which were injected.  Post procedure, he developed respiratory failure requiring intubation and mechanical ventilation and was subsequently extubated on 11/17/2021.  Patient was subsequently considered stable for transfer out of the ICU and is currently in the hospitalist service.  Patient has been having multiple surgical intervention during hospitalization.  Assessment & Plan:  Sepsis due to bilateral upper extremity cellulitis/streptococcal bacteremia Patient has completed course of antibiotic as per ID.  Patient was on daptomycin for a prolonged duration. Currently on Dilaudid,Tylenol oxycodone and gabapentin for pain management.  Patient is afebrile, no leukocytosis.  Patient continues to require IV Dilaudid for adequate control of pain.  Patient has had several I&D's during hospitalization. Patient underwent further debridement on 12/29/21 with placement of wound VAC and preparation for right arm graft.     Polysubstance abuse  History of IV drug abuse.  Patient wishes to undergo drug treatment program after discharge.     Acute blood loss anemia due to upper GI  bleed. Will continue to hold Toradol and NSAIDs.  Continue to monitor symptoms.   Latest hemoglobin of 10.7. EGD done on 11/17/2021 showed mild esophagitis and nonbleeding gastric ulcer.  Status post 2 units of packed RBCs during hospitalization..  On iron sulfate.     Postoperative respiratory failure. Resolved at this time.  Initially required intubation.    Reactive thrombocytosis.  Continue to monitor.     Amphetamine and psychostimulant-induced psychosis with hallucinations  Continue Seroquel and trazodone.  Telemetry sitter due to concern for illicit substance use during hospitalization patient states that she feels more depressed and anxious this time and wishes to speak to a psychiatrist.     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   Disposition Plan: home when okay with surgery.  DVT prophylaxis: SCD  Code Status: Full code  Family Communication:   Spoke with the patient at bedside.  Consultants:  Hand surgery Plastic surgery Orthopedic surgery PCCM Wound Care Psychiatry GI  Procedures:  12/08/21- I&D and debridement of LUE and L hand 12/07/21- Wound vac change of RUE, I&D of deep abscess of  R upper arm 12/03/21- I&D 12/01/21- I&D Incision and drainage and debridement right upper extremity/incision and drainage of left upper extremity by Dr. Lenon Curt hand surgeon 11/23/2021. Wound evaluation left upper extremity with dressing change under sedation/incision and drainage right upper extremity and loose closure of wound, right upper extremity per Dr. Lenon Curt, hand surgeon 11/18/2021 Right hand fasciotomy/right hand carpal tunnel release/right hand renal scan no release/right forearm 3 compartment fasciotomy/thorough irrigation of hand and forearm/dorsal hand incision and drainage including extensor tendon sheath per Dr. Lenon Curt, hand surgeon 11/17/2021 Central line  insertion per PCCM, Dr. Tamala Julian 11/17/2021 Upper endoscopy per  GI, Dr. Watt Climes 11/17/2021 Left wrist deep abscess incision and drainage/left volar forearm fasciotomy/left wrist carpal tunnel release extended with Guyon's canal release--per Dr. Greta Doom orthopedics 11/15/2021  12/20 and 12/23: Status post incision and drainage of right and left upper extremity 01/04 wound vac change of the right upper extremity, I&D of deep abscess of the right upper arm.  1/11 Debridement with preparation of skin graft 21x8 cm and 8x2 right arm, Application of wound vac dressing right arm.  01/19 debridement with preparation of skin graft right arm 21x6 and 8,2 right upper arm. Placement of wound vac  1/27 and with preparation for skin graft.  Antimicrobials:  None    Subjective: Today, patient was seen and examined at bedside.  Complains of mild pain.  Denies any fever chills nausea vomiting or abdominal pain.    Objective: Vitals:   12/30/21 0456 12/30/21 1318 12/30/21 1518 12/30/21 2120  BP: 120/67 132/66 (!) 101/55 (!) 117/99  Pulse: (!) 108 (!) 112 (!) 110 (!) 107  Resp: 20 16 20 16   Temp: 97.6 F (36.4 C) 98.1 F (36.7 C) 98 F (36.7 C) 98.8 F (37.1 C)  TempSrc: Oral Oral Oral Oral  SpO2: 95% 98% 100% 99%  Weight:      Height:        Intake/Output Summary (Last 24 hours) at 12/31/2021 1205 Last data filed at 12/31/2021 0900 Gross per 24 hour  Intake 2012 ml  Output 3300 ml  Net -1288 ml    Filed Weights   11/15/21 1608 11/25/21 1342 12/29/21 1000  Weight: 60 kg 60 kg 78.2 kg   Physical examination  General:  Average built, not in obvious distress HENT:   No scleral pallor or icterus noted. Oral mucosa is moist.  Chest:  Clear breath sounds.  Diminished breath sounds bilaterally. No crackles or wheezes.  CVS: S1 &S2 heard. No murmur.  Regular rate and rhythm. Abdomen: Soft, nontender, nondistended.  Bowel sounds are heard.   Extremities: Bilateral upper extremity status post  surgery, left wrist on Ace bandage, distal capillary refill present.Marland Kitchen Psych: Alert, awake and oriented, normal mood CNS:  No cranial nerve deficits.  Power equal in all extremities.   Skin: Warm and dry.  Right upper extremity with wound VAC, left upper extremity with Ace bandage.   Data Reviewed: I have personally reviewed following labs and imaging studies  CBC: Recent Labs  Lab 12/26/21 0506 12/30/21 0748  WBC 10.0 18.8*  HGB 11.4* 10.7*  HCT 37.8* 35.0*  MCV 87.3 87.1  PLT 431* 429*    Basic Metabolic Panel: Recent Labs  Lab 12/26/21 0506 12/30/21 0748  NA 134* 136  K 3.9 3.7  CL 101 104  CO2 24 22  GLUCOSE 122* 191*  BUN 19 19  CREATININE 0.44* 0.47*  CALCIUM 9.0 8.4*  MG  --  1.8    GFR: Estimated Creatinine Clearance: 120.5 mL/min (A) (by C-G formula based on SCr of 0.47 mg/dL (L)). Liver Function Tests: No results for input(s): AST, ALT, ALKPHOS, BILITOT, PROT, ALBUMIN in the last 168 hours. No results for input(s): LIPASE, AMYLASE in the last 168 hours. No results for input(s): AMMONIA in the last 168 hours. Coagulation Profile: No results for input(s): INR, PROTIME in the last 168 hours. Cardiac Enzymes: No results for input(s): CKTOTAL, CKMB, CKMBINDEX, TROPONINI in the last 168 hours.  BNP (last 3 results) No results for input(s): PROBNP in the last 8760 hours. HbA1C: No results for input(s): HGBA1C in the last  72 hours. CBG: No results for input(s): GLUCAP in the last 168 hours. Lipid Profile: No results for input(s): CHOL, HDL, LDLCALC, TRIG, CHOLHDL, LDLDIRECT in the last 72 hours. Thyroid Function Tests: No results for input(s): TSH, T4TOTAL, FREET4, T3FREE, THYROIDAB in the last 72 hours. Anemia Panel: No results for input(s): VITAMINB12, FOLATE, FERRITIN, TIBC, IRON, RETICCTPCT in the last 72 hours. Sepsis Labs: No results for input(s): PROCALCITON, LATICACIDVEN in the last 168 hours.  No results found for this or any previous visit  (from the past 240 hour(s)).    Radiology Studies: No results found.  Scheduled Meds:  (feeding supplement) PROSource Plus  30 mL Oral TID BM   acetaminophen  500 mg Oral Q6H   vitamin C  500 mg Oral Daily   Chlorhexidine Gluconate Cloth  6 each Topical Daily   feeding supplement  237 mL Oral TID BM   ferrous sulfate  325 mg Oral Q breakfast   gabapentin  400 mg Oral TID   magnesium oxide  400 mg Oral BID   nicotine  21 mg Transdermal Daily   pantoprazole  40 mg Oral BID   polyethylene glycol  17 g Oral BID   QUEtiapine  100 mg Oral BID   Continuous Infusions:  lactated ringers 20 mL/hr at 12/29/21 1328     LOS: 60 days   Flora Lipps, MD  [facility] If 7PM-7AM, please contact night-coverage [email or web address] 12/31/2021, 12:05 PM

## 2022-01-01 DIAGNOSIS — F15951 Other stimulant use, unspecified with stimulant-induced psychotic disorder with hallucinations: Secondary | ICD-10-CM | POA: Diagnosis not present

## 2022-01-01 DIAGNOSIS — L03113 Cellulitis of right upper limb: Secondary | ICD-10-CM | POA: Diagnosis not present

## 2022-01-01 DIAGNOSIS — D62 Acute posthemorrhagic anemia: Secondary | ICD-10-CM | POA: Diagnosis not present

## 2022-01-01 DIAGNOSIS — L0390 Cellulitis, unspecified: Secondary | ICD-10-CM | POA: Diagnosis not present

## 2022-01-01 LAB — CBC
HCT: 37.3 % — ABNORMAL LOW (ref 39.0–52.0)
Hemoglobin: 11.3 g/dL — ABNORMAL LOW (ref 13.0–17.0)
MCH: 26.7 pg (ref 26.0–34.0)
MCHC: 30.3 g/dL (ref 30.0–36.0)
MCV: 88 fL (ref 80.0–100.0)
Platelets: 470 10*3/uL — ABNORMAL HIGH (ref 150–400)
RBC: 4.24 MIL/uL (ref 4.22–5.81)
RDW: 15.4 % (ref 11.5–15.5)
WBC: 10.2 10*3/uL (ref 4.0–10.5)
nRBC: 0 % (ref 0.0–0.2)

## 2022-01-01 NOTE — Plan of Care (Signed)
  Problem: Education: Goal: Knowledge of General Education information will improve Description Including pain rating scale, medication(s)/side effects and non-pharmacologic comfort measures Outcome: Progressing   

## 2022-01-01 NOTE — Progress Notes (Signed)
PROGRESS NOTE    Jason Buchanan  [id number] DOB: 01/13/1986 DOA: 11/14/2021 PCP: [name], NP   Brief Narrative:   36 year old male with medical history significant for polysubstance abuse including intravenous drug abuse presented to hospital with bilateral arm pain from injection to arms bilaterally.  CT scan of the upper extremity showed extensive cellulitis and was initially started on broad-spectrum antibiotic.  After hospitalization, patient has undergone multiple bilateral upper extremity I&D's by hand surgery and plastic surgery.  Blood culture was also positive for beta Streptococcus.  On 11/07/2021, patient developed melena with active GI bleed requiring PRBC transfusion.  GI was consulted and patient underwent EGD confirming mild proximal mild esophagitis with nonbleeding ulcers, nonbleeding duodenal ulcers which were injected.  Post procedure, he developed respiratory failure requiring intubation and mechanical ventilation and was subsequently extubated on 11/17/2021.  Patient was subsequently considered stable for transfer out of the ICU and is currently in the hospitalist service.  Patient has been having multiple surgical intervention during hospitalization.  Assessment & Plan:  Sepsis due to bilateral upper extremity cellulitis/streptococcal bacteremia Patient has completed course of antibiotic as per ID.  Patient was on daptomycin for a prolonged duration. Currently on Dilaudid,Tylenol oxycodone and gabapentin for pain management.  Patient is afebrile, no leukocytosis.  Patient continues to require IV Dilaudid for adequate control of pain.  Patient has had several I&D's during hospitalization. Patient underwent further debridement on 12/29/21 with placement of wound VAC and preparation for right arm graft.  We will follow-up plastic surgery recommendations.    Polysubstance abuse  History of IV drug abuse.  Patient wishes to undergo drug treatment program after discharge.   By psychiatry during hospitalization and as been changed to Seroquel 300 mg at bedtime.    Acute blood loss anemia due to upper GI bleed. EGD done on 11/17/2021 showed mild esophagitis and nonbleeding gastric ulcer.  Status post 2 units of packed RBCs during hospitalization..  On iron sulfate.  We will try to avoid NSAIDs.  Latest hemoglobin of 11.3.   Postoperative respiratory failure. Resolved at this time.  Initially required intubation.    Reactive thrombocytosis.  Continue to monitor.     Amphetamine and psychostimulant-induced psychosis with hallucinations  Continue Seroquel and trazodone.  Telemetry sitter due to concern for illicit substance use during hospitalization seen by psychiatrist since he expressed more depression.  Seroquel dose has been increased to 300 mg at bedtime at this time      Hepatitis C 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 Follow-up with hepatology as outpatient     Protein-calorie malnutrition, severe Continue nutritional support.   Disposition Plan: home when okay with surgery.  DVT prophylaxis: SCD  Code Status: Full code  Family Communication:   Spoke with the patient at bedside.  Consultants:  Hand surgery Plastic surgery Orthopedic surgery PCCM Wound Care Psychiatry GI  Procedures:  12/08/21- I&D and debridement of LUE and L hand 12/07/21- Wound vac change of RUE, I&D of deep abscess of  R upper arm 12/03/21- I&D 12/01/21- I&D Incision and drainage and debridement right upper extremity/incision and drainage of left upper extremity by Dr. Lenon Curt hand surgeon 11/23/2021. Wound evaluation left upper extremity with dressing change under sedation/incision and drainage right upper extremity and loose closure of wound, right upper extremity per Dr. Lenon Curt, hand surgeon 11/18/2021 Right hand fasciotomy/right hand carpal tunnel release/right hand renal scan no release/right forearm 3 compartment fasciotomy/thorough irrigation of hand and forearm/dorsal hand incision  and drainage including extensor tendon sheath  per Dr. Lenon Curt, hand surgeon 11/17/2021 Central line insertion per PCCM, Dr. Tamala Julian 11/17/2021 Upper endoscopy per GI, Dr. Watt Climes 11/17/2021 Left wrist deep abscess incision and drainage/left volar forearm fasciotomy/left wrist carpal tunnel release extended with Guyon's canal release--per Dr. Greta Doom orthopedics 11/15/2021  12/20 and 12/23: Status post incision and drainage of right and left upper extremity 01/04 wound vac change of the right upper extremity, I&D of deep abscess of the right upper arm.  1/11 Debridement with preparation of skin graft 21x8 cm and 8x2 right arm, Application of wound vac dressing right arm.  01/19 debridement with preparation of skin graft right arm 21x6 and 8,2 right upper arm. Placement of wound vac  12/30/21 debridement with preparation for skin graft.  Antimicrobials:  None    Subjective: Today, patient was seen and examined at bedside.  Still complains of mild pain.  Denies any nausea vomiting or fever.   Objective: Vitals:   12/30/21 2120 12/31/21 1333 12/31/21 2007 01/01/22 0529  BP: (!) 117/99 138/89 (!) 161/76 122/67  Pulse: (!) 107 (!) 103 97 90  Resp: 16 20 (!) 24 20  Temp: 98.8 F (37.1 C) 97.8 F (36.6 C) (!) 97.5 F (36.4 C) 97.6 F (36.4 C)  TempSrc: Oral Oral Oral Oral  SpO2: 99% 99% 100% 98%  Weight:      Height:        Intake/Output Summary (Last 24 hours) at 01/01/2022 0918 Last data filed at 01/01/2022 0830 Gross per 24 hour  Intake 480 ml  Output 3550 ml  Net -3070 ml    Filed Weights   11/15/21 1608 11/25/21 1342 12/29/21 1000  Weight: 60 kg 60 kg 78.2 kg   Physical examination  General:  Average built, not in obvious distress alert awake and communicative. HENT:   No scleral pallor or icterus noted. Oral mucosa is moist.  Chest:   Diminished breath sounds bilaterally. No crackles or wheezes.  CVS: S1 &S2 heard. No murmur.  Regular rate and rhythm. Abdomen: Soft,  nontender, nondistended.  Bowel sounds are heard.   Extremities: No cyanosis, clubbing or edema of the lower extremities..  Right upper extremity with wound VAC and dressing.  Left wrist with Ace bandage. Psych: Alert, awake and oriented, mildly anxious. CNS:  No cranial nerve deficits.  Power equal in all extremities.   Skin: Warm and dry.    Data Reviewed: I have personally reviewed following labs and imaging studies  CBC: Recent Labs  Lab 12/26/21 0506 12/30/21 0748 01/01/22 0528  WBC 10.0 18.8* 10.2  HGB 11.4* 10.7* 11.3*  HCT 37.8* 35.0* 37.3*  MCV 87.3 87.1 88.0  PLT 431* 429* 470*    Basic Metabolic Panel: Recent Labs  Lab 12/26/21 0506 12/30/21 0748  NA 134* 136  K 3.9 3.7  CL 101 104  CO2 24 22  GLUCOSE 122* 191*  BUN 19 19  CREATININE 0.44* 0.47*  CALCIUM 9.0 8.4*  MG  --  1.8    GFR: Estimated Creatinine Clearance: 120.5 mL/min (A) (by C-G formula based on SCr of 0.47 mg/dL (L)). Liver Function Tests: No results for input(s): AST, ALT, ALKPHOS, BILITOT, PROT, ALBUMIN in the last 168 hours. No results for input(s): LIPASE, AMYLASE in the last 168 hours. No results for input(s): AMMONIA in the last 168 hours. Coagulation Profile: No results for input(s): INR, PROTIME in the last 168 hours. Cardiac Enzymes: No results for input(s): CKTOTAL, CKMB, CKMBINDEX, TROPONINI in the last 168 hours.  BNP (last 3 results)  No results for input(s): PROBNP in the last 8760 hours. HbA1C: No results for input(s): HGBA1C in the last 72 hours. CBG: No results for input(s): GLUCAP in the last 168 hours. Lipid Profile: No results for input(s): CHOL, HDL, LDLCALC, TRIG, CHOLHDL, LDLDIRECT in the last 72 hours. Thyroid Function Tests: No results for input(s): TSH, T4TOTAL, FREET4, T3FREE, THYROIDAB in the last 72 hours. Anemia Panel: No results for input(s): VITAMINB12, FOLATE, FERRITIN, TIBC, IRON, RETICCTPCT in the last 72 hours. Sepsis Labs: No results for input(s):  PROCALCITON, LATICACIDVEN in the last 168 hours.  No results found for this or any previous visit (from the past 240 hour(s)).    Radiology Studies: No results found.  Scheduled Meds:  (feeding supplement) PROSource Plus  30 mL Oral TID BM   acetaminophen  500 mg Oral Q6H   vitamin C  500 mg Oral Daily   Chlorhexidine Gluconate Cloth  6 each Topical Daily   feeding supplement  237 mL Oral TID BM   ferrous sulfate  325 mg Oral Q breakfast   gabapentin  400 mg Oral TID   magnesium oxide  400 mg Oral BID   nicotine  21 mg Transdermal Daily   pantoprazole  40 mg Oral BID   polyethylene glycol  17 g Oral BID   QUEtiapine  300 mg Oral QHS   Continuous Infusions:  lactated ringers 20 mL/hr at 12/31/21 1854     LOS: 48 days   Flora Lipps, MD  [facility] If 7PM-7AM, please contact night-coverage [email or web address] 01/01/2022, 9:18 AM

## 2022-01-02 DIAGNOSIS — D62 Acute posthemorrhagic anemia: Secondary | ICD-10-CM | POA: Diagnosis not present

## 2022-01-02 DIAGNOSIS — F15951 Other stimulant use, unspecified with stimulant-induced psychotic disorder with hallucinations: Secondary | ICD-10-CM | POA: Diagnosis not present

## 2022-01-02 DIAGNOSIS — L0390 Cellulitis, unspecified: Secondary | ICD-10-CM | POA: Diagnosis not present

## 2022-01-02 DIAGNOSIS — L03113 Cellulitis of right upper limb: Secondary | ICD-10-CM | POA: Diagnosis not present

## 2022-01-02 NOTE — Progress Notes (Addendum)
PROGRESS NOTE    Jason Buchanan  [id number] DOB: [birth year] DOA: 11/14/2021 PCP: [name], NP   Brief Narrative:   36 year old male with medical history significant for polysubstance abuse including intravenous drug abuse presented to hospital with bilateral arm pain from injection to arms bilaterally.  CT scan of the upper extremity showed extensive cellulitis and was initially started on broad-spectrum antibiotic.  After hospitalization, patient has undergone multiple bilateral upper extremity I&D's by hand surgery and plastic surgery.  Blood culture was also positive for beta Streptococcus.  On 11/07/2021, patient developed melena with active GI bleed requiring PRBC transfusion.  GI was consulted and patient underwent EGD confirming mild proximal mild esophagitis with nonbleeding ulcers, nonbleeding duodenal ulcers which were injected.  Post procedure, he developed respiratory failure requiring intubation and mechanical ventilation and was subsequently extubated on 11/17/2021.  Patient was subsequently considered stable for transfer out of the ICU and is currently in the hospitalist service.  Patient has been having multiple surgical intervention during hospitalization.  Assessment & Plan:  Sepsis due to bilateral upper extremity cellulitis/streptococcal bacteremia Patient has completed course of antibiotic as per ID.  Patient was on daptomycin for a prolonged duration. Currently on Dilaudid,Tylenol oxycodone and gabapentin for pain management.  Patient is afebrile, no leukocytosis.  Patient continues to require IV Dilaudid for adequate control of pain.  Patient has had several I&D's during hospitalization. Patient underwent further debridement on 12/29/21 with placement of wound VAC and preparation for right arm graft.  Communicated with Dr. Yehuda Mao plastic surgery who is thinking about possible graft surgery on 01/03/2022.    Polysubstance abuse  History of IV drug abuse.  Patient  wishes to undergo drug treatment program after discharge.  Patient was by psychiatry during hospitalization and as been changed to Seroquel 300 mg at bedtime.    Acute blood loss anemia due to upper GI bleed. EGD done on 11/17/2021 showed mild esophagitis and nonbleeding gastric ulcer.  Status post 2 units of packed RBCs during hospitalization..  On iron sulfate.  We will try to avoid NSAIDs.  Latest hemoglobin of 11.3.   Postoperative respiratory failure. Resolved at this time.  Initially required intubation.    Reactive thrombocytosis.  Continue to monitor.     Amphetamine and psychostimulant-induced psychosis with hallucinations  Continue Seroquel and trazodone.  Telemetry sitter due to concern for illicit substance use during hospitalization .  Seen by psychiatry during hospitalization Seroquel dose has been increased to 300 mg at bedtime at this time      Hepatitis C 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 Follow-up with hepatology as outpatient     Protein-calorie malnutrition, severe Continue nutritional support.   Disposition Plan: home when okay with surgery.  Plans for further surgical intervention as per plastic surgery.  DVT prophylaxis: SCD  Code Status: Full code  Family Communication:   Spoke with the patient at bedside.  Consultants:  Hand surgery Plastic surgery Orthopedic surgery PCCM Wound Care Psychiatry GI  Procedures:  12/08/21- I&D and debridement of LUE and L hand 12/07/21- Wound vac change of RUE, I&D of deep abscess of  R upper arm 12/03/21- I&D 12/01/21- I&D Incision and drainage and debridement right upper extremity/incision and drainage of left upper extremity by Dr. Lenon Curt hand surgeon 11/23/2021. Wound evaluation left upper extremity with dressing change under sedation/incision and drainage right upper extremity and loose closure of wound, right upper extremity per Dr. Lenon Curt, hand surgeon 11/18/2021 Right hand fasciotomy/right hand carpal tunnel release/right hand  renal scan  no release/right forearm 3 compartment fasciotomy/thorough irrigation of hand and forearm/dorsal hand incision and drainage including extensor tendon sheath per Dr. Lenon Curt, hand surgeon 11/17/2021 Central line insertion per PCCM, Dr. Tamala Julian 11/17/2021 Upper endoscopy per GI, Dr. Watt Climes 11/17/2021 Left wrist deep abscess incision and drainage/left volar forearm fasciotomy/left wrist carpal tunnel release extended with Guyon's canal release--per Dr. Greta Doom orthopedics 11/15/2021  12/20 and 12/23: Status post incision and drainage of right and left upper extremity 01/04 wound vac change of the right upper extremity, I&D of deep abscess of the right upper arm.  1/11 Debridement with preparation of skin graft 21x8 cm and 8x2 right arm, Application of wound vac dressing right arm.  01/19 debridement with preparation of skin graft right arm 21x6 and 8,2 right upper arm. Placement of wound vac  12/30/21 debridement with preparation for skin graft.  Antimicrobials:  None    Subjective: Today, patient was seen and examined observer still comes over pain over the right mostly.  Denies any nausea vomiting fever chills or shortness of breath  Objective: Vitals:   01/01/22 0529 01/01/22 1414 01/01/22 2026 01/02/22 0342  BP: 122/67 (!) 155/85 (!) 154/82 121/90  Pulse: 90 (!) 102 96 (!) 106  Resp: 20 16 20 20   Temp: 97.6 F (36.4 C) 98.1 F (36.7 C) 98.1 F (36.7 C) 98.1 F (36.7 C)  TempSrc: Oral Oral Oral Oral  SpO2: 98% 99% 99% 97%  Weight:      Height:        Intake/Output Summary (Last 24 hours) at 01/02/2022 1024 Last data filed at 01/02/2022 [id number] Gross per 24 hour  Intake 2503 ml  Output 4800 ml  Net -2297 ml    Filed Weights   11/15/21 1608 11/25/21 1342 12/29/21 1000  Weight: 60 kg 60 kg 78.2 kg   Physical examination  General:  Average built, not in obvious distress HENT:   No scleral pallor or icterus noted. Oral mucosa is moist.  Chest:  Clear breath sounds.   Diminished breath sounds bilaterally. No crackles or wheezes.  CVS: S1 &S2 heard. No murmur.  Regular rate and rhythm. Abdomen: Soft, nontender, nondistended.  Bowel sounds are heard.   Extremities: No cyanosis, clubbing or edema of the lower extremities.  Right upper extremity with wound VAC.  Left upper extremity on Ace wrap..  Peripheral pulses are palpable. Psych: Alert, awake and oriented, appears mildly anxious CNS:  No cranial nerve deficits.  Power equal in all extremities.   Skin: Warm and dry.  Bilateral upper extremity wounds   Data Reviewed: I have personally reviewed following labs and imaging studies  CBC: Recent Labs  Lab 12/30/21 0748 01/01/22 0528  WBC 18.8* 10.2  HGB 10.7* 11.3*  HCT 35.0* 37.3*  MCV 87.1 88.0  PLT 429* 470*    Basic Metabolic Panel: Recent Labs  Lab 12/30/21 0748  NA 136  K 3.7  CL 104  CO2 22  GLUCOSE 191*  BUN 19  CREATININE 0.47*  CALCIUM 8.4*  MG 1.8    GFR: Estimated Creatinine Clearance: 120.5 mL/min (A) (by C-G formula based on SCr of 0.47 mg/dL (L)). Liver Function Tests: No results for input(s): AST, ALT, ALKPHOS, BILITOT, PROT, ALBUMIN in the last 168 hours. No results for input(s): LIPASE, AMYLASE in the last 168 hours. No results for input(s): AMMONIA in the last 168 hours. Coagulation Profile: No results for input(s): INR, PROTIME in the last 168 hours. Cardiac Enzymes: No results for input(s): CKTOTAL, CKMB, CKMBINDEX, TROPONINI in  the last 168 hours.  BNP (last 3 results) No results for input(s): PROBNP in the last 8760 hours. HbA1C: No results for input(s): HGBA1C in the last 72 hours. CBG: No results for input(s): GLUCAP in the last 168 hours. Lipid Profile: No results for input(s): CHOL, HDL, LDLCALC, TRIG, CHOLHDL, LDLDIRECT in the last 72 hours. Thyroid Function Tests: No results for input(s): TSH, T4TOTAL, FREET4, T3FREE, THYROIDAB in the last 72 hours. Anemia Panel: No results for input(s):  VITAMINB12, FOLATE, FERRITIN, TIBC, IRON, RETICCTPCT in the last 72 hours. Sepsis Labs: No results for input(s): PROCALCITON, LATICACIDVEN in the last 168 hours.  No results found for this or any previous visit (from the past 240 hour(s)).    Radiology Studies: No results found.  Scheduled Meds:  (feeding supplement) PROSource Plus  30 mL Oral TID BM   acetaminophen  500 mg Oral Q6H   vitamin C  500 mg Oral Daily   Chlorhexidine Gluconate Cloth  6 each Topical Daily   feeding supplement  237 mL Oral TID BM   ferrous sulfate  325 mg Oral Q breakfast   gabapentin  400 mg Oral TID   magnesium oxide  400 mg Oral BID   nicotine  21 mg Transdermal Daily   pantoprazole  40 mg Oral BID   polyethylene glycol  17 g Oral BID   QUEtiapine  300 mg Oral QHS   Continuous Infusions:  lactated ringers 20 mL/hr at 12/31/21 1854     LOS: 35 days   Flora Lipps, MD  [facility] If 7PM-7AM, please contact night-coverage [email or web address] 01/02/2022, 10:24 AM

## 2022-01-02 NOTE — Progress Notes (Signed)
Occupational Therapy Treatment Patient Details Name: Jason Buchanan MRN: [id number] DOB: 1986-05-19 Today's Date: 01/02/2022   History of present illness Pt is 36 yo male presented on 12/12 with bil UE edema/erythema after IV drug use.  Pt required R hand fasciotomy, carpal tunnel release, 3 compartment forearm fasciotomy, Guyon's canal release , and I and D hand/forearm on 12/15; and L wrist I and D, left volar forearm fasciotmoy, and L wrist carpal tunnel release on 12/13. On 12/14 concern for GIB and required 3 untis PRBC with ED 12/15.  Pt with hx of polysubstance abuse.   OT comments  Patient participated in ROM of wrists and digits with patient noted to have slightly improved AROM after PROM/AAROM to R wrist. Patient continues to report increased pain with ROM. Patient was educated on importance of BUE elevation to reduce edema and improve ROM. Patient verbalized understanding. Patient was positioned with pillows on this date. Patient's discharge plan remains appropriate at this time. OT will continue to follow acutely.     Recommendations for follow up therapy are one component of a multi-disciplinary discharge planning process, led by the attending physician.  Recommendations may be updated based on patient status, additional functional criteria and insurance authorization.    Follow Up Recommendations  Outpatient OT    Assistance Recommended at Discharge PRN  Patient can return home with the following  Assistance with cooking/housework;A lot of help with bathing/dressing/bathroom   Equipment Recommendations  None recommended by OT    Recommendations for Other Services      Precautions / Restrictions Precautions Precautions: Fall Precaution Comments: wound vac RUE Restrictions Weight Bearing Restrictions: No Other Position/Activity Restrictions: no restrictions on BUE per Charma Igo PA on secure chat 11/21/21       Mobility Bed Mobility                     Transfers                         Balance                                           ADL either performed or assessed with clinical judgement   ADL                                              Extremity/Trunk Assessment Upper Extremity Assessment RUE Deficits / Details: wound vac on R forearm in place, patients grasp is weak with ability to complete very gross grasp with inability to complete full fist. RUE Sensation: decreased light touch RUE Coordination: decreased fine motor;decreased gross motor LUE Deficits / Details: MCPs are noted to have redness and edema in this area with inability to preform majority of range of motion. LUE Sensation: decreased light touch LUE Coordination: decreased fine motor;decreased gross motor            Vision       Perception     Praxis      Cognition Arousal/Alertness: Awake/alert Behavior During Therapy: WFL for tasks assessed/performed Overall Cognitive Status: Within Functional Limits for tasks assessed  Exercises Other Exercises Other Exercises: patient was able to tolerate more neutral wrist postiioning on this date for AAROM of digits..noted to have deficits in middle digit and ring finger with attempts at neutral positionig with wrist in neutral. patient continues to have what appears to be tendon shortening when wrist is extended with inability to AROM digits. Other Exercises: passive ROM was provided with wrist, digits at each joint with increased time. Other Exercises: L hand was noted to have ACE wrap in place limiting attempts at wrist ROM. patient was able to tolerate PROM of digits with edema limiting ROM at MCPs. patient was educated on importance of arm elevation to reduce edema. Other Exercises: after PROM was preformed on R wrsit patient was noted to have increased ability to range wrist with about 5 degrees of  extension noted from prior to ROM.    Shoulder Instructions       General Comments      Pertinent Vitals/ Pain       Pain Assessment Pain Assessment: Faces Faces Pain Scale: Hurts even more Pain Location: with PROM/stretching of fingers, wrist bilaterally Pain Descriptors / Indicators: Discomfort, Grimacing, Guarding, Restless, Moaning Pain Intervention(s): Limited activity within patient's tolerance, Premedicated before session  Home Living                                          Prior Functioning/Environment              Frequency  Min 1X/week        Progress Toward Goals  OT Goals(current goals can now be found in the care plan section)  Progress towards OT goals: Progressing toward goals  Acute Rehab OT Goals Patient Stated Goal: to improve grasp OT Goal Formulation: With patient Time For Goal Achievement: 01/13/22 Potential to Achieve Goals: Good  Plan Discharge plan remains appropriate    Co-evaluation                 AM-PAC OT "6 Clicks" Daily Activity     Outcome Measure   Help from another person eating meals?: None Help from another person taking care of personal grooming?: None Help from another person toileting, which includes using toliet, bedpan, or urinal?: A Little Help from another person bathing (including washing, rinsing, drying)?: A Little Help from another person to put on and taking off regular upper body clothing?: None Help from another person to put on and taking off regular lower body clothing?: A Little 6 Click Score: 21    End of Session    OT Visit Diagnosis: Muscle weakness (generalized) (M62.81);Pain   Activity Tolerance Patient tolerated treatment well   Patient Left in bed;with call bell/phone within reach   Nurse Communication Patient requests pain meds        Time: 6314-9702 OT Time Calculation (min): 34 min  Charges: OT General Charges $OT Visit: 1 Visit OT  Treatments $Therapeutic Exercise: 23-37 mins  Sharyn Blitz OTR/L, MS Acute Rehabilitation Department Office# [phone] Pager# [phone]   Ardyth Harps 01/02/2022, 3:39 PM

## 2022-01-03 ENCOUNTER — Inpatient Hospital Stay (HOSPITAL_COMMUNITY): Payer: 59 | Admitting: Anesthesiology

## 2022-01-03 ENCOUNTER — Encounter (HOSPITAL_COMMUNITY)
Admission: EM | Disposition: A | Discharge status: Home / Self Care | Payer: Self-pay | Source: Home / Self Care | Attending: Internal Medicine

## 2022-01-03 DIAGNOSIS — S41101A Unspecified open wound of right upper arm, initial encounter: Secondary | ICD-10-CM

## 2022-01-03 DIAGNOSIS — L03113 Cellulitis of right upper limb: Secondary | ICD-10-CM

## 2022-01-03 HISTORY — PX: APPLICATION OF WOUND VAC: SHX5189

## 2022-01-03 HISTORY — PX: DEBRIDEMENT AND CLOSURE WOUND: SHX5614

## 2022-01-03 HISTORY — PX: SKIN SPLIT GRAFT: SHX444

## 2022-01-03 LAB — CBC
HCT: 36.2 % — ABNORMAL LOW (ref 39.0–52.0)
Hemoglobin: 11.5 g/dL — ABNORMAL LOW (ref 13.0–17.0)
MCH: 28 pg (ref 26.0–34.0)
MCHC: 31.8 g/dL (ref 30.0–36.0)
MCV: 88.3 fL (ref 80.0–100.0)
Platelets: 511 10*3/uL — ABNORMAL HIGH (ref 150–400)
RBC: 4.1 MIL/uL — ABNORMAL LOW (ref 4.22–5.81)
RDW: 15.4 % (ref 11.5–15.5)
WBC: 9.6 10*3/uL (ref 4.0–10.5)
nRBC: 0 % (ref 0.0–0.2)

## 2022-01-03 LAB — BASIC METABOLIC PANEL
Anion gap: 10 (ref 5–15)
BUN: 19 mg/dL (ref 6–20)
CO2: 25 mmol/L (ref 22–32)
Calcium: 9.1 mg/dL (ref 8.9–10.3)
Chloride: 100 mmol/L (ref 98–111)
Creatinine, Ser: 0.48 mg/dL — ABNORMAL LOW (ref 0.61–1.24)
GFR, Estimated: >60 mL/min (ref >60)
Glucose, Bld: 113 mg/dL — ABNORMAL HIGH (ref 70–99)
Potassium: 4 mmol/L (ref 3.5–5.1)
Sodium: 135 mmol/L (ref 135–145)

## 2022-01-03 LAB — MAGNESIUM: Magnesium: 1.9 mg/dL (ref 1.7–2.4)

## 2022-01-03 SURGERY — DEBRIDEMENT, WOUND, WITH CLOSURE
Anesthesia: General | Site: Arm Upper | Laterality: Right

## 2022-01-03 MED ORDER — CHLORHEXIDINE GLUCONATE 0.12 % MT SOLN
15.0000 mL | Freq: Once | OROMUCOSAL | Status: AC
  Administered 2022-01-03: 15 mL via OROMUCOSAL

## 2022-01-03 MED ORDER — LACTATED RINGERS IV SOLN: INTRAVENOUS | Status: DC

## 2022-01-03 MED ORDER — 0.9 % SODIUM CHLORIDE (POUR BTL) OPTIME
TOPICAL | Status: DC | PRN
  Administered 2022-01-03: 1000 mL

## 2022-01-03 MED ORDER — BUPIVACAINE-EPINEPHRINE (PF) 0.25% -1:200000 IJ SOLN
INTRAMUSCULAR | Status: AC
  Filled 2022-01-03: qty 30

## 2022-01-03 MED ORDER — ACETAMINOPHEN 500 MG PO TABS
1000.0000 mg | ORAL_TABLET | Freq: Once | ORAL | Status: AC
  Administered 2022-01-03: 1000 mg via ORAL
  Filled 2022-01-03: qty 2

## 2022-01-03 MED ORDER — ONDANSETRON HCL 4 MG/2ML IJ SOLN: 4.0000 mg | Freq: Once | INTRAMUSCULAR | Status: DC | PRN

## 2022-01-03 MED ORDER — MIDAZOLAM HCL 2 MG/2ML IJ SOLN
INTRAMUSCULAR | Status: DC | PRN
  Administered 2022-01-03: 2 mg via INTRAVENOUS

## 2022-01-03 MED ORDER — PHENYLEPHRINE 40 MCG/ML (10ML) SYRINGE FOR IV PUSH (FOR BLOOD PRESSURE SUPPORT)
PREFILLED_SYRINGE | INTRAVENOUS | Status: AC
  Filled 2022-01-03: qty 10

## 2022-01-03 MED ORDER — EPINEPHRINE PF 1 MG/ML IJ SOLN
INTRAMUSCULAR | Status: DC | PRN
  Administered 2022-01-03: 1 mg

## 2022-01-03 MED ORDER — MINERAL OIL LIGHT OIL
TOPICAL_OIL | Status: AC
  Filled 2022-01-03: qty 10

## 2022-01-03 MED ORDER — KETAMINE HCL 10 MG/ML IJ SOLN
INTRAMUSCULAR | Status: DC | PRN
  Administered 2022-01-03: 20 mg via INTRAVENOUS
  Administered 2022-01-03: 10 mg via INTRAVENOUS
  Administered 2022-01-03: 20 mg via INTRAVENOUS

## 2022-01-03 MED ORDER — ISOPROPYL ALCOHOL 70 % SOLN
Status: AC
  Filled 2022-01-03: qty 480

## 2022-01-03 MED ORDER — CHLORHEXIDINE GLUCONATE CLOTH 2 % EX PADS
6.0000 | MEDICATED_PAD | Freq: Once | CUTANEOUS | Status: AC
  Administered 2022-01-03: 6 via TOPICAL

## 2022-01-03 MED ORDER — BUPIVACAINE-EPINEPHRINE 0.25% -1:200000 IJ SOLN
INTRAMUSCULAR | Status: DC | PRN
Start: 2022-01-03 — End: 2022-01-03
  Administered 2022-01-03: 35 mL

## 2022-01-03 MED ORDER — FENTANYL CITRATE (PF) 100 MCG/2ML IJ SOLN
INTRAMUSCULAR | Status: AC
  Filled 2022-01-03: qty 2

## 2022-01-03 MED ORDER — PHENYLEPHRINE 40 MCG/ML (10ML) SYRINGE FOR IV PUSH (FOR BLOOD PRESSURE SUPPORT)
PREFILLED_SYRINGE | INTRAVENOUS | Status: DC | PRN
  Administered 2022-01-03: 80 ug via INTRAVENOUS

## 2022-01-03 MED ORDER — PROPOFOL 10 MG/ML IV BOLUS
INTRAVENOUS | Status: AC
  Filled 2022-01-03: qty 20

## 2022-01-03 MED ORDER — SODIUM CHLORIDE 0.9 % IR SOLN
Status: DC | PRN
  Administered 2022-01-03: 1000 mL

## 2022-01-03 MED ORDER — DEXAMETHASONE SODIUM PHOSPHATE 10 MG/ML IJ SOLN
INTRAMUSCULAR | Status: DC | PRN
  Administered 2022-01-03: 10 mg via INTRAVENOUS

## 2022-01-03 MED ORDER — FENTANYL CITRATE (PF) 100 MCG/2ML IJ SOLN
INTRAMUSCULAR | Status: DC | PRN
  Administered 2022-01-03 (×2): 50 ug via INTRAVENOUS

## 2022-01-03 MED ORDER — MIDAZOLAM HCL 2 MG/2ML IJ SOLN
INTRAMUSCULAR | Status: AC
  Filled 2022-01-03: qty 2

## 2022-01-03 MED ORDER — CEFAZOLIN SODIUM-DEXTROSE 2-4 GM/100ML-% IV SOLN
2.0000 g | INTRAVENOUS | Status: AC
  Administered 2022-01-03: 2 g via INTRAVENOUS
  Filled 2022-01-03: qty 100

## 2022-01-03 MED ORDER — PROPOFOL 10 MG/ML IV BOLUS
INTRAVENOUS | Status: DC | PRN
  Administered 2022-01-03: 180 mg via INTRAVENOUS

## 2022-01-03 MED ORDER — KETAMINE HCL 50 MG/5ML IJ SOSY
PREFILLED_SYRINGE | INTRAMUSCULAR | Status: AC
  Filled 2022-01-03: qty 5

## 2022-01-03 MED ORDER — EPINEPHRINE PF 1 MG/ML IJ SOLN
INTRAMUSCULAR | Status: AC
  Filled 2022-01-03: qty 1

## 2022-01-03 MED ORDER — ONDANSETRON HCL 4 MG/2ML IJ SOLN
INTRAMUSCULAR | Status: AC
  Filled 2022-01-03: qty 2

## 2022-01-03 MED ORDER — LIDOCAINE HCL (PF) 2 % IJ SOLN
INTRAMUSCULAR | Status: AC
  Filled 2022-01-03: qty 5

## 2022-01-03 MED ORDER — BUPIVACAINE HCL 0.25 % IJ SOLN
INTRAMUSCULAR | Status: AC
  Filled 2022-01-03: qty 1

## 2022-01-03 MED ORDER — LIDOCAINE 2% (20 MG/ML) 5 ML SYRINGE
INTRAMUSCULAR | Status: DC | PRN
Start: 2022-01-03 — End: 2022-01-03
  Administered 2022-01-03: 80 mg via INTRAVENOUS

## 2022-01-03 MED ORDER — FENTANYL CITRATE PF 50 MCG/ML IJ SOSY: 25.0000 ug | PREFILLED_SYRINGE | INTRAMUSCULAR | Status: DC | PRN

## 2022-01-03 MED ORDER — DEXAMETHASONE SODIUM PHOSPHATE 10 MG/ML IJ SOLN
INTRAMUSCULAR | Status: AC
  Filled 2022-01-03: qty 1

## 2022-01-03 SURGICAL SUPPLY — 50 items
BAG COUNTER SPONGE SURGICOUNT (BAG) IMPLANT
BENZOIN TINCTURE PRP APPL 2/3 (GAUZE/BANDAGES/DRESSINGS) ×1 IMPLANT
BLADE DERMATOME SS (BLADE) ×1 IMPLANT
BNDG ELASTIC 4X5.8 VLCR STR LF (GAUZE/BANDAGES/DRESSINGS) ×2 IMPLANT
BNDG GAUZE ELAST 4 BULKY (GAUZE/BANDAGES/DRESSINGS) IMPLANT
CNTNR URN SCR LID CUP LEK RST (MISCELLANEOUS) ×1 IMPLANT
CONT SPEC 4OZ STRL OR WHT (MISCELLANEOUS)
COVER SURGICAL LIGHT HANDLE (MISCELLANEOUS) ×1 IMPLANT
DECANTER SPIKE VIAL GLASS SM (MISCELLANEOUS) ×2 IMPLANT
DRAPE INCISE IOBAN 66X45 STRL (DRAPES) ×2 IMPLANT
DRAPE LAPAROTOMY T 102X78X121 (DRAPES) ×1 IMPLANT
DRAPE UTILITY XL STRL (DRAPES) ×2 IMPLANT
DRSG ADAPTIC 3X8 NADH LF (GAUZE/BANDAGES/DRESSINGS) ×1 IMPLANT
DRSG HYDROCOLLOID 4X4 (GAUZE/BANDAGES/DRESSINGS) IMPLANT
DRSG MEPITEL 8X12 (GAUZE/BANDAGES/DRESSINGS) ×1 IMPLANT
DRSG PAD ABDOMINAL 8X10 ST (GAUZE/BANDAGES/DRESSINGS) ×1 IMPLANT
DRSG TEGADERM 6X8 (GAUZE/BANDAGES/DRESSINGS) ×1 IMPLANT
DRSG TEGADERM 8X12 (GAUZE/BANDAGES/DRESSINGS) ×1 IMPLANT
DRSG TELFA 3X8 NADH (GAUZE/BANDAGES/DRESSINGS) ×4 IMPLANT
DRSG VAC ATS LRG SENSATRAC (GAUZE/BANDAGES/DRESSINGS) ×1 IMPLANT
ELECT REM PT RETURN 15FT ADLT (MISCELLANEOUS) ×2 IMPLANT
GAUZE SPONGE 4X4 12PLY STRL (GAUZE/BANDAGES/DRESSINGS) ×1 IMPLANT
GLOVE SURG ENC MOIS LTX SZ6.5 (GLOVE) ×2 IMPLANT
GLOVE SURG UNDER POLY LF SZ7.5 (GLOVE) ×1 IMPLANT
GOWN STRL REUS W/TWL LRG LVL3 (GOWN DISPOSABLE) ×4 IMPLANT
GRAFT MYRIAD 5 LAYER 10X20 (Graft) ×1 IMPLANT
HANDPIECE INTERPULSE COAX TIP (DISPOSABLE)
KIT BASIN OR (CUSTOM PROCEDURE TRAY) ×2 IMPLANT
KIT TURNOVER KIT A (KITS) IMPLANT
NEEDLE HYPO 22GX1.5 SAFETY (NEEDLE) ×1 IMPLANT
PACK GENERAL/GYN (CUSTOM PROCEDURE TRAY) ×1 IMPLANT
PACK ORTHO EXTREMITY (CUSTOM PROCEDURE TRAY) ×1 IMPLANT
PAD CAST 4YDX4 CTTN HI CHSV (CAST SUPPLIES) ×1 IMPLANT
PAD DRESSING TELFA 3X8 NADH (GAUZE/BANDAGES/DRESSINGS) IMPLANT
PADDING CAST COTTON 4X4 STRL (CAST SUPPLIES)
SET HNDPC FAN SPRY TIP SCT (DISPOSABLE) IMPLANT
SET IRRIG Y TYPE TUR BLADDER L (SET/KITS/TRAYS/PACK) ×1 IMPLANT
SOL SCRUB PVP POV-IOD 4OZ 7.5% (MISCELLANEOUS) ×2
SOLUTION SCRB POV-IOD 4OZ 7.5% (MISCELLANEOUS) ×1 IMPLANT
STAPLER VISISTAT 35W (STAPLE) ×3 IMPLANT
SUT MNCRL AB 4-0 PS2 18 (SUTURE) ×1 IMPLANT
SUT PROLENE 3 0 PS 2 (SUTURE) ×3 IMPLANT
SUT SILK 4 0 PS 2 (SUTURE) IMPLANT
SUT VIC AB 5-0 PS2 18 (SUTURE) IMPLANT
SWAB COLLECTION DEVICE MRSA (MISCELLANEOUS) IMPLANT
SWAB CULTURE ESWAB REG 1ML (MISCELLANEOUS) ×1 IMPLANT
SYR CONTROL 10ML LL (SYRINGE) ×1 IMPLANT
TOWEL OR 17X24 BULK DISP NS (TOWEL DISPOSABLE) ×1 IMPLANT
TOWEL OR 17X26 10 PK STRL BLUE (TOWEL DISPOSABLE) ×2 IMPLANT
YANKAUER SUCT BULB TIP 10FT TU (MISCELLANEOUS) ×1 IMPLANT

## 2022-01-03 NOTE — Op Note (Signed)
Operative Note   DATE OF OPERATION: 01/03/2022  SURGICAL DEPARTMENT: Plastic Surgery  PREOPERATIVE DIAGNOSES:  open wound right arm  POSTOPERATIVE DIAGNOSES:  same  PROCEDURE:   1)  Debridement with preparation for graft right arm 20x6 (120 cm2) 2) STSG to right arm from right leg 20x6 cm (120cm2) 3) skin graft substitute with Myriad Matrix 22x8 left leg 4) complex closure right upper arm 7 cm  SURGEON: Rexanne Inocencio P. Dorraine Ellender, MD  ASSISTANT: None.  ANESTHESIA:  General.   COMPLICATIONS: None.   INDICATIONS FOR PROCEDURE:  The patient, Jason Buchanan is a 35 y.o. male born on 01/07/1986, is here for treatment of right arm open wound. MRN: [id number]  CONSENT:  Informed consent was obtained directly from the patient. Risks, benefits and alternatives were fully discussed. Specific risks including but not limited to bleeding, infection, hematoma, seroma, scarring, pain, contracture, asymmetry, wound healing problems, and need for further surgery were all discussed. The patient did have an ample opportunity to have questions answered to satisfaction.   DESCRIPTION OF PROCEDURE:  The patient was taken to the operating room. SCDs were placed and preop antibiotics were given. General anesthesia was administered.  The patient's operative site was prepped and draped in a sterile fashion. A time out was performed and all information was confirmed to be correct.    The right arm was irrigated with copious saline.  Debridement was performed primarily with a curette.  Heaped up granulation tissue on the edge of the wound was also debrided.  I was careful not to expose any previous the exposed tendons which were now all covered by granulation tissue.  I injected quarter Marcaine with epinephrine to the arm as well as to the right leg donor site.  Bovie electrocautery was used for hemostasis.  After confirming hemostasis I turned my attention to closure of the right upper arm wound.  I debrided the base of  the wound and widely undermined with a combination of sharp and blunt dissection with tenotomy scissors.  I placed interrupted horizontal mattress sutures to evert the skin.  Following this the skin graft was harvested from the right leg using a Zimmer dermatome at a depth of 15.  I pie crusted the graft with a 11 blade.  Following this the graft was stapled in place and trimmed.  I placed Mepitel over the graft followed by VAC sponge.  Wound VAC was placed and a good seal was confirmed.  Attention was then turned to the right leg.  Myriad skin graft substitute was reconstituted in stapled in place to the donor site.  Mepitel followed by an occlusive dressing was secured over the donor site.  The patient tolerated the procedure well.  There were no complications. The patient was allowed to wake from anesthesia, extubated and taken to the recovery room in satisfactory condition.

## 2022-01-03 NOTE — Interval H&P Note (Signed)
History and Physical Interval Note:  01/03/2022 3:13 PM  [name]  has presented today for surgery, with the diagnosis of Right arm wound.  The various methods of treatment have been discussed with the patient and family. After consideration of risks, benefits and other options for treatment, the patient has consented to  Procedure(s): DEBRIDEMENT AND CLOSURE WOUND (Right) APPLICATION OF Integra (Right) SKIN GRAFT FULL THICKNESS (Right) SKIN GRAFT SPLIT THICKNESS (Right) WOUND VAC CHANGE (Right) as a surgical intervention.  The patient's history has been reviewed, patient examined, no change in status, stable for surgery.  I have reviewed the patient's chart and labs.  Questions were answered to the patient's satisfaction.     Jason Buchanan

## 2022-01-03 NOTE — Anesthesia Procedure Notes (Signed)
Procedure Name: LMA Insertion Date/Time: 01/03/2022 3:51 PM Performed by: Nelle Don, CRNA Pre-anesthesia Checklist: Patient identified, Emergency Drugs available, Suction available and Patient being monitored Patient Re-evaluated:Patient Re-evaluated prior to induction Oxygen Delivery Method: Circle system utilized Preoxygenation: Pre-oxygenation with 100% oxygen Induction Type: IV induction LMA: LMA inserted LMA Size: 4.0 Number of attempts: 1 Dental Injury: Teeth and Oropharynx as per pre-operative assessment

## 2022-01-03 NOTE — Transfer of Care (Signed)
Immediate Anesthesia Transfer of Care Note  Patient: Jason Buchanan  Procedure(s) Performed: DEBRIDEMENT AND COMPLEX CLOSURE OF RIGHT ARM WOUND (Right: Arm Upper) APPLICATION OF Integra (Right: Arm Upper) SKIN GRAFT SPLIT THICKNESS (Right: Arm Upper) WOUND VAC APPLICATION (Right: Arm Upper)  Patient Location: PACU  Anesthesia Type:General  Level of Consciousness: awake  Airway & Oxygen Therapy: Patient Spontanous Breathing and Patient connected to face mask oxygen  Post-op Assessment: Report given to RN and Post -op Vital signs reviewed and stable  Post vital signs: Reviewed and stable  Last Vitals:  Vitals Value Taken Time  BP 115/67 01/03/22 1745  Temp    Pulse 103 01/03/22 1745  Resp 13 01/03/22 1745  SpO2 99 % 01/03/22 1745  Vitals shown include unvalidated device data.  Last Pain:  Vitals:   01/03/22 1520  TempSrc:   PainSc: (P) 8       Patients Stated Pain Goal: 4 (01/03/22 1500)  Complications: No notable events documented.

## 2022-01-03 NOTE — Progress Notes (Signed)
Nutrition Follow-up  DOCUMENTATION CODES:   Severe malnutrition in context of acute illness/injury  INTERVENTION:   -Ensure Enlive po TID, each supplement provides 350 kcal and 20 grams of protein    -Multivitamin with minerals daily   -500 mg Vitamin daily   -Prosource Plus PO TID, each provides 100 kcals and 15g protein  NUTRITION DIAGNOSIS:   Severe Malnutrition related to acute illness, wound healing as evidenced by severe fat depletion, severe muscle depletion.  Ongoing.  GOAL:   Patient will meet greater than or equal to 90% of their needs  Progressing.  MONITOR:   PO intake, Supplement acceptance, Labs, Weight trends, I & O's, Skin  ASSESSMENT:   36 year old man with history of Polysubstance abuse who presented to the hospital with arm swelling, erythema and pain. Patient is a poor historian but  reports history of injecting multiple drugs in both arms and currently receiving treatment for severe sepsis of right upper extremities complicated with streptococcus bacteremia.  12/12 admitted, NPO ->HH diet 12/13: NPO, s/p I&D of left wrist deep abscess, left volar forearm fasciotomy, left wrist carpal tunnel release 12/14: Regular ->NPO 12/15: s/p right hand fasciotomy, right hand carpal tunnel release, right forearm fasciotomy, I&D of hand and forearm, s/p EGD -esophagitis, gastric and duodenal ulcers 12/17: regular diet 12/20: I&D of BUEs 12/23: I&D of bilateral arms 12/29: I&D of bilateral arms w/ Wound VAC  1/4: Wound VAC change, I&D of deep abscess of rt arm 1/5: I&D of LUE, left hand  1/11: s/p debridement of rt arm, skin graft, wound VAC 1/19: s/p debridement, skin graft right arm, Wound VAC placed 1/26: s/p debridement of right arm, wound VAC placed  Patient currently NPO for further surgical intervention today.  Pt does continue to eat 100% of meals when on a diet. Still accepting protein supplements.  Admission weight: 132 lbs. Current weight: 172  lbs  I/Os: -20L since 1/17 UOP: 3.4L x 24 hrs Drain: 50 ml  Medications: Vitamin C, Ferrous sulfate, MAG-OX, Miralax  Labs reviewed.  Diet Order:   Diet Order             Diet NPO time specified  Diet effective now                   EDUCATION NEEDS:   Education needs have been addressed  Skin:  Skin Assessment: Skin Integrity Issues: Skin Integrity Issues:: Incisions Incisions: 12/13: bilateral arms  12/15: right arm 12/20 & 12/23: bilateral arms, 12/29: left arm, 1/4,1/11,1/19, 1/26: rt upper arm  Last BM:  1/30  Height:   Ht Readings from Last 1 Encounters:  11/25/21 5\' 7"  (1.702 m)    Weight:   Wt Readings from Last 1 Encounters:  12/29/21 78.2 kg    BMI:  Body mass index is 27 kg/m.  Estimated Nutritional Needs:   Kcal:  1800-2000  Protein:  95-110g  Fluid:  2L/day   Clayton Bibles, MS, RD, LDN Inpatient Clinical Dietitian Contact information available via Amion

## 2022-01-03 NOTE — Progress Notes (Signed)
Called Dr Desmond Lope about giving pt dilaudid 2 mg IV as he gets on the floor for pain. Order given to give dilaudid 2 mg IV.

## 2022-01-03 NOTE — Plan of Care (Signed)
  Problem: Education: Goal: Knowledge of General Education information will improve Description: Including pain rating scale, medication(s)/side effects and non-pharmacologic comfort measures Outcome: Progressing   Problem: Health Behavior/Discharge Planning: Goal: Ability to manage health-related needs will improve Outcome: Progressing   Problem: Clinical Measurements: Goal: Ability to maintain clinical measurements within normal limits will improve Outcome: Progressing Goal: Will remain free from infection Outcome: Progressing Goal: Diagnostic test results will improve Outcome: Progressing Goal: Respiratory complications will improve Outcome: Progressing Goal: Cardiovascular complication will be avoided Outcome: Progressing   Problem: Activity: Goal: Risk for activity intolerance will decrease Outcome: Progressing   Problem: Nutrition: Goal: Adequate nutrition will be maintained Outcome: Progressing   Problem: Coping: Goal: Level of anxiety will decrease Outcome: Progressing   Problem: Elimination: Goal: Will not experience complications related to bowel motility Outcome: Progressing Goal: Will not experience complications related to urinary retention Outcome: Progressing   Problem: Pain Managment: Goal: General experience of comfort will improve Outcome: Progressing   Problem: Safety: Goal: Ability to remain free from injury will improve Outcome: Progressing   Problem: Skin Integrity: Goal: Risk for impaired skin integrity will decrease Outcome: Progressing   Problem: Fluid Volume: Goal: Hemodynamic stability will improve Outcome: Progressing   Problem: Clinical Measurements: Goal: Diagnostic test results will improve Outcome: Progressing Goal: Signs and symptoms of infection will decrease Outcome: Progressing   Problem: Respiratory: Goal: Ability to maintain adequate ventilation will improve Outcome: Progressing   Problem: Education: Goal:  Knowledge of General Education information will improve Description: Including pain rating scale, medication(s)/side effects and non-pharmacologic comfort measures Outcome: Progressing   Problem: Health Behavior/Discharge Planning: Goal: Ability to manage health-related needs will improve Outcome: Progressing   Problem: Clinical Measurements: Goal: Ability to maintain clinical measurements within normal limits will improve Outcome: Progressing Goal: Will remain free from infection Outcome: Progressing Goal: Diagnostic test results will improve Outcome: Progressing Goal: Respiratory complications will improve Outcome: Progressing Goal: Cardiovascular complication will be avoided Outcome: Progressing   Problem: Activity: Goal: Risk for activity intolerance will decrease Outcome: Progressing   Problem: Nutrition: Goal: Adequate nutrition will be maintained Outcome: Progressing   Problem: Coping: Goal: Level of anxiety will decrease Outcome: Progressing   Problem: Elimination: Goal: Will not experience complications related to bowel motility Outcome: Progressing Goal: Will not experience complications related to urinary retention Outcome: Progressing   Problem: Pain Managment: Goal: General experience of comfort will improve Outcome: Progressing   Problem: Safety: Goal: Ability to remain free from injury will improve Outcome: Progressing   Problem: Skin Integrity: Goal: Risk for impaired skin integrity will decrease Outcome: Progressing   

## 2022-01-03 NOTE — Progress Notes (Signed)
PROGRESS NOTE    Jason Buchanan  [id number] DOB: 05/12/1986 DOA: 11/14/2021 PCP: [name], NP   Brief Narrative:   36 year old male with medical history significant for polysubstance abuse including intravenous drug abuse presented to hospital with bilateral arm pain from injection to arms bilaterally.  CT scan of the upper extremity showed extensive cellulitis and was initially started on broad-spectrum antibiotic.  After hospitalization, patient has undergone multiple bilateral upper extremity I&D's by hand surgery and plastic surgery.  Blood culture was also positive for beta Streptococcus.  On 11/07/2021, patient developed melena with active GI bleed requiring PRBC transfusion.  GI was consulted and patient underwent EGD confirming mild proximal mild esophagitis with nonbleeding ulcers, nonbleeding duodenal ulcers which were injected.  Post procedure, he developed respiratory failure requiring intubation and mechanical ventilation and was subsequently extubated on 11/17/2021.  Patient was subsequently considered stable for transfer out of the ICU and is currently in the hospitalist service.  Patient has been having multiple surgical intervention during hospitalization by plastic surgery as listed in the procedure below.  Plastic surgery planning for further surgical intervention today.  Assessment & Plan:  Sepsis due to bilateral upper extremity cellulitis/streptococcal bacteremia Patient has completed course of antibiotic as per ID.  Patient was on daptomycin for a prolonged duration. Currently on Dilaudid,Tylenol oxycodone and gabapentin for pain management.   Patient continues to require IV Dilaudid for adequate control of pain.  Patient has had several surgical intervention during hospitalization. Patient underwent further debridement on 12/29/21 with placement of wound VAC and preparation for right arm graft.  Communicated with Dr. Yehuda Mao plastic surgery on 01/02/2022 and is for  graft surgery on 01/03/2022.    Polysubstance abuse  History of IV drug abuse.  Patient wishes to undergo drug treatment program after discharge.  Patient was by psychiatry during hospitalization and changed to Seroquel 300 mg at bedtime.    Acute blood loss anemia due to upper GI bleed. EGD done on 11/17/2021 showed mild esophagitis and nonbleeding gastric ulcer.  Status post 2 units of packed RBCs during hospitalization. On iron sulfate.  We will try to avoid NSAIDs.  Latest hemoglobin of 11.5.   Postoperative respiratory failure. Resolved at this time.  Initially required intubation.    Reactive thrombocytosis.  Continue to monitor.     Amphetamine and psychostimulant-induced psychosis with hallucinations  Patient stated that he felt little more depressed and anxious so psychiatry was consulted.  Continue Seroquel and trazodone.  Telemetry sitter due to concern for illicit substance use during hospitalization .  Seen by psychiatry during hospitalization AND Seroquel dose has been increased to 300 mg at bedtime at this time      Hepatitis C 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 Follow-up with hepatology as outpatient     Protein-calorie malnutrition, severe Continue nutritional support.   Disposition Plan: home when okay with surgery.  Plans for further surgical intervention as per plastic surgery.  DVT prophylaxis: SCD  Code Status: Full code  Family Communication:   Spoke with the patient at bedside.  Consultants:  Hand surgery Plastic surgery Orthopedic surgery PCCM Wound Care Psychiatry GI  Procedures:  12/08/21- I&D and debridement of LUE and L hand 12/07/21- Wound vac change of RUE, I&D of deep abscess of  R upper arm 12/03/21- I&D 12/01/21- I&D Incision and drainage and debridement right upper extremity/incision and drainage of left upper extremity by Dr. Lenon Curt hand surgeon 11/23/2021. Wound evaluation left upper extremity with dressing change under sedation/incision and drainage  right upper extremity and loose closure of wound, right upper extremity per Dr. Lenon Curt, hand surgeon 11/18/2021 Right hand fasciotomy/right hand carpal tunnel release/right hand renal scan no release/right forearm 3 compartment fasciotomy/thorough irrigation of hand and forearm/dorsal hand incision and drainage including extensor tendon sheath per Dr. Lenon Curt, hand surgeon 11/17/2021 Central line insertion per PCCM, Dr. Tamala Julian 11/17/2021 Upper endoscopy per GI, Dr. Watt Climes 11/17/2021 Left wrist deep abscess incision and drainage/left volar forearm fasciotomy/left wrist carpal tunnel release extended with Guyon's canal release--per Dr. Greta Doom orthopedics 11/15/2021  12/20 and 12/23: Status post incision and drainage of right and left upper extremity 01/04 wound vac change of the right upper extremity, I&D of deep abscess of the right upper arm.  1/11 Debridement with preparation of skin graft 21x8 cm and 8x2 right arm, Application of wound vac dressing right arm.  01/19 debridement with preparation of skin graft right arm 21x6 and 8,2 right upper arm. Placement of wound vac  12/30/21 debridement with preparation for skin graft.  Antimicrobials:  None    Subjective: Today, patient was seen and examined at bedside.  Patient complains of mild pain.  Denies any nausea vomiting fever or chills.   Objective: Vitals:   01/02/22 0342 01/02/22 1610 01/02/22 2018 01/03/22 0424  BP: 121/90 (!) 142/68 124/74 134/73  Pulse: (!) 106 69 (!) 107 (!) 105  Resp: 20 20 20 20   Temp: 98.1 F (36.7 C) 98.3 F (36.8 C) (!) 97.3 F (36.3 C) 98.1 F (36.7 C)  TempSrc: Oral  Oral Oral  SpO2: 97% 96% 97% 99%  Weight:      Height:        Intake/Output Summary (Last 24 hours) at 01/03/2022 1210 Last data filed at 01/03/2022 0700 Gross per 24 hour  Intake 1580 ml  Output 2250 ml  Net -670 ml    Filed Weights   11/15/21 1608 11/25/21 1342 12/29/21 1000  Weight: 60 kg 60 kg 78.2 kg   Physical  examination  General:  Average built, not in obvious distress HENT:   No scleral pallor or icterus noted. Oral mucosa is moist.  Chest:    Diminished breath sounds bilaterally. No crackles or wheezes.  CVS: S1 &S2 heard. No murmur.  Regular rate and rhythm. Abdomen: Soft, nontender, nondistended.  Bowel sounds are heard.   Extremities: No cyanosis, clubbing or edema.  Peripheral pulses are palpable.  Right upper extremity with wound VAC, left wrist with Ace wrap. Psych: Alert, awake and oriented, normal mood CNS:  No cranial nerve deficits.  Power equal in all extremities.   Skin: Warm and dry.  Bilateral upper extremity wounds.   Data Reviewed: I have personally reviewed following labs and imaging studies  CBC: Recent Labs  Lab 12/30/21 0748 01/01/22 0528 01/03/22 0500  WBC 18.8* 10.2 9.6  HGB 10.7* 11.3* 11.5*  HCT 35.0* 37.3* 36.2*  MCV 87.1 88.0 88.3  PLT 429* 470* 511*    Basic Metabolic Panel: Recent Labs  Lab 12/30/21 0748 01/03/22 0500  NA 136 135  K 3.7 4.0  CL 104 100  CO2 22 25  GLUCOSE 191* 113*  BUN 19 19  CREATININE 0.47* 0.48*  CALCIUM 8.4* 9.1  MG 1.8 1.9    GFR: Estimated Creatinine Clearance: 120.5 mL/min (A) (by C-G formula based on SCr of 0.48 mg/dL (L)). Liver Function Tests: No results for input(s): AST, ALT, ALKPHOS, BILITOT, PROT, ALBUMIN in the last 168 hours. No results for input(s): LIPASE, AMYLASE in the last 168 hours.  No results for input(s): AMMONIA in the last 168 hours. Coagulation Profile: No results for input(s): INR, PROTIME in the last 168 hours. Cardiac Enzymes: No results for input(s): CKTOTAL, CKMB, CKMBINDEX, TROPONINI in the last 168 hours.  BNP (last 3 results) No results for input(s): PROBNP in the last 8760 hours. HbA1C: No results for input(s): HGBA1C in the last 72 hours. CBG: No results for input(s): GLUCAP in the last 168 hours. Lipid Profile: No results for input(s): CHOL, HDL, LDLCALC, TRIG, CHOLHDL,  LDLDIRECT in the last 72 hours. Thyroid Function Tests: No results for input(s): TSH, T4TOTAL, FREET4, T3FREE, THYROIDAB in the last 72 hours. Anemia Panel: No results for input(s): VITAMINB12, FOLATE, FERRITIN, TIBC, IRON, RETICCTPCT in the last 72 hours. Sepsis Labs: No results for input(s): PROCALCITON, LATICACIDVEN in the last 168 hours.  No results found for this or any previous visit (from the past 240 hour(s)).    Radiology Studies: No results found.  Scheduled Meds:  (feeding supplement) PROSource Plus  30 mL Oral TID BM   acetaminophen  1,000 mg Oral Once   acetaminophen  500 mg Oral Q6H   vitamin C  500 mg Oral Daily   Chlorhexidine Gluconate Cloth  6 each Topical Daily   feeding supplement  237 mL Oral TID BM   ferrous sulfate  325 mg Oral Q breakfast   gabapentin  400 mg Oral TID   magnesium oxide  400 mg Oral BID   nicotine  21 mg Transdermal Daily   pantoprazole  40 mg Oral BID   polyethylene glycol  17 g Oral BID   QUEtiapine  300 mg Oral QHS   Continuous Infusions:   ceFAZolin (ANCEF) IV     lactated ringers 20 mL/hr at 12/31/21 1854     LOS: 50 days   Flora Lipps, MD  [facility] If 7PM-7AM, please contact night-coverage [email or web address] 01/03/2022, 12:10 PM

## 2022-01-03 NOTE — Plan of Care (Signed)
Pt for OR today with plastics.  Tele sitter continued  Pain medications per order.   Problem: Education: Goal: Knowledge of General Education information will improve Description: Including pain rating scale, medication(s)/side effects and non-pharmacologic comfort measures Outcome: Progressing   Problem: Health Behavior/Discharge Planning: Goal: Ability to manage health-related needs will improve Outcome: Progressing   Problem: Clinical Measurements: Goal: Ability to maintain clinical measurements within normal limits will improve Outcome: Progressing Goal: Will remain free from infection Outcome: Progressing Goal: Diagnostic test results will improve Outcome: Progressing Goal: Respiratory complications will improve Outcome: Progressing Goal: Cardiovascular complication will be avoided Outcome: Progressing   Problem: Activity: Goal: Risk for activity intolerance will decrease Outcome: Progressing   Problem: Nutrition: Goal: Adequate nutrition will be maintained Outcome: Progressing   Problem: Coping: Goal: Level of anxiety will decrease Outcome: Progressing   Problem: Elimination: Goal: Will not experience complications related to bowel motility Outcome: Progressing Goal: Will not experience complications related to urinary retention Outcome: Progressing   Problem: Pain Managment: Goal: General experience of comfort will improve Outcome: Progressing   Problem: Safety: Goal: Ability to remain free from injury will improve Outcome: Progressing   Problem: Skin Integrity: Goal: Risk for impaired skin integrity will decrease Outcome: Progressing   Problem: Fluid Volume: Goal: Hemodynamic stability will improve Outcome: Progressing   Problem: Clinical Measurements: Goal: Diagnostic test results will improve Outcome: Progressing Goal: Signs and symptoms of infection will decrease Outcome: Progressing   Problem: Respiratory: Goal: Ability to maintain  adequate ventilation will improve Outcome: Progressing   Problem: Education: Goal: Knowledge of General Education information will improve Description: Including pain rating scale, medication(s)/side effects and non-pharmacologic comfort measures Outcome: Progressing   Problem: Health Behavior/Discharge Planning: Goal: Ability to manage health-related needs will improve Outcome: Progressing   Problem: Clinical Measurements: Goal: Ability to maintain clinical measurements within normal limits will improve Outcome: Progressing Goal: Will remain free from infection Outcome: Progressing Goal: Diagnostic test results will improve Outcome: Progressing Goal: Respiratory complications will improve Outcome: Progressing Goal: Cardiovascular complication will be avoided Outcome: Progressing   Problem: Activity: Goal: Risk for activity intolerance will decrease Outcome: Progressing   Problem: Nutrition: Goal: Adequate nutrition will be maintained Outcome: Progressing   Problem: Coping: Goal: Level of anxiety will decrease Outcome: Progressing   Problem: Elimination: Goal: Will not experience complications related to bowel motility Outcome: Progressing Goal: Will not experience complications related to urinary retention Outcome: Progressing   Problem: Pain Managment: Goal: General experience of comfort will improve Outcome: Progressing   Problem: Safety: Goal: Ability to remain free from injury will improve Outcome: Progressing   Problem: Skin Integrity: Goal: Risk for impaired skin integrity will decrease Outcome: Progressing

## 2022-01-03 NOTE — Anesthesia Preprocedure Evaluation (Addendum)
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 with prolonged (>30 min) block after GI procedure)  Airway Mallampati: II  TM Distance: >3 FB Neck ROM: Full    Dental  (+) Dental Advisory Given, Poor Dentition   Pulmonary asthma , Current Smoker and Patient abstained from smoking.,    Pulmonary exam normal breath sounds clear to auscultation       Cardiovascular negative cardio ROS   Rhythm:Regular Rate:Tachycardia     Neuro/Psych PSYCHIATRIC DISORDERS negative neurological ROS     GI/Hepatic negative GI ROS, (+)     substance abuse (Polysubstance abuse)  cocaine use, methamphetamine use and IV drug use,   Endo/Other  negative endocrine ROS  Renal/GU negative Renal ROS     Musculoskeletal negative musculoskeletal ROS (+) Right arm wound   Abdominal   Peds  Hematology  (+) Blood dyscrasia, anemia ,   Anesthesia Other Findings Day of surgery medications reviewed with the patient.  Reproductive/Obstetrics                             Anesthesia Physical Anesthesia Plan  ASA: 3  Anesthesia Plan: General   Post-op Pain Management: Tylenol PO (pre-op)   Induction: Intravenous  PONV Risk Score and Plan: 2 and Midazolam, Dexamethasone and Ondansetron  Airway Management Planned: LMA  Additional Equipment:   Intra-op Plan:   Post-operative Plan: Extubation in OR  Informed Consent: I have reviewed the patients History and Physical, chart, labs and discussed the procedure including the risks, benefits and alternatives for the proposed anesthesia with the patient or authorized representative who  has indicated his/her understanding and acceptance.     Dental advisory given  Plan Discussed with: CRNA  Anesthesia Plan Comments:         Anesthesia Quick Evaluation

## 2022-01-03 NOTE — Plan of Care (Signed)
Pt agitated with blood draw this am. He was upset when he had to be stuck 3 times. This RN was able to convince pt to let another phlebotomist try, pt agreed, blood draw successful. Problem: Education: Goal: Knowledge of General Education information will improve Description: Including pain rating scale, medication(s)/side effects and non-pharmacologic comfort measures Outcome: Progressing   Problem: Health Behavior/Discharge Planning: Goal: Ability to manage health-related needs will improve Outcome: Progressing   Problem: Clinical Measurements: Goal: Ability to maintain clinical measurements within normal limits will improve Outcome: Progressing Goal: Will remain free from infection Outcome: Progressing Goal: Diagnostic test results will improve Outcome: Progressing Goal: Respiratory complications will improve Outcome: Progressing Goal: Cardiovascular complication will be avoided Outcome: Progressing   Problem: Activity: Goal: Risk for activity intolerance will decrease Outcome: Progressing   Problem: Nutrition: Goal: Adequate nutrition will be maintained Outcome: Progressing   Problem: Coping: Goal: Level of anxiety will decrease Outcome: Progressing   Problem: Elimination: Goal: Will not experience complications related to bowel motility Outcome: Progressing Goal: Will not experience complications related to urinary retention Outcome: Progressing   Problem: Pain Managment: Goal: General experience of comfort will improve Outcome: Progressing   Problem: Safety: Goal: Ability to remain free from injury will improve Outcome: Progressing   Problem: Skin Integrity: Goal: Risk for impaired skin integrity will decrease Outcome: Progressing   Problem: Fluid Volume: Goal: Hemodynamic stability will improve Outcome: Progressing   Problem: Clinical Measurements: Goal: Diagnostic test results will improve Outcome: Progressing Goal: Signs and symptoms of infection  will decrease Outcome: Progressing   Problem: Respiratory: Goal: Ability to maintain adequate ventilation will improve Outcome: Progressing   Problem: Education: Goal: Knowledge of General Education information will improve Description: Including pain rating scale, medication(s)/side effects and non-pharmacologic comfort measures Outcome: Progressing   Problem: Health Behavior/Discharge Planning: Goal: Ability to manage health-related needs will improve Outcome: Progressing   Problem: Clinical Measurements: Goal: Ability to maintain clinical measurements within normal limits will improve Outcome: Progressing Goal: Will remain free from infection Outcome: Progressing Goal: Diagnostic test results will improve Outcome: Progressing Goal: Respiratory complications will improve Outcome: Progressing Goal: Cardiovascular complication will be avoided Outcome: Progressing   Problem: Activity: Goal: Risk for activity intolerance will decrease Outcome: Progressing   Problem: Nutrition: Goal: Adequate nutrition will be maintained Outcome: Progressing   Problem: Coping: Goal: Level of anxiety will decrease Outcome: Progressing   Problem: Elimination: Goal: Will not experience complications related to bowel motility Outcome: Progressing Goal: Will not experience complications related to urinary retention Outcome: Progressing   Problem: Pain Managment: Goal: General experience of comfort will improve Outcome: Progressing   Problem: Safety: Goal: Ability to remain free from injury will improve Outcome: Progressing   Problem: Skin Integrity: Goal: Risk for impaired skin integrity will decrease Outcome: Progressing

## 2022-01-03 NOTE — TOC Progression Note (Signed)
Transition of Care [facility]) - Progression Note    Patient Details  Name: Jason Buchanan MRN: [id number] Date of Birth: Nov 20, 1986  Transition of Care [facility]) CM/SW [place], [place] Phone Number: 01/03/2022, 2:17 PM  Clinical Narrative:   Met with patient to cover several questions/issues.  [name] stated he has been told he still has several surgical interventions to go, including removal of his wound drain and plastic surgery for covering of his exposed tendons.  He anticipates being her another week to 2 weeks, based on what he has been told.   He wondered about his insurance coverage and if it was still in place.  I looked up the number for him and told him to call them to find out about date of expiration, as well as hospital coverage and rehab coverage.   [name] has not heard from anyone at the [facility] about his disability application.  I called and left a message for both the receptionist and the director asking for an update. [name] indicated he is interested staying locally at a shelter so he can continue to go to [facility] for medical attention, as well as going to [place] as his understanding is he will need daily dressing changes, and he lacks the dexterity to do this himself.  I called [employer] and left a message for [name], came manager, at [phone].   TOC will continue to follow during the course of hospitalization.     Expected Discharge Plan: Homeless Shelter Barriers to Discharge: Continued Medical Work up  Expected Discharge Plan and Services Expected Discharge Plan: Homeless Shelter   Discharge Planning Services: CM Consult   Living arrangements for the past 2 months: Homeless Shelter Expected Discharge Date: 11/14/21                                     Social Determinants of Health (SDOH) Interventions    Readmission Risk Interventions No flowsheet data found.

## 2022-01-04 ENCOUNTER — Encounter (HOSPITAL_COMMUNITY): Payer: Self-pay | Admitting: Plastic Surgery

## 2022-01-04 NOTE — Anesthesia Postprocedure Evaluation (Signed)
Anesthesia Post Note  Patient: KARRIEM MUENCH  Procedure(s) Performed: DEBRIDEMENT AND COMPLEX CLOSURE OF RIGHT ARM WOUND (Right: Arm Upper) APPLICATION OF Integra (Right: Arm Upper) SKIN GRAFT SPLIT THICKNESS (Right: Arm Upper) WOUND VAC APPLICATION (Right: Arm Upper)     Patient location during evaluation: PACU Anesthesia Type: General Level of consciousness: awake and alert Pain management: pain level controlled Vital Signs Assessment: post-procedure vital signs reviewed and stable Respiratory status: spontaneous breathing, nonlabored ventilation, respiratory function stable and patient connected to nasal cannula oxygen Cardiovascular status: blood pressure returned to baseline and stable Postop Assessment: no apparent nausea or vomiting Anesthetic complications: no   No notable events documented.  Last Vitals:  Vitals:   01/04/22 1623 01/04/22 1650  BP: (!) 138/56 131/66  Pulse: (!) 114 (!) 101  Resp: 18 16  Temp: 36.6 C 36.9 C  SpO2: 98% 99%    Last Pain:  Vitals:   01/04/22 1623  TempSrc: Oral  PainSc: 6                  Collene Schlichter

## 2022-01-04 NOTE — TOC Progression Note (Signed)
Transition of Care [facility]) - Progression Note    Patient Details  Name: SUHAAS AGENA MRN: [id number] Date of Birth: [birth year]  Transition of [facility]) CM/SW Contact  Ida Rogue, [place] Phone Number: 01/04/2022, 10:57 AM  Clinical Narrative:   Received call back from Nicolasa Ducking, administrative assistant at [facility], informing me that they do have referral, but due to back-log, he has not risen to the top of the list yet.  Called Mr Zachery Dauer at [facility] and left message #2.  Reported these findings to patient. TOC will continue to follow during the course of hospitalization.     Expected Discharge Plan: Homeless Shelter Barriers to Discharge: Continued Medical Work up  Expected Discharge Plan and Services Expected Discharge Plan: Homeless Shelter   Discharge Planning Services: CM Consult   Living arrangements for the past 2 months: Homeless Shelter Expected Discharge Date: 11/14/21                                     Social Determinants of Health (SDOH) Interventions    Readmission Risk Interventions No flowsheet data found.

## 2022-01-04 NOTE — Progress Notes (Signed)
PROGRESS NOTE  Jason Buchanan  [id number] DOB: [birth year] DOA: 11/14/2021 PCP: Jason Coots, NP   Brief Narrative/Hospital Course: Jason Buchanan, 36 y.o. male with PMH Past medical history of polysubstance abuse presents with bilateral arm pain.  He has history of injecting multiple drugs in both arms.  CT scan showed upper extremity extensive cellulitis, placed on prednisone antibiotics.  He was admitted with working diagnosis of severe sepsis with right upper extremity cellulitis complicated with streptococcal bacteremia.  He is followed by multiple specialist ID plastic surgery  12/13 I&D of bilateral upper extremities. Blood culture positive for Streptococcus group B.   12/15 patient developed melena with acute blood loss, hemoglobin down to 5.8. He received 2 units packed red blood cell transfusion and was placed on intravenous pantoprazole. 12/15 upper endoscopy showing mild proximal mid esophagitis.  Nonbleeding gastric ulcers with no stigmata of bleeding.  Positive adherent clot.  Nonbleeding duodenal ulcers with adherent clot.  Ulcers were injected. He had postprocedure respiratory failure that required invasive mechanical ventilation.  Extubated late 12/15.  12/20 Incision and drainage and debridement of right upper extremity, incision and drainage of left upper extremity.    12/23 bilateral arms I&D, the OR under anesthesia.    12/29 I and D of b/l UE with wound vac change on right 01/04/22-underwent graft placement on the right upper extremity from right thigh, along with abscess drainage from right arm    Subjective: Seen and examined this morning.  Not in acute distress no new complaints.  Reports he has been ambulatory.  Pain is relatively controlled.  Assessment & Plan:  Sepsis due to b/l UE cellulitis and Streptococcal bacteremia: Sepsis resolved.  Vitals stable afebrile no leukocytosis.  Underwent several surgical intervention and debridement this admission see  above for detail _Patient had graft placed on 1/31 and has wound VAC on her right upper extremity. 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  Seen by psychiatry during this admission   He was feeling depressed and anxious and seen by psychiatry-continue Seroquel at increased dose of 300.  Postoperative pain pain/high tolerance for opiates Currently on iv Dilaudid,Tylenol oxycodone and gabapentin.  If continues to complain of pain can consider increasing Neurontin or adding Cymbalta. Discuss about trying nonopioids and weaning of opiates  Acute blood loss anemia Upper GI bleeding: Hemoglobin relatively stable.  EGD on 12/15 that showed mild esophagitis and nonbleeding gastric ulcer S/P unit PRBC, on iron sulfate avoid NSAIDs Recent Labs  Lab 12/30/21 0748 01/01/22 0528 01/03/22 0500  HGB 10.7* 11.3* 11.5*  HCT 35.0* 37.3* 36.2*     Postoperative respiratory failure-he needed intubation.  Currently on room air  Reactive thrombocytosis: Monitor Hep C antibody positive: He will need outpatient follow-up with the ID and hepatology to address untreated hep C  Severe malnutrition augment diet as below Nutrition Problem: Severe Malnutrition Etiology: acute illness, wound healing Signs/Symptoms: severe fat depletion, severe muscle depletion Interventions: Prostat, MVI, Ensure Enlive (each supplement provides 350kcal and 20 grams of protein)  DVT prophylaxis: Place and maintain sequential compression device Start: 12/04/21 1856 SCDs Start: 11/14/21 1835 Code Status:   Code Status: Full Code Family Communication: plan of care discussed with patient at bedside. Status is: Inpatient Remains inpatient appropriate because: Ongoing management of right upper extremity on  graft with wound VAC and ongoing pain control. Disposition: Currently NOT medically stable for discharge. Anticipated Disposition:Skilled nursing facility  Objective: Vitals last 24 hrs: Vitals:   01/03/22 1815 01/03/22 1836 01/03/22 1953 01/04/22 0320  BP:  114/82 123/72 113/64  Pulse:  (!) 106 (!) 101 100  Resp:  18 20 20   Temp: 98 F (36.7 C) 98 F (36.7 C) 98.1 F (36.7 C) 98.3 F (36.8 C)  TempSrc:  Oral Oral Oral  SpO2:  100% 97% 98%  Weight:      Height:       Weight change:   Physical Examination: General exam: AA0X3, anxious weak,older than stated age. HEENT:Oral mucosa moist, Ear/Nose WNL grossly,dentition normal. Respiratory system: B/l CLEAR BS, no use of accessory muscle, non tender. Cardiovascular system: S1 & S2 +,No JVD. Gastrointestinal system: Abdomen soft, NT,ND, BS+. Nervous System:Alert, awake, moving extremities. Extremities: edema none, distal peripheral pulses palpable.  Skin: No rashes, no icterus.  Dressing present on the right thigh and right upper extremity with wound VAC in place MSK: THINmuscle bulk, tone, power.  Medications reviewed:  Scheduled Meds:  (feeding supplement) PROSource Plus  30 mL Oral TID BM   acetaminophen  500 mg Oral Q6H   vitamin C  500 mg Oral Daily   Chlorhexidine Gluconate Cloth  6 each Topical Daily   feeding supplement  237 mL Oral TID BM   ferrous sulfate  325 mg Oral Q breakfast   gabapentin  400 mg Oral TID   magnesium oxide  400 mg Oral BID   nicotine  21 mg Transdermal Daily   pantoprazole  40 mg Oral BID   polyethylene glycol  17 g Oral BID   QUEtiapine  300 mg Oral QHS   Continuous Infusions:  lactated ringers Stopped (01/03/22 1745)    Diet Order             Diet regular Room service appropriate? Yes; Fluid consistency: Thin  Diet effective now                   Nutrition Problem: Severe Malnutrition Etiology: acute illness, wound healing Signs/Symptoms: severe fat depletion, severe muscle  depletion Interventions: Prostat, MVI, Ensure Enlive (each supplement provides 350kcal and 20 grams of protein)   Intake/Output Summary (Last 24 hours) at 01/04/2022 1334 Last data filed at 01/04/2022 0500 Gross per 24 hour  Intake 1501.25 ml  Output 2850 ml  Net -1348.75 ml   Net IO Since Admission: -43,513.55 mL [01/04/22 1334]  Wt Readings from Last 3 Encounters:  12/29/21 78.2 kg  09/27/21 60 kg  09/18/21 60 kg     Antimicrobials: Anti-infectives (From admission, onward)    Start     Dose/Rate Route Frequency Ordered Stop   01/03/22 1400  ceFAZolin (ANCEF) IVPB 2g/100 mL premix        2 g 200 mL/hr over 30 Minutes Intravenous On call to O.R. 01/03/22 0230 01/03/22 1622   12/29/21 0600  ceFAZolin (ANCEF) IVPB 2g/100 mL premix        2 g 200 mL/hr over 30 Minutes Intravenous On call to O.R. 12/28/21 2211 12/29/21 1346   11/17/21 1700  cefTRIAXone (ROCEPHIN) 2 g in sodium chloride 0.9 % 100 mL IVPB  Status:  Discontinued        2 g 200 mL/hr over 30 Minutes Intravenous Every 24 hours 11/17/21 1019 11/21/21 1047   11/17/21 1700  DAPTOmycin (CUBICIN) 500 mg in sodium chloride 0.9 % IVPB  Status:  Discontinued        8 mg/kg  60 kg 120 mL/hr over 30 Minutes Intravenous Daily 11/17/21 1555 12/23/21 1605   11/17/21 1200  metroNIDAZOLE (FLAGYL) IVPB 500 mg  Status:  Discontinued        500 mg 100 mL/hr over 60 Minutes Intravenous Every 12 hours 11/17/21 1019 11/18/21 0924   11/15/21 1000  cefTRIAXone (ROCEPHIN) 2 g in sodium chloride 0.9 % 100 mL IVPB  Status:  Discontinued        2 g 200 mL/hr over 30 Minutes Intravenous Every 24 hours 11/14/21 1834 11/15/21 0405   11/15/21 1000  clindamycin (CLEOCIN) IVPB 600 mg  Status:  Discontinued        600 mg 100 mL/hr over 30 Minutes Intravenous Every 8 hours 11/15/21 0405 11/15/21 1523   11/15/21 0800  penicillin G potassium 12 Million Units in dextrose 5 % 500 mL continuous infusion  Status:  Discontinued        12 Million Units 41.7  mL/hr over 12 Hours Intravenous Every 12 hours 11/15/21 0405 11/17/21 1956   11/14/21 2200  vancomycin (VANCOREADY) IVPB 750 mg/150 mL  Status:  Discontinued        750 mg 150 mL/hr over 60 Minutes Intravenous Every 12 hours 11/14/21 1524 11/15/21 0405   11/14/21 0900  vancomycin (VANCOCIN) IVPB 1000 mg/200 mL premix        1,000 mg 200 mL/hr over 60 Minutes Intravenous  Once 11/14/21 0849 11/14/21 1043   11/14/21 0900  cefTRIAXone (ROCEPHIN) 2 g in sodium chloride 0.9 % 100 mL IVPB        2 g 200 mL/hr over 30 Minutes Intravenous  Once 11/14/21 0849 11/14/21 0935      Culture/Microbiology    Component Value Date/Time   SDES  11/16/2021 0451    BLOOD RIGHT FOOT Performed at [facility], [place] [address], [place], [place] [address]    SDES  11/16/2021 0451    BLOOD RIGHT ANKLE Performed at [facility], [place] [address] [place], [place] [address]    SPECREQUEST  11/16/2021 0451    BOTTLES DRAWN AEROBIC ONLY Blood Culture adequate volume Performed at [place] [address] [place], [place] [address]    SPECREQUEST  11/16/2021 0451    BOTTLES DRAWN AEROBIC ONLY Blood Culture adequate volume Performed at [place] [address] [place], [place] [address]    CULT  11/16/2021 0451    NO GROWTH 5 DAYS Performed at [place] [address], [place], [place] [address]    CULT  11/16/2021 0451    NO GROWTH 5 DAYS Performed at [place] [address] [place], [place] [address]    REPTSTATUS 11/21/2021 FINAL 11/16/2021 0451   REPTSTATUS 11/21/2021 FINAL 11/16/2021 0451   Data Reviewed: I have personally reviewed following labs and imaging studies CBC: Recent Labs  Lab 12/30/21 0748 01/01/22 0528 01/03/22 0500  WBC 18.8* 10.2 9.6  HGB 10.7* 11.3* 11.5*  HCT 35.0* 37.3* 36.2*  MCV 87.1 88.0 88.3  PLT 429* 470* [id number]*   Basic Metabolic Panel: Recent Labs  Lab  12/30/21 0748 01/03/22 0500  NA 136 135  K 3.7 4.0  CL 104 100  CO2 22 25  GLUCOSE 191* 113*  BUN 19 19  CREATININE 0.47* 0.48*  CALCIUM 8.4* 9.1  MG 1.8 1.9     LOS: 51 days   Antonieta Pert, MD [facility]  01/04/2022, 1:34 PM

## 2022-01-04 NOTE — Progress Notes (Signed)
OT Cancellation Note  Patient Details Name: Jason Buchanan MRN: [id number] DOB: Jun 12, 1986   Cancelled Treatment:    Reason Eval/Treat Not Completed: Other (comment)patient with increased pain in RUE on this date. Patient was updated on surgeon recommendations. Per secure chat with Dr.Luppens this AM requesting to hold off on RUE ROM until post op day 5 (01/09/22).Patient verbalized understanding. OT to check back with patient on 2/6 for continuation of ROM.  Sharyn Blitz OTR/L, MS Acute Rehabilitation Department Office# [phone] Pager# [phone]   01/04/2022, 10:45 AM

## 2022-01-04 NOTE — Plan of Care (Signed)
Pt aox4, cooperative with staff.  Wound vac intact to RUE.  RLE site dressing intact, dressing present, order to reinforce PRN.  Pain management per orders.  Problem: Education: Goal: Knowledge of General Education information will improve Description: Including pain rating scale, medication(s)/side effects and non-pharmacologic comfort measures Outcome: Progressing   Problem: Health Behavior/Discharge Planning: Goal: Ability to manage health-related needs will improve Outcome: Progressing   Problem: Clinical Measurements: Goal: Ability to maintain clinical measurements within normal limits will improve Outcome: Progressing Goal: Will remain free from infection Outcome: Progressing Goal: Diagnostic test results will improve Outcome: Progressing Goal: Respiratory complications will improve Outcome: Progressing Goal: Cardiovascular complication will be avoided Outcome: Progressing   Problem: Activity: Goal: Risk for activity intolerance will decrease Outcome: Progressing   Problem: Nutrition: Goal: Adequate nutrition will be maintained Outcome: Progressing   Problem: Coping: Goal: Level of anxiety will decrease Outcome: Progressing   Problem: Elimination: Goal: Will not experience complications related to bowel motility Outcome: Progressing Goal: Will not experience complications related to urinary retention Outcome: Progressing   Problem: Pain Managment: Goal: General experience of comfort will improve Outcome: Progressing   Problem: Safety: Goal: Ability to remain free from injury will improve Outcome: Progressing   Problem: Skin Integrity: Goal: Risk for impaired skin integrity will decrease Outcome: Progressing   Problem: Fluid Volume: Goal: Hemodynamic stability will improve Outcome: Progressing   Problem: Clinical Measurements: Goal: Diagnostic test results will improve Outcome: Progressing Goal: Signs and symptoms of infection will decrease Outcome:  Progressing   Problem: Respiratory: Goal: Ability to maintain adequate ventilation will improve Outcome: Progressing   Problem: Education: Goal: Knowledge of General Education information will improve Description: Including pain rating scale, medication(s)/side effects and non-pharmacologic comfort measures Outcome: Progressing   Problem: Health Behavior/Discharge Planning: Goal: Ability to manage health-related needs will improve Outcome: Progressing   Problem: Clinical Measurements: Goal: Ability to maintain clinical measurements within normal limits will improve Outcome: Progressing Goal: Will remain free from infection Outcome: Progressing Goal: Diagnostic test results will improve Outcome: Progressing Goal: Respiratory complications will improve Outcome: Progressing Goal: Cardiovascular complication will be avoided Outcome: Progressing   Problem: Activity: Goal: Risk for activity intolerance will decrease Outcome: Progressing   Problem: Nutrition: Goal: Adequate nutrition will be maintained Outcome: Progressing   Problem: Coping: Goal: Level of anxiety will decrease Outcome: Progressing   Problem: Elimination: Goal: Will not experience complications related to bowel motility Outcome: Progressing Goal: Will not experience complications related to urinary retention Outcome: Progressing   Problem: Pain Managment: Goal: General experience of comfort will improve Outcome: Progressing   Problem: Safety: Goal: Ability to remain free from injury will improve Outcome: Progressing   Problem: Skin Integrity: Goal: Risk for impaired skin integrity will decrease Outcome: Progressing

## 2022-01-04 NOTE — Progress Notes (Addendum)
Yellow MEWS Note HR elevated due to pain.  Medications being given per orders. Ice pack applied, repositioned.  Vitals rechecked pt now back into green.  Charge nurse notified. No additional interventions required.    01/04/22 1650  Assess: MEWS Score  Temp 98.4 F (36.9 C)  BP 131/66  Pulse Rate (!) 101  Resp 16  Level of Consciousness Alert  SpO2 99 %  O2 Device Room Air  Assess: MEWS Score  MEWS Temp 0  MEWS Systolic 0  MEWS Pulse 1  MEWS RR 0  MEWS LOC 0  MEWS Score 1  MEWS Score Color Green  Assess: if the MEWS score is Yellow or Red  Were vital signs taken at a resting state? Yes  Focused Assessment No change from prior assessment  Does the patient meet 2 or more of the SIRS criteria? No  Does the patient have a confirmed or suspected source of infection? No  Provider and Rapid Response Notified? No  MEWS guidelines implemented *See Row Information* No, vital signs rechecked  Notify: Charge Nurse/RN  Name of Charge Nurse/RN Notified Dahlia Client  Date Charge Nurse/RN Notified 01/04/22  Time Charge Nurse/RN Notified 1645  Document  Patient Outcome Stabilized after interventions  Progress note created (see row info) Yes  Assess: SIRS CRITERIA  SIRS Temperature  0  SIRS Pulse 1  SIRS Respirations  0  SIRS WBC 0  SIRS Score Sum  1

## 2022-01-05 NOTE — Progress Notes (Signed)
PROGRESS NOTE  Jason Buchanan  YPP:[id number] DOB: [birth year] DOA: 11/14/2021 PCP: Lavinia Sharps, NP   Brief Narrative/Hospital Course: Jason Buchanan, 36 y.o. male with PMH Past medical history of polysubstance abuse presents with bilateral arm pain.  He has history of injecting multiple drugs in both arms.  CT scan showed upper extremity extensive cellulitis, placed on prednisone antibiotics.  He was admitted with working diagnosis of severe sepsis with right upper extremity cellulitis complicated with streptococcal bacteremia.  He is followed by multiple specialist ID plastic surgery  12/13 I&D of bilateral upper extremities. Blood culture positive for Streptococcus group B.   12/15 patient developed melena with acute blood loss, hemoglobin down to 5.8. He received 2 units packed red blood cell transfusion and was placed on intravenous pantoprazole. 12/15 upper endoscopy showing mild proximal mid esophagitis.  Nonbleeding gastric ulcers with no stigmata of bleeding.  Positive adherent clot.  Nonbleeding duodenal ulcers with adherent clot.  Ulcers were injected. He had postprocedure respiratory failure that required invasive mechanical ventilation.  Extubated late 12/15.  12/20 Incision and drainage and debridement of right upper extremity, incision and drainage of left upper extremity.    12/23 bilateral arms I&D, the OR under anesthesia.    12/29 I and D of b/l UE with wound vac change on right 01/04/22-underwent graft placement on the right upper extremity from right thigh, along with abscess drainage from right arm     Subjective: Seen examined this morning.  Patient reports he spoke with plastic surgeon yesterday-possibly going back to the OR early next week Overnight afebrile hemodynamically stable. Pain controlled  Assessment & Plan:  Sepsis due to b/l UE cellulitis and Streptococcal bacteremia: Sepsis resolved.Vitals stable afebrile no leukocytosis.  Underwent several  surgical intervention and debridement this admission see above for detail,Patient had graft placed on 1/31 and has wound VAC on her right upper extremity.  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  Seen by psychiatry during this admission   He was feeling depressed and anxious and seen by psychiatry-continue Seroquel at increased dose of 300.  Mood stable  Postoperative pain pain/high tolerance for opiates Currently on iv Dilaudid,Tylenol oxycodone and gabapentin.  We discussed that we will need to start backing off on IV Dilaudid and transition to oral regimen. can consider increasing Neurontin or adding Cymbalta.   Acute blood loss anemia Upper GI bleeding: Hemoglobin is stable.EGD on 12/15 that showed mild esophagitis and nonbleeding gastric ulcer S/P unit PRBC, on iron sulfate avoid NSAIDs Recent Labs  Lab 12/30/21 0748 01/01/22 0528 01/03/22 0500  HGB 10.7* 11.3* 11.5*  HCT 35.0* 37.3* 36.2*    Postoperative respiratory failure-he needed intubation.  On room air.   Reactive thrombocytosis: Monitor Hep C antibody positive: He will need follow-up with outpatient ID/Hepatology to address untreated hep C  Severe malnutrition augment diet as below Nutrition Problem: Severe Malnutrition Etiology: acute illness, wound healing Signs/Symptoms: severe fat depletion, severe muscle depletion Interventions: Prostat, MVI, Ensure Enlive (each supplement provides 350kcal and 20 grams of protein)  DVT prophylaxis: Place and maintain sequential compression device Start: 12/04/21 1856 SCDs Start: 11/14/21 1835 Code Status:   Code Status: Full Code Family Communication: plan of care discussed with patient at bedside. Status is:  Inpatient Remains inpatient  appropriate because: Ongoing management of right upper extremity on graft with wound VAC and ongoing pain control. Disposition: Currently NOT medically stable for discharge. Anticipated Disposition:Skilled nursing facility  Objective: Vitals last 24 hrs: Vitals:   01/04/22 0320 01/04/22 1623 01/04/22 1650 01/05/22 0504  BP: 113/64 (!) 138/56 131/66 127/76  Pulse: 100 (!) 114 (!) 101 92  Resp: 20 18 16 16   Temp: 98.3 F (36.8 C) 97.9 F (36.6 C) 98.4 F (36.9 C) 98.1 F (36.7 C)  TempSrc: Oral Oral  Oral  SpO2: 98% 98% 99% 98%  Weight:      Height:       Weight change:   Physical Examination: General exam: AAOx 3, older than stated age, weak appearing. HEENT:Oral mucosa moist, Ear/Nose WNL grossly, dentition normal. Respiratory system: bilaterally clear breath sounds, no use of accessory muscle Cardiovascular system: S1 & S2 +, No JVD,. Gastrointestinal system: Abdomen soft, NT,ND, BS+ Nervous System:Alert, awake, moving extremities and grossly nonfocal Extremities: Right upper extremity with wound VAC in place tender to touch, right thigh donor graft site with dressing in place edema. Skin: No rashes,no icterus. MSK: Normal muscle bulk,tone, power   Medications reviewed:  Scheduled Meds:  (feeding supplement) PROSource Plus  30 mL Oral TID BM   acetaminophen  500 mg Oral Q6H   vitamin C  500 mg Oral Daily   Chlorhexidine Gluconate Cloth  6 each Topical Daily   feeding supplement  237 mL Oral TID BM   ferrous sulfate  325 mg Oral Q breakfast   gabapentin  400 mg Oral TID   magnesium oxide  400 mg Oral BID   nicotine  21 mg Transdermal Daily   pantoprazole  40 mg Oral BID   polyethylene glycol  17 g Oral BID   QUEtiapine  300 mg Oral QHS   Continuous Infusions:  lactated ringers Stopped (01/03/22 1745)    Diet Order             Diet regular Room service appropriate? Yes; Fluid consistency: Thin  Diet effective now                    Nutrition Problem: Severe Malnutrition Etiology: acute illness, wound healing Signs/Symptoms: severe fat depletion, severe muscle depletion Interventions: Prostat, MVI, Ensure Enlive (each supplement provides 350kcal and 20 grams of protein)   Intake/Output Summary (Last 24 hours) at 01/05/2022 1142 Last data filed at 01/04/2022 1752 Gross per 24 hour  Intake 840 ml  Output 1400 ml  Net -560 ml   Net IO Since Admission: -43,833.55 mL [01/05/22 1142]  Wt Readings from Last 3 Encounters:  12/29/21 78.2 kg  09/27/21 60 kg  09/18/21 60 kg     Antimicrobials: Anti-infectives (From admission, onward)    Start     Dose/Rate Route Frequency Ordered Stop   01/03/22 1400  ceFAZolin (ANCEF) IVPB 2g/100 mL premix        2 g 200 mL/hr over 30 Minutes Intravenous On call to O.R. 01/03/22 0230 01/03/22 1622   12/29/21 0600  ceFAZolin (ANCEF) IVPB 2g/100 mL premix        2 g 200 mL/hr over 30 Minutes Intravenous On call to O.R. 12/28/21 2211 12/29/21 1346   11/17/21 1700  cefTRIAXone (ROCEPHIN) 2 g in sodium chloride 0.9 % 100 mL IVPB  Status:  Discontinued        2 g 200 mL/hr over 30 Minutes Intravenous Every 24 hours 11/17/21 1019 11/21/21 1047   11/17/21 1700  DAPTOmycin (CUBICIN) 500 mg in sodium chloride 0.9 % IVPB  Status:  Discontinued        8 mg/kg  60 kg 120 mL/hr over 30 Minutes Intravenous Daily 11/17/21 1555 12/23/21 1605   11/17/21 1200  metroNIDAZOLE (FLAGYL) IVPB 500 mg  Status:  Discontinued        500 mg 100 mL/hr over 60 Minutes Intravenous Every 12 hours 11/17/21 1019 11/18/21 0924   11/15/21 1000  cefTRIAXone (ROCEPHIN) 2 g in sodium chloride 0.9 % 100 mL IVPB  Status:  Discontinued        2 g 200 mL/hr over 30 Minutes Intravenous Every 24 hours 11/14/21 1834 11/15/21 0405   11/15/21 1000  clindamycin (CLEOCIN) IVPB 600 mg  Status:  Discontinued        600 mg 100 mL/hr over 30 Minutes Intravenous Every 8 hours 11/15/21 0405 11/15/21 1523   11/15/21 0800   penicillin G potassium 12 Million Units in dextrose 5 % 500 mL continuous infusion  Status:  Discontinued        12 Million Units 41.7 mL/hr over 12 Hours Intravenous Every 12 hours 11/15/21 0405 11/17/21 1956   11/14/21 2200  vancomycin (VANCOREADY) IVPB 750 mg/150 mL  Status:  Discontinued        750 mg 150 mL/hr over 60 Minutes Intravenous Every 12 hours 11/14/21 1524 11/15/21 0405   11/14/21 0900  vancomycin (VANCOCIN) IVPB 1000 mg/200 mL premix        1,000 mg 200 mL/hr over 60 Minutes Intravenous  Once 11/14/21 0849 11/14/21 1043   11/14/21 0900  cefTRIAXone (ROCEPHIN) 2 g in sodium chloride 0.9 % 100 mL IVPB        2 g 200 mL/hr over 30 Minutes Intravenous  Once 11/14/21 0849 11/14/21 0935      Culture/Microbiology    Component Value Date/Time   SDES  11/16/2021 0451    BLOOD RIGHT FOOT Performed at [facility], [place] [address], [place], [place] [address]    SDES  11/16/2021 0451    BLOOD RIGHT ANKLE Performed at [facility], [place] [address] [place], [place] [address]    SPECREQUEST  11/16/2021 0451    BOTTLES DRAWN AEROBIC ONLY Blood Culture adequate volume Performed at [place] [address], [place], [place] [address]    SPECREQUEST  11/16/2021 0451    BOTTLES DRAWN AEROBIC ONLY Blood Culture adequate volume Performed at [place] [facility] [address], [place], [place] [address]    CULT  11/16/2021 0451    NO GROWTH 5 DAYS Performed at [facility] [address], [place], [place] [address]    CULT  11/16/2021 0451    NO GROWTH 5 DAYS Performed at [facility] [address] [place], [place] [address]    REPTSTATUS 11/21/2021 FINAL 11/16/2021 0451   REPTSTATUS 11/21/2021 FINAL 11/16/2021 0451   Data Reviewed: I have personally reviewed following labs and imaging studies CBC: Recent Labs  Lab 12/30/21 0748 01/01/22 0528 01/03/22 0500  WBC 18.8* 10.2 9.6  HGB  10.7* 11.3* 11.5*  HCT 35.0* 37.3* 36.2*  MCV 87.1 88.0 88.3  PLT 429* 470* [id number]*   Basic Metabolic Panel: Recent Labs  Lab 12/30/21 0748 01/03/22 0500  NA 136 135  K 3.7 4.0  CL 104 100  CO2 22 25  GLUCOSE 191* 113*  BUN 19 19  CREATININE 0.47* 0.48*  CALCIUM 8.4* 9.1  MG 1.8 1.9  LOS: 47 days   Antonieta Pert, MD [facility]  01/05/2022, 11:42 AM

## 2022-01-06 NOTE — Plan of Care (Signed)
No acute events overnight. Problem: Education: Goal: Knowledge of General Education information will improve Description: Including pain rating scale, medication(s)/side effects and non-pharmacologic comfort measures Outcome: Progressing   Problem: Health Behavior/Discharge Planning: Goal: Ability to manage health-related needs will improve Outcome: Progressing   Problem: Clinical Measurements: Goal: Ability to maintain clinical measurements within normal limits will improve Outcome: Progressing Goal: Will remain free from infection Outcome: Progressing Goal: Diagnostic test results will improve Outcome: Progressing Goal: Respiratory complications will improve Outcome: Progressing Goal: Cardiovascular complication will be avoided Outcome: Progressing   Problem: Activity: Goal: Risk for activity intolerance will decrease Outcome: Progressing   Problem: Nutrition: Goal: Adequate nutrition will be maintained Outcome: Progressing   Problem: Coping: Goal: Level of anxiety will decrease Outcome: Progressing   Problem: Elimination: Goal: Will not experience complications related to bowel motility Outcome: Progressing Goal: Will not experience complications related to urinary retention Outcome: Progressing   Problem: Pain Managment: Goal: General experience of comfort will improve Outcome: Progressing   Problem: Safety: Goal: Ability to remain free from injury will improve Outcome: Progressing   Problem: Skin Integrity: Goal: Risk for impaired skin integrity will decrease Outcome: Progressing   Problem: Fluid Volume: Goal: Hemodynamic stability will improve Outcome: Progressing   Problem: Clinical Measurements: Goal: Diagnostic test results will improve Outcome: Progressing Goal: Signs and symptoms of infection will decrease Outcome: Progressing   Problem: Respiratory: Goal: Ability to maintain adequate ventilation will improve Outcome: Progressing   Problem:  Education: Goal: Knowledge of General Education information will improve Description: Including pain rating scale, medication(s)/side effects and non-pharmacologic comfort measures Outcome: Progressing   Problem: Health Behavior/Discharge Planning: Goal: Ability to manage health-related needs will improve Outcome: Progressing   Problem: Clinical Measurements: Goal: Ability to maintain clinical measurements within normal limits will improve Outcome: Progressing Goal: Will remain free from infection Outcome: Progressing Goal: Diagnostic test results will improve Outcome: Progressing Goal: Respiratory complications will improve Outcome: Progressing Goal: Cardiovascular complication will be avoided Outcome: Progressing   Problem: Activity: Goal: Risk for activity intolerance will decrease Outcome: Progressing   Problem: Nutrition: Goal: Adequate nutrition will be maintained Outcome: Progressing   Problem: Coping: Goal: Level of anxiety will decrease Outcome: Progressing   Problem: Elimination: Goal: Will not experience complications related to bowel motility Outcome: Progressing Goal: Will not experience complications related to urinary retention Outcome: Progressing   Problem: Pain Managment: Goal: General experience of comfort will improve Outcome: Progressing   Problem: Safety: Goal: Ability to remain free from injury will improve Outcome: Progressing   Problem: Skin Integrity: Goal: Risk for impaired skin integrity will decrease Outcome: Progressing

## 2022-01-06 NOTE — TOC Progression Note (Signed)
Transition of Care [facility]) - Progression Note    Patient Details  Name: TAJIRI SEKEL MRN: [id number] Date of Birth: July 06, 1986  Transition of Care [facility]) CM/SW [place], [place] Phone Number: 01/06/2022, 11:39 AM  Clinical Narrative:   Spoke with front desk at [facility] and explained I was looking for a bed for a medically compromised male and had not gotten a call back from [employer] nor case manager.  She explained that all referrals must go to the hot line, and they will call back when bed becomes available.  They are now on calls that came in the third week in January.  This is because they are still not at capacity due to [facility] guidelines and COVID restrictions. I left message on the hot line to have them call me when there is an opening. TOC will continue to follow during the course of hospitalization.     Expected Discharge Plan: Homeless Shelter Barriers to Discharge: Continued Medical Work up  Expected Discharge Plan and Services Expected Discharge Plan: Homeless Shelter   Discharge Planning Services: CM Consult   Living arrangements for the past 2 months: Homeless Shelter Expected Discharge Date: 11/14/21                                     Social Determinants of Health (SDOH) Interventions    Readmission Risk Interventions No flowsheet data found.

## 2022-01-06 NOTE — Progress Notes (Signed)
PROGRESS NOTE  Jason Buchanan  [id number] DOB: Jul 29, 1986 DOA: 11/14/2021 PCP: Jason Coots, NP   Brief Narrative/Hospital Course: Jason Buchanan, 36 y.o. male with PMH Past medical history of polysubstance abuse presents with bilateral arm pain.  He has history of injecting multiple drugs in both arms.  CT scan showed upper extremity extensive cellulitis, placed on prednisone antibiotics.  He was admitted with working diagnosis of severe sepsis with right upper extremity cellulitis complicated with streptococcal bacteremia.  He is followed by multiple specialist ID plastic surgery  12/13 I&D of bilateral upper extremities. Blood culture positive for Streptococcus group B.   12/15 patient developed melena with acute blood loss, hemoglobin down to 5.8. He received 2 units packed red blood cell transfusion and was placed on intravenous pantoprazole. 12/15 upper endoscopy showing mild proximal mid esophagitis.  Nonbleeding gastric ulcers with no stigmata of bleeding.  Positive adherent clot.  Nonbleeding duodenal ulcers with adherent clot.  Ulcers were injected. He had postprocedure respiratory failure that required invasive mechanical ventilation.  Extubated late 12/15.  12/20 Incision and drainage and debridement of right upper extremity, incision and drainage of left upper extremity.    12/23 bilateral arms I&D, the OR under anesthesia.    12/29 I and D of b/l UE with wound vac change on right 01/04/22-underwent graft placement on the right upper extremity from right thigh, along with abscess drainage from right arm     Subjective:  Seen and examined this morning.  Reports that he has ongoing pain in his right upper extremity.  Reports that he saw plastic surgery 2 nights before and he was told he will need further debridement on Monday . Patient is on telemetry sitter for safety evaluation. I asked him if he is doing any drugs he denies. Overnight afebrile blood pressure stable  on room air Assessment & Plan:  Sepsis due to b/l UE cellulitis and Streptococcal bacteremia: Sepsis parameters resolved. S/p several surgical intervention and debridement this admission see above for detail,Patient had graft placed on 1/31 and has wound VAC on her right upper extremity, plastic surgery likely planning further intervention on Monday. 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  Mood stable, but complains of ongoing pain.  He is on Tourist information centre manager for safety.  Postoperative pain pain/high tolerance for opiates Currently on iv Dilaudid,Tylenol oxycodone and gabapentin.  We discussed that we will need to start backing off on IV Dilaudid and transition to oral regimen. can consider increasing Neurontin or adding Cymbalta, reports he would like to wait until his procedure on Monday before weaning down on IV steroids.   Acute blood loss anemia Upper GI bleeding: Hb stable,EGD on 12/15 that showed mild esophagitis and nonbleeding gastric ulcer S/P unit PRBC, on iron sulfate avoid NSAIDs Recent Labs  Lab 01/01/22 0528 01/03/22 0500  HGB 11.3* 11.5*  HCT 37.3* 36.2*     Postoperative respiratory failure-he needed intubation.  On room air.   Reactive thrombocytosis: Monitor Hep C antibody positive: He will need follow-up with outpatient ID/Hepatology to address untreated hep C  Severe malnutrition augment diet as below Nutrition Problem: Severe Malnutrition Etiology: acute illness, wound healing Signs/Symptoms: severe fat depletion, severe muscle depletion  Interventions: Prostat, MVI, Ensure Enlive (each supplement provides 350kcal and 20 grams of  protein)  DVT prophylaxis: Place and maintain sequential compression device Start: 12/04/21 1856 SCDs Start: 11/14/21 1835 Code Status:   Code Status: Full Code Family Communication: plan of care discussed with patient at bedside. Status is: Inpatient Remains inpatient appropriate because: Ongoing management of right upper extremity on graft with wound VAC and ongoing pain control. Disposition: Currently NOT medically stable for discharge. Anticipated Disposition:Skilled nursing facility  Objective: Vitals last 24 hrs: Vitals:   01/05/22 0504 01/05/22 1400 01/05/22 2009 01/06/22 0327  BP: 127/76 132/79 128/86 (!) 145/85  Pulse: 92 87 (!) 103 96  Resp: 16 17 16 15   Temp: 98.1 F (36.7 C) 98.3 F (36.8 C) 98.7 F (37.1 C) 98.3 F (36.8 C)  TempSrc: Oral Oral Oral Oral  SpO2: 98% 99% 98% 100%  Weight:      Height:       Weight change:   Physical Examination: General exam: AA,ox3, talking as if he in pain,appears older than stated age, weak appearing. HEENT:Oral mucosa moist, Ear/Nose WNL grossly, dentition normal. Respiratory system: bilaterally diminished, no use of accessory muscle Cardiovascular system: S1 & S2 +, No JVD,. Gastrointestinal system: Abdomen soft,NT,ND,BS+ Nervous System:Alert, awake, moving extremities and grossly nonfocal Extremities: Right upper extremity with wound VAC in place, tender all over more on medial area.  Right lower extremity thigh graft site with dressing in place. Skin: No rashes,no icterus. MSK: Normal muscle bulk,tone, power   Medications reviewed:  Scheduled Meds:  (feeding supplement) PROSource Plus  30 mL Oral TID BM   acetaminophen  500 mg Oral Q6H   vitamin C  500 mg Oral Daily   Chlorhexidine Gluconate Cloth  6 each Topical Daily   feeding supplement  237 mL Oral TID BM   ferrous sulfate  325 mg Oral Q breakfast   gabapentin  400 mg Oral TID   magnesium oxide  400 mg Oral BID   nicotine  21 mg Transdermal Daily   pantoprazole   40 mg Oral BID   polyethylene glycol  17 g Oral BID   QUEtiapine  300 mg Oral QHS   Continuous Infusions:  lactated ringers Stopped (01/03/22 1745)    Diet Order             Diet regular Room service appropriate? Yes; Fluid consistency: Thin  Diet effective now                   Nutrition Problem: Severe Malnutrition Etiology: acute illness, wound healing Signs/Symptoms: severe fat depletion, severe muscle depletion Interventions: Prostat, MVI, Ensure Enlive (each supplement provides 350kcal and 20 grams of protein)   Intake/Output Summary (Last 24 hours) at 01/06/2022 1038 Last data filed at 01/06/2022 0900 Gross per 24 hour  Intake 970 ml  Output 5500 ml  Net -4530 ml    Net IO Since Admission: RR:[id number] mL [01/06/22 1038]  Wt Readings from Last 3 Encounters:  12/29/21 78.2 kg  09/27/21 60 kg  09/18/21 60 kg     Antimicrobials: Anti-infectives (From admission, onward)    Start     Dose/Rate Route Frequency Ordered Stop   01/03/22 1400  ceFAZolin (ANCEF) IVPB 2g/100 mL premix        2 g 200 mL/hr over 30 Minutes Intravenous On call to O.R. 01/03/22 0230 01/03/22 1622   12/29/21 0600  ceFAZolin (ANCEF) IVPB 2g/100 mL premix        2 g 200  mL/hr over 30 Minutes Intravenous On call to O.R. 12/28/21 2211 12/29/21 1346   11/17/21 1700  cefTRIAXone (ROCEPHIN) 2 g in sodium chloride 0.9 % 100 mL IVPB  Status:  Discontinued        2 g 200 mL/hr over 30 Minutes Intravenous Every 24 hours 11/17/21 1019 11/21/21 1047   11/17/21 1700  DAPTOmycin (CUBICIN) 500 mg in sodium chloride 0.9 % IVPB  Status:  Discontinued        8 mg/kg  60 kg 120 mL/hr over 30 Minutes Intravenous Daily 11/17/21 1555 12/23/21 1605   11/17/21 1200  metroNIDAZOLE (FLAGYL) IVPB 500 mg  Status:  Discontinued        500 mg 100 mL/hr over 60 Minutes Intravenous Every 12 hours 11/17/21 1019 11/18/21 0924   11/15/21 1000  cefTRIAXone (ROCEPHIN) 2 g in sodium chloride 0.9 % 100 mL IVPB  Status:   Discontinued        2 g 200 mL/hr over 30 Minutes Intravenous Every 24 hours 11/14/21 1834 11/15/21 0405   11/15/21 1000  clindamycin (CLEOCIN) IVPB 600 mg  Status:  Discontinued        600 mg 100 mL/hr over 30 Minutes Intravenous Every 8 hours 11/15/21 0405 11/15/21 1523   11/15/21 0800  penicillin G potassium 12 Million Units in dextrose 5 % 500 mL continuous infusion  Status:  Discontinued        12 Million Units 41.7 mL/hr over 12 Hours Intravenous Every 12 hours 11/15/21 0405 11/17/21 1956   11/14/21 2200  vancomycin (VANCOREADY) IVPB 750 mg/150 mL  Status:  Discontinued        750 mg 150 mL/hr over 60 Minutes Intravenous Every 12 hours 11/14/21 1524 11/15/21 0405   11/14/21 0900  vancomycin (VANCOCIN) IVPB 1000 mg/200 mL premix        1,000 mg 200 mL/hr over 60 Minutes Intravenous  Once 11/14/21 0849 11/14/21 1043   11/14/21 0900  cefTRIAXone (ROCEPHIN) 2 g in sodium chloride 0.9 % 100 mL IVPB        2 g 200 mL/hr over 30 Minutes Intravenous  Once 11/14/21 0849 11/14/21 0935      Culture/Microbiology    Component Value Date/Time   SDES  11/16/2021 0451    BLOOD RIGHT FOOT Performed at [facility], [place] [address], [place], [place] [address]    SDES  11/16/2021 0451    BLOOD RIGHT ANKLE Performed at [facility], [place] [address], [place], [place] [address]    SPECREQUEST  11/16/2021 0451    BOTTLES DRAWN AEROBIC ONLY Blood Culture adequate volume Performed at [place] [address], [place], [place] [address]    SPECREQUEST  11/16/2021 0451    BOTTLES DRAWN AEROBIC ONLY Blood Culture adequate volume Performed at [place] [address], [place], [place] [address]    CULT  11/16/2021 0451    NO GROWTH 5 DAYS Performed at [place] [address] [place], [place] [address]    CULT  11/16/2021 0451    NO GROWTH 5 DAYS Performed at [place] [address],  [place], [place] [address]    REPTSTATUS 11/21/2021 FINAL 11/16/2021 0451   REPTSTATUS 11/21/2021 FINAL 11/16/2021 0451   Data Reviewed: I have personally reviewed following labs and imaging studies CBC: Recent Labs  Lab 01/01/22 0528 01/03/22 0500  WBC 10.2 9.6  HGB 11.3* 11.5*  HCT 37.3* 36.2*  MCV 88.0  88.3  PLT 470* 511*    Basic Metabolic Panel: Recent Labs  Lab 01/03/22 0500  NA 135  K 4.0  CL 100  CO2 25  GLUCOSE 113*  BUN 19  CREATININE 0.48*  CALCIUM 9.1  MG 1.9      LOS: 53 days   Antonieta Pert, MD [facility]  01/06/2022, 10:38 AM

## 2022-01-07 NOTE — Progress Notes (Signed)
PROGRESS NOTE    Jason Buchanan  [id number] DOB: [birth year] DOA: 11/14/2021 PCP: [name], NP   Brief Narrative:  Jason Buchanan, 36 y.o. male with PMH Past medical history of polysubstance abuse presents with bilateral arm pain.  He has history of injecting multiple drugs in both arms.  CT scan showed upper extremity extensive cellulitis, placed on prednisone antibiotics.  He was admitted with working diagnosis of severe sepsis with right upper extremity cellulitis complicated with streptococcal bacteremia.  He is followed by multiple specialist ID plastic surgery   12/13 I&D of bilateral upper extremities. Blood culture positive for Streptococcus group B.   12/15 patient developed melena with acute blood loss, hemoglobin down to 5.8. He received 2 units packed red blood cell transfusion and was placed on intravenous pantoprazole. 12/15 upper endoscopy showing mild proximal mid esophagitis.  Nonbleeding gastric ulcers with no stigmata of bleeding.  Positive adherent clot.  Nonbleeding duodenal ulcers with adherent clot.  Ulcers were injected. He had postprocedure respiratory failure that required invasive mechanical ventilation.  Extubated late 12/15.   12/20 Incision and drainage and debridement of right upper extremity, incision and drainage of left upper extremity.    12/23 bilateral arms I&D, the OR under anesthesia.    12/29 I and D of b/l UE with wound vac change on right 01/04/22-underwent graft placement on the right upper extremity from right thigh, along with abscess drainage from right arm  Assessment & Plan:   Sepsis due to b/l UE cellulitis and Streptococcal bacteremia: -Sepsis parameters resolved.  -S/p several surgical intervention and debridement this admission see above for detail, -Patient had graft placed on 1/31 and has wound VAC on her right upper extremity -plastic surgery likely planning further intervention on Monday. 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 -He was feeling depressed and anxious and seen by psychiatry-Seroquel increased dose of 300.   -Mood stable, but complains of ongoing pain.  He is on Tourist information centre manager for safety.   Postoperative pain pain/high tolerance for opiates -Currently on iv Dilaudid,Tylenol oxycodone and gabapentin.     Acute blood loss anemia Upper GI bleeding: -Hb stable,EGD on 12/15 that showed mild esophagitis and nonbleeding gastric ulcer S/P unit PRBC, on iron sulfate avoid NSAIDs   Postoperative respiratory failure-he needed intubation.  On room air.    Reactive thrombocytosis: Monitor  Hep C antibody positive: He will need follow-up with outpatient ID/Hepatology to address untreated hep C  Severe malnutrition augment diet as below Nutrition Problem: Severe Malnutrition Etiology: acute illness, wound healing Signs/Symptoms: severe fat depletion, severe muscle depletion Interventions: Prostat, MVI, Ensure Enlive (each supplement provides 350kcal and 20 grams of protein)    DVT prophylaxis: SCD Code Status: Full code Family Communication:  None present at bedside.  Plan of care discussed with patient in length and he verbalized understanding and agreed with it. Disposition Plan: To be determined  Consultants:  Plastic surgery Infectious disease PCCM Psychiatry  Procedures:  See above  Antimicrobials:  See above  Status is: Inpatient    Subjective: Patient seen and examined.  Reports constant pain in his right arm and right thigh.  He tells me that plastic surgery told him for possible wound debridement on Monday.  He is trying to eat healthy including high-protein diet.  No fever overnight.  No acute events overnight. He is on  telemetry sitter.  Objective: Vitals:   01/06/22 1346 01/06/22 2050  01/07/22 0557 01/07/22 1408  BP:  (!) 147/72 131/78 132/63  Pulse:  (!) 101 100 98  Resp:  18 16 16   Temp: 98.1 F (36.7 C) 99.1 F (37.3 C)  98.3 F (36.8 C)  TempSrc: Oral Oral  Oral  SpO2:  99% 97% 98%  Weight:      Height:        Intake/Output Summary (Last 24 hours) at 01/07/2022 1434 Last data filed at 01/07/2022 0945 Gross per 24 hour  Intake 984 ml  Output 2500 ml  Net -1516 ml   Filed Weights   11/15/21 1608 11/25/21 1342 12/29/21 1000  Weight: 60 kg 60 kg 78.2 kg    Examination:  General exam: Appears calm and comfortable, on room air, appears weak, sick, complaining of pain Respiratory system: Clear to auscultation. Respiratory effort normal. Cardiovascular system: S1 & S2 heard, RRR. No JVD, murmurs, rubs, gallops or clicks. No pedal edema. Gastrointestinal system: Abdomen is nondistended, soft and nontender. No organomegaly or masses felt. Normal bowel sounds heard. Central nervous system: Alert and oriented. No focal neurological deficits. Extremities: Wound VAC noted on right upper extremity.  Tender to touch right thigh: Dressing dry and intact.  No signs of active bleeding or discharge seen-tender to touch. Skin: No rashes, lesions or ulcers Psychiatry: Judgement and insight appear normal. Mood & affect appropriate.    Data Reviewed: I have personally reviewed following labs and imaging studies  CBC: Recent Labs  Lab 01/01/22 0528 01/03/22 0500  WBC 10.2 9.6  HGB 11.3* 11.5*  HCT 37.3* 36.2*  MCV 88.0 88.3  PLT 470* [id number]*   Basic Metabolic Panel: Recent Labs  Lab 01/03/22 0500  NA 135  K 4.0  CL 100  CO2 25  GLUCOSE 113*  BUN 19  CREATININE 0.48*  CALCIUM 9.1  MG 1.9   GFR: Estimated Creatinine Clearance: 120.5 mL/min (A) (by C-G formula based on SCr of 0.48 mg/dL (L)). Liver Function Tests: No results for input(s): AST, ALT, ALKPHOS, BILITOT, PROT, ALBUMIN in the last 168 hours. No results for input(s): LIPASE, AMYLASE in the last 168  hours. No results for input(s): AMMONIA in the last 168 hours. Coagulation Profile: No results for input(s): INR, PROTIME in the last 168 hours. Cardiac Enzymes: No results for input(s): CKTOTAL, CKMB, CKMBINDEX, TROPONINI in the last 168 hours. BNP (last 3 results) No results for input(s): PROBNP in the last 8760 hours. HbA1C: No results for input(s): HGBA1C in the last 72 hours. CBG: No results for input(s): GLUCAP in the last 168 hours. Lipid Profile: No results for input(s): CHOL, HDL, LDLCALC, TRIG, CHOLHDL, LDLDIRECT in the last 72 hours. Thyroid Function Tests: No results for input(s): TSH, T4TOTAL, FREET4, T3FREE, THYROIDAB in the last 72 hours. Anemia Panel: No results for input(s): VITAMINB12, FOLATE, FERRITIN, TIBC, IRON, RETICCTPCT in the last 72 hours. Sepsis Labs: No results for input(s): PROCALCITON, LATICACIDVEN in the last 168 hours.  No results found for this or any previous visit (from the past 240 hour(s)).    Radiology Studies: No results found.  Scheduled Meds:  (feeding supplement) PROSource Plus  30 mL Oral TID BM   acetaminophen  500 mg Oral Q6H   vitamin C  500 mg Oral Daily   Chlorhexidine Gluconate Cloth  6 each Topical Daily   feeding supplement  237 mL Oral TID BM   ferrous sulfate  325 mg Oral Q breakfast  gabapentin  400 mg Oral TID   magnesium oxide  400 mg Oral BID   nicotine  21 mg Transdermal Daily   pantoprazole  40 mg Oral BID   polyethylene glycol  17 g Oral BID   QUEtiapine  300 mg Oral QHS   Continuous Infusions:  lactated ringers Stopped (01/03/22 1745)     LOS: 54 days   Time spent: 30 minutes   Jason Amato Loann Quill, MD [facility]  If 7PM-7AM, please contact night-coverage [email or web address] 01/07/2022, 2:34 PM

## 2022-01-08 LAB — CBC
HCT: 36.8 % — ABNORMAL LOW (ref 39.0–52.0)
Hemoglobin: 11.5 g/dL — ABNORMAL LOW (ref 13.0–17.0)
MCH: 27.2 pg (ref 26.0–34.0)
MCHC: 31.3 g/dL (ref 30.0–36.0)
MCV: 87 fL (ref 80.0–100.0)
Platelets: 436 10*3/uL — ABNORMAL HIGH (ref 150–400)
RBC: 4.23 MIL/uL (ref 4.22–5.81)
RDW: 15.8 % — ABNORMAL HIGH (ref 11.5–15.5)
WBC: 10 10*3/uL (ref 4.0–10.5)
nRBC: 0 % (ref 0.0–0.2)

## 2022-01-08 MED ORDER — HYDROMORPHONE HCL 1 MG/ML IJ SOLN
1.0000 mg | Freq: Once | INTRAMUSCULAR | Status: AC
  Administered 2022-01-08: 1 mg via INTRAVENOUS
  Filled 2022-01-08: qty 1

## 2022-01-08 MED ORDER — IBUPROFEN 200 MG PO TABS
600.0000 mg | ORAL_TABLET | Freq: Once | ORAL | Status: AC
Start: 2022-01-08 — End: 2022-01-08
  Administered 2022-01-08: 600 mg via ORAL
  Filled 2022-01-08: qty 3

## 2022-01-08 NOTE — TOC Transition Note (Signed)
Transition of Care [facility]) - CM/SW Discharge Note   Patient Details  Name: Jason Buchanan MRN: [id number] Date of Birth: 01-25-1986  [facility] CM/SW Contact:  Lacey Wallman, Vinnie Langton, LCSW Phone Number: 01/08/2022, 3:30 PM   Clinical Narrative:     Patient is homeless and reports having nowhere to go at time of discharge.  LCSW left HIPAA compliant messages on hotline voicemail at [facility] (# [phone]) and [facility] ([phone]), but has yet to receive a return call.  LCSW also left HIPAA compliant message on voicemail for [facility] in [facility] [phone]).  LCSW provided patient with a complete list of inpatient treatment facilities and encouraged him to begin contacting today to check bed availability.      Final next level of care: Homeless Shelter Barriers to Discharge: Other (must enter comment) (Homeless)   Patient Goals and CMS Choice Patient states their goals for this hospitalization and ongoing recovery are:: "Get into residential treatment". CMS Medicare.gov Compare Post Acute Care list provided to:: Patient Choice offered to / list presented to : Patient  Discharge Placement                       Discharge Plan and Services   Discharge Planning Services: CM Consult              DME Agency: NA                  Social Determinants of Health (SDOH) Interventions     Readmission Risk Interventions No flowsheet data found.

## 2022-01-08 NOTE — Progress Notes (Signed)
Called by MD regarding patient, concern for donor site drainage and odor Patient reports donor site is painful   PE BP 118/65 (BP Location: Left Leg)    Pulse 98    Temp 97.6 F (36.4 C) (Oral)    Resp 20    Ht _0  (1.702 m)    Wt 78.2 kg    SpO2 97%    BMI 27.00 kg/m   Right leg Myriad graft is starting to reabsorb and is the light colored material that looks like exudate, some green tint  Graft right arm looks intact  A/P Donor site and skin graft site do not appear infected but may have some pseudomonas colonization  Donor site removed, may redress with xeroform or adaptec and dry dressing at least daily to keep dry or may leave open to air.  Please encourage patient to shower which he has not been able to do to wound vac.

## 2022-01-08 NOTE — Progress Notes (Addendum)
Pt noted to have a strong odor with copious amounts of tan and bloody drainage coming from the site of the skin graft on his right leg. The dressing was coming up on the edges. There are no orders for wound care for the right leg. RN reinforced dressing with ABD pads and kerlex.

## 2022-01-08 NOTE — Progress Notes (Addendum)
PROGRESS NOTE    ISSAK Buchanan  [id number] DOB: 27-Oct-1986 DOA: 11/14/2021 PCP: [name], NP   Brief Narrative:  Jason Buchanan, 36 y.o. male with PMH Past medical history of polysubstance abuse presents with bilateral arm pain.  He has history of injecting multiple drugs in both arms.  CT scan showed upper extremity extensive cellulitis, placed on prednisone antibiotics.  He was admitted with working diagnosis of severe sepsis with right upper extremity cellulitis complicated with streptococcal bacteremia.  He is followed by multiple specialist ID plastic surgery   12/13 I&D of bilateral upper extremities. Blood culture positive for Streptococcus group B.   12/15 patient developed melena with acute blood loss, hemoglobin down to 5.8. He received 2 units packed red blood cell transfusion and was placed on intravenous pantoprazole. 12/15 upper endoscopy showing mild proximal mid esophagitis.  Nonbleeding gastric ulcers with no stigmata of bleeding.  Positive adherent clot.  Nonbleeding duodenal ulcers with adherent clot.  Ulcers were injected. He had postprocedure respiratory failure that required invasive mechanical ventilation.  Extubated late 12/15.   12/20 Incision and drainage and debridement of right upper extremity, incision and drainage of left upper extremity.    12/23 bilateral arms I&D, the OR under anesthesia.    12/29 I and D of b/l UE with wound vac change on right 01/04/22-underwent graft placement on the right upper extremity from right thigh, along with abscess drainage from right arm  Assessment & Plan:   Sepsis due to b/l UE cellulitis and Streptococcal bacteremia: -Sepsis parameters resolved.  -S/p several surgical intervention and debridement this admission see above for detail, -Patient had graft placed on 1/31 and has wound VAC on her right upper extremity -OK to discharge from plastic surgery standpoint and recommended outpatient follow  up.  Purulent discharge from donor graft site: (Right thigh) -Discussed with Dr. Erin Hearing. -Likely Pseudomonas colonization -I agree-Donor site do not appears to be infected.  Needs daily dressing -He is afebrile. Non septic appearance. 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 -He was feeling depressed and anxious and seen by psychiatry-Seroquel increased dose of 300.   -Mood stable, but complains of ongoing pain.  He is on Tourist information centre manager for safety.   Postoperative pain pain/high tolerance for opiates -Currently on iv Dilaudid,Tylenol oxycodone and gabapentin.     Acute blood loss anemia Upper GI bleeding: -Hb stable,EGD on 12/15 that showed mild esophagitis and nonbleeding gastric ulcer S/P unit PRBC, on iron sulfate avoid NSAIDs   Postoperative respiratory failure-he needed intubation.  On room air.    Reactive thrombocytosis: Monitor  Hep C antibody positive: He will need follow-up with outpatient ID/Hepatology to address untreated hep C  Severe malnutrition augment diet as below Nutrition Problem: Severe Malnutrition Etiology: acute illness, wound healing Signs/Symptoms: severe fat depletion, severe muscle depletion Interventions: Prostat, MVI, Ensure Enlive (each supplement provides 350kcal and 20 grams of protein)   Addendum: Plastic surgery is okay for the discharge and recommended outpatient follow-up however patient is homeless. TOC gave him a list of treatment facilities and he was encouraged to find availability asap.   Disposition Plan: to be determined  Consultants:  Plastic surgery Infectious disease PCCM Psychiatry  Procedures:  See above  Antimicrobials:  See above  Status is: Inpatient    Subjective: Patient seen and examined.  He reports greenish copious  amount of foul-smelling drainage from donor site (right thigh).  He reports constant pain.  No fever overnight, no acute events overnight. As per RN: Patient's dressing was changed this morning.  Objective: Vitals:   01/07/22 0557 01/07/22 1408 01/07/22 1946 01/08/22 0543  BP: 131/78 132/63 115/79 118/65  Pulse: 100 98 97 98  Resp: 16 16 18 20   Temp:  98.3 F (36.8 C) 98.2 F (36.8 C) 97.6 F (36.4 C)  TempSrc:  Oral Oral Oral  SpO2: 97% 98% 98% 97%  Weight:      Height:        Intake/Output Summary (Last 24 hours) at 01/08/2022 1209 Last data filed at 01/08/2022 0558 Gross per 24 hour  Intake 720 ml  Output 1600 ml  Net -880 ml    Filed Weights   11/15/21 1608 11/25/21 1342 12/29/21 1000  Weight: 60 kg 60 kg 78.2 kg    Examination:  General exam: Appears calm and comfortable, on room air, appears weak, sick, complaining of pain Respiratory system: Clear to auscultation. Respiratory effort normal. Cardiovascular system: S1 & S2 heard, RRR. No JVD, murmurs, rubs, gallops or clicks. No pedal edema. Gastrointestinal system: Abdomen is nondistended, soft and nontender. No organomegaly or masses felt. Normal bowel sounds heard. Central nervous system: Alert and oriented. No focal neurological deficits. Extremities: Wound VAC noted on right upper extremity.      Psychiatry: Judgement and insight appear normal. Mood & affect appropriate.    Data Reviewed: I have personally reviewed following labs and imaging studies  CBC: Recent Labs  Lab 01/03/22 0500  WBC 9.6  HGB 11.5*  HCT 36.2*  MCV 88.3  PLT 511*    Basic Metabolic Panel: Recent Labs  Lab 01/03/22 0500  NA 135  K 4.0  CL 100  CO2 25  GLUCOSE 113*  BUN 19  CREATININE 0.48*  CALCIUM 9.1  MG 1.9    GFR: Estimated Creatinine Clearance: 120.5 mL/min (A) (by C-G formula based on SCr of 0.48 mg/dL (L)). Liver Function Tests: No results for input(s): AST, ALT, ALKPHOS, BILITOT, PROT, ALBUMIN in the last  168 hours. No results for input(s): LIPASE, AMYLASE in the last 168 hours. No results for input(s): AMMONIA in the last 168 hours. Coagulation Profile: No results for input(s): INR, PROTIME in the last 168 hours. Cardiac Enzymes: No results for input(s): CKTOTAL, CKMB, CKMBINDEX, TROPONINI in the last 168 hours. BNP (last 3 results) No results for input(s): PROBNP in the last 8760 hours. HbA1C: No results for input(s): HGBA1C in the last 72 hours. CBG: No results for input(s): GLUCAP in the last 168 hours. Lipid Profile: No results for input(s): CHOL, HDL, LDLCALC, TRIG, CHOLHDL, LDLDIRECT in the last 72 hours. Thyroid Function Tests: No results for input(s): TSH, T4TOTAL, FREET4, T3FREE, THYROIDAB in the last 72 hours. Anemia Panel: No results for input(s): VITAMINB12, FOLATE, FERRITIN, TIBC, IRON, RETICCTPCT in the last 72 hours. Sepsis Labs: No results for input(s): PROCALCITON, LATICACIDVEN in the last 168 hours.  No results found for this or any previous visit (from the past 240 hour(s)).    Radiology Studies: No results found.  Scheduled Meds:  (feeding supplement) PROSource Plus  30 mL Oral TID BM   acetaminophen  500 mg Oral Q6H   vitamin C  500 mg Oral Daily   Chlorhexidine Gluconate Cloth  6 each Topical Daily   feeding supplement  237 mL Oral TID BM   ferrous sulfate  325 mg Oral Q breakfast  gabapentin  400 mg Oral TID   magnesium oxide  400 mg Oral BID   nicotine  21 mg Transdermal Daily   pantoprazole  40 mg Oral BID   polyethylene glycol  17 g Oral BID   QUEtiapine  300 mg Oral QHS   Continuous Infusions:  lactated ringers Stopped (01/03/22 1745)     LOS: 55 days   Time spent: 30 minutes   Adora Yeh Loann Quill, MD [facility]  If 7PM-7AM, please contact night-coverage [email or web address] 01/08/2022, 12:09 PM

## 2022-01-09 MED ORDER — PANTOPRAZOLE SODIUM 40 MG PO TBEC
40.0000 mg | DELAYED_RELEASE_TABLET | Freq: Every day | ORAL | Status: DC
  Administered 2022-01-10 – 2022-01-17 (×8): 40 mg via ORAL
  Filled 2022-01-09 (×8): qty 1

## 2022-01-09 MED ORDER — HYDROMORPHONE HCL 1 MG/ML IJ SOLN
1.0000 mg | INTRAMUSCULAR | Status: DC | PRN
  Administered 2022-01-09 – 2022-01-13 (×28): 1 mg via INTRAVENOUS
  Filled 2022-01-09 (×29): qty 1

## 2022-01-09 NOTE — Assessment & Plan Note (Signed)
Dietitian recommendations (1/25): 1. Ensure Enlive poTID, each supplement provides 350 kcal and 20 grams of protein 2. Multivitamin with minerals daily 3. 500 mg Vitamin daily 4. Prosource Plus POTID, each provides 100 kcals and 15g protein

## 2022-01-09 NOTE — Progress Notes (Signed)
Occupational Therapy Treatment Patient Details Name: Jason Buchanan MRN: [id number] DOB: 1986-05-07 Today's Date: 01/09/2022   History of present illness Pt is 36 yo male presented on 12/12 with bil UE edema/erythema after IV drug use.  Pt required R hand fasciotomy, carpal tunnel release, 3 compartment forearm fasciotomy, Guyon's canal release , and I and D hand/forearm on 12/15; and L wrist I and D, left volar forearm fasciotmoy, and L wrist carpal tunnel release on 12/13. On 12/14 concern for GIB and required 3 untis PRBC with ED 12/15. on 2/1 patient underwent skin graft on R forearm from R thigh.  Pt with hx of polysubstance abuse.   OT comments  Patient participated in ROM on L wrist and digits with AAROM and PROM to maintain ability to engage in ADLs. Patient noted to have slight edema in ulnar side of palm with patient reporting keeping wrist in neutral during the day for comfort. Patient was educated on alternative positioning to keep edema down in palm. Patient was noted to have redness around sutures on volar and dorsal surface of L hand with secure message sent to MD. Patient was also reporting discomfort with staples at this time as well. Patient would continue to benefit from skilled OT services at this time while admitted and after d/c to address noted deficits in order to improve overall safety and independence in ADLs.     Recommendations for follow up therapy are one component of a multi-disciplinary discharge planning process, led by the attending physician.  Recommendations may be updated based on patient status, additional functional criteria and insurance authorization.    Follow Up Recommendations  Outpatient OT    Assistance Recommended at Discharge PRN  Patient can return home with the following  Assistance with cooking/housework;A lot of help with bathing/dressing/bathroom   Equipment Recommendations  None recommended by OT    Recommendations for Other Services       Precautions / Restrictions Precautions Precautions: Fall Precaution Comments: skin graft R forearm Restrictions Other Position/Activity Restrictions: Dr.Luppens 2/6 via secure chat- ok to start ranging freely.       Mobility Bed Mobility                    Transfers                         Balance                                           ADL either performed or assessed with clinical judgement   ADL                                              Extremity/Trunk Assessment Upper Extremity Assessment RUE Deficits / Details: wound VAC has been removed. patient awaiting plastics ok for ROM on this side at this time. patient digits still at same level as last session. LUE Deficits / Details: patient noted to have staples in anterior forearm at this time. as well as redness around sutures on volar and dorsal surfaces. patient noted to have edema in ulnar side of hand with increased positioning in neutral promoting edema in this area. patient noted to have one area of sutures visible on  dorsal side of hand near wrist junction.       Cervical / Trunk Assessment Cervical / Trunk Assessment: Normal    Vision       Perception     Praxis      Cognition Arousal/Alertness: Awake/alert Behavior During Therapy: WFL for tasks assessed/performed Overall Cognitive Status: Within Functional Limits for tasks assessed                                          Exercises Other Exercises Other Exercises: LUE: Patient was noted to have increased ROM of digits on this date with patient able to tolerate gentle AAROM of wrist flexion and extension on this date. patient reporting discomfort near sutures and staples. Other Exercises: patient still able to wiggle digits with same range as prior to skin graft at this time. patient reported laying hand on table and forcing digits straight. patient was educated on how forcing  digits straight is putting them at an increased risk of injury as hand has natural curves to it and plastics was reporting no exertional ROM at this time. patient verbalized understanding. Other Exercises: passive ROM was provided with wrist, digits at each joint with increased time of LUE.    Shoulder Instructions       General Comments      Pertinent Vitals/ Pain       Pain Assessment Pain Assessment: Faces Faces Pain Scale: Hurts even more Pain Location: L wrist with ROM Pain Descriptors / Indicators: Discomfort, Grimacing, Guarding, Restless Pain Intervention(s): Monitored during session, Premedicated before session  Home Living                                          Prior Functioning/Environment              Frequency  Min 2X/week        Progress Toward Goals  OT Goals(current goals can now be found in the care plan section)  Progress towards OT goals: Progressing toward goals     Plan Discharge plan remains appropriate    Co-evaluation                 AM-PAC OT "6 Clicks" Daily Activity     Outcome Measure   Help from another person eating meals?: None Help from another person taking care of personal grooming?: None Help from another person toileting, which includes using toliet, bedpan, or urinal?: A Lot Help from another person bathing (including washing, rinsing, drying)?: A Lot Help from another person to put on and taking off regular upper body clothing?: None Help from another person to put on and taking off regular lower body clothing?: A Lot 6 Click Score: 18    End of Session    OT Visit Diagnosis: Muscle weakness (generalized) (M62.81);Pain   Activity Tolerance Patient tolerated treatment well   Patient Left in bed;with call bell/phone within reach   Nurse Communication Other (comment) (cleared patient to participate in session)        Time: 6599-3570 OT Time Calculation (min): 26 min  Charges: OT  General Charges $OT Visit: 1 Visit OT Treatments $Therapeutic Activity: 23-37 mins  Sharyn Blitz OTR/L, MS Acute Rehabilitation Department Office# [phone] Pager# [phone]   Ardyth Harps 01/09/2022, 4:07 PM

## 2022-01-09 NOTE — TOC Progression Note (Addendum)
Transition of Care [facility]) - Progression Note    Patient Details  Name: Jason Buchanan MRN: [id number] Date of Birth: [birth year]  Transition of [facility]) CM/SW [place], [place] Phone Number: 01/09/2022, 11:35 AM  Clinical Narrative:   Patient has been cleared by plastic surgery for d/c.  Continues with daily dressing changes, pain management via IV abx.  Goal is to get wean him off of IV and transition to PO this week.  Patient will be unable to go to any substance abuse treatment program with wounds, opiate meds prescribed.  Shelter remains only option. He is on the wait list at [facility]. He desires to follow up at [facility] for help with wound care, as well as [place]. TOC will continue to follow during the course of hospitalization.  Addendum:  Patient states he is aware that shelter is the only option.  He states he is on a wait list in [facility], and will call shelter in [place].  I will call [place] shelter to see if they might be an option.  Addendum II: Left message for director at [facility]. [phone]     Expected Discharge Plan: Homeless Shelter Barriers to Discharge: No Barriers Identified  Expected Discharge Plan and Services Expected Discharge Plan: Homeless Shelter   Discharge Planning Services: CM Consult   Living arrangements for the past 2 months: Homeless Shelter Expected Discharge Date: 11/14/21                 DME Agency: NA                   Social Determinants of Health (SDOH) Interventions    Readmission Risk Interventions No flowsheet data found.

## 2022-01-09 NOTE — Assessment & Plan Note (Deleted)
Seen by psychiatry this admission. -Continue Seroquel and Trazodone

## 2022-01-09 NOTE — Progress Notes (Deleted)
With the assistance of security - pts belongings ( backpack, and hospital provided bags) belongings bags were searched) after consulting with the [facility], cereal box with coloring utensils found, cigarette butt found and discarded, VAPE removed from personal belongings and put in pts chart ( grey & red- red mohito flavor)

## 2022-01-09 NOTE — Progress Notes (Signed)
PROGRESS NOTE    Jason Buchanan  [id number] DOB: 02-16-1986 DOA: 11/14/2021 PCP: [name], NP   Brief Narrative: Past medical history of polysubstance abuse presents with bilateral arm pain.  He has history of injecting multiple drugs in both arms.  CT scan showed upper extremity extensive cellulitis, placed on prednisone antibiotics.  He was admitted with working diagnosis of severe sepsis with right upper extremity cellulitis complicated with streptococcal bacteremia.  He is followed by multiple specialist ID plastic surgery.  Patient has required multiple I&D's in addition to placement of a wound VAC and application of skin graft x2.  Admission has been complicated by GI bleed secondary to esophagitis/gastritis, acute GI bleeding requiring 5 units of PRBC transfusion, significant narcotic use for management of pain.   Assessment and Plan: * Sepsis due to cellulitis (HCC)-resolved as of 01/09/2022, (present on admission) Secondary to bilateral upper extremity infections and bacteremia. -Please see problems: Streptococcal bacteremia Bacterial skin infection of upper extremity  Bacterial skin infection of upper extremity Initial CT imaging (11/14/22) consistent with right arm cellulitis. Patient underwent 4 I&D surgical procedures between orthopedic surgery and plastic surgery. Repeat CT imaging (12/04/21) concerning for abscess/pyomyositis of the right arm wound. Subsequently, patient has undergone 5 more surgical I&D procedures with application of a wound vac, in addition to graft placement (1/19). Patient has been managed on high-dose narcotic regimen of oxycodone 15 mg PO q3 hours PRN and Dilaudid 2 mg IV q3 hours PRN. Plastic surgery reevaluated patient's wounds on 2/5 secondary to concern for infection, however no evidence of active infection identified (likely pseudomonas colonization); no antibiotics restarted. -Continue Oxycodone PRN -Decrease to Dilaudid 1 mg IV q3 hours PRN  with plans for further de-escalation and eventual transition to oral monotherapy -Continue Flexeril PRN  Streptococcal bacteremia-resolved as of 01/09/2022 Blood cultures (11/14/2021) significant for Streptococcus pyogenes. Source of bacteremia is from patient's upper extremity skin infection. ID was consulted. Patient managed on Vancomycin/Ceftriaxone empirically for one day, was transitioned to Penicillin G for 3 days and finally transitioned to Ceftriaxone/Daptomycin followed by Daptomycin monotherapy; Daptomycin discontinued on 1/20 per ID recommendations. Transthoracic Echocardiogram (11/16/2021) significant for no overt evidence of valvular vegetation and ID recommendation for no Transesophageal Echocardiogram. Repeat blood cultures (11/16/2021) with no growth.  Protein-calorie malnutrition, severe Dietitian recommendations (1/25): Ensure Enlive po TID, each supplement provides 350 kcal and 20 grams of protein  Multivitamin with minerals daily 500 mg Vitamin daily Prosource Plus PO TID, each provides 100 kcals and 15g protein  Hepatitis C 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 Follow outpatient   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[place]) Seen by psychiatry this admission. -Continue Seroquel and Trazodone  Upper GI bleed Patient evaluated by GI. Upper endoscopy performed on 12/15 and was significant for gastric/duodenal ulcers in addition to esophagitis possibly related to NSAID use. Negative H. Pylori testing. GI recommendations (12/17) for Protonix BID x4 weeks with Protonix 40 mg daily for at least 6 months. Avoid NSAIDS -Transition to Protonix 40 mg daily  Acute blood loss anemia Secondary to upper GI bleeding. Patient has required 5 units of PRBC per transfusion. Associated iron deficiency anemia. -Continue iron supplementation  IVDU (intravenous drug user)- (present on admission) He's interested in [place] (recovery program) after discharge.  Encounter for  central line care-resolved as of 01/09/2022 IJ placed on 12/15. Removed 1/3.  Electrolyte abnormality-resolved as of 01/09/2022 Resolved.  Respiratory failure, post-operative (HCC)-resolved as of 01/09/2022 Required continued intubation post-procedure on 12/15 but eventually extubated successfully the same  day.    DVT prophylaxis: SCDs Code Status:   Code Status: Full Code Family Communication: None at bedside Disposition Plan: Difficult to place. Patient is currently not medically stable as his IV narcotics regimen must be tapered off.   Consultants:  Orthopedic surgery (Hand) Plastic surgery Gastroenterology PCCM Psychiatry  Procedures:  See assessment and plan  Antimicrobials: See assessment and plan    Subjective: No issues overnight.  Objective: Vitals:   01/07/22 1946 01/08/22 0543 01/08/22 2000 01/09/22 0536  BP: 115/79 118/65 131/82 (!) 143/86  Pulse: 97 98 (!) 104 (!) 109  Resp: 18 20 18 20   Temp: 98.2 F (36.8 C) 97.6 F (36.4 C) 98.3 F (36.8 C) 98.1 F (36.7 C)  TempSrc: Oral Oral Oral Oral  SpO2: 98% 97% 100% 99%  Weight:      Height:        Intake/Output Summary (Last 24 hours) at 01/09/2022 1436 Last data filed at 01/09/2022 1031 Gross per 24 hour  Intake 1062 ml  Output 900 ml  Net 162 ml   Filed Weights   11/15/21 1608 11/25/21 1342 12/29/21 1000  Weight: 60 kg 60 kg 78.2 kg    Examination:  General exam: Appears calm and comfortable Respiratory system: Clear to auscultation. Respiratory effort normal. Cardiovascular system: S1 & S2 heard, RRR. No murmurs, rubs, gallops or clicks. Gastrointestinal system: Abdomen is nondistended, soft and nontender. No organomegaly or masses felt. Normal bowel sounds heard. Central nervous system: Alert and oriented. No focal neurological deficits. Musculoskeletal: No edema. No calf tenderness Skin: No cyanosis. Psychiatry: Judgement and insight appear normal. Mood & affect appropriate.     Data  Reviewed: I have personally reviewed following labs and imaging studies  CBC Lab Results  Component Value Date   WBC 10.0 01/08/2022   RBC 4.23 01/08/2022   HGB 11.5 (L) 01/08/2022   HCT 36.8 (L) 01/08/2022   MCV 87.0 01/08/2022   MCH 27.2 01/08/2022   PLT 436 (H) 01/08/2022   MCHC 31.3 01/08/2022   RDW 15.8 (H) 01/08/2022   LYMPHSABS 2.4 12/07/2021   MONOABS 0.6 12/07/2021   EOSABS 0.7 (H) 12/07/2021   BASOSABS 0.1 [id number]     Last metabolic panel Lab Results  Component Value Date   NA 135 01/03/2022   K 4.0 01/03/2022   CL 100 01/03/2022   CO2 25 01/03/2022   BUN 19 01/03/2022   CREATININE 0.48 (L) 01/03/2022   GLUCOSE 113 (H) 01/03/2022   GFRNONAA >60 01/03/2022   GFRAA >60 11/09/2017   CALCIUM 9.1 01/03/2022   PHOS 4.7 (H) 12/08/2021   PROT 6.9 12/07/2021   ALBUMIN 2.5 (L) 12/07/2021   BILITOT 0.3 12/07/2021   ALKPHOS 89 12/07/2021   AST 18 12/07/2021   ALT 19 12/07/2021   ANIONGAP 10 01/03/2022    CBG (last 3)  No results for input(s): GLUCAP in the last 72 hours.   GFR: Estimated Creatinine Clearance: 120.5 mL/min (A) (by C-G formula based on SCr of 0.48 mg/dL (L)).  Coagulation Profile: No results for input(s): INR, PROTIME in the last 168 hours.  No results found for this or any previous visit (from the past 240 hour(s)).      Radiology Studies: No results found.      Scheduled Meds:  (feeding supplement) PROSource Plus  30 mL Oral TID BM   acetaminophen  500 mg Oral Q6H   vitamin C  500 mg Oral Daily   feeding supplement  237 mL Oral TID BM  ferrous sulfate  325 mg Oral Q breakfast   gabapentin  400 mg Oral TID   magnesium oxide  400 mg Oral BID   nicotine  21 mg Transdermal Daily   [START ON 01/10/2022] pantoprazole  40 mg Oral Daily   polyethylene glycol  17 g Oral BID   QUEtiapine  300 mg Oral QHS   Continuous Infusions:  lactated ringers Stopped (01/03/22 1745)     LOS: 56 days     Cordelia Poche, MD [facility] 01/09/2022, 2:36 PM  If 7PM-7AM, please contact night-coverage [email or web address]

## 2022-01-09 NOTE — Assessment & Plan Note (Addendum)
Secondary to bilateral upper extremity infections and bacteremia. -Please see problems: 1. Streptococcal bacteremia 2. Bacterial skin infection of upper extremity

## 2022-01-10 NOTE — Progress Notes (Addendum)
Occupational Therapy Treatment Patient Details Name: Jason Buchanan MRN: [id number] DOB: [birth year] Today's Date: 01/10/2022   History of present illness Pt is 36 yo male presented on 12/12 with bil UE edema/erythema after IV drug use.  Pt required R hand fasciotomy, carpal tunnel release, 3 compartment forearm fasciotomy, Guyon's canal release , and I and D hand/forearm on 12/15; and L wrist I and D, left volar forearm fasciotmoy, and L wrist carpal tunnel release on 12/13. On 12/14 concern for GIB and required 3 untis PRBC with ED 12/15. on 2/1 patient underwent skin graft on R forearm from R thigh.  Pt with hx of polysubstance abuse.   OT comments  Patient was educated on upgrade in ROM of RUE per Dr.Luppens. patient was able to participate in gentle ROM to RUE and LUE. Patient noted to have continued deficits with R digit extension with possible tendon shortening noted. Patient would continue to benefit from skilled OT services at this time while admitted and after d/c to address noted deficits in order to improve overall safety and independence in ADLs.     Recommendations for follow up therapy are one component of a multi-disciplinary discharge planning process, led by the attending physician.  Recommendations may be updated based on patient status, additional functional criteria and insurance authorization.    Follow Up Recommendations  Outpatient OT    Assistance Recommended at Discharge PRN  Patient can return home with the following  Assistance with cooking/housework;A lot of help with bathing/dressing/bathroom;Direct supervision/assist for medications management   Equipment Recommendations  None recommended by OT    Recommendations for Other Services      Precautions / Restrictions Precautions Precautions: Fall Precaution Comments: skin graft R forearm Restrictions Other Position/Activity Restrictions: Dr.Luppens 2/6 via secure chat- ok to start ranging freely.        Mobility   Balance    ADL either performed or assessed with clinical judgement    Praxis      Cognition Arousal/Alertness: Awake/alert Behavior During Therapy: WFL for tasks assessed/performed Overall Cognitive Status: Within Functional Limits for tasks assessed                                          Exercises Other Exercises Other Exercises: LUE: patient educated on adding supination and pronation exercises into daily HEP. patient was able to demonstrate 5 reps with grimancing noted with education on keeping elbow still provided for each rep. Other Exercises: patient tolerated AAROM of L wirst wit. AROM of digits on L hand with good finger opposition for first three digits not able on small digit. Other Exercises: PROM of R wirst was preformed on this date. patient able to extend wrist to neutral but unable to extend past this point. patient noted to have continued deficits with attempting to extend digits on R hand when wrist is in neutral.    Shoulder Instructions       General Comments Patient reporting pulling in R bicep this AM when preforming AROM. Dr.Luppens made aware via secure chat. no overt swelling noted or redness in area identified. noted to have some tight areas of skin over anterior elbow region with sutures on distal upper arm and proximal forearm pulling at sink. patietn still noted to use UE for self feeding during session with patient using it to move pillows and tray table around.    Pertinent Vitals/ Pain  Pain Assessment Pain Assessment: Faces Faces Pain Scale: Hurts even more Pain Location: BUE with ROM Pain Descriptors / Indicators: Discomfort, Grimacing, Guarding, Restless Pain Intervention(s): Limited activity within patient's tolerance, Monitored during session  Home Living                                          Prior Functioning/Environment              Frequency           Progress Toward  Goals  OT Goals(current goals can now be found in the care plan section)  Progress towards OT goals: Progressing toward goals     Plan Discharge plan remains appropriate    Co-evaluation                 AM-PAC OT "6 Clicks" Daily Activity     Outcome Measure   Help from another person eating meals?: None Help from another person taking care of personal grooming?: None Help from another person toileting, which includes using toliet, bedpan, or urinal?: A Little Help from another person bathing (including washing, rinsing, drying)?: A Little Help from another person to put on and taking off regular upper body clothing?: None Help from another person to put on and taking off regular lower body clothing?: A Little 6 Click Score: 21    End of Session    OT Visit Diagnosis: Muscle weakness (generalized) (M62.81);Pain   Activity Tolerance Patient tolerated treatment well   Patient Left in bed;with call bell/phone within reach   Nurse Communication Patient requests pain meds        Time: 5643-3295 OT Time Calculation (min): 25 min  Charges: OT General Charges $OT Visit: 1 Visit OT Treatments $Therapeutic Activity: 23-37 mins  Sharyn Blitz OTR/L, MS Acute Rehabilitation Department Office# [phone] Pager# [phone]   Ardyth Harps 01/10/2022, 4:07 PM

## 2022-01-10 NOTE — Progress Notes (Addendum)
Patient is using call light continuously throughout the night after the nurse and NT is assisting him with his needs. Patient requesting IV dilaudid more frequently and this nurse is reminding him of the orders to administer every 3 hours and he said he wanted dilaudid and not the oral. During the night he would wait to ring the call light as soon as the NT or myself closes the door after asking him if he needed anything else prior to leaving (beverages, snacks, emptying his urinal). The charge nurse notified me that the patient had called the desk requesting to speak with the [facility] because he was not getting his needs met. I informed the charge nurse of his behavior wanting meds early, denying the need for assistance prior to leaving the room, and his urinals were getting emptied with the output documented by myself.Will update on-coming nurse.

## 2022-01-10 NOTE — Progress Notes (Signed)
Nutrition Follow-up  DOCUMENTATION CODES:   Severe malnutrition in context of acute illness/injury  INTERVENTION:   -Ensure Enlive po TID, each supplement provides 350 kcal and 20 grams of protein    -Multivitamin with minerals daily   -500 mg Vitamin daily   -Prosource Plus PO TID, each provides 100 kcals and 15g protein  NUTRITION DIAGNOSIS:   Severe Malnutrition related to acute illness, wound healing as evidenced by severe fat depletion, severe muscle depletion.  Ongoing.  GOAL:   Patient will meet greater than or equal to 90% of their needs  Progressing.  MONITOR:   PO intake, Supplement acceptance, Labs, Weight trends, I & O's, Skin  ASSESSMENT:   36 year old man with history of Polysubstance abuse who presented to the hospital with arm swelling, erythema and pain. Patient is a poor historian but  reports history of injecting multiple drugs in both arms and currently receiving treatment for severe sepsis of right upper extremities complicated with streptococcus bacteremia.  12/12 admitted, NPO ->HH diet 12/13: NPO, s/p I&D of left wrist deep abscess, left volar forearm fasciotomy, left wrist carpal tunnel release 12/14: Regular ->NPO 12/15: s/p right hand fasciotomy, right hand carpal tunnel release, right forearm fasciotomy, I&D of hand and forearm, s/p EGD -esophagitis, gastric and duodenal ulcers 12/17: regular diet 12/20: I&D of BUEs 12/23: I&D of bilateral arms 12/29: I&D of bilateral arms w/ Wound VAC  1/4: Wound VAC change, I&D of deep abscess of rt arm 1/5: I&D of LUE, left hand  1/11: s/p debridement of rt arm, skin graft, wound VAC 1/19: s/p debridement, skin graft right arm, Wound VAC placed 1/26: s/p debridement of right arm, wound VAC placed  Patient consuming 100% of meals at this time. Accepting protein supplements and vitamins.  Admission weight: 132 lbs. Current weight: 172 lbs  I/Os:  -22L since 1/24 UOP: 4250 ml x 24  hrs  Medications: Vitamin C, Ferrous sulfate, MAG-OX, Miralax  Labs reviewed.  Diet Order:   Diet Order             Diet regular Room service appropriate? Yes; Fluid consistency: Thin  Diet effective now                   EDUCATION NEEDS:   Education needs have been addressed  Skin:  Skin Assessment: Skin Integrity Issues: Skin Integrity Issues:: Incisions Incisions: 12/13: bilateral arms  12/15: right arm 12/20 & 12/23: bilateral arms, 12/29: left arm, 1/4,1/11,1/19, 1/26: rt upper arm, 1/31: right leg  Last BM:  2/4  Height:   Ht Readings from Last 1 Encounters:  11/25/21 5\' 7"  (1.702 m)    Weight:   Wt Readings from Last 1 Encounters:  12/29/21 78.2 kg    BMI:  Body mass index is 27 kg/m.  Estimated Nutritional Needs:   Kcal:  1800-2000  Protein:  95-110g  Fluid:  2L/day  12/31/21, MS, RD, LDN Inpatient Clinical Dietitian Contact information available via Amion

## 2022-01-10 NOTE — Progress Notes (Addendum)
PROGRESS NOTE    Jason Buchanan  OBS:[id number] DOB: Jul 05, 1986 DOA: 11/14/2021 PCP: Lavinia Sharps, NP   Brief Narrative: Past medical history of polysubstance abuse presents with bilateral arm pain.  He has history of injecting multiple drugs in both arms.  CT scan showed upper extremity extensive cellulitis, placed on prednisone antibiotics.  He was admitted with working diagnosis of severe sepsis with right upper extremity cellulitis complicated with streptococcal bacteremia.  He is followed by multiple specialist ID plastic surgery.  Patient has required multiple I&D's in addition to placement of a wound VAC and application of skin graft x2.  Admission has been complicated by GI bleed secondary to esophagitis/gastritis, acute GI bleeding requiring 5 units of PRBC transfusion, significant narcotic use for management of pain.   Assessment and Plan: * Sepsis due to cellulitis (HCC)-resolved as of 01/09/2022, (present on admission) Secondary to bilateral upper extremity infections and bacteremia. -Please see problems: Streptococcal bacteremia Bacterial skin infection of upper extremity  Bacterial skin infection of upper extremity Initial CT imaging (11/14/22) consistent with right arm cellulitis. Patient underwent 4 I&D surgical procedures between orthopedic surgery and plastic surgery. Repeat CT imaging (12/04/21) concerning for abscess/pyomyositis of the right arm wound. Subsequently, patient has undergone 5 more surgical I&D procedures with application of a wound vac, in addition to graft placement (1/19). Patient has been managed on high-dose narcotic regimen of oxycodone 15 mg PO q3 hours PRN and Dilaudid 2 mg IV q3 hours PRN. Plastic surgery reevaluated patient's wounds on 2/5 secondary to concern for infection, however no evidence of active infection identified (likely pseudomonas colonization); no antibiotics restarted. -Continue Oxycodone PRN -Continued decreased dose of Dilaudid 1 mg  IV q3 hours PRN with plans for further de-escalation and eventual transition to oral monotherapy -Continue Flexeril PRN -[facility] contacted on 2/6 for reevaluation of surgical site (left hand/wrist). Contacted again on 2/7 -UPDATE: received call stating Dr. Izora Ribas is the primary contact for evaluation/management of left hand/wrist surgical site.D r. Coley's office contacted on 2/7)  Streptococcal bacteremia-resolved as of 01/09/2022 Blood cultures (11/14/2021) significant for Streptococcus pyogenes. Source of bacteremia is from patient's upper extremity skin infection. ID was consulted. Patient managed on Vancomycin/Ceftriaxone empirically for one day, was transitioned to Penicillin G for 3 days and finally transitioned to Ceftriaxone/Daptomycin followed by Daptomycin monotherapy; Daptomycin discontinued on 1/20 per ID recommendations. Transthoracic Echocardiogram (11/16/2021) significant for no overt evidence of valvular vegetation and ID recommendation for no Transesophageal Echocardiogram. Repeat blood cultures (11/16/2021) with no growth.  Protein-calorie malnutrition, severe Dietitian recommendations (1/25): Ensure Enlive po TID, each supplement provides 350 kcal and 20 grams of protein  Multivitamin with minerals daily 500 mg Vitamin daily Prosource Plus PO TID, each provides 100 kcals and 15g protein  Hepatitis C 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 Follow outpatient   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HCC) Seen by psychiatry this admission. -Continue Seroquel and Trazodone  Upper GI bleed Patient evaluated by GI. Upper endoscopy performed on 12/15 and was significant for gastric/duodenal ulcers in addition to esophagitis possibly related to NSAID use. Negative H. Pylori testing. GI recommendations (12/17) for Protonix BID x4 weeks with Protonix 40 mg daily for at least 6 months. Avoid NSAIDS -Continue Protonix 40 mg daily (decreased from BID dosing per GI  recommendations)  Acute blood loss anemia Secondary to upper GI bleeding. Patient has required 5 units of PRBC per transfusion. Associated iron deficiency anemia. -Continue iron supplementation  IVDU (intravenous drug user)- (present on admission) He's interested in [facility] (  recovery program) after discharge.  Encounter for central line care-resolved as of 01/09/2022 IJ placed on 12/15. Removed 1/3.  Electrolyte abnormality-resolved as of 01/09/2022 Resolved.  Respiratory failure, post-operative (HCC)-resolved as of 01/09/2022 Required continued intubation post-procedure on 12/15 but eventually extubated successfully the same day.    DVT prophylaxis: SCDs Code Status:   Code Status: Full Code Family Communication: None at bedside Disposition Plan: Difficult to place. Patient is currently not medically stable as his IV narcotics regimen must be tapered off.   Consultants:  Orthopedic surgery (Hand) Plastic surgery Gastroenterology PCCM Psychiatry  Procedures:  See assessment and plan  Antimicrobials: See assessment and plan    Subjective: No concerns overnight. States his sister has also called the orthopedic surgeon's office to request reevaluation of surgical wound.  Objective: Vitals:   01/09/22 0536 01/09/22 1546 01/09/22 1930 01/10/22 0254  BP: (!) 143/86 122/82 (!) 146/85 (!) 128/52  Pulse: (!) 109 (!) 108 (!) 102 (!) 110  Resp: 20 17 18 18   Temp: 98.1 F (36.7 C) 98.3 F (36.8 C) 97.7 F (36.5 C) 98.5 F (36.9 C)  TempSrc: Oral Oral Oral Oral  SpO2: 99% 98% 100% 99%  Weight:      Height:        Intake/Output Summary (Last 24 hours) at 01/10/2022 1259 Last data filed at 01/10/2022 0825 Gross per 24 hour  Intake 1450 ml  Output 4050 ml  Net -2600 ml    Filed Weights   11/15/21 1608 11/25/21 1342 12/29/21 1000  Weight: 60 kg 60 kg 78.2 kg    Examination:  General exam: Appears calm and comfortable Respiratory system: Clear to auscultation.  Respiratory effort normal. Cardiovascular system: S1 & S2 heard, RRR. Gastrointestinal system: Abdomen is nondistended, soft and nontender. No organomegaly or masses felt. Normal bowel sounds heard. Central nervous system: Alert and oriented. No focal neurological deficits. Musculoskeletal: No edema. No calf tenderness Psychiatry: Judgement and insight appear normal. Mood & affect appropriate.    Data Reviewed: I have personally reviewed following labs and imaging studies  CBC Lab Results  Component Value Date   WBC 10.0 01/08/2022   RBC 4.23 01/08/2022   HGB 11.5 (L) 01/08/2022   HCT 36.8 (L) 01/08/2022   MCV 87.0 01/08/2022   MCH 27.2 01/08/2022   PLT 436 (H) 01/08/2022   MCHC 31.3 01/08/2022   RDW 15.8 (H) 01/08/2022   LYMPHSABS 2.4 12/07/2021   MONOABS 0.6 12/07/2021   EOSABS 0.7 (H) 12/07/2021   BASOSABS 0.1 [id number]     Last metabolic panel Lab Results  Component Value Date   NA 135 01/03/2022   K 4.0 01/03/2022   CL 100 01/03/2022   CO2 25 01/03/2022   BUN 19 01/03/2022   CREATININE 0.48 (L) 01/03/2022   GLUCOSE 113 (H) 01/03/2022   GFRNONAA >60 01/03/2022   GFRAA >60 11/09/2017   CALCIUM 9.1 01/03/2022   PHOS 4.7 (H) 12/08/2021   PROT 6.9 12/07/2021   ALBUMIN 2.5 (L) 12/07/2021   BILITOT 0.3 12/07/2021   ALKPHOS 89 12/07/2021   AST 18 12/07/2021   ALT 19 12/07/2021   ANIONGAP 10 01/03/2022    CBG (last 3)  No results for input(s): GLUCAP in the last 72 hours.   GFR: Estimated Creatinine Clearance: 120.5 mL/min (A) (by C-G formula based on SCr of 0.48 mg/dL (L)).  Coagulation Profile: No results for input(s): INR, PROTIME in the last 168 hours.  No results found for this or any previous visit (from the past  240 hour(s)).      Radiology Studies: No results found.      Scheduled Meds:  (feeding supplement) PROSource Plus  30 mL Oral TID BM   acetaminophen  500 mg Oral Q6H   vitamin C  500 mg Oral Daily   feeding supplement  237 mL  Oral TID BM   ferrous sulfate  325 mg Oral Q breakfast   gabapentin  400 mg Oral TID   magnesium oxide  400 mg Oral BID   nicotine  21 mg Transdermal Daily   pantoprazole  40 mg Oral Daily   polyethylene glycol  17 g Oral BID   QUEtiapine  300 mg Oral QHS   Continuous Infusions:  lactated ringers Stopped (01/03/22 1745)     LOS: 14 days     Cordelia Poche, MD [facility] 01/10/2022, 12:59 PM  If 7PM-7AM, please contact night-coverage [email or web address]

## 2022-01-10 NOTE — TOC Progression Note (Addendum)
Transition of Care [facility]) - Progression Note    Patient Details  Name: Jason Buchanan MRN: [id number] Date of Birth: 08-27-1986  Transition of Care [facility]) CM/SW [place], [place] Phone Number: 01/10/2022, 9:00 AM  Clinical Narrative:   Received call from Mr Terance Hart at [facility] with [employer] program wanting to establish contact with patient for disability application process.  Gave him patient's room phone number. Left another message for Ms Jarvis Newcomer at [employer] in [place] [address] [place]. Addendum: Ms Coral Spikes, case manager,  states there are no current openings.  She was unclear as to when they would have openings, and could not say definitively as to whether they could take patient.  She transferred me to Mr Aldine Contes, [employer], and left a message for him.   Followed up with [facility].  Patient is not eligible based on wound care needs and oral pain medication.  Eligible when wounds are healed and on no pain meds. Addendum:  Call back from Mr Aldine Contes.  Patient is now on wait list for [facility].  Once his name comes up, they will make final determination about whether they could manage him medically.  Expected Discharge Plan: Homeless Shelter Barriers to Discharge: No Barriers Identified  Expected Discharge Plan and Services Expected Discharge Plan: Homeless Shelter   Discharge Planning Services: CM Consult   Living arrangements for the past 2 months: Homeless Shelter Expected Discharge Date: 11/14/21                 DME Agency: NA                   Social Determinants of Health (SDOH) Interventions    Readmission Risk Interventions No flowsheet data found.

## 2022-01-11 LAB — CBC
HCT: 39.9 % (ref 39.0–52.0)
Hemoglobin: 12.6 g/dL — ABNORMAL LOW (ref 13.0–17.0)
MCH: 27.5 pg (ref 26.0–34.0)
MCHC: 31.6 g/dL (ref 30.0–36.0)
MCV: 86.9 fL (ref 80.0–100.0)
Platelets: 411 10*3/uL — ABNORMAL HIGH (ref 150–400)
RBC: 4.59 MIL/uL (ref 4.22–5.81)
RDW: 15.8 % — ABNORMAL HIGH (ref 11.5–15.5)
WBC: 9.3 10*3/uL (ref 4.0–10.5)
nRBC: 0 % (ref 0.0–0.2)

## 2022-01-11 LAB — BASIC METABOLIC PANEL
Anion gap: 10 (ref 5–15)
BUN: 21 mg/dL — ABNORMAL HIGH (ref 6–20)
CO2: 22 mmol/L (ref 22–32)
Calcium: 9.2 mg/dL (ref 8.9–10.3)
Chloride: 102 mmol/L (ref 98–111)
Creatinine, Ser: 0.47 mg/dL — ABNORMAL LOW (ref 0.61–1.24)
GFR, Estimated: >60 mL/min (ref >60)
Glucose, Bld: 107 mg/dL — ABNORMAL HIGH (ref 70–99)
Potassium: 4 mmol/L (ref 3.5–5.1)
Sodium: 134 mmol/L — ABNORMAL LOW (ref 135–145)

## 2022-01-11 MED ORDER — METHOCARBAMOL 1000 MG/10ML IJ SOLN
500.0000 mg | Freq: Three times a day (TID) | INTRAVENOUS | Status: DC | PRN
  Administered 2022-01-11 – 2022-01-16 (×10): 500 mg via INTRAVENOUS
  Filled 2022-01-11 (×2): qty 5
  Filled 2022-01-11 (×3): qty 500
  Filled 2022-01-11: qty 5
  Filled 2022-01-11 (×2): qty 500
  Filled 2022-01-11 (×2): qty 5
  Filled 2022-01-11: qty 500
  Filled 2022-01-11: qty 5
  Filled 2022-01-11: qty 500

## 2022-01-11 NOTE — Plan of Care (Signed)
  Problem: Education: Goal: Knowledge of General Education information will improve Description: Including pain rating scale, medication(s)/side effects and non-pharmacologic comfort measures Outcome: Progressing   Problem: Health Behavior/Discharge Planning: Goal: Ability to manage health-related needs will improve Outcome: Progressing   Problem: Clinical Measurements: Goal: Ability to maintain clinical measurements within normal limits will improve Outcome: Progressing Goal: Will remain free from infection Outcome: Progressing Goal: Diagnostic test results will improve Outcome: Progressing Goal: Respiratory complications will improve Outcome: Progressing Goal: Cardiovascular complication will be avoided Outcome: Progressing   Problem: Activity: Goal: Risk for activity intolerance will decrease Outcome: Progressing   Problem: Nutrition: Goal: Adequate nutrition will be maintained Outcome: Progressing   Problem: Coping: Goal: Level of anxiety will decrease Outcome: Progressing   Problem: Elimination: Goal: Will not experience complications related to bowel motility Outcome: Progressing Goal: Will not experience complications related to urinary retention Outcome: Progressing   Problem: Pain Managment: Goal: General experience of comfort will improve Outcome: Progressing   Problem: Safety: Goal: Ability to remain free from injury will improve Outcome: Progressing   Problem: Skin Integrity: Goal: Risk for impaired skin integrity will decrease Outcome: Progressing   Problem: Fluid Volume: Goal: Hemodynamic stability will improve Outcome: Progressing   Problem: Clinical Measurements: Goal: Diagnostic test results will improve Outcome: Progressing Goal: Signs and symptoms of infection will decrease Outcome: Progressing   Problem: Respiratory: Goal: Ability to maintain adequate ventilation will improve Outcome: Progressing   Problem: Education: Goal:  Knowledge of General Education information will improve Description: Including pain rating scale, medication(s)/side effects and non-pharmacologic comfort measures Outcome: Progressing   Problem: Health Behavior/Discharge Planning: Goal: Ability to manage health-related needs will improve Outcome: Progressing   Problem: Clinical Measurements: Goal: Ability to maintain clinical measurements within normal limits will improve Outcome: Progressing Goal: Will remain free from infection Outcome: Progressing Goal: Diagnostic test results will improve Outcome: Progressing Goal: Respiratory complications will improve Outcome: Progressing Goal: Cardiovascular complication will be avoided Outcome: Progressing   Problem: Activity: Goal: Risk for activity intolerance will decrease Outcome: Progressing   Problem: Nutrition: Goal: Adequate nutrition will be maintained Outcome: Progressing   Problem: Coping: Goal: Level of anxiety will decrease Outcome: Progressing   Problem: Elimination: Goal: Will not experience complications related to bowel motility Outcome: Progressing Goal: Will not experience complications related to urinary retention Outcome: Progressing   Problem: Pain Managment: Goal: General experience of comfort will improve Outcome: Progressing   Problem: Safety: Goal: Ability to remain free from injury will improve Outcome: Progressing   Problem: Skin Integrity: Goal: Risk for impaired skin integrity will decrease Outcome: Progressing   

## 2022-01-11 NOTE — Progress Notes (Signed)
Progress Note   Patient: Jason Buchanan [id number] DOB: 10-16-1986 DOA: 11/14/2021     58 DOS: the patient was seen and examined on 01/11/2022   Brief hospital course: Past medical history of polysubstance abuse presents with bilateral arm pain.  He has history of injecting multiple drugs in both arms.  CT scan showed upper extremity extensive cellulitis, placed on prednisone antibiotics.  He was admitted with working diagnosis of severe sepsis with right upper extremity cellulitis complicated with streptococcal bacteremia.  He is followed by multiple specialist ID plastic surgery.  Patient has required multiple I&D's in addition to placement of a wound VAC and application of skin graft x2.  Admission has been complicated by GI bleed secondary to esophagitis/gastritis, acute GI bleeding requiring 5 units of PRBC transfusion, significant narcotic use for management of pain.  Assessment and Plan: * Sepsis due to cellulitis (HCC)-resolved as of 01/09/2022, (present on admission) Secondary to bilateral upper extremity infections and bacteremia. -Please see problems: Streptococcal bacteremia Bacterial skin infection of upper extremity  Bacterial skin infection of upper extremity Initial CT imaging (11/14/22) consistent with right arm cellulitis. Patient underwent 4 I&D surgical procedures between orthopedic surgery and plastic surgery. Repeat CT imaging (12/04/21) concerning for abscess/pyomyositis of the right arm wound. Subsequently, patient has undergone 5 more surgical I&D procedures with application of a wound vac, in addition to graft placement (1/19). Patient has been managed on high-dose narcotic regimen of oxycodone 15 mg PO q3 hours PRN and Dilaudid 2 mg IV q3 hours PRN. Plastic surgery reevaluated patient's wounds on 2/5 secondary to concern for infection, however no evidence of active infection identified (likely pseudomonas colonization); no antibiotics restarted. -Continue Oxycodone  PRN -Continued decreased dose of Dilaudid 1 mg IV q3 hours PRN with plans for further de-escalation and eventual transition to oral monotherapy -Continue Flexeril PRN -[facility] contacted on 2/7 -Have changed flexeril to IV robaxin for additional pain relief  Protein-calorie malnutrition, severe Dietitian recommendations (1/25): Ensure Enlive po TID, each supplement provides 350 kcal and 20 grams of protein  Multivitamin with minerals daily 500 mg Vitamin daily Prosource Plus PO TID, each provides 100 kcals and 15g protein  Hepatitis C 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 Follow outpatient   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 -Continue Seroquel and Trazodone  Upper GI bleed Patient evaluated by GI. Upper endoscopy performed on 12/15 and was significant for gastric/duodenal ulcers in addition to esophagitis possibly related to NSAID use. Negative H. Pylori testing. GI recommendations (12/17) for Protonix BID x4 weeks with Protonix 40 mg daily for at least 6 months. Avoid NSAIDS -Continue Protonix 40 mg daily (decreased from BID dosing per GI recommendations)  Acute blood loss anemia Secondary to upper GI bleeding. Patient has required 5 units of PRBC per transfusion. Associated iron deficiency anemia. -Continue iron supplementation -Hgb remains stable  IVDU (intravenous drug user)- (present on admission) He's interested in [place] (recovery program) after discharge.  Encounter for central line care-resolved as of 01/09/2022 IJ placed on 12/15. Removed 1/3.  Electrolyte abnormality-resolved as of 01/09/2022 Resolved.  Respiratory failure, post-operative (HCC)-resolved as of 01/09/2022 Required continued intubation post-procedure on 12/15 but eventually extubated successfully the same day.  Streptococcal bacteremia-resolved as of 01/09/2022 Blood cultures (11/14/2021) significant for Streptococcus pyogenes. Source of  bacteremia is from patient's upper extremity skin infection. ID was consulted. Patient managed on Vancomycin/Ceftriaxone empirically for one day, was transitioned to Penicillin G for 3 days and finally transitioned to Ceftriaxone/Daptomycin followed by Daptomycin  monotherapy; Daptomycin discontinued on 1/20 per ID recommendations. Transthoracic Echocardiogram (11/16/2021) significant for no overt evidence of valvular vegetation and ID recommendation for no Transesophageal Echocardiogram. Repeat blood cultures (11/16/2021) with no growth.        Subjective: Complaining of continued pain at skin graft sites  Physical Exam: Vitals:   01/10/22 1717 01/10/22 1935 01/11/22 0448 01/11/22 1346  BP: (!) 158/82 (!) 155/79 130/68 137/66  Pulse: (!) 102 (!) 101 97 97  Resp: 20 18 20 19   Temp: 98.2 F (36.8 C) 98.7 F (37.1 C) (!) 97.5 F (36.4 C) 98.5 F (36.9 C)  TempSrc:  Oral Oral   SpO2: 98% 99% 97% 98%  Weight:      Height:       General exam: Awake, laying in bed, in nad Respiratory system: Normal respiratory effort, no wheezing Cardiovascular system: regular rate, s1, s2 Gastrointestinal system: Soft, nondistended, positive BS Central nervous system: CN2-12 grossly intact, strength intact Extremities: Perfused, no clubbing Skin: Normal skin turgor, no notable skin lesions seen, skin graft sites without redness Psychiatry: Mood normal // no visual hallucinations   Data Reviewed:  Labs reviewed  Family Communication: Pt in room, family not at bedside  Disposition: Status is: Inpatient  Remains inpatient appropriate because: Severity of illness and dispo planning    Planned Discharge Destination: Barriers to discharge: difficult dispo planning      Author: Marylu Lund, MD 01/11/2022 4:10 PM  For on call review [email or web address].

## 2022-01-12 ENCOUNTER — Inpatient Hospital Stay (HOSPITAL_COMMUNITY): Payer: 59

## 2022-01-12 NOTE — Progress Notes (Signed)
Progress Note   Patient: Jason Buchanan [id number] DOB: [birth year] DOA: 11/14/2021     59 DOS: the patient was seen and examined on 01/12/2022   Brief hospital course: Past medical history of polysubstance abuse presents with bilateral arm pain.  He has history of injecting multiple drugs in both arms.  CT scan showed upper extremity extensive cellulitis, placed on prednisone antibiotics.  He was admitted with working diagnosis of severe sepsis with right upper extremity cellulitis complicated with streptococcal bacteremia.  He is followed by multiple specialist ID plastic surgery.  Patient has required multiple I&D's in addition to placement of a wound VAC and application of skin graft x2.  Admission has been complicated by GI bleed secondary to esophagitis/gastritis, acute GI bleeding requiring 5 units of PRBC transfusion, significant narcotic use for management of pain.  Assessment and Plan: * Sepsis due to cellulitis (HCC)-resolved as of 01/09/2022, (present on admission) Secondary to bilateral upper extremity infections and bacteremia. -Please see problems: Streptococcal bacteremia Bacterial skin infection of upper extremity  Bacterial skin infection of upper extremity Initial CT imaging (11/14/22) consistent with right arm cellulitis. Patient underwent 4 I&D surgical procedures between orthopedic surgery and plastic surgery. Repeat CT imaging (12/04/21) concerning for abscess/pyomyositis of the right arm wound. Subsequently, patient has undergone 5 more surgical I&D procedures with application of a wound vac, in addition to graft placement (1/19). Patient has been managed on high-dose narcotic regimen of oxycodone 15 mg PO q3 hours PRN and Dilaudid 2 mg IV q3 hours PRN. Plastic surgery reevaluated patient's wounds on 2/5 secondary to concern for infection, however no evidence of active infection identified (likely pseudomonas colonization); no antibiotics restarted. -Continue Oxycodone  PRN -Continued decreased dose of Dilaudid 1 mg IV q3 hours PRN with plans for further de-escalation and eventual transition to oral monotherapy -Continue Flexeril PRN -[facility] contacted on 2/7 -Have changed flexeril to IV robaxin for additional pain relief -Pt continued to complain of R forearm pain. Ordered and reviewed R forearm xray. Findings concerning for metallic foreign body in soft tissue under skin graft site. Have reached out to Plastic Surgery to discuss, awaiting reply  Protein-calorie malnutrition, severe Dietitian recommendations (1/25): Ensure Enlive po TID, each supplement provides 350 kcal and 20 grams of protein  Multivitamin with minerals daily 500 mg Vitamin daily Prosource Plus PO TID, each provides 100 kcals and 15g protein  Hepatitis C 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 Follow outpatient   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[place]) Seen by psychiatry this admission. -Continue Seroquel and Trazodone  Upper GI bleed Patient evaluated by GI. Upper endoscopy performed on 12/15 and was significant for gastric/duodenal ulcers in addition to esophagitis possibly related to NSAID use. Negative H. Pylori testing. GI recommendations (12/17) for Protonix BID x4 weeks with Protonix 40 mg daily for at least 6 months. Avoid NSAIDS -Continue Protonix 40 mg daily (decreased from BID dosing per GI recommendations)  Acute blood loss anemia Secondary to upper GI bleeding. Patient has required 5 units of PRBC per transfusion. Associated iron deficiency anemia. -Continue iron supplementation -Hgb remains stable  IVDU (intravenous drug user)- (present on admission) He's interested in [place] (recovery program) after discharge.  Encounter for central line care-resolved as of 01/09/2022 IJ placed on 12/15. Removed 1/3.  Electrolyte abnormality-resolved as of 01/09/2022 Resolved.  Respiratory failure, post-operative (HCC)-resolved as of 01/09/2022 Required  continued intubation post-procedure on 12/15 but eventually extubated successfully the same day.  Streptococcal bacteremia-resolved as of 01/09/2022 Blood cultures (11/14/2021) significant for Streptococcus pyogenes.  Source of bacteremia is from patient's upper extremity skin infection. ID was consulted. Patient managed on Vancomycin/Ceftriaxone empirically for one day, was transitioned to Penicillin G for 3 days and finally transitioned to Ceftriaxone/Daptomycin followed by Daptomycin monotherapy; Daptomycin discontinued on 1/20 per ID recommendations. Transthoracic Echocardiogram (11/16/2021) significant for no overt evidence of valvular vegetation and ID recommendation for no Transesophageal Echocardiogram. Repeat blood cultures (11/16/2021) with no growth.     Subjective: Still complaining of pain in R forearm with drainage  Physical Exam: Vitals:   01/11/22 0448 01/11/22 1346 01/11/22 1948 01/12/22 0338  BP: 130/68 137/66 (!) 143/80 (!) 156/73  Pulse: 97 97 (!) 106 97  Resp: 20 19 16 14   Temp: (!) 97.5 F (36.4 C) 98.5 F (36.9 C) 98.3 F (36.8 C) 98.1 F (36.7 C)  TempSrc: Oral  Oral Oral  SpO2: 97% 98% 98% 96%  Weight:      Height:       General exam: Conversant, in no acute distress Respiratory system: normal chest rise, clear, no audible wheezing Cardiovascular system: regular rhythm, s1-s2 Gastrointestinal system: Nondistended, nontender, pos BS Central nervous system: No seizures, no tremors Extremities: No cyanosis, no joint deformities Skin: No rashes, no pallor Psychiatry: Affect normal // no auditory hallucinations   Data Reviewed:  Labs reviewed  Family Communication: Pt in room, family not at bedside  Disposition: Status is: Inpatient  Remains inpatient appropriate because: Severity of illness and dispo planning    Planned Discharge Destination: Barriers to discharge: difficult dispo planning      Author: Marylu Lund, MD 01/12/2022 3:17 PM  For  on call review [email or web address].

## 2022-01-12 NOTE — Progress Notes (Signed)
Discussed finding of small linear metallic density over the radius with Dr. Wyline Copas.  We discussed that if this was a needle or other small foreign body of this gauge I would be unlikely to explore his forearm to avoid damage to surrounding tissue and it seems unlikely to cause symptoms given the diameter but we can also discuss the finding with Dr. Lenon Curt who treated him at the time of his initial infection from injecting.

## 2022-01-13 NOTE — Plan of Care (Signed)
Plan of care reviewed and discussed with the patient. 

## 2022-01-13 NOTE — Progress Notes (Signed)
Occupational Therapy Treatment Patient Details Name: Jason Buchanan MRN: [id number] DOB: 02/27/1986 Today's Date: 01/13/2022   History of present illness Pt is 36 yo male presented on 12/12 with bil UE edema/erythema after IV drug use.  Pt required R hand fasciotomy, carpal tunnel release, 3 compartment forearm fasciotomy, Guyon's canal release , and I and D hand/forearm on 12/15; and L wrist I and D, left volar forearm fasciotmoy, and L wrist carpal tunnel release on 12/13. On 12/14 concern for GIB and required 3 untis PRBC with ED 12/15. on 2/1 patient underwent skin graft on R forearm from R thigh.  Pt with hx of polysubstance abuse.   OT comments  Patient continues to have deficits in ROM of fingers and wrist bilaterally. His left hand is more functional but reports still using right hand predominantly. Patient lacking MCP flexion in left hand limiting his functional grasp strength and exhibits impaired activation of both intrinsic and extrinsic muscles in right hand. Therapist encouraged patient to continue to stretch his wrist and digits as much as possible. He verbalizes understanding but reports pain limits him from being too aggressive. OP OT or hand therapy is recommended at discharge.   Recommendations for follow up therapy are one component of a multi-disciplinary discharge planning process, led by the attending physician.  Recommendations may be updated based on patient status, additional functional criteria and insurance authorization.    Follow Up Recommendations  Outpatient OT    Assistance Recommended at Discharge PRN  Patient can return home with the following  Assistance with cooking/housework;A lot of help with bathing/dressing/bathroom;Direct supervision/assist for medications management   Equipment Recommendations  None recommended by OT    Recommendations for Other Services      Precautions / Restrictions Precautions Precautions: Fall Precaution Comments: skin graft  R forearm Restrictions Other Position/Activity Restrictions: Dr.Luppens 2/6 via secure chat- ok to start ranging freely.         Extremity/Trunk Assessment Upper Extremity Assessment RUE Deficits / Details: draining wound on forearm with dressing placed over it, wrist ROM at neutral, thumb grossly functional ROM, index finger has gross functional ROM and patient can grossly flex and extend, patient unable to flex 3rd and 4th digit, able to grossly flex 5thd igit, appears to have intrinsic and extrinsic deficits RUE Sensation: decreased light touch RUE Coordination: decreased fine motor;decreased gross motor LUE Deficits / Details: continues to have staples inforearm, able to slightly extend wrist but he is reporting a lot of pain. Still lacking MCP flexion limiting the strength of his grasp LUE Sensation: decreased light touch LUE Coordination: decreased fine motor;decreased gross motor             Cognition Arousal/Alertness: Awake/alert Behavior During Therapy: WFL for tasks assessed/performed Overall Cognitive Status: Within Functional Limits for tasks assessed                                                     Pertinent Vitals/ Pain       Pain Assessment Pain Assessment: Faces Faces Pain Scale: Hurts whole lot Pain Location: with stretching of digits and wrist Pain Descriptors / Indicators: Discomfort, Grimacing, Guarding, Restless Pain Intervention(s): Patient requesting pain meds-RN notified   Frequency  Min 2X/week        Progress Toward Goals  OT Goals(current goals can now be found in  the care plan section)  Progress towards OT goals: Progressing toward goals  Acute Rehab OT Goals OT Goal Formulation: With patient Time For Goal Achievement: 01/27/22 Potential to Achieve Goals: Fair  Plan Discharge plan remains appropriate    Co-evaluation                 AM-PAC OT "6 Clicks" Daily Activity     Outcome Measure   Help  from another person eating meals?: None Help from another person taking care of personal grooming?: None Help from another person toileting, which includes using toliet, bedpan, or urinal?: None Help from another person bathing (including washing, rinsing, drying)?: None Help from another person to put on and taking off regular upper body clothing?: None Help from another person to put on and taking off regular lower body clothing?: None 6 Click Score: 24    End of Session    OT Visit Diagnosis: Muscle weakness (generalized) (M62.81);Pain   Activity Tolerance Patient tolerated treatment well   Patient Left in bed;with call bell/phone within reach   Nurse Communication Patient requests pain meds        Time: 1420-1444 OT Time Calculation (min): 24 min  Charges: OT General Charges $OT Visit: 1 Visit OT Treatments $Therapeutic Exercise: 23-37 mins  Carolie Mcilrath, OTR/L Acute Care Rehab Services  Office [phone] Pager: [phone]   Kelli Churn 01/13/2022, 3:04 PM

## 2022-01-13 NOTE — Progress Notes (Signed)
Progress Note   Patient: Jason Buchanan [id number] DOB: [birth year] DOA: 11/14/2021     60 DOS: the patient was seen and examined on 01/13/2022   Brief hospital course: Past medical history of polysubstance abuse presents with bilateral arm pain.  He has history of injecting multiple drugs in both arms.  CT scan showed upper extremity extensive cellulitis, placed on prednisone antibiotics.  He was admitted with working diagnosis of severe sepsis with right upper extremity cellulitis complicated with streptococcal bacteremia.  He is followed by multiple specialist ID plastic surgery.  Patient has required multiple I&D's in addition to placement of a wound VAC and application of skin graft x2.  Admission has been complicated by GI bleed secondary to esophagitis/gastritis, acute GI bleeding requiring 5 units of PRBC transfusion, significant narcotic use for management of pain.  Assessment and Plan: * Sepsis due to cellulitis (HCC)-resolved as of 01/09/2022, (present on admission) Secondary to bilateral upper extremity infections and bacteremia. -Please see problems: Streptococcal bacteremia Bacterial skin infection of upper extremity  Bacterial skin infection of upper extremity Initial CT imaging (11/14/22) consistent with right arm cellulitis. Patient underwent 4 I&D surgical procedures between orthopedic surgery and plastic surgery. Repeat CT imaging (12/04/21) concerning for abscess/pyomyositis of the right arm wound. Subsequently, patient has undergone 5 more surgical I&D procedures with application of a wound vac, in addition to graft placement (1/19). Patient has been managed on high-dose narcotic regimen of oxycodone 15 mg PO q3 hours PRN and Dilaudid 2 mg IV q3 hours PRN. Plastic surgery reevaluated patient's wounds on 2/5 secondary to concern for infection, however no evidence of active infection identified (likely pseudomonas colonization); no antibiotics restarted. -Continue Oxycodone  PRN -Continued decreased dose of Dilaudid 1 mg IV q3 hours PRN with plans for further de-escalation and eventual transition to oral monotherapy -Continue Flexeril PRN -[facility] contacted on 2/7 -Have changed flexeril to IV robaxin for additional pain relief -Pt continued to complain of R forearm pain. Ordered and reviewed R forearm xray. Findings concerning for metallic foreign body in soft tissue under skin graft site.  -Discussed with Plastic Surgery. Appreciate input. Given small size of object, surgery is unlikelly able to explore forearm without damaging surrounding tissue, thus no indication for surgery  Protein-calorie malnutrition, severe Dietitian recommendations (1/25): Ensure Enlive po TID, each supplement provides 350 kcal and 20 grams of protein  Multivitamin with minerals daily 500 mg Vitamin daily Prosource Plus PO TID, each provides 100 kcals and 15g protein  Hepatitis C 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 Follow outpatient   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[place]) Seen by psychiatry this admission. -Continue Seroquel and Trazodone  Upper GI bleed Patient evaluated by GI. Upper endoscopy performed on 12/15 and was significant for gastric/duodenal ulcers in addition to esophagitis possibly related to NSAID use. Negative H. Pylori testing. GI recommendations (12/17) for Protonix BID x4 weeks with Protonix 40 mg daily for at least 6 months. Avoid NSAIDS -Continue Protonix 40 mg daily (decreased from BID dosing per GI recommendations)  Acute blood loss anemia Secondary to upper GI bleeding. Patient has required 5 units of PRBC per transfusion. Associated iron deficiency anemia. -Continue iron supplementation -Hgb remains stable  IVDU (intravenous drug user)- (present on admission) He's interested in [place] (recovery program) after discharge.  Encounter for central line care-resolved as of 01/09/2022 IJ placed on 12/15. Removed 1/3.  Electrolyte  abnormality-resolved as of 01/09/2022 Resolved.  Respiratory failure, post-operative (HCC)-resolved as of 01/09/2022 Required continued intubation post-procedure on 12/15 but eventually  extubated successfully the same day.  Streptococcal bacteremia-resolved as of 01/09/2022 Blood cultures (11/14/2021) significant for Streptococcus pyogenes. Source of bacteremia is from patient's upper extremity skin infection. ID was consulted. Patient managed on Vancomycin/Ceftriaxone empirically for one day, was transitioned to Penicillin G for 3 days and finally transitioned to Ceftriaxone/Daptomycin followed by Daptomycin monotherapy; Daptomycin discontinued on 1/20 per ID recommendations. Transthoracic Echocardiogram (11/16/2021) significant for no overt evidence of valvular vegetation and ID recommendation for no Transesophageal Echocardiogram. Repeat blood cultures (11/16/2021) with no growth.     Subjective: Complaining of continued pain involving R forearm  Physical Exam: Vitals:   01/12/22 0338 01/12/22 2150 01/13/22 0513 01/13/22 1248  BP: (!) 156/73 139/87 131/69 (!) 144/79  Pulse: 97 100 94 100  Resp: 14 20 20 18   Temp: 98.1 F (36.7 C) 98.2 F (36.8 C) 98.4 F (36.9 C) 98.5 F (36.9 C)  TempSrc: Oral Oral Oral Oral  SpO2: 96% 98% 98% 98%  Weight:      Height:       General exam: Awake, laying in bed, in nad Respiratory system: Normal respiratory effort, no wheezing Cardiovascular system: regular rate, s1, s2 Gastrointestinal system: Soft, nondistended, positive BS Central nervous system: CN2-12 grossly intact, strength intact Extremities: Perfused, no clubbing R forearm with dressings in place Skin: Normal skin turgor, no notable skin lesions seen Psychiatry: Mood normal // no visual hallucinations   Data Reviewed:  Labs reviewed  Family Communication: Pt in room, family not at bedside  Disposition: Status is: Inpatient  Remains inpatient appropriate because: Severity of  illness and dispo planning    Planned Discharge Destination: Barriers to discharge: difficult dispo planning      Author: Marylu Lund, MD 01/13/2022 1:51 PM  For on call review [email or web address].

## 2022-01-13 NOTE — TOC Progression Note (Signed)
Transition of Care [facility]) - Progression Note    Patient Details  Name: Jason Buchanan MRN: [id number] Date of Birth: 05-21-1986  Transition of Care [facility]) CM/SW Contact  Ida Rogue, [place] Phone Number: 01/13/2022, 11:24 AM  Clinical Narrative:   Checked in with patient about plan.  He said that his sister is awaiting a call back from [employer] about possible help.  I updated him that he is on waiting list for [employer] and [employer].  He had no further ideas/plans re: dispositional planning.  I suggested he contact sister and ask her to reach out SA again. TOC will continue to follow during the course of hospitalization.     Expected Discharge Plan: Homeless Shelter Barriers to Discharge: No Barriers Identified  Expected Discharge Plan and Services Expected Discharge Plan: Homeless Shelter   Discharge Planning Services: CM Consult   Living arrangements for the past 2 months: Homeless Shelter Expected Discharge Date: 11/14/21                 DME Agency: NA                   Social Determinants of Health (SDOH) Interventions    Readmission Risk Interventions No flowsheet data found.

## 2022-01-14 NOTE — Progress Notes (Signed)
Progress Note   Patient: Jason Buchanan [id number] DOB: 25-Apr-1986 DOA: 11/14/2021     61 DOS: the patient was seen and examined on 01/14/2022   Brief hospital course: Past medical history of polysubstance abuse presents with bilateral arm pain.  He has history of injecting multiple drugs in both arms.  CT scan showed upper extremity extensive cellulitis, placed on prednisone antibiotics.  He was admitted with working diagnosis of severe sepsis with right upper extremity cellulitis complicated with streptococcal bacteremia.  He is followed by multiple specialist ID plastic surgery.  Patient has required multiple I&D's in addition to placement of a wound VAC and application of skin graft x2.  Admission has been complicated by GI bleed secondary to esophagitis/gastritis, acute GI bleeding requiring 5 units of PRBC transfusion, significant narcotic use for management of pain.  Assessment and Plan: * Sepsis due to cellulitis (HCC)-resolved as of 01/09/2022, (present on admission) Secondary to bilateral upper extremity infections and bacteremia. -Please see problems: Streptococcal bacteremia Bacterial skin infection of upper extremity  Bacterial skin infection of upper extremity Initial CT imaging (11/14/22) consistent with right arm cellulitis. Patient underwent 4 I&D surgical procedures between orthopedic surgery and plastic surgery. Repeat CT imaging (12/04/21) concerning for abscess/pyomyositis of the right arm wound. Subsequently, patient has undergone 5 more surgical I&D procedures with application of a wound vac, in addition to graft placement (1/19). Patient has been managed on high-dose narcotic regimen of oxycodone 15 mg PO q3 hours PRN  -Pt had been continued Dilaudid 2 mg IV q3 hours PRN that has now been weaned off as of 2/10 - Plastic surgery reevaluated patient's wounds on 2/5 secondary to concern for infection, however no evidence of active infection identified (likely pseudomonas  colonization); no antibiotics restarted. -Continue Oxycodone PRN -Continue robaxin PRN -Pt continued to complain of R forearm pain. Ordered and reviewed R forearm xray. Findings concerning for metallic foreign body in soft tissue under skin graft site.  -Discussed with Plastic Surgery. Appreciate input. Given small size of object, surgery is unlikelly able to explore forearm without damaging surrounding tissue, thus no indication for surgery  Protein-calorie malnutrition, severe Dietitian recommendations (1/25): Ensure Enlive po TID, each supplement provides 350 kcal and 20 grams of protein  Multivitamin with minerals daily 500 mg Vitamin daily Prosource Plus PO TID, each provides 100 kcals and 15g protein  Hepatitis C 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 Follow outpatient   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[place]) Seen by psychiatry this admission. -Continue Seroquel and Trazodone  Upper GI bleed Patient evaluated by GI. Upper endoscopy performed on 12/15 and was significant for gastric/duodenal ulcers in addition to esophagitis possibly related to NSAID use. Negative H. Pylori testing. GI recommendations (12/17) for Protonix BID x4 weeks with Protonix 40 mg daily for at least 6 months. Avoid NSAIDS -Continue Protonix 40 mg daily (decreased from BID dosing per GI recommendations)  Acute blood loss anemia Secondary to upper GI bleeding. Patient has required 5 units of PRBC per transfusion this visit. Associated iron deficiency anemia. -Continue iron supplementation -Hgb had remained stable  IVDU (intravenous drug user)- (present on admission) He's interested in [place] (recovery program) after discharge.  Encounter for central line care-resolved as of 01/09/2022 IJ placed on 12/15. Removed 1/3.  Electrolyte abnormality-resolved as of 01/09/2022 Resolved.  Respiratory failure, post-operative (HCC)-resolved as of 01/09/2022 Required continued intubation  post-procedure on 12/15 but eventually extubated successfully the same day.  Streptococcal bacteremia-resolved as of 01/09/2022 Blood cultures (11/14/2021) significant for Streptococcus pyogenes. Source  of bacteremia is from patient's upper extremity skin infection. ID was consulted. Patient managed on Vancomycin/Ceftriaxone empirically for one day, was transitioned to Penicillin G for 3 days and finally transitioned to Ceftriaxone/Daptomycin followed by Daptomycin monotherapy; Daptomycin discontinued on 1/20 per ID recommendations. Transthoracic Echocardiogram (11/16/2021) significant for no overt evidence of valvular vegetation and ID recommendation for no Transesophageal Echocardiogram. Repeat blood cultures (11/16/2021) with no growth.     Subjective: Complaining of continued pain involving R forearm  Physical Exam: Vitals:   01/13/22 0513 01/13/22 1248 01/13/22 2003 01/14/22 0313  BP: 131/69 (!) 144/79 129/85 135/80  Pulse: 94 100 (!) 103 (!) 110  Resp: 20 18 16 20   Temp: 98.4 F (36.9 C) 98.5 F (36.9 C) 98.5 F (36.9 C) 97.7 F (36.5 C)  TempSrc: Oral Oral Oral Oral  SpO2: 98% 98% 99% 95%  Weight:      Height:       General exam: Awake, laying in bed, in nad Respiratory system: Normal respiratory effort, no wheezing Cardiovascular system: regular rate, s1, s2 Gastrointestinal system: Soft, nondistended, positive BS Central nervous system: CN2-12 grossly intact, strength intact Extremities: Perfused, no clubbing R forearm with dressings in place Skin: Normal skin turgor, no notable skin lesions seen Psychiatry: Mood normal // no visual hallucinations   Data Reviewed:  Labs reviewed  Family Communication: Pt in room, family not at bedside  Disposition: Status is: Inpatient  Remains inpatient appropriate because: Severity of illness and dispo planning    Planned Discharge Destination: Barriers to discharge: difficult dispo planning      Author: Marylu Lund,  MD 01/14/2022 3:49 PM  For on call review [email or web address].

## 2022-01-15 LAB — CBC
HCT: 39.8 % (ref 39.0–52.0)
Hemoglobin: 12 g/dL — ABNORMAL LOW (ref 13.0–17.0)
MCH: 26.9 pg (ref 26.0–34.0)
MCHC: 30.2 g/dL (ref 30.0–36.0)
MCV: 89.2 fL (ref 80.0–100.0)
Platelets: 340 10*3/uL (ref 150–400)
RBC: 4.46 MIL/uL (ref 4.22–5.81)
RDW: 15.5 % (ref 11.5–15.5)
WBC: 12.4 10*3/uL — ABNORMAL HIGH (ref 4.0–10.5)
nRBC: 0 % (ref 0.0–0.2)

## 2022-01-15 LAB — COMPREHENSIVE METABOLIC PANEL
ALT: 20 U/L (ref 0–44)
AST: 20 U/L (ref 15–41)
Albumin: 3.4 g/dL — ABNORMAL LOW (ref 3.5–5.0)
Alkaline Phosphatase: 92 U/L (ref 38–126)
Anion gap: 7 (ref 5–15)
BUN: 18 mg/dL (ref 6–20)
CO2: 25 mmol/L (ref 22–32)
Calcium: 9.1 mg/dL (ref 8.9–10.3)
Chloride: 103 mmol/L (ref 98–111)
Creatinine, Ser: 0.35 mg/dL — ABNORMAL LOW (ref 0.61–1.24)
GFR, Estimated: >60 mL/min (ref >60)
Glucose, Bld: 99 mg/dL (ref 70–99)
Potassium: 4.3 mmol/L (ref 3.5–5.1)
Sodium: 135 mmol/L (ref 135–145)
Total Bilirubin: 0.2 mg/dL — ABNORMAL LOW (ref 0.3–1.2)
Total Protein: 6.6 g/dL (ref 6.5–8.1)

## 2022-01-15 MED ORDER — AMITRIPTYLINE HCL 25 MG PO TABS
25.0000 mg | ORAL_TABLET | Freq: Every day | ORAL | Status: DC
  Administered 2022-01-15 – 2022-01-17 (×3): 25 mg via ORAL
  Filled 2022-01-15 (×3): qty 1

## 2022-01-15 NOTE — Progress Notes (Signed)
Progress Note   Patient: Jason Buchanan [id number] DOB: November 04, 1986 DOA: 11/14/2021     62 DOS: the patient was seen and examined on 01/15/2022   Brief hospital course: Past medical history of polysubstance abuse presents with bilateral arm pain.  He has history of injecting multiple drugs in both arms.  CT scan showed upper extremity extensive cellulitis, placed on prednisone antibiotics.  He was admitted with working diagnosis of severe sepsis with right upper extremity cellulitis complicated with streptococcal bacteremia.  He is followed by multiple specialist ID plastic surgery.  Patient has required multiple I&D's in addition to placement of a wound VAC and application of skin graft x2.  Admission has been complicated by GI bleed secondary to esophagitis/gastritis, acute GI bleeding requiring 5 units of PRBC transfusion, significant narcotic use for management of pain.  Assessment and Plan: * Sepsis due to cellulitis (HCC)-resolved as of 01/09/2022, (present on admission) Secondary to bilateral upper extremity infections and bacteremia. -Please see problems: Streptococcal bacteremia Bacterial skin infection of upper extremity  Bacterial skin infection of upper extremity Initial CT imaging (11/14/22) consistent with right arm cellulitis. Patient underwent 4 I&D surgical procedures between orthopedic surgery and plastic surgery. Repeat CT imaging (12/04/21) concerning for abscess/pyomyositis of the right arm wound. Subsequently, patient has undergone 5 more surgical I&D procedures with application of a wound vac, in addition to graft placement (1/19). Patient has been managed on high-dose narcotic regimen of oxycodone 15 mg PO q3 hours PRN  -Pt had been continued Dilaudid 2 mg IV q3 hours PRN that has now been weaned off as of 2/10 - Plastic surgery reevaluated patient's wounds on 2/5 secondary to concern for infection, however no evidence of active infection identified (likely pseudomonas  colonization); no antibiotics restarted. -Continue Oxycodone PRN -Continue robaxin PRN -Pt continued to complain of R forearm pain. Ordered and reviewed R forearm xray. Findings concerning for metallic foreign body in soft tissue under skin graft site.  -Discussed with Plastic Surgery. Appreciate input. Given small size of object, surgery is unlikelly able to explore forearm without damaging surrounding tissue, thus no indication for surgery -Still complaining of suboptimally controlled pain. Will add amitriptyline  Protein-calorie malnutrition, severe Dietitian recommendations (1/25): Ensure Enlive po TID, each supplement provides 350 kcal and 20 grams of protein  Multivitamin with minerals daily 500 mg Vitamin daily Prosource Plus PO TID, each provides 100 kcals and 15g protein  Hepatitis C 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 Follow outpatient   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[employer]) Seen by psychiatry this admission. -Continue Seroquel and Trazodone  Upper GI bleed Patient evaluated by GI. Upper endoscopy performed on 12/15 and was significant for gastric/duodenal ulcers in addition to esophagitis possibly related to NSAID use. Negative H. Pylori testing. GI recommendations (12/17) for Protonix BID x4 weeks with Protonix 40 mg daily for at least 6 months. Avoid NSAIDS -Continue Protonix 40 mg daily (decreased from BID dosing per GI recommendations)  Acute blood loss anemia Secondary to upper GI bleeding. Patient has required 5 units of PRBC per transfusion this visit. Associated iron deficiency anemia. -Continue iron supplementation -Hgb had remained stable  IVDU (intravenous drug user)- (present on admission) He's interested in [place] (recovery program) after discharge.  Encounter for central line care-resolved as of 01/09/2022 IJ placed on 12/15. Removed 1/3.  Electrolyte abnormality-resolved as of 01/09/2022 Resolved.  Respiratory failure,  post-operative (HCC)-resolved as of 01/09/2022 Required continued intubation post-procedure on 12/15 but eventually extubated successfully the same day.  Streptococcal bacteremia-resolved as of  01/09/2022 Blood cultures (11/14/2021) significant for Streptococcus pyogenes. Source of bacteremia is from patient's upper extremity skin infection. ID was consulted. Patient managed on Vancomycin/Ceftriaxone empirically for one day, was transitioned to Penicillin G for 3 days and finally transitioned to Ceftriaxone/Daptomycin followed by Daptomycin monotherapy; Daptomycin discontinued on 1/20 per ID recommendations. Transthoracic Echocardiogram (11/16/2021) significant for no overt evidence of valvular vegetation and ID recommendation for no Transesophageal Echocardiogram. Repeat blood cultures (11/16/2021) with no growth.     Subjective: Complaining of continued pains  Physical Exam: Vitals:   01/14/22 0313 01/14/22 2110 01/15/22 0520 01/15/22 1446  BP: 135/80 (!) 195/63 123/75 (!) 140/56  Pulse: (!) 110 99 99 (!) 102  Resp: 20 20 16 16   Temp: 97.7 F (36.5 C) 98.6 F (37 C) 98.2 F (36.8 C) 98 F (36.7 C)  TempSrc: Oral Oral Oral   SpO2: 95% 99% 96% 99%  Weight:      Height:       General exam: Conversant, in no acute distress Respiratory system: normal chest rise, clear, no audible wheezing Cardiovascular system: regular rhythm, s1-s2 Gastrointestinal system: Nondistended, nontender, pos BS Central nervous system: No seizures, no tremors Extremities: No cyanosis, no joint deformities Skin: No rashes, no pallor Psychiatry: Affect normal // no auditory hallucinations   Data Reviewed:  Labs reviewed  Family Communication: Pt in room, family not at bedside  Disposition: Status is: Inpatient  Remains inpatient appropriate because: Severity of illness and dispo planning    Planned Discharge Destination: Barriers to discharge: difficult dispo planning      Author: Marylu Lund,  MD 01/15/2022 3:48 PM  For on call review [email or web address].

## 2022-01-15 NOTE — Plan of Care (Signed)
Pt aox4, cooperative with staff.  Pain medication per orders.  Dressings CDI.  Plastics Following.   Problem: Education: Goal: Knowledge of General Education information will improve Description: Including pain rating scale, medication(s)/side effects and non-pharmacologic comfort measures Outcome: Progressing   Problem: Health Behavior/Discharge Planning: Goal: Ability to manage health-related needs will improve Outcome: Progressing   Problem: Clinical Measurements: Goal: Ability to maintain clinical measurements within normal limits will improve Outcome: Progressing Goal: Will remain free from infection Outcome: Progressing Goal: Diagnostic test results will improve Outcome: Progressing Goal: Respiratory complications will improve Outcome: Progressing Goal: Cardiovascular complication will be avoided Outcome: Progressing   Problem: Activity: Goal: Risk for activity intolerance will decrease Outcome: Progressing   Problem: Coping: Goal: Level of anxiety will decrease Outcome: Progressing   Problem: Elimination: Goal: Will not experience complications related to urinary retention Outcome: Progressing   Problem: Safety: Goal: Ability to remain free from injury will improve Outcome: Progressing   Problem: Skin Integrity: Goal: Risk for impaired skin integrity will decrease Outcome: Progressing   Problem: Fluid Volume: Goal: Hemodynamic stability will improve Outcome: Progressing   Problem: Clinical Measurements: Goal: Diagnostic test results will improve Outcome: Progressing Goal: Signs and symptoms of infection will decrease Outcome: Progressing   Problem: Respiratory: Goal: Ability to maintain adequate ventilation will improve Outcome: Progressing   Problem: Education: Goal: Knowledge of General Education information will improve Description: Including pain rating scale, medication(s)/side effects and non-pharmacologic comfort measures Outcome:  Progressing   Problem: Health Behavior/Discharge Planning: Goal: Ability to manage health-related needs will improve Outcome: Progressing   Problem: Clinical Measurements: Goal: Ability to maintain clinical measurements within normal limits will improve Outcome: Progressing Goal: Will remain free from infection Outcome: Progressing Goal: Diagnostic test results will improve Outcome: Progressing Goal: Respiratory complications will improve Outcome: Progressing Goal: Cardiovascular complication will be avoided Outcome: Progressing   Problem: Activity: Goal: Risk for activity intolerance will decrease Outcome: Progressing   Problem: Nutrition: Goal: Adequate nutrition will be maintained Outcome: Progressing   Problem: Coping: Goal: Level of anxiety will decrease Outcome: Progressing   Problem: Elimination: Goal: Will not experience complications related to bowel motility Outcome: Progressing Goal: Will not experience complications related to urinary retention Outcome: Progressing   Problem: Pain Managment: Goal: General experience of comfort will improve Outcome: Progressing   Problem: Safety: Goal: Ability to remain free from injury will improve Outcome: Progressing   Problem: Skin Integrity: Goal: Risk for impaired skin integrity will decrease Outcome: Progressing

## 2022-01-16 NOTE — TOC Progression Note (Signed)
Transition of Care [facility]) - Progression Note    Patient Details  Name: QUSAY VILLADA MRN: [id number] Date of Birth: 12/29/1985  Transition of Care [facility]) CM/SW Contact  Darleene Cleaver, [place] Phone Number: 01/16/2022, 7:57 PM  Clinical Narrative:     No changes, continuing to follow patient's progress, no safe discharge plan in place yet.  Expected Discharge Plan: Homeless Shelter Barriers to Discharge: No Barriers Identified  Expected Discharge Plan and Services Expected Discharge Plan: Homeless Shelter   Discharge Planning Services: CM Consult   Living arrangements for the past 2 months: Homeless Shelter Expected Discharge Date: 11/14/21                 DME Agency: NA                   Social Determinants of Health (SDOH) Interventions    Readmission Risk Interventions No flowsheet data found.

## 2022-01-16 NOTE — Progress Notes (Signed)
Pt removed dressing to Right Arm. Attending on unit, notified; requesting RN replace dressing.  RN cleansed area carefully with soap and water, allowed to dry.  New dressing applied. Educated patient on infection risk and other complications if he continued to remove dressings.

## 2022-01-16 NOTE — Progress Notes (Signed)
Progress Note   Patient: Jason Buchanan [id number] DOB: 10-14-1986 DOA: 11/14/2021     63 DOS: the patient was seen and examined on 01/16/2022   Brief hospital course: Past medical history of polysubstance abuse presents with bilateral arm pain.  He has history of injecting multiple drugs in both arms.  CT scan showed upper extremity extensive cellulitis, placed on prednisone antibiotics.  He was admitted with working diagnosis of severe sepsis with right upper extremity cellulitis complicated with streptococcal bacteremia.  He is followed by multiple specialist ID plastic surgery.  Patient has required multiple I&D's in addition to placement of a wound VAC and application of skin graft x2.  Admission has been complicated by GI bleed secondary to esophagitis/gastritis, acute GI bleeding requiring 5 units of PRBC transfusion, significant narcotic use for management of pain.  Assessment and Plan: * Sepsis due to cellulitis (HCC)-resolved as of 01/09/2022, (present on admission) Secondary to bilateral upper extremity infections and bacteremia. -Please see problems: Streptococcal bacteremia Bacterial skin infection of upper extremity  Bacterial skin infection of upper extremity Initial CT imaging (11/14/22) consistent with right arm cellulitis. Patient underwent 4 I&D surgical procedures between orthopedic surgery and plastic surgery. Repeat CT imaging (12/04/21) concerning for abscess/pyomyositis of the right arm wound. Subsequently, patient has undergone 5 more surgical I&D procedures with application of a wound vac, in addition to graft placement (1/19). Patient has been managed on high-dose narcotic regimen of oxycodone 15 mg PO q3 hours PRN  -Pt had been continued Dilaudid 2 mg IV q3 hours PRN that has now been weaned off as of 2/10 - Plastic surgery reevaluated patient's wounds on 2/5 secondary to concern for infection, however no evidence of active infection identified (likely pseudomonas  colonization); no antibiotics restarted. -Continue Oxycodone PRN -Continue robaxin PRN -Pt continued to complain of R forearm pain. Ordered and reviewed R forearm xray. Findings concerning for metallic foreign body in soft tissue under skin graft site.  -Recently discussed with Plastic Surgery. Appreciate input. Given small size of object, surgery is unlikelly able to explore forearm without damaging surrounding tissue, thus no indication for surgery -Recently added amitriptyline QHS   Protein-calorie malnutrition, severe Dietitian recommendations (1/25): Ensure Enlive po TID, each supplement provides 350 kcal and 20 grams of protein  Multivitamin with minerals daily 500 mg Vitamin daily Prosource Plus PO TID, each provides 100 kcals and 15g protein  Hepatitis C 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 Follow outpatient   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[place]) Seen by psychiatry this admission. -Continue Seroquel and Trazodone  Upper GI bleed Patient evaluated by GI. Upper endoscopy performed on 12/15 and was significant for gastric/duodenal ulcers in addition to esophagitis possibly related to NSAID use. Negative H. Pylori testing. GI recommendations (12/17) for Protonix BID x4 weeks with Protonix 40 mg daily for at least 6 months. Avoid NSAIDS -Continue Protonix 40 mg daily (decreased from BID dosing per GI recommendations)  Acute blood loss anemia Secondary to upper GI bleeding. Patient has required 5 units of PRBC per transfusion this visit. Associated iron deficiency anemia. -Continue iron supplementation -Hgb had remained stable  IVDU (intravenous drug user)- (present on admission) He's interested in [place] (recovery program) after discharge.  Encounter for central line care-resolved as of 01/09/2022 IJ placed on 12/15. Removed 1/3.  Electrolyte abnormality-resolved as of 01/09/2022 Resolved.  Respiratory failure, post-operative (HCC)-resolved as of  01/09/2022 Required continued intubation post-procedure on 12/15 but eventually extubated successfully the same day.  Streptococcal bacteremia-resolved as of 01/09/2022 Blood cultures (  11/14/2021) significant for Streptococcus pyogenes. Source of bacteremia is from patient's upper extremity skin infection. ID was consulted. Patient managed on Vancomycin/Ceftriaxone empirically for one day, was transitioned to Penicillin G for 3 days and finally transitioned to Ceftriaxone/Daptomycin followed by Daptomycin monotherapy; Daptomycin discontinued on 1/20 per ID recommendations. Transthoracic Echocardiogram (11/16/2021) significant for no overt evidence of valvular vegetation and ID recommendation for no Transesophageal Echocardiogram. Repeat blood cultures (11/16/2021) with no growth.     Subjective: Still complaining of wound pains  Physical Exam: Vitals:   01/15/22 0520 01/15/22 1446 01/15/22 2105 01/16/22 0349  BP: 123/75 (!) 140/56 (!) 152/80 129/68  Pulse: 99 (!) 102 (!) 101 100  Resp: 16 16 20 20   Temp: 98.2 F (36.8 C) 98 F (36.7 C) 98 F (36.7 C) 97.7 F (36.5 C)  TempSrc: Oral  Oral Oral  SpO2: 96% 99% 98% 97%  Weight:      Height:       General exam: Awake, laying in bed, in nad Respiratory system: Normal respiratory effort, no wheezing Cardiovascular system: regular rate, s1, s2 Gastrointestinal system: Soft, nondistended, positive BS Central nervous system: CN2-12 grossly intact, strength intact Extremities: Perfused, no clubbing, dressings over R wrist and LE Skin: Normal skin turgor, no notable skin lesions seen Psychiatry: Mood normal // no visual hallucinations   Data Reviewed:  Labs reviewed  Family Communication: Pt in room, family not at bedside  Disposition: Status is: Inpatient  Remains inpatient appropriate because: Severity of illness and dispo planning    Planned Discharge Destination: Barriers to discharge: difficult dispo  planning      Author: Marylu Lund, MD 01/16/2022 2:52 PM  For on call review [email or web address].

## 2022-01-16 NOTE — Plan of Care (Signed)
Pt Jason Buchanan, cooperative with staff.  °Pain medication per orders.  °Dressings CDI.  °Plastics Following.  ° °Problem: Education: °Goal: Knowledge of General Education information will improve °Description: Including pain rating scale, medication(s)/side effects and non-pharmacologic comfort measures °Outcome: Progressing °  °Problem: Health Behavior/Discharge Planning: °Goal: Ability to manage health-related needs will improve °Outcome: Progressing °  °Problem: Clinical Measurements: °Goal: Ability to maintain clinical measurements within normal limits will improve °Outcome: Progressing °Goal: Will remain free from infection °Outcome: Progressing °Goal: Diagnostic test results will improve °Outcome: Progressing °Goal: Respiratory complications will improve °Outcome: Progressing °Goal: Cardiovascular complication will be avoided °Outcome: Progressing °  °Problem: Activity: °Goal: Risk for activity intolerance will decrease °Outcome: Progressing °  °Problem: Coping: °Goal: Level of anxiety will decrease °Outcome: Progressing °  °Problem: Elimination: °Goal: Will not experience complications related to urinary retention °Outcome: Progressing °  °Problem: Safety: °Goal: Ability to remain free from injury will improve °Outcome: Progressing °  °Problem: Skin Integrity: °Goal: Risk for impaired skin integrity will decrease °Outcome: Progressing °  °Problem: Fluid Volume: °Goal: Hemodynamic stability will improve °Outcome: Progressing °  °Problem: Clinical Measurements: °Goal: Diagnostic test results will improve °Outcome: Progressing °Goal: Signs and symptoms of infection will decrease °Outcome: Progressing °  °Problem: Respiratory: °Goal: Ability to maintain adequate ventilation will improve °Outcome: Progressing °  °Problem: Education: °Goal: Knowledge of General Education information will improve °Description: Including pain rating scale, medication(s)/side effects and non-pharmacologic comfort measures °Outcome:  Progressing °  °Problem: Health Behavior/Discharge Planning: °Goal: Ability to manage health-related needs will improve °Outcome: Progressing °  °Problem: Clinical Measurements: °Goal: Ability to maintain clinical measurements within normal limits will improve °Outcome: Progressing °Goal: Will remain free from infection °Outcome: Progressing °Goal: Diagnostic test results will improve °Outcome: Progressing °Goal: Respiratory complications will improve °Outcome: Progressing °Goal: Cardiovascular complication will be avoided °Outcome: Progressing °  °Problem: Activity: °Goal: Risk for activity intolerance will decrease °Outcome: Progressing °  °Problem: Nutrition: °Goal: Adequate nutrition will be maintained °Outcome: Progressing °  °Problem: Coping: °Goal: Level of anxiety will decrease °Outcome: Progressing °  °Problem: Elimination: °Goal: Will not experience complications related to bowel motility °Outcome: Progressing °Goal: Will not experience complications related to urinary retention °Outcome: Progressing °  °Problem: Pain Managment: °Goal: General experience of comfort will improve °Outcome: Progressing °  °Problem: Safety: °Goal: Ability to remain free from injury will improve °Outcome: Progressing °  °Problem: Skin Integrity: °Goal: Risk for impaired skin integrity will decrease °Outcome: Progressing °  °

## 2022-01-17 MED ORDER — OXYCODONE HCL 5 MG PO TABS
15.0000 mg | ORAL_TABLET | ORAL | Status: DC | PRN
  Administered 2022-01-17 – 2022-01-18 (×7): 15 mg via ORAL
  Filled 2022-01-17 (×7): qty 3

## 2022-01-17 MED ORDER — OXYCODONE HCL 5 MG PO CAPS: 5.0000 mg | ORAL_CAPSULE | ORAL | 0 refills | Status: DC | PRN

## 2022-01-17 MED ORDER — METHOCARBAMOL 500 MG PO TABS: 500.0000 mg | ORAL_TABLET | Freq: Three times a day (TID) | ORAL | 0 refills | Status: DC | PRN

## 2022-01-17 MED ORDER — AMITRIPTYLINE HCL 25 MG PO TABS: 25.0000 mg | ORAL_TABLET | Freq: Every day | ORAL | 0 refills | Status: DC

## 2022-01-17 MED ORDER — PANTOPRAZOLE SODIUM 40 MG PO TBEC: 40.0000 mg | DELAYED_RELEASE_TABLET | Freq: Every day | ORAL | 0 refills | Status: DC

## 2022-01-17 MED ORDER — QUETIAPINE FUMARATE ER 300 MG PO TB24: 300.0000 mg | ORAL_TABLET | Freq: Every day | ORAL | 0 refills | Status: DC

## 2022-01-17 MED ORDER — GABAPENTIN 400 MG PO CAPS: 400.0000 mg | ORAL_CAPSULE | Freq: Three times a day (TID) | ORAL | 0 refills | Status: DC

## 2022-01-17 MED ORDER — METHOCARBAMOL 500 MG PO TABS
500.0000 mg | ORAL_TABLET | Freq: Three times a day (TID) | ORAL | Status: DC | PRN
  Administered 2022-01-17 – 2022-01-18 (×3): 500 mg via ORAL
  Filled 2022-01-17 (×4): qty 1

## 2022-01-17 MED ORDER — FERROUS SULFATE 325 (65 FE) MG PO TABS: 325.0000 mg | ORAL_TABLET | Freq: Every day | ORAL | 0 refills | Status: DC

## 2022-01-17 NOTE — Progress Notes (Signed)
Chaplain was able to provide Jason Buchanan with a shirt and pullover from our [employer]. Chaplain offered support.     01/17/22 1600  Clinical Encounter Type  Visited With Patient;Health care provider  Visit Type Initial;Social support  Referral From Nurse  Consult/Referral To Chaplain

## 2022-01-17 NOTE — Discharge Summary (Signed)
Physician Discharge Summary   Patient: Jason Buchanan MRN: [id number] DOB: 1986-09-02  Admit date:     11/14/2021  Discharge date: 01/17/22  Discharge Physician: Marylu Lund   PCP: [name], NP   Recommendations at discharge:    F/u with PCP in 1-2 weeks  [place] reviewed. 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  He has history of injecting multiple drugs in both arms.  CT scan showed upper extremity extensive cellulitis, placed on prednisone antibiotics.  He was admitted with working diagnosis of severe sepsis with right upper extremity cellulitis complicated with streptococcal bacteremia.  He is followed by multiple specialist ID plastic surgery.  Patient has required multiple I&D's in addition to placement of a wound VAC and application of skin graft x2.  Admission has been complicated by GI bleed secondary to esophagitis/gastritis, acute GI bleeding requiring 5 units of PRBC transfusion, significant narcotic use for management of pain.  Assessment and Plan: * Sepsis due to cellulitis (HCC)-resolved as of 01/09/2022, (present on admission) Secondary to bilateral upper extremity infections and  bacteremia. -Please see problems: Streptococcal bacteremia Bacterial skin infection of upper extremity  Bacterial skin infection of upper extremity Initial CT imaging (11/14/22) consistent with right arm cellulitis. Patient underwent 4 I&D surgical procedures between orthopedic surgery and plastic surgery. Repeat CT imaging (12/04/21) concerning for abscess/pyomyositis of the right arm wound. Subsequently, patient has undergone 5 more surgical I&D procedures with application of a wound vac, in addition to graft placement (1/19). Patient has been managed on high-dose narcotic regimen of oxycodone 15 mg PO q3 hours PRN  -Pt had been continued Dilaudid 2 mg IV q3 hours PRN that has now been weaned off as of 2/10 - Plastic surgery reevaluated patient's wounds on 2/5 secondary to concern for infection, however no evidence of active infection identified (likely pseudomonas colonization); no antibiotics restarted. -Continued Oxycodone PRN -Continued robaxin PRN -Pt continued to complain of R forearm pain. Ordered and reviewed R forearm xray. Findings concerning for metallic foreign body in soft tissue under skin graft site.  -Recently discussed with Plastic Surgery. Appreciate input. Given small size of object, surgery is unlikelly able to explore forearm without damaging surrounding tissue, thus no indication for surgery -Recently added amitriptyline QHS   Protein-calorie malnutrition, severe Dietitian recommendations (1/25): Ensure Enlive po TID, each supplement provides 350 kcal and 20 grams of protein  Multivitamin with minerals daily 500 mg Vitamin daily Prosource Plus PO TID, each provides 100 kcals and 15g protein  Hepatitis C 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 Follow outpatient   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[place]) Seen by psychiatry this admission. -Continue Seroquel and Trazodone  Upper GI bleed Patient evaluated by GI. Upper endoscopy performed on 12/15  and was significant for gastric/duodenal ulcers in addition to  esophagitis possibly related to NSAID use. Negative H. Pylori testing. GI recommendations (12/17) for Protonix BID x4 weeks with Protonix 40 mg daily for at least 6 months. Avoid NSAIDS -Continue Protonix 40 mg daily (decreased from BID dosing per GI recommendations)  Acute blood loss anemia Secondary to upper GI bleeding. Patient has required 5 units of PRBC per transfusion this visit. Associated iron deficiency anemia. -Continue iron supplementation -Hgb had remained stable  IVDU (intravenous drug user)- (present on admission) He's interested in [place] (recovery program) after discharge.  Encounter for central line care-resolved as of 01/09/2022 IJ placed on 12/15. Removed 1/3.  Electrolyte abnormality-resolved as of 01/09/2022 Resolved.  Respiratory failure, post-operative (HCC)-resolved as of 01/09/2022 Required continued intubation post-procedure on 12/15 but eventually extubated successfully the same day.  Streptococcal bacteremia-resolved as of 01/09/2022 Blood cultures (11/14/2021) significant for Streptococcus pyogenes. Source of bacteremia is from patient's upper extremity skin infection. ID was consulted. Patient managed on Vancomycin/Ceftriaxone empirically for one day, was transitioned to Penicillin G for 3 days and finally transitioned to Ceftriaxone/Daptomycin followed by Daptomycin monotherapy; Daptomycin discontinued on 1/20 per ID recommendations. Transthoracic Echocardiogram (11/16/2021) significant for no overt evidence of valvular vegetation and ID recommendation for no Transesophageal Echocardiogram. Repeat blood cultures (11/16/2021) with no growth.           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 Had prolonged block (>30 min) after succinylcholine for GI procedure.    Chlorhexidine         Medication List     STOP taking these medications    ibuprofen 200 MG tablet Commonly known as: ADVIL   Naprosyn 500 MG tablet Generic drug: naproxen   NuShield 4 CM X 4 CM Shee   Saline Flush 0.9 % Soln   ZyrTEC Allergy 10 MG tablet Generic drug: cetirizine       TAKE these medications    amitriptyline 25 MG tablet Commonly known as: ELAVIL Take 1 tablet (25 mg total) by mouth at bedtime.   ferrous sulfate 325 (65 FE) MG tablet Take 1 tablet (325 mg total) by mouth daily with breakfast. Start taking on: January 18, 2022   gabapentin 400 MG capsule Commonly known as: NEURONTIN Take 1 capsule (400 mg total) by mouth 3 (three) times daily.   methocarbamol 500 MG tablet Commonly known as: ROBAXIN Take 1 tablet (500 mg total) by mouth every 8 (eight) hours as needed for muscle spasms.   oxycodone 5 MG capsule Commonly known as: OXY-IR Take 1 capsule (5 mg total) by mouth every 4 (four) hours as needed.   pantoprazole 40 MG tablet Commonly known as: PROTONIX Take 1 tablet (40 mg total) by mouth daily. Start taking on: January 18, 2022   QUEtiapine 300 MG 24 hr tablet Commonly known as: SEROQUEL XR Take 1 tablet (300 mg total) by mouth at bedtime.        Follow-up Information     [name], [name], NP Follow up in 1 week(s).   Why: Hospital follow up Contact information: [place] [address] [phone]                 Discharge Exam: Eleanor Weights   11/15/21 1608 11/25/21 1342 12/29/21 1000  Weight: 60 kg 60 kg 78.2 kg   General exam: Awake, laying in bed, in nad Respiratory system: Normal respiratory effort, no wheezing Cardiovascular system: regular rate, s1, s2 Gastrointestinal system: Soft, nondistended, positive BS Central nervous  system: CN2-12 grossly intact, strength intact Extremities: Perfused, no clubbing Skin: Normal skin turgor, no notable skin lesions seen Psychiatry: Mood normal // no visual hallucinations    Condition at discharge: good  The results of significant diagnostics from this hospitalization (including imaging, microbiology, ancillary and laboratory) are listed below for reference.   Imaging Studies: DG Forearm Right  Addendum Date: 01/12/2022   ADDENDUM REPORT: 01/12/2022 15:01 ADDENDUM: Foreign body in the forearm more likely a small needle based on history gathered from the provider at the time of discussion projects over the soft tissues of the forearm as outlined in the initial report. These results were called by telephone at the time of interpretation on 01/12/2022 at 3:01 pm to provider [facility] , who verbally acknowledged these results. Electronically Signed   By: Zetta Bills M.D.   On: 01/12/2022 15:01   Result Date: 01/12/2022 CLINICAL DATA:  A 36 year old male presents following debridement of the forearm wound on 01/31 of 2023 EXAM: RIGHT FOREARM - 2 VIEW COMPARISON:  Only hand radiographs are available for comparison. FINDINGS: Skin staples overlie the anterior aspect of the forearm. Irregularity of the soft tissue is noted likely related to reported history of debridement in this location there is a a linear metallic density that projects approximately 2.5 cm anterior to the radius on the lateral projection and along the radius on the frontal projection. This measures approximately 13 mm. No signs of acute bony abnormality or soft tissue gas. Soft tissue swelling is demonstrated along the volar aspect of the forearm. IMPRESSION: Skin staples overlying the reported debridement site but with linear metallic density projecting over the anterior soft tissues of the mid forearm, suspicious for foreign body within the soft tissues. Soft tissue swelling without acute bony abnormality. A call is  out to the referring provider to further discuss findings in the above case. Electronically Signed: By: Zetta Bills M.D. On: 01/12/2022 14:48   [place] EKG SITE RITE  Result Date: 12/19/2021 If Site Rite image not attached, placement could not be confirmed due to current cardiac rhythm.   Microbiology: Results for orders placed or performed during the hospital encounter of 11/14/21  Resp Panel by RT-PCR (Flu A&B, Covid) Nasopharyngeal Swab     Status: None   Collection Time: 11/14/21  8:54 AM   Specimen: Nasopharyngeal Swab; Nasopharyngeal(NP) swabs in vial transport medium  Result Value Ref Range Status   SARS Coronavirus 2 by RT PCR NEGATIVE NEGATIVE Final    Comment: (NOTE) SARS-CoV-2 target nucleic acids are NOT DETECTED.  The SARS-CoV-2 RNA is generally detectable in upper respiratory specimens during the acute phase of infection. The lowest concentration of SARS-CoV-2 viral copies this assay can detect is 138 copies/mL. A negative result does not preclude SARS-Cov-2 infection and should not be used as the sole basis for treatment or other patient management decisions. A negative result may occur with  improper specimen collection/handling, submission of specimen other than nasopharyngeal swab, presence of viral mutation(s) within the areas targeted by this assay, and inadequate number of viral copies(<138 copies/mL). A negative result must be combined with clinical observations, patient history, and epidemiological information. The expected result is Negative.  Fact Sheet for Patients:  EntrepreneurPulse.com.au  Fact Sheet for Healthcare Providers:  IncredibleEmployment.be  This test is no t yet approved or cleared by the [place] and  has been authorized for detection and/or diagnosis of SARS-CoV-2 by FDA under an Emergency Use Authorization (EUA). This EUA will remain  in effect (meaning this test can be used) for the duration of  the COVID-19 declaration under Section 564(b)(1) of the Act, 21 U.S.C.section 360bbb-3(b)(1), unless the authorization is terminated  or revoked sooner.       Influenza A by PCR NEGATIVE NEGATIVE Final   Influenza B by PCR NEGATIVE NEGATIVE Final    Comment: (NOTE) The Xpert Xpress SARS-CoV-2/FLU/RSV plus assay is intended as an aid in the diagnosis of influenza from Nasopharyngeal swab specimens and should not be used as a sole basis for treatment. Nasal washings and aspirates are unacceptable for Xpert Xpress SARS-CoV-2/FLU/RSV testing.  Fact Sheet for Patients: EntrepreneurPulse.com.au  Fact Sheet for Healthcare Providers: IncredibleEmployment.be  This test is not yet approved or cleared by the [place] FDA and has been authorized for detection and/or diagnosis of SARS-CoV-2 by FDA under an Emergency Use Authorization (EUA). This EUA will remain in effect (meaning this test can be used) for the duration of the COVID-19 declaration under Section 564(b)(1) of the Act, 21 U.S.C. section 360bbb-3(b)(1), unless the authorization is terminated or revoked.  Performed at [facility], [place] [address], [place], [place] [address]   Blood Culture (routine x 2)     Status: Abnormal   Collection Time: 11/14/21  8:54 AM   Specimen: BLOOD  Result Value Ref Range Status   Specimen Description   Final    BLOOD LEFT ANTECUBITAL Performed at [facility] [address], [place], [place] [address]    Special Requests   Final    BOTTLES DRAWN AEROBIC AND ANAEROBIC Blood Culture results may not be optimal due to an excessive volume of blood received in culture bottles Performed at [facility] [address], [place], [place] [address]    Culture  Setup Time   Final    GRAM POSITIVE COCCI IN CHAINS ANAEROBIC BOTTLE ONLY CRITICAL RESULT CALLED TO, READ BACK BY AND VERIFIED WITH: M  LILLISTON,PHARMD@0300  11/15/21 [place]    Culture (A)  Final    STREPTOCOCCUS PYOGENES HEALTH DEPARTMENT NOTIFIED Performed at [facility], [place] [address], [place], [place] [address]    Report Status 11/17/2021 FINAL  Final   Organism ID, Bacteria STREPTOCOCCUS PYOGENES  Final      Susceptibility   Streptococcus pyogenes - MIC*    PENICILLIN <=0.06 SENSITIVE Sensitive     CEFTRIAXONE <=0.12 SENSITIVE Sensitive     ERYTHROMYCIN >=8 RESISTANT Resistant     LEVOFLOXACIN <=0.25 SENSITIVE Sensitive     VANCOMYCIN 0.5 SENSITIVE Sensitive     * STREPTOCOCCUS PYOGENES  Blood Culture ID Panel (Reflexed)     Status: Abnormal   Collection Time: 11/14/21  8:54 AM  Result Value Ref Range Status   Enterococcus faecalis NOT DETECTED NOT DETECTED Final   Enterococcus Faecium NOT DETECTED NOT DETECTED Final   Listeria monocytogenes NOT DETECTED NOT DETECTED Final   Staphylococcus species NOT DETECTED NOT DETECTED Final   Staphylococcus aureus (BCID) NOT DETECTED NOT DETECTED Final   Staphylococcus epidermidis NOT DETECTED NOT DETECTED Final   Staphylococcus lugdunensis NOT DETECTED NOT DETECTED Final   Streptococcus species DETECTED (A) NOT DETECTED Final    Comment: CRITICAL RESULT CALLED TO, READ BACK BY AND VERIFIED WITH: M LILLISTON,PHARMD@0300  11/15/21 [place]  Streptococcus agalactiae NOT DETECTED NOT DETECTED Final   Streptococcus pneumoniae NOT DETECTED NOT DETECTED Final   Streptococcus pyogenes DETECTED (A) NOT DETECTED Final    Comment: CRITICAL RESULT CALLED TO, READ BACK BY AND VERIFIED WITH: M LILLISTON,PHARMD@0300  11/15/21 [place]    A.calcoaceticus-baumannii NOT DETECTED NOT DETECTED Final   Bacteroides fragilis NOT DETECTED NOT DETECTED Final   Enterobacterales NOT DETECTED NOT DETECTED Final   Enterobacter cloacae complex NOT DETECTED NOT DETECTED Final   Escherichia coli NOT DETECTED NOT DETECTED Final   Klebsiella aerogenes NOT DETECTED NOT DETECTED Final   Klebsiella oxytoca  NOT DETECTED NOT DETECTED Final   Klebsiella pneumoniae NOT DETECTED NOT DETECTED Final   Proteus species NOT DETECTED NOT DETECTED Final   Salmonella species NOT DETECTED NOT DETECTED Final   Serratia marcescens NOT DETECTED NOT DETECTED Final   Haemophilus influenzae NOT DETECTED NOT DETECTED Final   Neisseria meningitidis NOT DETECTED NOT DETECTED Final   Pseudomonas aeruginosa NOT DETECTED NOT DETECTED Final   Stenotrophomonas maltophilia NOT DETECTED NOT DETECTED Final   Candida albicans NOT DETECTED NOT DETECTED Final   Candida auris NOT DETECTED NOT DETECTED Final   Candida glabrata NOT DETECTED NOT DETECTED Final   Candida krusei NOT DETECTED NOT DETECTED Final   Candida parapsilosis NOT DETECTED NOT DETECTED Final   Candida tropicalis NOT DETECTED NOT DETECTED Final   Cryptococcus neoformans/gattii NOT DETECTED NOT DETECTED Final    Comment: Performed at [facility], [address], [place], [place] [address]  Blood Culture (routine x 2)     Status: None   Collection Time: 11/14/21  9:54 AM   Specimen: BLOOD  Result Value Ref Range Status   Specimen Description   Final    BLOOD LEFT ARM Performed at [facility] [address] [place], [place] [address]    Special Requests   Final    BOTTLES DRAWN AEROBIC AND ANAEROBIC Blood Culture adequate volume Performed at [facility] [address] [place], [place] [address]    Culture   Final    NO GROWTH 5 DAYS Performed at [facility], [place] [address], [place], [place] [address]    Report Status 11/19/2021 FINAL  Final  Aerobic/Anaerobic Culture w Gram Stain (surgical/deep wound)     Status: None   Collection Time: 11/15/21  5:46 PM   Specimen: PATH Other; Tissue  Result Value Ref Range Status   Specimen Description   Final    WRIST RIGHT Performed at [facility] [address], [place], [place] [address]    Special Requests   Final     NONE Performed at [facility], [place] [address] [place], [place] [address]    Gram Stain   Final    FEW WBC PRESENT, PREDOMINANTLY MONONUCLEAR FEW GRAM POSITIVE COCCI IN PAIRS AND CHAINS    Culture   Final    MODERATE STREPTOCOCCUS PYOGENES Beta hemolytic streptococci are predictably susceptible to penicillin and other beta lactams. Susceptibility testing not routinely performed. RARE STAPHYLOCOCCUS AUREUS SUSCEPTIBILITIES PERFORMED ON PREVIOUS CULTURE WITHIN THE LAST 5 DAYS. NO ANAEROBES ISOLATED Performed at [facility], [place] [address] [place], [place] [address]    Report Status 11/21/2021 FINAL  Final  Aerobic/Anaerobic Culture w Gram Stain (surgical/deep wound)     Status: None   Collection Time: 11/15/21  6:35 PM   Specimen: PATH Other; Tissue  Result Value Ref Range Status   Specimen Description  Final    HAND LEFT DORSAL Performed at [facility] [address], [place], [place] [address]    Special Requests   Final    NONE Performed at [facility], [place] [address], [place], [place] [address]    Gram Stain   Final    MODERATE WBC PRESENT, PREDOMINANTLY MONONUCLEAR ABUNDANT GRAM POSITIVE COCCI    Culture   Final    ABUNDANT STREPTOCOCCUS PYOGENES Beta hemolytic streptococci are predictably susceptible to penicillin and other beta lactams. Susceptibility testing not routinely performed. RARE METHICILLIN RESISTANT STAPHYLOCOCCUS AUREUS RARE STREPTOCOCCUS PNEUMONIAE NO ANAEROBES ISOLATED Performed at [facility], [place] [address] [place], [place] [address]    Report Status 11/21/2021 FINAL  Final   Organism ID, Bacteria METHICILLIN RESISTANT STAPHYLOCOCCUS AUREUS  Final   Organism ID, Bacteria STREPTOCOCCUS PNEUMONIAE  Final      Susceptibility   Methicillin resistant staphylococcus aureus - MIC*    CIPROFLOXACIN >=8 RESISTANT Resistant     ERYTHROMYCIN >=8 RESISTANT Resistant     GENTAMICIN  <=0.5 SENSITIVE Sensitive     OXACILLIN >=4 RESISTANT Resistant     TETRACYCLINE <=1 SENSITIVE Sensitive     VANCOMYCIN 1 SENSITIVE Sensitive     TRIMETH/SULFA >=320 RESISTANT Resistant     CLINDAMYCIN >=8 RESISTANT Resistant     RIFAMPIN <=0.5 SENSITIVE Sensitive     Inducible Clindamycin NEGATIVE Sensitive     * RARE METHICILLIN RESISTANT STAPHYLOCOCCUS AUREUS   Streptococcus pneumoniae - MIC*    ERYTHROMYCIN >=8 RESISTANT Resistant     LEVOFLOXACIN 0.5 SENSITIVE Sensitive     VANCOMYCIN <=0.12 SENSITIVE Sensitive     PENICILLIN (meningitis) 1 RESISTANT Resistant     PENO - penicillin 1      PENICILLIN (non-meningitis) 1 SENSITIVE Sensitive     PENICILLIN (oral) 1 INTERMEDIATE Intermediate     CEFTRIAXONE (non-meningitis) 1 SENSITIVE Sensitive     CEFTRIAXONE (meningitis) 1 INTERMEDIATE Intermediate     * RARE STREPTOCOCCUS PNEUMONIAE  Culture, blood (Routine X 2) w Reflex to ID Panel     Status: None   Collection Time: 11/16/21  4:51 AM   Specimen: BLOOD  Result Value Ref Range Status   Specimen Description   Final    BLOOD RIGHT FOOT Performed at [facility] [address], [place], [place] [address]    Special Requests   Final    BOTTLES DRAWN AEROBIC ONLY Blood Culture adequate volume Performed at [facility] [address] [place], [place] [address]    Culture   Final    NO GROWTH 5 DAYS Performed at [facility], [place] [address] [place], [place] [address]    Report Status 11/21/2021 FINAL  Final  Culture, blood (Routine X 2) w Reflex to ID Panel     Status: None   Collection Time: 11/16/21  4:51 AM   Specimen: BLOOD  Result Value Ref Range Status   Specimen Description   Final    BLOOD RIGHT ANKLE Performed at [facility] [address], [place], [place] [address]    Special Requests   Final    BOTTLES DRAWN AEROBIC ONLY Blood Culture adequate volume Performed at [facility] [address], [place], [place] [address]    Culture   Final    NO GROWTH 5 DAYS Performed at [facility], [place] [address] [place], [place] [address]    Report Status 11/21/2021 FINAL  Final  MRSA Next Gen by PCR, Nasal     Status: Abnormal   Collection Time: 11/17/21 12:54 PM   Specimen: Nasal Mucosa; Nasal Swab  Result Value Ref Range Status   MRSA by PCR Next Gen DETECTED (A) NOT DETECTED Final    Comment: RESULT CALLED TO, READ BACK BY AND VERIFIED WITH: SAWYER,A. RN AT [id number] 11/17/21 MULLINS,T (NOTE) The GeneXpert MRSA Assay (FDA approved for NASAL specimens only), is one component of a comprehensive MRSA colonization surveillance program. It is not intended to diagnose MRSA infection nor to guide or monitor treatment for MRSA infections. Test performance is not FDA approved in patients less than 40 years old. Performed at [facility], [place] [address], [place], [place] [address]     Labs: CBC: Recent Labs  Lab 01/11/22 0504 01/15/22 0513  WBC 9.3 12.4*  HGB 12.6* 12.0*  HCT 39.9 39.8  MCV 86.9 89.2  PLT 411* [id number]   Basic Metabolic Panel: Recent Labs  Lab 01/11/22 0504 01/15/22 0513  NA 134* 135  K 4.0 4.3  CL 102 103  CO2 22 25  GLUCOSE 107* 99  BUN 21* 18  CREATININE 0.47* 0.35*  CALCIUM 9.2 9.1   Liver Function Tests: Recent Labs  Lab 01/15/22 0513  AST 20  ALT 20  ALKPHOS 92  BILITOT 0.2*  PROT 6.6  ALBUMIN 3.4*   CBG: No results for input(s): GLUCAP in the last 168 hours.  Discharge time spent: less than 30 minutes.  Signed: Marylu Lund, MD [facility] 01/17/2022

## 2022-01-17 NOTE — TOC Transition Note (Addendum)
Transition of Care [facility]) - CM/SW Discharge Note   Patient Details  Name: Jason Buchanan MRN: [id number] Date of Birth: [birth year]  Transition of Care [facility]) CM/SW Contact:  Darleene Cleaver, LCSW Phone Number: 01/17/2022, 11:07 PM   Clinical Narrative:    Patient was supposed to discharge today, however since discharge order was put in late, [facility] shelter was closing within 5 minutes of discharge order being written.  Patient to discharge after 8am once [facility] reopens in the morning.  Taxi voucher and bus pass were put in patient's shadow chart so transportation can be arranged in the morning.  CSW updated TOC lead Sharol Roussel.  Patient also provided with several days of bandages so he can change his dressings.  This CSW signing off.   Final next level of care: Homeless Shelter Barriers to Discharge: Other (must enter comment) (Homeless shelter closed at 5pm.) .  Patient Goals and CMS Choice Patient states their goals for this hospitalization and ongoing recovery are:: To go to the shelter and try to find somewhere to live. CMS Medicare.gov Compare Post Acute Care list provided to:: Patient Choice offered to / list presented to : Patient  Discharge Placement                 Homeless shelter      Discharge Plan and Services   Discharge Planning Services: CM Consult              DME Agency: NA                  Social Determinants of Health (SDOH) Interventions     Readmission Risk Interventions No flowsheet data found.

## 2022-01-17 NOTE — Progress Notes (Signed)
Occupational Therapy Treatment Patient Details Name: Jason Buchanan MRN: [id number] DOB: 02-Aug-1986 Today's Date: 01/17/2022   History of present illness Pt is 36 yo male presented on 12/12 with bil UE edema/erythema after IV drug use.  Pt required R hand fasciotomy, carpal tunnel release, 3 compartment forearm fasciotomy, Guyon's canal release , and I and D hand/forearm on 12/15; and L wrist I and D, left volar forearm fasciotmoy, and L wrist carpal tunnel release on 12/13. On 12/14 concern for GIB and required 3 untis PRBC with ED 12/15. on 2/1 patient underwent skin graft on R forearm from R thigh.  Pt with hx of polysubstance abuse.   OT comments  Patient participated in ROM of bilateral wrists and digits. Patient was noted to have improved AROM for supination and pronation bilaterally. Patient noted to have continued edema in MCPs with limited flexion available. Patient educated on keeping BUE elevated to reduce edema. Patient verbalized understanding. Patient reported plan was to leave hospital today. Patient was educated on outpatient and importance of follow up appointments. Patient verbalized understanding. Patient would continue to benefit from skilled OT services at this time while admitted and after d/c to address noted deficits in order to improve overall safety and independence in ADLs.     Recommendations for follow up therapy are one component of a multi-disciplinary discharge planning process, led by the attending physician.  Recommendations may be updated based on patient status, additional functional criteria and insurance authorization.    Follow Up Recommendations  Outpatient OT    Assistance Recommended at Discharge PRN  Patient can return home with the following  Assistance with cooking/housework;A lot of help with bathing/dressing/bathroom;Direct supervision/assist for medications management   Equipment Recommendations  None recommended by OT    Recommendations for  Other Services      Precautions / Restrictions Precautions Precautions: Fall Precaution Comments: skin graft R forearm Restrictions Weight Bearing Restrictions: No Other Position/Activity Restrictions: Dr.Luppens 2/6 via secure chat- ok to start ranging freely.       Mobility Bed Mobility               General bed mobility comments: patient was sitting up in recliner at this time.    Transfers                         Balance                                           ADL either performed or assessed with clinical judgement   ADL                                              Extremity/Trunk Assessment Upper Extremity Assessment RUE Deficits / Details: patient able to tolerate ROM of digits but limited ability to activly complete ROM.            Vision       Perception     Praxis      Cognition Arousal/Alertness: Awake/alert Behavior During Therapy: WFL for tasks assessed/performed Overall Cognitive Status: Within Functional Limits for tasks assessed  Exercises      Shoulder Instructions       General Comments      Pertinent Vitals/ Pain       Pain Assessment Pain Assessment: Faces Faces Pain Scale: Hurts little more Pain Location: with stretching of digits and wrist Pain Descriptors / Indicators: Discomfort, Grimacing, Guarding, Restless Pain Intervention(s): Monitored during session, Premedicated before session  Home Living                                          Prior Functioning/Environment              Frequency  Min 2X/week        Progress Toward Goals  OT Goals(current goals can now be found in the care plan section)  Progress towards OT goals: OT to reassess next treatment     Plan Discharge plan remains appropriate    Co-evaluation                 AM-PAC OT "6 Clicks" Daily Activity      Outcome Measure   Help from another person eating meals?: None Help from another person taking care of personal grooming?: None Help from another person toileting, which includes using toliet, bedpan, or urinal?: None Help from another person bathing (including washing, rinsing, drying)?: None Help from another person to put on and taking off regular upper body clothing?: None Help from another person to put on and taking off regular lower body clothing?: None 6 Click Score: 24    End of Session    OT Visit Diagnosis: Muscle weakness (generalized) (M62.81);Pain   Activity Tolerance Patient tolerated treatment well   Patient Left with call bell/phone within reach;in chair   Nurse Communication Mobility status        Time: 1557-1611 OT Time Calculation (min): 14 min  Charges: OT General Charges $OT Visit: 1 Visit OT Treatments $Therapeutic Activity: 8-22 mins  Sharyn Blitz OTR/L, MS Acute Rehabilitation Department Office# [phone] Pager# [phone]   Ardyth Harps 01/17/2022, 4:16 PM

## 2022-01-17 NOTE — Progress Notes (Signed)
Nutrition Follow-up  DOCUMENTATION CODES:   Severe malnutrition in context of acute illness/injury  INTERVENTION:   -Recommend new weight for admission  -Ensure Enlive po TID, each supplement provides 350 kcal and 20 grams of protein    -Multivitamin with minerals daily   -500 mg Vitamin daily   -Prosource Plus PO TID, each provides 100 kcals and 15g protein  NUTRITION DIAGNOSIS:   Severe Malnutrition related to acute illness, wound healing as evidenced by severe fat depletion, severe muscle depletion.  Ongoing.  GOAL:   Patient will meet greater than or equal to 90% of their needs  Progressing.   MONITOR:   PO intake, Supplement acceptance, Labs, Weight trends, I & O's, Skin  ASSESSMENT:   36 year old man with history of Polysubstance abuse who presented to the hospital with arm swelling, erythema and pain. Patient is a poor historian but  reports history of injecting multiple drugs in both arms and currently receiving treatment for severe sepsis of right upper extremities complicated with streptococcus bacteremia.  12/12 admitted, NPO ->HH diet 12/13: NPO, s/p I&D of left wrist deep abscess, left volar forearm fasciotomy, left wrist carpal tunnel release 12/14: Regular ->NPO 12/15: s/p right hand fasciotomy, right hand carpal tunnel release, right forearm fasciotomy, I&D of hand and forearm, s/p EGD -esophagitis, gastric and duodenal ulcers 12/17: regular diet 12/20: I&D of BUEs 12/23: I&D of bilateral arms 12/29: I&D of bilateral arms w/ Wound VAC  1/4: Wound VAC change, I&D of deep abscess of rt arm 1/5: I&D of LUE, left hand  1/11: s/p debridement of rt arm, skin graft, wound VAC 1/19: s/p debridement, skin graft right arm, Wound VAC placed 1/26: s/p debridement of right arm, wound VAC placed  Patient currently consuming 100% of meals.  Accepting protein supplements most of the time.   Admission weight: 132 lbs. Last weight 1/26: 172 lbs Needs updated  weight for admission.   I/Os:  -28L since 1/31 UOP: 4250 ml x 24 hrs  Medications: Vitamin C, Ferrous sulfate, MAG-OX, Miralax  Labs reviewed.  Diet Order:   Diet Order             Diet regular Room service appropriate? Yes; Fluid consistency: Thin  Diet effective now                   EDUCATION NEEDS:   Education needs have been addressed  Skin:  Skin Assessment: Skin Integrity Issues: Skin Integrity Issues:: Incisions Incisions: 12/13: bilateral arms  12/15: right arm 12/20 & 12/23: bilateral arms, 12/29: left arm, 1/4,1/11,1/19, 1/26: rt upper arm, 1/31: right leg, 2/9 rt thigh  Last BM:  2/13 -type 3  Height:   Ht Readings from Last 1 Encounters:  11/25/21 5\' 7"  (1.702 m)    Weight:   Wt Readings from Last 1 Encounters:  12/29/21 78.2 kg    BMI:  Body mass index is 27 kg/m.  Estimated Nutritional Needs:   Kcal:  1800-2000  Protein:  95-110g  Fluid:  2L/day   12/31/21, MS, RD, LDN Inpatient Clinical Dietitian Contact information available via Amion

## 2022-01-27 ENCOUNTER — Encounter: Payer: Self-pay | Admitting: *Deleted

## 2022-01-27 NOTE — Congregational Nurse Program (Signed)
°  Dept: [phone]   Congregational Nurse Program Note  Date of Encounter: 01/27/2022  Past Medical History: Past Medical History:  Diagnosis Date   Asthma    Mood disorder ([place])    Polysubstance abuse ([place])     Encounter Details:  CNP Questionnaire - 01/27/22 1440       Questionnaire   Do you give verbal consent to treat you today? Yes    Location Patient Served  [facility]    Visit Setting Church or Organization;Phone/Text/Email    Patient Status Homeless    Sport and exercise psychologist or [employer] Referral N/A    Medication N/A    Medical Provider Yes    Screening Referrals N/A    Medical Referral N/A    Medical Appointment Made N/A    Food N/A    Transportation Provided transportation assistance    Housing/Utilities No permanent housing    Interpersonal Safety Referred to domestic shelter    Intervention Support    ED Visit Averted N/A    Life-Saving Intervention Made N/A            Client spoke with [facility] this morning requesting help with housing. Client accepted for intake at shelter in [facility] for 5:00. Gave transportation release paper for client to sign to [facility]. Client will be back at [facility] this afternoon. Set up transportation for 4:00. Sims Laday W RN CN

## 2022-02-27 ENCOUNTER — Other Ambulatory Visit: Payer: Self-pay

## 2022-02-27 ENCOUNTER — Emergency Department (HOSPITAL_COMMUNITY)
Admission: EM | Admit: 2022-02-27 | Discharge: 2022-02-27 | Disposition: A | Discharge status: Home / Self Care | Payer: 59 | Attending: Emergency Medicine | Admitting: Emergency Medicine

## 2022-02-27 ENCOUNTER — Emergency Department (HOSPITAL_COMMUNITY): Payer: 59

## 2022-02-27 ENCOUNTER — Encounter (HOSPITAL_COMMUNITY): Payer: Self-pay | Admitting: Emergency Medicine

## 2022-02-27 DIAGNOSIS — X58XXXA Exposure to other specified factors, initial encounter: Secondary | ICD-10-CM | POA: Diagnosis not present

## 2022-02-27 DIAGNOSIS — R000 Tachycardia, unspecified: Secondary | ICD-10-CM | POA: Diagnosis not present

## 2022-02-27 DIAGNOSIS — Z79899 Other long term (current) drug therapy: Secondary | ICD-10-CM | POA: Diagnosis not present

## 2022-02-27 DIAGNOSIS — Y900 Blood alcohol level of less than 20 mg/100 ml: Secondary | ICD-10-CM | POA: Diagnosis not present

## 2022-02-27 DIAGNOSIS — F419 Anxiety disorder, unspecified: Secondary | ICD-10-CM | POA: Insufficient documentation

## 2022-02-27 DIAGNOSIS — T185XXA Foreign body in anus and rectum, initial encounter: Secondary | ICD-10-CM | POA: Insufficient documentation

## 2022-02-27 DIAGNOSIS — F1910 Other psychoactive substance abuse, uncomplicated: Secondary | ICD-10-CM

## 2022-02-27 LAB — COMPREHENSIVE METABOLIC PANEL
ALT: 53 U/L — ABNORMAL HIGH (ref 0–44)
AST: 54 U/L — ABNORMAL HIGH (ref 15–41)
Albumin: 4 g/dL (ref 3.5–5.0)
Alkaline Phosphatase: 167 U/L — ABNORMAL HIGH (ref 38–126)
Anion gap: 12 (ref 5–15)
BUN: 14 mg/dL (ref 6–20)
CO2: 20 mmol/L — ABNORMAL LOW (ref 22–32)
Calcium: 9.6 mg/dL (ref 8.9–10.3)
Chloride: 101 mmol/L (ref 98–111)
Creatinine, Ser: 0.63 mg/dL (ref 0.61–1.24)
GFR, Estimated: >60 mL/min (ref >60)
Glucose, Bld: 92 mg/dL (ref 70–99)
Potassium: 3.9 mmol/L (ref 3.5–5.1)
Sodium: 133 mmol/L — ABNORMAL LOW (ref 135–145)
Total Bilirubin: 1.1 mg/dL (ref 0.3–1.2)
Total Protein: 7.4 g/dL (ref 6.5–8.1)

## 2022-02-27 LAB — CBC WITH DIFFERENTIAL/PLATELET
Abs Immature Granulocytes: 0.04 10*3/uL (ref 0.00–0.07)
Basophils Absolute: 0 10*3/uL (ref 0.0–0.1)
Basophils Relative: 0 %
Eosinophils Absolute: 0.1 10*3/uL (ref 0.0–0.5)
Eosinophils Relative: 0 %
HCT: 42.3 % (ref 39.0–52.0)
Hemoglobin: 13.7 g/dL (ref 13.0–17.0)
Immature Granulocytes: 0 %
Lymphocytes Relative: 12 %
Lymphs Abs: 1.4 10*3/uL (ref 0.7–4.0)
MCH: 26.5 pg (ref 26.0–34.0)
MCHC: 32.4 g/dL (ref 30.0–36.0)
MCV: 81.8 fL (ref 80.0–100.0)
Monocytes Absolute: 0.9 10*3/uL (ref 0.1–1.0)
Monocytes Relative: 7 %
Neutro Abs: 9.4 10*3/uL — ABNORMAL HIGH (ref 1.7–7.7)
Neutrophils Relative %: 81 %
Platelets: 452 10*3/uL — ABNORMAL HIGH (ref 150–400)
RBC: 5.17 MIL/uL (ref 4.22–5.81)
RDW: 14.2 % (ref 11.5–15.5)
WBC: 11.8 10*3/uL — ABNORMAL HIGH (ref 4.0–10.5)
nRBC: 0 % (ref 0.0–0.2)

## 2022-02-27 LAB — RAPID URINE DRUG SCREEN, HOSP PERFORMED
Amphetamines: POSITIVE — AB
Barbiturates: NOT DETECTED
Benzodiazepines: NOT DETECTED
Cocaine: POSITIVE — AB
Opiates: NOT DETECTED
Tetrahydrocannabinol: POSITIVE — AB

## 2022-02-27 LAB — LIPASE, BLOOD: Lipase: 26 U/L (ref 11–51)

## 2022-02-27 LAB — ETHANOL: Alcohol, Ethyl (B): <10 mg/dL (ref <10)

## 2022-02-27 MED ORDER — SODIUM CHLORIDE 0.9 % IV BOLUS
1000.0000 mL | Freq: Once | INTRAVENOUS | Status: AC
  Administered 2022-02-27: 1000 mL via INTRAVENOUS

## 2022-02-27 MED ORDER — LORAZEPAM 2 MG/ML IJ SOLN
1.0000 mg | Freq: Once | INTRAMUSCULAR | Status: AC
  Administered 2022-02-27: 1 mg via INTRAVENOUS
  Filled 2022-02-27: qty 1

## 2022-02-27 MED ORDER — IOHEXOL 300 MG/ML  SOLN
100.0000 mL | Freq: Once | INTRAMUSCULAR | Status: AC | PRN
  Administered 2022-02-27: 100 mL via INTRAVENOUS

## 2022-02-27 MED ORDER — HALOPERIDOL LACTATE 5 MG/ML IJ SOLN
2.0000 mg | Freq: Once | INTRAMUSCULAR | Status: AC
Start: 2022-02-27 — End: 2022-02-27
  Administered 2022-02-27: 2 mg via INTRAVENOUS
  Filled 2022-02-27: qty 1

## 2022-02-27 NOTE — ED Notes (Signed)
Pt woken to prepare for discharge. Given meal. Patient walks frantically around room, though stable on feet and alert and oriented x4. Patient asks for bus pass. None available at charge desk. Brought to lobby in wheelchair for safety. Patient thanks this RN and reports no further needs.  ?

## 2022-02-27 NOTE — ED Provider Notes (Signed)
Patient signed out to me is pending concern for retained foreign body in his rectum.  X-ray was equivocal. ? ?However CT abdomen pelvis shows no foreign body noted.  Patient advised outpatient follow-up with his doctor this week.  Advised immediate return if he has fevers vomiting cough diarrhea or any additional concerns. ?  ?Cheryll Cockayne, MD ?02/27/22 1831 ? ?

## 2022-02-27 NOTE — ED Provider Triage Note (Signed)
Emergency Medicine Provider Triage Evaluation Note ? ?Jason Buchanan , a 36 y.o. male  was evaluated in triage.  Pt complains of foreign body inside rectum.  Patient reports that 2 days prior he placed a bag which he suspects contained heroin and methamphetamines inside his rectum.  Patient has been able to get the bag out of his rectum since then.  States that he has not had a bowel movement in the last 2 days.  He does endorse that he has had nausea and vomiting over the last 2 days and has not been able to hold down any food or liquids.  Patient denies any other drug use.  Denies any alcohol use. ? ?Patient denies any rectal pain, rectal bleeding, abdominal pain, hematemesis, coffee-ground emesis. ? ?Review of Systems  ?Positive: Foreign body in rectum, nausea, vomiting ?Negative: See above ? ?Physical Exam  ?BP (!) 138/110   Pulse (!) 139   Temp 99.2 ?F (37.3 ?C)   Resp (!) 21   SpO2 94%  ?Gen:   Awake, no distress   ?Resp:  Normal effort  ?MSK:   Moves extremities without difficulty  ?Other:  Patient is extremely anxious and fidgety.  Has difficulty staying still during assessment. ? ?Medical Decision Making  ?Medically screening exam initiated at 1:44 PM.  Appropriate orders placed.  Jason Buchanan was informed that the remainder of the evaluation will be completed by another provider, this initial triage assessment does not replace that evaluation, and the importance of remaining in the ED until their evaluation is complete. ? ? ?  ?Loni Beckwith, PA-C ?02/27/22 1346 ? ?

## 2022-02-27 NOTE — Discharge Instructions (Signed)
Call your primary care doctor or specialist as discussed in the next 2-3 days.   Return immediately back to the ER if:  Your symptoms worsen within the next 12-24 hours. You develop new symptoms such as new fevers, persistent vomiting, new pain, shortness of breath, or new weakness or numbness, or if you have any other concerns.  

## 2022-02-27 NOTE — ED Notes (Signed)
Pt transported to Xray, asleep at time of transport, VS stable. ?

## 2022-02-27 NOTE — ED Triage Notes (Signed)
Patient coming from home. Patient reports sticking a baggy of meth and heroin up his rectum on Friday. States for past few days has been trying to get it out, states he has been unable to have a bowel movement since. Pt extremely anxious and fidgeting at triage. A&Ox4.  ?

## 2022-02-27 NOTE — ED Notes (Signed)
Patient unable to be still at this time, patient remains restless and fidgety.  ?

## 2022-02-27 NOTE — ED Provider Notes (Signed)
?[facility] EMERGENCY DEPARTMENT ?Provider Note ? ? ?CSN: [id number] ?Arrival date & time: 02/27/22  1313 ? ?  ? ?History ? ?Chief Complaint  ?Patient presents with  ? Ingestion  ? ? ?Jason Buchanan is a 36 y.o. male. ? ? ?Ingestion ?Pertinent negatives include no shortness of breath.  ?Patient presents after rectal foreign body.  Reportedly 2 days ago stuck a bag which, pain and heroin and methamphetamines in his rectum.  States it was the driver's but he placed it there.  States there was about 3 smaller bags in it.  States now feeling more anxious.  Does have a history of some methamphetamine use at times.  States he just feels very anxious.  No abdominal pain.  States he has not had a bowel movement.  Has been unable to get the bag out.  Patient is also been off of his Seroquel for around 4 to ?  ? ?Home Medications ?Prior to Admission medications   ?Medication Sig Start Date End Date Taking? Authorizing Provider  ?amitriptyline (ELAVIL) 25 MG tablet Take 1 tablet (25 mg total) by mouth at bedtime. 01/17/22 02/27/22 Yes Jerald Kief, MD  ?ferrous sulfate 325 (65 FE) MG tablet Take 1 tablet (325 mg total) by mouth daily with breakfast. 01/18/22 04/08/22 Yes Jerald Kief, MD  ?gabapentin (NEURONTIN) 400 MG capsule Take 1 capsule (400 mg total) by mouth 3 (three) times daily. ?Patient taking differently: Take 400 mg by mouth 3 (three) times daily as needed (pain). 01/17/22 04/08/22 Yes Jerald Kief, MD  ?ibuprofen (ADVIL) 200 MG tablet Take 800 mg by mouth daily as needed for mild pain or moderate pain.   Yes [provider]  ?mupirocin ointment (BACTROBAN) 2 % Apply 1 application. topically 2 (two) times daily. 02/01/22  Yes [provider]  ?pantoprazole (PROTONIX) 40 MG tablet Take 1 tablet (40 mg total) by mouth daily. 01/18/22 04/08/22 Yes Jerald Kief, MD  ?QUEtiapine (SEROQUEL XR) 300 MG 24 hr tablet Take 1 tablet (300 mg total) by mouth at bedtime. ?Patient taking  differently: Take 150 mg by mouth at bedtime. 01/17/22 04/08/22 Yes Jerald Kief, MD  ?methocarbamol (ROBAXIN) 500 MG tablet Take 1 tablet (500 mg total) by mouth every 8 (eight) hours as needed for muscle spasms. ?Patient not taking: Reported on 02/27/2022 01/17/22   Jerald Kief, MD  ?oxycodone (OXY-IR) 5 MG capsule Take 1 capsule (5 mg total) by mouth every 4 (four) hours as needed. ?Patient not taking: Reported on 02/27/2022 01/17/22   Jerald Kief, MD  ?   ? ?Allergies    ?Bee venom, Succinylcholine, Chlorhexidine, and Naproxen   ? ?Review of Systems   ?Review of Systems  ?Constitutional:  Negative for chills.  ?Respiratory:  Negative for shortness of breath.   ?Musculoskeletal:  Negative for back pain.  ?Neurological:  Negative for weakness.  ?Psychiatric/Behavioral:  The patient is nervous/anxious.   ? ?Physical Exam ?Updated Vital Signs ?BP (!) 119/95   Pulse (!) 108   Temp 99.2 ?F (37.3 ?C)   Resp (!) 22   SpO2 100%  ?Physical Exam ?Vitals and nursing note reviewed.  ?HENT:  ?   Head: Normocephalic.  ?Eyes:  ?   Pupils: Pupils are equal, round, and reactive to light.  ?Cardiovascular:  ?   Rate and Rhythm: Tachycardia present.  ?Pulmonary:  ?   Breath sounds: No wheezing.  ?Abdominal:  ?   Tenderness: There is no abdominal tenderness.  ?  Genitourinary: ?   Comments: Normal rectal exam.  No foreign body felt. ?Musculoskeletal:     ?   General: No tenderness.  ?   Cervical back: Neck supple.  ?Skin: ?   General: Skin is warm.  ?   Capillary Refill: Capillary refill takes less than 2 seconds.  ?Neurological:  ?   Mental Status: He is alert and oriented to person, place, and time.  ?   Comments: Patient very anxious.  Moving all over the bed.  ? ? ?ED Results / Procedures / Treatments   ?Labs ?(all labs ordered are listed, but only abnormal results are displayed) ?Labs Reviewed  ?CBC WITH DIFFERENTIAL/PLATELET - Abnormal; Notable for the following components:  ?    Result Value  ? WBC 11.8 (*)   ?  Platelets 452 (*)   ? Neutro Abs 9.4 (*)   ? All other components within normal limits  ?COMPREHENSIVE METABOLIC PANEL - Abnormal; Notable for the following components:  ? Sodium 133 (*)   ? CO2 20 (*)   ? AST 54 (*)   ? ALT 53 (*)   ? Alkaline Phosphatase 167 (*)   ? All other components within normal limits  ?LIPASE, BLOOD  ?ETHANOL  ?RAPID URINE DRUG SCREEN, HOSP PERFORMED  ? ? ?EKG ?None ? ?Radiology ?No results found. ? ?Procedures ?Procedures  ? ? ?Medications Ordered in ED ?Medications  ?sodium chloride 0.9 % bolus 1,000 mL (1,000 mLs Intravenous New Bag/Given 02/27/22 1450)  ?LORazepam (ATIVAN) injection 1 mg (1 mg Intravenous Given 02/27/22 1451)  ?haloperidol lactate (HALDOL) injection 2 mg (2 mg Intravenous Given 02/27/22 1527)  ? ? ?ED Course/ Medical Decision Making/ A&P ?  ?                        ?Medical Decision Making ?Amount and/or Complexity of Data Reviewed ?Radiology: ordered. ? ?Risk ?Prescription drug management. ? ? ?Patient with potential Rectal foreign body of drugs.  Reportedly was in bags.  Placed 2 days ago.  Now anxious with movement of all his extremities.  Has also been off his psych medicines.  Rectal exam done and did not show foreign body.  Fluid given and heart rate is improved some.  Also given some Ativan we will now add some Haldol.  We will also get x-ray to evaluate for visible foreign body.  Care turned over to Dr. Audley Hose. ? ? ? ? ? ? ? ?Final Clinical Impression(s) / ED Diagnoses ?Final diagnoses:  ?None  ? ? ?Rx / DC Orders ?ED Discharge Orders   ? ? None  ? ?  ? ? ?  ?Benjiman Core, MD ?02/27/22 1547 ? ?

## 2022-02-28 ENCOUNTER — Emergency Department (HOSPITAL_COMMUNITY)
Admission: EM | Admit: 2022-02-28 | Discharge: 2022-02-28 | Disposition: A | Discharge status: Home / Self Care | Payer: 59 | Attending: Emergency Medicine | Admitting: Emergency Medicine

## 2022-02-28 ENCOUNTER — Other Ambulatory Visit: Payer: Self-pay

## 2022-02-28 ENCOUNTER — Encounter (HOSPITAL_COMMUNITY): Payer: Self-pay | Admitting: Pharmacy Technician

## 2022-02-28 DIAGNOSIS — Z79899 Other long term (current) drug therapy: Secondary | ICD-10-CM | POA: Insufficient documentation

## 2022-02-28 DIAGNOSIS — F1410 Cocaine abuse, uncomplicated: Secondary | ICD-10-CM | POA: Diagnosis not present

## 2022-02-28 DIAGNOSIS — Z20822 Contact with and (suspected) exposure to covid-19: Secondary | ICD-10-CM | POA: Insufficient documentation

## 2022-02-28 DIAGNOSIS — F419 Anxiety disorder, unspecified: Secondary | ICD-10-CM | POA: Diagnosis not present

## 2022-02-28 DIAGNOSIS — F1590 Other stimulant use, unspecified, uncomplicated: Secondary | ICD-10-CM | POA: Insufficient documentation

## 2022-02-28 DIAGNOSIS — F1990 Other psychoactive substance use, unspecified, uncomplicated: Secondary | ICD-10-CM

## 2022-02-28 DIAGNOSIS — Z5900 Homelessness unspecified: Secondary | ICD-10-CM | POA: Diagnosis not present

## 2022-02-28 DIAGNOSIS — F1910 Other psychoactive substance abuse, uncomplicated: Secondary | ICD-10-CM | POA: Diagnosis not present

## 2022-02-28 DIAGNOSIS — F1510 Other stimulant abuse, uncomplicated: Secondary | ICD-10-CM

## 2022-02-28 LAB — SALICYLATE LEVEL: Salicylate Lvl: <7 mg/dL — ABNORMAL LOW (ref 7.0–30.0)

## 2022-02-28 LAB — CBG MONITORING, ED: Glucose-Capillary: 117 mg/dL — ABNORMAL HIGH (ref 70–99)

## 2022-02-28 LAB — CBC WITH DIFFERENTIAL/PLATELET
Abs Immature Granulocytes: 0.01 10*3/uL (ref 0.00–0.07)
Basophils Absolute: 0.1 10*3/uL (ref 0.0–0.1)
Basophils Relative: 1 %
Eosinophils Absolute: 0.3 10*3/uL (ref 0.0–0.5)
Eosinophils Relative: 3 %
HCT: 42.3 % (ref 39.0–52.0)
Hemoglobin: 13.1 g/dL (ref 13.0–17.0)
Immature Granulocytes: 0 %
Lymphocytes Relative: 33 %
Lymphs Abs: 2.7 10*3/uL (ref 0.7–4.0)
MCH: 25.8 pg — ABNORMAL LOW (ref 26.0–34.0)
MCHC: 31 g/dL (ref 30.0–36.0)
MCV: 83.4 fL (ref 80.0–100.0)
Monocytes Absolute: 1 10*3/uL (ref 0.1–1.0)
Monocytes Relative: 13 %
Neutro Abs: 4 10*3/uL (ref 1.7–7.7)
Neutrophils Relative %: 50 %
Platelets: 412 10*3/uL — ABNORMAL HIGH (ref 150–400)
RBC: 5.07 MIL/uL (ref 4.22–5.81)
RDW: 14.6 % (ref 11.5–15.5)
WBC: 8.1 10*3/uL (ref 4.0–10.5)
nRBC: 0 % (ref 0.0–0.2)

## 2022-02-28 LAB — RAPID URINE DRUG SCREEN, HOSP PERFORMED
Amphetamines: POSITIVE — AB
Barbiturates: NOT DETECTED
Benzodiazepines: NOT DETECTED
Cocaine: NOT DETECTED
Opiates: NOT DETECTED
Tetrahydrocannabinol: POSITIVE — AB

## 2022-02-28 LAB — COMPREHENSIVE METABOLIC PANEL
ALT: 50 U/L — ABNORMAL HIGH (ref 0–44)
AST: 50 U/L — ABNORMAL HIGH (ref 15–41)
Albumin: 3.6 g/dL (ref 3.5–5.0)
Alkaline Phosphatase: 158 U/L — ABNORMAL HIGH (ref 38–126)
Anion gap: 9 (ref 5–15)
BUN: 5 mg/dL — ABNORMAL LOW (ref 6–20)
CO2: 24 mmol/L (ref 22–32)
Calcium: 9.1 mg/dL (ref 8.9–10.3)
Chloride: 103 mmol/L (ref 98–111)
Creatinine, Ser: 0.56 mg/dL — ABNORMAL LOW (ref 0.61–1.24)
GFR, Estimated: >60 mL/min (ref >60)
Glucose, Bld: 128 mg/dL — ABNORMAL HIGH (ref 70–99)
Potassium: 3.4 mmol/L — ABNORMAL LOW (ref 3.5–5.1)
Sodium: 136 mmol/L (ref 135–145)
Total Bilirubin: 0.3 mg/dL (ref 0.3–1.2)
Total Protein: 7 g/dL (ref 6.5–8.1)

## 2022-02-28 LAB — RESP PANEL BY RT-PCR (FLU A&B, COVID) ARPGX2
Influenza A by PCR: NEGATIVE
Influenza B by PCR: NEGATIVE
SARS Coronavirus 2 by RT PCR: NEGATIVE

## 2022-02-28 LAB — ACETAMINOPHEN LEVEL: Acetaminophen (Tylenol), Serum: 33 ug/mL — ABNORMAL HIGH (ref 10–30)

## 2022-02-28 LAB — ETHANOL: Alcohol, Ethyl (B): <10 mg/dL (ref <10)

## 2022-02-28 MED ORDER — POTASSIUM CHLORIDE CRYS ER 20 MEQ PO TBCR
40.0000 meq | EXTENDED_RELEASE_TABLET | Freq: Once | ORAL | Status: AC
  Administered 2022-02-28: 40 meq via ORAL
  Filled 2022-02-28: qty 2

## 2022-02-28 MED ORDER — LORAZEPAM 1 MG PO TABS
1.0000 mg | ORAL_TABLET | Freq: Once | ORAL | Status: AC
Start: 2022-02-28 — End: 2022-02-28
  Administered 2022-02-28: 1 mg via ORAL
  Filled 2022-02-28: qty 1

## 2022-02-28 NOTE — ED Provider Triage Note (Signed)
Emergency Medicine Provider Triage Evaluation Note ? ?Jason Buchanan , a 36 y.o. male  was evaluated in triage.  Pt complains of psychiatric problems.  He said he ran out of his medications 3 days ago.  Over the past 3 days he has felt much more impulsive and erratic than normal.  He states that he has had suicidal ideations and wanted to jump off a bridge yesterday but could not bring himself to do that.  He says he has had hallucinations.  He states that when he is on his medications he is doing a lot better, but he is still having problems.  He recently had cellulitis due to IV drug use back in January and had surgery for this.  He states he has not used IV drugs since then, but he is still using some oral drugs.  He says about 4 days ago he last used heroin and meth.  He says that he put these in his rectum.  He has not used since then. ? ?Review of Systems  ?Positive:  ?Negative:  ? ?Physical Exam  ?BP (!) 148/88 (BP Location: Right Arm)   Pulse (!) 103   Temp 99 ?F (37.2 ?C) (Oral)   Resp 18   SpO2 100%  ?Gen:   Awake, no distress   ?Resp:  Normal effort  ?MSK:   Moves extremities without difficulty  ?Other:  Tearful, pacing around room, appears anxious ? ?Medical Decision Making  ?Medically screening exam initiated at 3:11 PM.  Appropriate orders placed.  Jason Buchanan was informed that the remainder of the evaluation will be completed by another provider, this initial triage assessment does not replace that evaluation, and the importance of remaining in the ED until their evaluation is complete. ? ? ?  ?Claudie Leach, PA-C ?02/28/22 1512 ? ?

## 2022-02-28 NOTE — ED Notes (Signed)
Pts belongings placed in locker number 1 along with big black bag infront of lockers ?

## 2022-02-28 NOTE — ED Notes (Signed)
Security wanded pt ?

## 2022-02-28 NOTE — Discharge Instructions (Addendum)
It was our pleasure to provide your ER care today - we hope that you feel better. ? ?Avoid drug use as it is harmful to your physical health and mental well-being.  See resource guide provided in terms of accessing substance use treatment programs and other social services in the area.  ? ?Also follow up with primary care doctor in the next couple weeks for recheck of blood pressure, which is high today. ? ?For mental health issues and/or crisis, you may go directly to the [facility], it is open 24/7, and walk-ins are welcome.  ? ?You were given meds in the ER - no driving for the next 6 hours.  ?

## 2022-02-28 NOTE — ED Triage Notes (Signed)
Pt here with reports of running out of he psych meds several days ago. Pt endorses visual hallucinations and SI. Pt states yesterday he wanted to jump off of a bridge.  ?

## 2022-02-28 NOTE — ED Provider Notes (Signed)
?[facility] EMERGENCY DEPARTMENT ?Provider Note ? ? ?CSN: [id number] ?Arrival date & time: 02/28/22  1431 ? ?  ? ?History ? ?Chief Complaint  ?Patient presents with  ? Addiction Problem  ? ? ?Jason Buchanan is a 36 y.o. male. ? ?Patient c/o substance use disorder, indicates is interested in pursuing rehab program. States using for many months/yrs. Last use in past day, meth and cocaine. Also indicates is feeling anxious and hungry now. Homeless and has not eaten today. Denies severe depression or thoughts of self harm. No hallucinations. Denies fever or chills. No headache. No chest pain or sob. No abd pain or nvd.  ? ?The history is provided by the patient and medical records.  ? ?  ? ?Home Medications ?Prior to Admission medications   ?Medication Sig Start Date End Date Taking? Authorizing Provider  ?amitriptyline (ELAVIL) 25 MG tablet Take 1 tablet (25 mg total) by mouth at bedtime. 01/17/22 02/27/22  Jerald Kief, MD  ?ferrous sulfate 325 (65 FE) MG tablet Take 1 tablet (325 mg total) by mouth daily with breakfast. 01/18/22 04/08/22  Jerald Kief, MD  ?gabapentin (NEURONTIN) 400 MG capsule Take 1 capsule (400 mg total) by mouth 3 (three) times daily. ?Patient taking differently: Take 400 mg by mouth 3 (three) times daily as needed (pain). 01/17/22 04/08/22  Jerald Kief, MD  ?ibuprofen (ADVIL) 200 MG tablet Take 800 mg by mouth daily as needed for mild pain or moderate pain.    [provider]  ?methocarbamol (ROBAXIN) 500 MG tablet Take 1 tablet (500 mg total) by mouth every 8 (eight) hours as needed for muscle spasms. ?Patient not taking: Reported on 02/27/2022 01/17/22   Jerald Kief, MD  ?mupirocin ointment (BACTROBAN) 2 % Apply 1 application. topically 2 (two) times daily. 02/01/22   [provider]  ?oxycodone (OXY-IR) 5 MG capsule Take 1 capsule (5 mg total) by mouth every 4 (four) hours as needed. ?Patient not taking: Reported on 02/27/2022 01/17/22   Jerald Kief, MD   ?pantoprazole (PROTONIX) 40 MG tablet Take 1 tablet (40 mg total) by mouth daily. 01/18/22 04/08/22  Jerald Kief, MD  ?QUEtiapine (SEROQUEL XR) 300 MG 24 hr tablet Take 1 tablet (300 mg total) by mouth at bedtime. ?Patient taking differently: Take 150 mg by mouth at bedtime. 01/17/22 04/08/22  Jerald Kief, MD  ?   ? ?Allergies    ?Bee venom, Succinylcholine, Chlorhexidine, and Naproxen   ? ?Review of Systems   ?Review of Systems  ?Constitutional:  Negative for appetite change and fever.  ?HENT:  Negative for sore throat.   ?Eyes:  Negative for redness and visual disturbance.  ?Respiratory:  Negative for shortness of breath.   ?Cardiovascular:  Negative for chest pain.  ?Gastrointestinal:  Negative for abdominal pain, diarrhea and vomiting.  ?Genitourinary:  Negative for flank pain.  ?Musculoskeletal:  Negative for back pain and neck pain.  ?Skin:  Negative for rash.  ?Neurological:  Negative for headaches.  ?Hematological:  Does not bruise/bleed easily.  ?Psychiatric/Behavioral:  Negative for confusion and self-injury. The patient is nervous/anxious.   ? ?Physical Exam ?Updated Vital Signs ?BP (!) 148/88 (BP Location: Right Arm)   Pulse (!) 103   Temp 99 ?F (37.2 ?C) (Oral)   Resp 18   SpO2 100%  ?Physical Exam ?Vitals and nursing note reviewed.  ?Constitutional:   ?   Appearance: Normal appearance. He is well-developed.  ?HENT:  ?   Head: Atraumatic.  ?  Nose: Nose normal.  ?   Mouth/Throat:  ?   Mouth: Mucous membranes are moist.  ?Eyes:  ?   General: No scleral icterus. ?   Conjunctiva/sclera: Conjunctivae normal.  ?   Pupils: Pupils are equal, round, and reactive to light.  ?Neck:  ?   Trachea: No tracheal deviation.  ?Cardiovascular:  ?   Rate and Rhythm: Normal rate and regular rhythm.  ?   Pulses: Normal pulses.  ?   Heart sounds: Normal heart sounds. No murmur heard. ?  No friction rub. No gallop.  ?Pulmonary:  ?   Effort: Pulmonary effort is normal. No accessory muscle usage or respiratory distress.   ?   Breath sounds: Normal breath sounds.  ?Abdominal:  ?   General: Bowel sounds are normal. There is no distension.  ?   Palpations: Abdomen is soft.  ?   Tenderness: There is no abdominal tenderness.  ?Genitourinary: ?   Comments: No cva tenderness. ?Musculoskeletal:     ?   General: No swelling or tenderness.  ?   Cervical back: Normal range of motion and neck supple. No rigidity.  ?Skin: ?   General: Skin is warm and dry.  ?   Findings: No rash.  ?Neurological:  ?   Mental Status: He is alert.  ?   Comments: Alert, speech clear. Motor/sens grossly intact bil. Steady gait.  ?Psychiatric:     ?   Mood and Affect: Mood normal.  ? ? ?ED Results / Procedures / Treatments   ?Labs ?(all labs ordered are listed, but only abnormal results are displayed) ?Results for orders placed or performed during the hospital encounter of 02/28/22  ?Resp Panel by RT-PCR (Flu A&B, Covid) Nasopharyngeal Swab  ? Specimen: Nasopharyngeal Swab; Nasopharyngeal(NP) swabs in vial transport medium  ?Result Value Ref Range  ? SARS Coronavirus 2 by RT PCR NEGATIVE NEGATIVE  ? Influenza A by PCR NEGATIVE NEGATIVE  ? Influenza B by PCR NEGATIVE NEGATIVE  ?Comprehensive metabolic panel  ?Result Value Ref Range  ? Sodium 136 135 - 145 mmol/L  ? Potassium 3.4 (L) 3.5 - 5.1 mmol/L  ? Chloride 103 98 - 111 mmol/L  ? CO2 24 22 - 32 mmol/L  ? Glucose, Bld 128 (H) 70 - 99 mg/dL  ? BUN 5 (L) 6 - 20 mg/dL  ? Creatinine, Ser 0.56 (L) 0.61 - 1.24 mg/dL  ? Calcium 9.1 8.9 - 10.3 mg/dL  ? Total Protein 7.0 6.5 - 8.1 g/dL  ? Albumin 3.6 3.5 - 5.0 g/dL  ? AST 50 (H) 15 - 41 U/L  ? ALT 50 (H) 0 - 44 U/L  ? Alkaline Phosphatase 158 (H) 38 - 126 U/L  ? Total Bilirubin 0.3 0.3 - 1.2 mg/dL  ? GFR, Estimated >60 >60 mL/min  ? Anion gap 9 5 - 15  ?Ethanol  ?Result Value Ref Range  ? Alcohol, Ethyl (B) <10 <10 mg/dL  ?CBC with Diff  ?Result Value Ref Range  ? WBC 8.1 4.0 - 10.5 K/uL  ? RBC 5.07 4.22 - 5.81 MIL/uL  ? Hemoglobin 13.1 13.0 - 17.0 g/dL  ? HCT 42.3 39.0 -  52.0 %  ? MCV 83.4 80.0 - 100.0 fL  ? MCH 25.8 (L) 26.0 - 34.0 pg  ? MCHC 31.0 30.0 - 36.0 g/dL  ? RDW 14.6 11.5 - 15.5 %  ? Platelets 412 (H) 150 - 400 K/uL  ? nRBC 0.0 0.0 - 0.2 %  ? Neutrophils Relative % 50 %  ?  Neutro Abs 4.0 1.7 - 7.7 K/uL  ? Lymphocytes Relative 33 %  ? Lymphs Abs 2.7 0.7 - 4.0 K/uL  ? Monocytes Relative 13 %  ? Monocytes Absolute 1.0 0.1 - 1.0 K/uL  ? Eosinophils Relative 3 %  ? Eosinophils Absolute 0.3 0.0 - 0.5 K/uL  ? Basophils Relative 1 %  ? Basophils Absolute 0.1 0.0 - 0.1 K/uL  ? Immature Granulocytes 0 %  ? Abs Immature Granulocytes 0.01 0.00 - 0.07 K/uL  ?Acetaminophen level  ?Result Value Ref Range  ? Acetaminophen (Tylenol), Serum 33 (H) 10 - 30 ug/mL  ?Salicylate level  ?Result Value Ref Range  ? Salicylate Lvl <7.0 (L) 7.0 - 30.0 mg/dL  ? ? ? ? ?EKG ?None ? ?Radiology ?D ? ?Procedures ?Procedures  ? ? ?Medications Ordered in ED ?Medications  ?LORazepam (ATIVAN) tablet 1 mg (has no administration in time range)  ? ? ?ED Course/ Medical Decision Making/ A&P ?  ?                        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 ?Amount and/or Complexity of Data Reviewed ?External Data Reviewed: notes. ?Labs: ordered. Decision-making details documented in ED Course. ?ECG/medicine tests: ordered and independent interpretation performed. Decision-making details documented in ED Course. ? ?Risk ?Prescription drug management. ? ?Iv ns. Continuous pulse ox and cardiac  monitoring. Labs ordered/sent. Imaging ordered.  ? ?Reviewed nursing notes and prior charts for additional history. External reports reviewed. Additional history from: ? ?Cardiac monitor: sinus rhythm, rate

## 2022-07-13 ENCOUNTER — Emergency Department (HOSPITAL_COMMUNITY)
Admission: EM | Admit: 2022-07-13 | Discharge: 2022-07-14 | Disposition: A | Discharge status: Home / Self Care | Payer: Self-pay | Attending: Emergency Medicine | Admitting: Emergency Medicine

## 2022-07-13 ENCOUNTER — Emergency Department (HOSPITAL_COMMUNITY): Payer: Self-pay

## 2022-07-13 ENCOUNTER — Encounter (HOSPITAL_COMMUNITY): Payer: Self-pay

## 2022-07-13 DIAGNOSIS — K921 Melena: Secondary | ICD-10-CM | POA: Insufficient documentation

## 2022-07-13 DIAGNOSIS — L03114 Cellulitis of left upper limb: Secondary | ICD-10-CM | POA: Insufficient documentation

## 2022-07-13 DIAGNOSIS — F1910 Other psychoactive substance abuse, uncomplicated: Secondary | ICD-10-CM | POA: Insufficient documentation

## 2022-07-13 DIAGNOSIS — Z79899 Other long term (current) drug therapy: Secondary | ICD-10-CM | POA: Insufficient documentation

## 2022-07-13 LAB — URINALYSIS, ROUTINE W REFLEX MICROSCOPIC
Bilirubin Urine: NEGATIVE
Glucose, UA: NEGATIVE mg/dL
Ketones, ur: 5 mg/dL — AB
Nitrite: NEGATIVE
Protein, ur: 100 mg/dL — AB
RBC / HPF: >50 RBC/hpf — ABNORMAL HIGH (ref 0–5)
Specific Gravity, Urine: 1.026 (ref 1.005–1.030)
pH: 5 (ref 5.0–8.0)

## 2022-07-13 LAB — CBC WITH DIFFERENTIAL/PLATELET
Abs Immature Granulocytes: 0.21 10*3/uL — ABNORMAL HIGH (ref 0.00–0.07)
Basophils Absolute: 0.1 10*3/uL (ref 0.0–0.1)
Basophils Relative: 1 %
Eosinophils Absolute: 0.2 10*3/uL (ref 0.0–0.5)
Eosinophils Relative: 1 %
HCT: 46.3 % (ref 39.0–52.0)
Hemoglobin: 15.5 g/dL (ref 13.0–17.0)
Immature Granulocytes: 1 %
Lymphocytes Relative: 24 %
Lymphs Abs: 3.8 10*3/uL (ref 0.7–4.0)
MCH: 28.3 pg (ref 26.0–34.0)
MCHC: 33.5 g/dL (ref 30.0–36.0)
MCV: 84.6 fL (ref 80.0–100.0)
Monocytes Absolute: 1.3 10*3/uL — ABNORMAL HIGH (ref 0.1–1.0)
Monocytes Relative: 8 %
Neutro Abs: 10.4 10*3/uL — ABNORMAL HIGH (ref 1.7–7.7)
Neutrophils Relative %: 65 %
Platelets: 390 10*3/uL (ref 150–400)
RBC: 5.47 MIL/uL (ref 4.22–5.81)
RDW: 15 % (ref 11.5–15.5)
WBC: 16 10*3/uL — ABNORMAL HIGH (ref 4.0–10.5)
nRBC: 0 % (ref 0.0–0.2)

## 2022-07-13 LAB — COMPREHENSIVE METABOLIC PANEL
ALT: 44 U/L (ref 0–44)
AST: 40 U/L (ref 15–41)
Albumin: 4.8 g/dL (ref 3.5–5.0)
Alkaline Phosphatase: 128 U/L — ABNORMAL HIGH (ref 38–126)
Anion gap: 11 (ref 5–15)
BUN: 19 mg/dL (ref 6–20)
CO2: 21 mmol/L — ABNORMAL LOW (ref 22–32)
Calcium: 9.6 mg/dL (ref 8.9–10.3)
Chloride: 107 mmol/L (ref 98–111)
Creatinine, Ser: 0.85 mg/dL (ref 0.61–1.24)
GFR, Estimated: >60 mL/min (ref >60)
Glucose, Bld: 96 mg/dL (ref 70–99)
Potassium: 3.3 mmol/L — ABNORMAL LOW (ref 3.5–5.1)
Sodium: 139 mmol/L (ref 135–145)
Total Bilirubin: 1.5 mg/dL — ABNORMAL HIGH (ref 0.3–1.2)
Total Protein: 8.2 g/dL — ABNORMAL HIGH (ref 6.5–8.1)

## 2022-07-13 LAB — RAPID URINE DRUG SCREEN, HOSP PERFORMED
Amphetamines: POSITIVE — AB
Barbiturates: NOT DETECTED
Benzodiazepines: NOT DETECTED
Cocaine: POSITIVE — AB
Opiates: NOT DETECTED
Tetrahydrocannabinol: POSITIVE — AB

## 2022-07-13 LAB — LACTIC ACID, PLASMA: Lactic Acid, Venous: 1.1 mmol/L (ref 0.5–1.9)

## 2022-07-13 MED ORDER — LORAZEPAM 2 MG/ML IJ SOLN: 1.0000 mg | Freq: Once | INTRAMUSCULAR | Status: DC

## 2022-07-13 MED ORDER — LORAZEPAM 2 MG/ML IJ SOLN
2.0000 mg | Freq: Once | INTRAMUSCULAR | Status: AC
  Administered 2022-07-13: 2 mg via INTRAMUSCULAR
  Filled 2022-07-13: qty 1

## 2022-07-13 MED ORDER — CEPHALEXIN 500 MG PO CAPS
500.0000 mg | ORAL_CAPSULE | Freq: Once | ORAL | Status: AC
  Administered 2022-07-13: 500 mg via ORAL
  Filled 2022-07-13: qty 1

## 2022-07-13 MED ORDER — DOXYCYCLINE HYCLATE 100 MG PO TABS
100.0000 mg | ORAL_TABLET | Freq: Once | ORAL | Status: AC
  Administered 2022-07-13: 100 mg via ORAL
  Filled 2022-07-13: qty 1

## 2022-07-13 MED ORDER — CEPHALEXIN 500 MG PO CAPS: 500.0000 mg | ORAL_CAPSULE | Freq: Four times a day (QID) | ORAL | 0 refills | Status: DC

## 2022-07-13 NOTE — ED Notes (Signed)
Needs IV @2358 
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
## 2022-07-13 NOTE — ED Notes (Signed)
Somewhat not as "ancey" since Ativan.
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
## 2022-07-13 NOTE — ED Provider Notes (Signed)
[facility]-EMERGENCY DEPT Provider Note   CSN: [id number] Arrival date & time: 07/13/22  1530     History  Chief Complaint  Patient presents with   Hand Pain   Hematochezia    Jason Buchanan is a 36 y.o. male.  Patient is a 36 year old male who presents with possible hand infection.  On chart review, he had a complex cellulitis/abscess of both of his arms.  He was admitted in December for this.  He does have a history of substance abuse and was injecting into his arms.  He had deep abscesses that required multiple debridements and skin grafting.  He had a prolonged hospitalization.  There was a part of his left he noted that was thought to have a small retained foreign body.  He says he can still feel the knot there and feels like it may be infected.  He denies any fevers.  No urinary symptoms.  He has a little bit of a cough.  He also says that he put a stack of drugs in his rectum yesterday.  He says he pulled the whole thing out and he does not feel like there is anything retained.  He had some bleeding yesterday but has not had any since that time.  He he denies any abdominal pain.  No nausea or vomiting.  He is quite anxious.  He denies suicidal ideations.       Home Medications Prior to Admission medications   Medication Sig Start Date End Date Taking? Authorizing Provider  amitriptyline (ELAVIL) 25 MG tablet Take 1 tablet (25 mg total) by mouth at bedtime. 01/17/22 02/27/22  Jerald Kief, MD  ferrous sulfate 325 (65 FE) MG tablet Take 1 tablet (325 mg total) by mouth daily with breakfast. 01/18/22 04/08/22  Jerald Kief, MD  gabapentin (NEURONTIN) 400 MG capsule Take 1 capsule (400 mg total) by mouth 3 (three) times daily. Patient taking differently: Take 400 mg by mouth 3 (three) times daily as needed (pain). 01/17/22 04/08/22  Jerald Kief, MD  ibuprofen (ADVIL) 200 MG tablet Take 800 mg by mouth daily as needed for mild pain or moderate pain.     [provider]  methocarbamol (ROBAXIN) 500 MG tablet Take 1 tablet (500 mg total) by mouth every 8 (eight) hours as needed for muscle spasms. Patient not taking: Reported on 02/27/2022 01/17/22   Jerald Kief, MD  mupirocin ointment (BACTROBAN) 2 % Apply 1 application. topically 2 (two) times daily. 02/01/22   [provider]  oxycodone (OXY-IR) 5 MG capsule Take 1 capsule (5 mg total) by mouth every 4 (four) hours as needed. Patient not taking: Reported on 02/27/2022 01/17/22   Jerald Kief, MD  pantoprazole (PROTONIX) 40 MG tablet Take 1 tablet (40 mg total) by mouth daily. 01/18/22 04/08/22  Jerald Kief, MD  QUEtiapine (SEROQUEL XR) 300 MG 24 hr tablet Take 1 tablet (300 mg total) by mouth at bedtime. Patient taking differently: Take 150 mg by mouth at bedtime. 01/17/22 04/08/22  Jerald Kief, MD      Allergies    Bee venom, Succinylcholine, Chlorhexidine, and Naproxen    Review of Systems   Review of Systems  Constitutional:  Negative for chills, diaphoresis, fatigue and fever.  HENT:  Negative for congestion, rhinorrhea and sneezing.   Eyes: Negative.   Respiratory:  Negative for cough, chest tightness and shortness of breath.   Cardiovascular:  Negative for chest pain and leg swelling.  Gastrointestinal:  Negative for abdominal pain, blood in stool, diarrhea, nausea and vomiting.  Genitourinary:  Negative for difficulty urinating, flank pain, frequency and hematuria.  Musculoskeletal:  Negative for arthralgias and back pain.  Skin:  Positive for color change and wound. Negative for rash.  Neurological:  Negative for dizziness, speech difficulty, weakness, numbness and headaches.  Psychiatric/Behavioral:  The patient is nervous/anxious.     Physical Exam Updated Vital Signs BP (!) 131/94   Pulse (!) 52   Temp 98.3 F (36.8 C) (Oral)   Resp 18   SpO2 93%  Physical Exam Constitutional:      Appearance: He is well-developed.  HENT:     Head:  Normocephalic and atraumatic.  Eyes:     Pupils: Pupils are equal, round, and reactive to light.  Cardiovascular:     Rate and Rhythm: Normal rate and regular rhythm.     Heart sounds: Normal heart sounds.  Pulmonary:     Effort: Pulmonary effort is normal. No respiratory distress.     Breath sounds: Normal breath sounds. No wheezing or rales.  Chest:     Chest wall: No tenderness.  Abdominal:     General: Bowel sounds are normal.     Palpations: Abdomen is soft.     Tenderness: There is no abdominal tenderness. There is no guarding or rebound.  Musculoskeletal:        General: Normal range of motion.     Cervical back: Normal range of motion and neck supple.     Comments: Scars on both of his hands and forearm from prior surgeries.  He has some very mild warmth to the dorsal surface of his left hand.  It is erythematous but that seems to be a chronic discoloration for him and is similar to his right hand.  He has a small area of erythema with a tiny pinpoint pimple in the center of the wound.  No induration or fluctuance.  Lymphadenopathy:     Cervical: No cervical adenopathy.  Skin:    General: Skin is warm and dry.     Findings: No rash.  Neurological:     Mental Status: He is alert and oriented to person, place, and time.        ED Results / Procedures / Treatments   Labs (all labs ordered are listed, but only abnormal results are displayed) Labs Reviewed  CBC WITH DIFFERENTIAL/PLATELET - Abnormal; Notable for the following components:      Result Value   WBC 16.0 (*)    Neutro Abs 10.4 (*)    Monocytes Absolute 1.3 (*)    Abs Immature Granulocytes 0.21 (*)    All other components within normal limits  COMPREHENSIVE METABOLIC PANEL - Abnormal; Notable for the following components:   Potassium 3.3 (*)    CO2 21 (*)    Total Protein 8.2 (*)    Alkaline Phosphatase 128 (*)    Total Bilirubin 1.5 (*)    All other components within normal limits  URINALYSIS, ROUTINE W  REFLEX MICROSCOPIC - Abnormal; Notable for the following components:   Color, Urine AMBER (*)    APPearance CLOUDY (*)    Hgb urine dipstick MODERATE (*)    Ketones, ur 5 (*)    Protein, ur 100 (*)    Leukocytes,Ua SMALL (*)    RBC / HPF >50 (*)    Bacteria, UA MANY (*)    All other components within normal limits  RAPID URINE DRUG SCREEN, HOSP  PERFORMED - Abnormal; Notable for the following components:   Cocaine POSITIVE (*)    Amphetamines POSITIVE (*)    Tetrahydrocannabinol POSITIVE (*)    All other components within normal limits  LACTIC ACID, PLASMA  LACTIC ACID, PLASMA    EKG None  Radiology DG Chest 2 View  Result Date: 07/13/2022 CLINICAL DATA:  Cough EXAM: CHEST - 2 VIEW COMPARISON:  None Available. FINDINGS: Lungs are well expanded, symmetric, and clear. No pneumothorax or pleural effusion. Cardiac size within normal limits. Pulmonary vascularity is normal. Stable ossific excrescence arising from the right sixth rib laterally possibly representing a small osteochondroma or healed fracture deformity. The osseous structures are age-appropriate. No acute bone abnormality. IMPRESSION: No active cardiopulmonary disease. Electronically Signed   By: Helyn Numbers M.D.   On: 07/13/2022 19:28   DG Wrist Complete Left  Result Date: 07/13/2022 CLINICAL DATA:  left wrist lesion; per the history note, patient was very anxious during triage, movements were rapid and erratic. Patient stated that he was having left hand pain near very had surgery earlier this year and thinks it might be infected. EXAM: LEFT WRIST - COMPLETE 4 VIEW COMPARISON:  November 14, 2021 FINDINGS: There is some deformity seen secondary to old healed fracture of the distal fifth metacarpal. There are moderate arthritic changes seen with some loss of the radiocarpal, intercarpal and carpometacarpal joints. There are subchondral lucencies seen at the articular surface of the radius. There are some lucencies seen at the  carpal bones as well. Diffuse sclerosis of the carpal bones. IMPRESSION: 1. There is some deformity secondary to old healed fracture of the distal aspect of the fifth metacarpal. 2. There are moderate arthritic changes seen at the radiocarpal, intercarpal and to a lesser extent carpometacarpal joints and are new since the previous study. Electronically Signed   By: Marjo Bicker M.D.   On: 07/13/2022 17:07    Procedures Procedures    Medications Ordered in ED Medications  LORazepam (ATIVAN) injection 2 mg (has no administration in time range)  doxycycline (VIBRA-TABS) tablet 100 mg (100 mg Oral Given 07/13/22 2040)  LORazepam (ATIVAN) injection 2 mg (2 mg Intramuscular Given 07/13/22 2119)  cephALEXin (KEFLEX) capsule 500 mg (500 mg Oral Given 07/13/22 2238)    ED Course/ Medical Decision Making/ A&P                           Medical Decision Making Amount and/or Complexity of Data Reviewed Labs: ordered. Radiology: ordered.  Risk Prescription drug management.   Patient is a 36 year old male who presents with possible infection of his left hand.  He says it is more sore than it normally is.  There is a small early abscess looking area which he feels may have a retained foreign body.  X-rays were performed which showed no visible foreign body.  No evidence of osteomyelitis.  His hand looks very similar to the right hand and that it has some mild erythema but the color appears baseline.  However I will go ahead and start him on antibiotics.  On chart review, he was positive for Streptococcus pyogenes.  Will start Keflex.  His urinalysis showed some suggestions of infection as well which likely should be covered by Keflex.  He appears to be under the influence of some substances.  He is very anxious and jittery.  He was given a dose of Ativan and will need to be monitored until he metabolizes.  He did  state that he had some drugs in his rectum yesterday.  He says that he got the entire bag out.   He does not have any abdominal pain or recurrent rectal pain.  No bleeding.  He does not want me to do a rectal exam or further evaluate that area.  23:21 patient is much more agitated than when he arrived in the ED.  He remains tachycardic in the 110s.  I asked him again about rectal foreign bodies and he says that he is sure he got the whole bag out.  I did do a rectal exam and I do not palpate any foreign bodies.  Will check a KUB.  He was given Ativan.  Patient care turned over to Dr. Madilyn Hook pending ongoing monitoring.    Final Clinical Impression(s) / ED Diagnoses Final diagnoses:  Cellulitis of left upper extremity  Polysubstance abuse [facility])    Rx / DC Orders ED Discharge Orders     None         Rolan Bucco, MD 07/13/22 2322

## 2022-07-13 NOTE — ED Provider Notes (Signed)
Pt care assumed pending re-evaluation due to intoxication.  Patient is intoxicated on evaluation, pacing the room.  He is not currently suicidal, homicidal.  He refuses to stay for further evaluation.  He states he wants to go home and stay with his roommate.  Discussed with patient recommendation to stay in the emergency department until he metabolizes his methamphetamine and cocaine and patient declines.   Tilden Fossa, MD 07/14/22 [phone]

## 2022-07-13 NOTE — ED Notes (Signed)
Patient seems a little "ancey", but remains cooperative.

## 2022-07-14 ENCOUNTER — Emergency Department (HOSPITAL_COMMUNITY): Payer: Self-pay

## 2022-07-14 ENCOUNTER — Encounter (HOSPITAL_COMMUNITY): Payer: Self-pay

## 2022-07-14 MED ORDER — LORAZEPAM 2 MG/ML IJ SOLN
1.0000 mg | Freq: Once | INTRAMUSCULAR | Status: AC
  Administered 2022-07-14: 1 mg via INTRAVENOUS
  Filled 2022-07-14: qty 1

## 2022-07-14 MED ORDER — SODIUM CHLORIDE (PF) 0.9 % IJ SOLN
INTRAMUSCULAR | Status: AC
  Filled 2022-07-14: qty 50

## 2022-07-14 MED ORDER — IOHEXOL 300 MG/ML  SOLN
100.0000 mL | Freq: Once | INTRAMUSCULAR | Status: AC | PRN
  Administered 2022-07-14: 100 mL via INTRAVENOUS

## 2022-07-14 NOTE — ED Notes (Signed)
The patient reports I can call his brother Greggory Stallion whose number is listed in his chart.  This RN has called Greggory Stallion but it went straight to voicemail.

## 2022-07-14 NOTE — ED Notes (Addendum)
Pt continues to appear very restless and jumpy. He will not lay still on his stretcher, continues to lay on the floor. He has rummaged through the cabinets and drawers and threw stuff out. The doors and cabinets were then locked. He also picked up the trash can and emptied its contents onto the floor. He began ripping apart a soiled tissue cardboard box. The trash can was then removed from his room. He is in the room laughing and talking to himself. He states he does not have his cell phone to be able to call his friend to come pick him up not does he know the phone number. Dr. Madilyn Hook aware. This RN does not feel comfortable discharging this patient while by himself at this time.

## 2022-07-14 NOTE — ED Notes (Addendum)
After consulting with the charge nurse, I took the bed out of the room due tot he patient being restless and continually sticking his head through the railing of the bed. I left the mattress on the floor with a pillow and the patient has a chair in the room.

## 2022-07-14 NOTE — ED Notes (Signed)
Pt has a lighter and a vape at the CN desk in the drawer bag is labeled. Please give back to pt when discharged.

## 2022-07-14 NOTE — ED Notes (Signed)
Pt is not keeping blood pressure cuff and pulse ox on.

## 2022-07-14 NOTE — ED Notes (Signed)
Dr. Madilyn Hook has come to re-evaluate the patient.

## 2022-07-14 NOTE — ED Notes (Signed)
Patient alert and oriented x4 before discharge. Patient ambulatory without using assistive devices.

## 2022-07-15 ENCOUNTER — Emergency Department (HOSPITAL_COMMUNITY)
Admission: EM | Admit: 2022-07-15 | Discharge: 2022-07-15 | Disposition: A | Discharge status: Home / Self Care | Payer: Self-pay | Attending: Emergency Medicine | Admitting: Emergency Medicine

## 2022-07-15 ENCOUNTER — Other Ambulatory Visit: Payer: Self-pay

## 2022-07-15 ENCOUNTER — Emergency Department (HOSPITAL_COMMUNITY): Payer: Self-pay

## 2022-07-15 ENCOUNTER — Encounter (HOSPITAL_COMMUNITY): Payer: Self-pay

## 2022-07-15 DIAGNOSIS — M25532 Pain in left wrist: Secondary | ICD-10-CM | POA: Insufficient documentation

## 2022-07-15 MED ORDER — IBUPROFEN 200 MG PO TABS
400.0000 mg | ORAL_TABLET | Freq: Once | ORAL | Status: AC
  Administered 2022-07-15: 400 mg via ORAL
  Filled 2022-07-15: qty 2

## 2022-07-15 MED ORDER — ACETAMINOPHEN 500 MG PO TABS
1000.0000 mg | ORAL_TABLET | Freq: Once | ORAL | Status: AC
Start: 2022-07-15 — End: 2022-07-15
  Administered 2022-07-15: 1000 mg via ORAL
  Filled 2022-07-15: qty 2

## 2022-07-15 NOTE — ED Triage Notes (Signed)
Pt arrive with c/o left wrist pain. Pt able to move wrist in triage.

## 2022-07-15 NOTE — Discharge Instructions (Signed)
Please take Tylenol Motrin for pain.  The x-rays were negative for fracture.  Follow-up with PCP.

## 2022-07-15 NOTE — ED Provider Notes (Signed)
[facility]-EMERGENCY DEPT Provider Note   CSN: [id number] Arrival date & time: 07/15/22  1723     History  Chief Complaint  Patient presents with   Wrist Pain    Jason Buchanan is a 36 y.o. male.   Wrist Pain   Patient presents with left left upper extremity pain.  Patient has history of polysubstance use disorder, he adamantly denies any IV drug use with me.  Patient states he fell on his left upper extremity a few days ago and has been having pain since then his elbow and his wrist.  He is not having any fevers or chills, denies any drainage.  Denies any medicine prior to arrival.    Home Medications Prior to Admission medications   Medication Sig Start Date End Date Taking? Authorizing Provider  amitriptyline (ELAVIL) 25 MG tablet Take 1 tablet (25 mg total) by mouth at bedtime. Patient not taking: Reported on 07/13/2022 01/17/22 02/27/22  Jerald Kief, MD  cephALEXin (KEFLEX) 500 MG capsule Take 1 capsule (500 mg total) by mouth 4 (four) times daily. 07/13/22   Rolan Bucco, MD  ferrous sulfate 325 (65 FE) MG tablet Take 1 tablet (325 mg total) by mouth daily with breakfast. Patient not taking: Reported on 07/13/2022 01/18/22 04/08/22  Jerald Kief, MD  gabapentin (NEURONTIN) 400 MG capsule Take 1 capsule (400 mg total) by mouth 3 (three) times daily. Patient not taking: Reported on 07/13/2022 01/17/22 04/08/22  Jerald Kief, MD  methocarbamol (ROBAXIN) 500 MG tablet Take 1 tablet (500 mg total) by mouth every 8 (eight) hours as needed for muscle spasms. Patient not taking: Reported on 02/27/2022 01/17/22   Jerald Kief, MD  oxycodone (OXY-IR) 5 MG capsule Take 1 capsule (5 mg total) by mouth every 4 (four) hours as needed. Patient not taking: Reported on 02/27/2022 01/17/22   Jerald Kief, MD  pantoprazole (PROTONIX) 40 MG tablet Take 1 tablet (40 mg total) by mouth daily. Patient not taking: Reported on 07/13/2022 01/18/22 04/08/22  Jerald Kief, MD   QUEtiapine (SEROQUEL XR) 300 MG 24 hr tablet Take 1 tablet (300 mg total) by mouth at bedtime. Patient not taking: Reported on 07/13/2022 01/17/22 04/08/22  Jerald Kief, MD      Allergies    Bee venom, Succinylcholine, Chlorhexidine, and Naproxen    Review of Systems   Review of Systems  Physical Exam Updated Vital Signs BP 131/89 (BP Location: Left Arm)   Pulse (!) 112   Temp 98.2 F (36.8 C) (Oral)   Ht 5\' 7"  (1.702 m)   Wt 65.8 kg   SpO2 100%   BMI 22.71 kg/m  Physical Exam Vitals and nursing note reviewed. Exam conducted with a chaperone present.  Constitutional:      General: He is not in acute distress.    Appearance: Normal appearance.     Comments: Patient is anxious and agitated in the room.    HENT:     Head: Normocephalic and atraumatic.  Eyes:     General: No scleral icterus.    Extraocular Movements: Extraocular movements intact.     Pupils: Pupils are equal, round, and reactive to light.  Cardiovascular:     Pulses: Normal pulses.  Musculoskeletal:     Comments: Tolerates passive ROM to elbow and wrist.  No crepitus, distractible pain.  Skin:    Capillary Refill: Capillary refill takes less than 2 seconds.     Coloration: Skin is not jaundiced.  Findings: Erythema present.     Comments: Patient is excessive scratching on his arms on exam, there is no warmth or surrounding erythema does not appear to be consistent with cellulitis.  He does have some sores that appear previously infected, there is some erythema and swelling to the olecranon.  See photos.  Neurological:     Mental Status: He is alert. Mental status is at baseline.     Coordination: Coordination normal.        ED Results / Procedures / Treatments   Labs (all labs ordered are listed, but only abnormal results are displayed) Labs Reviewed - No data to display  EKG None  Radiology DG Elbow Complete Left  Result Date: 07/15/2022 CLINICAL DATA:  Fall today.  Elbow pain and  swelling. EXAM: LEFT ELBOW - COMPLETE 3+ VIEW COMPARISON:  None Available. FINDINGS: There is no evidence of acute fracture, dislocation, or joint effusion. Internal fixation hardware is seen in the proximal ulna. There is no evidence of arthropathy or other focal bone abnormality. Soft tissues are unremarkable. IMPRESSION: No acute findings. Electronically Signed   By: Danae Orleans M.D.   On: 07/15/2022 18:58   DG Wrist Complete Left  Result Date: 07/15/2022 CLINICAL DATA:  Left wrist pain. EXAM: LEFT WRIST - COMPLETE 3+ VIEW COMPARISON:  Chest radiograph July 13 2022 FINDINGS: There is no evidence of acute fracture or dislocation. Degenerative change of the radiocarpal and intercarpal joints. Degenerative change at the distal radioulnar joint. Chronic irregularity of the distal fifth metacarpal. Soft tissues are unremarkable. IMPRESSION: Chronic osseous changes appears similar prior without acute osseous abnormality. Electronically Signed   By: Maudry Mayhew M.D.   On: 07/15/2022 18:16   CT Abdomen Pelvis W Contrast  Result Date: 07/14/2022 CLINICAL DATA:  Peritonitis or perforation suspected pt with body packing meth/cocaine, rectal pain EXAM: CT ABDOMEN AND PELVIS WITH CONTRAST TECHNIQUE: Multidetector CT imaging of the abdomen and pelvis was performed using the standard protocol following bolus administration of intravenous contrast. RADIATION DOSE REDUCTION: This exam was performed according to the departmental dose-optimization program which includes automated exposure control, adjustment of the mA and/or kV according to patient size and/or use of iterative reconstruction technique. CONTRAST:  OMNIPAQUE IOHEXOL 300 MG/ML  SOLN COMPARISON:  02/27/2022 FINDINGS: Patient motion limits study despite repeating the study. Lower chest: No acute abnormality Hepatobiliary: No visible focal hepatic abnormality. Gallbladder grossly unremarkable. Pancreas: No focal abnormality or ductal dilatation.  Spleen: No focal abnormality.  Normal size. Adrenals/Urinary Tract: No hydronephrosis. No visible renal or adrenal mass. Urinary bladder unremarkable. Stomach/Bowel: Moderate stool burden in the colon. Stomach, large and small bowel grossly unremarkable. Normal appendix. No visible foreign body in the rectum. Vascular/Lymphatic: Aortic atherosclerosis. No evidence of aneurysm or adenopathy. Reproductive: Obscured by motion artifact. Other: No free fluid or free air. Musculoskeletal: No acute bony abnormality. IMPRESSION: Limited study due to patient motion despite repeating study. No visible foreign body within the rectum. Moderate stool burden in the colon. Electronically Signed   By: Charlett Nose M.D.   On: 07/14/2022 01:18    Procedures Procedures    Medications Ordered in ED Medications  acetaminophen (TYLENOL) tablet 1,000 mg (1,000 mg Oral Given 07/15/22 2005)  ibuprofen (ADVIL) tablet 400 mg (400 mg Oral Given 07/15/22 2005)    ED Course/ Medical Decision Making/ A&P Clinical Course as of 07/15/22 2339  Sat Jul 15, 2022  1921 Stable 36 YOM with left hand pain. Fall on it. Frequent visits for similar.  Left elbow pain.  Reevaluated at bedside.  Patient denies fevers chills nausea vomiting denies any continued IV drug use.  He denies any history of septic arthritis.  He states that he has run out of all of his psych medications and has been lost to psychiatric follow-up.  [CC]    Clinical Course User Index [CC] Glyn Ade, MD                           Medical Decision Making Amount and/or Complexity of Data Reviewed Radiology: ordered.  Risk OTC drugs.   Patient presents due to wrist and elbow pain.  Differential includes not limited to septic bursitis, laboratory bursitis, fractures, dislocations, cellulitis, septic joint.  On exam patient is neurovascular intact with brisk cap refill.  Tolerates ROM to wrist and elbow.  No point tenderness, no crepitus.  Patient is  requesting food multiple times, and having any fevers or chills.  I did order plain films given recent traumatic history reported by the patient.  Negative for any traumatic findings.  Agree with radiologist dictation.    Given patient tolerates passive ROM I do not think this is a septic joint.  There is also no radiopaque foreign bodies in the lungs concerning for retained needles.  Do not think any additional work-up is indicated emergently, I did discuss abstaining from drug use with the patient.    Patient states she feels improved after sandwich.  Elbow is most likely a bursitis, do not think it is septic or infectious.  Do not think antibiotics indicated.  Pain medicine given, sling provided for comfort.  Patient discharged in stable condition.        Final Clinical Impression(s) / ED Diagnoses Final diagnoses:  Left wrist pain    Rx / DC Orders ED Discharge Orders     None         Theron Arista, Cordelia Poche 07/15/22 2339    Derwood Kaplan, MD 07/16/22 1328

## 2022-07-16 ENCOUNTER — Telehealth (HOSPITAL_COMMUNITY): Payer: Self-pay | Admitting: Emergency Medicine

## 2022-07-16 MED ORDER — DOXYCYCLINE HYCLATE 100 MG PO CAPS: 100.0000 mg | ORAL_CAPSULE | Freq: Two times a day (BID) | ORAL | 0 refills | Status: DC

## 2022-07-16 NOTE — ED Notes (Addendum)
Opened chart to provide pharmacy to pick up meds with pt called and stated he did not have his discharge paperwork nor could he remember how he got home. Updated pharmacy in chart.Dr Charm Barges has sent prescription to [facility] on [address]. Pt notified.

## 2022-07-16 NOTE — Telephone Encounter (Signed)
Patient ? Lost script from ed visit. Looks like he received keflex and doxy, don't see a script provided. Will order doxy.

## 2022-07-17 NOTE — Progress Notes (Signed)
CM received notification that patient called with request for assistance with medication costs, CM called back on the phone number patient provided, an unknown individual answered and reported Mr Imburgia was not available.  CM attempted to call numbers in chart, unable to reach anyone at this time.  TOC is unable to assist with medication costs at this time, TOC is unable to contact patient to gather needed information.

## 2022-07-28 ENCOUNTER — Other Ambulatory Visit: Payer: Self-pay

## 2022-07-28 ENCOUNTER — Emergency Department (HOSPITAL_COMMUNITY)
Admission: EM | Admit: 2022-07-28 | Discharge: 2022-07-28 | Disposition: A | Discharge status: Home / Self Care | Attending: Emergency Medicine | Admitting: Emergency Medicine

## 2022-07-28 DIAGNOSIS — M79642 Pain in left hand: Secondary | ICD-10-CM | POA: Insufficient documentation

## 2022-07-28 DIAGNOSIS — M25522 Pain in left elbow: Secondary | ICD-10-CM | POA: Diagnosis not present

## 2022-07-28 DIAGNOSIS — Z79899 Other long term (current) drug therapy: Secondary | ICD-10-CM | POA: Diagnosis not present

## 2022-07-28 DIAGNOSIS — L03114 Cellulitis of left upper limb: Secondary | ICD-10-CM

## 2022-07-28 LAB — CBC WITH DIFFERENTIAL/PLATELET
Abs Immature Granulocytes: 0.02 10*3/uL (ref 0.00–0.07)
Basophils Absolute: 0.1 10*3/uL (ref 0.0–0.1)
Basophils Relative: 1 %
Eosinophils Absolute: 0.3 10*3/uL (ref 0.0–0.5)
Eosinophils Relative: 3 %
HCT: 40.6 % (ref 39.0–52.0)
Hemoglobin: 13.1 g/dL (ref 13.0–17.0)
Immature Granulocytes: 0 %
Lymphocytes Relative: 34 %
Lymphs Abs: 3 10*3/uL (ref 0.7–4.0)
MCH: 28.5 pg (ref 26.0–34.0)
MCHC: 32.3 g/dL (ref 30.0–36.0)
MCV: 88.5 fL (ref 80.0–100.0)
Monocytes Absolute: 0.9 10*3/uL (ref 0.1–1.0)
Monocytes Relative: 10 %
Neutro Abs: 4.5 10*3/uL (ref 1.7–7.7)
Neutrophils Relative %: 52 %
Platelets: 366 10*3/uL (ref 150–400)
RBC: 4.59 MIL/uL (ref 4.22–5.81)
RDW: 15.7 % — ABNORMAL HIGH (ref 11.5–15.5)
WBC: 8.7 10*3/uL (ref 4.0–10.5)
nRBC: 0 % (ref 0.0–0.2)

## 2022-07-28 LAB — BASIC METABOLIC PANEL
Anion gap: 8 (ref 5–15)
BUN: 13 mg/dL (ref 6–20)
CO2: 20 mmol/L — ABNORMAL LOW (ref 22–32)
Calcium: 9 mg/dL (ref 8.9–10.3)
Chloride: 111 mmol/L (ref 98–111)
Creatinine, Ser: 0.45 mg/dL — ABNORMAL LOW (ref 0.61–1.24)
GFR, Estimated: >60 mL/min (ref >60)
Glucose, Bld: 107 mg/dL — ABNORMAL HIGH (ref 70–99)
Potassium: 3.7 mmol/L (ref 3.5–5.1)
Sodium: 139 mmol/L (ref 135–145)

## 2022-07-28 LAB — LACTIC ACID, PLASMA: Lactic Acid, Venous: 1.1 mmol/L (ref 0.5–1.9)

## 2022-07-28 MED ORDER — DOXYCYCLINE HYCLATE 100 MG PO CAPS: 100.0000 mg | ORAL_CAPSULE | Freq: Two times a day (BID) | ORAL | 0 refills | Status: DC

## 2022-07-28 MED ORDER — CELECOXIB 200 MG PO CAPS: 200.0000 mg | ORAL_CAPSULE | Freq: Two times a day (BID) | ORAL | 0 refills | Status: DC

## 2022-07-28 MED ORDER — OXYCODONE-ACETAMINOPHEN 5-325 MG PO TABS
1.0000 | ORAL_TABLET | Freq: Once | ORAL | Status: AC
  Administered 2022-07-28: 1 via ORAL
  Filled 2022-07-28: qty 1

## 2022-07-28 MED ORDER — VANCOMYCIN HCL IN DEXTROSE 1-5 GM/200ML-% IV SOLN
1000.0000 mg | Freq: Once | INTRAVENOUS | Status: AC
  Administered 2022-07-28: 1000 mg via INTRAVENOUS
  Filled 2022-07-28: qty 200

## 2022-07-28 NOTE — Discharge Instructions (Signed)
General instructions Drink enough fluid to keep your urine pale yellow. Do not touch or rub the infected area. Raise (elevate) the infected area above the level of your heart while you are sitting or lying down. Apply warm or cold compresses to the affected area as told by your health care provider. Keep all follow-up visits as told by your health care provider. This is important. These visits let your health care provider make sure a more serious infection is not developing. Contact a health care provider if: You have a fever. Your symptoms do not begin to improve within 1-2 days of starting treatment. Your bone or joint underneath the infected area becomes painful after the skin has healed. Your infection returns in the same area or another area. You notice a swollen bump in the infected area. You develop new symptoms. You have a general ill feeling (malaise) with muscle aches and pains. Get help right away if: Your symptoms get worse. You feel very sleepy. You develop vomiting or diarrhea that persists. You notice red streaks coming from the infected area. Your red area gets larger or turns dark in color.

## 2022-07-28 NOTE — ED Provider Notes (Signed)
[facility]-EMERGENCY DEPT Provider Note   CSN: [id number] Arrival date & time: 07/28/22  2005     History  Chief Complaint  Patient presents with   Hand Pain   Elbow Pain   HPI Jason Buchanan is a 36 y.o. male with past medical history of IV drug use and cellulitis and abscess of the left hand presenting for hand pain.  Patient stated that pain started 3 weeks ago in the left hand on the dorsal aspect.  Endorses decreased range of motion of the hand.  Endorses swelling and pain radiates down to the fingers and up to the elbow.  Has not taken any meds to alleviate his symptoms.  Denies chest pain shortness of breath.  Does endorse a subjective fever.   Hand Pain       Home Medications Prior to Admission medications   Medication Sig Start Date End Date Taking? Authorizing Provider  amitriptyline (ELAVIL) 25 MG tablet Take 1 tablet (25 mg total) by mouth at bedtime. Patient not taking: Reported on 07/13/2022 01/17/22 02/27/22  Jerald Kief, MD  cephALEXin (KEFLEX) 500 MG capsule Take 1 capsule (500 mg total) by mouth 4 (four) times daily. 07/13/22   Rolan Bucco, MD  doxycycline (VIBRAMYCIN) 100 MG capsule Take 1 capsule (100 mg total) by mouth 2 (two) times daily. 07/16/22   Terrilee Files, MD  ferrous sulfate 325 (65 FE) MG tablet Take 1 tablet (325 mg total) by mouth daily with breakfast. Patient not taking: Reported on 07/13/2022 01/18/22 04/08/22  Jerald Kief, MD  gabapentin (NEURONTIN) 400 MG capsule Take 1 capsule (400 mg total) by mouth 3 (three) times daily. Patient not taking: Reported on 07/13/2022 01/17/22 04/08/22  Jerald Kief, MD  methocarbamol (ROBAXIN) 500 MG tablet Take 1 tablet (500 mg total) by mouth every 8 (eight) hours as needed for muscle spasms. Patient not taking: Reported on 02/27/2022 01/17/22   Jerald Kief, MD  oxycodone (OXY-IR) 5 MG capsule Take 1 capsule (5 mg total) by mouth every 4 (four) hours as needed. Patient not  taking: Reported on 02/27/2022 01/17/22   Jerald Kief, MD  pantoprazole (PROTONIX) 40 MG tablet Take 1 tablet (40 mg total) by mouth daily. Patient not taking: Reported on 07/13/2022 01/18/22 04/08/22  Jerald Kief, MD  QUEtiapine (SEROQUEL XR) 300 MG 24 hr tablet Take 1 tablet (300 mg total) by mouth at bedtime. Patient not taking: Reported on 07/13/2022 01/17/22 04/08/22  Jerald Kief, MD      Allergies    Bee venom, Succinylcholine, Chlorhexidine, and Naproxen    Review of Systems   See HPI for pertinent ROS  Physical Exam Updated Vital Signs BP 124/83   Pulse 70   Temp 98 F (36.7 C) (Oral)   Resp 18   Ht 5\' 6"  (1.676 m)   Wt 56.7 kg   SpO2 99%   BMI 20.18 kg/m  Physical Exam Vitals and nursing note reviewed.  HENT:     Head: Normocephalic and atraumatic.     Mouth/Throat:     Mouth: Mucous membranes are moist.  Eyes:     General:        Right eye: No discharge.        Left eye: No discharge.     Conjunctiva/sclera: Conjunctivae normal.  Cardiovascular:     Rate and Rhythm: Normal rate and regular rhythm.     Pulses: Normal pulses.     Heart sounds: Normal  heart sounds.  Pulmonary:     Effort: Pulmonary effort is normal.     Breath sounds: Normal breath sounds.  Abdominal:     General: Abdomen is flat.     Palpations: Abdomen is soft.  Musculoskeletal:     Right hand: Normal.     Left hand: Swelling present. Decreased range of motion. Decreased strength.     Comments: Post surgical scar noted on dorsal aspect of left hand  Skin:    General: Skin is warm and dry.  Neurological:     General: No focal deficit present.  Psychiatric:        Mood and Affect: Mood normal.     ED Results / Procedures / Treatments   Labs (all labs ordered are listed, but only abnormal results are displayed) Labs Reviewed  BASIC METABOLIC PANEL - Abnormal; Notable for the following components:      Result Value   CO2 20 (*)    Glucose, Bld 107 (*)    Creatinine, Ser 0.45  (*)    All other components within normal limits  CBC WITH DIFFERENTIAL/PLATELET - Abnormal; Notable for the following components:   RDW 15.7 (*)    All other components within normal limits  CULTURE, BLOOD (ROUTINE X 2)  CULTURE, BLOOD (ROUTINE X 2)  LACTIC ACID, PLASMA  LACTIC ACID, PLASMA    EKG None  Radiology No results found.  Procedures Procedures    Medications Ordered in ED Medications  vancomycin (VANCOCIN) IVPB 1000 mg/200 mL premix (1,000 mg Intravenous New Bag/Given 07/28/22 2212)  oxyCODONE-acetaminophen (PERCOCET/ROXICET) 5-325 MG per tablet 1 tablet (1 tablet Oral Given 07/28/22 2212)    ED Course/ Medical Decision Making/ A&P                           Medical Decision Making Amount and/or Complexity of Data Reviewed Labs: ordered.  Risk Prescription drug management.   Patient presenting for left hand and elbow pain.  Differential diagnosis includes tendon synovitis, abscess, and inflammation.  Chart review revealed admission to the hospital for cellulitis and abscess in the left hand.  Patient stated that he did inject in both hands prior to his admission to the hospital.  The left hand is mildly swollen but range of motion is intact making tendon synovitis less likely.  Cannot rule out infection at this time.  Will order appropriate labs and decide on disposition after those labs have resulted.  Patient vitals are normal and he is afebrile.  I have signed out this patient to oncoming APP and discussed plan with ED attending physician.  They will continue ongoing management for his present complaint.        Final Clinical Impression(s) / ED Diagnoses Final diagnoses:  Left hand pain    Rx / DC Orders ED Discharge Orders     None         Gareth Eagle, PA-C 07/28/22 2250    Lorre Nick, MD 07/31/22 [phone]

## 2022-07-28 NOTE — ED Provider Notes (Signed)
I provided a substantive portion of the care of this patient.  I personally performed the entirety of the medical decision making for this encounter.      36 year old male presents with pain to his left hand x3 weeks.  History of IV drug use but has not used recently.  Concern for possible infection.  On exam here, patient has no evidence of infection.  He has normal sensation in the left hand.  No visible abscess noted.  Had surgery in his hand before in the past.  Suspect this is the etiology of his current symptoms.  No evidence of flexor tenosynovitis.  We will check blood work and medicate and likely discharge   Lorre Nick, MD 07/28/22 2154

## 2022-07-28 NOTE — ED Provider Notes (Signed)
  Physical Exam  BP (!) 120/96   Pulse 72   Temp 98 F (36.7 C) (Oral)   Resp 18   Ht 5\' 6"  (1.676 m)   Wt 56.7 kg   SpO2 99%   BMI 20.18 kg/m   Physical Exam  Procedures  Procedures  ED Course / MDM    Medical Decision Making Amount and/or Complexity of Data Reviewed Labs: ordered.  Risk Prescription drug management.   ***

## 2022-07-28 NOTE — ED Triage Notes (Signed)
Brought in from [facility] for evaluation of Lt hand pain, swelling, redness for 2-3 weeks is an IV drug user and had an infection prior and used again also Lt elbow pain for last week

## 2022-08-03 LAB — CULTURE, BLOOD (ROUTINE X 2)
Culture: NO GROWTH
Culture: NO GROWTH

## 2022-10-16 IMAGING — CT CT ABD-PELV W/ CM
2 of 5 series · 16 of 46 positions shown, 18 images · IV contrast (agent unspecified)
Comparison: Abdominal radiographs 02/27/2022. CT abdomen and pelvis
06/04/2013.

CLINICAL DATA: Rectal foreign body.

EXAM:
CT ABDOMEN AND PELVIS WITH CONTRAST
TECHNIQUE: Multidetector CT imaging of the abdomen and pelvis was performed
using the standard protocol following bolus administration of
intravenous contrast.

[Series 7: a/p w/ cor · coronal · 0.74mm/px · 3 of 110 slices shown]
[im 37/110  soft-tissue]
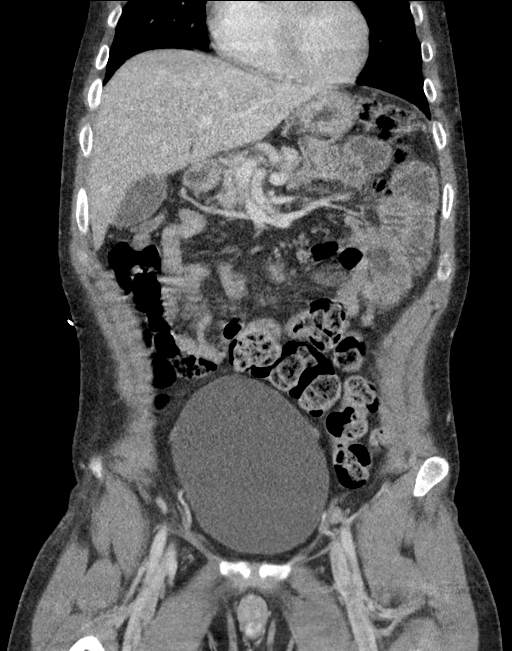
[im 49/110  soft-tissue]
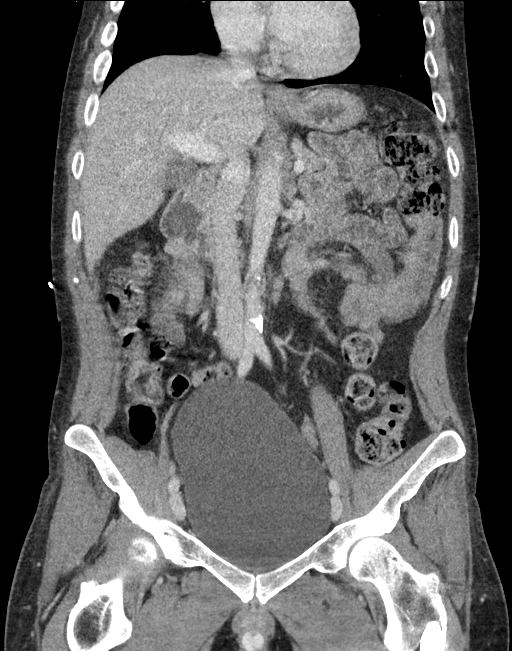
[im 61/110  soft-tissue]
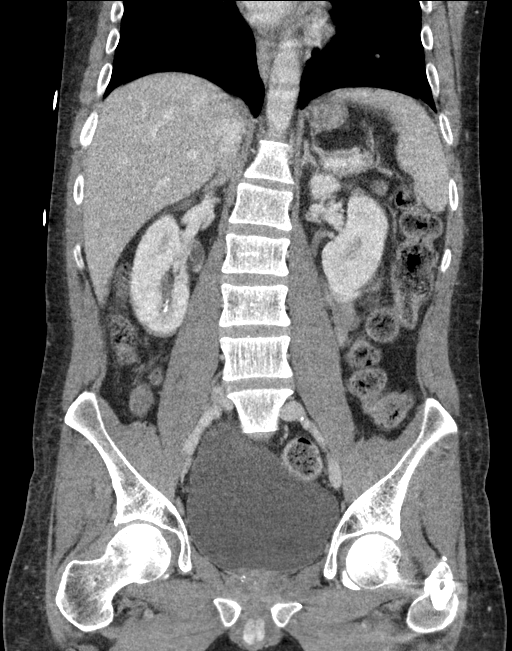

[Series 9: a/p w/ 5mm (person_name) (person_name) · axial · 0.71mm/px · z∈[+1292,+1707]mm · 13 of 97 slices shown, 15 images]
[im 7/97  soft-tissue]
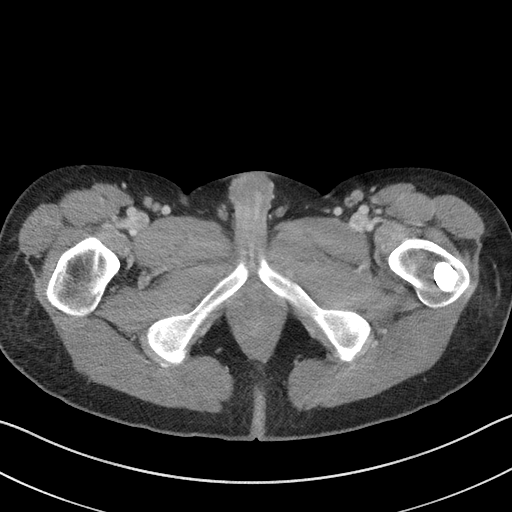
[im 7/97  bone]
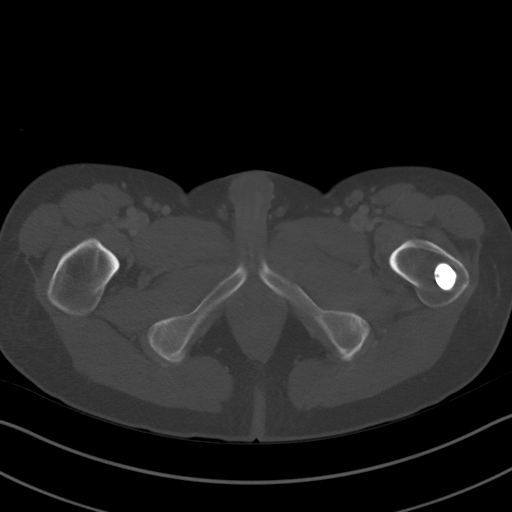
[im 14/97  soft-tissue]
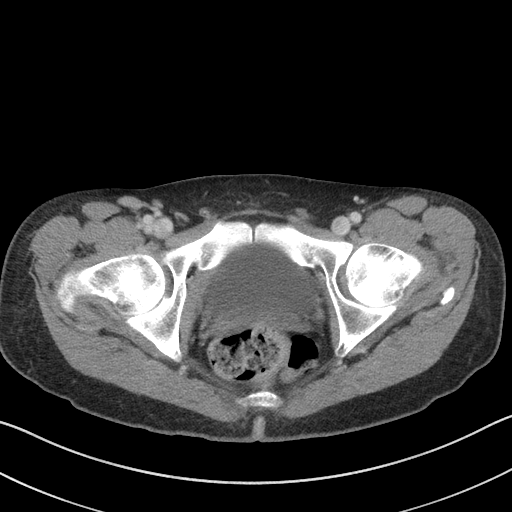
[im 21/97  soft-tissue]
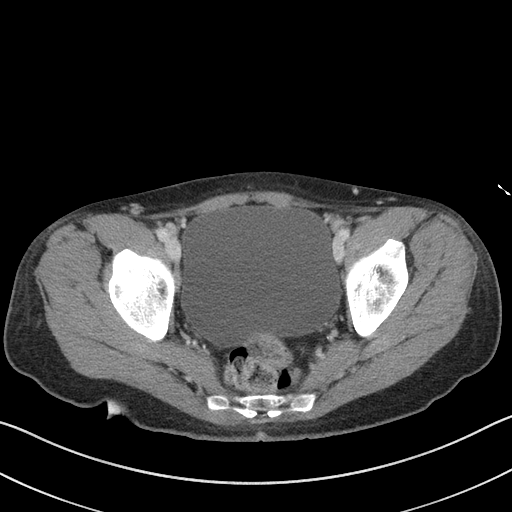
[im 28/97  soft-tissue]
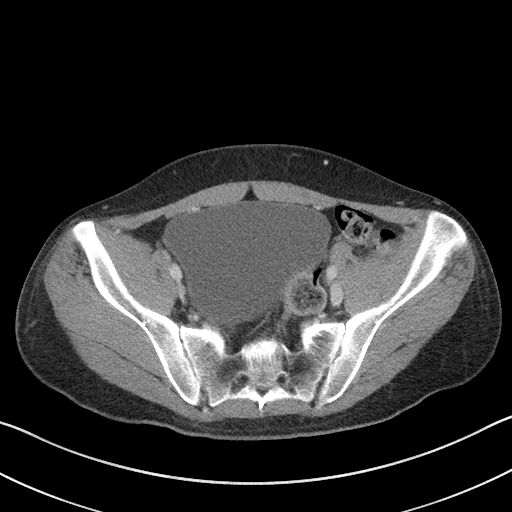
[im 35/97  soft-tissue]
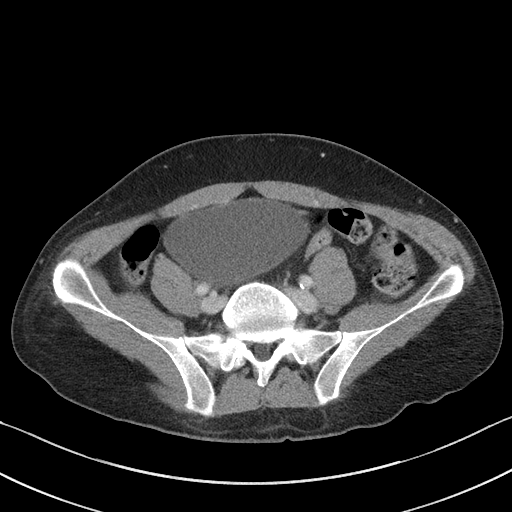
[im 42/97  soft-tissue]
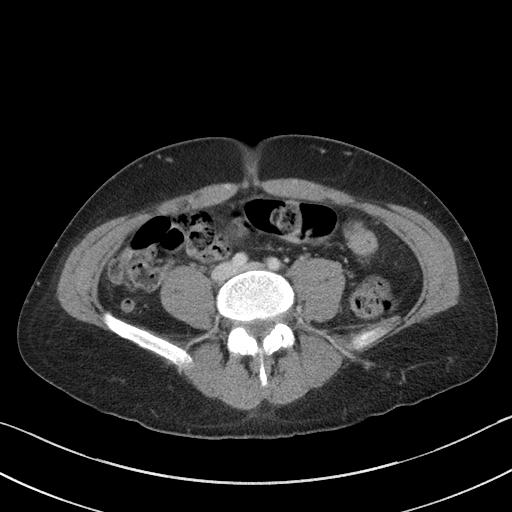
[im 49/97  soft-tissue]
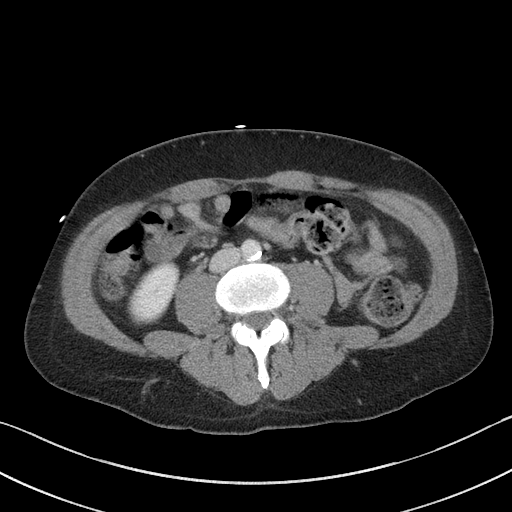
[im 55/97  soft-tissue]
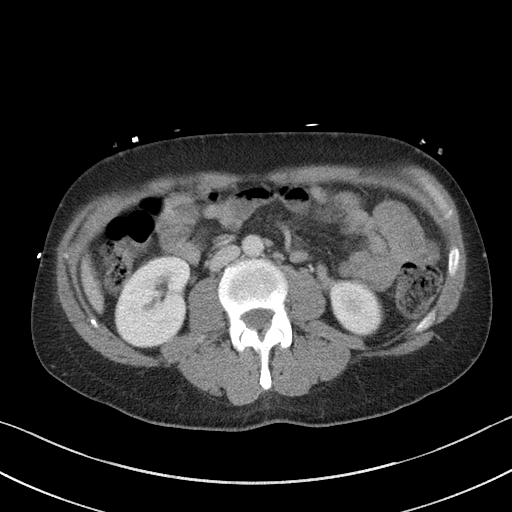
[im 62/97  soft-tissue]
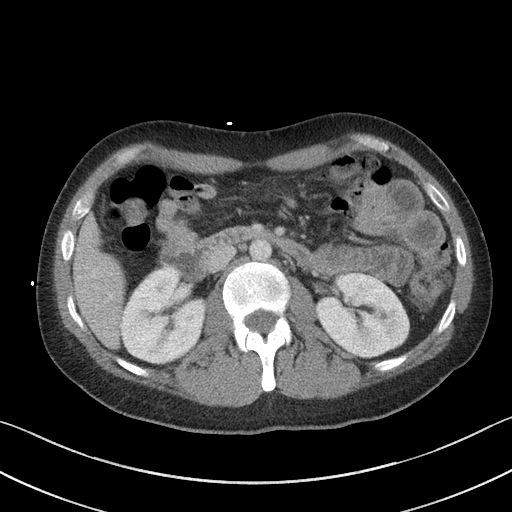
[im 62/97  bone]
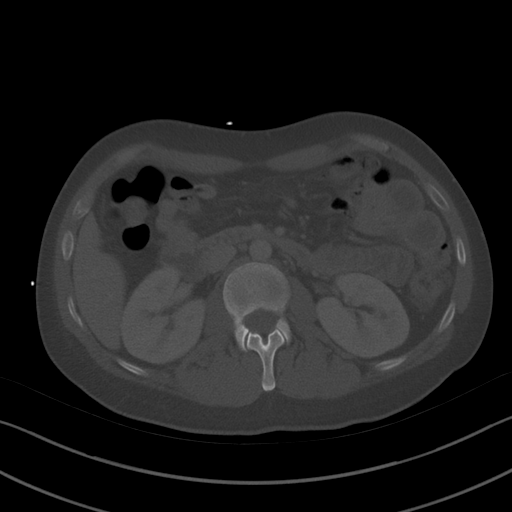
[im 69/97  soft-tissue]
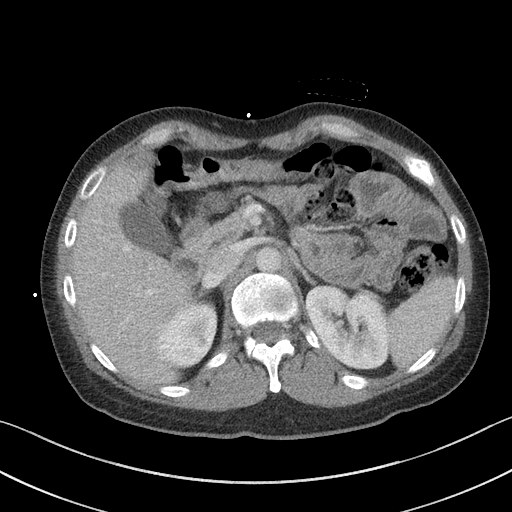
[im 76/97  soft-tissue]
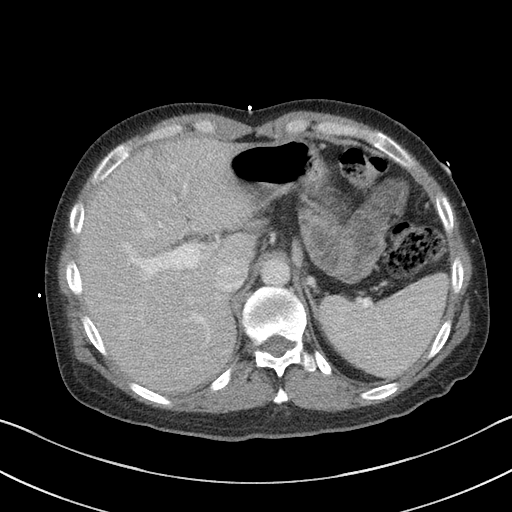
[im 83/97  soft-tissue]
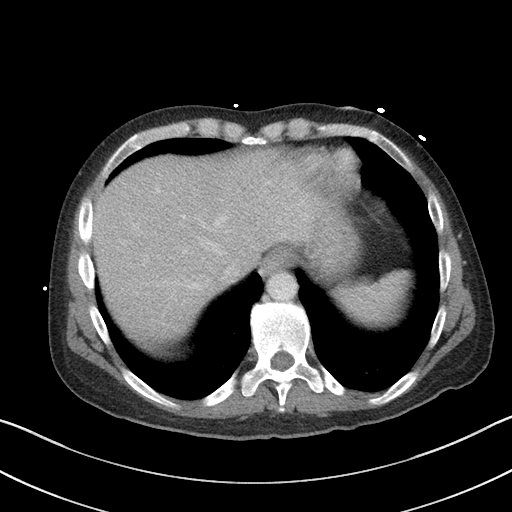
[im 90/97  soft-tissue]
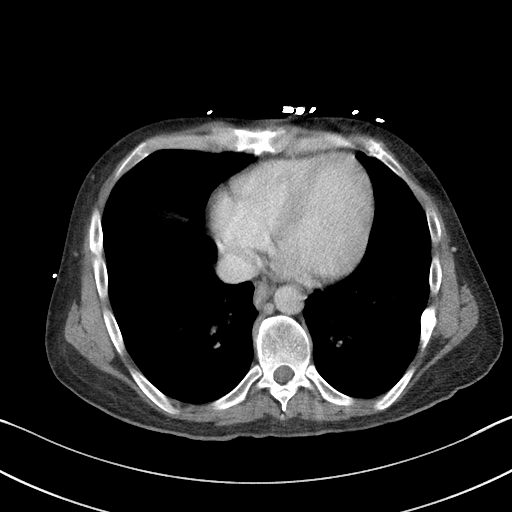

[16 of 46 positions shown; findings below may reference images not displayed]

RADIATION DOSE REDUCTION: This exam was performed according to the
departmental dose-optimization program which includes automated
exposure control, adjustment of the mA and/or kV according to
patient size and/or use of iterative reconstruction technique.

CONTRAST:  100mL OMNIPAQUE IOHEXOL 300 MG/ML  SOLN
FINDINGS: There is moderate motion artifact throughout the abdomen.

Lower chest: Clear lung bases.

Hepatobiliary: Chronic hypodensity in the left hepatic lobe along
the falciform ligament, potentially focal fat deposition.
Unremarkable gallbladder. No biliary dilatation.

Pancreas: Unremarkable.

Spleen: Unremarkable.

Adrenals/Urinary Tract: Unremarkable adrenal glands. New calculi in
the upper and lower poles of the right kidney measuring up to 6 mm
in size. No hydronephrosis or renal mass. Moderately distended
bladder.

Stomach/Bowel: The stomach is unremarkable. There is no evidence of
bowel obstruction or inflammation. No rectal foreign body is
identified. Objects noted on today's earlier radiographs were likely
external to the patient. The appendix is unremarkable.

Vascular/Lymphatic: Mild abdominal aortic atherosclerosis without
aneurysm. No enlarged lymph nodes.

Reproductive: Unremarkable prostate.

Other: No ascites or pneumoperitoneum.

Musculoskeletal: Old bilateral rib fractures. Partially visualized
left femoral ORIF.
IMPRESSION: 1. No rectal foreign body or other acute abnormality identified.
2. Nonobstructing right nephrolithiasis.
3. Aortic Atherosclerosis ([id number]).

## 2022-12-12 ENCOUNTER — Emergency Department (HOSPITAL_COMMUNITY)
Admission: EM | Admit: 2022-12-12 | Discharge: 2022-12-12 | Disposition: A | Discharge status: Home / Self Care | Attending: Emergency Medicine | Admitting: Emergency Medicine

## 2022-12-12 DIAGNOSIS — T50901A Poisoning by unspecified drugs, medicaments and biological substances, accidental (unintentional), initial encounter: Secondary | ICD-10-CM | POA: Insufficient documentation

## 2022-12-12 DIAGNOSIS — J45909 Unspecified asthma, uncomplicated: Secondary | ICD-10-CM | POA: Insufficient documentation

## 2022-12-12 DIAGNOSIS — F1910 Other psychoactive substance abuse, uncomplicated: Secondary | ICD-10-CM

## 2022-12-12 LAB — CBG MONITORING, ED
Glucose-Capillary: 45 mg/dL — ABNORMAL LOW (ref 70–99)
Glucose-Capillary: 134 mg/dL — ABNORMAL HIGH (ref 70–99)

## 2022-12-12 MED ORDER — NALOXONE HCL 4 MG/0.1ML NA LIQD: 1.0000 | Freq: Once | NASAL | Status: DC

## 2022-12-12 MED ORDER — NALOXONE HCL 2 MG/2ML IJ SOSY
PREFILLED_SYRINGE | INTRAMUSCULAR | Status: AC
  Filled 2022-12-12: qty 2

## 2022-12-12 MED ORDER — NALOXONE HCL 4 MG/0.1ML NA LIQD: NASAL | 0 refills | Status: DC

## 2022-12-12 NOTE — ED Triage Notes (Signed)
E[facility] brings pt in for overdose. Pt states he was smoking weed. Pt given 1mg  of Narcan IN and 4mg  Zofran IV by ems.

## 2022-12-12 NOTE — ED Provider Notes (Signed)
Clinical Course as of 12/12/22 2311  Tue Dec 12, 2022  1558 Stable drug overdose.  Unintentional. PO challenge and DC after repeat CBG. [CC]  1559 Ready for DC otherwise. [CC]  1631 Went to bathroom. Came back with AMS. Concern for reexposure per prior provider. Given more narcan at 38 [CC]    Clinical Course User Index [CC] Tretha Sciara, MD   Observed for a total of 9 hours.  Symptomatically resolved.  Back to baseline GCS.     Tretha Sciara, MD 12/12/22 2312

## 2022-12-12 NOTE — ED Provider Notes (Signed)
[facility] DEPT Provider Note   CSN: [id number] Arrival date & time: 12/12/22  1308     History  Chief Complaint  Patient presents with   Drug Overdose    Jason Buchanan is a 37 y.o. male.  HPI   37 year old male with medical history significant for asthma, mood disorder, polysubstance abuse who presents to the emergency department with concern for overdose.  The patient history was provided by EMS.  They state that he the patient had informed them that he was smoking weed.  He was administered 1 mg of Narcan intranasally and 4 mg of Zofran by EMS.  The patient tells me as he is now returned to his baseline that he thought he was smoking peyote and thinks that it was "laced with something."  He denies any intentional overdose.  He states that this was accidental.  He denies any SI, HI or AVH.  He denies any chest pain or shortness of breath.  The patient is homeless and requested food as he had not had anything to eat in the past day.  Home Medications Prior to Admission medications   Medication Sig Start Date End Date Taking? Authorizing Provider  amitriptyline (ELAVIL) 25 MG tablet Take 1 tablet (25 mg total) by mouth at bedtime. Patient not taking: Reported on 07/13/2022 01/17/22 02/27/22  Donne Hazel, MD  celecoxib (CELEBREX) 200 MG capsule Take 1 capsule (200 mg total) by mouth 2 (two) times daily. 07/28/22   Margarita Mail, PA-C  cephALEXin (KEFLEX) 500 MG capsule Take 1 capsule (500 mg total) by mouth 4 (four) times daily. 07/13/22   Malvin Johns, MD  doxycycline (VIBRAMYCIN) 100 MG capsule Take 1 capsule (100 mg total) by mouth 2 (two) times daily. One po bid x 7 days 07/28/22   Margarita Mail, PA-C  ferrous sulfate 325 (65 FE) MG tablet Take 1 tablet (325 mg total) by mouth daily with breakfast. Patient not taking: Reported on 07/13/2022 01/18/22 04/08/22  Donne Hazel, MD  gabapentin (NEURONTIN) 400 MG capsule Take 1 capsule (400 mg total) by  mouth 3 (three) times daily. Patient not taking: Reported on 07/13/2022 01/17/22 04/08/22  Donne Hazel, MD  methocarbamol (ROBAXIN) 500 MG tablet Take 1 tablet (500 mg total) by mouth every 8 (eight) hours as needed for muscle spasms. Patient not taking: Reported on 02/27/2022 01/17/22   Donne Hazel, MD  NAPROSYN 500 MG tablet Take 500 mg by mouth 2 (two) times daily as needed. 07/18/22   [provider]  oxycodone (OXY-IR) 5 MG capsule Take 1 capsule (5 mg total) by mouth every 4 (four) hours as needed. Patient not taking: Reported on 02/27/2022 01/17/22   Donne Hazel, MD  pantoprazole (PROTONIX) 40 MG tablet Take 1 tablet (40 mg total) by mouth daily. Patient not taking: Reported on 07/13/2022 01/18/22 04/08/22  Donne Hazel, MD  QUEtiapine (SEROQUEL XR) 300 MG 24 hr tablet Take 1 tablet (300 mg total) by mouth at bedtime. Patient not taking: Reported on 07/13/2022 01/17/22 04/08/22  Donne Hazel, MD      Allergies    Bee venom, Succinylcholine, Chlorhexidine, and Naproxen    Review of Systems   Review of Systems  All other systems reviewed and are negative.   Physical Exam Updated Vital Signs BP 99/73 (BP Location: Left Arm)   Pulse 99   Resp 18   SpO2 96%  Physical Exam Vitals and nursing note reviewed.  Constitutional:  General: He is not in acute distress.    Appearance: He is well-developed.  HENT:     Head: Normocephalic and atraumatic.  Eyes:     Conjunctiva/sclera: Conjunctivae normal.  Cardiovascular:     Rate and Rhythm: Normal rate and regular rhythm.  Pulmonary:     Effort: Pulmonary effort is normal. No respiratory distress.     Breath sounds: Normal breath sounds.  Abdominal:     Palpations: Abdomen is soft.     Tenderness: There is no abdominal tenderness.  Musculoskeletal:        General: No swelling.     Cervical back: Neck supple.  Skin:    General: Skin is warm and dry.     Capillary Refill: Capillary refill takes less than 2  seconds.  Neurological:     General: No focal deficit present.     Mental Status: He is alert and oriented to person, place, and time. Mental status is at baseline.  Psychiatric:        Mood and Affect: Mood normal.        Behavior: Behavior normal.        Thought Content: Thought content normal.        Judgment: Judgment normal.     ED Results / Procedures / Treatments   Labs (all labs ordered are listed, but only abnormal results are displayed) Labs Reviewed  CBG MONITORING, ED - Abnormal; Notable for the following components:      Result Value   Glucose-Capillary 45 (*)    All other components within normal limits  CBG MONITORING, ED - Abnormal; Notable for the following components:   Glucose-Capillary 134 (*)    All other components within normal limits    EKG None  Radiology No results found.  Procedures Procedures    Medications Ordered in ED Medications - No data to display  ED Course/ Medical Decision Making/ A&P Clinical Course as of 12/12/22 1612  Tue Dec 12, 2022  1558 Stable drug overdose.  Unintentional. PO challenge and DC after repeat CBG. [CC]  1559 Ready for DC otherwise. [CC]    Clinical Course User Index [CC] Glyn Ade, MD                           Medical Decision Making   37 year old male with medical history significant for asthma, mood disorder, polysubstance abuse who presents to the emergency department with concern for overdose.  The patient history was provided by EMS.  They state that he the patient had informed them that he was smoking weed.  He was administered 1 mg of Narcan intranasally and 4 mg of Zofran by EMS.  The patient tells me as he is now returned to his baseline that he thought he was smoking peyote and thinks that it was "laced with something."  He denies any intentional overdose.  He states that this was accidental.  He denies any SI, HI or AVH.  He denies any chest pain or shortness of breath.  The patient is  homeless and requested food as he had not had anything to eat in the past day.  On arrival, the patient was vitally stable.  Initial CBG revealed hypoglycemia however the patient was mentating appropriately and requesting food.  Repeat CBG pending, EKG ordered.  Will obstipation in the emergency department given his overdose and reassess pending recheck of his blood glucose.  No need for Narcan administration in the ED  given his return to baseline.  Signout given to Dr. Oswald Hillock at [phone].   Final Clinical Impression(s) / ED Diagnoses Final diagnoses:  Accidental overdose, initial encounter  Polysubstance abuse [facility])    Rx / DC Orders ED Discharge Orders     None         Regan Lemming, MD 12/12/22 [phone]

## 2023-01-10 ENCOUNTER — Emergency Department (HOSPITAL_COMMUNITY)
Admission: EM | Admit: 2023-01-10 | Discharge: 2023-01-11 | Disposition: A | Discharge status: Home / Self Care | Payer: Self-pay | Attending: Emergency Medicine | Admitting: Emergency Medicine

## 2023-01-10 ENCOUNTER — Emergency Department (HOSPITAL_COMMUNITY): Payer: Self-pay

## 2023-01-10 ENCOUNTER — Other Ambulatory Visit: Payer: Self-pay

## 2023-01-10 ENCOUNTER — Encounter (HOSPITAL_COMMUNITY): Payer: Self-pay

## 2023-01-10 DIAGNOSIS — T50901A Poisoning by unspecified drugs, medicaments and biological substances, accidental (unintentional), initial encounter: Secondary | ICD-10-CM

## 2023-01-10 DIAGNOSIS — R000 Tachycardia, unspecified: Secondary | ICD-10-CM | POA: Insufficient documentation

## 2023-01-10 DIAGNOSIS — R4182 Altered mental status, unspecified: Secondary | ICD-10-CM | POA: Insufficient documentation

## 2023-01-10 DIAGNOSIS — R5383 Other fatigue: Secondary | ICD-10-CM | POA: Insufficient documentation

## 2023-01-10 DIAGNOSIS — R0689 Other abnormalities of breathing: Secondary | ICD-10-CM | POA: Insufficient documentation

## 2023-01-10 DIAGNOSIS — Y900 Blood alcohol level of less than 20 mg/100 ml: Secondary | ICD-10-CM | POA: Insufficient documentation

## 2023-01-10 DIAGNOSIS — T402X1A Poisoning by other opioids, accidental (unintentional), initial encounter: Secondary | ICD-10-CM | POA: Insufficient documentation

## 2023-01-10 HISTORY — DX: Schizophrenia, unspecified: F20.9

## 2023-01-10 LAB — COMPREHENSIVE METABOLIC PANEL
ALT: 72 U/L — ABNORMAL HIGH (ref 0–44)
AST: 82 U/L — ABNORMAL HIGH (ref 15–41)
Albumin: 3.7 g/dL (ref 3.5–5.0)
Alkaline Phosphatase: 158 U/L — ABNORMAL HIGH (ref 38–126)
Anion gap: 10 (ref 5–15)
BUN: 8 mg/dL (ref 6–20)
CO2: 24 mmol/L (ref 22–32)
Calcium: 8.9 mg/dL (ref 8.9–10.3)
Chloride: 103 mmol/L (ref 98–111)
Creatinine, Ser: 0.65 mg/dL (ref 0.61–1.24)
GFR, Estimated: >60 mL/min (ref >60)
Glucose, Bld: 105 mg/dL — ABNORMAL HIGH (ref 70–99)
Potassium: 4.7 mmol/L (ref 3.5–5.1)
Sodium: 137 mmol/L (ref 135–145)
Total Bilirubin: 1.1 mg/dL (ref 0.3–1.2)
Total Protein: 6.9 g/dL (ref 6.5–8.1)

## 2023-01-10 LAB — CBC WITH DIFFERENTIAL/PLATELET
Abs Immature Granulocytes: 0.04 10*3/uL (ref 0.00–0.07)
Basophils Absolute: 0.1 10*3/uL (ref 0.0–0.1)
Basophils Relative: 1 %
Eosinophils Absolute: 0.2 10*3/uL (ref 0.0–0.5)
Eosinophils Relative: 2 %
HCT: 41.7 % (ref 39.0–52.0)
Hemoglobin: 13.9 g/dL (ref 13.0–17.0)
Immature Granulocytes: 0 %
Lymphocytes Relative: 12 %
Lymphs Abs: 1.6 10*3/uL (ref 0.7–4.0)
MCH: 30.2 pg (ref 26.0–34.0)
MCHC: 33.3 g/dL (ref 30.0–36.0)
MCV: 90.5 fL (ref 80.0–100.0)
Monocytes Absolute: 0.7 10*3/uL (ref 0.1–1.0)
Monocytes Relative: 6 %
Neutro Abs: 10.7 10*3/uL — ABNORMAL HIGH (ref 1.7–7.7)
Neutrophils Relative %: 79 %
Platelets: 290 10*3/uL (ref 150–400)
RBC: 4.61 MIL/uL (ref 4.22–5.81)
RDW: 11.9 % (ref 11.5–15.5)
WBC: 13.4 10*3/uL — ABNORMAL HIGH (ref 4.0–10.5)
nRBC: 0 % (ref 0.0–0.2)

## 2023-01-10 LAB — ACETAMINOPHEN LEVEL: Acetaminophen (Tylenol), Serum: <10 ug/mL — ABNORMAL LOW (ref 10–30)

## 2023-01-10 LAB — ETHANOL: Alcohol, Ethyl (B): <10 mg/dL (ref <10)

## 2023-01-10 LAB — CBG MONITORING, ED: Glucose-Capillary: 123 mg/dL — ABNORMAL HIGH (ref 70–99)

## 2023-01-10 LAB — SALICYLATE LEVEL: Salicylate Lvl: <7 mg/dL — ABNORMAL LOW (ref 7.0–30.0)

## 2023-01-10 MED ORDER — NALOXONE HCL 4 MG/0.1ML NA LIQD: NASAL | 0 refills | Status: DC

## 2023-01-10 MED ORDER — SODIUM CHLORIDE 0.9 % IV BOLUS
1000.0000 mL | Freq: Once | INTRAVENOUS | Status: AC
  Administered 2023-01-10: 1000 mL via INTRAVENOUS

## 2023-01-10 MED ORDER — SODIUM CHLORIDE 0.9 % IV BOLUS: 1000.0000 mL | Freq: Once | INTRAVENOUS | Status: DC

## 2023-01-10 MED ORDER — ONDANSETRON HCL 4 MG/2ML IJ SOLN
INTRAMUSCULAR | Status: AC
  Administered 2023-01-10: 4 mg
  Filled 2023-01-10: qty 2

## 2023-01-10 MED ORDER — NALOXONE HCL 2 MG/2ML IJ SOSY
PREFILLED_SYRINGE | INTRAMUSCULAR | Status: AC
  Administered 2023-01-10: 2 mg
  Filled 2023-01-10: qty 4

## 2023-01-10 NOTE — ED Provider Notes (Signed)
[facility] Provider Note  CSN: [id number] Arrival date & time: 01/10/23 1819  Chief Complaint(s) No chief complaint on file.  HPI Jason Buchanan is a 37 y.o. male with past medical history as below, significant for unknown medical history who presents to the ED with complaint of AMS.  Patient was dropped off in the waiting area by 2 unknown females who promptly left the treatment area.  Patient with reduced responsiveness on arrival, reduced respiratory rate, pupils are constricted.  Was given Narcan with immediate improvement of respiratory and mental status.  Patient denies any recent drug use but he thinks possibly been slipped something by the females who are with him previously.  Reports he was just "hanging out on family" and the 2 females brought him to the emergency department.  Currently denies SI or HI.   Past Medical History Past Medical History:  Diagnosis Date   Schizophrenia ([place])    There are no problems to display for this patient.  Home Medication(s) Prior to Admission medications   Medication Sig Start Date End Date Taking? Authorizing Provider  naloxone [facility]) nasal spray 4 mg/0.1 mL Take in case of overdose, call 911 01/10/23  Yes Jeanell Sparrow, DO                                                                                                                                    Past Surgical History History reviewed. No pertinent surgical history. Family History History reviewed. No pertinent family history.  Social History Social History   Substance Use Topics   Drug use: Yes    Comment: Opioids   Allergies Patient has no allergy information on record.  Review of Systems Review of Systems  Unable to perform ROS: Mental status change    Physical Exam Vital Signs  I have reviewed the triage vital signs BP (!) 135/90   Pulse (!) 103   Temp 98.2 F (36.8 C) (Oral)   Resp 18   Ht 5\' 6"  (1.676 m)   SpO2  100%  Physical Exam Vitals and nursing note reviewed. Exam conducted with a chaperone present.  Constitutional:      General: He is in acute distress.     Appearance: Normal appearance. He is not ill-appearing or toxic-appearing.  HENT:     Head: Normocephalic and atraumatic.     Right Ear: External ear normal.     Left Ear: External ear normal.     Nose: Nose normal.     Mouth/Throat:     Mouth: Mucous membranes are dry.  Eyes:     General: No scleral icterus.       Right eye: No discharge.        Left eye: No discharge.     Comments: Pupils constricted bilateral  Cardiovascular:     Rate and Rhythm: Regular rhythm. Tachycardia present.     Pulses:  Normal pulses.     Heart sounds: Normal heart sounds. No murmur heard. Pulmonary:     Effort: Bradypnea present.     Breath sounds: No stridor.  Abdominal:     General: Abdomen is flat.     Palpations: Abdomen is soft.     Tenderness: There is no abdominal tenderness. There is no guarding or rebound.  Musculoskeletal:     Cervical back: No rigidity.  Neurological:     Mental Status: He is lethargic.     GCS: GCS eye subscore is 2. GCS verbal subscore is 3. GCS motor subscore is 4.     Comments: Arousable to sternal rub Responsive to noxious stimulus in 4 extremities Otherwise not compliant with neurologic testing     ED Results and Treatments Labs (all labs ordered are listed, but only abnormal results are displayed) Labs Reviewed  COMPREHENSIVE METABOLIC PANEL - Abnormal; Notable for the following components:      Result Value   Glucose, Bld 105 (*)    AST 82 (*)    ALT 72 (*)    Alkaline Phosphatase 158 (*)    All other components within normal limits  SALICYLATE LEVEL - Abnormal; Notable for the following components:   Salicylate Lvl <2.6 (*)    All other components within normal limits  ACETAMINOPHEN LEVEL - Abnormal; Notable for the following components:   Acetaminophen (Tylenol), Serum <10 (*)    All other  components within normal limits  CBC WITH DIFFERENTIAL/PLATELET - Abnormal; Notable for the following components:   WBC 13.4 (*)    Neutro Abs 10.7 (*)    All other components within normal limits  CBG MONITORING, ED - Abnormal; Notable for the following components:   Glucose-Capillary 123 (*)    All other components within normal limits  ETHANOL  RAPID URINE DRUG SCREEN, HOSP PERFORMED                                                                                                                          Radiology DG Chest Portable 1 View  Result Date: 01/10/2023 CLINICAL DATA:  Overdose.  Unresponsive EXAM: PORTABLE CHEST 1 VIEW COMPARISON:  01/24/2019 FINDINGS: Normal heart size and pulmonary vascularity. No focal airspace disease or consolidation in the lungs. No blunting of costophrenic angles. No pneumothorax. Mediastinal contours appear intact. Shallow inspiration. Old rib fractures. IMPRESSION: No active disease. Electronically Signed   By: Lucienne Capers M.D.   On: 01/10/2023 19:24    Pertinent labs & imaging results that were available during my care of the patient were reviewed by me and considered in my medical decision making (see MDM for details).  Medications Ordered in ED Medications  sodium chloride 0.9 % bolus 1,000 mL (has no administration in time range)  ondansetron (ZOFRAN) 4 MG/2ML injection (4 mg  Given 01/10/23 1844)  naloxone (NARCAN) 2 MG/2ML injection (2 mg  Given 01/10/23 1844)  sodium chloride 0.9 % bolus 1,000 mL (0 mLs  Intravenous Stopped 01/10/23 2140)                                                                                                                                     Procedures .Critical Care  Performed by: Sloan Leiter, DO Authorized by: Sloan Leiter, DO   Critical care provider statement:    Critical care time (minutes):  30   Critical care time was exclusive of:  Separately billable procedures and treating other patients   Critical  care was necessary to treat or prevent imminent or life-threatening deterioration of the following conditions:  Toxidrome   Critical care was time spent personally by me on the following activities:  Development of treatment plan with patient or surrogate, discussions with consultants, evaluation of patient's response to treatment, examination of patient, ordering and review of laboratory studies, ordering and review of radiographic studies, ordering and performing treatments and interventions, pulse oximetry, re-evaluation of patient's condition, review of old charts and obtaining history from patient or surrogate   (including critical care time)  Medical Decision Making / ED Course    Medical Decision Making:    Jason Buchanan is a 37 y.o. male unknown medical hx, arrived as DOE, here with AMS. The complaint involves an extensive differential diagnosis and also carries with it a high risk of complications and morbidity.  Serious etiology was considered. Ddx includes but is not limited to: Differential diagnoses for altered mental status includes but is not exclusive to alcohol, illicit or prescription medications, intracranial pathology such as stroke, intracerebral hemorrhage, fever or infectious causes including sepsis, hypoxemia, uremia, trauma, endocrine related disorders such as diabetes, hypoglycemia, thyroid-related diseases, etc.   Complete initial physical exam performed, notably the patient  was bradypnea, pupils constricted.  Given Narcan with immediate improvement collect screening labs, chest x-ray, give IV fluids as he is tachycardic.  Get EKG; he has no chest pain, no dyspnea or nausea.    Reviewed and confirmed nursing documentation for past medical history, family history, social history.  Vital signs reviewed.    Clinical Course as of 01/11/23 0006  Wed Jan 10, 2023  4098 Patient was given 2 mg of IV Narcan with near immediate improvement to his mental and respiratory status.  [SG]  2038 Pt reports use of illicit drugs pta, homeless, remorseful in regards to his drug use. No SI or HI [SG]    Clinical Course User Index [SG] Sloan Leiter, DO   Patient here with apparent accidental drug overdose of opiates. Apnea on arrival, arousal to sternal rub, pupils constricted b/l. He was given Narcan on arrival with immediate improvement to his symptoms.  He appears to back to his baseline at this time.  He is ambulatory with steady gait.  Respiratory status stable.  Advised patient refrain from opiate use in the future.  Given Rx for Narcan.  No SI or HI.  Given outpatient rehab  instructions.  Pt refusing to provide urine sample  Neuro exam is non focal at time of discharge, gait steady, clinically sober  Pt making threatening remarks at time of discharge because we are out of cab vouchers.   The patient improved significantly and was discharged in stable condition. Detailed discussions were had with the patient regarding current findings, and need for close f/u with PCP or on call doctor. The patient has been instructed to return immediately if the symptoms worsen in any way for re-evaluation. Patient verbalized understanding and is in agreement with current care plan. All questions answered prior to discharge.    Additional history obtained: -Additional history obtained from na -External records from outside source obtained and reviewed including: Chart review including previous notes, labs, imaging, consultation notes including prior ED visits, prior labs and imaging, home medications   Lab Tests: -I ordered, reviewed, and interpreted labs.   The pertinent results include:   Labs Reviewed  COMPREHENSIVE METABOLIC PANEL - Abnormal; Notable for the following components:      Result Value   Glucose, Bld 105 (*)    AST 82 (*)    ALT 72 (*)    Alkaline Phosphatase 158 (*)    All other components within normal limits  SALICYLATE LEVEL - Abnormal; Notable for the  following components:   Salicylate Lvl <7.0 (*)    All other components within normal limits  ACETAMINOPHEN LEVEL - Abnormal; Notable for the following components:   Acetaminophen (Tylenol), Serum <10 (*)    All other components within normal limits  CBC WITH DIFFERENTIAL/PLATELET - Abnormal; Notable for the following components:   WBC 13.4 (*)    Neutro Abs 10.7 (*)    All other components within normal limits  CBG MONITORING, ED - Abnormal; Notable for the following components:   Glucose-Capillary 123 (*)    All other components within normal limits  ETHANOL  RAPID URINE DRUG SCREEN, HOSP PERFORMED    Notable for mild leukocytosis , likely stress response 2/2 OD   EKG   EKG Interpretation  Date/Time:    Ventricular Rate:    PR Interval:    QRS Duration:   QT Interval:    QTC Calculation:   R Axis:     Text Interpretation:        EKG reviewed by myself, sinus tachy, mismatch noted on muse.    Imaging Studies ordered: I ordered imaging studies including cxr I independently visualized the following imaging with scope of interpretation limited to determining acute life threatening conditions related to emergency care: cxr, which revealed no acute changes I independently visualized and interpreted imaging. I agree with the radiologist interpretation   Medicines ordered and prescription drug management: Meds ordered this encounter  Medications   ondansetron (ZOFRAN) 4 MG/2ML injection    Marylene Land R: cabinet override   naloxone [facility]) 2 MG/2ML injection    Marylene Land R: cabinet override   sodium chloride 0.9 % bolus 1,000 mL   sodium chloride 0.9 % bolus 1,000 mL   naloxone (NARCAN) nasal spray 4 mg/0.1 mL    Sig: Take in case of overdose, call 911    Dispense:  1 each    Refill:  0    -I have reviewed the patients home medicines and have made adjustments as needed   Consultations Obtained: na   Cardiac Monitoring: The patient was maintained  on a cardiac monitor.  I personally viewed and interpreted the cardiac monitored which showed an underlying  rhythm of: sinus tachy  Social Determinants of Health:  Diagnosis or treatment significantly limited by social determinants of health: polysubstance abuse and homelessness   Reevaluation: After the interventions noted above, I reevaluated the patient and found that they have resolved  Co morbidities that complicate the patient evaluation  Past Medical History:  Diagnosis Date   Schizophrenia ([place])       Dispostion: Disposition decision including need for hospitalization was considered, and patient discharged from emergency department.    Final Clinical Impression(s) / ED Diagnoses Final diagnoses:  Accidental overdose, initial encounter     This chart was dictated using voice recognition software.  Despite best efforts to proofread,  errors can occur which can change the documentation meaning.    Jeanell Sparrow, DO 01/11/23 0006

## 2023-01-10 NOTE — ED Provider Notes (Incomplete)
[facility] Provider Note  CSN: [id number] Arrival date & time: 01/10/23 1819  Chief Complaint(s) No chief complaint on file.  HPI Jason Buchanan is a 37 y.o. male with past medical history as below, significant for unknown medical history who presents to the ED with complaint of AMS.  Patient was dropped off in the waiting area by 2 unknown females who promptly left the treatment area.  Patient with reduced responsiveness on arrival, reduced respiratory rate, pupils are constricted.  Was given Narcan with immediate improvement of respiratory and mental status.  Patient denies any recent drug use but he thinks possibly been slipped something by the females who are with him previously.  Reports he was just "hanging out on family" and the 2 females brought him to the emergency department.  Currently denies SI or HI.   Past Medical History Past Medical History:  Diagnosis Date  . Schizophrenia ([place])    There are no problems to display for this patient.  Home Medication(s) Prior to Admission medications   Medication Sig Start Date End Date Taking? Authorizing Provider  naloxone [facility]) nasal spray 4 mg/0.1 mL Take in case of overdose, call 911 01/10/23  Yes Jeanell Sparrow, DO                                                                                                                                    Past Surgical History History reviewed. No pertinent surgical history. Family History History reviewed. No pertinent family history.  Social History Social History   Substance Use Topics  . Drug use: Yes    Comment: Opioids   Allergies Patient has no allergy information on record.  Review of Systems Review of Systems  Unable to perform ROS: Mental status change    Physical Exam Vital Signs  I have reviewed the triage vital signs BP (!) 135/90   Pulse (!) 103   Temp 98.2 F (36.8 C) (Oral)   Resp 18   Ht 5\' 6"  (1.676 m)   SpO2  100%  Physical Exam Vitals and nursing note reviewed. Exam conducted with a chaperone present.  Constitutional:      General: He is in acute distress.     Appearance: Normal appearance. He is not ill-appearing or toxic-appearing.  HENT:     Head: Normocephalic and atraumatic.     Right Ear: External ear normal.     Left Ear: External ear normal.     Nose: Nose normal.     Mouth/Throat:     Mouth: Mucous membranes are dry.  Eyes:     General: No scleral icterus.       Right eye: No discharge.        Left eye: No discharge.     Comments: Pupils constricted bilateral  Cardiovascular:     Rate and Rhythm: Regular rhythm. Tachycardia present.     Pulses:  Normal pulses.     Heart sounds: Normal heart sounds. No murmur heard. Pulmonary:     Effort: Bradypnea present.     Breath sounds: No stridor.  Abdominal:     General: Abdomen is flat.     Palpations: Abdomen is soft.     Tenderness: There is no abdominal tenderness. There is no guarding or rebound.  Musculoskeletal:     Cervical back: No rigidity.  Neurological:     Mental Status: He is lethargic.     GCS: GCS eye subscore is 2. GCS verbal subscore is 3. GCS motor subscore is 4.     Comments: Arousable to sternal rub Responsive to noxious stimulus in 4 extremities Otherwise not compliant with neurologic testing     ED Results and Treatments Labs (all labs ordered are listed, but only abnormal results are displayed) Labs Reviewed  COMPREHENSIVE METABOLIC PANEL - Abnormal; Notable for the following components:      Result Value   Glucose, Bld 105 (*)    AST 82 (*)    ALT 72 (*)    Alkaline Phosphatase 158 (*)    All other components within normal limits  SALICYLATE LEVEL - Abnormal; Notable for the following components:   Salicylate Lvl <2.6 (*)    All other components within normal limits  ACETAMINOPHEN LEVEL - Abnormal; Notable for the following components:   Acetaminophen (Tylenol), Serum <10 (*)    All other  components within normal limits  CBC WITH DIFFERENTIAL/PLATELET - Abnormal; Notable for the following components:   WBC 13.4 (*)    Neutro Abs 10.7 (*)    All other components within normal limits  CBG MONITORING, ED - Abnormal; Notable for the following components:   Glucose-Capillary 123 (*)    All other components within normal limits  ETHANOL  RAPID URINE DRUG SCREEN, HOSP PERFORMED                                                                                                                          Radiology DG Chest Portable 1 View  Result Date: 01/10/2023 CLINICAL DATA:  Overdose.  Unresponsive EXAM: PORTABLE CHEST 1 VIEW COMPARISON:  01/24/2019 FINDINGS: Normal heart size and pulmonary vascularity. No focal airspace disease or consolidation in the lungs. No blunting of costophrenic angles. No pneumothorax. Mediastinal contours appear intact. Shallow inspiration. Old rib fractures. IMPRESSION: No active disease. Electronically Signed   By: Lucienne Capers M.D.   On: 01/10/2023 19:24    Pertinent labs & imaging results that were available during my care of the patient were reviewed by me and considered in my medical decision making (see MDM for details).  Medications Ordered in ED Medications  sodium chloride 0.9 % bolus 1,000 mL (has no administration in time range)  ondansetron (ZOFRAN) 4 MG/2ML injection (4 mg  Given 01/10/23 1844)  naloxone (NARCAN) 2 MG/2ML injection (2 mg  Given 01/10/23 1844)  sodium chloride 0.9 % bolus 1,000 mL (0 mLs  Intravenous Stopped 01/10/23 2140)                                                                                                                                     Procedures .Critical Care  Performed by: Sloan Leiter, DO Authorized by: Sloan Leiter, DO   Critical care provider statement:    Critical care time (minutes):  30   Critical care time was exclusive of:  Separately billable procedures and treating other patients   Critical  care was necessary to treat or prevent imminent or life-threatening deterioration of the following conditions:  Toxidrome   Critical care was time spent personally by me on the following activities:  Development of treatment plan with patient or surrogate, discussions with consultants, evaluation of patient's response to treatment, examination of patient, ordering and review of laboratory studies, ordering and review of radiographic studies, ordering and performing treatments and interventions, pulse oximetry, re-evaluation of patient's condition, review of old charts and obtaining history from patient or surrogate   (including critical care time)  Medical Decision Making / ED Course    Medical Decision Making:    Dmonte Maher is a 37 y.o. male unknown medical hx, arrived as DOE, here with AMS. The complaint involves an extensive differential diagnosis and also carries with it a high risk of complications and morbidity.  Serious etiology was considered. Ddx includes but is not limited to: Differential diagnoses for altered mental status includes but is not exclusive to alcohol, illicit or prescription medications, intracranial pathology such as stroke, intracerebral hemorrhage, fever or infectious causes including sepsis, hypoxemia, uremia, trauma, endocrine related disorders such as diabetes, hypoglycemia, thyroid-related diseases, etc.   Complete initial physical exam performed, notably the patient  was bradypnea, pupils constricted.  Given Narcan with immediate improvement collect screening labs, chest x-ray, give IV fluids as he is tachycardic.  Get EKG; he has no chest pain, no dyspnea or nausea.    Reviewed and confirmed nursing documentation for past medical history, family history, social history.  Vital signs reviewed.    Clinical Course as of 01/11/23 0003  Wed Jan 10, 2023  2355 Patient was given 2 mg of IV Narcan with near immediate improvement to his mental and respiratory status.  [SG]  2038 Pt reports use of illicit drugs pta, homeless, remorseful in regards to his drug use. No SI or HI [SG]    Clinical Course User Index [SG] Sloan Leiter, DO   Patient here with apparent accidental drug overdose of opiates.  He was given Narcan on arrival with immediate improvement to his symptoms.  He appears to back to his baseline.  He is ambulatory with steady gait.  Respiratory status stable.  Advised patient refrain from opiate use in the future.  Given Rx for Narcan.  No SI or HI.  Given outpatient rehab instructions.   The patient improved significantly and was discharged in stable  condition. Detailed discussions were had with the patient regarding current findings, and need for close f/u with PCP or on call doctor. The patient has been instructed to return immediately if the symptoms worsen in any way for re-evaluation. Patient verbalized understanding and is in agreement with current care plan. All questions answered prior to discharge.    Additional history obtained: -Additional history obtained from na -External records from outside source obtained and reviewed including: Chart review including previous notes, labs, imaging, consultation notes including prior ED visits, prior labs and imaging, home medications   Lab Tests: -I ordered, reviewed, and interpreted labs.   The pertinent results include:   Labs Reviewed  COMPREHENSIVE METABOLIC PANEL - Abnormal; Notable for the following components:      Result Value   Glucose, Bld 105 (*)    AST 82 (*)    ALT 72 (*)    Alkaline Phosphatase 158 (*)    All other components within normal limits  SALICYLATE LEVEL - Abnormal; Notable for the following components:   Salicylate Lvl <0.9 (*)    All other components within normal limits  ACETAMINOPHEN LEVEL - Abnormal; Notable for the following components:   Acetaminophen (Tylenol), Serum <10 (*)    All other components within normal limits  CBC WITH DIFFERENTIAL/PLATELET -  Abnormal; Notable for the following components:   WBC 13.4 (*)    Neutro Abs 10.7 (*)    All other components within normal limits  CBG MONITORING, ED - Abnormal; Notable for the following components:   Glucose-Capillary 123 (*)    All other components within normal limits  ETHANOL  RAPID URINE DRUG SCREEN, HOSP PERFORMED    Notable for mild leukocytosis , likely stress response 2/2 OD   EKG   EKG Interpretation  Date/Time:    Ventricular Rate:    PR Interval:    QRS Duration:   QT Interval:    QTC Calculation:   R Axis:     Text Interpretation:           Imaging Studies ordered: I ordered imaging studies including cxr I independently visualized the following imaging with scope of interpretation limited to determining acute life threatening conditions related to emergency care: cxr, which revealed no acute changes I independently visualized and interpreted imaging. I agree with the radiologist interpretation   Medicines ordered and prescription drug management: Meds ordered this encounter  Medications  . ondansetron (ZOFRAN) 4 MG/2ML injection    Cyndia Diver R: cabinet override  . naloxone [facility]) 2 MG/2ML injection    Cyndia Diver R: cabinet override  . sodium chloride 0.9 % bolus 1,000 mL  . sodium chloride 0.9 % bolus 1,000 mL  . naloxone (NARCAN) nasal spray 4 mg/0.1 mL    Sig: Take in case of overdose, call 911    Dispense:  1 each    Refill:  0    -I have reviewed the patients home medicines and have made adjustments as needed   Consultations Obtained: na   Cardiac Monitoring: The patient was maintained on a cardiac monitor.  I personally viewed and interpreted the cardiac monitored which showed an underlying rhythm of: sinus tachy  Social Determinants of Health:  Diagnosis or treatment significantly limited by social determinants of health: polysubstance abuse and homelessness   Reevaluation: After the interventions noted above, I  reevaluated the patient and found that they have resolved  Co morbidities that complicate the patient evaluation . Past Medical History:  Diagnosis Date  .  Schizophrenia ([place])       Dispostion: Disposition decision including need for hospitalization was considered, and patient discharged from emergency department.    Final Clinical Impression(s) / ED Diagnoses Final diagnoses:  Accidental overdose, initial encounter     This chart was dictated using voice recognition software.  Despite best efforts to proofread,  errors can occur which can change the documentation meaning.

## 2023-01-10 NOTE — ED Triage Notes (Signed)
Pt dropped off by friends and left in lobby. Pt became unresponsive in the lobby and was brought back.

## 2023-01-10 NOTE — Discharge Instructions (Addendum)
It was a pleasure caring for you today in the emergency department.  Please return to the emergency department for any worsening or worrisome symptoms.  Please stop using illicit drugs

## 2023-01-11 ENCOUNTER — Encounter (HOSPITAL_COMMUNITY): Payer: Self-pay

## 2023-01-11 NOTE — ED Notes (Signed)
Pt refused VS before D/C

## 2023-01-13 ENCOUNTER — Encounter (HOSPITAL_COMMUNITY)
Admission: EM | Disposition: A | Discharge status: Home / Self Care | Payer: Self-pay | Source: Home / Self Care | Attending: Internal Medicine

## 2023-01-13 ENCOUNTER — Emergency Department (HOSPITAL_COMMUNITY): Payer: Self-pay

## 2023-01-13 ENCOUNTER — Emergency Department (HOSPITAL_BASED_OUTPATIENT_CLINIC_OR_DEPARTMENT_OTHER): Payer: Self-pay | Admitting: Anesthesiology

## 2023-01-13 ENCOUNTER — Emergency Department (HOSPITAL_COMMUNITY): Payer: Self-pay | Admitting: Anesthesiology

## 2023-01-13 ENCOUNTER — Other Ambulatory Visit: Payer: Self-pay

## 2023-01-13 ENCOUNTER — Encounter (HOSPITAL_COMMUNITY): Payer: Self-pay

## 2023-01-13 ENCOUNTER — Inpatient Hospital Stay (HOSPITAL_COMMUNITY)
Admission: EM | Admit: 2023-01-13 | Discharge: 2023-01-16 | DRG: 854 | Disposition: A | Discharge status: Home / Self Care | Payer: Self-pay | Attending: Internal Medicine | Admitting: Internal Medicine

## 2023-01-13 DIAGNOSIS — F1990 Other psychoactive substance use, unspecified, uncomplicated: Secondary | ICD-10-CM | POA: Diagnosis present

## 2023-01-13 DIAGNOSIS — Z79899 Other long term (current) drug therapy: Secondary | ICD-10-CM

## 2023-01-13 DIAGNOSIS — J45909 Unspecified asthma, uncomplicated: Secondary | ICD-10-CM

## 2023-01-13 DIAGNOSIS — Z91030 Bee allergy status: Secondary | ICD-10-CM

## 2023-01-13 DIAGNOSIS — F1110 Opioid abuse, uncomplicated: Secondary | ICD-10-CM | POA: Diagnosis present

## 2023-01-13 DIAGNOSIS — E869 Volume depletion, unspecified: Secondary | ICD-10-CM | POA: Diagnosis present

## 2023-01-13 DIAGNOSIS — Z886 Allergy status to analgesic agent status: Secondary | ICD-10-CM

## 2023-01-13 DIAGNOSIS — A408 Other streptococcal sepsis: Principal | ICD-10-CM | POA: Diagnosis present

## 2023-01-13 DIAGNOSIS — E872 Acidosis, unspecified: Secondary | ICD-10-CM | POA: Diagnosis present

## 2023-01-13 DIAGNOSIS — M65842 Other synovitis and tenosynovitis, left hand: Secondary | ICD-10-CM

## 2023-01-13 DIAGNOSIS — Z789 Other specified health status: Secondary | ICD-10-CM | POA: Diagnosis present

## 2023-01-13 DIAGNOSIS — Z888 Allergy status to other drugs, medicaments and biological substances status: Secondary | ICD-10-CM

## 2023-01-13 DIAGNOSIS — F209 Schizophrenia, unspecified: Secondary | ICD-10-CM | POA: Diagnosis present

## 2023-01-13 DIAGNOSIS — F39 Unspecified mood [affective] disorder: Secondary | ICD-10-CM | POA: Diagnosis present

## 2023-01-13 DIAGNOSIS — F17210 Nicotine dependence, cigarettes, uncomplicated: Secondary | ICD-10-CM | POA: Diagnosis present

## 2023-01-13 DIAGNOSIS — E876 Hypokalemia: Secondary | ICD-10-CM

## 2023-01-13 DIAGNOSIS — F1910 Other psychoactive substance abuse, uncomplicated: Secondary | ICD-10-CM | POA: Diagnosis present

## 2023-01-13 DIAGNOSIS — R7401 Elevation of levels of liver transaminase levels: Secondary | ICD-10-CM | POA: Diagnosis present

## 2023-01-13 DIAGNOSIS — F1010 Alcohol abuse, uncomplicated: Secondary | ICD-10-CM | POA: Diagnosis present

## 2023-01-13 DIAGNOSIS — F1123 Opioid dependence with withdrawal: Secondary | ICD-10-CM | POA: Diagnosis present

## 2023-01-13 DIAGNOSIS — L02512 Cutaneous abscess of left hand: Secondary | ICD-10-CM

## 2023-01-13 DIAGNOSIS — L03114 Cellulitis of left upper limb: Secondary | ICD-10-CM | POA: Diagnosis present

## 2023-01-13 DIAGNOSIS — D649 Anemia, unspecified: Secondary | ICD-10-CM

## 2023-01-13 DIAGNOSIS — A419 Sepsis, unspecified organism: Secondary | ICD-10-CM

## 2023-01-13 HISTORY — PX: I & D EXTREMITY: SHX5045

## 2023-01-13 LAB — COMPREHENSIVE METABOLIC PANEL
ALT: 47 U/L — ABNORMAL HIGH (ref 0–44)
AST: 33 U/L (ref 15–41)
Albumin: 3.2 g/dL — ABNORMAL LOW (ref 3.5–5.0)
Alkaline Phosphatase: 127 U/L — ABNORMAL HIGH (ref 38–126)
Anion gap: 11 (ref 5–15)
BUN: 8 mg/dL (ref 6–20)
CO2: 18 mmol/L — ABNORMAL LOW (ref 22–32)
Calcium: 8.5 mg/dL — ABNORMAL LOW (ref 8.9–10.3)
Chloride: 109 mmol/L (ref 98–111)
Creatinine, Ser: 0.39 mg/dL — ABNORMAL LOW (ref 0.61–1.24)
GFR, Estimated: >60 mL/min (ref >60)
Glucose, Bld: 127 mg/dL — ABNORMAL HIGH (ref 70–99)
Potassium: 2.4 mmol/L — CL (ref 3.5–5.1)
Sodium: 138 mmol/L (ref 135–145)
Total Bilirubin: 0.4 mg/dL (ref 0.3–1.2)
Total Protein: 6.7 g/dL (ref 6.5–8.1)

## 2023-01-13 LAB — PROTIME-INR
INR: 1 (ref 0.8–1.2)
Prothrombin Time: 13.1 seconds (ref 11.4–15.2)

## 2023-01-13 LAB — CBC WITH DIFFERENTIAL/PLATELET
Abs Immature Granulocytes: 0.06 10*3/uL (ref 0.00–0.07)
Basophils Absolute: 0 10*3/uL (ref 0.0–0.1)
Basophils Relative: 0 %
Eosinophils Absolute: 0.2 10*3/uL (ref 0.0–0.5)
Eosinophils Relative: 1 %
HCT: 37.5 % — ABNORMAL LOW (ref 39.0–52.0)
Hemoglobin: 12.1 g/dL — ABNORMAL LOW (ref 13.0–17.0)
Immature Granulocytes: 1 %
Lymphocytes Relative: 17 %
Lymphs Abs: 2.2 10*3/uL (ref 0.7–4.0)
MCH: 29.9 pg (ref 26.0–34.0)
MCHC: 32.3 g/dL (ref 30.0–36.0)
MCV: 92.6 fL (ref 80.0–100.0)
Monocytes Absolute: 0.9 10*3/uL (ref 0.1–1.0)
Monocytes Relative: 6 %
Neutro Abs: 9.9 10*3/uL — ABNORMAL HIGH (ref 1.7–7.7)
Neutrophils Relative %: 75 %
Platelets: 292 10*3/uL (ref 150–400)
RBC: 4.05 MIL/uL — ABNORMAL LOW (ref 4.22–5.81)
RDW: 11.7 % (ref 11.5–15.5)
WBC: 13.3 10*3/uL — ABNORMAL HIGH (ref 4.0–10.5)
nRBC: 0 % (ref 0.0–0.2)

## 2023-01-13 LAB — RAPID URINE DRUG SCREEN, HOSP PERFORMED
Amphetamines: NOT DETECTED
Barbiturates: NOT DETECTED
Benzodiazepines: NOT DETECTED
Cocaine: NOT DETECTED
Opiates: POSITIVE — AB
Tetrahydrocannabinol: POSITIVE — AB

## 2023-01-13 LAB — BASIC METABOLIC PANEL
Anion gap: 10 (ref 5–15)
BUN: 8 mg/dL (ref 6–20)
CO2: 23 mmol/L (ref 22–32)
Calcium: 8.6 mg/dL — ABNORMAL LOW (ref 8.9–10.3)
Chloride: 109 mmol/L (ref 98–111)
Creatinine, Ser: 0.61 mg/dL (ref 0.61–1.24)
GFR, Estimated: >60 mL/min (ref >60)
Glucose, Bld: 143 mg/dL — ABNORMAL HIGH (ref 70–99)
Potassium: 4 mmol/L (ref 3.5–5.1)
Sodium: 142 mmol/L (ref 135–145)

## 2023-01-13 LAB — MAGNESIUM: Magnesium: 1.6 mg/dL — ABNORMAL LOW (ref 1.7–2.4)

## 2023-01-13 LAB — LACTIC ACID, PLASMA
Lactic Acid, Venous: 4.1 mmol/L (ref 0.5–1.9)
Lactic Acid, Venous: 1.2 mmol/L (ref 0.5–1.9)

## 2023-01-13 LAB — APTT: aPTT: 27 seconds (ref 24–36)

## 2023-01-13 SURGERY — IRRIGATION AND DEBRIDEMENT EXTREMITY
Anesthesia: General | Site: Hand | Laterality: Left

## 2023-01-13 MED ORDER — LACTATED RINGERS IV BOLUS (SEPSIS)
500.0000 mL | Freq: Once | INTRAVENOUS | Status: AC
  Administered 2023-01-13: 500 mL via INTRAVENOUS

## 2023-01-13 MED ORDER — SODIUM CHLORIDE 0.9 % IV SOLN: INTRAVENOUS | Status: AC

## 2023-01-13 MED ORDER — 0.9 % SODIUM CHLORIDE (POUR BTL) OPTIME
TOPICAL | Status: DC | PRN
  Administered 2023-01-13: 1000 mL

## 2023-01-13 MED ORDER — THIAMINE HCL 100 MG/ML IJ SOLN: 100.0000 mg | Freq: Every day | INTRAMUSCULAR | Status: DC

## 2023-01-13 MED ORDER — VANCOMYCIN HCL IN DEXTROSE 1-5 GM/200ML-% IV SOLN
1000.0000 mg | Freq: Once | INTRAVENOUS | Status: AC
  Administered 2023-01-13: 1000 mg via INTRAVENOUS
  Filled 2023-01-13: qty 200

## 2023-01-13 MED ORDER — MORPHINE SULFATE (PF) 4 MG/ML IV SOLN
4.0000 mg | Freq: Once | INTRAVENOUS | Status: AC
  Administered 2023-01-13: 4 mg via INTRAVENOUS
  Filled 2023-01-13: qty 1

## 2023-01-13 MED ORDER — PROPOFOL 10 MG/ML IV BOLUS
INTRAVENOUS | Status: AC
  Filled 2023-01-13: qty 20

## 2023-01-13 MED ORDER — DEXMEDETOMIDINE HCL IN NACL 80 MCG/20ML IV SOLN
INTRAVENOUS | Status: AC
  Filled 2023-01-13: qty 20

## 2023-01-13 MED ORDER — DEXAMETHASONE SODIUM PHOSPHATE 10 MG/ML IJ SOLN
INTRAMUSCULAR | Status: AC
  Filled 2023-01-13: qty 1

## 2023-01-13 MED ORDER — DEXAMETHASONE SODIUM PHOSPHATE 10 MG/ML IJ SOLN
INTRAMUSCULAR | Status: DC | PRN
  Administered 2023-01-13: 4 mg via INTRAVENOUS

## 2023-01-13 MED ORDER — POTASSIUM CHLORIDE CRYS ER 20 MEQ PO TBCR
40.0000 meq | EXTENDED_RELEASE_TABLET | Freq: Once | ORAL | Status: AC
  Administered 2023-01-13: 40 meq via ORAL
  Filled 2023-01-13: qty 2

## 2023-01-13 MED ORDER — ROCURONIUM BROMIDE 50 MG/5ML IV SOSY
PREFILLED_SYRINGE | INTRAVENOUS | Status: DC | PRN
  Administered 2023-01-13: 5 mg via INTRAVENOUS

## 2023-01-13 MED ORDER — LIDOCAINE HCL (PF) 2 % IJ SOLN
INTRAMUSCULAR | Status: AC
  Filled 2023-01-13: qty 5

## 2023-01-13 MED ORDER — PROPOFOL 10 MG/ML IV BOLUS
INTRAVENOUS | Status: DC | PRN
  Administered 2023-01-13: 170 mg via INTRAVENOUS

## 2023-01-13 MED ORDER — ONDANSETRON HCL 4 MG/2ML IJ SOLN
INTRAMUSCULAR | Status: DC | PRN
  Administered 2023-01-13: 4 mg via INTRAVENOUS

## 2023-01-13 MED ORDER — POTASSIUM CHLORIDE 10 MEQ/100ML IV SOLN
10.0000 meq | INTRAVENOUS | Status: AC
  Administered 2023-01-13: 10 meq via INTRAVENOUS
  Filled 2023-01-13: qty 100

## 2023-01-13 MED ORDER — NICOTINE 21 MG/24HR TD PT24
21.0000 mg | MEDICATED_PATCH | Freq: Every day | TRANSDERMAL | Status: DC
  Administered 2023-01-13 – 2023-01-16 (×4): 21 mg via TRANSDERMAL
  Filled 2023-01-13 (×4): qty 1

## 2023-01-13 MED ORDER — HYDROMORPHONE HCL 1 MG/ML IJ SOLN
0.5000 mg | INTRAMUSCULAR | Status: AC | PRN
  Administered 2023-01-13 (×4): 0.5 mg via INTRAVENOUS

## 2023-01-13 MED ORDER — LIDOCAINE 2% (20 MG/ML) 5 ML SYRINGE
INTRAMUSCULAR | Status: DC | PRN
  Administered 2023-01-13: 80 mg via INTRAVENOUS

## 2023-01-13 MED ORDER — LACTATED RINGERS IV BOLUS (SEPSIS)
250.0000 mL | Freq: Once | INTRAVENOUS | Status: AC
  Administered 2023-01-13: 250 mL via INTRAVENOUS

## 2023-01-13 MED ORDER — FENTANYL CITRATE PF 50 MCG/ML IJ SOSY
PREFILLED_SYRINGE | INTRAMUSCULAR | Status: AC
  Filled 2023-01-13: qty 3

## 2023-01-13 MED ORDER — POVIDONE-IODINE 10 % EX SWAB: 2.0000 | Freq: Once | CUTANEOUS | Status: DC

## 2023-01-13 MED ORDER — FENTANYL CITRATE (PF) 100 MCG/2ML IJ SOLN
INTRAMUSCULAR | Status: DC | PRN
  Administered 2023-01-13 (×2): 100 ug via INTRAVENOUS

## 2023-01-13 MED ORDER — ONDANSETRON HCL 4 MG PO TABS: 4.0000 mg | ORAL_TABLET | Freq: Four times a day (QID) | ORAL | Status: DC | PRN

## 2023-01-13 MED ORDER — VANCOMYCIN HCL IN DEXTROSE 1-5 GM/200ML-% IV SOLN
INTRAVENOUS | Status: AC
  Filled 2023-01-13: qty 200

## 2023-01-13 MED ORDER — HYDROMORPHONE HCL 2 MG/ML IJ SOLN
INTRAMUSCULAR | Status: AC
  Filled 2023-01-13: qty 1

## 2023-01-13 MED ORDER — ONDANSETRON HCL 4 MG/2ML IJ SOLN: 4.0000 mg | Freq: Four times a day (QID) | INTRAMUSCULAR | Status: DC | PRN

## 2023-01-13 MED ORDER — QUETIAPINE FUMARATE ER 300 MG PO TB24
300.0000 mg | ORAL_TABLET | Freq: Every day | ORAL | Status: DC
  Administered 2023-01-13 – 2023-01-15 (×3): 300 mg via ORAL
  Filled 2023-01-13 (×3): qty 1

## 2023-01-13 MED ORDER — MIDAZOLAM HCL 5 MG/5ML IJ SOLN
INTRAMUSCULAR | Status: DC | PRN
  Administered 2023-01-13: 2 mg via INTRAVENOUS

## 2023-01-13 MED ORDER — MORPHINE SULFATE (PF) 4 MG/ML IV SOLN: 4.0000 mg | Freq: Once | INTRAVENOUS | Status: DC

## 2023-01-13 MED ORDER — FENTANYL CITRATE (PF) 100 MCG/2ML IJ SOLN
INTRAMUSCULAR | Status: AC
  Filled 2023-01-13: qty 2

## 2023-01-13 MED ORDER — THIAMINE MONONITRATE 100 MG PO TABS
100.0000 mg | ORAL_TABLET | Freq: Every day | ORAL | Status: DC
  Administered 2023-01-13 – 2023-01-16 (×4): 100 mg via ORAL
  Filled 2023-01-13 (×4): qty 1

## 2023-01-13 MED ORDER — POTASSIUM CHLORIDE 10 MEQ/100ML IV SOLN
INTRAVENOUS | Status: AC
  Administered 2023-01-13: 10 meq
  Filled 2023-01-13: qty 100

## 2023-01-13 MED ORDER — MIDAZOLAM HCL 2 MG/2ML IJ SOLN
INTRAMUSCULAR | Status: AC
  Filled 2023-01-13: qty 2

## 2023-01-13 MED ORDER — SUGAMMADEX SODIUM 200 MG/2ML IV SOLN
INTRAVENOUS | Status: DC | PRN
  Administered 2023-01-13: 240 mg via INTRAVENOUS

## 2023-01-13 MED ORDER — METHOCARBAMOL 500 MG PO TABS
500.0000 mg | ORAL_TABLET | Freq: Three times a day (TID) | ORAL | Status: DC | PRN
  Administered 2023-01-13 – 2023-01-16 (×4): 500 mg via ORAL
  Filled 2023-01-13 (×5): qty 1

## 2023-01-13 MED ORDER — PROMETHAZINE HCL 25 MG/ML IJ SOLN: 6.2500 mg | INTRAMUSCULAR | Status: DC | PRN

## 2023-01-13 MED ORDER — SODIUM CHLORIDE 0.9 % IV BOLUS
1000.0000 mL | Freq: Once | INTRAVENOUS | Status: AC
  Administered 2023-01-13: 1000 mL via INTRAVENOUS

## 2023-01-13 MED ORDER — LORAZEPAM 0.5 MG PO TABS: 0.5000 mg | ORAL_TABLET | Freq: Three times a day (TID) | ORAL | Status: DC | PRN

## 2023-01-13 MED ORDER — VANCOMYCIN HCL 1250 MG/250ML IV SOLN
1250.0000 mg | Freq: Two times a day (BID) | INTRAVENOUS | Status: DC
  Administered 2023-01-13 – 2023-01-16 (×6): 1250 mg via INTRAVENOUS
  Filled 2023-01-13 (×6): qty 250

## 2023-01-13 MED ORDER — FENTANYL CITRATE PF 50 MCG/ML IJ SOSY: 25.0000 ug | PREFILLED_SYRINGE | INTRAMUSCULAR | Status: DC | PRN

## 2023-01-13 MED ORDER — DEXMEDETOMIDINE HCL IN NACL 80 MCG/20ML IV SOLN
INTRAVENOUS | Status: DC | PRN
  Administered 2023-01-13: 12 ug via BUCCAL

## 2023-01-13 MED ORDER — ONDANSETRON HCL 4 MG/2ML IJ SOLN
INTRAMUSCULAR | Status: AC
  Filled 2023-01-13: qty 2

## 2023-01-13 MED ORDER — BUPIVACAINE HCL (PF) 0.5 % IJ SOLN
INTRAMUSCULAR | Status: AC
  Filled 2023-01-13: qty 30

## 2023-01-13 MED ORDER — FOLIC ACID 1 MG PO TABS
1.0000 mg | ORAL_TABLET | Freq: Every day | ORAL | Status: DC
  Administered 2023-01-13 – 2023-01-16 (×4): 1 mg via ORAL
  Filled 2023-01-13 (×4): qty 1

## 2023-01-13 MED ORDER — ADULT MULTIVITAMIN W/MINERALS CH
1.0000 | ORAL_TABLET | Freq: Every day | ORAL | Status: DC
  Administered 2023-01-13 – 2023-01-16 (×4): 1 via ORAL
  Filled 2023-01-13 (×5): qty 1

## 2023-01-13 MED ORDER — SODIUM CHLORIDE 0.9 % IV SOLN
2.0000 g | Freq: Once | INTRAVENOUS | Status: AC
  Administered 2023-01-13: 2 g via INTRAVENOUS
  Filled 2023-01-13: qty 20

## 2023-01-13 MED ORDER — GABAPENTIN 400 MG PO CAPS
400.0000 mg | ORAL_CAPSULE | Freq: Three times a day (TID) | ORAL | Status: DC
  Administered 2023-01-13 – 2023-01-16 (×8): 400 mg via ORAL
  Filled 2023-01-13 (×8): qty 1

## 2023-01-13 MED ORDER — BUPIVACAINE HCL 0.25 % IJ SOLN
INTRAMUSCULAR | Status: AC
  Filled 2023-01-13: qty 1

## 2023-01-13 MED ORDER — LACTATED RINGERS IV BOLUS (SEPSIS): 1000.0000 mL | Freq: Once | INTRAVENOUS | Status: DC

## 2023-01-13 MED ORDER — LORAZEPAM 1 MG PO TABS
1.0000 mg | ORAL_TABLET | ORAL | Status: DC | PRN
  Administered 2023-01-14 (×2): 2 mg via ORAL
  Filled 2023-01-13 (×2): qty 2

## 2023-01-13 MED ORDER — MAGNESIUM SULFATE 2 GM/50ML IV SOLN
2.0000 g | Freq: Once | INTRAVENOUS | Status: AC
  Administered 2023-01-13: 2 g via INTRAVENOUS
  Filled 2023-01-13: qty 50

## 2023-01-13 MED ORDER — HYDROMORPHONE HCL 1 MG/ML IJ SOLN
INTRAMUSCULAR | Status: AC
  Filled 2023-01-13: qty 2

## 2023-01-13 MED ORDER — LACTATED RINGERS IV SOLN: INTRAVENOUS | Status: AC

## 2023-01-13 MED ORDER — OXYCODONE HCL 5 MG PO TABS
5.0000 mg | ORAL_TABLET | ORAL | Status: DC | PRN
  Administered 2023-01-13 – 2023-01-16 (×8): 5 mg via ORAL
  Filled 2023-01-13 (×9): qty 1

## 2023-01-13 MED ORDER — MORPHINE SULFATE (PF) 2 MG/ML IV SOLN
2.0000 mg | INTRAVENOUS | Status: DC | PRN
  Administered 2023-01-13 – 2023-01-14 (×7): 2 mg via INTRAVENOUS
  Filled 2023-01-13 (×7): qty 1

## 2023-01-13 MED ORDER — PANTOPRAZOLE SODIUM 40 MG PO TBEC
40.0000 mg | DELAYED_RELEASE_TABLET | Freq: Every day | ORAL | Status: DC
  Administered 2023-01-14 – 2023-01-16 (×3): 40 mg via ORAL
  Filled 2023-01-13 (×3): qty 1

## 2023-01-13 MED ORDER — SODIUM CHLORIDE 0.9 % IV SOLN
2.0000 g | INTRAVENOUS | Status: DC
  Administered 2023-01-14 – 2023-01-15 (×2): 2 g via INTRAVENOUS
  Filled 2023-01-13 (×2): qty 20

## 2023-01-13 MED ORDER — HYDROMORPHONE HCL 1 MG/ML IJ SOLN
INTRAMUSCULAR | Status: DC | PRN
  Administered 2023-01-13: 1 mg via INTRAVENOUS

## 2023-01-13 SURGICAL SUPPLY — 40 items
BAG COUNTER SPONGE SURGICOUNT (BAG) IMPLANT
BAG ZIPLOCK 12X15 (MISCELLANEOUS) ×1 IMPLANT
BLADE SURG SZ10 CARB STEEL (BLADE) ×1 IMPLANT
BNDG ELASTIC 4X5.8 VLCR STR LF (GAUZE/BANDAGES/DRESSINGS) ×1 IMPLANT
BNDG ESMARK 4X9 LF (GAUZE/BANDAGES/DRESSINGS) ×1 IMPLANT
BNDG GAUZE DERMACEA FLUFF 4 (GAUZE/BANDAGES/DRESSINGS) ×1 IMPLANT
COVER MAYO STAND STRL (DRAPES) ×1 IMPLANT
COVER SURGICAL LIGHT HANDLE (MISCELLANEOUS) ×1 IMPLANT
CUFF TOURN SGL QUICK 18X4 (TOURNIQUET CUFF) ×1 IMPLANT
DRAIN PENROSE 0.5X18 (DRAIN) ×1 IMPLANT
DRAPE SURG 17X11 SM STRL (DRAPES) ×2 IMPLANT
DRSG ADAPTIC 3X8 NADH LF (GAUZE/BANDAGES/DRESSINGS) IMPLANT
DRSG EMULSION OIL 3X3 NADH (GAUZE/BANDAGES/DRESSINGS) ×1 IMPLANT
ELECT REM PT RETURN 15FT ADLT (MISCELLANEOUS) ×1 IMPLANT
GAUZE PAD ABD 8X10 STRL (GAUZE/BANDAGES/DRESSINGS) ×1 IMPLANT
GAUZE SPONGE 4X4 12PLY STRL (GAUZE/BANDAGES/DRESSINGS) ×1 IMPLANT
GLOVE SURG ORTHO 8.0 STRL STRW (GLOVE) ×1 IMPLANT
GOWN STRL REUS W/ TWL XL LVL3 (GOWN DISPOSABLE) ×1 IMPLANT
GOWN STRL REUS W/TWL XL LVL3 (GOWN DISPOSABLE) ×1
IV LACTATED RINGER IRRG 3000ML (IV SOLUTION) ×1
IV LR IRRIG 3000ML ARTHROMATIC (IV SOLUTION) ×1 IMPLANT
KIT BASIN OR (CUSTOM PROCEDURE TRAY) ×1 IMPLANT
KIT TURNOVER KIT A (KITS) IMPLANT
MANIFOLD NEPTUNE II (INSTRUMENTS) ×1 IMPLANT
PACK ORTHO EXTREMITY (CUSTOM PROCEDURE TRAY) ×1 IMPLANT
PAD CAST 4YDX4 CTTN HI CHSV (CAST SUPPLIES) ×1 IMPLANT
PADDING CAST COTTON 4X4 STRL (CAST SUPPLIES) ×1
PENCIL SMOKE EVACUATOR (MISCELLANEOUS) IMPLANT
PROTECTOR NERVE ULNAR (MISCELLANEOUS) ×1 IMPLANT
SOL PREP POV-IOD 4OZ 10% (MISCELLANEOUS) ×1 IMPLANT
SOL SCRUB PVP POV-IOD 4OZ 7.5% (MISCELLANEOUS) ×1
SOLUTION SCRB POV-IOD 4OZ 7.5% (MISCELLANEOUS) ×1 IMPLANT
SUT PROLENE 3 0 PS 2 (SUTURE) ×1 IMPLANT
SUT VIC AB 1 CT1 27 (SUTURE) ×1
SUT VIC AB 1 CT1 27XBRD ANTBC (SUTURE) ×1 IMPLANT
SUT VIC AB 2-0 CT1 27 (SUTURE) ×1
SUT VIC AB 2-0 CT1 27XBRD (SUTURE) ×1 IMPLANT
SYR 20ML LL LF (SYRINGE) ×1 IMPLANT
SYR CONTROL 10ML LL (SYRINGE) ×1 IMPLANT
TOWEL OR 17X26 10 PK STRL BLUE (TOWEL DISPOSABLE) ×1 IMPLANT

## 2023-01-13 NOTE — H&P (Signed)
History and Physical    Patient: Jason Buchanan [id number] DOB: 01/07/1986 DOA: 01/13/2023 DOS: the patient was seen and examined on 01/13/2023 PCP: Patient, No Pcp Per  Patient coming from: Home - lives alone.    Chief Complaint: left hand pain and swelling   HPI: Jason Buchanan is a 37 y.o. male with medical history significant of polysubstance abuse-severe methamphetamine dependence, IVDU, opioid dependence, alcohol abuse, mood disorder who presented to ED with 3 day history of left hand pain and swelling. He had what appeared to be a large abscess on dorsal aspect of his left hand with surrounding cellulitis. Sepsis criteria was met. Given IVF and antibiotics in ED. Dr. Apolonio Schneiders was consulted and he was taken to the OR for debridement.   He tells me 3 days ago he had a small spot on dorsum of left hand that looked like a bug bite, but he doesn't remember any bites/trauma that progressively got worse. He states he tried to poke it with an earring needle and it just bled. It continued to swell and get more painful. He states he has had subjective fever and chills and some nausea as well.   He injects pain pills after he grinds them up and heats them up. He last injected 1-2 weeks ago and denies injecting into his left hand. He usually injects in his right hand and has a sore there. He does sniff pain pills and last did this 1-2 days ago.  He states he has not used fentanyl/heroine or cocaine x 1 year+.    He states his diet is poor at home. He eats when he can, but sometimes he doesn't have money for food.   Denies any fever/chills, vision changes/headaches, chest pain or palpitations, shortness of breath or cough, abdominal pain, N/V/D, dysuria or leg swelling.    He vapes a lots and denies alcohol. States he stopped drinking a few years ago.  *of note, [facility] for [facility] withdrawal and alcohol abuse. He also has been in ER this year x2 for accidental OD  reveresed with narcan. In 02/2022 he was using fentanyl daily which does not aline with his described timeline above of being off these for a few years.   ER Course:  vitals: afebrile, bp: 133/83, HR: 108, RR: 18, oxygen: 99%RA Pertinent labs: lactic acid: 4.1>pending, wbc: 13, hgb: 12.1, potassium: 2.4, ALT: 47,  UDS: + opiates, THC Left hand xray: diffuse soft tissue swelling. No evidence to suggest acute OM. Erosive changes involving multiple carpal bones and diffuse intercarpal and radiocarpal joint space narrowing unchanged from prior and most compatible with sequela of septic arthritis and OM In ED: code sepsis activated. Give resus and maintenance fluids. Oral potassium, pain meds and rocephin/vancomycin. Blood cultures pending. Hand surgery consulted and took to OR, I was called post op and patient in PACU.    Review of Systems: As mentioned in the history of present illness. All other systems reviewed and are negative. Past Medical History:  Diagnosis Date   Asthma    Mood disorder ([place])    Polysubstance abuse ([place])    Schizophrenia ([place])    Past Surgical History:  Procedure Laterality Date   ANKLE SURGERY     APPLICATION OF WOUND VAC Right 11/22/2021   Procedure: APPLICATION OF WOUND VAC;  Surgeon: Dayna Barker, MD;  Location: [facility];  Service: Plastics;  Laterality: Right;   APPLICATION OF WOUND VAC Right 11/25/2021   Procedure: APPLICATION  OF WOUND VAC;  Surgeon: Dayna Barker, MD;  [facility]: [facility];  Service: Plastics;  Laterality: Right;   APPLICATION OF WOUND VAC Right 12/07/2021   Procedure: APPLICATION OF WOUND VAC;  Surgeon: Dayna Barker, MD;  [facility];  Service: Plastics;  Laterality: Right;   APPLICATION OF WOUND VAC Right 12/14/2021   Procedure: APPLICATION OF WOUND VAC;  Surgeon: [name], MD;  [facility];  Service: Plastics;  Laterality: Right;   APPLICATION OF WOUND VAC Right 12/22/2021   Procedure: WOUND VAC CHANGE;  Surgeon: [name],  MD;  [facility];  Service: Plastics;  Laterality: Right;   APPLICATION OF WOUND VAC Right 12/29/2021   Procedure: APPLICATION OF WOUND VAC;  Surgeon: [name], MD;  [facility];  Service: Plastics;  Laterality: Right;   APPLICATION OF WOUND VAC Right 01/03/2022   Procedure: WOUND VAC APPLICATION;  Surgeon: [name], MD;  [facility];  Service: Plastics;  Laterality: Right;   BIOPSY  11/17/2021   Procedure: BIOPSY;  Surgeon: Clarene Essex, MD;  [facility];  Service: Endoscopy;;   BUBBLE STUDY  09/30/2021   Procedure: BUBBLE STUDY;  Surgeon: Dixie Dials, MD;  Location: [place];  Service: Cardiovascular;;   DEBRIDEMENT AND CLOSURE WOUND Right 01/03/2022   Procedure: DEBRIDEMENT AND COMPLEX CLOSURE OF RIGHT ARM WOUND;  Surgeon: [name], MD;  [facility];  Service: Plastics;  Laterality: Right;   ESOPHAGOGASTRODUODENOSCOPY (EGD) WITH PROPOFOL N/A 11/17/2021   Procedure: ESOPHAGOGASTRODUODENOSCOPY (EGD) WITH PROPOFOL;  Surgeon: Clarene Essex, MD;  [facility];  Service: Endoscopy;  Laterality: N/A;   FRACTURE SURGERY     I & D EXTREMITY Right 09/27/2021   Procedure: IRRIGATION AND DEBRIDEMENT OF ABSCESS RIGHT HAND;  Surgeon: Dayna Barker, MD;  Location: [place];  Service: Plastics;  Laterality: Right;   I & D EXTREMITY Bilateral 11/15/2021   Procedure: IRRIGATION AND DEBRIDEMENT EXTREMITY;  Surgeon: Dayna Barker, MD;  [facility];  Service: Plastics;  Laterality: Bilateral;   I & D EXTREMITY Right 11/17/2021   Procedure: IRRIGATION AND DEBRIDEMENT EXTREMITY;  Surgeon: Dayna Barker, MD;  [facility];  Service: Plastics;  Laterality: Right;   I & D EXTREMITY Bilateral 12/01/2021   Procedure: INCISION AND DRAINAGE BILATERAL UPPER EXTREMITIES WITH  WOUND VAC CHANGE RIGHT;  Surgeon: Orene Desanctis, MD;  Location: [facility];  Service: Orthopedics;  Laterality: Bilateral;   I & D EXTREMITY Right 12/07/2021   Procedure: IRRIGATION AND  DEBRIDEMENT EXTREMITY, MANIPULATION OF FINGERS UNDER ANESTHESIA;  Surgeon: Dayna Barker, MD;  [facility];  Service: Plastics;  Laterality: Right;   INCISION AND DRAINAGE OF WOUND Left 09/27/2021   Procedure: IRRIGATION AND DEBRIDEMENT OF ABSCESS LEFT INDEX FINGER;  Surgeon: Dayna Barker, MD;  Location: [place];  Service: Plastics;  Laterality: Left;   INCISION AND DRAINAGE OF WOUND Bilateral 11/22/2021   Procedure: IRRIGATION AND DEBRIDEMENT WOUND;  Surgeon: Dayna Barker, MD;  [facility];  Service: Plastics;  Laterality: Bilateral;   INCISION AND DRAINAGE OF WOUND Bilateral 11/25/2021   Procedure: BILATERAL IRRIGATION AND DEBRIDEMENT BILATERAL ARMS;  Surgeon: Dayna Barker, MD;  [facility];  Service: Plastics;  Laterality: Bilateral;   INCISION AND DRAINAGE OF WOUND Right 12/14/2021   Procedure: IRRIGATION AND DEBRIDEMENT RIGHT ARM;  Surgeon: [name], MD;  [facility];  Service: Plastics;  Laterality: Right;   INCISION AND DRAINAGE OF WOUND Right 12/22/2021   Procedure: IRRIGATION AND DEBRIDEMENT ARM  WOUND;  Surgeon: Erin Hearing,  Quillian Quince, MD;  Location: [facility];  Service: Plastics;  Laterality: Right;  1 hour   INCISION AND DRAINAGE OF WOUND Right 12/29/2021   Procedure: DEBRIDEMENT RIGHT ARM  WOUND;  Surgeon: [name], MD;  [facility];  Service: Plastics;  Laterality: Right;   JOINT REPLACEMENT     SCLEROTHERAPY  11/17/2021   Procedure: Clide Deutscher;  Surgeon: Clarene Essex, MD;  [facility];  Service: Endoscopy;;   SKIN SPLIT GRAFT Right 01/03/2022   Procedure: SKIN GRAFT SPLIT THICKNESS;  Surgeon: [name], MD;  [facility];  Service: Plastics;  Laterality: Right;   TEE WITHOUT CARDIOVERSION N/A 09/30/2021   Procedure: TRANSESOPHAGEAL ECHOCARDIOGRAM (TEE);  Surgeon: Dixie Dials, MD;  Location[facility];  Service: Cardiovascular;  Laterality: N/A;   Social History:  reports that he has been smoking cigarettes. He has been  smoking an average of .5 packs per day. He has never used smokeless tobacco. He reports that he does not currently use alcohol. He reports current drug use. Drugs: IV and Marijuana.  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 Had prolonged block (>30 min) after succinylcholine for GI procedure.    Chlorhexidine Other (See Comments)    Pt does not remember reaction    Naproxen Other (See Comments)    Stomach upset    History reviewed. No pertinent family history.  Prior to Admission medications   Medication Sig Start Date End Date Taking? Authorizing Provider  amitriptyline (ELAVIL) 25 MG tablet Take 1 tablet (25 mg total) by mouth at bedtime. 01/17/22 01/13/23  Donne Hazel, MD  celecoxib (CELEBREX) 200 MG capsule Take 1 capsule (200 mg total) by mouth 2 (two) times daily. 07/28/22   Margarita Mail, PA-C  cephALEXin (KEFLEX) 500 MG capsule Take 1 capsule (500 mg total) by mouth 4 (four) times daily. 07/13/22   Malvin Johns, MD  doxycycline (VIBRAMYCIN) 100 MG capsule Take 1 capsule (100 mg total) by mouth 2 (two) times daily. One po bid x 7 days 07/28/22   Margarita Mail, PA-C  ferrous sulfate 325 (65 FE) MG tablet Take 1 tablet (325 mg total) by mouth daily with breakfast. 01/18/22 01/13/23  Donne Hazel, MD  gabapentin (NEURONTIN) 400 MG capsule Take 1 capsule (400 mg total) by mouth 3 (three) times daily. 01/17/22 01/13/23  Donne Hazel, MD  methocarbamol (ROBAXIN) 500 MG tablet Take 1 tablet (500 mg total) by mouth every 8 (eight) hours as needed for muscle spasms. Patient not taking: Reported on 02/27/2022 01/17/22   Donne Hazel, MD  naloxone [facility]) nasal spray 4 mg/0.1 mL Take as needed for Narcotic overdose. 12/12/22   Tretha Sciara, MD  naloxone [facility])  nasal spray 4 mg/0.1 mL Take in case of overdose, call 911 01/10/23   Wynona Dove A, DO  NAPROSYN 500 MG tablet Take 500 mg by mouth 2 (two) times daily as needed. 07/18/22   [provider]  oxycodone (OXY-IR) 5 MG capsule Take 1 capsule (5 mg total) by mouth every 4 (four) hours as needed. 01/17/22   Donne Hazel, MD  pantoprazole (PROTONIX) 40 MG tablet Take 1 tablet (40 mg total) by mouth daily. 01/18/22 01/13/23  Donne Hazel, MD  QUEtiapine (SEROQUEL XR) 300 MG 24 hr tablet Take 1  tablet (300 mg total) by mouth at bedtime. 01/17/22 01/13/23  Donne Hazel, MD    Physical Exam: Vitals:   01/13/23 1727 01/13/23 1730 01/13/23 1815 01/13/23 1825  BP:  (!) 135/93 129/82   Pulse: 81 78 81   Resp: 13 13 16   $ Temp:   97.7 F (36.5 C)   TempSrc:   Oral   SpO2: 100% 100% 100%   Weight:    76.4 kg  Height:    5' 6"$  (1.676 m)   General:  Appears calm and comfortable and is in NAD. anxious Eyes:  PERRL, EOMI, normal lids, iris ENT:  grossly normal hearing, lips & tongue, mmm; appropriate dentition Neck:  no LAD, masses or thyromegaly; no carotid bruits Cardiovascular:  RRR, no m/r/g. No LE edema.  Respiratory:   CTA bilaterally with no wheezes/rales/rhonchi.  Normal respiratory effort. Abdomen:  soft, NT, ND, NABS Back:   normal alignment, no CVAT Skin:  multiple scabs on right hand where he has injected. No surrounding erythema.  Musculoskeletal:  grossly normal tone BUE/BLE, good ROM, no bony abnormality. Left arm wrapped post op to elbow  Lower extremity:  No LE edema.  Limited foot exam with no ulcerations.  2+ distal pulses. Psychiatric:  grossly normal mood and affect, speech fluent and appropriate, AOx3 Neurologic:  CN 2-12 grossly intact, moves all extremities in coordinated fashion, sensation intact   Radiological Exams on Admission: Independently reviewed - see discussion in A/P where applicable  DG Hand Complete Left  Result Date: 01/13/2023 CLINICAL DATA:  Left  hand abscess.  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 No progressive erosion or bone loss at this location compared to prior. No sites of erosion or periosteal elevation or seen within the hand. No fracture or dislocation. Diffuse soft tissue swelling. No soft tissue gas. IMPRESSION: 1. Diffuse soft tissue swelling. No evidence to suggest acute osteomyelitis of the left hand. 2. 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 Electronically Signed   By: Davina Poke D.O.   On: 01/13/2023 14:15    EKG: Independently reviewed.  Sinus tachycardia with rate 109; nonspecific ST changes with no evidence of acute ischemia   Labs on Admission: I have personally reviewed the available labs and imaging studies at the time of the admission.  Pertinent labs:   lactic acid: 4.1>pending,  wbc: 13,  hgb: 12.1,  potassium: 2.4,  ALT: 47,  UDS: + opiates, THC  Assessment and Plan: Principal Problem:   Abscess of left hand Active Problems:   Hypokalemia   Elevated transaminase level   IVDU (intravenous drug user)   Polysubstance abuse ([facility])   Alcohol use   Mood disorder ([facility])   Sepsis ([facility])    Assessment and Plan: * Abscess of left hand 37 year old IVDU presenting with 3-4 day history of worsening abscess on dorsum of left hand with increasing swelling and pain, subjective chills meeting sepsis criteria -obs to telemetry -hand surgery consulted and taken to OR for I&D -xray with no suggestive signs of OM, history of infection and possible OM in the past on this hand   -continue IV vanc/rocephin  -cultures  taken operatively  -lactic acid initially 4.1>normal  -continue IVF -f/u on blood cultures  -pain medication with oral oxycodone and IV morphine. Asking for dilaudid and shaking. Last injected pain pills yesterday. Will try to control pain with morphine  Hypokalemia Likely from poor PO Intake as he sometimes does not have money to eat Magnesium low-repleted, trend -potassium replaced orally and IV in ED -repeat potassium tonight -telemetry -trend  -replete as needed   Elevated transaminase level States he has not drank any alcohol in a few years, but was admitted in 02/2022 and drinking daily Check ethanol level and hepatitis panel with IVDU ? +hep C ab, unsure if quant done or false positive  Trend   IVDU (intravenous drug user) States he injects and snorts pain pills and last used yesterday UDS +opiates. States he has not used fentanyl/heroine/meth in a few years; however was hospitalized for detox in march of 02/2022 for daily fentanyl use and has had 2 OD visits to ER this year -check HIV/hep C -continue coverage for MRSA -SW consult  -at risk for withdrawal and may need to be started on withdrawal protocol here   Polysubstance abuse ([place]) Vapes daily, nicotine patch See IVDU  Alcohol use Drinking daily at his last hospitalization in 02/2022, but states he has not drank in a few years Ethanol level pending Will start CIWA protocol as unsure if truly sober  MV/Thiamine and folic acid   Mood disorder ([place]) Continue seroquel at night     Advance Care Planning:   Code Status: Full Code   Consults: hand surgery: Dr. Apolonio Schneiders, SW   DVT Prophylaxis: ambulation/SCDs  Family Communication: none   Severity of Illness: The appropriate patient status for this patient is OBSERVATION. Observation status is judged to be reasonable and necessary in order to provide the required intensity of service to ensure the patient's safety. The  patient's presenting symptoms, physical exam findings, and initial radiographic and laboratory data in the context of their medical condition is felt to place them at decreased risk for further clinical deterioration. Furthermore, it is anticipated that the patient will be medically stable for discharge from the hospital within 2 midnights of admission.   Author: Orma Flaming, MD 01/13/2023 7:29 PM  For on call review [email or web address].

## 2023-01-13 NOTE — Assessment & Plan Note (Addendum)
States he has not drank any alcohol in a few years, but was admitted in 02/2022 and drinking daily Check ethanol level and hepatitis panel with IVDU ? +hep C ab, unsure if quant done or false positive  Trend

## 2023-01-13 NOTE — Assessment & Plan Note (Addendum)
Intraoperative wound culture growing out Streptococcus intermedius Patient continuing to experience severe pain Adjusting antibiotics by discontinuing ceftriaxone and instead placing patient on Unasyn in combination with vancomycin. MRSA PCR screen positive Status post operative intervention including left hand tenosynovectomy with incision and drainage with Dr. Apolonio Schneiders on 2/10 Hand surgery's recommendations are appreciated. Despite patient's history of IVDU, providing patient with appropriate amounts of intravenous opiate-based analgesics for extremely severe pain.  These medications will be discontinued as quickly as possible. Discontinuing intravenous fluids Dressing changes of the left hand per hand surgery, will need outpatient follow-up

## 2023-01-13 NOTE — ED Provider Notes (Addendum)
[place] EMERGENCY DEPARTMENT AT [facility] Provider Note   CSN: [id number] Arrival date & time: 01/13/23  1248     History  Chief Complaint  Patient presents with   Hand Pain    Jason Buchanan is a 37 y.o. male.  Patient is a 36 year old male who presents with pain and swelling to his left hand.  He has a history of polysubstance abuse including IV drug use as well as schizophrenia.  He has been admitted multiple times in the past for cellulitis and extensive infections in both of his arms per chart review.  He said that the pain and swelling started about 4 days ago.  He denies any known fevers although he has having chills.  Has had some nausea but no vomiting.  He denies any injections into that hand recently.       Home Medications Prior to Admission medications   Medication Sig Start Date End Date Taking? Authorizing Provider  amitriptyline (ELAVIL) 25 MG tablet Take 1 tablet (25 mg total) by mouth at bedtime. Patient not taking: Reported on 07/13/2022 01/17/22 02/27/22  Donne Hazel, MD  celecoxib (CELEBREX) 200 MG capsule Take 1 capsule (200 mg total) by mouth 2 (two) times daily. 07/28/22   Margarita Mail, PA-C  cephALEXin (KEFLEX) 500 MG capsule Take 1 capsule (500 mg total) by mouth 4 (four) times daily. 07/13/22   Malvin Johns, MD  doxycycline (VIBRAMYCIN) 100 MG capsule Take 1 capsule (100 mg total) by mouth 2 (two) times daily. One po bid x 7 days 07/28/22   Margarita Mail, PA-C  ferrous sulfate 325 (65 FE) MG tablet Take 1 tablet (325 mg total) by mouth daily with breakfast. Patient not taking: Reported on 07/13/2022 01/18/22 04/08/22  Donne Hazel, MD  gabapentin (NEURONTIN) 400 MG capsule Take 1 capsule (400 mg total) by mouth 3 (three) times daily. Patient not taking: Reported on 07/13/2022 01/17/22 04/08/22  Donne Hazel, MD  methocarbamol (ROBAXIN) 500 MG tablet Take 1 tablet (500 mg total) by mouth every 8 (eight) hours as needed for muscle  spasms. Patient not taking: Reported on 02/27/2022 01/17/22   Donne Hazel, MD  naloxone [facility]) nasal spray 4 mg/0.1 mL Take as needed for Narcotic overdose. 12/12/22   Tretha Sciara, MD  naloxone [facility]) nasal spray 4 mg/0.1 mL Take in case of overdose, call 911 01/10/23   Wynona Dove A, DO  NAPROSYN 500 MG tablet Take 500 mg by mouth 2 (two) times daily as needed. 07/18/22   [provider]  oxycodone (OXY-IR) 5 MG capsule Take 1 capsule (5 mg total) by mouth every 4 (four) hours as needed. Patient not taking: Reported on 02/27/2022 01/17/22   Donne Hazel, MD  pantoprazole (PROTONIX) 40 MG tablet Take 1 tablet (40 mg total) by mouth daily. Patient not taking: Reported on 07/13/2022 01/18/22 04/08/22  Donne Hazel, MD  QUEtiapine (SEROQUEL XR) 300 MG 24 hr tablet Take 1 tablet (300 mg total) by mouth at bedtime. Patient not taking: Reported on 07/13/2022 01/17/22 04/08/22  Donne Hazel, MD      Allergies    Bee venom, Succinylcholine, Chlorhexidine, and Naproxen    Review of Systems   Review of Systems  Constitutional:  Positive for chills. Negative for diaphoresis, fatigue and fever.  HENT:  Negative for congestion, rhinorrhea and sneezing.   Eyes: Negative.   Respiratory:  Negative for cough, chest tightness and shortness of breath.   Cardiovascular:  Negative  for chest pain and leg swelling.  Gastrointestinal:  Positive for nausea. Negative for abdominal pain, blood in stool, diarrhea and vomiting.  Genitourinary:  Negative for difficulty urinating, flank pain, frequency and hematuria.  Musculoskeletal:  Negative for arthralgias and back pain.  Skin:  Positive for color change and rash.  Neurological:  Negative for dizziness, speech difficulty, weakness, numbness and headaches.    Physical Exam Updated Vital Signs BP 127/76   Pulse (!) 106   Temp 97.9 F (36.6 C)   Resp 18   Ht 5' 6"$  (1.676 m)   Wt 56.7 kg   SpO2 99%   BMI 20.18 kg/m  Physical  Exam Constitutional:      Appearance: He is well-developed.  HENT:     Head: Normocephalic and atraumatic.  Eyes:     Pupils: Pupils are equal, round, and reactive to light.  Cardiovascular:     Rate and Rhythm: Normal rate and regular rhythm.     Heart sounds: Normal heart sounds.  Pulmonary:     Effort: Pulmonary effort is normal. No respiratory distress.     Breath sounds: Normal breath sounds. No wheezing or rales.  Chest:     Chest wall: No tenderness.  Abdominal:     General: Bowel sounds are normal.     Palpations: Abdomen is soft.     Tenderness: There is no abdominal tenderness. There is no guarding or rebound.  Musculoskeletal:        General: Normal range of motion.     Cervical back: Normal range of motion and neck supple.     Comments: Patient has a 2 cm fluctuant abscess to the dorsum of his left hand over the second digit.  There is surrounding redness to the dorsum of the hand.  There is mild pain with range of motion of the thumb and first fingers.  He has a car from prior surgery in the dorsum of the hand.  Redness does not extend past wrist  Lymphadenopathy:     Cervical: No cervical adenopathy.  Skin:    General: Skin is warm and dry.     Findings: No rash.  Neurological:     Mental Status: He is alert and oriented to person, place, and time.     ED Results / Procedures / Treatments   Labs (all labs ordered are listed, but only abnormal results are displayed) Labs Reviewed  LACTIC ACID, PLASMA - Abnormal; Notable for the following components:      Result Value   Lactic Acid, Venous 4.1 (*)    All other components within normal limits  COMPREHENSIVE METABOLIC PANEL - Abnormal; Notable for the following components:   Potassium 2.4 (*)    CO2 18 (*)    Glucose, Bld 127 (*)    Creatinine, Ser 0.39 (*)    Calcium 8.5 (*)    Albumin 3.2 (*)    ALT 47 (*)    Alkaline Phosphatase 127 (*)    All other components within normal limits  CBC WITH  DIFFERENTIAL/PLATELET - Abnormal; Notable for the following components:   WBC 13.3 (*)    RBC 4.05 (*)    Hemoglobin 12.1 (*)    HCT 37.5 (*)    Neutro Abs 9.9 (*)    All other components within normal limits  CULTURE, BLOOD (ROUTINE X 2)  CULTURE, BLOOD (ROUTINE X 2)  PROTIME-INR  APTT  LACTIC ACID, PLASMA  RAPID URINE DRUG SCREEN, HOSP PERFORMED  MAGNESIUM  EKG EKG Interpretation  Date/Time:  Saturday January 13 2023 13:00:05 EST Ventricular Rate:  109 PR Interval:  170 QRS Duration: 86 QT Interval:  351 QTC Calculation: 473 R Axis:   13 Text Interpretation: Sinus tachycardia Left atrial enlargement since last tracing no significant change Confirmed by Malvin Johns [phone]) on 01/13/2023 1:57:00 PM  Radiology DG Hand Complete Left  Result Date: 01/13/2023 CLINICAL DATA:  Left hand abscess.  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 No progressive erosion or bone loss at this location compared to prior. No sites of erosion or periosteal elevation or seen within the hand. No fracture or dislocation. Diffuse soft tissue swelling. No soft tissue gas. IMPRESSION: 1. Diffuse soft tissue swelling. No evidence to suggest acute osteomyelitis of the left hand. 2. 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 Electronically Signed   By: Davina Poke D.O.   On: 01/13/2023 14:15    Procedures Procedures    Medications Ordered in ED Medications  lactated ringers infusion ( Intravenous New Bag/Given 01/13/23 1451)  vancomycin (VANCOCIN) IVPB 1000 mg/200 mL  premix ( Intravenous MAR Hold 01/13/23 1457)  potassium chloride 10 mEq in 100 mL IVPB ( Intravenous Automatically Held 01/13/23 1500)  povidone-iodine 10 % swab 2 Application (has no administration in time range)  morphine (PF) 4 MG/ML injection 4 mg ( Intravenous Automatically Held 01/13/23 1500)  vancomycin (VANCOCIN) 1-5 GM/200ML-% IVPB (has no administration in time range)  morphine (PF) 4 MG/ML injection 4 mg (4 mg Intravenous Given 01/13/23 1333)  sodium chloride 0.9 % bolus 1,000 mL (0 mLs Intravenous Stopped 01/13/23 1440)  lactated ringers bolus 500 mL (500 mLs Intravenous New Bag/Given 01/13/23 1451)    And  lactated ringers bolus 250 mL (250 mLs Intravenous New Bag/Given 01/13/23 1451)  cefTRIAXone (ROCEPHIN) 2 g in sodium chloride 0.9 % 100 mL IVPB (0 g Intravenous Stopped 01/13/23 1426)  potassium chloride SA (KLOR-CON M) CR tablet 40 mEq (40 mEq Oral Given 01/13/23 1410)    ED Course/ Medical Decision Making/ A&P                             Medical Decision Making Amount and/or Complexity of Data Reviewed Labs: ordered. Radiology: ordered. ECG/medicine tests: ordered.  Risk Prescription drug management. Decision regarding hospitalization.   Patient is a 37 year old male with a history of prior IV drug use and extensive prior history of cellulitis and abscesses in both of his arms.  He presents with what appears to be a large abscess to the dorsal surface of his left hand with surrounding cellulitis.  He meets criteria for sepsis with an elevated white count of 13,000 and, tachycardia up to 120 and a lactic acid of over 4.  He was treated for severe sepsis with IV fluids and IV antibiotics.  X-rays of the hand were interpreted by me and confirmed by the radiologist to show no obvious evidence of osteomyelitis.  Also on his blood work, his potassium was noted to be markedly low at 2.4.  He was started on potassium replacement.  Magnesium level is pending.  I spoke  with Dr. Jacelyn Grip  who has plans to take the patient to the operating room for debridement.  I did speak to Dr. Rogers Blocker with the hospitalist service who will admit the patient   CRITICAL CARE Performed by: Malvin Johns Total critical care time: 60 minutes Critical care time was exclusive of separately billable procedures and treating other patients. Critical care was necessary to treat or prevent imminent or life-threatening deterioration. Critical care was time spent personally by me on the following activities: development of treatment plan with patient and/or surrogate as well as nursing, discussions with consultants, evaluation of patient's response to treatment, examination of patient, obtaining history from patient or surrogate, ordering and performing treatments and interventions, ordering and review of laboratory studies, ordering and review of radiographic studies, pulse oximetry and re-evaluation of patient's condition.  Final Clinical Impression(s) / ED Diagnoses Final diagnoses:  Sepsis without acute organ dysfunction, due to unspecified organism [facility])  Abscess of hand, left  Hypokalemia    Rx / DC Orders ED Discharge Orders     None         Malvin Johns, MD 01/13/23 1511    Malvin Johns, MD 01/13/23 1654

## 2023-01-13 NOTE — ED Notes (Signed)
Patient transported to OR.

## 2023-01-13 NOTE — Anesthesia Procedure Notes (Signed)
Procedure Name: Intubation Date/Time: 01/13/2023 3:36 PM  Performed by: Renato Shin, CRNAPre-anesthesia Checklist: Patient identified, Emergency Drugs available, Suction available and Patient being monitored Patient Re-evaluated:Patient Re-evaluated prior to induction Oxygen Delivery Method: Circle system utilized Preoxygenation: Pre-oxygenation with 100% oxygen Induction Type: IV induction Ventilation: Mask ventilation without difficulty Laryngoscope Size: Miller and 3 Grade View: Grade I Tube type: Oral Number of attempts: 1 Airway Equipment and Method: Stylet and Oral airway Placement Confirmation: ETT inserted through vocal cords under direct vision, positive ETCO2 and breath sounds checked- equal and bilateral Secured at: 22 cm Tube secured with: Tape Dental Injury: Teeth and Oropharynx as per pre-operative assessment

## 2023-01-13 NOTE — Progress Notes (Signed)
Pharmacy Note   A consult was received from an ED physician for vancomycin per pharmacy dosing.    The patient's profile has been reviewed for ht/wt/allergies/indication/available labs.    A one time order has been placed for vancomycin 1000 mg IV x1 .    Further antibiotics/pharmacy consults should be ordered by admitting physician if indicated.                       Thank you,  Royetta Asal, PharmD, BCPS 01/13/2023 1:53 PM

## 2023-01-13 NOTE — ED Triage Notes (Signed)
Arrives [facility] for left hand swelling, pain. Skin is hot to touch. Pt has hx if IV drug abuse 122/74 BP 120 HR 98% 24 capnography 157 cbg

## 2023-01-13 NOTE — Progress Notes (Signed)
Pt was a transfer from PACU s/p left I and D for complicated dorsal hand abscess. Pt is alert, oriented and follow commands. Trixie Rude, MD for pain 7/10, no PRN pain meds. Physical assessment was done.  And 2RN skin assessment was done with Gaye Pollack, RN. Oriented the pt to the unit and instructed how to use the call bell.

## 2023-01-13 NOTE — Consult Note (Signed)
Reason for Consult:Left hand abscess Referring Physician: Dr. Tommy Medal is an 37 y.o. male.  HPI:   Patient is a 37 year old male who presents with pain and swelling to his left hand.  He has a history of polysubstance abuse including IV drug use as well as schizophrenia.  He has been admitted multiple times in the past for cellulitis and extensive infections in both of his arms per chart review.  He said that the pain and swelling started about 4 days ago.  He denies any known fevers although he has having chills.  Has had some nausea but no vomiting.  He denies any injections into that hand recently.  Past Medical History:  Diagnosis Date   Asthma    Mood disorder ([place])    Polysubstance abuse ([place])    Schizophrenia ([place])     Past Surgical History:  Procedure Laterality Date   ANKLE SURGERY     APPLICATION OF WOUND VAC Right 11/22/2021   Procedure: APPLICATION OF WOUND VAC;  Surgeon: Dayna Barker, MD;  [facility]: [facility];  Service: Plastics;  Laterality: Right;   APPLICATION OF WOUND VAC Right 11/25/2021   Procedure: APPLICATION OF WOUND VAC;  Surgeon: Dayna Barker, MD;  [facility];  Service: Plastics;  Laterality: Right;   APPLICATION OF WOUND VAC Right 12/07/2021   Procedure: APPLICATION OF WOUND VAC;  Surgeon: Dayna Barker, MD;  [facility];  Service: Plastics;  Laterality: Right;   APPLICATION OF WOUND VAC Right 12/14/2021   Procedure: APPLICATION OF WOUND VAC;  Surgeon: [name], MD;  [facility];  Service: Plastics;  Laterality: Right;   APPLICATION OF WOUND VAC Right 12/22/2021   Procedure: WOUND VAC CHANGE;  Surgeon: [name], MD;  [facility];  Service: Plastics;  Laterality: Right;   APPLICATION OF WOUND VAC Right 12/29/2021   Procedure: APPLICATION OF WOUND VAC;  Surgeon: [name], MD;  [facility];  Service: Plastics;  Laterality: Right;   APPLICATION OF WOUND VAC Right 01/03/2022   Procedure: WOUND VAC APPLICATION;   Surgeon: [name], MD;  [facility];  Service: Plastics;  Laterality: Right;   BIOPSY  11/17/2021   Procedure: BIOPSY;  Surgeon: Clarene Essex, MD;  [facility];  Service: Endoscopy;;   BUBBLE STUDY  09/30/2021   Procedure: BUBBLE STUDY;  Surgeon: Dixie Dials, MD;  Location: [place];  Service: Cardiovascular;;   DEBRIDEMENT AND CLOSURE WOUND Right 01/03/2022   Procedure: DEBRIDEMENT AND COMPLEX CLOSURE OF RIGHT ARM WOUND;  Surgeon: [name], MD;  [facility];  Service: Plastics;  Laterality: Right;   ESOPHAGOGASTRODUODENOSCOPY (EGD) WITH PROPOFOL N/A 11/17/2021   Procedure: ESOPHAGOGASTRODUODENOSCOPY (EGD) WITH PROPOFOL;  Surgeon: Clarene Essex, MD;  [facility];  Service: Endoscopy;  Laterality: N/A;   FRACTURE SURGERY     I & D EXTREMITY Right 09/27/2021   Procedure: IRRIGATION AND DEBRIDEMENT OF ABSCESS RIGHT HAND;  Surgeon: Dayna Barker, MD;  Location: [place];  Service: Plastics;  Laterality: Right;   I & D EXTREMITY Bilateral 11/15/2021   Procedure: IRRIGATION AND DEBRIDEMENT EXTREMITY;  Surgeon: Dayna Barker, MD;  [facility];  Service: Plastics;  Laterality: Bilateral;   I & D EXTREMITY Right 11/17/2021   Procedure: IRRIGATION AND DEBRIDEMENT EXTREMITY;  Surgeon: Dayna Barker, MD;  [facility];  Service: Plastics;  Laterality: Right;   I & D EXTREMITY Bilateral 12/01/2021   Procedure: INCISION AND DRAINAGE BILATERAL UPPER EXTREMITIES WITH  WOUND VAC CHANGE RIGHT;  Surgeon: Orene Desanctis, MD;  [facility];  Service: Orthopedics;  Laterality: Bilateral;   I & D EXTREMITY Right 12/07/2021   Procedure: IRRIGATION AND DEBRIDEMENT EXTREMITY, MANIPULATION OF FINGERS UNDER ANESTHESIA;  Surgeon: Dayna Barker, MD;  [facility];  Service: Plastics;  Laterality: Right;   INCISION AND DRAINAGE OF WOUND Left 09/27/2021   Procedure: IRRIGATION AND DEBRIDEMENT OF ABSCESS LEFT INDEX FINGER;  Surgeon: Dayna Barker, MD;  Location:  [place];  Service: Plastics;  Laterality: Left;   INCISION AND DRAINAGE OF WOUND Bilateral 11/22/2021   Procedure: IRRIGATION AND DEBRIDEMENT WOUND;  Surgeon: Dayna Barker, MD;  [facility];  Service: Plastics;  Laterality: Bilateral;   INCISION AND DRAINAGE OF WOUND Bilateral 11/25/2021   Procedure: BILATERAL IRRIGATION AND DEBRIDEMENT BILATERAL ARMS;  Surgeon: Dayna Barker, MD;  [facility];  Service: Plastics;  Laterality: Bilateral;   INCISION AND DRAINAGE OF WOUND Right 12/14/2021   Procedure: IRRIGATION AND DEBRIDEMENT RIGHT ARM;  Surgeon: [name], MD;  [facility];  Service: Plastics;  Laterality: Right;   INCISION AND DRAINAGE OF WOUND Right 12/22/2021   Procedure: IRRIGATION AND DEBRIDEMENT ARM  WOUND;  Surgeon: [name], MD;  [facility];  Service: Plastics;  Laterality: Right;  1 hour   INCISION AND DRAINAGE OF WOUND Right 12/29/2021   Procedure: DEBRIDEMENT RIGHT ARM  WOUND;  Surgeon: [name], MD;  [facility];  Service: Plastics;  Laterality: Right;   JOINT REPLACEMENT     SCLEROTHERAPY  11/17/2021   Procedure: Clide Deutscher;  Surgeon: Clarene Essex, MD;  [facility];  Service: Endoscopy;;   SKIN SPLIT GRAFT Right 01/03/2022   Procedure: SKIN GRAFT SPLIT THICKNESS;  Surgeon: [name], MD;  [facility];  Service: Plastics;  Laterality: Right;   TEE WITHOUT CARDIOVERSION N/A 09/30/2021   Procedure: TRANSESOPHAGEAL ECHOCARDIOGRAM (TEE);  Surgeon: Dixie Dials, MD;  Location[facility];  Service: Cardiovascular;  Laterality: N/A;    History reviewed. No pertinent family history.  Social History:  reports that he has been smoking cigarettes. He has been smoking an average of .5 packs per day. He has never used smokeless tobacco. He reports that he does not currently use alcohol. He reports current drug use. Drugs: IV and Marijuana.  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 Had prolonged block (>30 min) after succinylcholine for GI procedure.    Chlorhexidine Other (See Comments)    Pt does not remember reaction    Naproxen Other (See Comments)    Stomach upset    Medications: I have reviewed the patient's current medications.  Results for orders placed or performed during the hospital encounter of 01/13/23 (from the past 48 hour(s))  Lactic acid, plasma     Status: Abnormal   Collection Time: 01/13/23  1:15 PM  Result Value Ref Range   Lactic Acid, Venous 4.1 (HH) 0.5 - 1.9 mmol/L    Comment: CRITICAL RESULT CALLED TO, READ BACK BY AND VERIFIED WITH LOUIS, M @ 1347 ON 01/13/2023 BY Gloriann Loan Performed at [facility], [place] [address], [place], [place] [address]   Comprehensive metabolic panel     Status: Abnormal   Collection Time: 01/13/23  1:17 PM  Result Value Ref Range   Sodium 138 135 - 145 mmol/L   Potassium 2.4 (LL) 3.5 - 5.1 mmol/L  Comment: CRITICAL RESULT CALLED TO, READ BACK BY AND VERIFIED WITH LOUIS, M @ 1347 ON 01/13/2023 BY Jason Fila, K    Chloride 109 98 - 111 mmol/L   CO2 18 (L) 22 - 32 mmol/L   Glucose, Bld 127 (H) 70 - 99 mg/dL    Comment: Glucose reference range applies only to samples taken after fasting for at least 8 hours.   BUN 8 6 - 20 mg/dL   Creatinine, Ser 0.39 (L) 0.61 - 1.24 mg/dL   Calcium 8.5 (L) 8.9 - 10.3 mg/dL   Total Protein 6.7 6.5 - 8.1 g/dL   Albumin 3.2 (L) 3.5 - 5.0 g/dL   AST 33 15 - 41 U/L   ALT 47 (H) 0 - 44 U/L   Alkaline Phosphatase 127 (H) 38 - 126 U/L   Total Bilirubin 0.4 0.3 - 1.2 mg/dL   GFR, Estimated >60 >60 mL/min    Comment: (NOTE) Calculated using the CKD-EPI Creatinine Equation (2021)    Anion gap 11 5 - 15    Comment: Performed at [facility], [place] [address], [place], [place] [address]  CBC with Differential     Status: Abnormal   Collection  Time: 01/13/23  1:17 PM  Result Value Ref Range   WBC 13.3 (H) 4.0 - 10.5 K/uL   RBC 4.05 (L) 4.22 - 5.81 MIL/uL   Hemoglobin 12.1 (L) 13.0 - 17.0 g/dL   HCT 37.5 (L) 39.0 - 52.0 %   MCV 92.6 80.0 - 100.0 fL   MCH 29.9 26.0 - 34.0 pg   MCHC 32.3 30.0 - 36.0 g/dL   RDW 11.7 11.5 - 15.5 %   Platelets 292 150 - 400 K/uL   nRBC 0.0 0.0 - 0.2 %   Neutrophils Relative % 75 %   Neutro Abs 9.9 (H) 1.7 - 7.7 K/uL   Lymphocytes Relative 17 %   Lymphs Abs 2.2 0.7 - 4.0 K/uL   Monocytes Relative 6 %   Monocytes Absolute 0.9 0.1 - 1.0 K/uL   Eosinophils Relative 1 %   Eosinophils Absolute 0.2 0.0 - 0.5 K/uL   Basophils Relative 0 %   Basophils Absolute 0.0 0.0 - 0.1 K/uL   Immature Granulocytes 1 %   Abs Immature Granulocytes 0.06 0.00 - 0.07 K/uL    Comment: Performed at [facility], [place] [address], [place], [place] [address]  Protime-INR     Status: None   Collection Time: 01/13/23  1:17 PM  Result Value Ref Range   Prothrombin Time 13.1 11.4 - 15.2 seconds   INR 1.0 0.8 - 1.2    Comment: (NOTE) INR goal varies based on device and disease states. Performed at [facility], [place] [address], [place], [place] [address]   APTT     Status: None   Collection Time: 01/13/23  1:17 PM  Result Value Ref Range   aPTT 27 24 - 36 seconds    Comment: Performed at [facility], [place] [address], [place], [place] [address]    DG Hand Complete Left  Result Date: 01/13/2023 CLINICAL DATA:  Left hand abscess.  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 No progressive erosion or bone loss at this location compared to prior. No sites of  erosion or periosteal elevation or seen within  the hand. No fracture or dislocation. Diffuse soft tissue swelling. No soft tissue gas. IMPRESSION: 1. Diffuse soft tissue swelling. No evidence to suggest acute osteomyelitis of the left hand. 2. 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 Electronically Signed   By: Davina Poke D.O.   On: 01/13/2023 14:15    ROS AS NOTED IN CHART Blood pressure 127/76, pulse (!) 106, temperature 97.9 F (36.6 C), resp. rate 18, height 5' 6"$  (1.676 m), weight 56.7 kg, SpO2 99 %. Physical Exam LEFT HAND IMAGES IN CHART,  PT WITH LARGE DORSAL HAND ABSCESS FINGERS DIRTY, WELL PERFUSED ABLE TO EXTEND THUMB AND EXTEND FINGERS  Assessment/Plan: LEFT HAND DORSAL ABSCESS SEPSIS  LEFT HAND INCISION AND DRAINAGE AND DEBRIDEMENT AS INDICATED AS AN EMERGENCY GIVEN TREATMENT FOR SEPSIS AT CURRENT TIME  R/B/A DISCUSSED WITH PT [place]  PT VOICED UNDERSTANDING OF PLAN CONSENT SIGNED DAY OF SURGERY PT SEEN AND EXAMINED PRIOR TO OPERATIVE PROCEDURE/DAY OF SURGERY SITE MARKED. QUESTIONS ANSWERED WILL BE ADMITTED TO MEDICINE FOR TREATMENT FOLLOWING SURGERY   WE ARE PLANNING SURGERY FOR YOUR UPPER EXTREMITY. THE RISKS AND BENEFITS OF SURGERY INCLUDE BUT NOT LIMITED TO BLEEDING INFECTION, DAMAGE TO NEARBY NERVES ARTERIES TENDONS, FAILURE OF SURGERY TO ACCOMPLISH ITS INTENDED GOALS, PERSISTENT SYMPTOMS AND NEED FOR FURTHER SURGICAL INTERVENTION. WITH THIS IN MIND WE WILL PROCEED. I HAVE DISCUSSED WITH THE PATIENT THE PRE AND POSTOPERATIVE REGIMEN AND THE DOS AND DON'TS. PT VOICED UNDERSTANDING AND INFORMED CONSENT SIGNED.  Linna Hoff 01/13/2023

## 2023-01-13 NOTE — Assessment & Plan Note (Signed)
Continue seroquel at night

## 2023-01-13 NOTE — Op Note (Signed)
PREOPERATIVE DIAGNOSIS: Left hand dorsal abscess and tenosynovitis  POSTOPERATIVE DIAGNOSIS: Same  ATTENDING SURGEON: Dr. Iran Planas who scrubbed and present for the entire procedure  ASSISTANT SURGEON: None  ANESTHESIA: General via endotracheal tube  OPERATIVE PROCEDURE: Left hand incision and drainage of complicated dorsal hand abscess Left hand tenosynovectomy third dorsal compartment Left hand tenosynovectomy second dorsal compartment   IMPLANTS: None  EBL: Minimal  RADIOGRAPHIC INTERPRETATION: None  SURGICAL INDICATIONS: Patient is a right-hand-dominant IV drug user.  Noticed increased infection and pain in the left hand.  Patient presented to the ER with the complicated abscess.  The patient had multiple surgeries for multiple IV drug using infections.  Patient here for surgery today.  SURGICAL TECHNIQUE: Patient was prepped identified in the preoperative holding area marked the permanent marker made on the left hand indicate correct operative site.  The patient brought back to operating placed supine on the anesthesia table where the general anesthetic was administered.  Preoperative antibiotics have been begun.  A well-padded tourniquet was then placed on the left brachium and sealed with the appropriate drape.  Left upper extremities then prepped and draped normal sterile fashion.  Timeout was called the correct site was identified procedure then began.  Attention was then turned to the left hand curvilinear incision made directly over the abscess region the tourniquet insufflated.  Gross purulence was encountered over the dorsal aspect of the hand.  Incision and drainage of the complicated abscess was then done.  Following this the second and third dorsal compartment tendons were then carefully identified protection of the superficial branch of the radial nerve was done throughout.  Tenosynovectomy was then carried out of each of these individual tendon regions.  The patient did  have the moderate amount of purulence and necrotic tissue over this region.  Wound cultures were taken. Debridement was then carried out excisional debridement was then carried out with sharp scissors and knife.  This was a 4 x 4 centimeter region of debridement involving both tendon is regions of the second and third dorsal compartment extending towards the edc to the index/fourth compartment.  Wound was then thoroughly irrigated.  After copious wound irrigation the abscess area was then loosely reapproximated and closed with simple Prolene sutures.  Adaptic dressing sterile compressive bandaging applied.  The patient was then placed in a well-padded thumb spica splint extubated taken recovery in good condition.  POSTOPERATIVE PLAN: Patient is to be admitted to the internal medicine service.  IV antibiotics and pain control as per the internal medicine service. The patient can follow up in the office on Thursday He needs to keep the splint on at all times

## 2023-01-13 NOTE — Assessment & Plan Note (Signed)
Vapes daily, nicotine patch See IVDU

## 2023-01-13 NOTE — Sepsis Progress Note (Signed)
Sepsis protocol is being followed by [facility]. 

## 2023-01-13 NOTE — Anesthesia Preprocedure Evaluation (Signed)
Anesthesia Evaluation  Patient identified by MRN, date of birth, ID band Patient awake    Reviewed: Allergy & Precautions, NPO status , Patient's Chart, lab work & pertinent test results  History of Anesthesia Complications (+) PSEUDOCHOLINESTERASE DEFICIENCY and history of anesthetic complications  Airway Mallampati: II  TM Distance: >3 FB Neck ROM: Full    Dental  (+) Teeth Intact, Dental Advisory Given, Poor Dentition, Chipped   Pulmonary asthma , Current Smoker and Patient abstained from smoking.   Pulmonary exam normal breath sounds clear to auscultation       Cardiovascular Normal cardiovascular exam Rhythm:Regular Rate:Normal     Neuro/Psych  PSYCHIATRIC DISORDERS    Schizophrenia  negative neurological ROS     GI/Hepatic negative GI ROS,,,(+)     substance abuse    Endo/Other  negative endocrine ROS    Renal/GU negative Renal ROS     Musculoskeletal negative musculoskeletal ROS (+)    Abdominal   Peds  Hematology  (+) Blood dyscrasia, anemia   Anesthesia Other Findings Day of surgery medications reviewed with the patient.  Reproductive/Obstetrics                             Anesthesia Physical Anesthesia Plan  ASA: 3 and emergent  Anesthesia Plan: General   Post-op Pain Management: Ofirmev IV (intra-op)* and Toradol IV (intra-op)*   Induction: Intravenous  PONV Risk Score and Plan: 1 and Dexamethasone and Ondansetron  Airway Management Planned: Oral ETT  Additional Equipment:   Intra-op Plan:   Post-operative Plan: Extubation in OR  Informed Consent: I have reviewed the patients History and Physical, chart, labs and discussed the procedure including the risks, benefits and alternatives for the proposed anesthesia with the patient or authorized representative who has indicated his/her understanding and acceptance.     Dental advisory given  Plan Discussed with:  CRNA  Anesthesia Plan Comments:        Anesthesia Quick Evaluation

## 2023-01-13 NOTE — Assessment & Plan Note (Signed)
Patient reports drinking at least 12 beers weekly  CIWA protocol MV/Thiamine and folic acid

## 2023-01-13 NOTE — Assessment & Plan Note (Addendum)
Replaced. °

## 2023-01-13 NOTE — Assessment & Plan Note (Signed)
States he injects and snorts pain pills and last used yesterday UDS +opiates. States he has not used fentanyl/heroine/meth in a few years; however was hospitalized for detox in march of 02/2022 for daily fentanyl use and has had 2 OD visits to ER this year -check HIV/hep C -continue coverage for MRSA -SW consult  -at risk for withdrawal and may need to be started on withdrawal protocol here

## 2023-01-13 NOTE — Progress Notes (Signed)
Pharmacy Antibiotic Note  Jason Buchanan is a 37 y.o. male admitted on 01/13/2023 with cellulitis.  Pharmacy has been consulted for vancomycin dosing.  Pt presented to ED with  left hand pain and swelling . PMH includes  polysubstance abuse-severe methamphetamine dependence, IVDU, opioid dependence, alcohol abuse, mood disorder . Pt taken to OR for I&D.   Plan: Vancomycin 1000 mg IV x1 given in ED, then vancomycin 1250 mg IV q12h ( AUC 493, CrCl 115 ml/min, Wt 70.3 kg)  Ceftriaxone 2 gr IV q24h  Monitor clinical course, renal function, cultures as available    Height: 5' 6"$  (167.6 cm) Weight: 76.4 kg (168 lb 6.9 oz) IBW/kg (Calculated) : 63.8  Temp (24hrs), Avg:97.9 F (36.6 C), Min:97.7 F (36.5 C), Max:98.2 F (36.8 C)  Recent Labs  Lab 01/10/23 2018 01/13/23 1315 01/13/23 1317 01/13/23 1715  WBC 13.4*  --  13.3*  --   CREATININE 0.65  --  0.39*  --   LATICACIDVEN  --  4.1*  --  1.2    Estimated Creatinine Clearance: 115.2 mL/min (A) (by C-G formula based on SCr of 0.39 mg/dL (L)).    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 Had prolonged block (>30 min) after succinylcholine for GI procedure.    Chlorhexidine Other (See Comments)    Pt does not remember reaction    Naproxen Other (See Comments)    Stomach upset    Antimicrobials this admission: 2/10 vancomycin >>  2/10 ceftriaxone >>   Dose adjustments this admission:   Microbiology results: 2/10 BCx:  2/10 wound culture:   Thank you for allowing pharmacy to be a part of this patient's care.   Royetta Asal, PharmD, BCPS 01/13/2023 8:20 PM

## 2023-01-13 NOTE — Anesthesia Postprocedure Evaluation (Signed)
Anesthesia Post Note  Patient: Jason Buchanan  Procedure(s) Performed: IRRIGATION AND DEBRIDEMENT EXTREMITY (Left: Hand)     Patient location during evaluation: PACU Anesthesia Type: General Level of consciousness: awake and alert Pain management: pain level controlled Vital Signs Assessment: post-procedure vital signs reviewed and stable Respiratory status: spontaneous breathing, nonlabored ventilation, respiratory function stable and patient connected to nasal cannula oxygen Cardiovascular status: blood pressure returned to baseline and stable Postop Assessment: no apparent nausea or vomiting Anesthetic complications: no   No notable events documented.  Last Vitals:  Vitals:   01/13/23 1730 01/13/23 1815  BP: (!) 135/93 129/82  Pulse: 78 81  Resp: 13 16  Temp:  36.5 C  SpO2: 100% 100%    Last Pain:  Vitals:   01/13/23 1851  TempSrc:   PainSc: [place]

## 2023-01-13 NOTE — Transfer of Care (Signed)
Immediate Anesthesia Transfer of Care Note  Patient: Jason Buchanan  Procedure(s) Performed: IRRIGATION AND DEBRIDEMENT EXTREMITY (Left)  Patient Location: PACU  Anesthesia Type:General  Level of Consciousness: drowsy and patient cooperative  Airway & Oxygen Therapy: Patient Spontanous Breathing and Patient connected to face mask oxygen  Post-op Assessment: Report given to RN and Post -op Vital signs reviewed and stable  Post vital signs: Reviewed and stable  Last Vitals:  Vitals Value Taken Time  BP 125/87 01/13/23 1630  Temp 36.5 C 01/13/23 1629  Pulse 103 01/13/23 1633  Resp 17 01/13/23 1633  SpO2 100 % 01/13/23 1633  Vitals shown include unvalidated device data.  Last Pain:  Vitals:   01/13/23 1630  PainSc: Asleep         Complications: No notable events documented.

## 2023-01-14 ENCOUNTER — Encounter (HOSPITAL_COMMUNITY): Payer: Self-pay | Admitting: Orthopedic Surgery

## 2023-01-14 DIAGNOSIS — E872 Acidosis, unspecified: Secondary | ICD-10-CM

## 2023-01-14 DIAGNOSIS — R7401 Elevation of levels of liver transaminase levels: Secondary | ICD-10-CM

## 2023-01-14 DIAGNOSIS — F1990 Other psychoactive substance use, unspecified, uncomplicated: Secondary | ICD-10-CM

## 2023-01-14 DIAGNOSIS — L0390 Cellulitis, unspecified: Secondary | ICD-10-CM

## 2023-01-14 DIAGNOSIS — F1910 Other psychoactive substance abuse, uncomplicated: Secondary | ICD-10-CM

## 2023-01-14 DIAGNOSIS — E876 Hypokalemia: Secondary | ICD-10-CM

## 2023-01-14 DIAGNOSIS — Z789 Other specified health status: Secondary | ICD-10-CM

## 2023-01-14 DIAGNOSIS — A419 Sepsis, unspecified organism: Secondary | ICD-10-CM

## 2023-01-14 DIAGNOSIS — F39 Unspecified mood [affective] disorder: Secondary | ICD-10-CM

## 2023-01-14 LAB — CBC
HCT: 41.5 % (ref 39.0–52.0)
Hemoglobin: 13.1 g/dL (ref 13.0–17.0)
MCH: 30 pg (ref 26.0–34.0)
MCHC: 31.6 g/dL (ref 30.0–36.0)
MCV: 95 fL (ref 80.0–100.0)
Platelets: 330 10*3/uL (ref 150–400)
RBC: 4.37 MIL/uL (ref 4.22–5.81)
RDW: 11.8 % (ref 11.5–15.5)
WBC: 16.4 10*3/uL — ABNORMAL HIGH (ref 4.0–10.5)
nRBC: 0 % (ref 0.0–0.2)

## 2023-01-14 LAB — COMPREHENSIVE METABOLIC PANEL
ALT: 44 U/L (ref 0–44)
AST: 35 U/L (ref 15–41)
Albumin: 3.2 g/dL — ABNORMAL LOW (ref 3.5–5.0)
Alkaline Phosphatase: 131 U/L — ABNORMAL HIGH (ref 38–126)
Anion gap: 12 (ref 5–15)
BUN: 10 mg/dL (ref 6–20)
CO2: 19 mmol/L — ABNORMAL LOW (ref 22–32)
Calcium: 8.7 mg/dL — ABNORMAL LOW (ref 8.9–10.3)
Chloride: 107 mmol/L (ref 98–111)
Creatinine, Ser: 0.46 mg/dL — ABNORMAL LOW (ref 0.61–1.24)
GFR, Estimated: >60 mL/min (ref >60)
Glucose, Bld: 128 mg/dL — ABNORMAL HIGH (ref 70–99)
Potassium: 3.9 mmol/L (ref 3.5–5.1)
Sodium: 138 mmol/L (ref 135–145)
Total Bilirubin: 0.4 mg/dL (ref 0.3–1.2)
Total Protein: 6.7 g/dL (ref 6.5–8.1)

## 2023-01-14 LAB — HEPATITIS PANEL, ACUTE
HCV Ab: REACTIVE — AB
Hep A IgM: NONREACTIVE
Hep B C IgM: NONREACTIVE
Hepatitis B Surface Ag: NONREACTIVE

## 2023-01-14 LAB — HIV ANTIBODY (ROUTINE TESTING W REFLEX): HIV Screen 4th Generation wRfx: NONREACTIVE

## 2023-01-14 LAB — MAGNESIUM: Magnesium: 2.1 mg/dL (ref 1.7–2.4)

## 2023-01-14 MED ORDER — HYDROMORPHONE HCL 1 MG/ML IJ SOLN
1.0000 mg | INTRAMUSCULAR | Status: DC | PRN
  Administered 2023-01-14 – 2023-01-16 (×20): 1 mg via INTRAVENOUS
  Filled 2023-01-14 (×20): qty 1

## 2023-01-14 MED ORDER — HYDROMORPHONE HCL 1 MG/ML IJ SOLN
0.5000 mg | INTRAMUSCULAR | Status: DC | PRN
  Administered 2023-01-16: 0.5 mg via INTRAVENOUS
  Filled 2023-01-14: qty 0.5

## 2023-01-14 MED ORDER — SENNA 8.6 MG PO TABS
2.0000 | ORAL_TABLET | Freq: Every day | ORAL | Status: DC
  Filled 2023-01-14 (×2): qty 2

## 2023-01-14 MED ORDER — POLYETHYLENE GLYCOL 3350 17 G PO PACK
17.0000 g | PACK | Freq: Every day | ORAL | Status: DC
  Administered 2023-01-14: 17 g via ORAL
  Filled 2023-01-14 (×2): qty 1

## 2023-01-14 MED ORDER — CLONIDINE HCL 0.1 MG PO TABS: 0.1000 mg | ORAL_TABLET | Freq: Three times a day (TID) | ORAL | Status: DC | PRN

## 2023-01-14 MED ORDER — LACTATED RINGERS IV SOLN: INTRAVENOUS | Status: DC

## 2023-01-14 NOTE — Progress Notes (Signed)
Upon entering the patient's room to do vital signs, the nurse tech found the patient holding an e-cigarette (vape) in his hand while sleeping. The nurse tech then called this nurse into the patient's room. This nurse educated the patient on the use of e-cigarettes in his room. The patient voiced his understanding and the e-cigarette was removed from the patient's room and placed at the nurse's station in a clear plastic and a patient's label.

## 2023-01-14 NOTE — Assessment & Plan Note (Signed)
Secondary to volume depletion and severe infection Resolving with aggressive intravenous volume resuscitation and intravenous antibiotics

## 2023-01-14 NOTE — Progress Notes (Signed)
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  Upon evaluation in the emergency department patient was found to exhibit multiple SIRS criteria concerning for sepsis along with clinical evidence of cellulitis and abscess of the left hand.  X-rays performed revealing erosive changes involving multiple carpal bones with diffuse intercarpal and radiocarpal joint space narrowing.  EDP discussed case with Dr. Apolonio Schneiders with hand surgery who evaluated the case and in consultation and the patient for left hand incision drainage and debridement the afternoon of 2/10.  The hospitalist group was also called to assess the patient for admission the hospital.  For patient's current sepsis and marked lactic acidosis with lactate of 4.1, patient was placed on intravenous vancomycin and Rocephin.  Patient was aggressively hydrated with intravenous isotonic fluids.  Blood cultures were obtained.  Concerning patient's active polysubstance abuse including intravenous drug abuse with opiates patient is being monitored for evidence of withdrawal.   Assessment and Plan: * Abscess of left hand Clinically improving status post operative intervention including left hand tenosynovectomy with incision and drainage with Dr. Apolonio Schneiders on 2/10 Hand surgery's recommendations are appreciated. Continuing intravenous antibiotics including vancomycin  and ceftriaxone Intraoperative cultures revealing gram-positive cocci thus far, will await final culture results and sensitivities. Despite patient's history of IVDU, providing patient with appropriate amounts of intravenous opiate-based analgesics for extremely severe pain.  These medications will be discontinued as quickly as possible. Continue intravenous fluids. Dressing changes of the left hand per hand surgery  Sepsis due to cellulitis [facility]) Please see assessment and plan above  Lactic acidosis Secondary to volume depletion and severe infection Resolving with aggressive intravenous volume resuscitation and intravenous antibiotics  Hypokalemia Replaced   Polysubstance abuse ([place]) Polysubstance abuse including injecting heroin Counseling daily on cessation  Patient does not quite seem ready to stop using  TOC referral for providing list of local resources for rehabilitation Despite providing patient with opiate-based analgesics for pain he likely will suffer from some degree of withdrawal, will provide as needed clonidine for symptoms.  IVDU (intravenous drug user) Please see assessment and plan above.  Alcohol use Patient reports drinking at least 12 beers weekly  CIWA protocol MV/Thiamine and folic acid   Elevated transaminase level Resolved  Mood disorder (HCC) Continue seroquel at night     Subjective:  Patient complaining of severe left hand pain, 10 out of 10 in intensity, sharp in quality, radiating proximally, worse with movement of the affected extremity.  Physical Exam:  Vitals:   01/14/23 0635 01/14/23 1018 01/14/23 1429 01/14/23 2029  BP: 136/86 (!) 141/81 (!) 152/91 (!) 129/97  Pulse: 82 100 (!) 108 (!) 101  Resp: 20 18 18 20  $ Temp: 97.6 F (36.4 C) (!) 97.5 F (36.4 C) (!) 97.4 F (36.3 C) 98.1 F (36.7 C)  TempSrc: Oral Oral Oral Oral  SpO2: 98% 98% 98% 99%  Weight:      Height:        Constitutional: Awake alert and oriented x3, patient  in distress due to pain Skin: Left hand and arm dressing clean dry and intact.  Track marks noted.  Poor skin turgor.   Eyes: Pupils are equally reactive to light.  No evidence of scleral icterus or conjunctival pallor.  ENMT: Moist mucous membranes noted.  Posterior pharynx clear of any exudate or lesions.   Respiratory: clear to auscultation bilaterally, no wheezing, no crackles. Normal respiratory effort. No accessory muscle use.  Cardiovascular: Tachycardic and regular, no murmurs / rubs / gallops. No extremity edema. 2+ pedal pulses. No carotid bruits.  Notable good capillary refill of the fingers of the left hand. Abdomen: Abdomen is soft and nontender.  No evidence of intra-abdominal masses.  Positive bowel sounds noted in all quadrants.   Musculoskeletal: Exquisite tenderness of the fingertips of the left hand.  Good range of motion of the fingers of the left hand.   Data Reviewed:  I have personally reviewed and interpreted labs, imaging.  Significant findings are   CBC: Recent Labs  Lab 01/10/23 2018 01/13/23 1317 01/14/23 0547  WBC 13.4* 13.3* 16.4*  NEUTROABS 10.7* 9.9*  --   HGB 13.9 12.1* 13.1  HCT 41.7 37.5* 41.5  MCV 90.5 92.6 95.0  PLT 290 292 [id number]   Basic Metabolic Panel: Recent Labs  Lab 01/10/23 2018 01/13/23 1317 01/13/23 1715 01/13/23 2130 01/14/23 0547  NA 137 138  --  142 138  K 4.7 2.4*  --  4.0 3.9  CL 103 109  --  109 107  CO2 24 18*  --  23 19*  GLUCOSE 105* 127*  --  143* 128*  BUN 8 8  --  8 10  CREATININE 0.65 0.39*  --  0.61 0.46*  CALCIUM 8.9 8.5*  --  8.6* 8.7*  MG  --   --  1.6*  --  2.1   GFR: Estimated Creatinine Clearance: 115.2 mL/min (A) (by C-G formula based on SCr of 0.46 mg/dL (L)). Liver Function Tests: Recent Labs  Lab 01/10/23 2018 01/13/23 1317 01/14/23 0547  AST 82* 33 35  ALT 72* 47* 44  ALKPHOS 158* 127* 131*  BILITOT 1.1 0.4 0.4  PROT 6.9 6.7 6.7  ALBUMIN 3.7 3.2* 3.2*    Coagulation Profile: Recent Labs   Lab 01/13/23 1317  INR 1.0     EKG/Telemetry: Personally reviewed.  Rhythm is cardia with heart rate of 100 bpm.  No dynamic ST segment changes appreciated.   Code Status:  Full code.  Code status decision has been confirmed with: patient   Severity of Illness:  The appropriate patient status for this patient is INPATIENT. Inpatient status is judged to be reasonable and necessary in order to provide the required intensity of service to ensure the patient's safety. The patient's presenting symptoms, physical exam findings, and initial radiographic and laboratory data in the context of their chronic comorbidities is felt to place them at high risk for further clinical deterioration. Furthermore, it is not anticipated that the patient will be medically stable for discharge from the hospital within 2 midnights of admission.   * I certify that at the point of admission it is my clinical judgment that the patient will require inpatient hospital care spanning beyond 2 midnights from the point of admission due to high intensity of service, high risk for further deterioration and high frequency of surveillance required.*  Time spent:  57 minutes  Author:  Vernelle Emerald MD  01/14/2023 11:54 PM

## 2023-01-14 NOTE — Assessment & Plan Note (Signed)
·   Please see assessment and plan above °

## 2023-01-14 NOTE — Hospital Course (Signed)
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  Upon evaluation in the emergency department patient was found to exhibit multiple SIRS criteria concerning for sepsis along with clinical evidence of cellulitis and abscess of the left hand.  X-rays performed revealing erosive changes involving multiple carpal bones with diffuse intercarpal and radiocarpal joint space narrowing.  EDP discussed case with Dr. Apolonio Schneiders with hand surgery who evaluated the case and in consultation and the patient for left hand incision drainage and debridement the afternoon of 2/10.  The hospitalist group was also called to assess the patient for admission the hospital.  For patient's current sepsis and marked lactic acidosis with lactate of 4.1, patient was placed on intravenous vancomycin and Rocephin.  Patient was aggressively hydrated with intravenous isotonic fluids.  Blood cultures were obtained.  Concerning patient's active polysubstance abuse including intravenous drug abuse with opiates patient was monitored for evidence of withdrawal throughout the hospitalization.  Due to patient's significant pain status post operative intervention patient was provided with judicious doses of opiate based analgesics throughout the hospitalization.  Intraoperative cultures ended up growing out Streptococcus intermedius.  Patient patient clinically improved over the next several days along with resolving lactic acidosis and improving pain.  Patient  was eventually transitioned to a home-going regimen of oral Augmentin and doxycycline to  complete his antibiotic course.  Patient was discharged home in improved and stable condition on 01/16/2023.

## 2023-01-15 LAB — COMPREHENSIVE METABOLIC PANEL
ALT: 40 U/L (ref 0–44)
AST: 28 U/L (ref 15–41)
Albumin: 3.2 g/dL — ABNORMAL LOW (ref 3.5–5.0)
Alkaline Phosphatase: 116 U/L (ref 38–126)
Anion gap: 7 (ref 5–15)
BUN: 14 mg/dL (ref 6–20)
CO2: 25 mmol/L (ref 22–32)
Calcium: 8.5 mg/dL — ABNORMAL LOW (ref 8.9–10.3)
Chloride: 108 mmol/L (ref 98–111)
Creatinine, Ser: 0.52 mg/dL — ABNORMAL LOW (ref 0.61–1.24)
GFR, Estimated: >60 mL/min (ref >60)
Glucose, Bld: 113 mg/dL — ABNORMAL HIGH (ref 70–99)
Potassium: 3.3 mmol/L — ABNORMAL LOW (ref 3.5–5.1)
Sodium: 140 mmol/L (ref 135–145)
Total Bilirubin: 0.4 mg/dL (ref 0.3–1.2)
Total Protein: 6.6 g/dL (ref 6.5–8.1)

## 2023-01-15 LAB — CBC WITH DIFFERENTIAL/PLATELET
Abs Immature Granulocytes: 0.03 10*3/uL (ref 0.00–0.07)
Basophils Absolute: 0.1 10*3/uL (ref 0.0–0.1)
Basophils Relative: 1 %
Eosinophils Absolute: 0.4 10*3/uL (ref 0.0–0.5)
Eosinophils Relative: 3 %
HCT: 39.9 % (ref 39.0–52.0)
Hemoglobin: 12.9 g/dL — ABNORMAL LOW (ref 13.0–17.0)
Immature Granulocytes: 0 %
Lymphocytes Relative: 34 %
Lymphs Abs: 3.6 10*3/uL (ref 0.7–4.0)
MCH: 29.8 pg (ref 26.0–34.0)
MCHC: 32.3 g/dL (ref 30.0–36.0)
MCV: 92.1 fL (ref 80.0–100.0)
Monocytes Absolute: 0.8 10*3/uL (ref 0.1–1.0)
Monocytes Relative: 7 %
Neutro Abs: 5.8 10*3/uL (ref 1.7–7.7)
Neutrophils Relative %: 55 %
Platelets: 346 10*3/uL (ref 150–400)
RBC: 4.33 MIL/uL (ref 4.22–5.81)
RDW: 11.9 % (ref 11.5–15.5)
WBC: 10.7 10*3/uL — ABNORMAL HIGH (ref 4.0–10.5)
nRBC: 0 % (ref 0.0–0.2)

## 2023-01-15 LAB — MRSA NEXT GEN BY PCR, NASAL: MRSA by PCR Next Gen: DETECTED — AB

## 2023-01-15 LAB — MAGNESIUM: Magnesium: 1.9 mg/dL (ref 1.7–2.4)

## 2023-01-15 MED ORDER — METRONIDAZOLE 500 MG/100ML IV SOLN: 500.0000 mg | Freq: Two times a day (BID) | INTRAVENOUS | Status: DC

## 2023-01-15 MED ORDER — POTASSIUM CHLORIDE CRYS ER 20 MEQ PO TBCR
40.0000 meq | EXTENDED_RELEASE_TABLET | Freq: Once | ORAL | Status: AC
  Administered 2023-01-15: 40 meq via ORAL
  Filled 2023-01-15: qty 2

## 2023-01-15 MED ORDER — MUPIROCIN 2 % EX OINT
1.0000 | TOPICAL_OINTMENT | Freq: Two times a day (BID) | CUTANEOUS | Status: DC
  Administered 2023-01-15 – 2023-01-16 (×2): 1 via NASAL
  Filled 2023-01-15: qty 22

## 2023-01-15 MED ORDER — KETOROLAC TROMETHAMINE 15 MG/ML IJ SOLN
15.0000 mg | Freq: Four times a day (QID) | INTRAMUSCULAR | Status: DC
  Administered 2023-01-15 – 2023-01-16 (×4): 15 mg via INTRAVENOUS
  Filled 2023-01-15 (×4): qty 1

## 2023-01-15 NOTE — TOC Initial Note (Signed)
Transition of Care [facility]) - Initial/Assessment Note    Patient Details  Name: Jason Buchanan MRN: [id number] Date of Birth: 11/13/1986  Transition of Care [facility]) CM/SW Contact:    Vassie Moselle, LCSW Phone Number: 01/15/2023, 12:04 PM  Clinical Narrative:                 Met with pt to discuss SA use and SDOH concerns. Pt shares he is currently homeless and has been sleeping outside due to shelters being full. He shares he does not always eat due to having limited money and options. Pt does go to [facility] and [employer] [facility] [employer] at times for food and shelter when available. Pt shares that he currently has been using THC, methamphetamines, and opiates via snorting and IV. Pt reports wanting to quit substance use. Pt is agreeable to resources for outpatient and residential substance use placed on discharge packet. Pt plans to go to [facility] for intake at discharge. Resources for SA, shelters, transportation, and financial assistance placed on AVS.  Pt has no insurance and no PCP. Pt agreeable to having PCP appointment scheduled and shares afternoons would work best for him. PCP appt schedule at [facility] on Friday, 01/19/23 at 2:20pm.  Pt will need bus pass at discharge. TOC following for possible medication assistance.   Expected Discharge Plan: Homeless Shelter Barriers to Discharge: No Barriers Identified   Patient Goals and CMS Choice Patient states their goals for this hospitalization and ongoing recovery are:: To go to [facility].gov Compare Post Acute Care list provided to:: Patient Choice offered to / list presented to : Patient      Expected Discharge Plan and Services In-house Referral: Clinical Social Work Discharge Planning Services: [facility], Medication Assistance, CM Consult, Follow-up appt scheduled Post Acute Care Choice: NA Living arrangements for the past 2 months: Homeless                 DME Arranged: N/A DME Agency: NA                   Prior Living Arrangements/Services Living arrangements for the past 2 months: Homeless Lives with:: Self Patient language and need for interpreter reviewed:: Yes Do you feel safe going back to the place where you live?: Yes      Need for Family Participation in Patient Care: No (Comment) Care giver support system in place?: No (comment)   Criminal Activity/Legal Involvement Pertinent to Current Situation/Hospitalization: No - Comment as needed  Activities of Daily Living Home Assistive Devices/Equipment: None ADL Screening (condition at time of admission) Patient's cognitive ability adequate to safely complete daily activities?: No Is the patient deaf or have difficulty hearing?: No Does the patient have difficulty seeing, even when wearing glasses/contacts?: No Does the patient have difficulty concentrating, remembering, or making decisions?: No Patient able to express need for assistance with ADLs?: No Does the patient have difficulty dressing or bathing?: No Independently performs ADLs?: No Communication: Independent Dressing (OT): Independent Grooming: Independent Feeding: Independent Bathing: Independent Toileting: Independent In/Out Bed: Needs assistance Is this a change from baseline?: Pre-admission baseline Walks in Home: Independent Does the patient have difficulty walking or climbing stairs?: No Weakness of Legs: None Weakness of Arms/Hands: None  Permission Sought/Granted Permission sought to share information with : PCP Permission granted to share information with : Yes, Verbal Permission Granted     Permission granted to share info w AGENCY: PCP  Emotional Assessment Appearance:: Appears stated age Attitude/Demeanor/Rapport: Engaged Affect (typically observed): Accepting Orientation: : Oriented to Self, Oriented to Place, Oriented to  Time, Oriented to Situation Alcohol / Substance Use: Illicit Drugs, Tobacco Use (amphetamines, opiates,  THC) Psych Involvement: No (comment)  Admission diagnosis:  Hypokalemia [E87.6] Abscess of hand, left [L02.512] Sepsis without acute organ dysfunction, due to unspecified organism [facility]) [A41.9] Abscess of left hand [L02.512] Patient Active Problem List   Diagnosis Date Noted   Lactic acidosis 01/14/2023   Abscess of left hand 01/13/2023   Elevated transaminase level 01/13/2023   Mood disorder ([place]) 01/13/2023   Alcohol use 01/13/2023   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   Bacterial skin infection of upper extremity 12/04/2021   Protein-calorie malnutrition, severe 11/25/2021   Hepatitis C 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    Acute blood loss anemia 11/19/2021   Upper GI bleed 11/19/2021   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 [place] [address] Phone: [phone] Fax: [place] [address] [address] Phone: [phone] Fax: [phone]  [id number]Phone: [phone]: [phone] [facility] #[id number] [id number] [id number]: [phone]: [id number] [place], [place] [id number]78hone: [phone]:  [phone]   Social Determinants of Health (SDShannon HillsSocial History: SDEmpireNo Food Insecurity (01/13/2023)  Housing: High Risk (01/13/2023)  Transportation Needs: Unmet Transportation Needs (01/13/2023)  Utilities: Not At Risk (01/15/2023)  Recent Concern: Utilities - At Risk (01/13/2023)  Tobacco Use: High Risk (01/14/2023)   SDOH Interventions: Housing Interventions: Inpatient TOC, Other (Comment) (Shelter resources placed on AVS) Transportation Interventions: Inpatient TOC, Bus Pass Given, Other (Comment) (Resources added to AVS) Utilities Interventions: Intervention Not Indicated, Inpatient TOC   Readmission Risk Interventions    01/15/2023   12:02 PM  Readmission Risk Prevention Plan  Transportation Screening Complete  PCP or Specialist Appt within 3-5 Days Complete  HRI or [place]  Social Work Consult for ReSchrieverlanning/Counseling Complete  Palliative Care Screening Not Applicable  Medication Review (RPress photographerComplete

## 2023-01-15 NOTE — Progress Notes (Signed)
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  Upon evaluation in the emergency department patient was found to exhibit multiple SIRS criteria concerning for sepsis along with clinical evidence of cellulitis and abscess of the left hand.  X-rays performed revealing erosive changes involving multiple carpal bones with diffuse intercarpal and radiocarpal joint space narrowing.  EDP discussed case with Dr. Apolonio Schneiders with hand surgery who evaluated the case and in consultation and the patient for left hand incision drainage and debridement the afternoon of 2/10.  The hospitalist group was also called to assess the patient for admission the hospital.  For patient's current sepsis and marked lactic acidosis with lactate of 4.1, patient was placed on intravenous vancomycin and Rocephin.  Patient was aggressively hydrated with intravenous isotonic fluids.  Blood cultures were obtained.  Concerning patient's active polysubstance abuse including intravenous drug abuse with opiates patient is being monitored for evidence of withdrawal.   Assessment and Plan: * Abscess of left hand Intraoperative wound culture growing out Streptococcus intermedius Patient continuing to experience severe pain Adjusting antibiotics by discontinuing ceftriaxone and instead placing patient on Unasyn in combination with vancomycin. MRSA PCR  screen positive Status post operative intervention including left hand tenosynovectomy with incision and drainage with Dr. Apolonio Schneiders on 2/10 Hand surgery's recommendations are appreciated. Despite patient's history of IVDU, providing patient with appropriate amounts of intravenous opiate-based analgesics for extremely severe pain.  These medications will be discontinued as quickly as possible. Discontinuing intravenous fluids Dressing changes of the left hand per hand surgery, will need outpatient follow-up  Sepsis due to cellulitis [facility]) Please see assessment and plan above  Lactic acidosis Secondary to volume depletion and severe infection Resolving with aggressive intravenous volume resuscitation and intravenous antibiotics  Hypokalemia Recurrent hypokalemia Replacing with potassium chloride Monitoring potassium levels with serial chemistries  Polysubstance abuse (HCC) Polysubstance abuse including methamphetamines, marijuana and intravenous drugs including heroin and other opiates. Counseling daily on cessation  Patient does not quite seem ready to stop using  TOC has met with patient has provided patient with resources to assist with rehabilitation Providing patient with as needed opiate-based analgesics for pain control for patient's very substantial postoperative pain.  Will provide as needed clonidine for symptoms  IVDU (intravenous drug user) Please see assessment and plan above.  Alcohol use Patient reports drinking at least 12 beers weekly  CIWA protocol MV/Thiamine and folic acid   Elevated transaminase level Resolved  Mood disorder (HCC) Continue seroquel at night     Subjective:  Patient complaining of continued left hand pain sharp in quality, severe in intensity radiating proximally, worse with movement of the affected extremity.  Physical Exam:  Vitals:   01/14/23 2029 01/15/23 0438 01/15/23 1251 01/15/23 1937  BP: (!) 129/97 135/87 (!) 147/99 136/88   Pulse: (!) 101 73 83 93  Resp: 20 18 18 17  $ Temp: 98.1 F (36.7 C) 97.6 F (36.4 C) (!) 97.5 F (36.4 C) 98.1 F (36.7 C)  TempSrc: Oral Oral Oral  Oral  SpO2: 99% 97% 100% 97%  Weight:      Height:        Constitutional: Awake alert and oriented x3, patient in distress due to pain Skin: Left hand and arm dressing clean dry and intact.  Track marks noted.  Poor skin turgor.   Eyes: Pupils are equally reactive to light.  No evidence of scleral icterus or conjunctival pallor.  ENMT: Moist mucous membranes noted.  Posterior pharynx clear of any exudate or lesions.   Respiratory: clear to auscultation bilaterally, no wheezing, no crackles. Normal respiratory effort. No accessory muscle use.  Cardiovascular: Tachycardic and regular, no murmurs / rubs / gallops. No extremity edema. 2+ pedal pulses. No carotid bruits.  Notable good capillary refill of the fingers of the left hand. Abdomen: Abdomen is soft and nontender.  No evidence of intra-abdominal masses.  Positive bowel sounds noted in all quadrants.   Musculoskeletal: Exquisite tenderness of the fingertips of the left hand.  Good range of motion of the fingers of the left hand.   Data Reviewed:  I have personally reviewed and interpreted labs, imaging.  Significant findings are   CBC: Recent Labs  Lab 01/10/23 2018 01/13/23 1317 01/14/23 0547 01/15/23 0452  WBC 13.4* 13.3* 16.4* 10.7*  NEUTROABS 10.7* 9.9*  --  5.8  HGB 13.9 12.1* 13.1 12.9*  HCT 41.7 37.5* 41.5 39.9  MCV 90.5 92.6 95.0 92.1  PLT 290 292 330 [id number]   Basic Metabolic Panel: Recent Labs  Lab 01/10/23 2018 01/13/23 1317 01/13/23 1715 01/13/23 2130 01/14/23 0547 01/15/23 0452  NA 137 138  --  142 138 140  K 4.7 2.4*  --  4.0 3.9 3.3*  CL 103 109  --  109 107 108  CO2 24 18*  --  23 19* 25  GLUCOSE 105* 127*  --  143* 128* 113*  BUN 8 8  --  8 10 14  $ CREATININE 0.65 0.39*  --  0.61 0.46* 0.52*  CALCIUM 8.9 8.5*  --  8.6* 8.7* 8.5*  MG  --   --   1.6*  --  2.1 1.9   GFR: Estimated Creatinine Clearance: 115.2 mL/min (A) (by C-G formula based on SCr of 0.52 mg/dL (L)). Liver Function Tests: Recent Labs  Lab 01/10/23 2018 01/13/23 1317 01/14/23 0547 01/15/23 0452  AST 82* 33 35 28  ALT 72* 47* 44 40  ALKPHOS 158* 127* 131* 116  BILITOT 1.1 0.4 0.4 0.4  PROT 6.9 6.7 6.7 6.6  ALBUMIN 3.7 3.2* 3.2* 3.2*    Coagulation Profile: Recent Labs  Lab 01/13/23 1317  INR 1.0    Code Status:  Full code.  Code status decision has been confirmed with: patient   Severity of Illness:  The appropriate patient status for this patient is INPATIENT. Inpatient status is judged to be reasonable and necessary in order to provide the required intensity of service to ensure the patient's safety. The patient's presenting symptoms, physical exam findings, and initial radiographic and laboratory data in the context of their chronic comorbidities is felt to place them at high risk for further clinical deterioration. Furthermore, it is not anticipated that the patient will be medically stable for discharge from the hospital within 2 midnights of admission.   * I certify that at the point of admission it is my clinical judgment that the patient will require inpatient hospital care spanning beyond 2 midnights from the point of admission due to high intensity of service, high risk for  further deterioration and high frequency of surveillance required.*  Time spent:  54 minutes  Author:  Vernelle Emerald MD  01/15/2023 9:51 PM

## 2023-01-16 ENCOUNTER — Other Ambulatory Visit (HOSPITAL_COMMUNITY): Payer: Self-pay

## 2023-01-16 LAB — CBC WITH DIFFERENTIAL/PLATELET
Abs Immature Granulocytes: 0.07 10*3/uL (ref 0.00–0.07)
Basophils Absolute: 0.2 10*3/uL — ABNORMAL HIGH (ref 0.0–0.1)
Basophils Relative: 2 %
Eosinophils Absolute: 0.8 10*3/uL — ABNORMAL HIGH (ref 0.0–0.5)
Eosinophils Relative: 8 %
HCT: 45 % (ref 39.0–52.0)
Hemoglobin: 14.6 g/dL (ref 13.0–17.0)
Immature Granulocytes: 1 %
Lymphocytes Relative: 35 %
Lymphs Abs: 3.3 10*3/uL (ref 0.7–4.0)
MCH: 29.6 pg (ref 26.0–34.0)
MCHC: 32.4 g/dL (ref 30.0–36.0)
MCV: 91.3 fL (ref 80.0–100.0)
Monocytes Absolute: 0.8 10*3/uL (ref 0.1–1.0)
Monocytes Relative: 8 %
Neutro Abs: 4.4 10*3/uL (ref 1.7–7.7)
Neutrophils Relative %: 46 %
Platelets: 393 10*3/uL (ref 150–400)
RBC: 4.93 MIL/uL (ref 4.22–5.81)
RDW: 11.9 % (ref 11.5–15.5)
WBC: 9.5 10*3/uL (ref 4.0–10.5)
nRBC: 0 % (ref 0.0–0.2)

## 2023-01-16 LAB — COMPREHENSIVE METABOLIC PANEL
ALT: 42 U/L (ref 0–44)
AST: 33 U/L (ref 15–41)
Albumin: 3.3 g/dL — ABNORMAL LOW (ref 3.5–5.0)
Alkaline Phosphatase: 142 U/L — ABNORMAL HIGH (ref 38–126)
Anion gap: 11 (ref 5–15)
BUN: 21 mg/dL — ABNORMAL HIGH (ref 6–20)
CO2: 22 mmol/L (ref 22–32)
Calcium: 8.5 mg/dL — ABNORMAL LOW (ref 8.9–10.3)
Chloride: 102 mmol/L (ref 98–111)
Creatinine, Ser: 0.34 mg/dL — ABNORMAL LOW (ref 0.61–1.24)
GFR, Estimated: >60 mL/min (ref >60)
Glucose, Bld: 100 mg/dL — ABNORMAL HIGH (ref 70–99)
Potassium: 4 mmol/L (ref 3.5–5.1)
Sodium: 135 mmol/L (ref 135–145)
Total Bilirubin: 0.3 mg/dL (ref 0.3–1.2)
Total Protein: 6.9 g/dL (ref 6.5–8.1)

## 2023-01-16 LAB — MAGNESIUM: Magnesium: 2.1 mg/dL (ref 1.7–2.4)

## 2023-01-16 LAB — C-REACTIVE PROTEIN: CRP: 1.3 mg/dL — ABNORMAL HIGH (ref <1.0)

## 2023-01-16 MED ORDER — DOXYCYCLINE HYCLATE 100 MG PO CAPS
100.0000 mg | ORAL_CAPSULE | Freq: Two times a day (BID) | ORAL | 0 refills | Status: AC
  Filled 2023-01-16: qty 16, 8d supply, fill #0

## 2023-01-16 MED ORDER — SENNA 8.6 MG PO TABS: 2.0000 | ORAL_TABLET | Freq: Every day | ORAL | 0 refills | Status: DC

## 2023-01-16 MED ORDER — POLYETHYLENE GLYCOL 3350 17 GM/SCOOP PO POWD: 17.0000 g | Freq: Every day | ORAL | 0 refills | Status: DC | PRN

## 2023-01-16 MED ORDER — OXYCODONE HCL 10 MG PO TABS
10.0000 mg | ORAL_TABLET | Freq: Four times a day (QID) | ORAL | 0 refills | Status: AC | PRN
  Filled 2023-01-16: qty 12, 3d supply, fill #0

## 2023-01-16 MED ORDER — AMOXICILLIN-POT CLAVULANATE 875-125 MG PO TABS
1.0000 | ORAL_TABLET | Freq: Two times a day (BID) | ORAL | 0 refills | Status: AC
  Filled 2023-01-16: qty 16, 8d supply, fill #0

## 2023-01-16 MED ORDER — SODIUM CHLORIDE 0.9 % IV SOLN
3.0000 g | Freq: Four times a day (QID) | INTRAVENOUS | Status: DC
  Administered 2023-01-16 (×2): 3 g via INTRAVENOUS
  Filled 2023-01-16 (×3): qty 8

## 2023-01-16 NOTE — Discharge Instructions (Addendum)
Please take all prescribed antibiotics exactly as instructed And new primary care provider appointment has been arranged for you.  Please follow-up.  Also, please contact [facility] hand surgery clinic to confirm your appointment on Thursday. Leave your left hand dressing on until you follow-up with hand surgery clinic on 2/15.  You may cover the hand with a bag if showering. Please return to the emergency department if you develop worsening pain, fevers or weakness Please abstain from using any illicit substances such as methamphetamines or for narcotics.  Please use the attached resources to find options for rehabilitation in the area.

## 2023-01-16 NOTE — Progress Notes (Signed)
Pharmacy Antibiotic Note  Jason Buchanan is a 37 y.o. male admitted on 01/13/2023 with  hand wound .  Pharmacy has been consulted for Unasyn dosing.  Plan: Unasyn 3gm IV q6h No dose adjustments anticipated.  Pharmacy will sign off and monitor peripherally via electronic surveillance software for any changes in renal function or micro data.   Height: 5' 6"$  (167.6 cm) Weight: 70.3 kg (155 lb) IBW/kg (Calculated) : 63.8  Temp (24hrs), Avg:97.7 F (36.5 C), Min:97.5 F (36.4 C), Max:98.1 F (36.7 C)  Recent Labs  Lab 01/10/23 2018 01/13/23 1315 01/13/23 1317 01/13/23 1715 01/13/23 2130 01/14/23 0547 01/15/23 0452  WBC 13.4*  --  13.3*  --   --  16.4* 10.7*  CREATININE 0.65  --  0.39*  --  0.61 0.46* 0.52*  LATICACIDVEN  --  4.1*  --  1.2  --   --   --     Estimated Creatinine Clearance: 115.2 mL/min (A) (by C-G formula based on SCr of 0.52 mg/dL (L)).    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 Had prolonged block (>30 min) after succinylcholine for GI procedure.    Chlorhexidine Other (See Comments)    Pt does not remember reaction    Naproxen Other (See Comments)    Stomach upset    Thank you for allowing pharmacy to be a part of this patient's care.  Netta Cedars PharmD 01/16/2023 12:30 AM

## 2023-01-16 NOTE — TOC Transition Note (Addendum)
Transition of Care [facility]) - CM/SW Discharge Note   Patient Details  Name: Jason Buchanan MRN: [id number] Date of Birth: 30-Sep-1986  [facility]) CM/SW Contact:  Vassie Moselle, LCSW Phone Number: 01/16/2023, 12:27 PM   Clinical Narrative:    Met with pt who shares he does not have the income to afford medications and does not have the money to afford co-pays. Pt agreeable to [facility] voucher and understands this program can only be used 1x per 12 month period. MATCH does not cover controlled substances. Pt made aware that pain medications will cost $12. Pt unsure if he will be able to afford these.  Pt reports he cannot carry his belongings due to injured arm. Taxi voucher provided to RN to use for transportation at discharge. Ride waiver signed by pt and placed in chart.    Final next level of care: Homeless Shelter Barriers to Discharge: No Barriers Identified   Patient Goals and CMS Choice CMS Medicare.gov Compare Post Acute Care list provided to:: Patient Choice offered to / list presented to : Patient  Discharge Placement                         Discharge Plan and Services Additional resources added to the After Visit Summary for   In-house Referral: Clinical Social Work Discharge Planning Services: [facility], Medication Assistance, CM Consult, Follow-up appt scheduled Post Acute Care Choice: NA          DME Arranged: N/A DME Agency: NA                  Social Determinants of Health (SDOH) Interventions SDOH Screenings   Food Insecurity: No Food Insecurity (01/13/2023)  Housing: High Risk (01/13/2023)  Transportation Needs: Unmet Transportation Needs (01/13/2023)  Utilities: Not At Risk (01/15/2023)  Recent Concern: Utilities - At Risk (01/13/2023)  Tobacco Use: High Risk (01/14/2023)     Readmission Risk Interventions    01/15/2023   12:02 PM  Readmission Risk Prevention Plan  Transportation Screening Complete  PCP or  Specialist Appt within 3-5 Days Complete  HRI or [place] Complete  Social Work Consult for [place] Planning/Counseling Complete  Palliative Care Screening Not Applicable  Medication Review Press photographer) Complete

## 2023-01-17 NOTE — Discharge Summary (Signed)
Physician Discharge Summary   Patient: Jason Buchanan MRN: [id number] DOB: [birth year]  Admit date:     01/13/2023  Discharge date: 01/16/2023  Discharge Physician: Vernelle Emerald   PCP: Patient, No Pcp Per   Recommendations at discharge:   Please take all prescribed antibiotics exactly as instructed And new primary care provider appointment has been arranged for you.  Please follow-up.  Also, please contact [facility] hand surgery clinic to confirm your appointment on Thursday. Leave your left hand dressing on until you follow-up with hand surgery clinic on 2/15.  You may cover the hand with a bag if showering. Please return to the emergency department if you develop worsening pain, fevers or weakness Please abstain from using any illicit substances such as methamphetamines or for narcotics.  Please use the attached resources to find options for rehabilitation in the area.  Discharge Diagnoses: Principal Problem:   Abscess of left hand Active Problems:   Sepsis due to cellulitis (HCC)   Lactic acidosis   Hypokalemia   Polysubstance abuse (HCC)   IVDU (intravenous drug user)   Alcohol use   Elevated transaminase level   Mood disorder (HCC)   Sepsis ([place])  Resolved Problems:   * No resolved hospital problems. 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  Upon evaluation in the emergency department patient was found to exhibit multiple SIRS criteria concerning for sepsis along with clinical evidence of cellulitis and abscess of the left hand.  X-rays  performed revealing erosive changes involving multiple carpal bones with diffuse intercarpal and radiocarpal joint space narrowing.  EDP discussed case with Dr. Apolonio Schneiders with hand surgery who evaluated the case and in consultation and the patient for left hand incision drainage and debridement the afternoon of 2/10.  The hospitalist group was also called to assess the patient for admission the hospital.  For patient's current sepsis and marked lactic acidosis with lactate of 4.1, patient was placed on intravenous vancomycin and Rocephin.  Patient was aggressively hydrated with intravenous isotonic fluids.  Blood cultures were obtained.  Concerning patient's active polysubstance abuse including intravenous drug abuse with opiates patient was monitored for evidence of withdrawal throughout the hospitalization.  Due to patient's significant pain status post operative intervention patient was provided with judicious doses of opiate based analgesics throughout the hospitalization.  Intraoperative cultures ended up growing out Streptococcus intermedius.  Patient patient clinically improved over the next several days along with resolving lactic acidosis and improving pain.  Patient  was eventually transitioned to a home-going regimen of oral Augmentin and doxycycline to complete his antibiotic course.  Patient was discharged home in improved and stable condition on 01/16/2023.    Pain control - [employer] Controlled Substance Reporting System database was reviewed. and patient was instructed, not to drive, operate heavy machinery, perform activities at heights, swimming or participation in water activities or provide baby-sitting services while on Pain, Sleep and Anxiety Medications; until their outpatient Physician has advised to do so again. Also recommended to not to take more than prescribed Pain, Sleep and Anxiety Medications.   Consultants: Dr. Caralyn Guile with Hand Surgery Procedures performed: Left hand  incision and drainage, left  hand tenosynovectomy of the third dorsal compartment and left hand tenosynovectomy of the second dorsal compartment. 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 Had prolonged block (>30 min) after succinylcholine for GI procedure.    Chlorhexidine Other (See Comments)   Pt does not remember reaction   Naproxen Other (See Comments)   Stomach upset        Medication List     TAKE these medications    amoxicillin-clavulanate 875-125 MG tablet Commonly known as: AUGMENTIN Take 1 tablet by mouth 2 times daily for 8 days.   doxycycline 100 MG capsule Commonly known as: VIBRAMYCIN Take 1 capsule (100 mg) by mouth 2 times daily for 8 days.   gabapentin 400 MG capsule Commonly known as: NEURONTIN Take 1 capsule (400 mg total) by mouth 3 (three) times daily.   methocarbamol 500 MG tablet Commonly known as: ROBAXIN Take 500 mg by mouth every 8 (eight) hours as needed for muscle spasms.   naloxone 4 MG/0.1ML Liqd nasal spray kit Commonly known as: NARCAN Take as needed for Narcotic overdose.   naloxone 4 MG/0.1ML Liqd nasal spray kit Commonly known as: NARCAN Take in case of overdose, call 911   Oxycodone HCl 10 MG Tabs Take 1 tablet (10 mg) by mouth every 6 hours as needed for up to 3 days for severe pain.   pantoprazole 40 MG tablet Commonly known as: PROTONIX Take 1 tablet (40 mg total) by mouth daily.   polyethylene glycol powder 17 GM/SCOOP powder Commonly known as: MiraLax Take 17 g by  mouth daily as needed (constipation).   QUEtiapine 300 MG 24 hr tablet Commonly known as: SEROQUEL XR Take 1 tablet (300 mg total) by mouth at bedtime.   senna 8.6 MG Tabs tablet Commonly known as: SENOKOT Take 2 tablets (17.2 mg total) by mouth at bedtime.               Discharge Care Instructions  (From admission, onward)           Start     Ordered   01/16/23 0000  Leave dressing on - Keep it clean, dry, and intact until clinic visit        01/16/23 1104            Follow-up [place]. Go on 01/19/2023.   Specialty: Internal Medicine Why: You have an appointment to establish primary care services on Friday, 01/19/2023 at 2:20pm. Please arrive 15-20 minutes early to complete new patient paperwork. Contact information: [place] [id number] [place]        Iran Planas, MD. Go on 01/18/2023.   Specialty: Orthopedic Surgery Why: Please call to confirm appointment Contact information: [address] [place] [address] [id number]                 Discharge Exam: Danley Danker Weights  01/13/23 1323 01/13/23 1825 01/13/23 2011  Weight: 56.7 kg 76.4 kg 70.3 kg    Constitutional: Awake alert and oriented x3, no associated distress.   Respiratory: clear to auscultation bilaterally, no wheezing, no crackles. Normal respiratory effort. No accessory muscle use.  Cardiovascular: Regular rate and rhythm, no murmurs / rubs / gallops. No extremity edema. 2+ pedal pulses. No carotid bruits.  Abdomen: Abdomen is soft and nontender.  No evidence of intra-abdominal masses.  Positive bowel sounds noted in all quadrants.   Musculoskeletal: Left hand dressing is clean dry and intact.  Patient continues to experience pain with movement of the digits.    Condition at discharge: fair  The results of significant diagnostics from this hospitalization (including imaging,  microbiology, ancillary and laboratory) are listed below for reference.   Imaging Studies: DG Hand Complete Left  Result Date: 01/13/2023 CLINICAL DATA:  Left hand abscess.  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 No progressive erosion or bone loss at this location compared to prior. No sites of erosion or periosteal elevation or seen within the hand. No fracture or dislocation. Diffuse soft tissue swelling. No soft tissue gas. IMPRESSION: 1. Diffuse soft tissue swelling. No evidence to suggest acute osteomyelitis of the left hand. 2. 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 Electronically Signed   By: Davina Poke D.O.   On: 01/13/2023 14:15   DG Chest Portable 1 View  Result Date: 01/10/2023 CLINICAL DATA:  Overdose.  Unresponsive EXAM: PORTABLE CHEST 1 VIEW COMPARISON:  01/24/2019 FINDINGS: Normal heart size and pulmonary vascularity. No focal airspace disease or consolidation in the lungs. No blunting of costophrenic angles. No pneumothorax. Mediastinal contours appear intact. Shallow inspiration. Old rib fractures. IMPRESSION: No active disease. Electronically Signed   By: Lucienne Capers M.D.   On: 01/10/2023 19:24    Microbiology: Results for orders placed or performed during the hospital encounter of 01/13/23  Blood Culture (routine x 2)     Status: None (Preliminary result)   Collection Time: 01/13/23  1:15 PM   Specimen: BLOOD  Result Value Ref Range Status   Specimen Description   Final    BLOOD BLOOD RIGHT FOREARM Performed at [facility] [address] [place], [place] [address]    Special Requests   Final    BOTTLES DRAWN AEROBIC ONLY Blood Culture adequate volume Performed at [facility] [address] [place], [place] [address]    Culture   Final    NO GROWTH 3 DAYS Performed at [facility], [place] [address], [place], [place] [address]    Report Status PENDING  Incomplete  Blood Culture (routine x 2)     Status: None (Preliminary result)   Collection Time: 01/13/23  1:35 PM   Specimen: BLOOD  Result Value Ref Range Status   Specimen Description   Final    BLOOD BLOOD RIGHT HAND Performed at [facility] [address] [place], [place] [address]    Special Requests   Final    BOTTLES DRAWN AEROBIC ONLY Blood Culture adequate volume Performed at [facility]  [address] [place], [place] [address]    Culture   Final    NO GROWTH 3 DAYS Performed at [facility], [place] [address], [place], [place] [address]    Report Status PENDING  Incomplete  Aerobic/Anaerobic Culture w Gram Stain (surgical/deep wound)     Status: None (Preliminary result)   Collection Time: 01/13/23  4:18 PM   Specimen: PATH Other; Tissue  Result Value Ref Range Status   Specimen Description   Final    TISSUE Performed at [facility], [place] [address], [place], [place] [address]    Special Requests   Final    NONE Performed at [facility], [place] [address] [place], [place] [address]    Gram Stain   Final    ABUNDANT WBC PRESENT,BOTH PMN AND MONONUCLEAR MODERATE GRAM POSITIVE COCCI Performed at [facility], [place] [address], [place], [place] [address]    Culture   Final    MODERATE STREPTOCOCCUS INTERMEDIUS NO ANAEROBES ISOLATED; CULTURE IN PROGRESS FOR 5 DAYS    Report Status PENDING  Incomplete   Organism ID, Bacteria STREPTOCOCCUS INTERMEDIUS  Final      Susceptibility   Streptococcus intermedius -  MIC*    PENICILLIN <=0.06 SENSITIVE Sensitive     CEFTRIAXONE <=0.12 SENSITIVE Sensitive     ERYTHROMYCIN >=8 RESISTANT Resistant     LEVOFLOXACIN <=0.25 SENSITIVE Sensitive     VANCOMYCIN 0.25 SENSITIVE Sensitive     * MODERATE STREPTOCOCCUS INTERMEDIUS  MRSA Next Gen by PCR, Nasal     Status: Abnormal   Collection Time: 01/15/23 10:23 AM   Specimen: Nasal Mucosa; Nasal Swab  Result Value Ref Range Status   MRSA by PCR Next Gen DETECTED (A) NOT DETECTED Final    Comment: RESULT CALLED TO, READ BACK BY AND VERIFIED WITH: RN Ruffin Pyo AT [id number] 01/15/23 CRUICKSHANK A (NOTE) The GeneXpert MRSA Assay (FDA approved for NASAL specimens only), is one component of a comprehensive MRSA colonization surveillance program. It is not intended to diagnose MRSA infection nor to guide or monitor treatment for MRSA infections. Test performance is not FDA approved in patients less than 32 years old. Performed at [facility], [place] [address] [place],  [address]     Labs: CBC: Recent Labs  Lab 01/10/23 2018 01/13/23 1317 01/14/23 0547 01/15/23 0452 01/16/23 0524  WBC 13.4* 13.3* 16.4* 10.7* 9.5  NEUTROABS 10.7* 9.9*  --  5.8 4.4  HGB 13.9 12.1* 13.1 12.9* 14.6  HCT 41.7 37.5* 41.5 39.9 45.0  MCV 90.5 92.6 95.0 92.1 91.3  PLT 290 292 330 346 [id number]   Basic Metabolic Panel: Recent Labs  Lab 01/13/23 1317 01/13/23 1715 01/13/23 2130 01/14/23 0547 01/15/23 0452 01/16/23 0524  NA 138  --  142 138 140 135  K 2.4*  --  4.0 3.9 3.3* 4.0  CL 109  --  109 107 108 102  CO2 18*  --  23 19* 25 22  GLUCOSE 127*  --  143* 128* 113* 100*  BUN 8  --  8 10 14 $ 21*  CREATININE 0.39*  --  0.61 0.46* 0.52* 0.34*  CALCIUM 8.5*  --  8.6* 8.7* 8.5* 8.5*  MG  --  1.6*  --  2.1 1.9 2.1   Liver Function Tests: Recent Labs  Lab 01/10/23 2018 01/13/23 1317 01/14/23 0547 01/15/23 0452 01/16/23 0524  AST 82* 33 35 28 33  ALT 72* 47* 44 40 42  ALKPHOS 158*  127* 131* 116 142*   BILITOT 1.1 0.4 0.4 0.4 0.3  PROT 6.9 6.7 6.7 6.6 6.9  ALBUMIN 3.7 3.2* 3.2* 3.2* 3.3*   CBG: Recent Labs  Lab 01/10/23 1823  GLUCAP 123*    Discharge time spent: greater than 30 minutes.  Signed: Vernelle Emerald, MD [facility] 01/17/2023

## 2023-01-18 LAB — CULTURE, BLOOD (ROUTINE X 2)
Culture: NO GROWTH
Culture: NO GROWTH
Special Requests: ADEQUATE
Special Requests: ADEQUATE

## 2023-01-19 ENCOUNTER — Ambulatory Visit: Payer: Self-pay | Admitting: Nurse Practitioner

## 2023-01-19 LAB — AEROBIC/ANAEROBIC CULTURE W GRAM STAIN (SURGICAL/DEEP WOUND)

## 2023-01-28 ENCOUNTER — Emergency Department (HOSPITAL_COMMUNITY): Payer: Self-pay

## 2023-01-28 ENCOUNTER — Inpatient Hospital Stay (HOSPITAL_COMMUNITY)
Admission: EM | Admit: 2023-01-28 | Discharge: 2023-02-06 | DRG: 603 | Disposition: A | Discharge status: Skilled Nursing Facility | Payer: Self-pay | Attending: Internal Medicine | Admitting: Internal Medicine

## 2023-01-28 ENCOUNTER — Other Ambulatory Visit: Payer: Self-pay

## 2023-01-28 DIAGNOSIS — Z91030 Bee allergy status: Secondary | ICD-10-CM

## 2023-01-28 DIAGNOSIS — R410 Disorientation, unspecified: Secondary | ICD-10-CM | POA: Diagnosis present

## 2023-01-28 DIAGNOSIS — F1510 Other stimulant abuse, uncomplicated: Secondary | ICD-10-CM | POA: Diagnosis present

## 2023-01-28 DIAGNOSIS — F419 Anxiety disorder, unspecified: Secondary | ICD-10-CM | POA: Diagnosis present

## 2023-01-28 DIAGNOSIS — D6489 Other specified anemias: Secondary | ICD-10-CM | POA: Diagnosis present

## 2023-01-28 DIAGNOSIS — J45909 Unspecified asthma, uncomplicated: Secondary | ICD-10-CM | POA: Diagnosis present

## 2023-01-28 DIAGNOSIS — Z8619 Personal history of other infectious and parasitic diseases: Secondary | ICD-10-CM

## 2023-01-28 DIAGNOSIS — Z883 Allergy status to other anti-infective agents status: Secondary | ICD-10-CM

## 2023-01-28 DIAGNOSIS — Z91148 Patient's other noncompliance with medication regimen for other reason: Secondary | ICD-10-CM

## 2023-01-28 DIAGNOSIS — F1120 Opioid dependence, uncomplicated: Secondary | ICD-10-CM | POA: Diagnosis present

## 2023-01-28 DIAGNOSIS — L02512 Cutaneous abscess of left hand: Secondary | ICD-10-CM | POA: Diagnosis present

## 2023-01-28 DIAGNOSIS — R413 Other amnesia: Secondary | ICD-10-CM | POA: Diagnosis present

## 2023-01-28 DIAGNOSIS — G8929 Other chronic pain: Secondary | ICD-10-CM | POA: Diagnosis present

## 2023-01-28 DIAGNOSIS — Z79899 Other long term (current) drug therapy: Secondary | ICD-10-CM

## 2023-01-28 DIAGNOSIS — M659 Synovitis and tenosynovitis, unspecified: Secondary | ICD-10-CM | POA: Diagnosis present

## 2023-01-28 DIAGNOSIS — E876 Hypokalemia: Secondary | ICD-10-CM | POA: Diagnosis present

## 2023-01-28 DIAGNOSIS — F1011 Alcohol abuse, in remission: Secondary | ICD-10-CM | POA: Diagnosis present

## 2023-01-28 DIAGNOSIS — F17210 Nicotine dependence, cigarettes, uncomplicated: Secondary | ICD-10-CM | POA: Diagnosis present

## 2023-01-28 DIAGNOSIS — E871 Hypo-osmolality and hyponatremia: Secondary | ICD-10-CM | POA: Diagnosis present

## 2023-01-28 DIAGNOSIS — F1594 Other stimulant use, unspecified with stimulant-induced mood disorder: Secondary | ICD-10-CM | POA: Diagnosis present

## 2023-01-28 DIAGNOSIS — F209 Schizophrenia, unspecified: Secondary | ICD-10-CM | POA: Diagnosis present

## 2023-01-28 DIAGNOSIS — Z5900 Homelessness unspecified: Secondary | ICD-10-CM

## 2023-01-28 DIAGNOSIS — F23 Brief psychotic disorder: Secondary | ICD-10-CM | POA: Diagnosis present

## 2023-01-28 DIAGNOSIS — Z888 Allergy status to other drugs, medicaments and biological substances status: Secondary | ICD-10-CM

## 2023-01-28 DIAGNOSIS — F29 Unspecified psychosis not due to a substance or known physiological condition: Principal | ICD-10-CM

## 2023-01-28 DIAGNOSIS — F1990 Other psychoactive substance use, unspecified, uncomplicated: Secondary | ICD-10-CM | POA: Diagnosis present

## 2023-01-28 DIAGNOSIS — L03114 Cellulitis of left upper limb: Principal | ICD-10-CM | POA: Diagnosis present

## 2023-01-28 DIAGNOSIS — F32A Depression, unspecified: Secondary | ICD-10-CM | POA: Diagnosis present

## 2023-01-28 DIAGNOSIS — Z886 Allergy status to analgesic agent status: Secondary | ICD-10-CM

## 2023-01-28 DIAGNOSIS — F1210 Cannabis abuse, uncomplicated: Secondary | ICD-10-CM | POA: Diagnosis present

## 2023-01-28 DIAGNOSIS — F1910 Other psychoactive substance abuse, uncomplicated: Secondary | ICD-10-CM | POA: Diagnosis present

## 2023-01-28 LAB — CBC WITH DIFFERENTIAL/PLATELET
Abs Immature Granulocytes: 0.04 10*3/uL (ref 0.00–0.07)
Basophils Absolute: 0.1 10*3/uL (ref 0.0–0.1)
Basophils Relative: 1 %
Eosinophils Absolute: 0.6 10*3/uL — ABNORMAL HIGH (ref 0.0–0.5)
Eosinophils Relative: 6 %
HCT: 39.5 % (ref 39.0–52.0)
Hemoglobin: 12.9 g/dL — ABNORMAL LOW (ref 13.0–17.0)
Immature Granulocytes: 0 %
Lymphocytes Relative: 31 %
Lymphs Abs: 3.1 10*3/uL (ref 0.7–4.0)
MCH: 28.7 pg (ref 26.0–34.0)
MCHC: 32.7 g/dL (ref 30.0–36.0)
MCV: 87.8 fL (ref 80.0–100.0)
Monocytes Absolute: 1.3 10*3/uL — ABNORMAL HIGH (ref 0.1–1.0)
Monocytes Relative: 13 %
Neutro Abs: 5 10*3/uL (ref 1.7–7.7)
Neutrophils Relative %: 49 %
Platelets: 429 10*3/uL — ABNORMAL HIGH (ref 150–400)
RBC: 4.5 MIL/uL (ref 4.22–5.81)
RDW: 11.9 % (ref 11.5–15.5)
WBC: 10.1 10*3/uL (ref 4.0–10.5)
nRBC: 0 % (ref 0.0–0.2)

## 2023-01-28 LAB — COMPREHENSIVE METABOLIC PANEL
ALT: 38 U/L (ref 0–44)
AST: 41 U/L (ref 15–41)
Albumin: 4 g/dL (ref 3.5–5.0)
Alkaline Phosphatase: 110 U/L (ref 38–126)
Anion gap: 10 (ref 5–15)
BUN: 11 mg/dL (ref 6–20)
CO2: 22 mmol/L (ref 22–32)
Calcium: 8.9 mg/dL (ref 8.9–10.3)
Chloride: 100 mmol/L (ref 98–111)
Creatinine, Ser: 0.56 mg/dL — ABNORMAL LOW (ref 0.61–1.24)
GFR, Estimated: >60 mL/min (ref >60)
Glucose, Bld: 84 mg/dL (ref 70–99)
Potassium: 3.4 mmol/L — ABNORMAL LOW (ref 3.5–5.1)
Sodium: 132 mmol/L — ABNORMAL LOW (ref 135–145)
Total Bilirubin: 1.3 mg/dL — ABNORMAL HIGH (ref 0.3–1.2)
Total Protein: 7 g/dL (ref 6.5–8.1)

## 2023-01-28 LAB — URINALYSIS, ROUTINE W REFLEX MICROSCOPIC
Bacteria, UA: NONE SEEN
Bilirubin Urine: NEGATIVE
Glucose, UA: NEGATIVE mg/dL
Hgb urine dipstick: NEGATIVE
Ketones, ur: 20 mg/dL — AB
Leukocytes,Ua: NEGATIVE
Nitrite: NEGATIVE
Protein, ur: 30 mg/dL — AB
Specific Gravity, Urine: 1.019 (ref 1.005–1.030)
pH: 6 (ref 5.0–8.0)

## 2023-01-28 LAB — LACTIC ACID, PLASMA: Lactic Acid, Venous: 1.4 mmol/L (ref 0.5–1.9)

## 2023-01-28 LAB — POTASSIUM: Potassium: 3.2 mmol/L — ABNORMAL LOW (ref 3.5–5.1)

## 2023-01-28 LAB — RAPID URINE DRUG SCREEN, HOSP PERFORMED
Amphetamines: POSITIVE — AB
Barbiturates: NOT DETECTED
Benzodiazepines: NOT DETECTED
Cocaine: NOT DETECTED
Opiates: NOT DETECTED
Tetrahydrocannabinol: POSITIVE — AB

## 2023-01-28 LAB — MAGNESIUM: Magnesium: 1.8 mg/dL (ref 1.7–2.4)

## 2023-01-28 MED ORDER — DIAZEPAM 5 MG/ML IJ SOLN
5.0000 mg | Freq: Once | INTRAMUSCULAR | Status: AC
  Administered 2023-01-28: 5 mg via INTRAVENOUS
  Filled 2023-01-28: qty 2

## 2023-01-28 MED ORDER — PANTOPRAZOLE SODIUM 40 MG PO TBEC
40.0000 mg | DELAYED_RELEASE_TABLET | Freq: Every day | ORAL | Status: DC
  Administered 2023-01-28 – 2023-02-05 (×9): 40 mg via ORAL
  Filled 2023-01-28 (×9): qty 1

## 2023-01-28 MED ORDER — DEXMEDETOMIDINE HCL IN NACL 400 MCG/100ML IV SOLN
0.0000 ug/kg/h | INTRAVENOUS | Status: DC
  Administered 2023-01-28: 0.4 ug/kg/h via INTRAVENOUS
  Administered 2023-01-28 – 2023-01-29 (×2): 0.6 ug/kg/h via INTRAVENOUS
  Administered 2023-01-29: 0.4 ug/kg/h via INTRAVENOUS
  Filled 2023-01-28 (×4): qty 100

## 2023-01-28 MED ORDER — HALOPERIDOL LACTATE 5 MG/ML IJ SOLN
1.0000 mg | INTRAMUSCULAR | Status: DC | PRN
  Administered 2023-01-29: 2 mg via INTRAVENOUS
  Administered 2023-01-30: 4 mg via INTRAVENOUS
  Administered 2023-01-30: 2 mg via INTRAVENOUS
  Administered 2023-01-30: 4 mg via INTRAVENOUS
  Administered 2023-01-31 (×3): 2 mg via INTRAVENOUS
  Administered 2023-01-31 (×2): 3 mg via INTRAVENOUS
  Administered 2023-02-01 (×5): 4 mg via INTRAVENOUS
  Administered 2023-02-01: 2 mg via INTRAVENOUS
  Administered 2023-02-02 (×3): 1 mg via INTRAVENOUS
  Administered 2023-02-02: 4 mg via INTRAVENOUS
  Administered 2023-02-02 – 2023-02-03 (×4): 1 mg via INTRAVENOUS
  Administered 2023-02-03: 4 mg via INTRAVENOUS
  Filled 2023-01-28 (×24): qty 1

## 2023-01-28 MED ORDER — CLONIDINE HCL 0.1 MG PO TABS
0.1000 mg | ORAL_TABLET | Freq: Once | ORAL | Status: AC
  Administered 2023-01-28: 0.1 mg via ORAL
  Filled 2023-01-28: qty 1

## 2023-01-28 MED ORDER — SODIUM CHLORIDE 0.9 % IV SOLN
2.0000 g | Freq: Three times a day (TID) | INTRAVENOUS | Status: DC
  Administered 2023-01-28 – 2023-01-31 (×9): 2 g via INTRAVENOUS
  Filled 2023-01-28 (×9): qty 12.5

## 2023-01-28 MED ORDER — DOCUSATE SODIUM 100 MG PO CAPS: 100.0000 mg | ORAL_CAPSULE | Freq: Two times a day (BID) | ORAL | Status: DC | PRN

## 2023-01-28 MED ORDER — ONDANSETRON HCL 4 MG/2ML IJ SOLN
4.0000 mg | Freq: Four times a day (QID) | INTRAMUSCULAR | Status: DC | PRN
  Administered 2023-01-30: 4 mg via INTRAVENOUS
  Filled 2023-01-28: qty 2

## 2023-01-28 MED ORDER — POTASSIUM CHLORIDE 10 MEQ/100ML IV SOLN
10.0000 meq | INTRAVENOUS | Status: AC
  Administered 2023-01-29 (×5): 10 meq via INTRAVENOUS
  Filled 2023-01-28 (×5): qty 100

## 2023-01-28 MED ORDER — MUPIROCIN 2 % EX OINT
1.0000 | TOPICAL_OINTMENT | Freq: Two times a day (BID) | CUTANEOUS | Status: AC
  Administered 2023-01-28 – 2023-02-01 (×9): 1 via NASAL
  Filled 2023-01-28 (×4): qty 22

## 2023-01-28 MED ORDER — SODIUM CHLORIDE 0.9 % IV BOLUS
1000.0000 mL | Freq: Once | INTRAVENOUS | Status: AC
  Administered 2023-01-28: 1000 mL via INTRAVENOUS

## 2023-01-28 MED ORDER — FOLIC ACID 5 MG/ML IJ SOLN
1.0000 mg | Freq: Every day | INTRAMUSCULAR | Status: DC
  Administered 2023-01-28: 1 mg via INTRAVENOUS
  Filled 2023-01-28 (×2): qty 0.2

## 2023-01-28 MED ORDER — QUETIAPINE FUMARATE ER 300 MG PO TB24
300.0000 mg | ORAL_TABLET | Freq: Every day | ORAL | Status: DC
  Administered 2023-01-28 – 2023-02-05 (×9): 300 mg via ORAL
  Filled 2023-01-28 (×11): qty 1

## 2023-01-28 MED ORDER — LACTATED RINGERS IV SOLN: INTRAVENOUS | Status: DC

## 2023-01-28 MED ORDER — MORPHINE SULFATE (PF) 4 MG/ML IV SOLN: 4.0000 mg | Freq: Once | INTRAVENOUS | Status: DC

## 2023-01-28 MED ORDER — SENNA 8.6 MG PO TABS
2.0000 | ORAL_TABLET | Freq: Every day | ORAL | Status: DC
  Administered 2023-01-28 – 2023-02-04 (×8): 17.2 mg via ORAL
  Filled 2023-01-28 (×9): qty 2

## 2023-01-28 MED ORDER — POLYETHYLENE GLYCOL 3350 17 G PO PACK: 17.0000 g | PACK | Freq: Every day | ORAL | Status: DC | PRN

## 2023-01-28 MED ORDER — THIAMINE HCL 100 MG/ML IJ SOLN
100.0000 mg | Freq: Every day | INTRAMUSCULAR | Status: DC
  Administered 2023-01-28 – 2023-01-29 (×2): 100 mg via INTRAVENOUS
  Filled 2023-01-28 (×2): qty 2

## 2023-01-28 MED ORDER — LACTATED RINGERS IV BOLUS
1000.0000 mL | Freq: Once | INTRAVENOUS | Status: AC
  Administered 2023-01-28: 1000 mL via INTRAVENOUS

## 2023-01-28 MED ORDER — MAGNESIUM SULFATE 2 GM/50ML IV SOLN
2.0000 g | Freq: Once | INTRAVENOUS | Status: AC
  Administered 2023-01-29: 2 g via INTRAVENOUS
  Filled 2023-01-28: qty 50

## 2023-01-28 MED ORDER — LACTATED RINGERS BOLUS PEDS
500.0000 mL | Freq: Once | INTRAVENOUS | Status: AC
  Administered 2023-01-28: 500 mL via INTRAVENOUS

## 2023-01-28 MED ORDER — STERILE WATER FOR INJECTION IJ SOLN
INTRAMUSCULAR | Status: AC
  Administered 2023-01-28: 10 mL
  Filled 2023-01-28: qty 10

## 2023-01-28 MED ORDER — VANCOMYCIN HCL 1250 MG/250ML IV SOLN
1250.0000 mg | Freq: Once | INTRAVENOUS | Status: AC
  Administered 2023-01-28: 1250 mg via INTRAVENOUS
  Filled 2023-01-28: qty 250

## 2023-01-28 MED ORDER — DIAZEPAM 5 MG/ML IJ SOLN: 5.0000 mg | Freq: Once | INTRAMUSCULAR | Status: DC

## 2023-01-28 MED ORDER — LORAZEPAM 1 MG PO TABS
1.0000 mg | ORAL_TABLET | Freq: Once | ORAL | Status: AC
  Administered 2023-01-28: 1 mg via ORAL
  Filled 2023-01-28: qty 1

## 2023-01-28 MED ORDER — ACETAMINOPHEN 325 MG PO TABS
650.0000 mg | ORAL_TABLET | ORAL | Status: DC | PRN
  Administered 2023-01-29 – 2023-02-01 (×5): 650 mg via ORAL
  Filled 2023-01-28 (×5): qty 2

## 2023-01-28 MED ORDER — ZIPRASIDONE MESYLATE 20 MG IM SOLR
20.0000 mg | Freq: Once | INTRAMUSCULAR | Status: AC
  Administered 2023-01-28: 20 mg via INTRAMUSCULAR
  Filled 2023-01-28: qty 20

## 2023-01-28 MED ORDER — SODIUM CHLORIDE 0.9 % IV SOLN
2.0000 g | Freq: Once | INTRAVENOUS | Status: AC
  Administered 2023-01-28: 2 g via INTRAVENOUS
  Filled 2023-01-28: qty 12.5

## 2023-01-28 MED ORDER — ENOXAPARIN SODIUM 40 MG/0.4ML IJ SOSY
40.0000 mg | PREFILLED_SYRINGE | INTRAMUSCULAR | Status: DC
  Administered 2023-01-28 – 2023-02-05 (×9): 40 mg via SUBCUTANEOUS
  Filled 2023-01-28 (×9): qty 0.4

## 2023-01-28 MED ORDER — LORAZEPAM 1 MG PO TABS: 2.0000 mg | ORAL_TABLET | Freq: Once | ORAL | Status: DC

## 2023-01-28 NOTE — ED Notes (Signed)
Pt is very agitated at this time. Unable to collect labs.

## 2023-01-28 NOTE — ED Provider Notes (Addendum)
[place] [facility] Provider Note   CSN: [id number] Arrival date & time: 01/28/23  0043     History  Chief Complaint  Patient presents with   Abscess   Withdrawal    Jason Buchanan is a 37 y.o. male.  With history of polysubstance abuse including IV drug use, sepsis due to cellulitis, asthma, schizophrenia who presents to the ED for evaluation of left hand pain.  He states he was recently admitted and had a procedure on his left hand to drain an abscess.  The wound was left open and he was given IV antibiotics.  He states was discharged on Bactrim and Augmentin.  He reports compliance with these medications and has finished his course.  States that the wound has never closed but has remained painful.  He reports daily heroin and fentanyl use.  Last use was yesterday morning.  He uses by injecting into bilateral hands.  He injected into the left wrist 2 days ago.  States he occasionally uses methamphetamine as well.  Denies alcohol use or other recreational drug use.  Also reports running out of all of his home medications 3 days ago including Seroquel.  He reports some mild chills for the past 2 weeks but denies fevers or rigors.  Denies nausea, vomiting, abdominal pain.  He states that he was given 10 Percocet pills after discharge 2 weeks ago.  As soon as his Percocet ran out he went back to using injection fentanyl for the pain.  Pain in his left hand has never gotten better.  It has stayed approximately the same.   Abscess      Home Medications Prior to Admission medications   Medication Sig Start Date End Date Taking? Authorizing Provider  gabapentin (NEURONTIN) 400 MG capsule Take 1 capsule (400 mg total) by mouth 3 (three) times daily. 01/17/22 01/14/23  Donne Hazel, MD  methocarbamol (ROBAXIN) 500 MG tablet Take 500 mg by mouth every 8 (eight) hours as needed for muscle spasms.    [provider]  naloxone [facility]) nasal spray 4  mg/0.1 mL Take as needed for Narcotic overdose. 12/12/22   Tretha Sciara, MD  naloxone [facility]) nasal spray 4 mg/0.1 mL Take in case of overdose, call 911 01/10/23   Wynona Dove A, DO  pantoprazole (PROTONIX) 40 MG tablet Take 1 tablet (40 mg total) by mouth daily. 01/18/22 01/28/23  Donne Hazel, MD  polyethylene glycol powder [facility]) 17 GM/SCOOP powder Take 17 g by mouth daily as needed (constipation). 01/16/23   Shalhoub, Sherryll Burger, MD  QUEtiapine (SEROQUEL XR) 300 MG 24 hr tablet Take 1 tablet (300 mg total) by mouth at bedtime. 01/17/22 01/28/23  Donne Hazel, MD  senna (SENOKOT) 8.6 MG TABS tablet Take 2 tablets (17.2 mg total) by mouth at bedtime. 01/16/23   Shalhoub, Sherryll Burger, MD      Allergies    Bee venom, Acetaminophen, Succinylcholine, Chlorhexidine, and Naproxen    Review of Systems   Review of Systems  Constitutional:  Positive for chills.  Musculoskeletal:  Positive for myalgias.  All other systems reviewed and are negative.   Physical Exam Updated Vital Signs BP 98/66   Pulse 83   Temp 97.6 F (36.4 C) (Axillary)   Resp 17   Ht '5\' 6"'$  (1.676 m)   Wt 70.3 kg   SpO2 100%   BMI 25.02 kg/m  Physical Exam Vitals and nursing note reviewed.  Constitutional:  Appearance: He is well-developed. He is ill-appearing.     Comments: Significantly hyperactive.  Unable to sit still in bed  HENT:     Head: Normocephalic and atraumatic.  Eyes:     Conjunctiva/sclera: Conjunctivae normal.  Cardiovascular:     Rate and Rhythm: Regular rhythm. Tachycardia present.     Heart sounds: No murmur heard. Pulmonary:     Effort: Pulmonary effort is normal. No respiratory distress.     Breath sounds: Normal breath sounds. No wheezing, rhonchi or rales.  Abdominal:     Palpations: Abdomen is soft.     Tenderness: There is no abdominal tenderness.  Musculoskeletal:        General: No swelling.     Cervical back: Neck supple.  Skin:    General: Skin is warm and dry.      Capillary Refill: Capillary refill takes less than 2 seconds.     Comments: Open wound without purulent drainage to the left dorsum of the hand overlying the previous surgical incision and drainage site with 3 Prolene sutures intact.  Surrounding erythema.  Fully healed surgical scars to bilateral dorsum of the hands and wrists  Neurological:     Mental Status: He is alert.  Psychiatric:        Attention and Perception: He is inattentive.        Mood and Affect: Mood is anxious.        Behavior: Behavior is hyperactive.     ED Results / Procedures / Treatments   Labs (all labs ordered are listed, but only abnormal results are displayed) Labs Reviewed  COMPREHENSIVE METABOLIC PANEL - Abnormal; Notable for the following components:      Result Value   Sodium 132 (*)    Potassium 3.4 (*)    Creatinine, Ser 0.56 (*)    Total Bilirubin 1.3 (*)    All other components within normal limits  CBC WITH DIFFERENTIAL/PLATELET - Abnormal; Notable for the following components:   Hemoglobin 12.9 (*)    Platelets 429 (*)    Monocytes Absolute 1.3 (*)    Eosinophils Absolute 0.6 (*)    All other components within normal limits  RAPID URINE DRUG SCREEN, HOSP PERFORMED - Abnormal; Notable for the following components:   Amphetamines POSITIVE (*)    Tetrahydrocannabinol POSITIVE (*)    All other components within normal limits  URINALYSIS, ROUTINE W REFLEX MICROSCOPIC - Abnormal; Notable for the following components:   Ketones, ur 20 (*)    Protein, ur 30 (*)    All other components within normal limits  CULTURE, BLOOD (ROUTINE X 2)  CULTURE, BLOOD (ROUTINE X 2)  LACTIC ACID, PLASMA    EKG None  Radiology DG Hand Complete Left  Result Date: 01/28/2023 CLINICAL DATA:  37 year old male history of abscess on the posterior surface of the left hand. Possible infection. Currently detoxing from heroin. EXAM: LEFT HAND - COMPLETE 3+ VIEW COMPARISON:  Left hand radiograph 01/13/2023. FINDINGS:  Extensive soft tissue swelling is again noted in the hand, most evident between the first and second metacarpals where there may now be some gas in the intervening soft tissues no acute displaced fracture or dislocation. Extensive mixed lucency and sclerosis throughout the carpal joints again noted, with advanced degenerative changes at the radiocarpal joint, similar to the prior study. IMPRESSION: 1. Gas in the soft tissues between the left first and second metacarpals, which could be postoperative if there has been recent incision/drainage, or could be indicative of infection  with gas-forming organisms. Clinical correlation is recommended. 2. 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 Electronically Signed   By: Vinnie Langton M.D.   On: 01/28/2023 07:27    Procedures .Critical Care  Performed by: Roylene Reason, PA-C Authorized by: Roylene Reason, PA-C   Critical care provider statement:    Critical care time (minutes):  65   Critical care was necessary to treat or prevent imminent or life-threatening deterioration of the following conditions:  CNS failure or compromise   Critical care was time spent personally by me on the following activities:  Development of treatment plan with patient or surrogate, discussions with consultants, evaluation of patient's response to treatment, examination of patient, ordering and review of laboratory studies, ordering and review of radiographic studies, ordering and performing treatments and interventions, pulse oximetry, re-evaluation of patient's condition and review of old charts   Care discussed with: admitting provider   Comments:     Acute psychosis requiring precedex drip     Medications Ordered in ED Medications  dexmedetomidine (PRECEDEX) 400 MCG/100ML (4 mcg/mL) infusion (0.6 mcg/kg/hr  70.3 kg Intravenous Rate/Dose Change 01/28/23 1427)   vancomycin (VANCOREADY) IVPB 1250 mg/250 mL (1,250 mg Intravenous New Bag/Given 01/28/23 1521)  ceFEPIme (MAXIPIME) 2 g in sodium chloride 0.9 % 100 mL IVPB (has no administration in time range)  sodium chloride 0.9 % bolus 1,000 mL (0 mLs Intravenous Stopped 01/28/23 1219)  LORazepam (ATIVAN) tablet 1 mg (1 mg Oral Given 01/28/23 0657)  diazepam (VALIUM) injection 5 mg (5 mg Intravenous Given 01/28/23 1029)  cloNIDine (CATAPRES) tablet 0.1 mg (0.1 mg Oral Given 01/28/23 1211)  ziprasidone (GEODON) injection 20 mg (20 mg Intramuscular Given 01/28/23 1211)  sterile water (preservative free) injection (10 mLs  Given 01/28/23 1212)  ceFEPIme (MAXIPIME) 2 g in sodium chloride 0.9 % 100 mL IVPB (2 g Intravenous New Bag/Given 01/28/23 1444)    ED Course/ Medical Decision Making/ A&P Clinical Course as of 01/28/23 1523  Sun Jan 28, 2023  1220 Patient lying in bed with pulse ox cord and sheet wrapped around his neck.  Precedex drip will be started.  Patient will be admitted for acute psychosis likely secondary to polysubstance withdrawal.  Will obtain UDS [AS]  [place] with Intensivist Dr. Wilnette Kales. He will admit for acute psychosis [AS]    Clinical Course User Index [AS] Cesia Orf, [place] Folk, PA-C                             Medical Decision Making Amount and/or Complexity of Data Reviewed Labs: ordered. Radiology: ordered.  Risk Prescription drug management. Decision regarding hospitalization.  This patient presents to the ED for concern of left hand pain, acute psychosis, this involves an extensive number of treatment options, and is a complaint that carries with it a high risk of complications and morbidity.  The differential diagnosis includes abscess, cellulitis  Co morbidities that complicate the patient evaluation  polysubstance abuse including IV drug use, sepsis due to cellulitis, asthma, schizophrenia  My initial workup includes sepsis labs  Additional history obtained  from: Nursing notes from this visit. Previous records within EMR system admission notes from 01/13/2023 to 01/16/2023  I ordered, reviewed and interpreted labs which include: CBC, CMP, lactate, urinalysis, UDS.  Hyponatremia of 132, hypokalemia of 3.4.   I ordered imaging studies including x-ray left hand I independently visualized  and interpreted imaging which showed subcutaneous gas between the first and second digit, however this may be the open wound that is being read as gas I agree with the radiologist interpretation  Cardiac Monitoring:  The patient was maintained on a cardiac monitor.  I personally viewed and interpreted the cardiac monitored which showed an underlying rhythm of: Sinus tachycardia  Consultations Obtained:  I requested consultation with the intensivist Dr. Lake Bells,  and discussed lab and imaging findings as well as pertinent plan - they recommend: Admission, continued IV antibiotics, likely need MRI in the future  Afebrile, persistent borderline tachycardia.  Otherwise hemodynamically stable.  37 year old male presenting to the ED for evaluation of left hand pain.  Believes he has an infection.  He was recently discharged after surgical debridement of an abscess to the same area.  He has since injected drugs to the same area.  He is SIRS negative, however have suspicion for significant cellulitis with progression into osteomyelitis versus necrotizing fasciitis due to risk factors and persistent use of injection drugs.  Plan was initially to obtain sepsis workup and MRI of left hand and possible admission for IV antibiotics.  He was hyperactive and became increasingly agitated.  He was treated with numerous doses of benzodiazepine, Geodon and clonidine with minimal improvement.  He was then found to have pulse ox cord wrapped around his neck.  He was started on Precedex drip for his safety.  Will admit for IV antibiotics and treatment of his acute psychosis.  He has significant  mental health difficulties and has been out of his medications for the past 3 days.  This is likely cause of his symptoms.  May also have a component of amphetamine and fentanyl withdrawal.  Stable at time of admission  Patient's case discussed with Dr. Wyvonnia Dusky who agrees with plan to admit.   Note: Portions of this report may have been transcribed using voice recognition software. Every effort was made to ensure accuracy; however, inadvertent computerized transcription errors may still be present.        Final Clinical Impression(s) / ED Diagnoses Final diagnoses:  Psychosis, unspecified psychosis type ([place])  Cellulitis of left upper extremity    Rx / DC Orders ED Discharge Orders     None         Roylene Reason, PA-C 01/28/23 [address] Grafton Folk, PA-C 01/28/23 1523    Ezequiel Essex, MD 01/28/23 1700

## 2023-01-28 NOTE — ED Triage Notes (Signed)
Pt presents with EMS from street.  Pt is twitching and detoxing reportedly from heroin.  Last use this morning.  States sometimes his heroin is laced with meth.  Pt has abscess on left hand as well that has had treatment recently but appears to be getting worse.

## 2023-01-28 NOTE — H&P (Signed)
NAME:  Jason Buchanan, MRN:  [id number], DOB:  09/14/1986, LOS: 0 ADMISSION DATE:  01/28/2023, CONSULTATION DATE:  2/25 REFERRING MD: Dr. Wyvonnia Dusky, CHIEF COMPLAINT: Hand pain  History of Present Illness:  37 year old male with a past medical history significant for narcotic abuse, schizophrenia, IV drug abuse and recent left hand abscess and tenosynovitis requiring debridement by Dr. Apolonio Schneiders on January 13, 2023 presented today to the [facility] emergency department complaining of pain in his left hand.  The patient had recently been discharged from this facility on February 13 after admission, surgical management of left hand abscess and tenosynovitis.  He was discharged on muscle relaxants, narcotics for pain medicine.  He returned to the emergency department today complaining of hand pain and stated that he had recently been injecting IV fentanyl and heroin.  He apparently uses methamphetamine from time to time as well.  Injected his left wrist recently.  Had been injecting his hands.  The patient indicated to the ER physician that he ran out of all of his home medications at least 3 days ago.  In the emergency room today he was noted to have bizarre, erratic behavior and could not be redirected.  The patient would get up and walk around and was actively hallucinating.  He was reportedly not directly aggressive with staff but he was agitated and could not be redirected to receive care.  He was noted to be walking around the hallways without close on.  He required security and multiple staff members to redirect him.  Ultimately did not respond to Geodon which was administered, required Precedex to remain calm.  On my arrival the patient was sedated with Precedex and unable to provide history, history was obtained from chart review and discussing care with the patient's physicians.  Pertinent  Medical History  Asthma: IV drug abuse: Fentanyl, heroin Methamphetamine use Schizophrenia Left hand  abscess/tenosynovitis: Streptococcus intermedius 01/13/2023 debrided by Dr. Apolonio Schneiders Hepatitis C Alcohol abuse, in remission per recent notes  [place] [facility] Events: Including procedures, antibiotic start and stop dates in addition to other pertinent events   January 28, 2023 admission in context of severe psychosis, delirium and left hand pain and cellulitis.  Interim History / Subjective:  As above  Objective   Blood pressure (!) 98/59, pulse 80, temperature 97.6 F (36.4 C), temperature source Axillary, resp. rate 15, height '5\' 6"'$  (1.676 m), weight 70.3 kg, SpO2 97 %.       No intake or output data in the 24 hours ending 01/28/23 1443 Filed Weights   01/28/23 0047  Weight: 70.3 kg    Examination:  General:  Resting comfortably in bed on precedex HENT: NCAT OP clear PULM: CTA B, normal effort CV: RRR, no mgr GI: BS+, soft, nontender MSK: normal bulk and tone Derm: multiple chronic appearing track marks and skin thickening in linear distribution over veins of bilateral arms, left hand is red, swollen, warm to touch, no area of fluctuance, cap refill intact Neuro: sedated, minimally responsive to touch, voice     Resolved Hospital Problem list     Assessment & Plan:  Acute psychosis, history of schizophrenia Noncompliance with antipsychotics Complicated by narcotic abuse, methamphetamine abuse Remote history of alcohol abuse Admit to ICU Precedex titrated to RASS goal of 0 to -1 Thiamine, folate Check ammonia As needed Haldol Wean off Precedex over next 24 hours Restart home dose of Seroquel  Left hand cellulitis, recent tenosynovitis and abscess: Consulted orthopedics Suspect he will need an MRI  of his hand and stable Vanco, cefepime per pharmacy Follow-up blood cultures  IV drug user at risk for bacteremia and endocarditis: Follow-up blood cultures  Chronic pain Acute pain from cellulitis Currently given mental status will continue Precedex and  hold off on narcotics As able to wean off Precedex suspect we will need to add back IV fentanyl For now follow as needed  Needs close monitoring in intensive care unit environment high risk of deterioration to respiratory failure or death.  Best Practice (right click and "Reselect all SmartList Selections" daily)   Diet/type: NPO DVT prophylaxis: LMWH GI prophylaxis: N/A Lines: N/A Foley:  N/A Code Status:  full code Last date of multidisciplinary goals of care discussion [I called his brother Iona Beard and gave him an update, he hasn't been in contact with his brother in years.]  Labs   CBC: Recent Labs  Lab 01/28/23 0910  WBC 10.1  NEUTROABS 5.0  HGB 12.9*  HCT 39.5  MCV 87.8  PLT 429*    Basic Metabolic Panel: Recent Labs  Lab 01/28/23 0910  NA 132*  K 3.4*  CL 100  CO2 22  GLUCOSE 84  BUN 11  CREATININE 0.56*  CALCIUM 8.9   GFR: Estimated Creatinine Clearance: 115.2 mL/min (A) (by C-G formula based on SCr of 0.56 mg/dL (L)). Recent Labs  Lab 01/28/23 0910  WBC 10.1  LATICACIDVEN 1.4    Liver Function Tests: Recent Labs  Lab 01/28/23 0910  AST 41  ALT 38  ALKPHOS 110  BILITOT 1.3*  PROT 7.0  ALBUMIN 4.0   No results for input(s): "LIPASE", "AMYLASE" in the last 168 hours. No results for input(s): "AMMONIA" in the last 168 hours.  ABG    Component Value Date/Time   HCO3 21.5 11/17/2021 1437   TCO2 24 11/06/2017 1518   ACIDBASEDEF 1.8 11/17/2021 1437   O2SAT 91.4 11/17/2021 1437     Coagulation Profile: No results for input(s): "INR", "PROTIME" in the last 168 hours.  Cardiac Enzymes: No results for input(s): "CKTOTAL", "CKMB", "CKMBINDEX", "TROPONINI" in the last 168 hours.  HbA1C: No results found for: "HGBA1C"  CBG: No results for input(s): "GLUCAP" in the last 168 hours.  Review of Systems:   Cannot obtain due to heavy sedation  Past Medical History:  He,  has a past medical history of Asthma, Mood disorder ([place]),  Polysubstance abuse ([place]), and Schizophrenia ([place]).   Surgical History:   Past Surgical History:  Procedure Laterality Date   ANKLE SURGERY     APPLICATION OF WOUND VAC Right 11/22/2021   Procedure: APPLICATION OF WOUND VAC;  Surgeon: Dayna Barker, MD;  Location: [facility];  Service: Plastics;  Laterality: Right;   APPLICATION OF WOUND VAC Right 11/25/2021   Procedure: APPLICATION OF WOUND VAC;  Surgeon: Dayna Barker, MD;  Location: [facility];  Service: Plastics;  Laterality: Right;   APPLICATION OF WOUND VAC Right 12/07/2021   Procedure: APPLICATION OF WOUND VAC;  Surgeon: Dayna Barker, MD;  Location: [facility];  Service: Plastics;  Laterality: Right;   APPLICATION OF WOUND VAC Right 12/14/2021   Procedure: APPLICATION OF WOUND VAC;  Surgeon: [name], MD;  Location: [facility];  Service: Plastics;  Laterality: Right;   APPLICATION OF WOUND VAC Right 12/22/2021   Procedure: WOUND VAC CHANGE;  Surgeon: [name], MD;  [facility]: [facility];  Service: Plastics;  Laterality: Right;   APPLICATION OF WOUND VAC Right 12/29/2021   Procedure: APPLICATION OF WOUND VAC;  Surgeon: [name], MD;  [facility]: [facility];  Service: Plastics;  Laterality: Right;   APPLICATION OF WOUND VAC Right 01/03/2022   Procedure: WOUND VAC APPLICATION;  Surgeon: [name], MD;  [facility]: [facility];  Service: Plastics;  Laterality: Right;   BIOPSY  11/17/2021   Procedure: BIOPSY;  Surgeon: Clarene Essex, MD;  [facility];  Service: Endoscopy;;   BUBBLE STUDY  09/30/2021   Procedure: BUBBLE STUDY;  Surgeon: Dixie Dials, MD;  Location: [place];  Service: Cardiovascular;;   DEBRIDEMENT AND CLOSURE WOUND Right 01/03/2022   Procedure: DEBRIDEMENT AND COMPLEX CLOSURE OF RIGHT ARM WOUND;  Surgeon: [name], MD;  [facility]: [facility];  Service: Plastics;  Laterality: Right;   ESOPHAGOGASTRODUODENOSCOPY (EGD) WITH PROPOFOL N/A 11/17/2021   Procedure: ESOPHAGOGASTRODUODENOSCOPY (EGD) WITH PROPOFOL;  Surgeon:  Clarene Essex, MD;  [facility];  Service: Endoscopy;  Laterality: N/A;   FRACTURE SURGERY     I & D EXTREMITY Right 09/27/2021   Procedure: IRRIGATION AND DEBRIDEMENT OF ABSCESS RIGHT HAND;  Surgeon: Dayna Barker, MD;  Location: [place];  Service: Plastics;  Laterality: Right;   I & D EXTREMITY Bilateral 11/15/2021   Procedure: IRRIGATION AND DEBRIDEMENT EXTREMITY;  Surgeon: Dayna Barker, MD;  [facility];  Service: Plastics;  Laterality: Bilateral;   I & D EXTREMITY Right 11/17/2021   Procedure: IRRIGATION AND DEBRIDEMENT EXTREMITY;  Surgeon: Dayna Barker, MD;  [facility];  Service: Plastics;  Laterality: Right;   I & D EXTREMITY Bilateral 12/01/2021   Procedure: INCISION AND DRAINAGE BILATERAL UPPER EXTREMITIES WITH  WOUND VAC CHANGE RIGHT;  Surgeon: Orene Desanctis, MD;  Location: [facility];  Service: Orthopedics;  Laterality: Bilateral;   I & D EXTREMITY Right 12/07/2021   Procedure: IRRIGATION AND DEBRIDEMENT EXTREMITY, MANIPULATION OF FINGERS UNDER ANESTHESIA;  Surgeon: Dayna Barker, MD;  [facility]: [facility];  Service: Plastics;  Laterality: Right;   I & D EXTREMITY Left 01/13/2023   Procedure: IRRIGATION AND DEBRIDEMENT EXTREMITY;  Surgeon: Iran Planas, MD;  [facility];  Service: Orthopedics;  Laterality: Left;   INCISION AND DRAINAGE OF WOUND Left 09/27/2021   Procedure: IRRIGATION AND DEBRIDEMENT OF ABSCESS LEFT INDEX FINGER;  Surgeon: Dayna Barker, MD;  Location: [place];  Service: Plastics;  Laterality: Left;   INCISION AND DRAINAGE OF WOUND Bilateral 11/22/2021   Procedure: IRRIGATION AND DEBRIDEMENT WOUND;  Surgeon: Dayna Barker, MD;  [facility]: [facility];  Service: Plastics;  Laterality: Bilateral;   INCISION AND DRAINAGE OF WOUND Bilateral 11/25/2021   Procedure: BILATERAL IRRIGATION AND DEBRIDEMENT BILATERAL ARMS;  Surgeon: Dayna Barker, MD;  [facility]: [facility];  Service: Plastics;  Laterality: Bilateral;   INCISION AND DRAINAGE OF WOUND Right 12/14/2021    Procedure: IRRIGATION AND DEBRIDEMENT RIGHT ARM;  Surgeon: [name], MD;  [facility];  Service: Plastics;  Laterality: Right;   INCISION AND DRAINAGE OF WOUND Right 12/22/2021   Procedure: IRRIGATION AND DEBRIDEMENT ARM  WOUND;  Surgeon: [name], MD;  [facility]: [facility];  Service: Plastics;  Laterality: Right;  1 hour   INCISION AND DRAINAGE OF WOUND Right 12/29/2021   Procedure: DEBRIDEMENT RIGHT ARM  WOUND;  Surgeon: [name], MD;  Location: [facility];  Service: Plastics;  Laterality: Right;   JOINT REPLACEMENT     SCLEROTHERAPY  11/17/2021   Procedure: Clide Deutscher;  Surgeon: Clarene Essex, MD;  [facility];  Service: Endoscopy;;   SKIN SPLIT GRAFT Right 01/03/2022   Procedure: SKIN GRAFT SPLIT THICKNESS;  Surgeon: [name], MD;  Location[facility];  Service: Clinical cytogeneticist;  Laterality: Right;   TEE WITHOUT CARDIOVERSION N/A 09/30/2021   Procedure: TRANSESOPHAGEAL ECHOCARDIOGRAM (TEE);  Surgeon: Dixie Dials, MD;  Location[facility];  Service: Cardiovascular;  Laterality: N/A;     Social History:   reports that he has been smoking cigarettes. He has been smoking an average of .5 packs per day. He has never used smokeless tobacco. He reports that he does not currently use alcohol. He reports current drug use. Drugs: IV and Marijuana.   Family History:  His family history is not on file.   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 Had prolonged block (>30 min) after succinylcholine for GI procedure.    Chlorhexidine Other (See Comments)    Pt does not remember reaction    Naproxen Other (See Comments)    Stomach upset     Home Medications  Prior to Admission medications   Medication Sig Start Date End Date Taking?  Authorizing Provider  gabapentin (NEURONTIN) 400 MG capsule Take 1 capsule (400 mg total) by mouth 3 (three) times daily. 01/17/22 01/14/23  Donne Hazel, MD  methocarbamol (ROBAXIN) 500 MG tablet Take 500 mg by mouth every 8 (eight) hours as needed for muscle spasms.    [provider]  naloxone [facility]) nasal spray 4 mg/0.1 mL Take as needed for Narcotic overdose. 12/12/22   Tretha Sciara, MD  naloxone [facility]) nasal spray 4 mg/0.1 mL Take in case of overdose, call 911 01/10/23   Wynona Dove A, DO  pantoprazole (PROTONIX) 40 MG tablet Take 1 tablet (40 mg total) by mouth daily. 01/18/22 01/14/23  Donne Hazel, MD  polyethylene glycol powder [facility]) 17 GM/SCOOP powder Take 17 g by mouth daily as needed (constipation). 01/16/23   Shalhoub, Sherryll Burger, MD  QUEtiapine (SEROQUEL XR) 300 MG 24 hr tablet Take 1 tablet (300 mg total) by mouth at bedtime. 01/17/22 01/14/23  Donne Hazel, MD  senna (SENOKOT) 8.6 MG TABS tablet Take 2 tablets (17.2 mg total) by mouth at bedtime. 01/16/23   Shalhoub, Sherryll Burger, MD     Critical care time: 40 minutes     Roselie Awkward, MD [place] [facility] Pager: [phone] Cell: [phone] After 7:00 pm call Elink  [phone]

## 2023-01-28 NOTE — Progress Notes (Signed)
A consult was received from an ED physician for vancomycin and cefepime per pharmacy dosing.  The patient's profile has been reviewed for ht/wt/allergies/indication/available labs.   A one time order has been placed for vancomycin '1250mg'$  and cefepime 2g.  Further antibiotics/pharmacy consults should be ordered by admitting physician if indicated.                       Thank you, Peggyann Juba, PharmD, BCPS 01/28/2023  1:58 PM

## 2023-01-28 NOTE — Progress Notes (Addendum)
[facility] Progress Note Patient Name: Jason Buchanan DOB: [birth year] MRN: [id number]   Date of Service  01/28/2023  HPI/Events of Note  K+ 3.4 from 0910, didn't see replacement    PIV's, don't see a diet order but looks like he has meds po scheduled  Camera evaluation done. VS stable. Still confused. But follows commands. He can swallow, taking meds. MAP 60, HR 72.  On room air.   eICU Interventions  Diet ordered. Clear liquid. Get K/mag level, if low replace LR bolus Asp precautions     Intervention Category Intermediate Interventions: Electrolyte abnormality - evaluation and management  [name] 01/28/2023, 9:28 PM  23:17 K/Mag at 3.2/1.8  Replacement  protocol ordered. Cr normal.  0620 c/o severe pain  10/10 in hand from abcess, asking for something other than tylenol, can take O's and has IV. Cr normal. Hg > 10. - ketorolac once ordereed( allergy-stomach upset for naproxen) -

## 2023-01-28 NOTE — Progress Notes (Signed)
Pharmacy Antibiotic Note  Jason Buchanan is a 37 y.o. male admitted on 01/28/2023 with an abscess to left hand. He was recently admitted for drainage of abscess and given IV antibiotics then discharged on Bactrim and Augmentin. Patient reports daily fentanyl and heroin use; injects into bilateral hands. Pharmacy has been consulted for cefepime dosing.  Plan: -Cefepime 2 g IV q8h Pharmacy to follow renal function, cultures and clinical progress for dose adjustments and de-escalation as indicated  Height: '5\' 6"'$  (167.6 cm) Weight: 70.3 kg (155 lb) IBW/kg (Calculated) : 63.8  Temp (24hrs), Avg:97.9 F (36.6 C), Min:97.6 F (36.4 C), Max:98.3 F (36.8 C)  Recent Labs  Lab 01/28/23 0910  WBC 10.1  CREATININE 0.56*  LATICACIDVEN 1.4    Estimated Creatinine Clearance: 115.2 mL/min (A) (by C-G formula based on SCr of 0.56 mg/dL (L)).    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 Had prolonged block (>30 min) after succinylcholine for GI procedure.    Chlorhexidine Other (See Comments)    Pt does not remember reaction    Naproxen Other (See Comments)    Stomach upset    Antimicrobials this admission: Cefepime 2/25 >> Vancomycin 2/25 x 1  Dose adjustments this admission: NA  Microbiology results: 2/25 BCx: pending   Thank you for allowing pharmacy to be a part of this patient's care.  Tawnya Crook, PharmD, BCPS Clinical Pharmacist 01/28/2023 3:24 PM

## 2023-01-29 DIAGNOSIS — L03114 Cellulitis of left upper limb: Secondary | ICD-10-CM

## 2023-01-29 LAB — BASIC METABOLIC PANEL
Anion gap: 7 (ref 5–15)
BUN: 8 mg/dL (ref 6–20)
CO2: 20 mmol/L — ABNORMAL LOW (ref 22–32)
Calcium: 8.1 mg/dL — ABNORMAL LOW (ref 8.9–10.3)
Chloride: 108 mmol/L (ref 98–111)
Creatinine, Ser: 0.54 mg/dL — ABNORMAL LOW (ref 0.61–1.24)
GFR, Estimated: >60 mL/min (ref >60)
Glucose, Bld: 147 mg/dL — ABNORMAL HIGH (ref 70–99)
Potassium: 3.3 mmol/L — ABNORMAL LOW (ref 3.5–5.1)
Sodium: 135 mmol/L (ref 135–145)

## 2023-01-29 LAB — CBC
HCT: 33.2 % — ABNORMAL LOW (ref 39.0–52.0)
Hemoglobin: 10.9 g/dL — ABNORMAL LOW (ref 13.0–17.0)
MCH: 30.1 pg (ref 26.0–34.0)
MCHC: 32.8 g/dL (ref 30.0–36.0)
MCV: 91.7 fL (ref 80.0–100.0)
Platelets: 290 10*3/uL (ref 150–400)
RBC: 3.62 MIL/uL — ABNORMAL LOW (ref 4.22–5.81)
RDW: 12.3 % (ref 11.5–15.5)
WBC: 5.8 10*3/uL (ref 4.0–10.5)
nRBC: 0 % (ref 0.0–0.2)

## 2023-01-29 LAB — PHOSPHORUS: Phosphorus: 2.9 mg/dL (ref 2.5–4.6)

## 2023-01-29 LAB — MAGNESIUM: Magnesium: 2.1 mg/dL (ref 1.7–2.4)

## 2023-01-29 MED ORDER — VANCOMYCIN HCL 1250 MG/250ML IV SOLN
1250.0000 mg | Freq: Two times a day (BID) | INTRAVENOUS | Status: DC
  Administered 2023-01-29 – 2023-01-31 (×4): 1250 mg via INTRAVENOUS
  Filled 2023-01-29 (×4): qty 250

## 2023-01-29 MED ORDER — VANCOMYCIN HCL 1500 MG/300ML IV SOLN
1500.0000 mg | Freq: Once | INTRAVENOUS | Status: AC
  Administered 2023-01-29: 1500 mg via INTRAVENOUS
  Filled 2023-01-29: qty 300

## 2023-01-29 MED ORDER — BUPRENORPHINE HCL-NALOXONE HCL 8-2 MG SL SUBL: 1.0000 | SUBLINGUAL_TABLET | Freq: Two times a day (BID) | SUBLINGUAL | Status: DC

## 2023-01-29 MED ORDER — HALOPERIDOL LACTATE 5 MG/ML IJ SOLN: 2.0000 mg | Freq: Every day | INTRAMUSCULAR | Status: DC

## 2023-01-29 MED ORDER — FOLIC ACID 1 MG PO TABS
1.0000 mg | ORAL_TABLET | Freq: Every day | ORAL | Status: DC
  Administered 2023-01-30 – 2023-02-05 (×7): 1 mg via ORAL
  Filled 2023-01-29 (×7): qty 1

## 2023-01-29 MED ORDER — THIAMINE MONONITRATE 100 MG PO TABS
100.0000 mg | ORAL_TABLET | Freq: Every day | ORAL | Status: DC
  Administered 2023-01-30 – 2023-02-05 (×7): 100 mg via ORAL
  Filled 2023-01-29 (×7): qty 1

## 2023-01-29 MED ORDER — KETOROLAC TROMETHAMINE 10 MG PO TABS
10.0000 mg | ORAL_TABLET | Freq: Once | ORAL | Status: AC
  Administered 2023-01-29: 10 mg via ORAL
  Filled 2023-01-29: qty 1

## 2023-01-29 MED ORDER — BUPRENORPHINE HCL-NALOXONE HCL 8-2 MG SL SUBL
1.0000 | SUBLINGUAL_TABLET | Freq: Two times a day (BID) | SUBLINGUAL | Status: DC
  Administered 2023-01-29 – 2023-02-05 (×15): 1 via SUBLINGUAL
  Filled 2023-01-29 (×15): qty 1

## 2023-01-29 MED ORDER — BUPRENORPHINE HCL-NALOXONE HCL 2-0.5 MG SL SUBL
2.0000 | SUBLINGUAL_TABLET | SUBLINGUAL | Status: AC | PRN
  Administered 2023-01-30: 2 via SUBLINGUAL
  Filled 2023-01-29 (×2): qty 2

## 2023-01-29 NOTE — Progress Notes (Signed)
NAME:  Jason Buchanan, MRN:  [id number], DOB:  11/07/1986, LOS: 0 ADMISSION DATE:  01/28/2023, CONSULTATION DATE:  2/25 REFERRING MD: Dr. Wyvonnia Dusky, CHIEF COMPLAINT: Hand pain  History of Present Illness:  37 year old male with a past medical history significant for narcotic abuse, schizophrenia, IV drug abuse and recent left hand abscess and tenosynovitis requiring debridement by Dr. Apolonio Schneiders on January 13, 2023 presented today to the [facility] emergency department complaining of pain in his left hand.  The patient had recently been discharged from this facility on February 13 after admission, surgical management of left hand abscess and tenosynovitis.  He was discharged on muscle relaxants, narcotics for pain medicine.  He returned to the emergency department today complaining of hand pain and stated that he had recently been injecting IV fentanyl and heroin.  He apparently uses methamphetamine from time to time as well.  Injected his left wrist recently.  Had been injecting his hands.  The patient indicated to the ER physician that he ran out of all of his home medications at least 3 days ago.  In the emergency room today he was noted to have bizarre, erratic behavior and could not be redirected.  The patient would get up and walk around and was actively hallucinating.  He was reportedly not directly aggressive with staff but he was agitated and could not be redirected to receive care.  He was noted to be walking around the hallways without close on.  He required security and multiple staff members to redirect him.  Ultimately did not respond to Geodon which was administered, required Precedex to remain calm.  On my arrival the patient was sedated with Precedex and unable to provide history, history was obtained from chart review and discussing care with the patient's physicians.  Pertinent  Medical History  Asthma: IV drug abuse: Fentanyl, heroin Methamphetamine use Schizophrenia Left hand  abscess/tenosynovitis: Streptococcus intermedius 01/13/2023 debrided by Dr. Apolonio Schneiders Hepatitis C Alcohol abuse, in remission per recent notes  [place] [facility] Events: Including procedures, antibiotic start and stop dates in addition to other pertinent events   January 28, 2023 admission in context of severe psychosis, delirium and left hand pain and cellulitis. Left had: 1. Gas in the soft tissues between the left first and second metacarpals, which could be postoperative if there has been recent incision/drainage, or could be indicative of infection with gas-forming organisms. 2/26 oriented x2, marked pain still.   Interim History / Subjective:  Sedated   Objective   Blood pressure (!) 98/59, pulse 80, temperature 97.6 F (36.4 C), temperature source Axillary, resp. rate 15, height '5\' 6"'$  (1.676 m), weight 70.3 kg, SpO2 97 %.       No intake or output data in the 24 hours ending 01/28/23 1443 Filed Weights   01/28/23 0047  Weight: 70.3 kg    Examination: General chronically ill appearing 37 year old male sedated on precedex HENT NCAT no JVD  Pulm some wheezing. No accessory use  Card rrr Abd soft Ext warm left hand remains swollen, painful to touch. He is able to move it. Pulses are palp. On exam really appears unchanged from initial picture referenced below Right hand swollen but not painful  Neuro awakens to request. Oriented x 2, no focal weakness but pain limits movement of left hand      Resolved Hospital Problem list     Assessment & Plan:  Acute psychosis, history of schizophrenia Noncompliance with antipsychotics Complicated by narcotic abuse, methamphetamine abuse Remote  history of alcohol abuse Plan Wean precedex to off over course of today  Cont Thiamine, folate As needed Haldol Restarted home dose of Seroquel  Left hand cellulitis, recent tenosynovitis and abscess: Consulted orthopedics (awaiting formal consult still) Plan Awaiting MRI of  hand Elevate L hand Day 2 vanc and cefepime  Will reach out to ortho again after MRI if they have not seen by time it is complete   Fluid and Electrolyte imbalance: hypokalemia  Plan Replace and recheck   IV drug user at risk for bacteremia and endocarditis: Plan Follow-up blood cultures  Chronic pain Acute pain from cellulitis Plan Careful w/ narcs    Best Practice (right click and "Reselect all SmartList Selections" daily)   Diet/type: NPO DVT prophylaxis: LMWH GI prophylaxis: N/A Lines: N/A Foley:  N/A Code Status:  full code Last date of multidisciplinary goals of care discussion on admission     Critical care time: 32 min    Erick Colace ACNP-BC [place] Pager # [phone] OR # [phone] if no answer

## 2023-01-29 NOTE — Progress Notes (Signed)
Pharmacy Antibiotic Note  Jason Buchanan is a 37 y.o. male admitted on 01/28/2023 with an abscess to left hand. He was recently admitted for drainage of abscess and given IV antibiotics then discharged on Bactrim and Augmentin. Patient reports daily fentanyl and heroin use; injects into bilateral hands. Pharmacy has been consulted for cefepime and vancomycin dosing.  Plan: Cefepime 2 g IV q8h Vancomycin 1500 mg loading dose followed by 1250 mg IV q12h for estimated AUC of 506. Goal vancomycin AUC 400-550.   Monitor culture data, renal function. Check vancomycin levels at steady state as needed.   Height: '5\' 6"'$  (167.6 cm) Weight: 68.5 kg (151 lb 0.2 oz) IBW/kg (Calculated) : 63.8  Temp (24hrs), Avg:97.3 F (36.3 C), Min:96.3 F (35.7 C), Max:97.8 F (36.6 C)  Recent Labs  Lab 01/28/23 0910 01/29/23 0316  WBC 10.1 5.8  CREATININE 0.56* 0.54*  LATICACIDVEN 1.4  --      Estimated Creatinine Clearance: 115.2 mL/min (A) (by C-G formula based on SCr of 0.54 mg/dL (L)).    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 Had prolonged block (>30 min) after succinylcholine for GI procedure.    Chlorhexidine Other (See Comments)    Pt does not remember reaction    Naproxen Other (See Comments)    Stomach upset    Antimicrobials this admission: Cefepime 2/25 >> Vancomycin 2/25 x 1, then 2/26 >>  Dose adjustments this admission: NA  Microbiology results: 2/25 BCx: pending  Lenis Noon, PharmD, BCPS Clinical Pharmacist 01/29/2023 10:01 AM

## 2023-01-29 NOTE — Progress Notes (Signed)
PT SEEN/EXAMINED PT'S HAND LOOKS MUCH BETTER NO EVIDENCE OF REFORMATION OF ABSCESS NO ASCENDING ERYTHEMA WOUND IS HEALING AS EXPECTED OK TO CONTINUE IV ABX WHILE IN HOSPITAL AND CONVERT BACK TO ORAL NO SURGERY NEEDED AT CURRENT TIME FOR HAND OK FOR REGULAR DIET DISCUSSED WITH NURSING STAFF CAN F/U WITH ME AS AN OUTPATIENT PLEASE CONTACT ME IF ANY FURTHER QUESTIONS

## 2023-01-30 DIAGNOSIS — F29 Unspecified psychosis not due to a substance or known physiological condition: Secondary | ICD-10-CM

## 2023-01-30 DIAGNOSIS — E876 Hypokalemia: Secondary | ICD-10-CM

## 2023-01-30 LAB — BASIC METABOLIC PANEL
Anion gap: 8 (ref 5–15)
BUN: 7 mg/dL (ref 6–20)
CO2: 20 mmol/L — ABNORMAL LOW (ref 22–32)
Calcium: 8 mg/dL — ABNORMAL LOW (ref 8.9–10.3)
Chloride: 110 mmol/L (ref 98–111)
Creatinine, Ser: 0.6 mg/dL — ABNORMAL LOW (ref 0.61–1.24)
GFR, Estimated: >60 mL/min (ref >60)
Glucose, Bld: 119 mg/dL — ABNORMAL HIGH (ref 70–99)
Potassium: 4.5 mmol/L (ref 3.5–5.1)
Sodium: 138 mmol/L (ref 135–145)

## 2023-01-30 LAB — CBC
HCT: 33.5 % — ABNORMAL LOW (ref 39.0–52.0)
Hemoglobin: 11 g/dL — ABNORMAL LOW (ref 13.0–17.0)
MCH: 29.4 pg (ref 26.0–34.0)
MCHC: 32.8 g/dL (ref 30.0–36.0)
MCV: 89.6 fL (ref 80.0–100.0)
Platelets: 254 10*3/uL (ref 150–400)
RBC: 3.74 MIL/uL — ABNORMAL LOW (ref 4.22–5.81)
RDW: 12.4 % (ref 11.5–15.5)
WBC: 4.1 10*3/uL (ref 4.0–10.5)
nRBC: 0 % (ref 0.0–0.2)

## 2023-01-30 LAB — GLUCOSE, CAPILLARY: Glucose-Capillary: 116 mg/dL — ABNORMAL HIGH (ref 70–99)

## 2023-01-30 LAB — MAGNESIUM: Magnesium: 1.5 mg/dL — ABNORMAL LOW (ref 1.7–2.4)

## 2023-01-30 MED ORDER — MELATONIN 3 MG PO TABS
3.0000 mg | ORAL_TABLET | Freq: Every evening | ORAL | Status: DC | PRN
  Administered 2023-01-30 – 2023-02-04 (×7): 3 mg via ORAL
  Filled 2023-01-30 (×7): qty 1

## 2023-01-30 MED ORDER — MAGNESIUM SULFATE 4 GM/100ML IV SOLN
4.0000 g | Freq: Once | INTRAVENOUS | Status: DC
  Filled 2023-01-30: qty 100

## 2023-01-30 MED ORDER — BUPRENORPHINE HCL-NALOXONE HCL 2-0.5 MG SL SUBL
2.0000 | SUBLINGUAL_TABLET | SUBLINGUAL | Status: AC | PRN
  Administered 2023-01-30 – 2023-01-31 (×2): 2 via SUBLINGUAL
  Filled 2023-01-30 (×2): qty 2

## 2023-01-30 MED ORDER — KETOROLAC TROMETHAMINE 10 MG PO TABS
10.0000 mg | ORAL_TABLET | Freq: Once | ORAL | Status: AC
  Administered 2023-01-30: 10 mg via ORAL
  Filled 2023-01-30: qty 1

## 2023-01-30 MED ORDER — GABAPENTIN 300 MG PO CAPS
300.0000 mg | ORAL_CAPSULE | Freq: Three times a day (TID) | ORAL | Status: DC
  Administered 2023-01-30 – 2023-02-05 (×20): 300 mg via ORAL
  Filled 2023-01-30 (×20): qty 1

## 2023-01-30 MED ORDER — MAGNESIUM SULFATE 4 GM/100ML IV SOLN
4.0000 g | Freq: Once | INTRAVENOUS | Status: AC
  Administered 2023-01-30: 4 g via INTRAVENOUS
  Filled 2023-01-30: qty 100

## 2023-01-30 NOTE — Progress Notes (Signed)
[facility] Progress Note Patient Name: Jason Buchanan DOB: 1986-03-10 MRN: [id number]   Date of Service  01/30/2023  HPI/Events of Note  Patient c/o L hand pain and generalized body aches. Received Toradol PO yesterday.   eICU Interventions  Plan: Toradol 10 mg PO X 1.      Intervention Category Major Interventions: Other:  Edilberto Roosevelt Cornelia Copa 01/30/2023, 3:52 AM

## 2023-01-30 NOTE — Progress Notes (Signed)
[facility]                                                                               Jason Buchanan, is a 37 y.o. male, DOB - 1986-08-29, [id number] Admit date - 01/28/2023    Outpatient Primary MD for the patient is [name], [name], NP  LOS - 2  days    Brief summary   37 year old male with a past medical history significant for narcotic abuse, schizophrenia, IV drug abuse and recent left hand abscess and tenosynovitis requiring debridement by Dr. Apolonio Schneiders on January 13, 2023 presented  to the [facility] emergency department complaining of pain in his left hand. The patient had recently been discharged from this facility on February 13 after admission, surgical management of left hand abscess and tenosynovitis. He was discharged on muscle relaxants, narcotics for pain medicine. He returned to the emergency department complaining of hand pain and stated that he had recently been injecting IV fentanyl and heroin.   He was [facility] by [facility] and was on precedex.    Assessment & Plan     Left hand cellulitis, recent tenosynovitis and abscess:  Dr Caralyn Guile consulted and recommendations given.  Continue with IV antibiotics for now.  Transition to oral antibiotics on discharge.  Follow up the blood cultures.  Wbc count wnl.    Mild normocytic anemia:  Hemoglobin stable 11.   Hypokalemia  Replaced   Hypomagnesemia:  Replaced.    H/o acute psychosis in the setting schizophrenia, Non compliance to meds.  H/o narcotic and methamphetamine abuse:   Weaned off precedex.  Continue with Seroquel and haldol.    H/o polysubstance abuse;  UDS is positive for tetrahydrocannabinol and amphetamines Would benefit from referral to pain clinic.     Chronic pain  He was started on suboxone by PCCM this admission.    Mild hyponatremia:  Resolved.    Estimated body mass index is 25.26 kg/m as calculated from the following:   Height as of this encounter: 5'  6" (1.676 m).   Weight as of this encounter: 71 kg.  Code Status: full code.  DVT Prophylaxis:  enoxaparin (LOVENOX) injection 40 mg Start: 01/28/23 2000   Level of Care: Level of care: ICU Family Communication: none at bedside.   Disposition Plan:     Remains inpatient appropriate:  pending improvement .    Procedures:  None.   Consultants:   PCCM Dr Caralyn Guile with Orthopedics.   Antimicrobials:   Anti-infectives (From admission, onward)    Start     Dose/Rate Route Frequency Ordered Stop   01/29/23 2200  vancomycin (VANCOREADY) IVPB 1250 mg/250 mL        1,250 mg 166.7 mL/hr over 90 Minutes Intravenous Every 12 hours 01/29/23 1000     01/29/23 1045  vancomycin (VANCOREADY) IVPB 1500 mg/300 mL        1,500 mg 150 mL/hr over 120 Minutes Intravenous  Once 01/29/23 1000 01/29/23 1431   01/28/23 2200  ceFEPIme (MAXIPIME) 2 g in sodium chloride 0.9 % 100 mL IVPB        2 g 200 mL/hr over 30 Minutes Intravenous Every 8 hours 01/28/23  1519     01/28/23 1400  vancomycin (VANCOREADY) IVPB 1250 mg/250 mL        1,250 mg 166.7 mL/hr over 90 Minutes Intravenous  Once 01/28/23 1358 01/28/23 1651   01/28/23 1400  ceFEPIme (MAXIPIME) 2 g in sodium chloride 0.9 % 100 mL IVPB        2 g 200 mL/hr over 30 Minutes Intravenous  Once 01/28/23 1358 01/28/23 1516        Medications  Scheduled Meds:  buprenorphine-naloxone  1 tablet Sublingual BID   enoxaparin (LOVENOX) injection  40 mg Subcutaneous [id number]   folic acid  1 mg Oral Daily   mupirocin ointment  1 Application Nasal BID   pantoprazole  40 mg Oral Daily   QUEtiapine  300 mg Oral QHS   senna  2 tablet Oral QHS   thiamine  100 mg Oral Daily   Continuous Infusions:  ceFEPime (MAXIPIME) IV Stopped (01/30/23 0713)   lactated ringers 100 mL/hr at 01/30/23 0817   vancomycin 1,250 mg (01/30/23 1001)   PRN Meds:.acetaminophen, buprenorphine-naloxone, docusate sodium, haloperidol lactate, melatonin, ondansetron (ZOFRAN) IV,  polyethylene glycol    Subjective:   Jason Buchanan was seen and examined today.   Requesting sleep medications.   Objective:   Vitals:   01/30/23 0600 01/30/23 0700 01/30/23 0800 01/30/23 0804  BP: (!) 153/88 (!) 152/72 (!) 141/85   Pulse: (!) 104 99 88   Resp: '20 18 18   '$ Temp:   98.1 F (36.7 C)   TempSrc:   Oral   SpO2: 95% 97% 99%   Weight:    71 kg  Height:        Intake/Output Summary (Last 24 hours) at 01/30/2023 1201 Last data filed at 01/30/2023 1007 Gross per 24 hour  Intake 2867.21 ml  Output 4800 ml  Net -1932.79 ml   Filed Weights   01/28/23 0047 01/28/23 1630 01/30/23 0804  Weight: 70.3 kg 68.5 kg 71 kg     Exam General exam: Appears calm and comfortable  Respiratory system: Clear to auscultation. Respiratory effort normal. Cardiovascular system: S1 & S2 heard, RRR. No JVD, murmurs,  Gastrointestinal system: Abdomen is nondistended, soft and nontender.  Central nervous system: Alert and oriented. No focal neurological deficits. Extremities: left hand bandaged.  Skin: No rashes, Psychiatry: Mood & affect appropriate.    Data Reviewed:  I have personally reviewed following labs and imaging studies   CBC Lab Results  Component Value Date   WBC 4.1 01/30/2023   RBC 3.74 (L) 01/30/2023   HGB 11.0 (L) 01/30/2023   HCT 33.5 (L) 01/30/2023   MCV 89.6 01/30/2023   MCH 29.4 01/30/2023   PLT 254 01/30/2023   MCHC 32.8 01/30/2023   RDW 12.4 01/30/2023   LYMPHSABS 3.1 01/28/2023   MONOABS 1.3 (H) 01/28/2023   EOSABS 0.6 (H) 01/28/2023   BASOSABS 0.1 A999333     Last metabolic panel Lab Results  Component Value Date   NA 138 01/30/2023   K 4.5 01/30/2023   CL 110 01/30/2023   CO2 20 (L) 01/30/2023   BUN 7 01/30/2023   CREATININE 0.60 (L) 01/30/2023   GLUCOSE 119 (H) 01/30/2023   GFRNONAA >60 01/30/2023   GFRAA >60 11/09/2017   CALCIUM 8.0 (L) 01/30/2023   PHOS 2.9 01/29/2023   PROT 7.0 01/28/2023   ALBUMIN 4.0 01/28/2023   BILITOT  1.3 (H) 01/28/2023   ALKPHOS 110 01/28/2023   AST 41 01/28/2023   ALT 38 01/28/2023  ANIONGAP 8 01/30/2023    CBG (last 3)  No results for input(s): "GLUCAP" in the last 72 hours.    Coagulation Profile: No results for input(s): "INR", "PROTIME" in the last 168 hours.   Radiology Studies: No results found.     Hosie Poisson M.D. [facility] 01/30/2023, 12:01 PM  Available via Epic secure chat 7am-7pm After 7 pm, please refer to night coverage provider listed on amion.

## 2023-01-30 NOTE — Progress Notes (Signed)
[facility] Progress Note Patient Name: Jason Buchanan DOB: May 21, 1986 MRN: [id number]   Date of Service  01/30/2023  HPI/Events of Note  Patient requests a sleep aid.   eICU Interventions  Plan: Melatonin 3 mg PO Q HS PRN sleep.      Intervention Category Major Interventions: Other:  Lysle Dingwall 01/30/2023, 12:37 AM

## 2023-01-30 NOTE — Progress Notes (Signed)
[facility] ADULT ICU REPLACEMENT PROTOCOL   The patient does apply for the [facility] Adult ICU Electrolyte Replacment Protocol based on the criteria listed below:   1.Exclusion criteria: TCTS, ECMO, Dialysis, and Myasthenia Gravis patients 2. Is GFR >/= 30 ml/min? Yes.    Patient's GFR today is >60 3. Is SCr </= 2? Yes.   Patient's SCr is 0.60 mg/dL 4. Did SCr increase >/= 0.5 in 24 hours? No. 5.Pt's weight >40kg  Yes.   6. Abnormal electrolyte(s): Mag 1.5  7. Electrolytes replaced per protocol 8.  Call MD STAT for K+ </= 2.5, Phos </= 1, or Mag </= 1 Physician:  Dr. Radene Knee, [place] Grumbling 01/30/2023 4:13 AM

## 2023-01-30 NOTE — Progress Notes (Signed)
Chaplain engaged in an initial visit with Jason Buchanan.  He welcomed chaplain visit and support.  Jason Buchanan voiced that he doesn't have any community around him.  Though he has a brother, they aren't close.  Chaplain let him know that she would check in on him and offer support.   Chaplain built rapport with Jason Buchanan and offered prayer over him.  Chaplain will follow-up.   Chaplain [name], MDiv  01/30/23 1000  Spiritual Encounters  Type of Visit Initial  Care provided to: Patient  Spiritual Framework  Presenting Themes Rituals and practive;Community and relationships;Courage hope and growth  Community/Connection None  Interventions  Spiritual Care Interventions Made Prayer;Established relationship of care and support;Compassionate presence;Encouragement  Intervention Outcomes  Outcomes Connection to spiritual care;Awareness of support

## 2023-01-30 NOTE — TOC Initial Note (Addendum)
Transition of Care [facility]) - Initial/Assessment Note    Patient Details  Name: Jason Buchanan MRN: [id number] Date of Birth: 1986/04/28  Transition of Care [facility]) CM/SW Contact:    Roseanne Kaufman, RN Phone Number: 01/30/2023, 1:14 PM  Clinical Narrative:     This RNCM consulted for SA resources and safe injection/needle exchange program. This RNCM spoke with patient at bedside to provide SA resources and provided GSTOP resources with locations and phone numbers to obtain safe injection, needle exchange. Patient verbalized understanding. This RNCM encouraged patient to call available resources based on his individualized needs. This RNCM explained this is something he has to do on his own. Prior to admission patient was staying at [facility] homeless shelter.   No current TOC needs at this time.              Expected Discharge Plan: Homeless Shelter Barriers to Discharge: Continued Medical Work up   Patient Goals and CMS Choice Patient states their goals for this hospitalization and ongoing recovery are:: get help with substance abuse CMS Medicare.gov Compare Post Acute Care list provided to:: Patient Choice offered to / list presented to : Patient [place] ownership interest in [facility].provided to:: Patient    Expected Discharge Plan and Services In-house Referral: NA Discharge Planning Services: CM Consult Post Acute Care Choice: NA Living arrangements for the past 2 months: Homeless                 DME Arranged: N/A DME Agency: NA       HH Arranged: NA HH Agency: NA        Prior Living Arrangements/Services Living arrangements for the past 2 months: Homeless Lives with:: Self Patient language and need for interpreter reviewed:: Yes Do you feel safe going back to the place where you live?: Yes      Need for Family Participation in Patient Care: No (Comment) Care giver support system in place?: No (comment)   Criminal Activity/Legal Involvement Pertinent to  Current Situation/Hospitalization: No - Comment as needed  Activities of Daily Living      Permission Sought/Granted Permission sought to share information with : Case Manager Permission granted to share information with : Yes, Verbal Permission Granted  Share Information with NAME: Case manager           Emotional Assessment Appearance:: Appears stated age Attitude/Demeanor/Rapport: Gracious Affect (typically observed): Accepting Orientation: : Oriented to Self, Oriented to Place, Oriented to  Time, Oriented to Situation Alcohol / Substance Use: Alcohol Use, Illicit Drugs (fentayl, meth, heroin) Psych Involvement: No (comment)  Admission diagnosis:  Acute psychosis ([place]) [F23] Cellulitis of left upper extremity [L03.114] Psychosis, unspecified psychosis type ([place]) [F29] Patient Active Problem List   Diagnosis Date Noted   Acute psychosis ([place]) 01/28/2023   Lactic acidosis 01/14/2023   Abscess of left hand 01/13/2023   Elevated transaminase level 01/13/2023   Mood disorder ([place]) 01/13/2023   Alcohol use 01/13/2023   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   Bacterial skin infection of upper extremity 12/04/2021   Protein-calorie malnutrition, severe 11/25/2021   Hepatitis C 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    Acute blood loss anemia 11/19/2021   Upper GI bleed 11/19/2021   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/19/2021   Right arm cellulitis    Sepsis ([place]) 11/14/2021   Abscess of dorsum of right hand 09/27/2021   Cellulitis and abscess of hand 09/27/2021   IVDU (intravenous drug user)  Sepsis due to cellulitis ([place]) 11/07/2017   Polysubstance abuse ([place]) 11/07/2017   Cocaine abuse ([place]) 11/07/2017   Hypokalemia 11/07/2017   PCP:  [name], NP Pharmacy:   [place] [address] Phone: [phone] Fax: [phone]     Social  Determinants of Health (SDOH) Social History: SDOH Screenings   Food Insecurity: No Food Insecurity (01/13/2023)  Housing: High Risk (01/13/2023)  Transportation Needs: Unmet Transportation Needs (01/13/2023)  Utilities: Not At Risk (01/15/2023)  Recent Concern: Utilities - At Risk (01/13/2023)  Tobacco Use: High Risk (01/14/2023)   SDOH Interventions:     Readmission Risk Interventions    01/15/2023   12:02 PM  Readmission Risk Prevention Plan  Transportation Screening   PCP or Specialist Appt within 3-5 Days   HRI or [place] Work Consult for [place] Planning/Counseling   [place] Screening   Medication Review Press photographer)      Information is confidential and restricted. Go to Review Flowsheets to unlock data.

## 2023-01-31 LAB — BASIC METABOLIC PANEL
Anion gap: 8 (ref 5–15)
BUN: 10 mg/dL (ref 6–20)
CO2: 26 mmol/L (ref 22–32)
Calcium: 8.4 mg/dL — ABNORMAL LOW (ref 8.9–10.3)
Chloride: 102 mmol/L (ref 98–111)
Creatinine, Ser: 0.55 mg/dL — ABNORMAL LOW (ref 0.61–1.24)
GFR, Estimated: >60 mL/min (ref >60)
Glucose, Bld: 117 mg/dL — ABNORMAL HIGH (ref 70–99)
Potassium: 3.6 mmol/L (ref 3.5–5.1)
Sodium: 136 mmol/L (ref 135–145)

## 2023-01-31 LAB — CBC WITH DIFFERENTIAL/PLATELET
Abs Immature Granulocytes: 0.02 10*3/uL (ref 0.00–0.07)
Basophils Absolute: 0.1 10*3/uL (ref 0.0–0.1)
Basophils Relative: 1 %
Eosinophils Absolute: 0.3 10*3/uL (ref 0.0–0.5)
Eosinophils Relative: 7 %
HCT: 38.1 % — ABNORMAL LOW (ref 39.0–52.0)
Hemoglobin: 12.2 g/dL — ABNORMAL LOW (ref 13.0–17.0)
Immature Granulocytes: 0 %
Lymphocytes Relative: 33 %
Lymphs Abs: 1.6 10*3/uL (ref 0.7–4.0)
MCH: 28.8 pg (ref 26.0–34.0)
MCHC: 32 g/dL (ref 30.0–36.0)
MCV: 90.1 fL (ref 80.0–100.0)
Monocytes Absolute: 0.6 10*3/uL (ref 0.1–1.0)
Monocytes Relative: 11 %
Neutro Abs: 2.4 10*3/uL (ref 1.7–7.7)
Neutrophils Relative %: 48 %
Platelets: 310 10*3/uL (ref 150–400)
RBC: 4.23 MIL/uL (ref 4.22–5.81)
RDW: 12.3 % (ref 11.5–15.5)
WBC: 5 10*3/uL (ref 4.0–10.5)
nRBC: 0 % (ref 0.0–0.2)

## 2023-01-31 LAB — MAGNESIUM: Magnesium: 1.8 mg/dL (ref 1.7–2.4)

## 2023-01-31 LAB — PHOSPHORUS: Phosphorus: 4 mg/dL (ref 2.5–4.6)

## 2023-01-31 MED ORDER — CEFAZOLIN SODIUM-DEXTROSE 2-4 GM/100ML-% IV SOLN
2.0000 g | Freq: Three times a day (TID) | INTRAVENOUS | Status: DC
  Administered 2023-01-31 – 2023-02-05 (×15): 2 g via INTRAVENOUS
  Filled 2023-01-31 (×15): qty 100

## 2023-01-31 NOTE — Progress Notes (Signed)
QTC 409 via monitor.

## 2023-01-31 NOTE — Consult Note (Signed)
[facility] for Infectious Disease  Total days of antibiotics 3 Reason for Consult: left hand wound/cellulitis    Referring Physician: xu  Principal Problem:   Acute psychosis ([place])    HPI: Jason Buchanan is a 37 y.o. male with history of polysubstance use, schizophrenia who was recently hospitalized for left hand abscess/cellulitis s/p I x D by dr Caralyn Guile on 2/10 OR cultures with strep intermedius. Concurrent blood cx were negative. He was discharged on doxy plus augmentin to take through 2/28. The patient states he was taking oral abtx but it his hands had worsen, more so his right dorsum of hand > left hand. He was admitted on 2/25 with running out of medications roughly on 2/22. In the ED, he was combative, hallucinating requiring precedex for sedation. Due to apparent cellulitis to hands, he was started on vancomycin and cefepime. 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 Had prolonged block (>30 min) after succinylcholine for GI procedure.    Chlorhexidine Other (See Comments)    Pt does not remember reaction    Naproxen Other (See Comments)    Stomach upset    MEDICATIONS:  buprenorphine-naloxone  1 tablet Sublingual BID   enoxaparin (LOVENOX) injection  40 mg Subcutaneous A999333   folic acid  1 mg Oral Daily   gabapentin  300 mg Oral TID   mupirocin ointment  1 Application Nasal BID   pantoprazole  40 mg Oral Daily   QUEtiapine  300 mg Oral QHS   senna  2 tablet Oral QHS   thiamine  100 mg Oral Daily     Social History   Tobacco Use   Smoking status: Some Days    Packs/day: 0.50    Types: Cigarettes   Smokeless tobacco: Never  Substance Use Topics   Alcohol use: Not Currently    Comment: occasionally   Drug use: Yes    Types: IV, Marijuana    Comment: heroin, meth    No family history on file.  Review of Systems -  +hand pain R> L. 12 point ros is negative  OBJECTIVE: Temp:  [97.8 F (36.6 C)-98.7 F (37.1 C)] 98.4 F (36.9 C) (02/28 1200) Pulse Rate:  [72-102] 87 (02/28 0600) Resp:  [16-20] 17 (02/28 0600) BP: (128-181)/(64-116) 128/75 (02/28 0600) SpO2:  [95 %-100 %] 96 % (02/28 0600) Weight:  [69.9 kg] 69.9 kg (02/28 0500) Physical Exam  Constitutional: He is oriented to person, place, and time. He appears well-developed and well-nourished. No distress.  HENT:  Mouth/Throat: Oropharynx is clear and moist. No oropharyngeal exudate.  Cardiovascular: Normal rate, regular rhythm and normal heart sounds. Exam reveals no gallop and no friction rub.  No murmur heard.  Pulmonary/Chest: Effort normal and breath sounds normal. No respiratory distress. He has no wheezes.  Abdominal: Soft. Bowel sounds are normal. He exhibits no distension. There is no tenderness.  Lymphadenopathy:  He has no cervical adenopathy.  Neurological: He is alert and oriented to person,  place, and time.  Skin: Skin is warm and dry. Some surrounding erythema around eschar on left dorsum of hand. And right hand tenderness Psychiatric: He has a normal mood and affect. His behavior is normal.   LABS: Results for orders placed or performed during the hospital encounter of 01/28/23 (from the past 48 hour(s))  Basic metabolic panel     Status: Abnormal   Collection Time: 01/30/23  3:30 AM  Result Value Ref Range   Sodium 138 135 - 145 mmol/L   Potassium 4.5 3.5 - 5.1 mmol/L   Chloride 110 98 - 111 mmol/L   CO2 20 (L) 22 - 32 mmol/L   Glucose, Bld 119 (H) 70 - 99 mg/dL    Comment: Glucose  reference range applies only to samples taken after fasting for at least 8 hours.   BUN 7 6 - 20 mg/dL   Creatinine, Ser 0.60 (L) 0.61 - 1.24 mg/dL   Calcium 8.0 (L) 8.9 - 10.3 mg/dL   GFR, Estimated >60 >60 mL/min    Comment: (NOTE) Calculated using the CKD-EPI Creatinine Equation (2021)    Anion gap 8 5 - 15    Comment: Performed at [facility], [place] [address] [place], [place] [address]  Magnesium     Status: Abnormal   Collection Time: 01/30/23  3:30 AM  Result Value Ref Range   Magnesium 1.5 (L) 1.7 - 2.4 mg/dL    Comment: Performed at [facility], [place] [address], [place], [place] [address]  CBC     Status: Abnormal   Collection Time: 01/30/23  3:30 AM  Result Value Ref Range   WBC 4.1 4.0 - 10.5 K/uL   RBC 3.74 (L) 4.22 - 5.81 MIL/uL   Hemoglobin 11.0 (L) 13.0 - 17.0 g/dL   HCT 33.5 (L) 39.0 - 52.0 %   MCV 89.6 80.0 - 100.0 fL   MCH 29.4 26.0 - 34.0 pg   MCHC 32.8 30.0 - 36.0 g/dL   RDW 12.4 11.5 - 15.5 %   Platelets 254 150 - 400 K/uL   nRBC 0.0 0.0 - 0.2 %    Comment: Performed at [facility], [place] [address] [place], [place] [address]  Glucose, capillary     Status: Abnormal   Collection Time: 01/30/23  4:01 PM  Result Value Ref Range   Glucose-Capillary 116 (H) 70 - 99 mg/dL    Comment: Glucose reference range applies only to samples taken after fasting for at least 8 hours.   Comment 1 Notify RN    Comment 2 Document in Chart   CBC with Differential/Platelet     Status: Abnormal   Collection Time: 01/31/23  3:03 AM  Result Value Ref Range   WBC 5.0 4.0 - 10.5 K/uL   RBC 4.23 4.22 - 5.81 MIL/uL   Hemoglobin 12.2 (L) 13.0 - 17.0 g/dL   HCT 38.1 (L) 39.0 - 52.0 %   MCV 90.1 80.0 - 100.0 fL   MCH 28.8 26.0 - 34.0 pg   MCHC 32.0 30.0 - 36.0 g/dL   RDW 12.3 11.5 - 15.5 %   Platelets 310 150 - 400 K/uL   nRBC 0.0 0.0 - 0.2 %   Neutrophils Relative % 48 %   Neutro Abs 2.4 1.7 - 7.7 K/uL   Lymphocytes  Relative 33 %   Lymphs Abs 1.6 0.7 - 4.0 K/uL   Monocytes Relative 11 %   Monocytes Absolute 0.6 0.1 - 1.0 K/uL  Eosinophils Relative 7 %   Eosinophils Absolute 0.3 0.0 - 0.5 K/uL   Basophils Relative 1 %   Basophils Absolute 0.1 0.0 - 0.1 K/uL   Immature Granulocytes 0 %   Abs Immature Granulocytes 0.02 0.00 - 0.07 K/uL    Comment: Performed at [facility], [place] [address] [place], [place] [id number]  Basic metabolic panel     Status: Abnormal   Collection Time: 01/31/23  3:03 AM  Result Value Ref Range   Sodium 136 135 - 145 mmol/L   Potassium 3.6 3.5 - 5.1 mmol/L   Chloride 102 98 - 111 mmol/L   CO2 26 22 - 32 mmol/L   Glucose, Bld 117 (H) 70 - 99 mg/dL    Comment: Glucose reference range applies only to samples taken after fasting for at least 8 hours.   BUN 10 6 - 20 mg/dL   Creatinine, Ser 0.55 (L) 0.61 - 1.24 mg/dL   Calcium 8.4 (L) 8.9 - 10.3 mg/dL   GFR, Estimated >60 >60 mL/min    Comment: (NOTE) Calculated using the CKD-EPI Creatinine Equation (2021)    Anion gap 8 5 - 15    Comment: Performed at [facility], [place] [address], [place], [place] [address]  Magnesium     Status: None   Collection Time: 01/31/23  3:03 AM  Result Value Ref Range   Magnesium 1.8 1.7 - 2.4 mg/dL    Comment: Performed at [facility], [place] [address] [place], [place] [address]  Phosphorus     Status: None   Collection Time: 01/31/23  3:03 AM  Result Value Ref Range   Phosphorus 4.0 2.5 - 4.6 mg/dL    Comment: Performed at [facility], [place] [address] [place], [place] [address]    MICRO: 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  HISTORICAL MICRO/IMAGING  Assessment/Plan:  37yo M with cellulitis possibly related to injection and concern for abscess to right dorsum of hand - recommend to get CT imaging of right hand with contrast - will narrow abtx to cefazolin for the timebeing. Will  continue to monitor response.

## 2023-01-31 NOTE — Progress Notes (Signed)
[facility]                                                                               Jason Buchanan, is a 37 y.o. male, DOB - [birth year], SX:[id number] Admit date - 01/28/2023    Outpatient Primary MD for the patient is [name], [name], NP  LOS - 3  days    Brief summary   37 year old male with a past medical history significant for narcotic abuse, schizophrenia, IV drug abuse and recent left hand abscess and tenosynovitis requiring debridement by Dr. Apolonio Schneiders on January 13, 2023 presented  to the [facility] emergency department complaining of pain in his left hand. The patient had recently been discharged from this facility on February 13 after admission, surgical management of left hand abscess and tenosynovitis. He was discharged on muscle relaxants, narcotics for pain medicine. He returned to the emergency department complaining of hand pain and stated that he had recently been injecting IV fentanyl and heroin.   He was admitted by [facility] and was on precedex, off precedex   Assessment & Plan     Left hand cellulitis, recent tenosynovitis and abscess:  S/p I/d during last hospitalization On 2/10  Seen by ortho Dr Caralyn Guile on 2/26 who recommended abx treatment, no need of surgical  intervention.   blood cultures no growth Wbc count wnl.  Has been on vanc and cefepime ID consulted, will follow recommendation   Hypokalemia /hypomagnesemia Replaced, improved, continue monitor  Mild hyponatremia, resolved  Mild normocytic anemia:  Hemoglobin stable 11-12    H/o acute psychosis in the setting schizophrenia, Non compliance to meds.  H/o narcotic and methamphetamine abuse:  Initially required Precedex drip and admitted to icu due to severe agitation Weaned off precedex.  Continue with Seroquel and haldol.  Improving, transfer out of ICU   H/o polysubstance abuse;  UDS is positive for tetrahydrocannabinol and amphetamines Would benefit from referral to  pain clinic.     Chronic pain  He was started on suboxone by PCCM this admission.      Estimated body mass index is 24.87 kg/m as calculated from the following:   Height as of this encounter: '5\' 6"'$  (1.676 m).   Weight as of this encounter: 69.9 kg.  Code Status: full code.  DVT Prophylaxis:  enoxaparin (LOVENOX) injection 40 mg Start: 01/28/23 2000   Family Communication: none at bedside.   Disposition Plan:     transfer out of icu, currently on iv abx, transitional care assisting for disposition ( homeless , psychiatric needs, [facility]?)  Procedures:  None.   Consultants:   PCCM Dr Caralyn Guile with Orthopedics.  ID  Antimicrobials:   Anti-infectives (From admission, onward)    Start     Dose/Rate Route Frequency Ordered Stop   01/29/23 2200  vancomycin (VANCOREADY) IVPB 1250 mg/250 mL        1,250 mg 166.7 mL/hr over 90 Minutes Intravenous Every 12 hours 01/29/23 1000     01/29/23 1045  vancomycin (VANCOREADY) IVPB 1500 mg/300 mL        1,500 mg 150 mL/hr over 120 Minutes Intravenous  Once 01/29/23 1000 01/29/23 1431   01/28/23  2200  ceFEPIme (MAXIPIME) 2 g in sodium chloride 0.9 % 100 mL IVPB        2 g 200 mL/hr over 30 Minutes Intravenous Every 8 hours 01/28/23 1519     01/28/23 1400  vancomycin (VANCOREADY) IVPB 1250 mg/250 mL        1,250 mg 166.7 mL/hr over 90 Minutes Intravenous  Once 01/28/23 1358 01/28/23 1651   01/28/23 1400  ceFEPIme (MAXIPIME) 2 g in sodium chloride 0.9 % 100 mL IVPB        2 g 200 mL/hr over 30 Minutes Intravenous  Once 01/28/23 1358 01/28/23 1516        Medications  Scheduled Meds:  buprenorphine-naloxone  1 tablet Sublingual BID   enoxaparin (LOVENOX) injection  40 mg Subcutaneous [id number]   folic acid  1 mg Oral Daily   gabapentin  300 mg Oral TID   mupirocin ointment  1 Application Nasal BID   pantoprazole  40 mg Oral Daily   QUEtiapine  300 mg Oral QHS   senna  2 tablet Oral QHS   thiamine  100 mg Oral Daily   Continuous  Infusions:  ceFEPime (MAXIPIME) IV Stopped (01/31/23 0555)   lactated ringers 100 mL/hr at 01/31/23 1133   vancomycin Stopped (01/31/23 1118)   PRN Meds:.acetaminophen, buprenorphine-naloxone, docusate sodium, haloperidol lactate, melatonin, ondansetron (ZOFRAN) IV, polyethylene glycol    Subjective:   Jason Buchanan was seen and examined today.   Requesting anxiety medication  Objective:   Vitals:   01/31/23 0400 01/31/23 0500 01/31/23 0600 01/31/23 0712  BP: 136/84 128/85 128/75   Pulse: 89 84 87   Resp: '16 18 17   '$ Temp: 97.8 F (36.6 C)   98.4 F (36.9 C)  TempSrc: Oral   Oral  SpO2: 96% 96% 96%   Weight:  69.9 kg    Height:        Intake/Output Summary (Last 24 hours) at 01/31/2023 1317 Last data filed at 01/31/2023 1133 Gross per 24 hour  Intake 3130.88 ml  Output 5400 ml  Net -2269.12 ml   Filed Weights   01/28/23 1630 01/30/23 0804 01/31/23 0500  Weight: 68.5 kg 71 kg 69.9 kg     Exam General exam: Appears calm and comfortable  Respiratory system: Clear to auscultation. Respiratory effort normal. Cardiovascular system: S1 & S2 heard, RRR. No JVD, murmurs,  Gastrointestinal system: Abdomen is nondistended, soft and nontender.  Central nervous system: Alert and oriented. No focal neurological deficits. Extremities: left hand pic below Skin: No rashes, Psychiatry: Mood & affect appropriate.    Data Reviewed:  I have personally reviewed following labs and imaging studies   CBC Lab Results  Component Value Date   WBC 5.0 01/31/2023   RBC 4.23 01/31/2023   HGB 12.2 (L) 01/31/2023   HCT 38.1 (L) 01/31/2023   MCV 90.1 01/31/2023   MCH 28.8 01/31/2023   PLT 310 01/31/2023   MCHC 32.0 01/31/2023   RDW 12.3 01/31/2023   LYMPHSABS 1.6 01/31/2023   MONOABS 0.6 01/31/2023   EOSABS 0.3 01/31/2023   BASOSABS 0.1 [id number]     Last metabolic panel Lab Results  Component Value Date   NA 136 01/31/2023   K 3.6 01/31/2023   CL 102 01/31/2023   CO2 26  01/31/2023   BUN 10 01/31/2023   CREATININE 0.55 (L) 01/31/2023   GLUCOSE 117 (H) 01/31/2023   GFRNONAA >60 01/31/2023   GFRAA >60 11/09/2017   CALCIUM 8.4 (L) 01/31/2023   PHOS 4.0  01/31/2023   PROT 7.0 01/28/2023   ALBUMIN 4.0 01/28/2023   BILITOT 1.3 (H) 01/28/2023   ALKPHOS 110 01/28/2023   AST 41 01/28/2023   ALT 38 01/28/2023   ANIONGAP 8 01/31/2023    CBG (last 3)  Recent Labs    01/30/23 1601  GLUCAP 116*      Coagulation Profile: No results for input(s): "INR", "PROTIME" in the last 168 hours.   Radiology Studies: No results found.     Florencia Reasons MD PhD FACP [facility] 01/31/2023, 1:17 PM  Available via Epic secure chat 7am-7pm After 7 pm, please refer to night coverage provider listed on amion.

## 2023-02-01 ENCOUNTER — Inpatient Hospital Stay (HOSPITAL_COMMUNITY): Payer: Self-pay

## 2023-02-01 MED ORDER — IOHEXOL 300 MG/ML  SOLN
75.0000 mL | Freq: Once | INTRAMUSCULAR | Status: AC | PRN
  Administered 2023-02-01: 75 mL via INTRAVENOUS

## 2023-02-01 NOTE — Progress Notes (Signed)
ID PROGRESS NOTE  Patient remains afebrile. He underwent bilateral hand CT that ruled out any abscesses that would need further debridement.  A/P: continue on cefazolin Consider to transition to oral abtx in the morning for dispo  Tylia Ewell B. [place] [facility] [phone]

## 2023-02-01 NOTE — Progress Notes (Signed)
[facility]                                                                               Jason Buchanan, is a 37 y.o. male, DOB - 1986/03/20, SX:[id number] Admit date - 01/28/2023    Outpatient Primary MD for the patient is [name], [name], NP  LOS - 4  days    Brief summary   37 year old male with a past medical history significant for narcotic abuse, schizophrenia, IV drug abuse and recent left hand abscess and tenosynovitis requiring debridement by Dr. Apolonio Schneiders on January 13, 2023 presented  to the [facility] emergency department complaining of pain in his left hand. The patient had recently been discharged from this facility on February 13 after admission, surgical management of left hand abscess and tenosynovitis. He was discharged on muscle relaxants, narcotics for pain medicine. He returned to the emergency department complaining of hand pain and stated that he had recently been injecting IV fentanyl and heroin.   He was admitted by [facility] and was on precedex, off precedex   Assessment & Plan     Left hand cellulitis, recent tenosynovitis and abscess: ( reports heroin use) S/p I/d during last hospitalization On 2/10  Seen by ortho Dr Caralyn Guile on 2/26 who recommended abx treatment, no need of surgical  intervention.   blood cultures no growth Wbc count wnl.  Has been on vanc and cefepime, seen by ID who recommended ct bilateral hands, abx changed to ancef,  will  ID follow recommendation   Hypokalemia /hypomagnesemia Replaced, improved, continue monitor  Mild hyponatremia, resolved  Mild normocytic anemia:  Hemoglobin stable 11-12    H/o acute psychosis in the setting schizophrenia, Non compliance to meds.  H/o narcotic (heroin) and methamphetamine abuse:   Initially required Precedex drip and admitted to icu due to severe agitation Weaned off precedex.  Continue with Seroquel and haldol.  Improving, transfer out of ICU   H/o polysubstance abuse;   UDS is positive for tetrahydrocannabinol and amphetamines, heroin won't show on UDS, reports last use on day of admission He desires to talk to psychiatry, he desires to go to detox facility     Chronic pain  He was started on suboxone by PCCM this admission.  Detox facility/chronic pain clinic? Psychiatry consulted     Estimated body mass index is 25.37 kg/m as calculated from the following:   Height as of this encounter: '5\' 6"'$  (1.676 m).   Weight as of this encounter: 71.3 kg.  Code Status: full code.  DVT Prophylaxis:  enoxaparin (LOVENOX) injection 40 mg Start: 01/28/23 2000   Family Communication: none at bedside.   Disposition Plan:     transfer out of icu, currently on iv abx, need ID clearance, need psychiatry consult, transitional care assisting for disposition ( homeless , psychiatric needs, [facility]?)  Procedures:  None.   Consultants:   PCCM Dr Caralyn Guile with Orthopedics.  ID Psychiatry  TOC  Antimicrobials:   Anti-infectives (From admission, onward)    Start     Dose/Rate Route Frequency Ordered Stop   01/31/23 2200  ceFAZolin (ANCEF) IVPB 2g/100 mL premix  2 g 200 mL/hr over 30 Minutes Intravenous Every 8 hours 01/31/23 1418     01/29/23 2200  vancomycin (VANCOREADY) IVPB 1250 mg/250 mL  Status:  Discontinued        1,250 mg 166.7 mL/hr over 90 Minutes Intravenous Every 12 hours 01/29/23 1000 01/31/23 1417   01/29/23 1045  vancomycin (VANCOREADY) IVPB 1500 mg/300 mL        1,500 mg 150 mL/hr over 120 Minutes Intravenous  Once 01/29/23 1000 01/29/23 1431   01/28/23 2200  ceFEPIme (MAXIPIME) 2 g in sodium chloride 0.9 % 100 mL IVPB  Status:  Discontinued        2 g 200 mL/hr over 30 Minutes Intravenous Every 8 hours 01/28/23 1519 01/31/23 1417   01/28/23 1400  vancomycin (VANCOREADY) IVPB 1250 mg/250 mL        1,250 mg 166.7 mL/hr over 90 Minutes Intravenous  Once 01/28/23 1358 01/28/23 1651   01/28/23 1400  ceFEPIme (MAXIPIME) 2 g in sodium  chloride 0.9 % 100 mL IVPB        2 g 200 mL/hr over 30 Minutes Intravenous  Once 01/28/23 1358 01/28/23 1516        Medications  Scheduled Meds:  buprenorphine-naloxone  1 tablet Sublingual BID   enoxaparin (LOVENOX) injection  40 mg Subcutaneous [id number]   folic acid  1 mg Oral Daily   gabapentin  300 mg Oral TID   mupirocin ointment  1 Application Nasal BID   pantoprazole  40 mg Oral Daily   QUEtiapine  300 mg Oral QHS   senna  2 tablet Oral QHS   thiamine  100 mg Oral Daily   Continuous Infusions:   ceFAZolin (ANCEF) IV 2 g (02/01/23 1436)   lactated ringers Stopped (02/01/23 1050)   PRN Meds:.acetaminophen, docusate sodium, haloperidol lactate, melatonin, ondansetron (ZOFRAN) IV, polyethylene glycol    Subjective:   Jason Buchanan was seen and examined today.   He is sitting up in chair, reports feeling better  Reports use heroin, denies use of amphetamines, was brought in from the street due to bizarre behaviors, desires to go to treatment center  Denies SI or HI   Objective:   Vitals:   02/01/23 0800 02/01/23 0810 02/01/23 0900 02/01/23 1000  BP: 119/77  139/82 (!) 146/94  Pulse: 79 79 82 (!) 142  Resp: '14 14 14 20  '$ Temp:  98 F (36.7 C)    TempSrc:  Oral    SpO2: 95% 95% 97% 96%  Weight:      Height:        Intake/Output Summary (Last 24 hours) at 02/01/2023 1534 Last data filed at 02/01/2023 1200 Gross per 24 hour  Intake 2115.18 ml  Output 4300 ml  Net -2184.82 ml   Filed Weights   01/30/23 0804 01/31/23 0500 02/01/23 0415  Weight: 71 kg 69.9 kg 71.3 kg     Exam General exam: Appears calm and comfortable  Respiratory system: Clear to auscultation. Respiratory effort normal. Cardiovascular system: S1 & S2 heard, RRR. No JVD, murmurs,  Gastrointestinal system: Abdomen is nondistended, soft and nontender.  Central nervous system: Alert and oriented. No focal neurological deficits. Extremities: left hand wound is improving, right dorsal hand  less swollen Skin: No rashes, Psychiatry: Mood & affect appropriate.    Data Reviewed:  I have personally reviewed following labs and imaging studies   CBC Lab Results  Component Value Date   WBC 5.0 01/31/2023   RBC 4.23 01/31/2023   HGB  12.2 (L) 01/31/2023   HCT 38.1 (L) 01/31/2023   MCV 90.1 01/31/2023   MCH 28.8 01/31/2023   PLT 310 01/31/2023   MCHC 32.0 01/31/2023   RDW 12.3 01/31/2023   LYMPHSABS 1.6 01/31/2023   MONOABS 0.6 01/31/2023   EOSABS 0.3 01/31/2023   BASOSABS 0.1 [id number]     Last metabolic panel Lab Results  Component Value Date   NA 136 01/31/2023   K 3.6 01/31/2023   CL 102 01/31/2023   CO2 26 01/31/2023   BUN 10 01/31/2023   CREATININE 0.55 (L) 01/31/2023   GLUCOSE 117 (H) 01/31/2023   GFRNONAA >60 01/31/2023   GFRAA >60 11/09/2017   CALCIUM 8.4 (L) 01/31/2023   PHOS 4.0 01/31/2023   PROT 7.0 01/28/2023   ALBUMIN 4.0 01/28/2023   BILITOT 1.3 (H) 01/28/2023   ALKPHOS 110 01/28/2023   AST 41 01/28/2023   ALT 38 01/28/2023   ANIONGAP 8 01/31/2023    CBG (last 3)  Recent Labs    01/30/23 1601  GLUCAP 116*      Coagulation Profile: No results for input(s): "INR", "PROTIME" in the last 168 hours.   Radiology Studies: CT HAND RIGHT W CONTRAST  Result Date: 02/01/2023 CLINICAL DATA:  Soft tissue infection suspected, hand, xray done per ID recommendation rule out right hand dorsal abscess EXAM: CT OF THE UPPER RIGHT EXTREMITY WITH CONTRAST TECHNIQUE: Multidetector CT imaging of the upper right extremity was performed according to the standard protocol following intravenous contrast administration. RADIATION DOSE REDUCTION: This exam was performed according to the departmental dose-optimization program which includes automated exposure control, adjustment of the mA and/or kV according to patient size and/or use of iterative reconstruction technique. CONTRAST:  19m OMNIPAQUE IOHEXOL 300 MG/ML  SOLN COMPARISON:  Radiograph 01/12/2022, CT  11/14/2021, hand radiograph 09/27/2021 FINDINGS: Bones/Joint/Cartilage There is no evidence of acute fracture. No focal bone lesion. There is no bone erosion or periostitis. No frank bony destruction. Ligaments Suboptimally assessed by CT. Muscles and Tendons No acute myotendinous abnormality by CT. No evidence of intramuscular collection. Soft tissues There is skin thickening soft tissue swelling of the hand most prominent dorsally. There is no evidence of a well-defined/drainable fluid collection. IMPRESSION: Skin thickening and soft tissue swelling of the right hand, most prominent dorsally, as can be seen in cellulitis. No evidence of soft tissue abscess by CT. Electronically Signed   By: JMaurine SimmeringM.D.   On: 02/01/2023 13:52       FFlorencia ReasonsMD PhD FACP [facility] 02/01/2023, 3:34 PM  Available via Epic secure chat 7am-7pm After 7 pm, please refer to night coverage provider listed on amion.
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
## 2023-02-01 NOTE — Progress Notes (Signed)
Report called to Clare on [place].

## 2023-02-02 LAB — CULTURE, BLOOD (ROUTINE X 2)
Culture: NO GROWTH
Culture: NO GROWTH
Special Requests: ADEQUATE

## 2023-02-02 MED ORDER — MAGNESIUM SULFATE IN D5W 1-5 GM/100ML-% IV SOLN
1.0000 g | Freq: Once | INTRAVENOUS | Status: AC
  Administered 2023-02-02: 1 g via INTRAVENOUS
  Filled 2023-02-02: qty 100

## 2023-02-02 MED ORDER — POTASSIUM CHLORIDE CRYS ER 20 MEQ PO TBCR
40.0000 meq | EXTENDED_RELEASE_TABLET | Freq: Once | ORAL | Status: AC
  Administered 2023-02-02: 40 meq via ORAL
  Filled 2023-02-02: qty 2

## 2023-02-02 NOTE — Progress Notes (Signed)
Mobility Specialist Cancellation/Refusal Note:  Pt declined mobility 2x today due to not feeling well.  Will check back as schedule permits.    [facility]

## 2023-02-02 NOTE — Progress Notes (Signed)
[facility]                                                                               Jason Buchanan, is a 37 y.o. male, DOB - 26-Apr-1986, SX:[id number] Admit date - 01/28/2023    Outpatient Primary MD for the patient is [name], [name], NP  LOS - 5  days    Brief summary   37 year old male with a past medical history significant for narcotic abuse, schizophrenia, IV drug abuse and recent left hand abscess and tenosynovitis requiring debridement by Dr. Apolonio Schneiders on January 13, 2023 presented  to the [facility] emergency department complaining of pain in his left hand. The patient had recently been discharged from this facility on February 13 after admission, surgical management of left hand abscess and tenosynovitis. He was discharged on muscle relaxants, narcotics for pain medicine. He returned to the emergency department complaining of hand pain and stated that he had recently been injecting IV fentanyl and heroin.   He was admitted by [facility] and was on precedex, off precedex   Assessment & Plan     Left hand cellulitis, recent tenosynovitis and abscess: ( reports heroin use) S/p I/d during last hospitalization On 2/10  Seen by ortho Dr Caralyn Guile on 2/26 who recommended abx treatment, no need of surgical  intervention.   blood cultures no growth Wbc count wnl.  Has been on vanc and cefepime, seen by ID who recommended ct bilateral hands on  2/29 no abscess ,abx changed to ancef,  will  ID follow recommendation   Hypokalemia /hypomagnesemia Continue to Replace, improved, continue monitor  Mild hyponatremia, resolved  Mild normocytic anemia:  Hemoglobin stable 11-12    H/o acute psychosis in the setting schizophrenia, Non compliance to meds.  H/o narcotic (heroin) and methamphetamine abuse:  Initially required ic admission on Precedex drip  due to severe agitation Weaned off precedex , out of icu on 2/29 Seen by psych ,Continue with Seroquel and haldol.      H/o polysubstance abuse;  UDS is positive for tetrahydrocannabinol and amphetamines, heroin won't show on UDS, reports last use on day of admission He desires to talk to psychiatry, he desires to go to detox facility Seen by psych who recommended "Patient will benefit from Door to Door Transfer at this time. He has been in communication with [facility]. Patient will likely relapse if discharged, encourage timely communication and admission intake completion asap.  " TOC made aware    Chronic pain  He was started on suboxone by PCCM this admission.  Detox facility/chronic pain clinic? Psychiatry consulted     Estimated body mass index is 24.84 kg/m as calculated from the following:   Height as of this encounter: '5\' 6"'$  (1.676 m).   Weight as of this encounter: 69.8 kg.  Code Status: full code.  DVT Prophylaxis:  enoxaparin (LOVENOX) injection 40 mg Start: 01/28/23 2000   Family Communication: none at bedside.   Disposition Plan:     medically stable to discharge, Patient will benefit from Door to Door Transfer at this time. He has been in communication with [facility]. Patient will likely relapse if discharged, encourage timely communication and  admission intake completion asap.    Procedures:  None.   Consultants:   PCCM Dr Caralyn Guile with Orthopedics.  ID Psychiatry  TOC  Antimicrobials:   Anti-infectives (From admission, onward)    Start     Dose/Rate Route Frequency Ordered Stop   01/31/23 2200  ceFAZolin (ANCEF) IVPB 2g/100 mL premix        2 g 200 mL/hr over 30 Minutes Intravenous Every 8 hours 01/31/23 1418     01/29/23 2200  vancomycin (VANCOREADY) IVPB 1250 mg/250 mL  Status:  Discontinued        1,250 mg 166.7 mL/hr over 90 Minutes Intravenous Every 12 hours 01/29/23 1000 01/31/23 1417   01/29/23 1045  vancomycin (VANCOREADY) IVPB 1500 mg/300 mL        1,500 mg 150 mL/hr over 120 Minutes Intravenous  Once 01/29/23 1000 01/29/23 1431   01/28/23 2200  ceFEPIme  (MAXIPIME) 2 g in sodium chloride 0.9 % 100 mL IVPB  Status:  Discontinued        2 g 200 mL/hr over 30 Minutes Intravenous Every 8 hours 01/28/23 1519 01/31/23 1417   01/28/23 1400  vancomycin (VANCOREADY) IVPB 1250 mg/250 mL        1,250 mg 166.7 mL/hr over 90 Minutes Intravenous  Once 01/28/23 1358 01/28/23 1651   01/28/23 1400  ceFEPIme (MAXIPIME) 2 g in sodium chloride 0.9 % 100 mL IVPB        2 g 200 mL/hr over 30 Minutes Intravenous  Once 01/28/23 1358 01/28/23 1516        Medications  Scheduled Meds:  buprenorphine-naloxone  1 tablet Sublingual BID   enoxaparin (LOVENOX) injection  40 mg Subcutaneous [id number]   folic acid  1 mg Oral Daily   gabapentin  300 mg Oral TID   mupirocin ointment  1 Application Nasal BID   pantoprazole  40 mg Oral Daily   potassium chloride  40 mEq Oral Once   QUEtiapine  300 mg Oral QHS   senna  2 tablet Oral QHS   thiamine  100 mg Oral Daily   Continuous Infusions:   ceFAZolin (ANCEF) IV 2 g (02/02/23 0506)   lactated ringers 100 mL/hr at 02/01/23 2234   magnesium sulfate bolus IVPB     PRN Meds:.acetaminophen, docusate sodium, haloperidol lactate, melatonin, ondansetron (ZOFRAN) IV, polyethylene glycol    Subjective:   Decklyn Hains was seen and examined today.   He is sitting up in chair, reports feeling better  Denies SI or HI   Objective:   Vitals:   02/01/23 1628 02/01/23 1934 02/02/23 0441 02/02/23 0507  BP: (!) 149/90 (!) 150/78  120/76  Pulse: 91 91  93  Resp: '16 18  17  '$ Temp: 98.1 F (36.7 C) 97.8 F (36.6 C)  (!) 97.3 F (36.3 C)  TempSrc: Oral Oral  Oral  SpO2: 98% 98%  96%  Weight:   69.8 kg   Height:        Intake/Output Summary (Last 24 hours) at 02/02/2023 0901 Last data filed at 02/02/2023 0506 Gross per 24 hour  Intake 1596.84 ml  Output 1000 ml  Net 596.84 ml   Filed Weights   02/01/23 0415 02/01/23 1625 02/02/23 0441  Weight: 71.3 kg 66.9 kg 69.8 kg     Exam General exam: Appears calm and  comfortable  Respiratory system: Clear to auscultation. Respiratory effort normal. Cardiovascular system: S1 & S2 heard, RRR. No JVD, murmurs,  Gastrointestinal system: Abdomen is nondistended, soft and nontender.  Central nervous system: Alert and oriented. No focal neurological deficits. Extremities: left hand wound is improving, right dorsal hand less swollen Skin: No rashes, Psychiatry: Mood & affect appropriate.    Data Reviewed:  I have personally reviewed following labs and imaging studies   CBC Lab Results  Component Value Date   WBC 5.0 01/31/2023   RBC 4.23 01/31/2023   HGB 12.2 (L) 01/31/2023   HCT 38.1 (L) 01/31/2023   MCV 90.1 01/31/2023   MCH 28.8 01/31/2023   PLT 310 01/31/2023   MCHC 32.0 01/31/2023   RDW 12.3 01/31/2023   LYMPHSABS 1.6 01/31/2023   MONOABS 0.6 01/31/2023   EOSABS 0.3 01/31/2023   BASOSABS 0.1 [id number]     Last metabolic panel Lab Results  Component Value Date   NA 136 01/31/2023   K 3.6 01/31/2023   CL 102 01/31/2023   CO2 26 01/31/2023   BUN 10 01/31/2023   CREATININE 0.55 (L) 01/31/2023   GLUCOSE 117 (H) 01/31/2023   GFRNONAA >60 01/31/2023   GFRAA >60 11/09/2017   CALCIUM 8.4 (L) 01/31/2023   PHOS 4.0 01/31/2023   PROT 7.0 01/28/2023   ALBUMIN 4.0 01/28/2023   BILITOT 1.3 (H) 01/28/2023   ALKPHOS 110 01/28/2023   AST 41 01/28/2023   ALT 38 01/28/2023   ANIONGAP 8 01/31/2023    CBG (last 3)  Recent Labs    01/30/23 1601  GLUCAP 116*      Coagulation Profile: No results for input(s): "INR", "PROTIME" in the last 168 hours.   Radiology Studies: CT HAND LEFT W CONTRAST  Result Date: 02/01/2023 CLINICAL DATA:  Soft tissue infection suspected, hand, xray done EXAM: CT OF THE UPPER LEFT EXTREMITY WITH CONTRAST TECHNIQUE: Multidetector CT imaging of the upper left extremity was performed according to the standard protocol following intravenous contrast administration. RADIATION DOSE REDUCTION: This exam was performed  according to the departmental dose-optimization program which includes automated exposure control, adjustment of the mA and/or kV according to patient size and/or use of iterative reconstruction technique. CONTRAST:  22m OMNIPAQUE IOHEXOL 300 MG/ML  SOLN COMPARISON:  Left hand radiograph 01/28/2023, 01/13/2023, 07/13/2022, 11/14/2021 FINDINGS: Bones/Joint/Cartilage There is no evidence of acute fracture. There is articular surface irregularity/erosive changes of the intercarpal joints with bony fusion in the distal carpal row and bony fusion with the scaphoid. There is also bony fusion of the second and third CMC joints. 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 This was noted on prior radiograph in August 2023 and new in comparison to December 2022. Old healed distal fifth metacarpal fracture. Ligaments Suboptimally assessed by CT. Muscles and Tendons There is no acute myotendinous abnormality by CT. There is perching of the ECU tendon at the ulnar edge of the groove. Soft tissues There is diffuse soft tissue swelling of the hand with focal skin irregularity overlying the dorsal aspect of the radial hand near the base of thumb. There is no well-defined/organized fluid collection by CT. There is skin thickening of the hand. There is no soft tissue gas. IMPRESSION: Diffuse soft tissue swelling of the hand, as can be seen in cellulitis. Focal skin irregularity overlying the dorsal aspect of the radial hand near the base of thumb. No well-defined/organized fluid collection by CT. No soft tissue gas. Extensive erosive changes of the intercarpal joints with bony fusion in the distal carpal row, second and third CMC joints, and the scaphoid. These findings could be chronic posttraumatic change or sequela of  prior septic arthritis. Ongoing joint infection is a possibility, though osseous changes are similar to prior radiographs. Electronically Signed   By:  Maurine Simmering M.D.   On: 02/01/2023 16:41   CT HAND RIGHT W CONTRAST  Result Date: 02/01/2023 CLINICAL DATA:  Soft tissue infection suspected, hand, xray done per ID recommendation rule out right hand dorsal abscess EXAM: CT OF THE UPPER RIGHT EXTREMITY WITH CONTRAST TECHNIQUE: Multidetector CT imaging of the upper right extremity was performed according to the standard protocol following intravenous contrast administration. RADIATION DOSE REDUCTION: This exam was performed according to the departmental dose-optimization program which includes automated exposure control, adjustment of the mA and/or kV according to patient size and/or use of iterative reconstruction technique. CONTRAST:  25m OMNIPAQUE IOHEXOL 300 MG/ML  SOLN COMPARISON:  Radiograph 01/12/2022, CT 11/14/2021, hand radiograph 09/27/2021 FINDINGS: Bones/Joint/Cartilage There is no evidence of acute fracture. No focal bone lesion. There is no bone erosion or periostitis. No frank bony destruction. Ligaments Suboptimally assessed by CT. Muscles and Tendons No acute myotendinous abnormality by CT. No evidence of intramuscular collection. Soft tissues There is skin thickening soft tissue swelling of the hand most prominent dorsally. There is no evidence of a well-defined/drainable fluid collection. IMPRESSION: Skin thickening and soft tissue swelling of the right hand, most prominent dorsally, as can be seen in cellulitis. No evidence of soft tissue abscess by CT. Electronically Signed   By: JMaurine SimmeringM.D.   On: 02/01/2023 13:52       FFlorencia ReasonsMD PhD FACP [facility] 02/02/2023, 9:01 AM  Available via Epic secure chat 7am-7pm After 7 pm, please refer to night coverage provider listed on amion.

## 2023-02-02 NOTE — Progress Notes (Addendum)
[facility] for Infectious [facility]    Date of Admission:  01/28/2023   Total days of antibiotics 5   ID: Jason Buchanan is a 37 y.o. male with  bilateral cellulitis/SSTI of dorsums of hand Principal Problem:   Amphetamine and psychostimulant-induced mood disorder (HCC) Active Problems:   Polysubstance abuse ([place])   IVDU (intravenous drug user)   Acute psychosis ([place])    Subjective: Swelling to both hands improved. Left hand still slightly swollen and tender but right hand much improved. Tolerating iv abtx  Education officer, museum trying to arrange for door to door transfer to [facility] to help minimize relapse which  the patient is interested in participating  Medications:   buprenorphine-naloxone  1 tablet Sublingual BID   enoxaparin (LOVENOX) injection  40 mg Subcutaneous [id number]   folic acid  1 mg Oral Daily   gabapentin  300 mg Oral TID   pantoprazole  40 mg Oral Daily   QUEtiapine  300 mg Oral QHS   senna  2 tablet Oral QHS   thiamine  100 mg Oral Daily    Objective: Vital signs in last 24 hours: Temp:  [97.3 F (36.3 C)-98.1 F (36.7 C)] 97.6 F (36.4 C) (03/01 1232) Pulse Rate:  [88-93] 88 (03/01 1232) Resp:  [16-18] 16 (03/01 1232) BP: (120-150)/(76-90) 122/87 (03/01 1232) SpO2:  [96 %-98 %] 98 % (03/01 1232) Weight:  [66.9 kg-69.8 kg] 69.8 kg (03/01 0441)  Physical Exam  Constitutional: He is oriented to person, place, and time. He appears well-developed and well-nourished. No distress.  HENT:  Mouth/Throat: Oropharynx is clear and moist. No oropharyngeal exudate.  Cardiovascular: Normal rate, regular rhythm and normal heart sounds. Exam reveals no gallop and no friction rub.  No murmur heard.  Pulmonary/Chest: Effort normal and breath sounds normal. No respiratory distress. He has no wheezes.  Abdominal: Soft. Bowel sounds are normal. He exhibits no distension. There is no tenderness.  Lymphadenopathy:  He has no cervical adenopathy.  Neurological: He is alert  and oriented to person, place, and time.  Skin: Skin is warm and dry. No rash noted. No erythema.  Psychiatric: He has a normal mood and affect. His behavior is normal.    Lab Results Recent Labs    01/31/23 0303  WBC 5.0  HGB 12.2*  HCT 38.1*  NA 136  K 3.6  CL 102  CO2 26  BUN 10  CREATININE 0.55*   Liver Panel No results for input(s): "PROT", "ALBUMIN", "AST", "ALT", "ALKPHOS", "BILITOT", "BILIDIR", "IBILI" in the last 72 hours. Sedimentation Rate No results for input(s): "ESRSEDRATE" in the last 72 hours. C-Reactive Protein No results for input(s): "CRP" in the last 72 hours.  Microbiology: Blood cx ngtd Studies/Results: CT HAND LEFT W CONTRAST  Result Date: 02/01/2023 CLINICAL DATA:  Soft tissue infection suspected, hand, xray done EXAM: CT OF THE UPPER LEFT EXTREMITY WITH CONTRAST TECHNIQUE: Multidetector CT imaging of the upper left extremity was performed according to the standard protocol following intravenous contrast administration. RADIATION DOSE REDUCTION: This exam was performed according to the departmental dose-optimization program which includes automated exposure control, adjustment of the mA and/or kV according to patient size and/or use of iterative reconstruction technique. CONTRAST:  61m OMNIPAQUE IOHEXOL 300 MG/ML  SOLN COMPARISON:  Left hand radiograph 01/28/2023, 01/13/2023, 07/13/2022, 11/14/2021 FINDINGS: Bones/Joint/Cartilage There is no evidence of acute fracture. There is articular surface irregularity/erosive changes of the intercarpal joints with bony fusion in the distal carpal row and bony fusion with the  scaphoid. There is also bony fusion of the second and third CMC joints. 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 This was noted on prior radiograph in August 2023 and new in comparison to December 2022. Old healed distal fifth metacarpal fracture. Ligaments Suboptimally assessed by CT.  Muscles and Tendons There is no acute myotendinous abnormality by CT. There is perching of the ECU tendon at the ulnar edge of the groove. Soft tissues There is diffuse soft tissue swelling of the hand with focal skin irregularity overlying the dorsal aspect of the radial hand near the base of thumb. There is no well-defined/organized fluid collection by CT. There is skin thickening of the hand. There is no soft tissue gas. IMPRESSION: Diffuse soft tissue swelling of the hand, as can be seen in cellulitis. Focal skin irregularity overlying the dorsal aspect of the radial hand near the base of thumb. No well-defined/organized fluid collection by CT. No soft tissue gas. Extensive erosive changes of the intercarpal joints with bony fusion in the distal carpal row, second and third CMC joints, and the scaphoid. These findings could be chronic posttraumatic change or sequela of prior septic arthritis. Ongoing joint infection is a possibility, though osseous changes are similar to prior radiographs. Electronically Signed   By: Maurine Simmering M.D.   On: 02/01/2023 16:41   CT HAND RIGHT W CONTRAST  Result Date: 02/01/2023 CLINICAL DATA:  Soft tissue infection suspected, hand, xray done per ID recommendation rule out right hand dorsal abscess EXAM: CT OF THE UPPER RIGHT EXTREMITY WITH CONTRAST TECHNIQUE: Multidetector CT imaging of the upper right extremity was performed according to the standard protocol following intravenous contrast administration. RADIATION DOSE REDUCTION: This exam was performed according to the departmental dose-optimization program which includes automated exposure control, adjustment of the mA and/or kV according to patient size and/or use of iterative reconstruction technique. CONTRAST:  75m OMNIPAQUE IOHEXOL 300 MG/ML  SOLN COMPARISON:  Radiograph 01/12/2022, CT 11/14/2021, hand radiograph 09/27/2021 FINDINGS: Bones/Joint/Cartilage There is no evidence of acute fracture. No focal bone lesion.  There is no bone erosion or periostitis. No frank bony destruction. Ligaments Suboptimally assessed by CT. Muscles and Tendons No acute myotendinous abnormality by CT. No evidence of intramuscular collection. Soft tissues There is skin thickening soft tissue swelling of the hand most prominent dorsally. There is no evidence of a well-defined/drainable fluid collection. IMPRESSION: Skin thickening and soft tissue swelling of the right hand, most prominent dorsally, as can be seen in cellulitis. No evidence of soft tissue abscess by CT. Electronically Signed   By: JMaurine SimmeringM.D.   On: 02/01/2023 13:52     Assessment/Plan: Cellulitis and deep tissue infection - no surgical debrdiement needed = continue on cefazolin 2gm iv q 8hr. Should he discharge over the weekend, can give a dose of ortivancin iv (to last in the system for 10 days) vs. Cefadroxil '1000mg'$  bid plus doxy '100mg'$  bid  [place] Pager: [phone]  02/02/2023, 3:30 PM

## 2023-02-02 NOTE — Consult Note (Signed)
Jason Buchanan Consult   Reason for Consult: Patient ''patient request to talk to a psychiatrist for depression and anxiety, reports h/o bipolar, used to family services in [place], [place] [facility] but has not been for a year. "  Referring Physician:  Dr. Erlinda Hong Patient Identification: Jason Buchanan MRN:  [id number] Principal Diagnosis: Amphetamine and psychostimulant-induced mood disorder (Jason Buchanan) Diagnosis:  Principal Problem:   Amphetamine and psychostimulant-induced mood disorder (Jason Buchanan) Active Problems:   Polysubstance abuse (Jason Buchanan)   IVDU (intravenous drug user)   Acute psychosis (Jason Buchanan)   Total Time spent with patient: 1 hour  Subjective:   Jason Buchanan is a 37 y.o. male patient admitted with bilateral arm pain from intravenous drug abuse. He has a history of substance induced mood disorder, anxiety, IVDU, Patient reports ongoing drug use since the age of 21. He currently uses multiple IV substances to include methamphetamine, opiates (UDS negative) and THC. He reports only periods of sobriety during incarceration. Longest sobriety has been 6-68yr (about 656yrago). He is currently homeless, non compliant with medications, feels hopeless at times, and has very limited support system. His lack of support is due to ongoing drug use in which he has burned bridges and sabotages other relationships.  Patient denies withdrawal symptoms at this time but endorse intermittent episodes of memory loss and confusion. He states that he currently feels ok while on Seroquel and Gabapentin, wishes to continue at this time. Today, he denies psychosis, delusions, suicidal ideation, homicidal ideations, and or self harming thoughts. Of note patient has had 10 ER visits and 3 [facility] admissions 2/t IVDU and complications. He plans on going to [facility] will follow-up at [facility], [facility] medication management. Patient symptoms are being managed well with current standards of care. Patient is high risk to  relapse as proven by multiple attempts to remain sober after [facility] admission, in which he continues to use. Patient will need door to door transfer.   No overt depressive symptoms, mania or psychosis witnessed at present. Denies any SI, HI or AVH at present. No prior history of self harm or SA and no prior inpatient admission for mental health issues. He is homeless at present with minimal social support. Given all this, patient will benefit from inpatient rehabilitation and outpatient therapy.   HPI:  [age]ear old male with a past medical history significant for narcotic abuse, schizophrenia, IV drug abuse and recent left hand abscess and tenosynovitis requiring debridement by Dr. OrApolonio Schneidersn January 13, 2023 [facility] of pain in his left hand. The patient had recently been discharged from this facility on February 13 after admission, surgical management of left hand abscess and tenosynovitis. He was discharged on muscle relaxants, narcotics for pain medicine. He returned to the emergency department complaining of hand pain and stated that he had recently been injecting IV fentanyl and heroin.    Past Psychiatric History: MDD, Substance induced mood disorder. Trialed Seroquel and Gabapentin.   Risk to Self:  denies Risk to Others:  denies Prior Inpatient Therapy:    [facility] Prior Outpatient Therapy:    Past Medical History:  Past Medical History:  Diagnosis Date   Asthma    Mood disorder (Jason Buchanan   Polysubstance abuse (Jason Buchanan   Schizophrenia (Jason Buchanan    Past Surgical History:  Procedure Laterality Date   ANKLE SURGERY     APPLICATION OF WOUND VAC Right 11/22/2021  Procedure: APPLICATION OF WOUND VAC;  Surgeon: Dayna Barker, MD;  [facility];  Service: Plastics;  Laterality: Right;   APPLICATION OF WOUND VAC Right 11/25/2021   Procedure: APPLICATION OF WOUND VAC;   Surgeon: Dayna Barker, MD;  [facility];  Service: Plastics;  Laterality: Right;   APPLICATION OF WOUND VAC Right 12/07/2021   Procedure: APPLICATION OF WOUND VAC;  Surgeon: Dayna Barker, MD;  [facility];  Service: Plastics;  Laterality: Right;   APPLICATION OF WOUND VAC Right 12/14/2021   Procedure: APPLICATION OF WOUND VAC;  Surgeon: [name], MD;  [facility];  Service: Plastics;  Laterality: Right;   APPLICATION OF WOUND VAC Right 12/22/2021   Procedure: WOUND VAC CHANGE;  Surgeon: [name], MD;  [facility];  Service: Plastics;  Laterality: Right;   APPLICATION OF WOUND VAC Right 12/29/2021   Procedure: APPLICATION OF WOUND VAC;  Surgeon: [name], MD;  [facility];  Service: Plastics;  Laterality: Right;   APPLICATION OF WOUND VAC Right 01/03/2022   Procedure: WOUND VAC APPLICATION;  Surgeon: [name], MD;  [facility];  Service: Plastics;  Laterality: Right;   BIOPSY  11/17/2021   Procedure: BIOPSY;  Surgeon: Clarene Essex, MD;  [facility];  Service: Endoscopy;;   BUBBLE STUDY  09/30/2021   Procedure: BUBBLE STUDY;  Surgeon: Dixie Dials, MD;  Location: [place];  Service: Cardiovascular;;   DEBRIDEMENT AND CLOSURE WOUND Right 01/03/2022   Procedure: DEBRIDEMENT AND COMPLEX CLOSURE OF RIGHT ARM WOUND;  Surgeon: [name], MD;  [facility];  Service: Plastics;  Laterality: Right;   ESOPHAGOGASTRODUODENOSCOPY (EGD) WITH PROPOFOL N/A 11/17/2021   Procedure: ESOPHAGOGASTRODUODENOSCOPY (EGD) WITH PROPOFOL;  Surgeon: Clarene Essex, MD;  [facility];  Service: Endoscopy;  Laterality: N/A;   FRACTURE SURGERY     I & D EXTREMITY Right 09/27/2021   Procedure: IRRIGATION AND DEBRIDEMENT OF ABSCESS RIGHT HAND;  Surgeon: Dayna Barker, MD;  Location: [place];  Service: Plastics;  Laterality: Right;   I & D EXTREMITY Bilateral 11/15/2021   Procedure: IRRIGATION AND DEBRIDEMENT EXTREMITY;  Surgeon: Dayna Barker, MD;  [facility];  Service: Plastics;  Laterality: Bilateral;   I & D EXTREMITY Right 11/17/2021   Procedure: IRRIGATION AND DEBRIDEMENT EXTREMITY;  Surgeon: Dayna Barker, MD;  [facility];  Service: Plastics;  Laterality: Right;   I & D EXTREMITY Bilateral 12/01/2021   Procedure: INCISION AND DRAINAGE BILATERAL UPPER EXTREMITIES WITH  WOUND VAC CHANGE RIGHT;  Surgeon: Orene Desanctis, MD;  [facility];  Service: Orthopedics;  Laterality: Bilateral;   I & D EXTREMITY Right 12/07/2021   Procedure: IRRIGATION AND DEBRIDEMENT EXTREMITY, MANIPULATION OF FINGERS UNDER ANESTHESIA;  Surgeon: Dayna Barker, MD;  [facility];  Service: Plastics;  Laterality: Right;   I & D EXTREMITY Left 01/13/2023   Procedure: IRRIGATION AND DEBRIDEMENT EXTREMITY;  Surgeon: Iran Planas, MD;  [facility];  Service: Orthopedics;  Laterality: Left;   INCISION AND DRAINAGE OF WOUND Left 09/27/2021   Procedure: IRRIGATION AND DEBRIDEMENT OF ABSCESS LEFT Jason Buchanan FINGER;  Surgeon: Dayna Barker, MD;  Location: [place];  Service: Plastics;  Laterality: Left;   INCISION AND DRAINAGE OF WOUND Bilateral 11/22/2021   Procedure: IRRIGATION AND DEBRIDEMENT WOUND;  Surgeon: Dayna Barker, MD;  [facility];  Service: Plastics;  Laterality: Bilateral;   INCISION AND DRAINAGE OF WOUND Bilateral 11/25/2021   Procedure: BILATERAL IRRIGATION AND DEBRIDEMENT BILATERAL ARMS;  Surgeon: Dayna Barker, MD;  [facility];  Service: Plastics;  Laterality: Bilateral;   INCISION AND DRAINAGE OF WOUND Right 12/14/2021   Procedure: IRRIGATION AND DEBRIDEMENT RIGHT ARM;  Surgeon: [name], MD;  [facility];  Service: Plastics;  Laterality: Right;   INCISION AND DRAINAGE OF WOUND Right 12/22/2021   Procedure: IRRIGATION AND DEBRIDEMENT ARM  WOUND;  Surgeon: [name], MD;  [facility];  Service: Plastics;  Laterality: Right;  1 hour   INCISION AND DRAINAGE OF WOUND Right 12/29/2021    Procedure: DEBRIDEMENT RIGHT ARM  WOUND;  Surgeon: [name], MD;  [facility];  Service: Plastics;  Laterality: Right;   JOINT REPLACEMENT     SCLEROTHERAPY  11/17/2021   Procedure: Clide Deutscher;  Surgeon: Clarene Essex, MD;  [facility];  Service: Endoscopy;;   SKIN SPLIT GRAFT Right 01/03/2022   Procedure: SKIN GRAFT SPLIT THICKNESS;  Surgeon: [name], MD;  [facility];  Service: Plastics;  Laterality: Right;   TEE WITHOUT CARDIOVERSION N/A 09/30/2021   Procedure: TRANSESOPHAGEAL ECHOCARDIOGRAM (TEE);  Surgeon: Dixie Dials, MD;  Location[facility];  Service: Cardiovascular;  Laterality: N/A;   Family History: No family history on file. Family Psychiatric  History:   Social History:  Social History   Substance and Sexual Activity  Alcohol Use Not Currently   Comment: occasionally     Social History   Substance and Sexual Activity  Drug Use Yes   Types: IV, Marijuana   Comment: heroin, meth    Social History   Socioeconomic History   Marital status: Single    Spouse name: Not on file   Number of children: Not on file   Years of education: Not on file   Highest education level: Not on file  Occupational History   Not on file  Tobacco Use   Smoking status: Some Days    Packs/day: 0.50    Types: Cigarettes   Smokeless tobacco: Never  Substance and Sexual Activity   Alcohol use: Not Currently    Comment: occasionally   Drug use: Yes    Types: IV, Marijuana    Comment: heroin, meth   Sexual activity: Not on file  Other Topics Concern   Not on file  Social History Narrative   ** Merged History Encounter **       Social Determinants of Health   Financial Resource Strain: Not on file  Food Insecurity: No Food Insecurity (01/13/2023)   Hunger Vital Sign    Worried About Running Out of Food in the Last Year: Never true    Ran Out of Food in the Last Year: Never true  Transportation Needs: Unmet Transportation Needs (01/13/2023)    PRAPARE - Hydrologist (Medical): Yes    Lack of Transportation (Non-Medical): Yes  Physical Activity: Not on file  Stress: Not on file  Social Connections: Not on file   Additional Social History:    Allergies:   Allergies  Allergen Reactions   Bee Venom Anaphylaxis, Swelling and Other (See Comments)    Swelling all over   Acetaminophen Rash and Other (See Comments)    Elevated liver enzymes, also   Succinylcholine Other (See Comments)    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 Had prolonged block (>30 min) after succinylcholine for GI procedure.    Chlorhexidine Other (See Comments)    Pt does not remember reaction    Naproxen Other (See Comments)    Stomach upset    Labs:  No results found  for this or any previous visit (from the past 48 hour(s)).   Current Facility-Administered Medications  Medication Dose Route Frequency Provider Last Rate Last Admin   acetaminophen (TYLENOL) tablet 650 mg  650 mg Oral Q4H PRN Simonne Maffucci B, MD   650 mg at 02/01/23 1431   buprenorphine-naloxone (SUBOXONE) 8-2 mg per SL tablet 1 tablet  1 tablet Sublingual BID Erick Colace, NP   1 tablet at 02/02/23 1008   ceFAZolin (ANCEF) IVPB 2g/100 mL premix  2 g Intravenous Q8H Carlyle Basques, MD 200 mL/hr at 02/02/23 0506 2 g at 02/02/23 0506   docusate sodium (COLACE) capsule 100 mg  100 mg Oral BID PRN Juanito Doom, MD       enoxaparin (LOVENOX) injection 40 mg  40 mg Subcutaneous Q24H Simonne Maffucci B, MD   40 mg at [id number]   folic acid (FOLVITE) tablet 1 mg  1 mg Oral Daily Chesley Mires, MD   1 mg at 02/02/23 1008   gabapentin (NEURONTIN) capsule 300 mg  300 mg Oral TID Hosie Poisson, MD   300 mg at 02/02/23 1008   haloperidol lactate (HALDOL) injection 1-4 mg  1-4 mg Intravenous Q3H PRN Simonne Maffucci B, MD   1 mg at 02/02/23 1025   lactated ringers infusion   Intravenous Continuous Simonne Maffucci B, MD 100 mL/hr at 02/01/23 2234 New  Bag at 02/01/23 2234   melatonin tablet 3 mg  3 mg Oral QHS PRN Anders Simmonds, MD   3 mg at 02/01/23 2228   ondansetron [facility]) injection 4 mg  4 mg Intravenous Q6H PRN Simonne Maffucci B, MD   4 mg at 01/30/23 0306   pantoprazole (PROTONIX) EC tablet 40 mg  40 mg Oral Daily Simonne Maffucci B, MD   40 mg at 02/02/23 1009   polyethylene glycol (MIRALAX / GLYCOLAX) packet 17 g  17 g Oral Daily PRN Juanito Doom, MD       potassium chloride SA (KLOR-CON M) CR tablet 40 mEq  40 mEq Oral Once Florencia Reasons, MD       QUEtiapine (SEROQUEL XR) 24 hr tablet 300 mg  300 mg Oral QHS Simonne Maffucci B, MD   300 mg at 02/01/23 2228   senna (SENOKOT) tablet 17.2 mg  2 tablet Oral QHS Simonne Maffucci B, MD   17.2 mg at 02/01/23 2228   thiamine (VITAMIN B1) tablet 100 mg  100 mg Oral Daily Chesley Mires, MD   100 mg at 02/02/23 1009    Musculoskeletal: Strength & Muscle Tone: within normal limits Gait & Station: normal Patient leans: N/A    Psychiatric Specialty Exam:  Presentation  General Appearance: No data recorded  Eye Contact:No data recorded  Speech:No data recorded  Speech Volume:No data recorded  Handedness:No data recorded   Mood and Affect  Mood:No data recorded  Affect:No data recorded   Thought Process  Thought Processes:No data recorded  Descriptions of Associations:No data recorded  Orientation:No data recorded  Thought Content:No data recorded  History of Schizophrenia/Schizoaffective disorder:No data recorded Duration of Psychotic Symptoms:No data recorded Hallucinations:No data recorded  Ideas of Reference:No data recorded  Suicidal Thoughts:No data recorded  Homicidal Thoughts:No data recorded   Sensorium  Memory:No data recorded  Judgment:No data recorded  Insight:No data recorded   Executive Functions  Concentration:No data recorded  Attention Span:No data recorded  Recall:No data recorded  Fund of Knowledge:No data  recorded  Language:No data recorded   Psychomotor Activity  Psychomotor  Activity:No data recorded   Assets  Assets:No data recorded   Sleep  Sleep:No data recorded   Physical Exam: Physical Exam Vitals and nursing note reviewed.  Constitutional:      Appearance: Normal appearance. He is normal weight.  Skin:    Capillary Refill: Capillary refill takes less than 2 seconds.  Neurological:     General: No focal deficit present.     Mental Status: He is alert and oriented to person, place, and time. Mental status is at baseline.  Psychiatric:        Behavior: Behavior normal.    Review of Systems  Psychiatric/Behavioral:  Positive for memory loss (describes periods of dissoaciation) and substance abuse.   All other systems reviewed and are negative.  Blood pressure 120/76, pulse 93, temperature (!) 97.3 F (36.3 C), temperature source Oral, resp. rate 17, height '5\' 6"'$  (1.676 m), weight 69.8 kg, SpO2 96 %. Body mass Jason Buchanan is 24.84 kg/m.  Treatment Plan Summary: 38 year old male with long history of mental illness, polysubstance dependence who was admitted with arm pain/cellulitis secondary to IV drug use. Patient is currently endorsing mood swings but denies psychosis, delusions and self harming thought.  Recommendations: -Continue Seroquel XR 300 mg qhs at bedtime for mood stabilization -Consider social worker consult for psycho social assessment and referral for outpatient drug rehabilitation after patient is medically cleared. -Patient will benefit from Door to Door Transfer at this time. He has been in communication with [facility]. Patient will likely relapse if discharged, encourage timely communication and admission intake completion asap.   Disposition: No evidence of imminent risk to self or others at present.   Supportive therapy provided about ongoing stressors. Psychiatric consultation service signing out. Re-consult as needed  Suella Broad,  FNP 02/02/2023 11:43 AM

## 2023-02-03 DIAGNOSIS — F1594 Other stimulant use, unspecified with stimulant-induced mood disorder: Secondary | ICD-10-CM

## 2023-02-03 MED ORDER — HYDROXYZINE HCL 10 MG PO TABS
10.0000 mg | ORAL_TABLET | Freq: Four times a day (QID) | ORAL | Status: DC | PRN
  Administered 2023-02-03 – 2023-02-06 (×9): 10 mg via ORAL
  Filled 2023-02-03 (×9): qty 1

## 2023-02-03 NOTE — TOC Progression Note (Signed)
Transition of Care [facility]) - Progression Note    Patient Details  Name: Jason Buchanan MRN: [id number] Date of Birth: 1986-03-03  [facility] CM/SW Contact  Henrietta Dine, RN Phone Number: 02/03/2023, 2:27 PM  Clinical Narrative:    Notified pt ready for d/c to [facility]; spoke w/ Kennyth Lose, Residential worker at agency, and she says agency does not do intake on weekends; she says their intake is Monday -  Thursday; Kennyth Lose says if pt wants to be admitted he must be on site at agency by 0730 Monday morning; she says pt will need to bring proof of [facility] residency, photo ID or social security card, and 30 day supply of all meds plus refills; Dr Miachel Roux notified; she says pt must have door to door serviced per psych due to risk of relapse; contacted [facility] Supervisor Nancy Marus and she say pt will need to be d/c'd early enough for cab to take him to agencyl she also says a taxi voucher can be provided and the bedside RN can call for pickup; Dr Erlinda Hong notified.   Expected Discharge Plan: Homeless Shelter Barriers to Discharge: Continued Medical Work up  Expected Discharge Plan and Services In-house Referral: NA Discharge Planning Services: CM Consult Post Acute Care Choice: NA Living arrangements for the past 2 months: Homeless                 DME Arranged: N/A DME Agency: NA       HH Arranged: NA HH Agency: NA         Social Determinants of Health (SDOH) Interventions SDOH Screenings   Food Insecurity: No Food Insecurity (01/13/2023)  Housing: High Risk (01/13/2023)  Transportation Needs: Unmet Transportation Needs (01/13/2023)  Utilities: Not At Risk (01/15/2023)  Recent Concern: Utilities - At Risk (01/13/2023)  Tobacco Use: High Risk (01/14/2023)    Readmission Risk Interventions    01/30/2023    1:24 PM 01/15/2023   12:02 PM  Readmission Risk Prevention Plan  Transportation Screening Complete   PCP or Specialist Appt within 3-5 Days    HRI or [place] Work Consult for [place] Planning/Counseling    [place] Screening    Medication Review Press photographer) Complete   PCP or Specialist appointment within 3-5 days of discharge Complete   HRI or [place] Complete   SW Recovery Care/Counseling Consult Complete   [place] Not Applicable      Information is confidential and restricted. Go to Review Flowsheets to unlock data.

## 2023-02-03 NOTE — Progress Notes (Signed)
[facility]                                                                               Jason Buchanan, is a 37 y.o. male, DOB - 06-19-1986, [id number] Admit date - 01/28/2023    Outpatient Primary MD for the patient is [name], [name], NP  LOS - 6  days    Brief summary   37 year old male with a past medical history significant for narcotic abuse, schizophrenia, IV drug abuse and recent left hand abscess and tenosynovitis requiring debridement by Dr. Apolonio Schneiders on January 13, 2023 presented  to the [facility] emergency department complaining of pain in his left hand. The patient had recently been discharged from this facility on February 13 after admission, surgical management of left hand abscess and tenosynovitis. He was discharged on muscle relaxants, narcotics for pain medicine. He returned to the emergency department complaining of hand pain and stated that he had recently been injecting IV fentanyl and heroin.   He was admitted by [facility] and was on precedex, off precedex   Assessment & Plan     Left hand cellulitis, recent tenosynovitis and abscess: ( reports heroin use) S/p I/d during last hospitalization On 2/10  Seen by ortho Dr Caralyn Guile on 2/26 who recommended abx treatment, no need of surgical  intervention.   blood cultures no growth Wbc count wnl.  Has been on vanc and cefepime, seen by ID who recommended ct bilateral hands on  2/29 no abscess ,abx changed to ancef,  will  ID follow recommendation   Hypokalemia /hypomagnesemia Continue to Replace, improved, continue monitor  Mild hyponatremia, resolved  Mild normocytic anemia:  Hemoglobin stable 11-12    H/o acute psychosis in the setting schizophrenia, Non compliance to meds.  H/o narcotic (heroin) and methamphetamine abuse:  Initially required ic admission on Precedex drip  due to severe agitation Weaned off precedex , out of icu on 2/29 Seen by psych ,Continue with Seroquel and haldol.      H/o polysubstance abuse;  UDS is positive for tetrahydrocannabinol and amphetamines, heroin won't show on UDS, reports last use on day of admission He desires to talk to psychiatry, he desires to go to detox facility Seen by psych who recommended "Patient will benefit from Door to Door Transfer at this time. He has been in communication with [facility]. Patient will likely relapse if discharged, encourage timely communication and admission intake completion asap.  " TOC made aware    Chronic pain  He was started on suboxone by PCCM this admission.  Detox facility/chronic pain clinic? Psychiatry consulted     Estimated body mass index is 25.05 kg/m as calculated from the following:   Height as of this encounter: '5\' 6"'$  (1.676 m).   Weight as of this encounter: 70.4 kg.  Code Status: full code.  DVT Prophylaxis:  enoxaparin (LOVENOX) injection 40 mg Start: 01/28/23 2000   Family Communication: none at bedside.   Disposition Plan:     medically stable to discharge, Patient will benefit from Door to Door Transfer at this time. He has been in communication with [facility]. Patient will likely relapse if discharged, encourage timely communication and  admission intake completion asap.    Procedures:  None.   Consultants:   PCCM Dr Caralyn Guile with Orthopedics.  ID Psychiatry  TOC  Antimicrobials:   Anti-infectives (From admission, onward)    Start     Dose/Rate Route Frequency Ordered Stop   01/31/23 2200  ceFAZolin (ANCEF) IVPB 2g/100 mL premix        2 g 200 mL/hr over 30 Minutes Intravenous Every 8 hours 01/31/23 1418     01/29/23 2200  vancomycin (VANCOREADY) IVPB 1250 mg/250 mL  Status:  Discontinued        1,250 mg 166.7 mL/hr over 90 Minutes Intravenous Every 12 hours 01/29/23 1000 01/31/23 1417   01/29/23 1045  vancomycin (VANCOREADY) IVPB 1500 mg/300 mL        1,500 mg 150 mL/hr over 120 Minutes Intravenous  Once 01/29/23 1000 01/29/23 1431   01/28/23 2200  ceFEPIme  (MAXIPIME) 2 g in sodium chloride 0.9 % 100 mL IVPB  Status:  Discontinued        2 g 200 mL/hr over 30 Minutes Intravenous Every 8 hours 01/28/23 1519 01/31/23 1417   01/28/23 1400  vancomycin (VANCOREADY) IVPB 1250 mg/250 mL        1,250 mg 166.7 mL/hr over 90 Minutes Intravenous  Once 01/28/23 1358 01/28/23 1651   01/28/23 1400  ceFEPIme (MAXIPIME) 2 g in sodium chloride 0.9 % 100 mL IVPB        2 g 200 mL/hr over 30 Minutes Intravenous  Once 01/28/23 1358 01/28/23 1516        Medications  Scheduled Meds:  buprenorphine-naloxone  1 tablet Sublingual BID   enoxaparin (LOVENOX) injection  40 mg Subcutaneous [id number]   folic acid  1 mg Oral Daily   gabapentin  300 mg Oral TID   pantoprazole  40 mg Oral Daily   QUEtiapine  300 mg Oral QHS   senna  2 tablet Oral QHS   thiamine  100 mg Oral Daily   Continuous Infusions:   ceFAZolin (ANCEF) IV 2 g (02/03/23 1437)   PRN Meds:.acetaminophen, docusate sodium, haloperidol lactate, hydrOXYzine, melatonin, ondansetron (ZOFRAN) IV, polyethylene glycol    Subjective:   Eyosias Anstett was seen and examined today.   No acute event   Denies SI or HI   Objective:   Vitals:   02/02/23 2013 02/03/23 0418 02/03/23 0427 02/03/23 1339  BP: 131/76  121/76 (!) 140/80  Pulse: 90  98 88  Resp: '18  16 18  '$ Temp: 97.9 F (36.6 C)  97.9 F (36.6 C) 98.1 F (36.7 C)  TempSrc: Oral  Oral Oral  SpO2: 99%  97% 98%  Weight:  70.4 kg    Height:        Intake/Output Summary (Last 24 hours) at 02/03/2023 1746 Last data filed at 02/03/2023 1355 Gross per 24 hour  Intake 4851.55 ml  Output --  Net 4851.55 ml   Filed Weights   02/01/23 1625 02/02/23 0441 02/03/23 0418  Weight: 66.9 kg 69.8 kg 70.4 kg     Exam General exam: Appears calm and comfortable  Respiratory system: Clear to auscultation. Respiratory effort normal. Cardiovascular system: S1 & S2 heard, RRR. No JVD, murmurs,  Gastrointestinal system: Abdomen is nondistended, soft and  nontender.  Central nervous system: Alert and oriented. No focal neurological deficits. Extremities: left hand wound is improving, right dorsal hand less swollen Skin: No rashes, Psychiatry: Mood & affect appropriate.    Data Reviewed:  I have personally reviewed following labs  and imaging studies   CBC Lab Results  Component Value Date   WBC 5.0 01/31/2023   RBC 4.23 01/31/2023   HGB 12.2 (L) 01/31/2023   HCT 38.1 (L) 01/31/2023   MCV 90.1 01/31/2023   MCH 28.8 01/31/2023   PLT 310 01/31/2023   MCHC 32.0 01/31/2023   RDW 12.3 01/31/2023   LYMPHSABS 1.6 01/31/2023   MONOABS 0.6 01/31/2023   EOSABS 0.3 01/31/2023   BASOSABS 0.1 [id number]     Last metabolic panel Lab Results  Component Value Date   NA 136 01/31/2023   K 3.6 01/31/2023   CL 102 01/31/2023   CO2 26 01/31/2023   BUN 10 01/31/2023   CREATININE 0.55 (L) 01/31/2023   GLUCOSE 117 (H) 01/31/2023   GFRNONAA >60 01/31/2023   GFRAA >60 11/09/2017   CALCIUM 8.4 (L) 01/31/2023   PHOS 4.0 01/31/2023   PROT 7.0 01/28/2023   ALBUMIN 4.0 01/28/2023   BILITOT 1.3 (H) 01/28/2023   ALKPHOS 110 01/28/2023   AST 41 01/28/2023   ALT 38 01/28/2023   ANIONGAP 8 01/31/2023    CBG (last 3)  No results for input(s): "GLUCAP" in the last 72 hours.     Coagulation Profile: No results for input(s): "INR", "PROTIME" in the last 168 hours.   Radiology Studies: No results found.     Florencia Reasons MD PhD FACP [facility] 02/03/2023, 5:46 PM  Available via Epic secure chat 7am-7pm After 7 pm, please refer to night coverage provider listed on amion.

## 2023-02-04 LAB — CBC WITH DIFFERENTIAL/PLATELET
Abs Immature Granulocytes: 0.03 10*3/uL (ref 0.00–0.07)
Basophils Absolute: 0.1 10*3/uL (ref 0.0–0.1)
Basophils Relative: 1 %
Eosinophils Absolute: 0.6 10*3/uL — ABNORMAL HIGH (ref 0.0–0.5)
Eosinophils Relative: 9 %
HCT: 43.8 % (ref 39.0–52.0)
Hemoglobin: 14.1 g/dL (ref 13.0–17.0)
Immature Granulocytes: 1 %
Lymphocytes Relative: 41 %
Lymphs Abs: 2.6 10*3/uL (ref 0.7–4.0)
MCH: 29 pg (ref 26.0–34.0)
MCHC: 32.2 g/dL (ref 30.0–36.0)
MCV: 90.1 fL (ref 80.0–100.0)
Monocytes Absolute: 0.8 10*3/uL (ref 0.1–1.0)
Monocytes Relative: 13 %
Neutro Abs: 2.2 10*3/uL (ref 1.7–7.7)
Neutrophils Relative %: 35 %
Platelets: 272 10*3/uL (ref 150–400)
RBC: 4.86 MIL/uL (ref 4.22–5.81)
RDW: 12.3 % (ref 11.5–15.5)
WBC: 6.3 10*3/uL (ref 4.0–10.5)
nRBC: 0 % (ref 0.0–0.2)

## 2023-02-04 LAB — BASIC METABOLIC PANEL
Anion gap: 9 (ref 5–15)
BUN: 18 mg/dL (ref 6–20)
CO2: 25 mmol/L (ref 22–32)
Calcium: 9 mg/dL (ref 8.9–10.3)
Chloride: 100 mmol/L (ref 98–111)
Creatinine, Ser: 0.57 mg/dL — ABNORMAL LOW (ref 0.61–1.24)
GFR, Estimated: >60 mL/min (ref >60)
Glucose, Bld: 111 mg/dL — ABNORMAL HIGH (ref 70–99)
Potassium: 4.2 mmol/L (ref 3.5–5.1)
Sodium: 134 mmol/L — ABNORMAL LOW (ref 135–145)

## 2023-02-04 LAB — MAGNESIUM: Magnesium: 1.9 mg/dL (ref 1.7–2.4)

## 2023-02-04 LAB — TSH: TSH: 4.836 u[IU]/mL — ABNORMAL HIGH (ref 0.350–4.500)

## 2023-02-04 NOTE — Progress Notes (Signed)
[facility]                                                                               Travis Saler, is a 37 y.o. male, DOB - Dec 20, 1985, [id number] Admit date - 01/28/2023    Outpatient Primary MD for the patient is [name], [name], NP  LOS - 7  days    Brief summary   37 year old male with a past medical history significant for narcotic abuse, schizophrenia, IV drug abuse and recent left hand abscess and tenosynovitis requiring debridement by Dr. Apolonio Schneiders on January 13, 2023 presented  to the [facility] emergency department complaining of pain in his left hand. The patient had recently been discharged from this facility on February 13 after admission, surgical management of left hand abscess and tenosynovitis. He was discharged on muscle relaxants, narcotics for pain medicine. He returned to the emergency department complaining of hand pain and stated that he had recently been injecting IV fentanyl and heroin.   He was admitted by [facility] and was on precedex, off precedex   Assessment & Plan     Left hand cellulitis, recent tenosynovitis and abscess: ( reports heroin use) S/p I/d during last hospitalization On 2/10  Seen by ortho Dr Caralyn Guile on 2/26 who recommended abx treatment, no need of surgical  intervention.   blood cultures no growth Wbc count wnl.  Repeat  ct bilateral hands on  2/29 no abscess  Has been on vanc and cefepime,now on ancef,  @@ patient is to leave hospital on 3/5 at 6:30am to reports to [facility] at 7:30 am on 3/5  , please discuss with  ID on 3/4 am for abx recommendation at discharge, needs discharge meds delivered to him on 3/4 from [facility]@@  Hypokalemia /hypomagnesemia Continue to Replace, improved, continue monitor  Mild hyponatremia, resolved  Mild normocytic anemia:  Hemoglobin stable 11-12    H/o acute psychosis in the setting schizophrenia, Non compliance to meds.  H/o narcotic (heroin) and  methamphetamine abuse:  Initially required ic admission on Precedex drip  due to severe agitation Weaned off precedex , out of icu on 2/29 Seen by psych ,Continue with Seroquel , prn haldol.  Has been doing well, has not need any prn haldol, he does need prn hydroxyzine for anxiety     H/o polysubstance abuse;  UDS is positive for tetrahydrocannabinol and amphetamines, heroin won't show on UDS, reports last use on day of admission He desires to talk to psychiatry, he desires to go to detox facility Seen by psych who recommended "Patient will benefit from Door to Door Transfer at this time. He has been in communication with [facility]. Patient will likely relapse if discharged, encourage timely communication and admission intake completion asap.  " TOC made aware    Chronic pain  He was started on suboxone by PCCM this admission.  Discharge to Detox facility Psychiatry input appreciated      Estimated body mass index is 24.62 kg/m as calculated from the following:   Height as of this encounter: '5\' 6"'$  (1.676 m).   Weight as of this encounter: 69.2 kg.  Code Status: full code.  DVT Prophylaxis:  enoxaparin (LOVENOX) injection 40 mg Start: 01/28/23 2000   Family Communication: none at bedside.   Disposition Plan:     medically stable to discharge, Patient will benefit from Door to Door Transfer at this time. He has been in communication with [facility]. Patient will likely relapse if discharged, encourage timely communication and admission intake completion asap.    Procedures:  None.   Consultants:   PCCM Dr Caralyn Guile with Orthopedics.  ID Psychiatry  TOC  Antimicrobials:   Anti-infectives (From admission, onward)    Start     Dose/Rate Route Frequency Ordered Stop   01/31/23 2200  ceFAZolin (ANCEF) IVPB 2g/100 mL premix        2 g 200 mL/hr over 30 Minutes Intravenous Every 8 hours 01/31/23 1418     01/29/23 2200  vancomycin (VANCOREADY) IVPB 1250 mg/250 mL  Status:   Discontinued        1,250 mg 166.7 mL/hr over 90 Minutes Intravenous Every 12 hours 01/29/23 1000 01/31/23 1417   01/29/23 1045  vancomycin (VANCOREADY) IVPB 1500 mg/300 mL        1,500 mg 150 mL/hr over 120 Minutes Intravenous  Once 01/29/23 1000 01/29/23 1431   01/28/23 2200  ceFEPIme (MAXIPIME) 2 g in sodium chloride 0.9 % 100 mL IVPB  Status:  Discontinued        2 g 200 mL/hr over 30 Minutes Intravenous Every 8 hours 01/28/23 1519 01/31/23 1417   01/28/23 1400  vancomycin (VANCOREADY) IVPB 1250 mg/250 mL        1,250 mg 166.7 mL/hr over 90 Minutes Intravenous  Once 01/28/23 1358 01/28/23 1651   01/28/23 1400  ceFEPIme (MAXIPIME) 2 g in sodium chloride 0.9 % 100 mL IVPB        2 g 200 mL/hr over 30 Minutes Intravenous  Once 01/28/23 1358 01/28/23 1516        Medications  Scheduled Meds:  buprenorphine-naloxone  1 tablet Sublingual BID   enoxaparin (LOVENOX) injection  40 mg Subcutaneous [id number]   folic acid  1 mg Oral Daily   gabapentin  300 mg Oral TID   pantoprazole  40 mg Oral Daily   QUEtiapine  300 mg Oral QHS   senna  2 tablet Oral QHS   thiamine  100 mg Oral Daily   Continuous Infusions:   ceFAZolin (ANCEF) IV 2 g (02/04/23 1527)   PRN Meds:.acetaminophen, docusate sodium, haloperidol lactate, hydrOXYzine, melatonin, ondansetron (ZOFRAN) IV, polyethylene glycol    Subjective:   Cale Mazzocchi was seen and examined today.   No acute event   Denies SI or HI He is motivated to "get clean" for his 5yrson, he is very appreciative for the care he is getting here.   Objective:   Vitals:   02/03/23 2009 02/04/23 0457 02/04/23 0500 02/04/23 1429  BP: 132/88 124/78  130/75  Pulse: 89 96  (!) 101  Resp: '16 18  18  '$ Temp: 98.6 F (37 C) 97.8 F (36.6 C)  97.9 F (36.6 C)  TempSrc: Oral Oral  Oral  SpO2: 99% 97%  98%  Weight:   69.2 kg   Height:        Intake/Output Summary (Last 24 hours) at 02/04/2023 1918 Last data filed at 02/04/2023 1851 Gross per 24 hour   Intake 708 ml  Output --  Net 708 ml   Filed Weights   02/02/23 0441 02/03/23 0418 02/04/23 0500  Weight: 69.8 kg 70.4 kg 69.2 kg  Exam General exam: Appears calm and comfortable  Respiratory system: Clear to auscultation. Respiratory effort normal. Cardiovascular system: S1 & S2 heard, RRR. No JVD, murmurs,  Gastrointestinal system: Abdomen is nondistended, soft and nontender.  Central nervous system: Alert and oriented. No focal neurological deficits. Extremities: left hand wound is improving, right dorsal hand less swollen Skin: No rashes, Psychiatry: Mood & affect appropriate.    Data Reviewed:  I have personally reviewed following labs and imaging studies   CBC Lab Results  Component Value Date   WBC 6.3 02/04/2023   RBC 4.86 02/04/2023   HGB 14.1 02/04/2023   HCT 43.8 02/04/2023   MCV 90.1 02/04/2023   MCH 29.0 02/04/2023   PLT 272 02/04/2023   MCHC 32.2 02/04/2023   RDW 12.3 02/04/2023   LYMPHSABS 2.6 02/04/2023   MONOABS 0.8 02/04/2023   EOSABS 0.6 (H) 02/04/2023   BASOSABS 0.1 [id number]     Last metabolic panel Lab Results  Component Value Date   NA 134 (L) 02/04/2023   K 4.2 02/04/2023   CL 100 02/04/2023   CO2 25 02/04/2023   BUN 18 02/04/2023   CREATININE 0.57 (L) 02/04/2023   GLUCOSE 111 (H) 02/04/2023   GFRNONAA >60 02/04/2023   GFRAA >60 11/09/2017   CALCIUM 9.0 02/04/2023   PHOS 4.0 01/31/2023   PROT 7.0 01/28/2023   ALBUMIN 4.0 01/28/2023   BILITOT 1.3 (H) 01/28/2023   ALKPHOS 110 01/28/2023   AST 41 01/28/2023   ALT 38 01/28/2023   ANIONGAP 9 02/04/2023    CBG (last 3)  No results for input(s): "GLUCAP" in the last 72 hours.     Coagulation Profile: No results for input(s): "INR", "PROTIME" in the last 168 hours.   Radiology Studies: No results found.     Florencia Reasons MD PhD FACP [facility] 02/04/2023, 7:18 PM  Available via Epic secure chat 7am-7pm After 7 pm, please refer to night coverage provider listed  on amion.

## 2023-02-05 ENCOUNTER — Other Ambulatory Visit (HOSPITAL_COMMUNITY): Payer: Self-pay

## 2023-02-05 MED ORDER — CEFADROXIL 500 MG PO CAPS
1000.0000 mg | ORAL_CAPSULE | Freq: Two times a day (BID) | ORAL | Status: DC
  Administered 2023-02-05 (×2): 1000 mg via ORAL
  Filled 2023-02-05 (×4): qty 2

## 2023-02-05 MED ORDER — FOLIC ACID 1 MG PO TABS
1.0000 mg | ORAL_TABLET | Freq: Every day | ORAL | 0 refills | Status: AC
  Filled 2023-02-05: qty 90, 90d supply, fill #0

## 2023-02-05 MED ORDER — CEFADROXIL 500 MG PO CAPS
1000.0000 mg | ORAL_CAPSULE | Freq: Two times a day (BID) | ORAL | 0 refills | Status: AC
  Filled 2023-02-05: qty 20, 5d supply, fill #0

## 2023-02-05 MED ORDER — GABAPENTIN 300 MG PO CAPS
300.0000 mg | ORAL_CAPSULE | Freq: Three times a day (TID) | ORAL | 0 refills | Status: DC
  Filled 2023-02-06: qty 90, 30d supply, fill #0

## 2023-02-05 MED ORDER — BUPRENORPHINE HCL-NALOXONE HCL 8-2 MG SL SUBL: 1.0000 | SUBLINGUAL_TABLET | Freq: Two times a day (BID) | SUBLINGUAL | 0 refills | Status: AC

## 2023-02-05 MED ORDER — HYDROXYZINE HCL 10 MG PO TABS
10.0000 mg | ORAL_TABLET | Freq: Four times a day (QID) | ORAL | 0 refills | Status: DC | PRN
  Filled 2023-02-06: qty 30, 8d supply, fill #0

## 2023-02-05 MED ORDER — VITAMIN B-1 100 MG PO TABS
100.0000 mg | ORAL_TABLET | Freq: Every day | ORAL | 3 refills | Status: AC
  Filled 2023-02-06: qty 100, 100d supply, fill #0

## 2023-02-05 MED ORDER — SODIUM CHLORIDE 0.9% FLUSH: 10.0000 mL | INTRAVENOUS | Status: DC | PRN

## 2023-02-05 NOTE — Discharge Summary (Addendum)
Discharge Summary  Jason Buchanan [id number] DOB: 1985-12-10  PCP: [name], NP  Admit date: 01/28/2023 Discharge date: 02/05/2023  Time spent: 35 minutes.  Recommendations for Outpatient Follow-up:  Follow-up with your primary care provider.   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   IVDU (intravenous drug user)     Priority: 6.   Acute psychosis ([place]) 01/28/2023    Resolved Hospital Problems  No resolved problems to display.    Discharge Condition: Stable   Diet recommendation: Resume previous diet.  Vitals:   02/04/23 2026 02/05/23 0457  BP: (!) 148/88 111/69  Pulse: (!) 101 97  Resp: 18 18  Temp: 97.9 F (36.6 C) 97.6 F (36.4 C)  SpO2: 98% 98%    History of present illness:   37 year old male with a past medical history significant for narcotic abuse, schizophrenia, IV drug abuse and recent left hand abscess and tenosynovitis requiring debridement by Dr. Apolonio Schneiders on January 13, 2023 presented to [facility] emergency department complaining of pain in his left hand. The patient had recently been discharged from this facility on February 13 after admission for surgical management of left hand abscess and tenosynovitis. He was discharged on muscle relaxants, narcotics for pain control. He returned to the emergency department complaining of left hand pain and stated that he had recently been injecting IV fentanyl and heroin.    He was admitted by PCCM and was initially on precedex, now off precedex and transferred out of the ICU.  TRH, hospitalist service, assumed care on 02/04/2023.  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  Peripheral blood cultures no growth Wbc count wnl.  Repeat  ct bilateral hands on  2/29 no abscess  Has been on vanc and cefepime,now on ancef,  @@ patient is to leave hospital on 3/5 at 6:30am to reports to [facility] at 7:30 am on 3/5 , please discuss with  ID on 3/4 am for abx recommendation at discharge, needs discharge meds delivered to him on 3/4 from  outpatient pharmacy@@   Resolved, post repletion, hypokalemia /hypomagnesemia Continue to Replace, improved, continue monitor   Mild hyponatremia, improved, serum sodium 134. Increase oral protein calorie intake.   Resolved, mild normocytic anemia:  Hemoglobin 14.1 from 12.2. No overt bleeding reported.    H/o acute psychosis in the setting schizophrenia, Non compliance to meds.  H/o narcotic (heroin) and methamphetamine IV drug abuse:  Initially required icu admission on Precedex drip due to severe agitation Weaned off precedex, out of icu on 2/29 Seen by psych, Continue with Seroquel , prn haldol.  Has been doing well, has not need any prn haldol, he does need prn hydroxyzine for anxiety Follow up with psych outpatient    H/o polysubstance abuse;  UDS is positive for tetrahydrocannabinol and amphetamines, heroin won't show on UDS, reports last use on day of admission He desires to talk to psychiatry, he desires to go to detox facility  Seen by psych who recommended "Patient will benefit from [facility] to Door Transfer at this time. He has been in communication with [facility]. Patient will likely relapse if discharged, encourage timely communication and admission intake completion asap.  " TOC made aware Plan to discharge on 02/06/23 AM.   Chronic pain  He was started on  suboxone by PCCM this admission.  Discharge to Detox facility Psychiatry input appreciated         Estimated body mass index is 24.62 kg/m as calculated from the following:   Height as of this encounter: '5\' 6"'$  (1.676 m).   Weight as of this encounter: 69.2 kg.   Code Status: full code.  DVT Prophylaxis:  enoxaparin (LOVENOX) injection 40 mg Start: 01/28/23 2000     Family Communication: none at bedside.    Disposition Plan:     medically stable to discharge, Patient will benefit from Door to Door Transfer at this time. He has been in communication with [facility]. Patient will likely relapse if discharged, encourage timely communication and admission intake completion asap.     Procedures:  None.    Consultants:   PCCM Dr Caralyn Guile with Orthopedics.  ID Psychiatry  TOC    Discharge Exam: BP 111/69 (BP Location: Right Arm)   Pulse 97   Temp 97.6 F (36.4 C) (Oral)   Resp 18   Ht '5\' 6"'$  (1.676 m)   Wt 69 kg   SpO2 98%   BMI 24.55 kg/m  General: 37 y.o. year-old male well developed well nourished in no acute distress.  Alert and oriented x3. Cardiovascular: Regular rate and rhythm with no rubs or gallops.  No thyromegaly or JVD noted.   Respiratory: Clear to auscultation with no wheezes or rales. Good inspiratory effort. Abdomen: Soft nontender nondistended with normal bowel sounds x4 quadrants. Musculoskeletal: No lower extremity edema. 2/4 pulses in all 4 extremities. Skin: No ulcerative lesions noted or rashes, Psychiatry: Mood is appropriate for condition and setting  Discharge Instructions You were cared for by a hospitalist during your hospital stay. If you have any questions about your discharge medications or the care you received while you were in the hospital after you are discharged, you can call the unit and asked to speak with the hospitalist on call if the hospitalist that took care of you is not available. Once you are discharged, your primary care physician  will handle any further medical issues. Please note that NO REFILLS for any discharge medications will be authorized once you are discharged, as it is imperative that you return to your primary care physician (or establish a relationship with a primary care physician if you do not have one) for your aftercare needs so that they can reassess your need for medications and monitor your lab values.   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 Had prolonged block (>30 min) after succinylcholine for GI procedure.    Chlorhexidine Other (See Comments)   Pt does not remember reaction   Naproxen Other (See Comments)   Stomach upset        Medication List     TAKE these medications    buprenorphine-naloxone 8-2 mg Subl SL tablet Commonly known as: SUBOXONE Place 1 tablet under the tongue 2 (two)  times daily for 7 days.   cefadroxil 500 MG capsule Commonly known as: DURICEF Take 2 capsules (1,000 mg total) by mouth 2 (two) times daily for 5 days.   folic acid 1 MG tablet Commonly known as: FOLVITE Take 1 tablet (1 mg total) by mouth daily. Start taking on: February 06, 2023   gabapentin 300 MG capsule Commonly known as: NEURONTIN Take 1 capsule (300 mg total) by mouth 3 (three) times daily. What changed:  medication strength how much to take   hydrOXYzine 10 MG tablet Commonly known as: ATARAX Take 1 tablet (10 mg total) by mouth every 6 (six) hours as needed for anxiety.   naloxone 4 MG/0.1ML Liqd nasal spray kit Commonly known as: NARCAN Take as needed for Narcotic overdose.   pantoprazole 40 MG tablet Commonly known as: PROTONIX Take 1 tablet (40 mg total) by mouth daily.   polyethylene glycol powder 17 GM/SCOOP powder Commonly known as:  MiraLax Take 17 g by mouth daily as needed (constipation). What changed: when to take this   QUEtiapine 300 MG 24 hr tablet Commonly known as: SEROQUEL XR Take 1 tablet (300 mg total) by mouth at bedtime.   senna 8.6 MG Tabs tablet Commonly known as: SENOKOT Take 2 tablets (17.2 mg total) by mouth at bedtime.   thiamine 100 MG tablet Commonly known as: Vitamin B-1 Take 1 tablet (100 mg total) by mouth daily. Start taking on: February 06, 2023       Allergies  Allergen Reactions   Bee Venom Anaphylaxis, Swelling and Other (See Comments)    Swelling all over   Acetaminophen Rash and Other (See Comments)    Elevated liver enzymes, also   Succinylcholine Other (See Comments)    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 Had prolonged block (>30 min) after succinylcholine for GI procedure.    Chlorhexidine Other (See Comments)    Pt does not remember reaction    Naproxen Other (See Comments)    Stomach upset      The results of significant diagnostics from this hospitalization (including imaging, microbiology, ancillary and laboratory) are listed below for reference.    Significant Diagnostic Studies: CT HAND LEFT W CONTRAST  Result Date: 02/01/2023 CLINICAL DATA:  Soft tissue infection suspected, hand, xray done EXAM: CT OF THE UPPER LEFT EXTREMITY WITH CONTRAST TECHNIQUE: Multidetector CT imaging of the upper left extremity was performed according to the standard protocol following intravenous contrast administration. RADIATION DOSE REDUCTION: This exam was performed according to the departmental dose-optimization program which includes automated exposure control, adjustment of the mA and/or kV according to patient size and/or use of iterative reconstruction technique. CONTRAST:  65m OMNIPAQUE IOHEXOL 300 MG/ML  SOLN COMPARISON:  Left hand radiograph 01/28/2023, 01/13/2023, 07/13/2022, 11/14/2021 FINDINGS: Bones/Joint/Cartilage There is no evidence of acute fracture. There is  articular surface irregularity/erosive changes of the intercarpal joints with bony fusion in the distal carpal row and bony fusion with the scaphoid. There is also bony fusion of the second and third CMC joints. 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 This was noted on prior radiograph in August 2023 and new in comparison to December 2022. Old healed distal fifth metacarpal fracture. Ligaments Suboptimally assessed by CT. Muscles and Tendons There is no acute myotendinous abnormality by CT. There is perching of the ECU tendon at the ulnar edge of the groove. Soft tissues There is diffuse soft tissue swelling of the hand with focal skin irregularity overlying the dorsal aspect  of the radial hand near the base of thumb. There is no well-defined/organized fluid collection by CT. There is skin thickening of the hand. There is no soft tissue gas. IMPRESSION: Diffuse soft tissue swelling of the hand, as can be seen in cellulitis. Focal skin irregularity overlying the dorsal aspect of the radial hand near the base of thumb. No well-defined/organized fluid collection by CT. No soft tissue gas. Extensive erosive changes of the intercarpal joints with bony fusion in the distal carpal row, second and third CMC joints, and the scaphoid. These findings could be chronic posttraumatic change or sequela of prior septic arthritis. Ongoing joint infection is a possibility, though osseous changes are similar to prior radiographs. Electronically Signed   By: Maurine Simmering M.D.   On: 02/01/2023 16:41   CT HAND RIGHT W CONTRAST  Result Date: 02/01/2023 CLINICAL DATA:  Soft tissue infection suspected, hand, xray done per ID recommendation rule out right hand dorsal abscess EXAM: CT OF THE UPPER RIGHT EXTREMITY WITH CONTRAST TECHNIQUE: Multidetector CT imaging of the upper right extremity was performed according to the standard protocol following intravenous contrast  administration. RADIATION DOSE REDUCTION: This exam was performed according to the departmental dose-optimization program which includes automated exposure control, adjustment of the mA and/or kV according to patient size and/or use of iterative reconstruction technique. CONTRAST:  82m OMNIPAQUE IOHEXOL 300 MG/ML  SOLN COMPARISON:  Radiograph 01/12/2022, CT 11/14/2021, hand radiograph 09/27/2021 FINDINGS: Bones/Joint/Cartilage There is no evidence of acute fracture. No focal bone lesion. There is no bone erosion or periostitis. No frank bony destruction. Ligaments Suboptimally assessed by CT. Muscles and Tendons No acute myotendinous abnormality by CT. No evidence of intramuscular collection. Soft tissues There is skin thickening soft tissue swelling of the hand most prominent dorsally. There is no evidence of a well-defined/drainable fluid collection. IMPRESSION: Skin thickening and soft tissue swelling of the right hand, most prominent dorsally, as can be seen in cellulitis. No evidence of soft tissue abscess by CT. Electronically Signed   By: JMaurine SimmeringM.D.   On: 02/01/2023 13:52   DG Hand Complete Left  Result Date: 01/28/2023 CLINICAL DATA:  38year old male history of abscess on the posterior surface of the left hand. Possible infection. Currently detoxing from heroin. EXAM: LEFT HAND - COMPLETE 3+ VIEW COMPARISON:  Left hand radiograph 01/13/2023. FINDINGS: Extensive soft tissue swelling is again noted in the hand, most evident between the first and second metacarpals where there may now be some gas in the intervening soft tissues no acute displaced fracture or dislocation. Extensive mixed lucency and sclerosis throughout the carpal joints again noted, with advanced degenerative changes at the radiocarpal joint, similar to the prior study. IMPRESSION: 1. Gas in the soft tissues between the left first and second metacarpals, which could be postoperative if there has been recent incision/drainage, or  could be indicative of infection with gas-forming organisms. Clinical correlation is recommended. 2. 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 Electronically Signed   By: DVinnie LangtonM.D.   On: 01/28/2023 07:27   DG Hand Complete Left  Result Date: 01/13/2023 CLINICAL DATA:  Left hand abscess.  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 No progressive erosion or bone  loss at this location compared to prior. No sites of erosion or periosteal elevation or seen within the hand. No fracture or dislocation. Diffuse soft tissue swelling. No soft tissue gas. IMPRESSION: 1. Diffuse soft tissue swelling. No evidence to suggest acute osteomyelitis of the left hand. 2. 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 Electronically Signed   By: Davina Poke D.O.   On: 01/13/2023 14:15   DG Chest Portable 1 View  Result Date: 01/10/2023 CLINICAL DATA:  Overdose.  Unresponsive EXAM: PORTABLE CHEST 1 VIEW COMPARISON:  01/24/2019 FINDINGS: Normal heart size and pulmonary vascularity. No focal airspace disease or consolidation in the lungs. No blunting of costophrenic angles. No pneumothorax. Mediastinal contours appear intact. Shallow inspiration. Old rib fractures. IMPRESSION: No active disease. Electronically Signed   By: Lucienne Capers M.D.   On: 01/10/2023 19:24    Microbiology: Recent Results (from the past 240 hour(s))  Culture, blood (routine x 2)     Status: None   Collection Time:  01/28/23  9:10 AM   Specimen: BLOOD LEFT HAND  Result Value Ref Range Status   Specimen Description   Final    BLOOD LEFT HAND Performed at [facility], [address], [place], [place] [address]    Special Requests   Final    BOTTLES DRAWN AEROBIC AND ANAEROBIC Blood Culture results may not be optimal due to an excessive volume of blood received in culture bottles Performed at [place] [address] [place], [place] [address]    Culture   Final    NO GROWTH 5 DAYS Performed at [facility], [place] [address], [place], [place] [address]    Report Status 02/02/2023 FINAL  Final  Culture, blood (routine x 2)     Status: None   Collection Time: 01/28/23  9:45 AM   Specimen: BLOOD RIGHT FOREARM  Result Value Ref Range Status   Specimen Description   Final    BLOOD RIGHT FOREARM Performed at [facility], [place] [address] [place], [place] [address]    Special Requests   Final    BOTTLES DRAWN AEROBIC AND ANAEROBIC Blood Culture adequate volume Performed at [facility] [address] [place], [place] [address]    Culture   Final    NO GROWTH 5 DAYS Performed at [facility], [place] [address], [place], [place] [address]    Report Status 02/02/2023 FINAL  Final     Labs: Basic Metabolic Panel: Recent Labs  Lab 01/30/23 0330 01/31/23 0303 02/04/23 0546  NA 138 136 134*  K 4.5 3.6 4.2  CL 110 102 100  CO2 20* 26 25  GLUCOSE 119* 117* 111*  BUN '7 10 18  '$ CREATININE 0.60* 0.55* 0.57*  CALCIUM 8.0* 8.4* 9.0  MG 1.5* 1.8 1.9  PHOS  --  4.0  --    Liver Function Tests: No results for input(s): "AST", "ALT", "ALKPHOS", "BILITOT", "PROT", "ALBUMIN" in the last 168 hours. No results for input(s): "LIPASE", "AMYLASE" in the last 168 hours. No results for input(s): "AMMONIA" in the last 168 hours. CBC: Recent Labs  Lab 01/30/23 0330 01/31/23 0303 02/04/23 0546  WBC 4.1 5.0 6.3  NEUTROABS  --  2.4 2.2  HGB  11.0* 12.2* 14.1  HCT 33.5* 38.1* 43.8  MCV 89.6 90.1 90.1  PLT 254  310 272   Cardiac Enzymes: No results for input(s): "CKTOTAL", "CKMB", "CKMBINDEX", "TROPONINI" in the last 168 hours. BNP: BNP (last 3 results) No results for input(s): "BNP" in the last 8760 hours.  ProBNP (last 3 results) No results for input(s): "PROBNP" in the last 8760 hours.  CBG: Recent Labs  Lab 01/30/23 1601  GLUCAP 116*       Signed:  Kayleen Memos, MD [facility] 02/05/2023, 1:32 PM

## 2023-02-05 NOTE — TOC Transition Note (Signed)
Transition of Care [facility]) - CM/SW Discharge Note   Patient Details  Name: Jason Buchanan MRN: [id number] Date of Birth: 07-19-1986  Transition of Care [facility]) CM/SW Contact:  Vassie Moselle, LCSW Phone Number: 02/05/2023, 3:55 PM   Clinical Narrative:    Pt will be transferring to [facility] for residential substance use treatment on 3/5. Pt will need to leave at 6:30am. Taxi voucher has been placed on pt's chart. Ride waiver signed and placed in chart. PT will need 30-days of medications to take with him at discharge. Pt recently discharged from hospital using [facility] voucher and is unable to use [facility] again until 02/2024. Pt does not have income to pay for medications. Account will be created for pt to bill to and to be paid at a later time. Medications will be delivered to pt's room by pharmacy.    Final next level of care: Other (comment) ([facility]) Barriers to Discharge: Barriers Resolved   Patient Goals and CMS Choice CMS Medicare.gov Compare Post Acute Care list provided to:: Patient Choice offered to / list presented to : Patient  Discharge Placement                  Patient to be transferred to facility by: [facility] Additional resources added to the After Visit Summary for   In-house Referral: NA Discharge Planning Services: CM Consult Post Acute Care Choice: NA          DME Arranged: N/A DME Agency: NA       HH Arranged: NA [place] Agency: NA        Social Determinants of Health (SDOH) Interventions SDOH Screenings   Food Insecurity: No Food Insecurity (01/13/2023)  Housing: High Risk (01/13/2023)  Transportation Needs: Unmet Transportation Needs (01/13/2023)  Utilities: Not At Risk (01/15/2023)  Recent Concern: Utilities - At Risk (01/13/2023)  Tobacco Use: High Risk (01/14/2023)     Readmission Risk Interventions    02/05/2023    3:54 PM 01/30/2023    1:24 PM 01/15/2023   12:02 PM  Readmission Risk Prevention Plan  Transportation  Screening Complete Complete   PCP or Specialist Appt within 3-5 Days Complete    HRI or [place] Complete    Social Work Consult for [place] Planning/Counseling     [place] Not Applicable    Medication Review Press photographer) Complete Complete   PCP or Specialist appointment within 3-5 days of discharge  Complete   HRI or [place]  Complete   SW Recovery Care/Counseling Consult  Complete   [place]  Not Applicable      Information is confidential and restricted. Go to Review Flowsheets to unlock data.

## 2023-02-05 NOTE — Progress Notes (Signed)
Mobility Specialist - Progress Note   02/05/23 1227  Mobility  Activity Ambulated independently in hallway  Level of Assistance Independent  Assistive Device None  Distance Ambulated (ft) 500 ft  Activity Response Tolerated well  Mobility Referral Yes  $Mobility charge 1 Mobility   Pt received in bench eating lunch and agreeable to mobility. No complaints during session. Pt to bench to finish lunch after session with all needs met.    [facility]

## 2023-02-05 NOTE — Progress Notes (Signed)
    [facility] for Infectious Disease    Date of Admission:  01/28/2023   Total days of antibiotics 9   ID: Jason Buchanan is a 37 y.o. male with  bilateral hand cellulitis Principal Problem:   Amphetamine and psychostimulant-induced mood disorder (HCC) Active Problems:   Polysubstance abuse ([place])   IVDU (intravenous drug user)   Acute psychosis ([place])    Subjective: Much improved. Patient reports slight discomfort to left forehand  Medications:   buprenorphine-naloxone  1 tablet Sublingual BID   cefadroxil  1,000 mg Oral BID   enoxaparin (LOVENOX) injection  40 mg Subcutaneous [id number]   folic acid  1 mg Oral Daily   gabapentin  300 mg Oral TID   pantoprazole  40 mg Oral Daily   QUEtiapine  300 mg Oral QHS   senna  2 tablet Oral QHS   thiamine  100 mg Oral Daily    Objective: Vital signs in last 24 hours: Temp:  [97.6 F (36.4 C)-97.9 F (36.6 C)] 97.6 F (36.4 C) (03/04 0457) Pulse Rate:  [97-101] 97 (03/04 0457) Resp:  [18] 18 (03/04 0457) BP: (111-148)/(69-88) 111/69 (03/04 0457) SpO2:  [98 %] 98 % (03/04 0457) Weight:  [69 kg] 69 kg (03/04 0500)  Physical Exam  Constitutional: He is oriented to person, place, and time. He appears well-developed and well-nourished. No distress.  HENT:  Mouth/Throat: Oropharynx is clear and moist. No oropharyngeal exudate.  Cardiovascular: Normal rate, regular rhythm and normal heart sounds. Exam reveals no gallop and no friction rub.  No murmur heard.  Pulmonary/Chest: Effort normal and breath sounds normal. No respiratory distress. He has no wheezes.  Ext: left dorsum of hand, dry eschar no surrounding erythema Skin: Skin is warm and dry. No rash noted. No erythema.  Psychiatric: He has a normal mood and affect. His behavior is normal.    Lab Results Recent Labs    02/04/23 0546  WBC 6.3  HGB 14.1  HCT 43.8  NA 134*  K 4.2  CL 100  CO2 25  BUN 18  CREATININE 0.57*   Microbiology: reviewed Studies/Results: No  results found.   Assessment/Plan: Cellulitis = finish out course of treatment with 5 days of cefadroxil '1000mg'$  po bid  Anxiety = continue with atarax to help with symptoms management  Substance abuse = being [facility]. Will sign off.  [facility] for Infectious Diseases Pager: [phone]  02/05/2023, 1:31 PM

## 2023-02-05 NOTE — Plan of Care (Signed)
Verbal education provided

## 2023-02-06 ENCOUNTER — Other Ambulatory Visit (HOSPITAL_COMMUNITY): Payer: Self-pay

## 2023-02-06 MED ORDER — BUPRENORPHINE HCL-NALOXONE HCL 8-2 MG SL SUBL
1.0000 | SUBLINGUAL_TABLET | Freq: Two times a day (BID) | SUBLINGUAL | 0 refills | Status: DC
  Filled 2023-02-06: qty 14, 7d supply, fill #0

## 2023-02-06 NOTE — Progress Notes (Signed)
After patient was discharged at approximately 0630 this AM, patient arrived back on the unit around 0800 saying that he could not get his medications.  Per TOC's notes, medications were supposed to be delivered to the patients room from meds to beds ([place]).   The hard scripts for multiple narcotics he presented had not been signed by the physician.  Dr. Nevada Crane notified and came and signed prescriptions.  AC called for another cab voucher to [employer].  [facility] was contacted and prescriptions taken and brought to patient.  [employer] called and patient with his medications were placed in taxi cab #[phone].     Virginia Rochester, RN

## 2023-02-09 ENCOUNTER — Other Ambulatory Visit (HOSPITAL_COMMUNITY): Payer: Self-pay

## 2023-02-15 ENCOUNTER — Other Ambulatory Visit (HOSPITAL_COMMUNITY): Payer: Self-pay

## 2023-03-11 ENCOUNTER — Other Ambulatory Visit: Payer: Self-pay

## 2023-03-11 ENCOUNTER — Emergency Department (HOSPITAL_COMMUNITY)
Admission: EM | Admit: 2023-03-11 | Discharge: 2023-03-12 | Disposition: A | Discharge status: Home / Self Care | Payer: Medicaid Other | Attending: Emergency Medicine | Admitting: Emergency Medicine

## 2023-03-11 ENCOUNTER — Encounter (HOSPITAL_COMMUNITY): Payer: Self-pay

## 2023-03-11 ENCOUNTER — Emergency Department (HOSPITAL_COMMUNITY): Payer: Medicaid Other

## 2023-03-11 DIAGNOSIS — R451 Restlessness and agitation: Secondary | ICD-10-CM | POA: Diagnosis not present

## 2023-03-11 DIAGNOSIS — E876 Hypokalemia: Secondary | ICD-10-CM | POA: Diagnosis not present

## 2023-03-11 DIAGNOSIS — Z5900 Homelessness unspecified: Secondary | ICD-10-CM | POA: Insufficient documentation

## 2023-03-11 DIAGNOSIS — R109 Unspecified abdominal pain: Secondary | ICD-10-CM | POA: Diagnosis present

## 2023-03-11 DIAGNOSIS — F39 Unspecified mood [affective] disorder: Secondary | ICD-10-CM | POA: Diagnosis not present

## 2023-03-11 DIAGNOSIS — R251 Tremor, unspecified: Secondary | ICD-10-CM | POA: Diagnosis not present

## 2023-03-11 DIAGNOSIS — R4182 Altered mental status, unspecified: Secondary | ICD-10-CM | POA: Insufficient documentation

## 2023-03-11 DIAGNOSIS — F29 Unspecified psychosis not due to a substance or known physiological condition: Secondary | ICD-10-CM | POA: Diagnosis not present

## 2023-03-11 DIAGNOSIS — R52 Pain, unspecified: Secondary | ICD-10-CM

## 2023-03-11 LAB — COMPREHENSIVE METABOLIC PANEL
ALT: 41 U/L (ref 0–44)
AST: 63 U/L — ABNORMAL HIGH (ref 15–41)
Albumin: 3.7 g/dL (ref 3.5–5.0)
Alkaline Phosphatase: 115 U/L (ref 38–126)
Anion gap: 9 (ref 5–15)
BUN: 13 mg/dL (ref 6–20)
CO2: 22 mmol/L (ref 22–32)
Calcium: 8.5 mg/dL — ABNORMAL LOW (ref 8.9–10.3)
Chloride: 108 mmol/L (ref 98–111)
Creatinine, Ser: 0.63 mg/dL (ref 0.61–1.24)
GFR, Estimated: >60 mL/min (ref >60)
Glucose, Bld: 116 mg/dL — ABNORMAL HIGH (ref 70–99)
Potassium: 3 mmol/L — ABNORMAL LOW (ref 3.5–5.1)
Sodium: 139 mmol/L (ref 135–145)
Total Bilirubin: 0.6 mg/dL (ref 0.3–1.2)
Total Protein: 6.5 g/dL (ref 6.5–8.1)

## 2023-03-11 LAB — CBC
HCT: 36.5 % — ABNORMAL LOW (ref 39.0–52.0)
Hemoglobin: 11.6 g/dL — ABNORMAL LOW (ref 13.0–17.0)
MCH: 27.5 pg (ref 26.0–34.0)
MCHC: 31.8 g/dL (ref 30.0–36.0)
MCV: 86.5 fL (ref 80.0–100.0)
Platelets: 351 10*3/uL (ref 150–400)
RBC: 4.22 MIL/uL (ref 4.22–5.81)
RDW: 13.4 % (ref 11.5–15.5)
WBC: 10.6 10*3/uL — ABNORMAL HIGH (ref 4.0–10.5)
nRBC: 0 % (ref 0.0–0.2)

## 2023-03-11 LAB — SALICYLATE LEVEL: Salicylate Lvl: <7 mg/dL — ABNORMAL LOW (ref 7.0–30.0)

## 2023-03-11 LAB — ACETAMINOPHEN LEVEL: Acetaminophen (Tylenol), Serum: <10 ug/mL — ABNORMAL LOW (ref 10–30)

## 2023-03-11 LAB — LACTIC ACID, PLASMA: Lactic Acid, Venous: 1 mmol/L (ref 0.5–1.9)

## 2023-03-11 LAB — ETHANOL: Alcohol, Ethyl (B): <10 mg/dL (ref <10)

## 2023-03-11 LAB — LIPASE, BLOOD: Lipase: 23 U/L (ref 11–51)

## 2023-03-11 MED ORDER — IOHEXOL 300 MG/ML  SOLN
100.0000 mL | Freq: Once | INTRAMUSCULAR | Status: AC | PRN
  Administered 2023-03-11: 100 mL via INTRAVENOUS

## 2023-03-11 MED ORDER — SODIUM CHLORIDE (PF) 0.9 % IJ SOLN
INTRAMUSCULAR | Status: AC
  Filled 2023-03-11: qty 100

## 2023-03-11 MED ORDER — STERILE WATER FOR INJECTION IJ SOLN
INTRAMUSCULAR | Status: AC
  Administered 2023-03-11: 1.2 mL
  Filled 2023-03-11: qty 10

## 2023-03-11 MED ORDER — ZIPRASIDONE MESYLATE 20 MG IM SOLR
20.0000 mg | Freq: Once | INTRAMUSCULAR | Status: AC
  Administered 2023-03-11: 20 mg via INTRAMUSCULAR
  Filled 2023-03-11: qty 20

## 2023-03-11 NOTE — ED Notes (Signed)
IV team at bedside 

## 2023-03-11 NOTE — ED Provider Notes (Signed)
[place] EMERGENCY DEPARTMENT AT [facility] Provider Note   CSN: [id number] Arrival date & time: 03/11/23  2013     History {Add pertinent medical, surgical, social history, OB history to HPI:1} Chief Complaint  Patient presents with   Altered Mental Status    Jason Buchanan is a 37 y.o. male with past medical history significant for polysubstance abuse, IV drug use, previous admissions for sepsis, psychosis stimulant induced psychosis with hallucinations, hepatitis C, mood disorder who presents via ambulance for odd behavior such as shaking his head, mumbling.  He is endorsing abdominal pain.  He is sleeping but easily agitated, he is homeless.  He could not tell me the year and his name but otherwise his answers are nonsensical.   Altered Mental Status      Home Medications Prior to Admission medications   Medication Sig Start Date End Date Taking? Authorizing Provider  buprenorphine-naloxone (SUBOXONE) 8-2 mg SUBL SL tablet Place 1 tablet under the tongue 2 (two) times daily for 7 days 02/05/23   Darlin Drop, DO  folic acid (FOLVITE) 1 MG tablet Take 1 tablet (1 mg total) by mouth daily. 02/06/23 05/07/23  Darlin Drop, DO  gabapentin (NEURONTIN) 300 MG capsule Take 1 capsule (300 mg total) by mouth 3 (three) times daily. 02/05/23 03/08/23  Darlin Drop, DO  hydrOXYzine (ATARAX) 10 MG tablet Take 1 tablet (10 mg total) by mouth every 6 (six) hours as needed for anxiety. 02/05/23   Darlin Drop, DO  naloxone [facility]) nasal spray 4 mg/0.1 mL Take as needed for Narcotic overdose. Patient not taking: Reported on 01/29/2023 12/12/22   Glyn Ade, MD  pantoprazole (PROTONIX) 40 MG tablet Take 1 tablet (40 mg total) by mouth daily. 01/18/22 01/29/23  Jerald Kief, MD  polyethylene glycol powder [facility]) 17 GM/SCOOP powder Take 17 g by mouth daily as needed (constipation). Patient taking differently: Take 17 g by mouth daily. 01/16/23   Shalhoub, Deno Lunger, MD  QUEtiapine  (SEROQUEL XR) 300 MG 24 hr tablet Take 1 tablet (300 mg total) by mouth at bedtime. 01/17/22 01/29/23  Jerald Kief, MD  senna (SENOKOT) 8.6 MG TABS tablet Take 2 tablets (17.2 mg total) by mouth at bedtime. Patient not taking: Reported on 01/29/2023 01/16/23   Marinda Elk, MD  thiamine (VITAMIN B-1) 100 MG tablet Take 1 tablet (100 mg total) by mouth daily 02/06/23 05/17/23  Darlin Drop, DO      Allergies    Bee venom, Acetaminophen, Succinylcholine, Chlorhexidine, and Naproxen    Review of Systems   Review of Systems  Reason unable to perform ROS: AMS.    Physical Exam Updated Vital Signs BP (!) 179/136 (BP Location: Left Arm) Comment: Pt moving around on the bed and moaning while vitals being obtained  Pulse 100   Temp 97.7 F (36.5 C) (Oral)   Resp (!) 22   Ht 5\' 6"  (1.676 m)   Wt 70.3 kg   SpO2 98%   BMI 25.02 kg/m  Physical Exam Vitals and nursing note reviewed.  Constitutional:      Appearance: He is ill-appearing.     Comments: Agitated, disheveled, foul-smelling, water soaked socks and pants  HENT:     Head: Normocephalic and atraumatic.  Eyes:     General:        Right eye: No discharge.        Left eye: No discharge.     Comments: Pupils are 8  mm on my exam  Cardiovascular:     Rate and Rhythm: Regular rhythm. Tachycardia present.     Heart sounds: No murmur heard.    No friction rub. No gallop.  Pulmonary:     Effort: Pulmonary effort is normal.     Breath sounds: Normal breath sounds.     Comments: Some tachypnea with no wheezing, rhonchi, stridor, rales on my exam Abdominal:     General: Bowel sounds are normal.     Palpations: Abdomen is soft.     Comments: Generalized tenderness to palpation of the abdomen without rigidity, rebound, guarding, normal bowel sounds throughout.  Skin:    General: Skin is warm and dry.     Capillary Refill: Capillary refill takes less than 2 seconds.     Comments: Patient with slight diffuse redness around hands,  I do not see any cellulitis on exposed skin surfaces, he has some remote track marks with no evidence of cellulitis or abscess on AC fossa at this time  Neurological:     Mental Status: He is oriented to person, place, and time.  Psychiatric:        Mood and Affect: Mood normal.        Behavior: Behavior normal.     ED Results / Procedures / Treatments   Labs (all labs ordered are listed, but only abnormal results are displayed) Labs Reviewed  CULTURE, BLOOD (ROUTINE X 2)  CULTURE, BLOOD (ROUTINE X 2)  LIPASE, BLOOD  COMPREHENSIVE METABOLIC PANEL  CBC  URINALYSIS, ROUTINE W REFLEX MICROSCOPIC  RAPID URINE DRUG SCREEN, HOSP PERFORMED  ACETAMINOPHEN LEVEL  SALICYLATE LEVEL  ETHANOL  LACTIC ACID, PLASMA  LACTIC ACID, PLASMA    EKG None  Radiology No results found.  Procedures Procedures  {Document cardiac monitor, telemetry assessment procedure when appropriate:1}  Medications Ordered in ED Medications - No data to display  ED Course/ Medical Decision Making/ A&P   {   Click here for ABCD2, HEART and other calculatorsREFRESH Note before signing :1}                          Medical Decision Making Amount and/or Complexity of Data Reviewed Labs: ordered. Radiology: ordered.   ***  {Document critical care time when appropriate:1} {Document review of labs and clinical decision tools ie heart score, Chads2Vasc2 etc:1}  {Document your independent review of radiology images, and any outside records:1} {Document your discussion with family members, caretakers, and with consultants:1} {Document social determinants of health affecting pt's care:1} {Document your decision making why or why not admission, treatments were needed:1} Final Clinical Impression(s) / ED Diagnoses Final diagnoses:  None    Rx / DC Orders ED Discharge Orders     None

## 2023-03-11 NOTE — ED Notes (Signed)
20mg  IM Geodon given for agitation after several failed attempts to obtain IV access.

## 2023-03-11 NOTE — ED Notes (Signed)
Pt transported to CT ?

## 2023-03-11 NOTE — ED Triage Notes (Signed)
Pt BIB EMS for AMS. Pt found by EMS and had odd behavior such as shaking his head and mumbling. Pt endorses ABD pain. Pt sleeping, but easily agitated. Pt is homeless.

## 2023-03-12 ENCOUNTER — Other Ambulatory Visit: Payer: Self-pay

## 2023-03-12 ENCOUNTER — Emergency Department (HOSPITAL_COMMUNITY)
Admission: EM | Admit: 2023-03-12 | Discharge: 2023-03-13 | Disposition: A | Discharge status: Home / Self Care | Payer: Medicaid Other | Attending: Emergency Medicine | Admitting: Emergency Medicine

## 2023-03-12 ENCOUNTER — Encounter (HOSPITAL_COMMUNITY): Payer: Self-pay

## 2023-03-12 ENCOUNTER — Emergency Department (HOSPITAL_COMMUNITY): Payer: Medicaid Other

## 2023-03-12 DIAGNOSIS — R000 Tachycardia, unspecified: Secondary | ICD-10-CM | POA: Insufficient documentation

## 2023-03-12 DIAGNOSIS — F309 Manic episode, unspecified: Secondary | ICD-10-CM | POA: Diagnosis present

## 2023-03-12 DIAGNOSIS — F1910 Other psychoactive substance abuse, uncomplicated: Secondary | ICD-10-CM | POA: Diagnosis present

## 2023-03-12 DIAGNOSIS — J45909 Unspecified asthma, uncomplicated: Secondary | ICD-10-CM | POA: Diagnosis not present

## 2023-03-12 DIAGNOSIS — F15951 Other stimulant use, unspecified with stimulant-induced psychotic disorder with hallucinations: Secondary | ICD-10-CM | POA: Diagnosis present

## 2023-03-12 DIAGNOSIS — R451 Restlessness and agitation: Secondary | ICD-10-CM | POA: Insufficient documentation

## 2023-03-12 DIAGNOSIS — F15151 Other stimulant abuse with stimulant-induced psychotic disorder with hallucinations: Secondary | ICD-10-CM | POA: Diagnosis not present

## 2023-03-12 DIAGNOSIS — F29 Unspecified psychosis not due to a substance or known physiological condition: Secondary | ICD-10-CM | POA: Diagnosis not present

## 2023-03-12 DIAGNOSIS — Z20822 Contact with and (suspected) exposure to covid-19: Secondary | ICD-10-CM | POA: Insufficient documentation

## 2023-03-12 DIAGNOSIS — F17210 Nicotine dependence, cigarettes, uncomplicated: Secondary | ICD-10-CM | POA: Diagnosis not present

## 2023-03-12 DIAGNOSIS — Z5900 Homelessness unspecified: Secondary | ICD-10-CM | POA: Insufficient documentation

## 2023-03-12 DIAGNOSIS — M79642 Pain in left hand: Secondary | ICD-10-CM | POA: Diagnosis not present

## 2023-03-12 DIAGNOSIS — F1990 Other psychoactive substance use, unspecified, uncomplicated: Secondary | ICD-10-CM

## 2023-03-12 DIAGNOSIS — F23 Brief psychotic disorder: Secondary | ICD-10-CM

## 2023-03-12 LAB — CBC WITH DIFFERENTIAL/PLATELET
Abs Immature Granulocytes: 0.01 10*3/uL (ref 0.00–0.07)
Basophils Absolute: 0.1 10*3/uL (ref 0.0–0.1)
Basophils Relative: 1 %
Eosinophils Absolute: 0.6 10*3/uL — ABNORMAL HIGH (ref 0.0–0.5)
Eosinophils Relative: 7 %
HCT: 32.7 % — ABNORMAL LOW (ref 39.0–52.0)
Hemoglobin: 10.8 g/dL — ABNORMAL LOW (ref 13.0–17.0)
Immature Granulocytes: 0 %
Lymphocytes Relative: 19 %
Lymphs Abs: 1.4 10*3/uL (ref 0.7–4.0)
MCH: 28.3 pg (ref 26.0–34.0)
MCHC: 33 g/dL (ref 30.0–36.0)
MCV: 85.6 fL (ref 80.0–100.0)
Monocytes Absolute: 0.5 10*3/uL (ref 0.1–1.0)
Monocytes Relative: 7 %
Neutro Abs: 4.9 10*3/uL (ref 1.7–7.7)
Neutrophils Relative %: 66 %
Platelets: 353 10*3/uL (ref 150–400)
RBC: 3.82 MIL/uL — ABNORMAL LOW (ref 4.22–5.81)
RDW: 13.6 % (ref 11.5–15.5)
WBC: 7.4 10*3/uL (ref 4.0–10.5)
nRBC: 0 % (ref 0.0–0.2)

## 2023-03-12 LAB — RAPID URINE DRUG SCREEN, HOSP PERFORMED
Amphetamines: POSITIVE — AB
Barbiturates: NOT DETECTED
Benzodiazepines: NOT DETECTED
Cocaine: NOT DETECTED
Opiates: NOT DETECTED
Tetrahydrocannabinol: POSITIVE — AB

## 2023-03-12 LAB — URINALYSIS, ROUTINE W REFLEX MICROSCOPIC
Glucose, UA: NEGATIVE mg/dL
Ketones, ur: 5 mg/dL — AB
Nitrite: NEGATIVE
Protein, ur: 30 mg/dL — AB
RBC / HPF: >50 RBC/hpf (ref 0–5)
Specific Gravity, Urine: 1.027 (ref 1.005–1.030)
pH: 5 (ref 5.0–8.0)

## 2023-03-12 LAB — COMPREHENSIVE METABOLIC PANEL
ALT: 42 U/L (ref 0–44)
AST: 57 U/L — ABNORMAL HIGH (ref 15–41)
Albumin: 3.4 g/dL — ABNORMAL LOW (ref 3.5–5.0)
Alkaline Phosphatase: 112 U/L (ref 38–126)
Anion gap: 8 (ref 5–15)
BUN: 14 mg/dL (ref 6–20)
CO2: 21 mmol/L — ABNORMAL LOW (ref 22–32)
Calcium: 8.7 mg/dL — ABNORMAL LOW (ref 8.9–10.3)
Chloride: 110 mmol/L (ref 98–111)
Creatinine, Ser: 0.77 mg/dL (ref 0.61–1.24)
GFR, Estimated: >60 mL/min (ref >60)
Glucose, Bld: 113 mg/dL — ABNORMAL HIGH (ref 70–99)
Potassium: 3.8 mmol/L (ref 3.5–5.1)
Sodium: 139 mmol/L (ref 135–145)
Total Bilirubin: 0.5 mg/dL (ref 0.3–1.2)
Total Protein: 6 g/dL — ABNORMAL LOW (ref 6.5–8.1)

## 2023-03-12 LAB — ETHANOL: Alcohol, Ethyl (B): <10 mg/dL (ref <10)

## 2023-03-12 LAB — LACTIC ACID, PLASMA: Lactic Acid, Venous: 1.5 mmol/L (ref 0.5–1.9)

## 2023-03-12 LAB — ACETAMINOPHEN LEVEL: Acetaminophen (Tylenol), Serum: <10 ug/mL — ABNORMAL LOW (ref 10–30)

## 2023-03-12 LAB — SALICYLATE LEVEL: Salicylate Lvl: <7 mg/dL — ABNORMAL LOW (ref 7.0–30.0)

## 2023-03-12 MED ORDER — QUETIAPINE FUMARATE ER 300 MG PO TB24
300.0000 mg | ORAL_TABLET | Freq: Every day | ORAL | Status: DC
  Filled 2023-03-12 (×2): qty 1

## 2023-03-12 MED ORDER — SODIUM CHLORIDE 0.9 % IV BOLUS: 1000.0000 mL | Freq: Once | INTRAVENOUS | Status: DC

## 2023-03-12 MED ORDER — GABAPENTIN 300 MG PO CAPS
300.0000 mg | ORAL_CAPSULE | Freq: Three times a day (TID) | ORAL | Status: DC
  Administered 2023-03-12 – 2023-03-13 (×2): 300 mg via ORAL
  Filled 2023-03-12 (×2): qty 1

## 2023-03-12 MED ORDER — STERILE WATER FOR INJECTION IJ SOLN
INTRAMUSCULAR | Status: AC
  Administered 2023-03-12: 1.2 mL
  Filled 2023-03-12: qty 10

## 2023-03-12 MED ORDER — KETOROLAC TROMETHAMINE 15 MG/ML IJ SOLN
15.0000 mg | Freq: Once | INTRAMUSCULAR | Status: AC
  Administered 2023-03-12: 15 mg via INTRAVENOUS
  Filled 2023-03-12: qty 1

## 2023-03-12 MED ORDER — ZIPRASIDONE MESYLATE 20 MG IM SOLR
20.0000 mg | Freq: Two times a day (BID) | INTRAMUSCULAR | Status: DC | PRN
  Administered 2023-03-12: 20 mg via INTRAMUSCULAR
  Filled 2023-03-12: qty 20

## 2023-03-12 MED ORDER — LORAZEPAM 1 MG PO TABS
2.0000 mg | ORAL_TABLET | Freq: Once | ORAL | Status: AC
  Administered 2023-03-12: 2 mg via ORAL
  Filled 2023-03-12: qty 2

## 2023-03-12 MED ORDER — HYDROXYZINE HCL 50 MG PO TABS
50.0000 mg | ORAL_TABLET | Freq: Four times a day (QID) | ORAL | Status: DC | PRN
  Administered 2023-03-13: 50 mg via ORAL
  Filled 2023-03-12: qty 1

## 2023-03-12 MED ORDER — POTASSIUM CHLORIDE CRYS ER 20 MEQ PO TBCR
40.0000 meq | EXTENDED_RELEASE_TABLET | Freq: Once | ORAL | Status: AC
  Administered 2023-03-12: 40 meq via ORAL
  Filled 2023-03-12: qty 2

## 2023-03-12 NOTE — ED Provider Notes (Signed)
4:42 AM Patient care assumed from Prosper[place], PA-C at shift change.  Patient has been observed for a number of hours. Initially reported abdominal pain. His abdominal CT is negative for acute process. Head CT also without acute intracranial abnormality; this was added given his AMS on arrival.  Patient presently wakes easily. His speech is clear, goal oriented. He answers questions appropriately. He is complaining of BLE pain from frequent walking. He is moving all of his extremities spontaneously. No indication for further emergent work up or imaging. Will give IV toradol as oral NSAIDs cause GI upset. Stable for discharge.   Antony Madura, PA-C 03/12/23 [id number]    Nicanor Alcon, April, MD 03/12/23 [phone]

## 2023-03-12 NOTE — ED Notes (Signed)
Dr. Jodi Mourning notified that the patient received Geodon and is unable to wake to take oral medications. Dr. Jodi Mourning ordered to hold 2200 medications and cancel repeat Lactic lab since 1 Lactic lab value WNL

## 2023-03-12 NOTE — Consult Note (Signed)
[facility] ED ASSESSMENT   Reason for Consult:  Manic behavior Referring Physician:  Jodi Mourning Patient Identification: Jason Buchanan MRN:  [id number] ED Chief Complaint: Amphetamine and psychostimulant-induced psychosis with hallucinations  Diagnosis:  Principal Problem:   Amphetamine and psychostimulant-induced psychosis with hallucinations Active Problems:   Polysubstance abuse   ED Assessment Time Calculation: Start Time: 1700 Stop Time: 1800 Total Time in Minutes (Assessment Completion): 60   HPI:   Jason Buchanan is a 37 y.o. male patient who originally presented to [facility] yesterday after being found by EMS with odd behavior such as shaking his head and mumbling. He endorsed abdominal pain, he's homeless, and became easily agitated. Pt was given Geodon IM and showed significant imporvement, was able to participate in assessment, medically cleared, and discharged early this morning.   Pt represents to hospital this evening after being found by EMS outside of [employer] in downtown [place] for manic like behaviors. 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 Pt last presented similar to this early March, was restarted on his medications, and went door to door transfer to [facility] residential center. Yesterday at [facility] it also appears patient was mumbling, disorganized, and agitated. After geodon injection patient showed significant improvements and discharged.   Subjective:   Patient seen at [facility] for face to face psychiatric evaluation. Upon evaluation patient is restlessness, twitching, and mumbling to himself with his eyes closed. He is difficult to understand at times but he is oriented x4. He tells me in early March he Buchanan go to [facility] but left  after 13 days. Since then he has relapsed on drugs, specifically meth. When asked if patient had used drugs today he states "Jason Buchanan." I asked him to clarify and he mentioned he has two personalities, Jason Buchanan, and Jason Buchanan is the good guy who doesn't do drugs. He states Jason Buchanan does all kind of drugs and who knows what Jason Buchanan Buchanan today. Patient unable to confirm substance use. UDS is pending.   He is wanting to go back to [facility]. He is endorsing passive suicidal ideations. No plan or intent. Denies HI. Endorses chronic AH, voices worsen with drug use. Denies command hallucinations. Denies visual hallucinations. He is homeless, he does have history of frequent hospital visitations/malingering concerns. He states he wants to go back to [facility] and complete the residential program. He also is wanting to restart his Seroquel and Gabapentin. He hasn't taken any medications in a week he reports.   Per chart review, it does appear when patient presents similar to this he will quickly improve with sobriety and medications. Pt ultimate goal is to return to [facility]. I called [facility] at [phone] and spoke with Jason Buchanan who stated there is a return policy and patients have to wait 30 days after they leave the program before they can return. He also mentions that there are no more intakes after 1700, and patient will need to call at 8 am to speak with Jason Buchanan about intake/return.   Spoke with Vernard Gambles, NP at [facility] who is unable to accommodate patient transferring for overnight observation once medically cleared due to acuity of current patients/no space due to flex area being blocked as well.   Ultimately, will recommend  overnight observation with reevaluation by psychiatry in the morning. Pt Buchanan have difficulty engaging in conversation, would like for patient to have another assessment tomorrow once more sober/clear. Will restart Seroquel 300 mg Qhs and Gabapentin 300 mg  TID. Will udpate ED team with disposition. Pt is voluntary at this time, I do not think he would meet IVC criteria due to history and frequency of hospital presentations and drug relapses. Will add resources to AVS in case patient decides he would like to leave AMA.   Past Psychiatric History:  See above  Risk to Self or Others: Is the patient at risk to self? Yes Has the patient been a risk to self in the past 6 months? No Has the patient been a risk to self within the distant past? No Is the patient a risk to others? Yes Has the patient been a risk to others in the past 6 months? No Has the patient been a risk to others within the distant past? No  [place] Scale:  Flowsheet Row ED from [facility]12[facility]20[facility] from [facility] ED to Hosp-Admission (Discharged) from [facility]20[facility]  C-SSRS RISK CATEGORY No Risk No Risk No Risk       Past Medical History:  Past Medical History:  Diagnosis Date   Asthma    Mood disorder    Polysubstance abuse    Schizophrenia     Past Surgical History:  Procedure Laterality Date   ANKLE SURGERY     APPLICATION OF WOUND VAC Right 11/22/2021   Procedure: APPLICATION OF WOUND VAC;  Surgeon: Knute Neuoley, Harrill, MD;  Location: [facility];  Service: Plastics;  Laterality: Right;   APPLICATION OF WOUND VAC Right 11/25/2021   Procedure: APPLICATION OF WOUND VAC;  Surgeon: Knute Neuoley, Harrill, MD;  [facility]: [facility];  Service: Plastics;  Laterality: Right;   APPLICATION OF WOUND VAC Right 12/07/2021   Procedure: APPLICATION OF WOUND VAC;  Surgeon: Knute Neuoley, Harrill, MD;  [facility]: [facility];  Service: Plastics;  Laterality: Right;   APPLICATION OF WOUND VAC Right 12/14/2021   Procedure: APPLICATION OF WOUND VAC;  Surgeon: Janne NapoleonLuppens, Daniel, MD;  [facility]: [facility];  Service: Plastics;  Laterality: Right;   APPLICATION OF WOUND VAC  Right 12/22/2021   Procedure: WOUND VAC CHANGE;  Surgeon: Janne NapoleonLuppens, Daniel, MD;  [facility]: [facility];  Service: Plastics;  Laterality: Right;   APPLICATION OF WOUND VAC Right 12/29/2021   Procedure: APPLICATION OF WOUND VAC;  Surgeon: Janne NapoleonLuppens, Daniel, MD;  [facility]: [facility];  Service: Plastics;  Laterality: Right;   APPLICATION OF WOUND VAC Right 01/03/2022   Procedure: WOUND VAC APPLICATION;  Surgeon: Janne NapoleonLuppens, Daniel, MD;  [facility]: [facility];  Service: Plastics;  Laterality: Right;   BIOPSY  11/17/2021   Procedure: BIOPSY;  Surgeon: Vida RiggerMagod, Marc, MD;  Location: [facility];  Service: Endoscopy;;   BUBBLE STUDY  09/30/2021   Procedure: BUBBLE STUDY;  Surgeon: Orpah CobbKadakia, Ajay, MD;  [facility];  Service: Cardiovascular;;   DEBRIDEMENT AND CLOSURE WOUND Right 01/03/2022   Procedure: DEBRIDEMENT AND COMPLEX CLOSURE OF RIGHT ARM WOUND;  Surgeon: Janne NapoleonLuppens, Daniel, MD;  [facility]: [facility];  Service: Plastics;  Laterality: Right;   ESOPHAGOGASTRODUODENOSCOPY (EGD) WITH PROPOFOL N/A 11/17/2021   Procedure: ESOPHAGOGASTRODUODENOSCOPY (EGD) WITH PROPOFOL;  Surgeon: Vida RiggerMagod, Marc, MD;  [facility];  Service: Endoscopy;  Laterality: N/A;   FRACTURE SURGERY  I & D EXTREMITY Right 09/27/2021   Procedure: IRRIGATION AND DEBRIDEMENT OF ABSCESS RIGHT HAND;  Surgeon: Knute Neu, MD;  Location: [facility];  Service: Plastics;  Laterality: Right;   I & D EXTREMITY Bilateral 11/15/2021   Procedure: IRRIGATION AND DEBRIDEMENT EXTREMITY;  Surgeon: Knute Neu, MD;  [facility]: [facility];  Service: Plastics;  Laterality: Bilateral;   I & D EXTREMITY Right 11/17/2021   Procedure: IRRIGATION AND DEBRIDEMENT EXTREMITY;  Surgeon: Knute Neu, MD;  [facility]: [facility];  Service: Plastics;  Laterality: Right;   I & D EXTREMITY Bilateral 12/01/2021   Procedure: INCISION AND DRAINAGE BILATERAL UPPER EXTREMITIES WITH  WOUND VAC CHANGE RIGHT;  Surgeon: Gomez Cleverly, MD;  Location: [facility];  Service: Orthopedics;   Laterality: Bilateral;   I & D EXTREMITY Right 12/07/2021   Procedure: IRRIGATION AND DEBRIDEMENT EXTREMITY, MANIPULATION OF FINGERS UNDER ANESTHESIA;  Surgeon: Knute Neu, MD;  [facility]: [facility];  Service: Plastics;  Laterality: Right;   I & D EXTREMITY Left 01/13/2023   Procedure: IRRIGATION AND DEBRIDEMENT EXTREMITY;  Surgeon: Bradly Bienenstock, MD;  [facility]: [facility];  Service: Orthopedics;  Laterality: Left;   INCISION AND DRAINAGE OF WOUND Left 09/27/2021   Procedure: IRRIGATION AND DEBRIDEMENT OF ABSCESS LEFT INDEX FINGER;  Surgeon: Knute Neu, MD;  Location: [facility];  Service: Plastics;  Laterality: Left;   INCISION AND DRAINAGE OF WOUND Bilateral 11/22/2021   Procedure: IRRIGATION AND DEBRIDEMENT WOUND;  Surgeon: Knute Neu, MD;  [facility]: [facility];  Service: Plastics;  Laterality: Bilateral;   INCISION AND DRAINAGE OF WOUND Bilateral 11/25/2021   Procedure: BILATERAL IRRIGATION AND DEBRIDEMENT BILATERAL ARMS;  Surgeon: Knute Neu, MD;  [facility]: [facility];  Service: Plastics;  Laterality: Bilateral;   INCISION AND DRAINAGE OF WOUND Right 12/14/2021   Procedure: IRRIGATION AND DEBRIDEMENT RIGHT ARM;  Surgeon: [name], MD;  [facility]: [facility];  Service: Plastics;  Laterality: Right;   INCISION AND DRAINAGE OF WOUND Right 12/22/2021   Procedure: IRRIGATION AND DEBRIDEMENT ARM  WOUND;  Surgeon: [name], MD;  [facility]: [facility];  Service: Plastics;  Laterality: Right;  1 hour   INCISION AND DRAINAGE OF WOUND Right 12/29/2021   Procedure: DEBRIDEMENT RIGHT ARM  WOUND;  Surgeon: [name], MD;  [facility]: [facility];  Service: Plastics;  Laterality: Right;   JOINT REPLACEMENT     SCLEROTHERAPY  11/17/2021   Procedure: Susa Day;  Surgeon: Vida Rigger, MD;  [facility]: [facility];  Service: Endoscopy;;   SKIN SPLIT GRAFT Right 01/03/2022   Procedure: SKIN GRAFT SPLIT THICKNESS;  Surgeon: [name], MD;  [facility];  Service: Plastics;  Laterality: Right;   TEE WITHOUT  CARDIOVERSION N/A 09/30/2021   Procedure: TRANSESOPHAGEAL ECHOCARDIOGRAM (TEE);  Surgeon: Orpah Cobb, MD;  Location: [facility] ENDOSCOPY;  Service: Cardiovascular;  Laterality: N/A;   Family History: History reviewed. No pertinent family history.  Social History:  Social History   Substance and Sexual Activity  Alcohol Use Not Currently   Comment: occasionally     Social History   Substance and Sexual Activity  Drug Use Yes   Types: IV, Marijuana   Comment: heroin, meth    Social History   Socioeconomic History   Marital status: Single    Spouse name: Not on file   Number of children: Not on file   Years of education: Not on file   Highest education level: Not on file  Occupational History   Not on file  Tobacco Use   Smoking status: Some Days  Packs/day: .5    Types: Cigarettes   Smokeless tobacco: Never  Substance and Sexual Activity   Alcohol use: Not Currently    Comment: occasionally   Drug use: Yes    Types: IV, Marijuana    Comment: heroin, meth   Sexual activity: Not on file  Other Topics Concern   Not on file  Social History Narrative   ** Merged History Encounter **       Social Determinants of Health   Financial Resource Strain: Not on file  Food Insecurity: No Food Insecurity (01/13/2023)   Hunger Vital Sign    Worried About Running Out of Food in the Last Year: Never true    Ran Out of Food in the Last Year: Never true  Transportation Needs: Unmet Transportation Needs (01/13/2023)   PRAPARE - Administrator, Civil Service (Medical): Yes    Lack of Transportation (Non-Medical): Yes  Physical Activity: Not on file  Stress: Not on file  Social Connections: Not on file   Additional Social History:    Allergies:   Allergies  Allergen Reactions   Bee Venom Anaphylaxis, Swelling and Other (See Comments)    Swelling all over   Acetaminophen Rash and Other (See Comments)    Elevated liver enzymes, also   Succinylcholine Other (See  Comments)    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 Had prolonged block (>30 min) after succinylcholine for GI procedure.    Chlorhexidine Other (See Comments)    Pt does not remember reaction    Naproxen Other (See Comments)    Stomach upset    Labs:  Results for orders placed or performed during the hospital encounter of 03/11/23 (from the past 48 hour(s))  Lipase, blood     Status: None   Collection Time: 03/11/23  9:56 PM  Result Value Ref Range   Lipase 23 11 - 51 U/L    Comment: Performed at [facility], [address], [place], [place] [address]  Comprehensive metabolic panel     Status: Abnormal   Collection Time: 03/11/23  9:56 PM  Result Value Ref Range   Sodium 139 135 - 145 mmol/L   Potassium 3.0 (L) 3.5 - 5.1 mmol/L   Chloride 108 98 - 111 mmol/L   CO2 22 22 - 32 mmol/L   Glucose, Bld 116 (H) 70 - 99 mg/dL    Comment: Glucose reference range applies only to samples taken after fasting for at least 8 hours.   BUN 13 6 - 20 mg/dL   Creatinine, Ser 7.26 0.61 - 1.24 mg/dL   Calcium 8.5 (L) 8.9 - 10.3 mg/dL   Total Protein 6.5 6.5 - 8.1 g/dL   Albumin 3.7 3.5 - 5.0 g/dL   AST 63 (H) 15 - 41 U/L   ALT 41 0 - 44 U/L   Alkaline Phosphatase 115 38 - 126 U/L   Total Bilirubin 0.6 0.3 - 1.2 mg/dL   GFR, Estimated >20 >35 mL/min    Comment: (NOTE) Calculated using the CKD-EPI Creatinine Equation (2021)    Anion gap 9 5 - 15    Comment: Performed at [facility], [address] [place], [place] [address]  CBC     Status: Abnormal   Collection Time: 03/11/23  9:56 PM  Result Value Ref Range   WBC 10.6 (H) 4.0 - 10.5 K/uL   RBC 4.22 4.22 - 5.81 MIL/uL   Hemoglobin 11.6 (L) 13.0 - 17.0 g/dL   HCT 63.8 (L)  39.0 - 52.0 %   MCV 86.5 80.0 - 100.0 fL   MCH 27.5 26.0 - 34.0 pg   MCHC 31.8 30.0 - 36.0 g/dL   RDW 16.1 09.6 - 04.5 %   Platelets 351 150 - 400 K/uL   nRBC 0.0 0.0 - 0.2 %    Comment: Performed at [facility], [address], [place], [place] [address]  Acetaminophen level     Status: Abnormal   Collection Time: 03/11/23  9:56 PM  Result Value Ref Range   Acetaminophen (Tylenol), Serum <10 (L) 10 - 30 ug/mL    Comment: (NOTE) Therapeutic concentrations vary significantly. A range of 10-30 ug/mL  may be an effective concentration for many patients. However, some  are best treated at concentrations outside of this range. Acetaminophen concentrations >150 ug/mL at 4 hours after ingestion  and >50 ug/mL at 12 hours after ingestion are often associated with  toxic reactions.  Performed at [facility], [address] [place], [place] [address]   Salicylate level     Status: Abnormal   Collection Time: 03/11/23  9:56 PM  Result Value Ref Range   Salicylate Lvl <7.0 (L) 7.0 - 30.0 mg/dL    Comment: Performed at [facility], [address], [place], [place] [address]  Ethanol     Status: None   Collection Time: 03/11/23  9:56 PM  Result Value Ref Range   Alcohol, Ethyl (B) <10 <10 mg/dL    Comment: (NOTE) Lowest detectable limit for serum alcohol is 10 mg/dL.  For medical purposes only. Performed at [facility], [address] [place], [place] [address]   Lactic acid, plasma     Status: None   Collection Time: 03/11/23  9:56 PM  Result Value Ref Range   Lactic Acid, Venous 1.0 0.5 - 1.9 mmol/L    Comment: Performed at [facility], [address], [place], [place] [address]    Current Facility-Administered Medications  Medication Dose Route Frequency Provider Last Rate Last Admin   gabapentin (NEURONTIN) capsule 300 mg  300 mg Oral TID Eligha Bridegroom, NP       LORazepam (ATIVAN) tablet 2 mg  2 mg Oral Once Blane Ohara, MD       QUEtiapine (SEROQUEL XR) 24 hr tablet 300 mg  300 mg Oral QHS Eligha Bridegroom, NP       sodium chloride 0.9 % bolus 1,000 mL  1,000 mL Intravenous Once Blane Ohara, MD       ziprasidone (GEODON) injection 20 mg  20 mg Intramuscular Q12H PRN Eligha Bridegroom, NP       Current Outpatient Medications  Medication Sig Dispense Refill   buprenorphine-naloxone (SUBOXONE) 8-2 mg SUBL SL tablet Place 1 tablet under the tongue 2 (two) times daily for 7 days 14 tablet 0   folic acid (FOLVITE) 1 MG tablet Take 1 tablet (1 mg total) by mouth daily. 90 tablet 0   gabapentin (NEURONTIN) 300 MG capsule Take 1 capsule (300 mg total) by mouth 3 (three) times daily. 90 capsule 0   hydrOXYzine (ATARAX) 10 MG tablet Take 1 tablet (10 mg total) by mouth every 6 (six) hours as needed for anxiety. 30 tablet 0   naloxone (NARCAN) nasal spray 4 mg/0.1 mL Take as needed for Narcotic overdose. (Patient not taking: Reported on 01/29/2023) 1 each 0   pantoprazole (PROTONIX) 40 MG tablet Take 1 tablet (40 mg total) by mouth daily.  30 tablet 0   polyethylene glycol powder (MIRALAX) 17 GM/SCOOP powder Take 17 g by mouth daily as needed (constipation). (Patient taking differently: Take 17 g by mouth daily.) 255 g 0   QUEtiapine (SEROQUEL XR) 300 MG 24 hr tablet Take 1 tablet (300 mg total) by mouth at bedtime. 30 tablet 0   senna (SENOKOT) 8.6 MG TABS tablet Take 2 tablets (17.2 mg total) by mouth at bedtime. (Patient not taking: Reported on 01/29/2023) 120 tablet 0   thiamine (VITAMIN B-1) 100 MG tablet Take 1 tablet (100 mg total) by mouth daily 100 tablet 3   Psychiatric Specialty Exam: Presentation  General Appearance:  Fairly Groomed  Eye Contact: Fair  Speech: Garbled  Speech Volume: Normal  Handedness:No data recorded  Mood and Affect  Mood: Anxious  Affect: Labile   Thought Process  Thought Processes: Disorganized  Descriptions of Associations:Tangential  Orientation:Full (Time, Place and Person)  Thought Content:Illogical; Tangential  History of Schizophrenia/Schizoaffective disorder:No data recorded Duration of Psychotic Symptoms:No data  recorded Hallucinations:Hallucinations: Auditory  Ideas of Reference:Delusions  Suicidal Thoughts:Suicidal Thoughts: Yes, Passive SI Passive Intent and/or Plan: Without Intent; Without Plan  Homicidal Thoughts:Homicidal Thoughts: No   Sensorium  Memory: Immediate Fair; Recent Fair  Judgment: Poor  Insight: Poor   Executive Functions  Concentration: Poor  Attention Span: Poor  Recall: [employer] of Knowledge: Fair  Language: Fair   Psychomotor Activity  Psychomotor Activity: Psychomotor Activity: Restlessness   Assets  Assets: Desire for Improvement; Leisure Time    Sleep  Sleep:No data recorded  Physical Exam: Physical Exam Neurological:     Mental Status: He is alert and oriented to person, place, and time.  Psychiatric:        Attention and Perception: He is inattentive. He perceives auditory hallucinations.        Mood and Affect: Affect is labile.        Speech: Speech is tangential.        Behavior: Behavior is cooperative.        Thought Content: Thought content is delusional. Thought content includes suicidal ideation.    Review of Systems  Unable to perform ROS: Psychiatric disorder   Blood pressure 124/74, pulse (!) 125, temperature 98.3 F (36.8 C), temperature source Oral, resp. rate (!) 22, height 5\' 6"  (1.676 m), weight 70 kg, SpO2 98 %. Body mass index is 24.91 kg/m.  Medical Decision Making: Pt reviewed and discussed with Dr. Lucianne Muss. Will recommend overnight observation with reassessment by psychiatry in the morning when patient is more sober/able to engage in assessment better. Pt will also be able to contact [facility] tomorrow at 0800 for possible return to residential program, due to his high risk for relapse he would benefit from door to door transfer.   *If patient requests discharge, I do not believe he meets criteria for IVC. Resources will be added into AVS in case he wants to leave AMA*  Patients medical clearance still  pending, UDS still pending  Disposition:  overnight obs with reevaluation by psychiatry in the morning  Eligha Bridegroom, NP 03/12/2023 5:29 PM

## 2023-03-12 NOTE — ED Notes (Signed)
Assisted PA into pt room. Pt was agitated but able to communicate with provider appropriately. Pt requested water and something to help with his leg pain from walking.

## 2023-03-12 NOTE — ED Triage Notes (Signed)
BIB [facility] from outside of [facility] for manic behaviors.  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 EMS reports patient stated that Jason Buchanan is nice and friendly and Torrion is not.Patient states Jason Buchanan fell down 8 stairs and now his chest and wrist hurt. He states Jason Buchanan does not do drugs but Rage does all kinds of "shit". On arrival to the ED patient is cooperative but very fidgety. EMS states that patient has a history of drug abuse, mainly stimulants and Schizophrenia.

## 2023-03-12 NOTE — ED Notes (Addendum)
Assumed care of this pt. Equal chest rise and fall noted without incident. Pt would not answer questions and was very agitated when messed with. I was able to obtain pts temp after multiple tries. Pt was left in be with bed rails up, asleep.

## 2023-03-12 NOTE — ED Notes (Signed)
Patient changed and belongings placed in locker #6

## 2023-03-12 NOTE — ED Notes (Signed)
Patient's behavior is very spastic and he is very anxious, staff has attempted to redirect the patient numerous times and he will follow directions briefly however he is unable to maintain appropriate behavior on the unit. Dr. Jodi Mourning notified of patient's behavior. Patient requiring PRN Geodon at this time.

## 2023-03-12 NOTE — ED Provider Notes (Signed)
[place] EMERGENCY DEPARTMENT AT [facility] Provider Note   CSN: [id number] Arrival date & time: 03/12/23  1601     History  Chief Complaint  Patient presents with   Manic Behavior    Jason Buchanan is a 37 y.o. male.  Patient was significant history of IV drug use, recent admission to the hospital last month, visit to the emergency department this morning presents after being found outside [place] with bizarre and manic behaviors.  Per report patient conversing under 2 identities, Jill Alexanders who is nice and friendly in my call who is not in who does drugs and bad things.  Patient has history of stimulant use, heroin abuse and per report schizophrenia.  Difficult getting details and patient due to mumbling and nonsensical discussion.       Home Medications Prior to Admission medications   Medication Sig Start Date End Date Taking? Authorizing Provider  buprenorphine-naloxone (SUBOXONE) 8-2 mg SUBL SL tablet Place 1 tablet under the tongue 2 (two) times daily for 7 days 02/05/23   Darlin Drop, DO  folic acid (FOLVITE) 1 MG tablet Take 1 tablet (1 mg total) by mouth daily. 02/06/23 05/07/23  Darlin Drop, DO  gabapentin (NEURONTIN) 300 MG capsule Take 1 capsule (300 mg total) by mouth 3 (three) times daily. 02/05/23 03/08/23  Darlin Drop, DO  hydrOXYzine (ATARAX) 10 MG tablet Take 1 tablet (10 mg total) by mouth every 6 (six) hours as needed for anxiety. 02/05/23   Darlin Drop, DO  naloxone [facility]) nasal spray 4 mg/0.1 mL Take as needed for Narcotic overdose. Patient not taking: Reported on 01/29/2023 12/12/22   Glyn Ade, MD  pantoprazole (PROTONIX) 40 MG tablet Take 1 tablet (40 mg total) by mouth daily. 01/18/22 01/29/23  Jerald Kief, MD  polyethylene glycol powder [facility]) 17 GM/SCOOP powder Take 17 g by mouth daily as needed (constipation). Patient taking differently: Take 17 g by mouth daily. 01/16/23   Shalhoub, Deno Lunger, MD  QUEtiapine (SEROQUEL XR) 300  MG 24 hr tablet Take 1 tablet (300 mg total) by mouth at bedtime. 01/17/22 01/29/23  Jerald Kief, MD  senna (SENOKOT) 8.6 MG TABS tablet Take 2 tablets (17.2 mg total) by mouth at bedtime. Patient not taking: Reported on 01/29/2023 01/16/23   Marinda Elk, MD  thiamine (VITAMIN B-1) 100 MG tablet Take 1 tablet (100 mg total) by mouth daily 02/06/23 05/17/23  Darlin Drop, DO      Allergies    Bee venom, Acetaminophen, Succinylcholine, Chlorhexidine, and Naproxen    Review of Systems   Review of Systems  Unable to perform ROS: Mental status change    Physical Exam Updated Vital Signs BP 124/74   Pulse (!) 125   Temp 98.3 F (36.8 C) (Oral)   Resp (!) 22   Ht 5\' 6"  (1.676 m)   Wt 70 kg   SpO2 98%   BMI 24.91 kg/m  Physical Exam Vitals and nursing note reviewed.  Constitutional:      General: He is not in acute distress.    Appearance: He is well-developed.  HENT:     Head: Normocephalic and atraumatic.     Mouth/Throat:     Mouth: Mucous membranes are dry.  Eyes:     General:        Right eye: No discharge.        Left eye: No discharge.     Conjunctiva/sclera: Conjunctivae normal.  Neck:  Trachea: No tracheal deviation.  Cardiovascular:     Rate and Rhythm: Regular rhythm. Tachycardia present.  Pulmonary:     Effort: Pulmonary effort is normal.     Breath sounds: Normal breath sounds.  Abdominal:     General: There is no distension.     Palpations: Abdomen is soft.     Tenderness: There is no abdominal tenderness. There is no guarding.  Musculoskeletal:        General: Swelling and tenderness present. Normal range of motion.     Cervical back: Normal range of motion and neck supple. No rigidity.  Skin:    General: Skin is warm.     Capillary Refill: Capillary refill takes less than 2 seconds.     Findings: No rash.     Comments: Patient has mild tenderness erythema left dorsum hand and wrist with flexion extension.  No significant joint effusion.   Neurological:     Mental Status: He is alert.     Comments: Patient moves all extremities equal bilateral.  Nonsensical discussion.  Patient will go from calm and kind to agitated fairly quickly.  Psychiatric:        Mood and Affect: Mood is anxious.        Behavior: Behavior is agitated.        Thought Content: Thought content does not include suicidal plan.        Cognition and Memory: Cognition is impaired.     Comments: Patient intermittently confused on exam, acute psychosis, mumbling, nonsensical, fidgity     ED Results / Procedures / Treatments   Labs (all labs ordered are listed, but only abnormal results are displayed) Labs Reviewed  COMPREHENSIVE METABOLIC PANEL - Abnormal; Notable for the following components:      Result Value   CO2 21 (*)    Glucose, Bld 113 (*)    Calcium 8.7 (*)    Total Protein 6.0 (*)    Albumin 3.4 (*)    AST 57 (*)    All other components within normal limits  CBC WITH DIFFERENTIAL/PLATELET - Abnormal; Notable for the following components:   RBC 3.82 (*)    Hemoglobin 10.8 (*)    HCT 32.7 (*)    Eosinophils Absolute 0.6 (*)    All other components within normal limits  SALICYLATE LEVEL - Abnormal; Notable for the following components:   Salicylate Lvl <7.0 (*)    All other components within normal limits  ACETAMINOPHEN LEVEL - Abnormal; Notable for the following components:   Acetaminophen (Tylenol), Serum <10 (*)    All other components within normal limits  ETHANOL  RAPID URINE DRUG SCREEN, HOSP PERFORMED  LACTIC ACID, PLASMA  LACTIC ACID, PLASMA  URINALYSIS, ROUTINE W REFLEX MICROSCOPIC  TSH    EKG None  Radiology DG Wrist Complete Left  Result Date: 03/12/2023 CLINICAL DATA:  pain EXAM: LEFT WRIST - COMPLETE 3+ VIEW COMPARISON:  03/12/2023, 01/28/2023 FINDINGS: 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 There is also arthropathy involvement of the  radiocarpal joint and distal radioulnar joint. No significant interval change by plain radiography. No acute osseous finding or fracture. No definite focal soft tissue abnormality. 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 No acute finding by plain radiography. Electronically Signed   By: Judie Petit.  Shick M.D.   On: 03/12/2023 18:03   DG Hand 2 View Left  Result Date: 03/12/2023 CLINICAL DATA:  Pain EXAM: LEFT HAND -  2 VIEW COMPARISON:  01/28/2023 FINDINGS: Normal alignment. No acute osseous finding or fracture. No subluxation or dislocation. Similar advanced degenerative arthropathy of the radiocarpal joint, distal radioulnar joint, and carpal bones with joint space loss, sclerosis and scattered lucencies. No focal soft tissue abnormality. IMPRESSION: Advanced degenerative arthropathy of the wrist, unchanged. No acute finding by plain radiography Electronically Signed   By: Judie Petit.  Shick M.D.   On: 03/12/2023 18:01   DG Ankle Complete Right  Result Date: 03/12/2023 CLINICAL DATA:  pain EXAM: RIGHT ANKLE - COMPLETE 3+ VIEW COMPARISON:  11/05/2015 FINDINGS: Remote fixation hardware of the right distal tibia and fibula. Healed fractures of the tibia and fibula. Stable hardware and alignment. No acute osseous finding or fracture. No soft tissue abnormality. IMPRESSION: Remote right distal tibia and fibula ORIF. No acute finding by plain radiography. Electronically Signed   By: Judie Petit.  Shick M.D.   On: 03/12/2023 17:56   CT ABDOMEN PELVIS W CONTRAST  Result Date: 03/11/2023 CLINICAL DATA:  Altered mental status and abdominal pain. EXAM: CT ABDOMEN AND PELVIS WITH CONTRAST TECHNIQUE: Multidetector CT imaging of the abdomen and pelvis was performed using the standard protocol following bolus administration of intravenous contrast. RADIATION DOSE REDUCTION: This exam was performed according to the departmental dose-optimization program which includes automated exposure control, adjustment of the mA  and/or kV according to patient size and/or use of iterative reconstruction technique. CONTRAST:  OMNIPAQUE IOHEXOL 300 MG/ML  SOLN COMPARISON:  July 14, 2022 FINDINGS: Lower chest: Mild atelectatic changes are seen along the posterior aspects of the bilateral lung bases. Hepatobiliary: A 2.2 cm x 1.1 cm x 1.4 cm well-defined focus of parenchymal low attenuation is seen within the anteromedial aspect of the right lobe of the liver. No gallstones, gallbladder wall thickening, or biliary dilatation. Pancreas: Unremarkable. No pancreatic ductal dilatation or surrounding inflammatory changes. Spleen: Normal in size without focal abnormality. Adrenals/Urinary Tract: Adrenal glands are unremarkable. Kidneys are normal, without obstructing renal calculi, focal lesion, or hydronephrosis. 3 mm and 8 mm nonobstructing renal calculi are seen within the lower pole of the right kidney. The urinary bladder is markedly distended and is otherwise unremarkable. Stomach/Bowel: Stomach is within normal limits. Appendix appears normal. Stool is seen throughout the large bowel. No evidence of bowel wall thickening, distention, or inflammatory changes. Vascular/Lymphatic: Aortic atherosclerosis. No enlarged abdominal or pelvic lymph nodes. Reproductive: The prostate gland is normal in size with moderate severity prostate gland calcification. Other: No abdominal wall hernia or abnormality. No abdominopelvic ascites. Musculoskeletal: A metallic density intramedullary rod is seen within the proximal left femur. IMPRESSION: 1. Findings likely consistent with a hepatic cyst versus hemangioma. Correlation with nonemergent hepatic ultrasound is recommended. 2. 3 mm and 8 mm nonobstructing right renal calculi. 3. Aortic atherosclerosis. 4. Prior open reduction and internal fixation of the proximal left femur. Aortic Atherosclerosis (ICD10-I70.0). Electronically Signed   By: Aram Candela M.D.   On: 03/11/2023 23:46   CT Head Wo  Contrast  Result Date: 03/11/2023 CLINICAL DATA:  Altered mental status and abdominal pain. EXAM: CT HEAD WITHOUT CONTRAST TECHNIQUE: Contiguous axial images were obtained from the base of the skull through the vertex without intravenous contrast. RADIATION DOSE REDUCTION: This exam was performed according to the departmental dose-optimization program which includes automated exposure control, adjustment of the mA and/or kV according to patient size and/or use of iterative reconstruction technique. COMPARISON:  November 06, 2017 FINDINGS: Brain: No evidence of acute infarction, hemorrhage, hydrocephalus, extra-axial collection or mass lesion/mass effect.  A 5 mm focus of chronic white matter low attenuation is seen within the basal ganglia on the left (axial CT image 17, CT series 2). This represents a new finding when compared to the prior study. Vascular: No hyperdense vessel or unexpected calcification. Skull: Normal. Negative for fracture or focal lesion. Sinuses/Orbits: No acute finding. Other: None. IMPRESSION: 1. No acute intracranial abnormality. 2. 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 MRI correlation is recommended, as this is an unexpected finding in a patient of this age. Electronically Signed   By: Aram Candelahaddeus  Houston M.D.   On: 03/11/2023 23:37    Procedures Procedures    Medications Ordered in ED Medications  QUEtiapine (SEROQUEL XR) 24 hr tablet 300 mg (has no administration in time range)  gabapentin (NEURONTIN) capsule 300 mg (300 mg Oral Given 03/12/23 1812)  ziprasidone (GEODON) injection 20 mg (has no administration in time range)  LORazepam (ATIVAN) tablet 2 mg (2 mg Oral Given 03/12/23 1812)    ED Course/ Medical Decision Making/ A&P                             Medical Decision Making Amount and/or Complexity of Data Reviewed Labs: ordered. Radiology: ordered.  Risk Prescription drug management.   Patient with significant  psychiatric and medical history often related to unfortunate drug use presents with clinical concern for acute psychosis/manic behavior with broad differential including secondary to drug use, infection, metabolic, dehydration, other.  Plan for blood work including lactate general blood tests, IV fluids, x-ray of areas of tenderness from reported fall.  Patient has elevated heart rate which may be due to acute psychosis, infection, fall, dehydration.  Plan for Toradol for pain and follow-up heart rate after IV fluids as well as medication for agitation.   Medical records reviewed and workup done this morning including general blood work, blood cultures sent, CT scan of the head did reveal concerns for possible basal ganglier stroke on known etiology/duration, patient not stable or calm enough to do MRI at this time.  Patient stable at this time, on reassessment comfortable talking with psychiatry.  Psychiatry recommended overnight observation as patient frequently improves significantly and is stable for discharge or to go to [facility]. Diet and medications ordered.  X-rays independently reviewed and arthritis/arthropathy no acute fracture.        Final Clinical Impression(s) / ED Diagnoses Final diagnoses:  Acute psychosis  Left hand pain  IVDU (intravenous drug user)    Rx / DC Orders ED Discharge Orders     None         Blane OharaZavitz, Kionte Baumgardner, MD 03/12/23 1830

## 2023-03-12 NOTE — ED Notes (Addendum)
Patient saying something about killing hisself tomorrow. he claim he is joking.Dr Jodi Mourning was made aware.

## 2023-03-13 ENCOUNTER — Emergency Department (EMERGENCY_DEPARTMENT_HOSPITAL)
Admission: EM | Admit: 2023-03-13 | Discharge: 2023-03-15 | Disposition: A | Discharge status: Other Acute Inpatient Hospital | Payer: Medicaid Other | Source: Home / Self Care | Attending: Emergency Medicine | Admitting: Emergency Medicine

## 2023-03-13 DIAGNOSIS — F39 Unspecified mood [affective] disorder: Secondary | ICD-10-CM | POA: Insufficient documentation

## 2023-03-13 DIAGNOSIS — J45909 Unspecified asthma, uncomplicated: Secondary | ICD-10-CM | POA: Insufficient documentation

## 2023-03-13 DIAGNOSIS — F17210 Nicotine dependence, cigarettes, uncomplicated: Secondary | ICD-10-CM | POA: Insufficient documentation

## 2023-03-13 DIAGNOSIS — F15951 Other stimulant use, unspecified with stimulant-induced psychotic disorder with hallucinations: Secondary | ICD-10-CM

## 2023-03-13 DIAGNOSIS — Z1152 Encounter for screening for COVID-19: Secondary | ICD-10-CM | POA: Insufficient documentation

## 2023-03-13 DIAGNOSIS — F1910 Other psychoactive substance abuse, uncomplicated: Secondary | ICD-10-CM | POA: Insufficient documentation

## 2023-03-13 MED ORDER — QUETIAPINE FUMARATE ER 300 MG PO TB24
300.0000 mg | ORAL_TABLET | Freq: Every day | ORAL | Status: DC
  Administered 2023-03-14 (×2): 300 mg via ORAL
  Filled 2023-03-13 (×3): qty 1

## 2023-03-13 MED ORDER — CLONIDINE HCL 0.1 MG PO TABS
0.1000 mg | ORAL_TABLET | Freq: Four times a day (QID) | ORAL | Status: DC
  Administered 2023-03-13 – 2023-03-15 (×7): 0.1 mg via ORAL
  Filled 2023-03-13 (×7): qty 1

## 2023-03-13 MED ORDER — LOPERAMIDE HCL 2 MG PO CAPS: 2.0000 mg | ORAL_CAPSULE | ORAL | Status: DC | PRN

## 2023-03-13 MED ORDER — METHOCARBAMOL 500 MG PO TABS: 500.0000 mg | ORAL_TABLET | Freq: Three times a day (TID) | ORAL | Status: DC | PRN

## 2023-03-13 MED ORDER — ONDANSETRON 4 MG PO TBDP: 4.0000 mg | ORAL_TABLET | Freq: Four times a day (QID) | ORAL | Status: DC | PRN

## 2023-03-13 MED ORDER — GABAPENTIN 300 MG PO CAPS
300.0000 mg | ORAL_CAPSULE | Freq: Three times a day (TID) | ORAL | Status: DC
  Administered 2023-03-14 – 2023-03-15 (×6): 300 mg via ORAL
  Filled 2023-03-13 (×6): qty 1

## 2023-03-13 MED ORDER — HYDROXYZINE HCL 25 MG PO TABS
25.0000 mg | ORAL_TABLET | Freq: Four times a day (QID) | ORAL | Status: DC | PRN
  Administered 2023-03-13 – 2023-03-15 (×5): 25 mg via ORAL
  Filled 2023-03-13 (×5): qty 1

## 2023-03-13 MED ORDER — DICYCLOMINE HCL 20 MG PO TABS: 20.0000 mg | ORAL_TABLET | Freq: Four times a day (QID) | ORAL | Status: DC | PRN

## 2023-03-13 MED ORDER — CLONIDINE HCL 0.1 MG PO TABS: 0.1000 mg | ORAL_TABLET | ORAL | Status: DC

## 2023-03-13 MED ORDER — CLONIDINE HCL 0.1 MG PO TABS: 0.1000 mg | ORAL_TABLET | Freq: Every day | ORAL | Status: DC

## 2023-03-13 MED ORDER — EPINEPHRINE 0.3 MG/0.3ML IJ SOAJ: 0.3000 mg | Freq: Once | INTRAMUSCULAR | Status: DC

## 2023-03-13 NOTE — ED Provider Notes (Signed)
[place] EMERGENCY DEPARTMENT AT [facility] Provider Note   CSN: [id number] Arrival date & time: 03/13/23  1525     History Chief Complaint  Patient presents with   Psychiatric Evaluation    HPI Jason Buchanan is a 37 y.o. male presenting for detox.  He states that he is a 37 year old male with a history of substance use disorder.  He is currently on buprenorphine.  Was seen at [facility] this morning referred to a substance clinic.  He arrived to the clinic was told that he could not be on his Suboxone while at this clinic and referred back to the emergency department for further care and management.  Denies any new symptoms.  Denies fevers chills nausea vomiting syncope shortness of breath, suicidality homicidality. Notably, patient was psychiatrically cleared today and referred to a SUD facility. Has undergone extensive evaluation this week for a myriad of complaints.  Patient's recorded medical, surgical, social, medication list and allergies were reviewed in the Snapshot window as part of the initial history.   Review of Systems   Review of Systems  Constitutional:  Negative for chills and fever.  HENT:  Negative for ear pain and sore throat.   Eyes:  Negative for pain and visual disturbance.  Respiratory:  Negative for cough and shortness of breath.   Cardiovascular:  Negative for chest pain and palpitations.  Gastrointestinal:  Negative for abdominal pain and vomiting.  Genitourinary:  Negative for dysuria and hematuria.  Musculoskeletal:  Negative for arthralgias and back pain.  Skin:  Negative for color change and rash.  Neurological:  Negative for seizures and syncope.  All other systems reviewed and are negative.   Physical Exam Updated Vital Signs BP (!) 162/134 (BP Location: Left Arm)   Pulse 100   Temp 98.2 F (36.8 C) (Oral)   Resp 18   SpO2 100%  Physical Exam Vitals and nursing note reviewed.  Constitutional:      General: He is not in acute  distress.    Appearance: He is well-developed.  HENT:     Head: Normocephalic and atraumatic.  Eyes:     Conjunctiva/sclera: Conjunctivae normal.  Cardiovascular:     Rate and Rhythm: Normal rate and regular rhythm.     Heart sounds: No murmur heard. Pulmonary:     Effort: Pulmonary effort is normal. No respiratory distress.     Breath sounds: Normal breath sounds.  Abdominal:     Palpations: Abdomen is soft.     Tenderness: There is no abdominal tenderness.  Musculoskeletal:        General: No swelling.     Cervical back: Neck supple.  Skin:    General: Skin is warm and dry.     Capillary Refill: Capillary refill takes less than 2 seconds.  Neurological:     Mental Status: He is alert.  Psychiatric:        Mood and Affect: Mood normal.      ED Course/ Medical Decision Making/ A&P    Procedures Procedures   Medications Ordered in ED Medications - No data to display  Medical Decision Making:   I discussed the case with psychiatry.  Patient had reported that it finished his Suboxone and was no longer taking it.  Given that he is still taking it, they were going to reevaluate for psychiatric placement. Patient hemodynamically stable no acute distress.  No evidence of acute infection abnormality delirium, metabolic derangement.  Evaluated thoroughly this week for his  behaviors, multiple complaints including extensive lab work, cross-sectional imaging all of which has been nondiagnostic to date.  Patient placed in a psychiatric hold protocol pending recommendations from psychiatry who will evaluate again at bedside for safe placement options for patient.   Clinical Impression:  1. Substance abuse      Data Unavailable   Final Clinical Impression(s) / ED Diagnoses Final diagnoses:  Substance abuse    Rx / DC Orders ED Discharge Orders     None         Glyn Ade, MD 03/13/23 1637

## 2023-03-13 NOTE — Discharge Summary (Signed)
[facility] Psych ED Discharge  03/13/2023 10:13 AM Jason Buchanan  MRN:  [id number]  Principal Problem: Amphetamine and psychostimulant-induced psychosis with hallucinations Discharge Diagnoses: Principal Problem:   Amphetamine and psychostimulant-induced psychosis with hallucinations Active Problems:   Polysubstance abuse  Clinical Impression:  Final diagnoses:  Acute psychosis  Left hand pain  IVDU (intravenous drug user)   Subjective:  Patient seen at [facility] for psychiatric reevaluation. Today, patient appears to have more intact thought content, engaged well in assessment, goal directed, and does not mention double personalities. Pt is still requesting to return to [facility] for residential treatment. I was able to call and speak with Chanetta Marshall the intake coordinator, who stated he left the program on March 20th, and due to leaving early he has to wait 30 days before returning which would be April 17th or later. Pt stated if he leaves the hospital he will relapse on drugs, and states he is very motivated and adamant about remaining clean. I then spoke with [facility] who agreed to do intake call with patient and having open availability at the [place] location. Pt called and per nursing was accepted to program. He has another call at 1130 am to get required info on time and location for arrival today.   Pt denies SI/HI. He denies auditory or visual hallucinations. He does appear to have good improvement today vs. Arrival yesterday. Pt continues to state he did not use drugs yesterday, however UDS resulted for positive amphetamines and THC. Pt is able to contract for safety at this time. ED team updated about disposition, and I have requested transportation be provided to facilitate door to door transfer to [facility].   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, polysubstance abuse, MDD  Past Medical History:  Past Medical History:  Diagnosis Date   Asthma    Mood disorder    Polysubstance abuse    Schizophrenia     Past Surgical History:  Procedure Laterality Date   ANKLE SURGERY     APPLICATION OF WOUND VAC Right 11/22/2021   Procedure: APPLICATION OF WOUND VAC;  Surgeon: Knute Neu, MD;  Location: [facility];  Service: Plastics;  Laterality: Right;   APPLICATION OF WOUND VAC Right 11/25/2021   Procedure: APPLICATION OF WOUND VAC;  Surgeon: Knute Neu, MD;  Location: [facility];  Service: Plastics;  Laterality: Right;   APPLICATION OF WOUND VAC Right 12/07/2021   Procedure: APPLICATION OF WOUND VAC;  Surgeon: Knute Neu, MD;  Location: [facility];  Service: Plastics;  Laterality: Right;   APPLICATION OF WOUND VAC Right 12/14/2021   Procedure: APPLICATION OF WOUND VAC;  Surgeon: [name], MD;  [facility]: [facility];  Service: Plastics;  Laterality: Right;   APPLICATION OF WOUND VAC Right 12/22/2021   Procedure: WOUND VAC CHANGE;  Surgeon: [name], MD;  [facility]: [facility];  Service: Plastics;  Laterality: Right;   APPLICATION OF WOUND VAC Right 12/29/2021   Procedure: APPLICATION OF WOUND VAC;  Surgeon: [name], MD;  [facility]: [facility];  Service: Plastics;  Laterality: Right;   APPLICATION OF WOUND VAC Right 01/03/2022   Procedure: WOUND VAC APPLICATION;  Surgeon: [name], MD;  [facility]: [facility];  Service: Plastics;  Laterality: Right;   BIOPSY  11/17/2021   Procedure: BIOPSY;  Surgeon: Vida Rigger, MD;  [facility]: [facility];  Service: Endoscopy;;  BUBBLE STUDY  09/30/2021   Procedure: BUBBLE STUDY;  Surgeon: Orpah CobbKadakia, Ajay, MD;  [facility];  Service: Cardiovascular;;   DEBRIDEMENT AND CLOSURE WOUND Right 01/03/2022   Procedure: DEBRIDEMENT AND COMPLEX CLOSURE OF RIGHT ARM WOUND;  Surgeon: Janne NapoleonLuppens, Daniel, MD;  Location: [facility];  Service: Plastics;  Laterality: Right;    ESOPHAGOGASTRODUODENOSCOPY (EGD) WITH PROPOFOL N/A 11/17/2021   Procedure: ESOPHAGOGASTRODUODENOSCOPY (EGD) WITH PROPOFOL;  Surgeon: Vida RiggerMagod, Marc, MD;  [facility]: [facility];  Service: Endoscopy;  Laterality: N/A;   FRACTURE SURGERY     I & D EXTREMITY Right 09/27/2021   Procedure: IRRIGATION AND DEBRIDEMENT OF ABSCESS RIGHT HAND;  Surgeon: Knute Neuoley, Harrill, MD;  Location: [facility];  Service: Plastics;  Laterality: Right;   I & D EXTREMITY Bilateral 11/15/2021   Procedure: IRRIGATION AND DEBRIDEMENT EXTREMITY;  Surgeon: Knute Neuoley, Harrill, MD;  Location: [facility];  Service: Plastics;  Laterality: Bilateral;   I & D EXTREMITY Right 11/17/2021   Procedure: IRRIGATION AND DEBRIDEMENT EXTREMITY;  Surgeon: Knute Neuoley, Harrill, MD;  Location: [facility];  Service: Plastics;  Laterality: Right;   I & D EXTREMITY Bilateral 12/01/2021   Procedure: INCISION AND DRAINAGE BILATERAL UPPER EXTREMITIES WITH  WOUND VAC CHANGE RIGHT;  Surgeon: Gomez CleverlySpears, James, MD;  Location: [facility];  Service: Orthopedics;  Laterality: Bilateral;   I & D EXTREMITY Right 12/07/2021   Procedure: IRRIGATION AND DEBRIDEMENT EXTREMITY, MANIPULATION OF FINGERS UNDER ANESTHESIA;  Surgeon: Knute Neuoley, Harrill, MD;  [facility]: [facility];  Service: Plastics;  Laterality: Right;   I & D EXTREMITY Left 01/13/2023   Procedure: IRRIGATION AND DEBRIDEMENT EXTREMITY;  Surgeon: Bradly Bienenstockrtmann, Fred, MD;  Location: [facility];  Service: Orthopedics;  Laterality: Left;   INCISION AND DRAINAGE OF WOUND Left 09/27/2021   Procedure: IRRIGATION AND DEBRIDEMENT OF ABSCESS LEFT INDEX FINGER;  Surgeon: Knute Neuoley, Harrill, MD;  Location: [facility];  Service: Plastics;  Laterality: Left;   INCISION AND DRAINAGE OF WOUND Bilateral 11/22/2021   Procedure: IRRIGATION AND DEBRIDEMENT WOUND;  Surgeon: Knute Neuoley, Harrill, MD;  [facility]: [facility];  Service: Plastics;  Laterality: Bilateral;   INCISION AND DRAINAGE OF WOUND Bilateral 11/25/2021   Procedure: BILATERAL IRRIGATION AND DEBRIDEMENT BILATERAL ARMS;  Surgeon:  Knute Neuoley, Harrill, MD;  [facility]: [facility];  Service: Plastics;  Laterality: Bilateral;   INCISION AND DRAINAGE OF WOUND Right 12/14/2021   Procedure: IRRIGATION AND DEBRIDEMENT RIGHT ARM;  Surgeon: Janne NapoleonLuppens, Daniel, MD;  [facility]: [facility];  Service: Plastics;  Laterality: Right;   INCISION AND DRAINAGE OF WOUND Right 12/22/2021   Procedure: IRRIGATION AND DEBRIDEMENT ARM  WOUND;  Surgeon: Janne NapoleonLuppens, Daniel, MD;  Location: [facility];  Service: Plastics;  Laterality: Right;  1 hour   INCISION AND DRAINAGE OF WOUND Right 12/29/2021   Procedure: DEBRIDEMENT RIGHT ARM  WOUND;  Surgeon: Janne NapoleonLuppens, Daniel, MD;  Location: [facility];  Service: Plastics;  Laterality: Right;   JOINT REPLACEMENT     SCLEROTHERAPY  11/17/2021   Procedure: Susa DaySCLEROTHERAPY;  Surgeon: Vida RiggerMagod, Marc, MD;  [facility];  Service: Endoscopy;;   SKIN SPLIT GRAFT Right 01/03/2022   Procedure: SKIN GRAFT SPLIT THICKNESS;  Surgeon: Janne NapoleonLuppens, Daniel, MD;  [facility]: [facility];  Service: Plastics;  Laterality: Right;   TEE WITHOUT CARDIOVERSION N/A 09/30/2021   Procedure: TRANSESOPHAGEAL ECHOCARDIOGRAM (TEE);  Surgeon: Orpah CobbKadakia, Ajay, MD;  [facility];  Service: Cardiovascular;  Laterality: N/A;   Family History: History reviewed. No pertinent family history. Social History:  Social History   Substance and Sexual Activity  Alcohol Use Not Currently  Comment: occasionally     Social History   Substance and Sexual Activity  Drug Use Yes   Types: IV, Marijuana   Comment: heroin, meth    Social History   Socioeconomic History   Marital status: Single    Spouse name: Not on file   Number of children: Not on file   Years of education: Not on file   Highest education level: Not on file  Occupational History   Not on file  Tobacco Use   Smoking status: Some Days    Packs/day: .5    Types: Cigarettes   Smokeless tobacco: Never  Substance and Sexual Activity   Alcohol use: Not Currently    Comment: occasionally   Drug use: Yes     Types: IV, Marijuana    Comment: heroin, meth   Sexual activity: Not on file  Other Topics Concern   Not on file  Social History Narrative   ** Merged History Encounter **       Social Determinants of Health   Financial Resource Strain: Not on file  Food Insecurity: No Food Insecurity (01/13/2023)   Hunger Vital Sign    Worried About Running Out of Food in the Last Year: Never true    Ran Out of Food in the Last Year: Never true  Transportation Needs: Unmet Transportation Needs (01/13/2023)   PRAPARE - Administrator, Civil Service (Medical): Yes    Lack of Transportation (Non-Medical): Yes  Physical Activity: Not on file  Stress: Not on file  Social Connections: Not on file    Tobacco Cessation:  A prescription for an FDA-approved tobacco cessation medication was offered at discharge and the patient refused  Current Medications: Current Facility-Administered Medications  Medication Dose Route Frequency Provider Last Rate Last Admin   gabapentin (NEURONTIN) capsule 300 mg  300 mg Oral TID Eligha Bridegroom, NP   300 mg at 03/12/23 1812   hydrOXYzine (ATARAX) tablet 50 mg  50 mg Oral Q6H PRN Blane Ohara, MD       QUEtiapine (SEROQUEL XR) 24 hr tablet 300 mg  300 mg Oral QHS Eligha Bridegroom, NP       ziprasidone (GEODON) injection 20 mg  20 mg Intramuscular Q12H PRN Eligha Bridegroom, NP   20 mg at 03/12/23 1959   Current Outpatient Medications  Medication Sig Dispense Refill   buprenorphine-naloxone (SUBOXONE) 8-2 mg SUBL SL tablet Place 1 tablet under the tongue 2 (two) times daily for 7 days 14 tablet 0   folic acid (FOLVITE) 1 MG tablet Take 1 tablet (1 mg total) by mouth daily. 90 tablet 0   gabapentin (NEURONTIN) 300 MG capsule Take 1 capsule (300 mg total) by mouth 3 (three) times daily. 90 capsule 0   hydrOXYzine (ATARAX) 10 MG tablet Take 1 tablet (10 mg total) by mouth every 6 (six) hours as needed for anxiety. 30 tablet 0   naloxone (NARCAN) nasal  spray 4 mg/0.1 mL Take as needed for Narcotic overdose. (Patient not taking: Reported on 01/29/2023) 1 each 0   pantoprazole (PROTONIX) 40 MG tablet Take 1 tablet (40 mg total) by mouth daily. 30 tablet 0   polyethylene glycol powder (MIRALAX) 17 GM/SCOOP powder Take 17 g by mouth daily as needed (constipation). (Patient taking differently: Take 17 g by mouth daily.) 255 g 0   QUEtiapine (SEROQUEL XR) 300 MG 24 hr tablet Take 1 tablet (300 mg total) by mouth at bedtime. 30 tablet 0   senna (SENOKOT) 8.6  MG TABS tablet Take 2 tablets (17.2 mg total) by mouth at bedtime. (Patient not taking: Reported on 01/29/2023) 120 tablet 0   thiamine (VITAMIN B-1) 100 MG tablet Take 1 tablet (100 mg total) by mouth daily 100 tablet 3   PTA Medications: (Not in a hospital admission)   [place] Scale:  Flowsheet Row ED from 03/12/20[facility] in [facility] Department at [facility] ED from 03/11/2023 in [facility] Emergency Department at [facility] ED to Hosp-Admission (Discharged) from 01/28/2023 in [facility] EAST MEDICAL UNIT  C-SSRS RISK CATEGORY No Risk No Risk No Risk       Psychiatric Specialty Exam: Presentation  General Appearance:  Fairly Groomed  Eye Contact: Fair  Speech: Garbled  Speech Volume: Normal  Handedness:No data recorded  Mood and Affect  Mood: Anxious  Affect: Congruent   Thought Process  Thought Processes: Goal Directed  Descriptions of Associations:Circumstantial  Orientation:Full (Time, Place and Person)  Thought Content:Logical  History of Schizophrenia/Schizoaffective disorder:No data recorded Duration of Psychotic Symptoms:No data recorded Hallucinations:Hallucinations: None  Ideas of Reference:None  Suicidal Thoughts:Suicidal Thoughts: No SI Passive Intent and/or Plan: Without Intent; Without Plan  Homicidal Thoughts:Homicidal Thoughts: No   Sensorium  Memory: Immediate Fair; Recent  Fair  Judgment: Fair  Insight: Fair   Chartered certified accountant: Fair  Attention Span: Fair  Recall: [employer] of Knowledge: Fair  Language: Fair   Psychomotor Activity  Psychomotor Activity: Psychomotor Activity: Restlessness   Assets  Assets: Resilience; Physical Health; Desire for Improvement   Sleep  Sleep: Sleep: Fair    Physical Exam: Physical Exam Neurological:     Mental Status: He is alert and oriented to person, place, and time.  Psychiatric:        Attention and Perception: Attention normal.        Mood and Affect: Mood is anxious.        Speech: Speech normal.        Behavior: Behavior is cooperative.        Thought Content: Thought content normal.    Review of Systems  Psychiatric/Behavioral:  Positive for depression and substance abuse.   All other systems reviewed and are negative.  Blood pressure 122/78, pulse 93, temperature 98 F (36.7 C), temperature source Oral, resp. rate 16, height 5\' 6"  (1.676 m), weight 70 kg, SpO2 98 %. Body mass index is 24.91 kg/m.   Demographic Factors:  Male, Caucasian, Living alone, and Unemployed  Loss Factors: Decline in physical health and Legal issues  Historical Factors: Impulsivity  Risk Reduction Factors:   Religious beliefs about death, Positive social support, and Positive therapeutic relationship  Continued Clinical Symptoms:  Alcohol/Substance Abuse/Dependencies Previous Psychiatric Diagnoses and Treatments  Cognitive Features That Contribute To Risk:  None    Suicide Risk:  Mild:  Suicidal ideation of limited frequency, intensity, duration, and specificity.  There are no identifiable plans, no associated intent, mild dysphoria and related symptoms, good self-control (both objective and subjective assessment), few other risk factors, and identifiable protective factors, including available and accessible social support.    Plan Of Care/Follow-up recommendations:   Other:  Please follow up with psychiatric OP care  Medical Decision Making: Pt case reviewed and discussed with Dr. Lucianne Muss. Pt is requesting substance abuse treatment with a door to door transfer. [facility] [place] has accepted patient for today after 1130. ED staff aware of need for cab to take him directly to facility.   -  Resources provided in AVS for shelters, mental health crisis, OP follow up, therapy, and substance abuse  Disposition: psych cleared, pt will go to [facility] today via hospital provided transportation  [place], NP 03/13/2023, 10:13 AM

## 2023-03-13 NOTE — Consult Note (Signed)
[facility] ED ASSESSMENT    Reason for Consult:  Eval. For SUD placement. Referring Physician:  Dr. Doran Durand  Patient Identification: Jason Buchanan MRN:  [id number] ED Chief Complaint: Mood disorder  Diagnosis:  Principal Problem:   Mood disorder Active Problems:   Polysubstance abuse   ED Assessment Time Calculation: Start Time: 1700 Stop Time: 1730 Total Time in Minutes (Assessment Completion): 30    HPI: Per Triage Note: Pt states "I don't feel safe here because there is people looking for me because I owe them money. I am hearing voices and my heart is about to explode. I am freaking out. I don't know what is going on" Hx IV drug use.    Subjective: Jason Buchanan, 37 y.o., male patient seen face to face by this provider, consulted with Dr. Lucianne Muss; and chart reviewed on 03/13/23.  On evaluation Jason Buchanan reports that he is here to detox from Suboxone, so that he can go to [facility] facility.  Patient denies SI/HI/AVH, states he just needs to be here so he can detox from Suboxone, says he cannot go home because he knows that he will go home he will relapse.  Patient states he has approved this to himself because if he does not, he feels like a failure and unsuccessful.  During evaluation today, the patient is laying in bed, in no acute distress. He is calm and cooperative during this assessment. He is disheveled.  He is eye contact is fair.  Speech is difficult to understand at times, due to patient mumbling, to self.  Speech is pressured, with normal pace and normal volume.  He reports his mood as "good ". Affect is congruent with mood.  Thought process is coherent.  Thought content is slightly scattered.  He currently denies auditory and visual hallucinations. Endorses chronic AH, voices worsen with drug use.   No indication that he is responding to internal stimuli during this assessment.  No delusions elicited during this assessment. He denies suicidal ideations.  Patient  states heroin is his drug of choice, denies using any alcohol currently.  Patient UDS positive for THC and amphetamines, BAL less than 10. Patient wants to go to a sober living house for detox.  Patient is homeless, he does have history of frequent hospital visitations/malingering concerns.   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 Patient denies  Has the patient been a risk to self in the past 6 months? Patient denies  Has the patient been a risk to self within the distant past? Patient denies  Is the patient a risk to others? Patient denies  Has the patient been a risk to others in the past 6 months? Patient denies  Has the patient been a risk to others within the distant past? Patient denies   [place] Scale:  Flowsheet Row ED from 03/13/2023 in [facility] Emergency Department at [facility] ED from 03/12/2023 in [facility] Emergency Department at [facility] ED from 03/11/2023 in [facility] Emergency Department at [facility]  C-SSRS RISK CATEGORY No Risk No Risk No Risk       AIMS:  , , ,  ,   ASAM:    Substance Abuse:     Past Medical History:  Past Medical History:  Diagnosis Date   Asthma    Mood disorder  Polysubstance abuse    Schizophrenia     Past Surgical History:  Procedure Laterality Date   ANKLE SURGERY     APPLICATION OF WOUND VAC Right 11/22/2021   Procedure: APPLICATION OF WOUND VAC;  Surgeon: Knute Neu, MD;  Location: [facility];  Service: Plastics;  Laterality: Right;   APPLICATION OF WOUND VAC Right 11/25/2021   Procedure: APPLICATION OF WOUND VAC;  Surgeon: Knute Neu, MD;  [facility];  Service: Plastics;  Laterality: Right;   APPLICATION OF WOUND VAC Right 12/07/2021   Procedure: APPLICATION OF WOUND VAC;  Surgeon: Knute Neu, MD;  [facility];  Service: Plastics;   Laterality: Right;   APPLICATION OF WOUND VAC Right 12/14/2021   Procedure: APPLICATION OF WOUND VAC;  Surgeon: [name], MD;  [facility];  Service: Plastics;  Laterality: Right;   APPLICATION OF WOUND VAC Right 12/22/2021   Procedure: WOUND VAC CHANGE;  Surgeon: [name], MD;  [facility];  Service: Plastics;  Laterality: Right;   APPLICATION OF WOUND VAC Right 12/29/2021   Procedure: APPLICATION OF WOUND VAC;  Surgeon: [name], MD;  [facility];  Service: Plastics;  Laterality: Right;   APPLICATION OF WOUND VAC Right 01/03/2022   Procedure: WOUND VAC APPLICATION;  Surgeon: [name], MD;  [facility];  Service: Plastics;  Laterality: Right;   BIOPSY  11/17/2021   Procedure: BIOPSY;  Surgeon: Vida Rigger, MD;  [facility];  Service: Endoscopy;;   BUBBLE STUDY  09/30/2021   Procedure: BUBBLE STUDY;  Surgeon: Orpah Cobb, MD;  Location: [facility] ENDOSCOPY;  Service: Cardiovascular;;   DEBRIDEMENT AND CLOSURE WOUND Right 01/03/2022   Procedure: DEBRIDEMENT AND COMPLEX CLOSURE OF RIGHT ARM WOUND;  Surgeon: [name], MD;  [facility];  Service: Plastics;  Laterality: Right;   ESOPHAGOGASTRODUODENOSCOPY (EGD) WITH PROPOFOL N/A 11/17/2021   Procedure: ESOPHAGOGASTRODUODENOSCOPY (EGD) WITH PROPOFOL;  Surgeon: Vida Rigger, MD;  [facility];  Service: Endoscopy;  Laterality: N/A;   FRACTURE SURGERY     I & D EXTREMITY Right 09/27/2021   Procedure: IRRIGATION AND DEBRIDEMENT OF ABSCESS RIGHT HAND;  Surgeon: Knute Neu, MD;  Location: [facility];  Service: Plastics;  Laterality: Right;   I & D EXTREMITY Bilateral 11/15/2021   Procedure: IRRIGATION AND DEBRIDEMENT EXTREMITY;  Surgeon: Knute Neu, MD;  [facility];  Service: Plastics;  Laterality: Bilateral;   I & D EXTREMITY Right 11/17/2021   Procedure: IRRIGATION AND DEBRIDEMENT EXTREMITY;  Surgeon: Knute Neu, MD;  [facility];  Service: Plastics;  Laterality:  Right;   I & D EXTREMITY Bilateral 12/01/2021   Procedure: INCISION AND DRAINAGE BILATERAL UPPER EXTREMITIES WITH  WOUND VAC CHANGE RIGHT;  Surgeon: Gomez Cleverly, MD;  [facility];  Service: Orthopedics;  Laterality: Bilateral;   I & D EXTREMITY Right 12/07/2021   Procedure: IRRIGATION AND DEBRIDEMENT EXTREMITY, MANIPULATION OF FINGERS UNDER ANESTHESIA;  Surgeon: Knute Neu, MD;  [facility];  Service: Plastics;  Laterality: Right;   I & D EXTREMITY Left 01/13/2023   Procedure: IRRIGATION AND DEBRIDEMENT EXTREMITY;  Surgeon: Bradly Bienenstock, MD;  [facility];  Service: Orthopedics;  Laterality: Left;   INCISION AND DRAINAGE OF WOUND Left 09/27/2021   Procedure: IRRIGATION AND DEBRIDEMENT OF ABSCESS LEFT INDEX FINGER;  Surgeon: Knute Neu, MD;  [facility]: [facility];  Service: Plastics;  Laterality: Left;   INCISION AND DRAINAGE OF WOUND Bilateral 11/22/2021   Procedure: IRRIGATION AND DEBRIDEMENT WOUND;  Surgeon: Knute Neu, MD;  [facility];  Service: Plastics;  Laterality: Bilateral;   INCISION AND DRAINAGE OF WOUND Bilateral 11/25/2021   Procedure: BILATERAL IRRIGATION AND DEBRIDEMENT BILATERAL ARMS;  Surgeon: Knute Neu, MD;  [facility];  Service: Plastics;  Laterality: Bilateral;   INCISION AND DRAINAGE OF WOUND Right 12/14/2021   Procedure: IRRIGATION AND DEBRIDEMENT RIGHT ARM;  Surgeon: [name], MD;  [facility];  Service: Plastics;  Laterality: Right;   INCISION AND DRAINAGE OF WOUND Right 12/22/2021   Procedure: IRRIGATION AND DEBRIDEMENT ARM  WOUND;  Surgeon: [name], MD;  [facility];  Service: Plastics;  Laterality: Right;  1 hour   INCISION AND DRAINAGE OF WOUND Right 12/29/2021   Procedure: DEBRIDEMENT RIGHT ARM  WOUND;  Surgeon: [name], MD;  [facility];  Service: Plastics;  Laterality: Right;   JOINT REPLACEMENT     SCLEROTHERAPY  11/17/2021   Procedure: Susa Day;  Surgeon: Vida Rigger, MD;  [facility];  Service: Endoscopy;;   SKIN SPLIT GRAFT Right 01/03/2022   Procedure: SKIN GRAFT SPLIT THICKNESS;  Surgeon: [name], MD;  [facility];  Service: Plastics;  Laterality: Right;   TEE WITHOUT CARDIOVERSION N/A 09/30/2021   Procedure: TRANSESOPHAGEAL ECHOCARDIOGRAM (TEE);  Surgeon: Orpah Cobb, MD;  Location: [facility] ENDOSCOPY;  Service: Cardiovascular;  Laterality: N/A;   Family History: No family history on file.  Social History:  Social History   Substance and Sexual Activity  Alcohol Use Not Currently   Comment: occasionally     Social History   Substance and Sexual Activity  Drug Use Yes   Types: IV, Marijuana   Comment: heroin, meth    Social History   Socioeconomic History   Marital status: Single    Spouse name: Not on file   Number of children: Not on file   Years of education: Not on file   Highest education level: Not on file  Occupational History   Not on file  Tobacco Use   Smoking status: Some Days    Packs/day: .5    Types: Cigarettes   Smokeless tobacco: Never  Substance and Sexual Activity   Alcohol use: Not Currently    Comment: occasionally   Drug use: Yes    Types: IV, Marijuana    Comment: heroin, meth   Sexual activity: Not on file  Other Topics Concern   Not on file  Social History Narrative   ** Merged History Encounter **       Social Determinants of Health   Financial Resource Strain: Not on file  Food Insecurity: No Food Insecurity (01/13/2023)   Hunger Vital Sign    Worried About Running Out of Food in the Last Year: Never true    Ran Out of Food in the Last Year: Never true  Transportation Needs: Unmet Transportation Needs (01/13/2023)   PRAPARE - Administrator, Civil Service (Medical): Yes    Lack of Transportation (Non-Medical): Yes  Physical Activity: Not on file  Stress: Not on file  Social Connections: Not on file      Allergies:   Allergies  Allergen Reactions   Bee Venom Anaphylaxis,  Swelling and Other (See Comments)    Swelling all over   Acetaminophen Rash and Other (See Comments)    Elevated liver enzymes, also   Succinylcholine Other (See Comments)    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 Had prolonged block (>30 min) after succinylcholine for GI procedure.    Chlorhexidine Other (See Comments)  Pt does not remember reaction    Naproxen Other (See Comments)    Stomach upset    Labs:  Results for orders placed or performed during the hospital encounter of 03/12/23 (from the past 48 hour(s))  Urine rapid drug screen (hosp performed)     Status: Abnormal   Collection Time: 03/12/23  4:28 PM  Result Value Ref Range   Opiates NONE DETECTED NONE DETECTED   Cocaine NONE DETECTED NONE DETECTED   Benzodiazepines NONE DETECTED NONE DETECTED   Amphetamines POSITIVE (A) NONE DETECTED   Tetrahydrocannabinol POSITIVE (A) NONE DETECTED   Barbiturates NONE DETECTED NONE DETECTED    Comment: (NOTE) DRUG SCREEN FOR MEDICAL PURPOSES ONLY.  IF CONFIRMATION IS NEEDED FOR ANY PURPOSE, NOTIFY LAB WITHIN 5 DAYS.  LOWEST DETECTABLE LIMITS FOR URINE DRUG SCREEN Drug Class                     Cutoff (ng/mL) Amphetamine and metabolites    1000 Barbiturate and metabolites    200 Benzodiazepine                 200 Opiates and metabolites        300 Cocaine and metabolites        300 THC                            50 Performed at [facility], [address] [place], [place] [address]   Comprehensive metabolic panel     Status: Abnormal   Collection Time: 03/12/23  4:40 PM  Result Value Ref Range   Sodium 139 135 - 145 mmol/L   Potassium 3.8 3.5 - 5.1 mmol/L   Chloride 110 98 - 111 mmol/L   CO2 21 (L) 22 - 32 mmol/L   Glucose, Bld 113 (H) 70 - 99 mg/dL    Comment: Glucose reference range applies only to samples taken after fasting for at least 8 hours.   BUN 14 6 - 20 mg/dL   Creatinine, Ser 6.04 0.61 - 1.24 mg/dL   Calcium 8.7 (L) 8.9 - 10.3 mg/dL   Total  Protein 6.0 (L) 6.5 - 8.1 g/dL   Albumin 3.4 (L) 3.5 - 5.0 g/dL   AST 57 (H) 15 - 41 U/L   ALT 42 0 - 44 U/L   Alkaline Phosphatase 112 38 - 126 U/L   Total Bilirubin 0.5 0.3 - 1.2 mg/dL   GFR, Estimated >54 >09 mL/min    Comment: (NOTE) Calculated using the CKD-EPI Creatinine Equation (2021)    Anion gap 8 5 - 15    Comment: Performed at [facility], [address] [place], [place] [address]  Ethanol     Status: None   Collection Time: 03/12/23  4:40 PM  Result Value Ref Range   Alcohol, Ethyl (B) <10 <10 mg/dL    Comment: (NOTE) Lowest detectable limit for serum alcohol is 10 mg/dL.  For medical purposes only. Performed at [facility], [address], [place], [place] [address]   CBC with Diff     Status: Abnormal   Collection Time: 03/12/23  4:40 PM  Result Value Ref Range   WBC 7.4 4.0 - 10.5 K/uL   RBC 3.82 (L) 4.22 - 5.81 MIL/uL   Hemoglobin 10.8 (L) 13.0 - 17.0 g/dL   HCT 56.2 (L) 13.0 - 86.5 %   MCV 85.6 80.0 - 100.0 fL  MCH 28.3 26.0 - 34.0 pg   MCHC 33.0 30.0 - 36.0 g/dL   RDW 10.9 32.3 - 55.7 %   Platelets 353 150 - 400 K/uL   nRBC 0.0 0.0 - 0.2 %   Neutrophils Relative % 66 %   Neutro Abs 4.9 1.7 - 7.7 K/uL   Lymphocytes Relative 19 %   Lymphs Abs 1.4 0.7 - 4.0 K/uL   Monocytes Relative 7 %   Monocytes Absolute 0.5 0.1 - 1.0 K/uL   Eosinophils Relative 7 %   Eosinophils Absolute 0.6 (H) 0.0 - 0.5 K/uL   Basophils Relative 1 %   Basophils Absolute 0.1 0.0 - 0.1 K/uL   Immature Granulocytes 0 %   Abs Immature Granulocytes 0.01 0.00 - 0.07 K/uL    Comment: Performed at [facility], [address], [place], [place] [address]  Salicylate level     Status: Abnormal   Collection Time: 03/12/23  4:40 PM  Result Value Ref Range   Salicylate Lvl <7.0 (L) 7.0 - 30.0 mg/dL    Comment: Performed at [facility], [address], [place], [place] [address]  Acetaminophen level     Status: Abnormal   Collection Time: 03/12/23  4:40 PM   Result Value Ref Range   Acetaminophen (Tylenol), Serum <10 (L) 10 - 30 ug/mL    Comment: (NOTE) Therapeutic concentrations vary significantly. A range of 10-30 ug/mL  may be an effective concentration for many patients. However, some  are best treated at concentrations outside of this range. Acetaminophen concentrations >150 ug/mL at 4 hours after ingestion  and >50 ug/mL at 12 hours after ingestion are often associated with  toxic reactions.  Performed at [facility], [address], [place], [place] [address]   Urinalysis, Routine w reflex microscopic -Urine, Clean Catch     Status: Abnormal   Collection Time: 03/12/23  5:16 PM  Result Value Ref Range   Color, Urine YELLOW YELLOW   APPearance HAZY (A) CLEAR   Specific Gravity, Urine 1.027 1.005 - 1.030   pH 5.0 5.0 - 8.0   Glucose, UA NEGATIVE NEGATIVE mg/dL   Hgb urine dipstick MODERATE (A) NEGATIVE   Bilirubin Urine SMALL (A) NEGATIVE   Ketones, ur 5 (A) NEGATIVE mg/dL   Protein, ur 30 (A) NEGATIVE mg/dL   Nitrite NEGATIVE NEGATIVE   Leukocytes,Ua SMALL (A) NEGATIVE   RBC / HPF >50 0 - 5 RBC/hpf   WBC, UA 21-50 0 - 5 WBC/hpf   Bacteria, UA MANY (A) NONE SEEN   Squamous Epithelial / HPF 0-5 0 - 5 /HPF   Mucus PRESENT    Ca Oxalate Crys, UA PRESENT     Comment: Performed at [facility], [address], [place], [place] [address]  Lactic acid, plasma     Status: None   Collection Time: 03/12/23  8:04 PM  Result Value Ref Range   Lactic Acid, Venous 1.5 0.5 - 1.9 mmol/L    Comment: Performed at [facility], [address], [place], [place] [address]    No current facility-administered medications for this encounter.   Current Outpatient Medications  Medication Sig Dispense Refill   buprenorphine-naloxone (SUBOXONE) 8-2 mg SUBL SL tablet Place 1 tablet under the tongue 2 (two) times daily for 7 days 14 tablet 0   folic acid (FOLVITE) 1 MG tablet Take 1 tablet (1 mg total) by mouth daily. 90 tablet  0   gabapentin (NEURONTIN) 300  MG capsule Take 1 capsule (300 mg total) by mouth 3 (three) times daily. 90 capsule 0   hydrOXYzine (ATARAX) 10 MG tablet Take 1 tablet (10 mg total) by mouth every 6 (six) hours as needed for anxiety. 30 tablet 0   naloxone (NARCAN) nasal spray 4 mg/0.1 mL Take as needed for Narcotic overdose. (Patient not taking: Reported on 01/29/2023) 1 each 0   pantoprazole (PROTONIX) 40 MG tablet Take 1 tablet (40 mg total) by mouth daily. 30 tablet 0   polyethylene glycol powder (MIRALAX) 17 GM/SCOOP powder Take 17 g by mouth daily as needed (constipation). (Patient taking differently: Take 17 g by mouth daily.) 255 g 0   QUEtiapine (SEROQUEL XR) 300 MG 24 hr tablet Take 1 tablet (300 mg total) by mouth at bedtime. 30 tablet 0   senna (SENOKOT) 8.6 MG TABS tablet Take 2 tablets (17.2 mg total) by mouth at bedtime. (Patient not taking: Reported on 01/29/2023) 120 tablet 0   thiamine (VITAMIN B-1) 100 MG tablet Take 1 tablet (100 mg total) by mouth daily 100 tablet 3    Musculoskeletal:  Patient observed resting in bed.   Psychiatric Specialty Exam: Presentation  General Appearance:  Fairly Groomed  Eye Contact: Fair  Speech: Garbled  Speech Volume: Normal  Handedness:No data recorded  Mood and Affect  Mood: Anxious  Affect: Congruent   Thought Process  Thought Processes: Goal Directed  Descriptions of Associations:Circumstantial  Orientation:Full (Time, Place and Person)  Thought Content:Logical  History of Schizophrenia/Schizoaffective disorder:No data recorded Duration of Psychotic Symptoms:No data recorded Hallucinations:Hallucinations: None  Ideas of Reference:None  Suicidal Thoughts:Suicidal Thoughts: No SI Passive Intent and/or Plan: Without Intent; Without Plan  Homicidal Thoughts:Homicidal Thoughts: No   Sensorium  Memory: Immediate Fair; Recent Fair  Judgment: Fair  Insight: Fair   Producer, television/film/video: Fair  Attention Span: Fair  Recall: [employer] of Knowledge: Fair  Language: Fair   Psychomotor Activity  Psychomotor Activity: Psychomotor Activity: Restlessness   Assets  Assets: Resilience; Physical Health; Desire for Improvement    Sleep  Sleep: Sleep: Fair   Physical Exam: Physical Exam Vitals and nursing note reviewed.  Pulmonary:     Effort: Pulmonary effort is normal.  Neurological:     Mental Status: He is alert.  Psychiatric:        Attention and Perception: Attention normal.        Mood and Affect: Affect normal. Mood is anxious.        Speech: Speech is rapid and pressured and slurred.        Behavior: Behavior is cooperative.        Thought Content: Thought content normal.        Cognition and Memory: Memory normal.        Judgment: Judgment is impulsive.    Review of Systems  Psychiatric/Behavioral:  Positive for depression and substance abuse.    Blood pressure (!) 162/134, pulse 100, temperature 98.2 F (36.8 C), temperature source Oral, resp. rate 18, SpO2 100 %. There is no height or weight on file to calculate BMI.   Medical Decision Making: Pt case reviewed and discussed with Dr. Lucianne Muss. Recommending substance abuse treatment. Pt is requesting substance abuse treatment with a door to door transfer. Patient unable to be accepted to [facility], due to being on Suboxone. Patient willing to stop taking Suboxone to be accepted into [facility]. Will restart Seroquel 300 mg Qhs and Gabapentin 300 mg TID. Start on Clonidine  Detox. Pt is voluntary at this time, I do not think he would meet IVC criteria due to history and frequency of hospital presentations and drug relapses.    Alona BeneJADEKA MOTLEY-MANGRUM, PMHNP 03/13/2023 6:38 PM

## 2023-03-13 NOTE — ED Notes (Signed)
Pt called [facility], they interviewed patient, accepted pt and requested he call back for specific address at 1130 today.

## 2023-03-13 NOTE — ED Notes (Signed)
This RN assumed care of patient and received off going transfer of care report from off going RN. Pt is resting on bed at this time, respirations are spontaneous, even, unlabored and symmetrical bilaterally. Pt skin tone is appropriate for ethnicity, dry and warm. Sitter at bedside.

## 2023-03-13 NOTE — ED Notes (Signed)
Provider at bedside

## 2023-03-13 NOTE — ED Triage Notes (Signed)
Pt states "I don't feel safe here because there is people looking for me because I owe them money. I am hearing voices and my heart is about to explode. I am freaking out. I don't know what is going on" Hx IV drug use.

## 2023-03-13 NOTE — ED Notes (Signed)
NAD noted, respirations are equal bilaterally and unlabored at this time. Pt resting in gurney and denies any unmet needs.  

## 2023-03-13 NOTE — ED Notes (Signed)
Patient to room 27. Patient ambulated to room. Patient cooperative. Patient oriented to room and unit.

## 2023-03-13 NOTE — ED Notes (Signed)
Pt is resting on bed at this time, respirations are spontaneous, even, unlabored and symmetrical bilaterally. Pt skin tone is appropriate for ethnicity, dry and warm. Sitter at bedside.

## 2023-03-13 NOTE — ED Provider Notes (Signed)
Emergency Medicine Observation Re-evaluation Note  Jason Buchanan is a 37 y.o. male, seen on rounds today.  Pt initially presented to the ED for complaints of Manic Behavior Currently, the patient is not having any acute complaints.  Physical Exam  BP 122/78 (BP Location: Left Arm)   Pulse 93   Temp 98 F (36.7 C) (Oral)   Resp 16   Ht 5\' 6"  (1.676 m)   Wt 70 kg   SpO2 98%   BMI 24.91 kg/m  Physical Exam General: Resting comfortably in stretcher Lungs: Normal work of breathing Psych: Calm  ED Course / MDM  EKG:EKG Interpretation  Date/Time:  Monday March 12 2023 19:30:16 EDT Ventricular Rate:  110 PR Interval:  166 QRS Duration: 76 QT Interval:  324 QTC Calculation: 438 R Axis:   64 Text Interpretation: Sinus tachycardia Otherwise normal ECG When compared with ECG of 28-Jan-2023 14:30, PREVIOUS ECG IS PRESENT Confirmed by Blane Ohara [phone]) on 03/12/2023 7:53:15 PM  I have reviewed the labs performed to date as well as medications administered while in observation.  Recent changes in the last 24 hours include evaluated by psychiatry overnight.  They did not feel that he required IVC at this time and will reassess in the morning.  Plan  Current plan is for psychiatric reassessment this morning.    Rondel Baton, MD 03/13/23 [phone]

## 2023-03-13 NOTE — ED Notes (Addendum)
Pt dressed out into burgundy scrubs and belongings (phone, clothing, and shoes) placed in the [facility] D cabinet, pt wanded by security

## 2023-03-13 NOTE — ED Notes (Signed)
This RN reviewed discharge instructions with patient. He verbalized understanding and denied any further questions. Pt well appearing upon discharge, denies pain and reported anxiety related to leaving the hospital.  Pt reassured and willingly got into safe transport car. Richardson Dopp at [facility] [place] called to infor him pt is on the way.

## 2023-03-13 NOTE — ED Provider Notes (Signed)
[facility] accepted by [facility] [phone]).  They are requesting door-to-door transfer for the patient at 11:30 AM.  Patient is cleared by psychiatry team.  Kerby Less and is calm and cooperative.  Denies any AVH, SI, or HI at this time.   Rondel Baton, MD 03/13/23 1026

## 2023-03-13 NOTE — Discharge Instructions (Signed)
You are seen in the emergency department for your substance use.  Please go directly to sober living with the transport team.  Follow-up with your primary doctor several days after being discharged.

## 2023-03-14 DIAGNOSIS — F39 Unspecified mood [affective] disorder: Secondary | ICD-10-CM | POA: Diagnosis not present

## 2023-03-14 LAB — RESP PANEL BY RT-PCR (RSV, FLU A&B, COVID)  RVPGX2
Influenza A by PCR: NEGATIVE
Influenza B by PCR: NEGATIVE
Resp Syncytial Virus by PCR: NEGATIVE
SARS Coronavirus 2 by RT PCR: NEGATIVE

## 2023-03-14 MED ORDER — LORAZEPAM 1 MG PO TABS
1.0000 mg | ORAL_TABLET | Freq: Once | ORAL | Status: AC
  Administered 2023-03-14: 1 mg via ORAL
  Filled 2023-03-14: qty 1

## 2023-03-14 NOTE — ED Notes (Signed)
Pt requesting something more for anxiety. Pt made aware waiting on orders from psych. Pt returned to bed.

## 2023-03-14 NOTE — ED Notes (Signed)
Pt sitting up in bed eating lunch tray. Pt remains calm and cooperative.  

## 2023-03-14 NOTE — ED Notes (Signed)
Pt resting comfortably at this time. Respirations even and unlabored.  

## 2023-03-14 NOTE — Progress Notes (Signed)
[facility] Psych ED Progress Note  03/14/2023 12:54 PM Jason Buchanan  MRN:  [id number]   Principal Problem: Mood disorder Diagnosis:  Principal Problem:   Mood disorder Active Problems:   Polysubstance abuse   ED Assessment Time Calculation: Start Time: 1100 Stop Time: 1115 Total Time in Minutes (Assessment Completion): 15   Subjective:  On evaluation today, the patient is laying in his bed, with his mask used as an eye mask, he is in no acute distress. He is calm and cooperative during this assessment. His appearance is appropriate for environment. His eye contact is minimal. Speech is clear and coherent, normal pace and normal volume. He reports his mood as "ok".  Affect is congruent with mood. Thought process is coherent.  Thought content is appropriate. He denies auditory and visual hallucinations.  No indication that he is responding to internal stimuli during this assessment.  No delusions elicited during this assessment. He denies suicidal ideations. He denies homicidal ideations. 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 Support, encouragement and reassurance provided about ongoing stressors and patient provided with opportunity for questions.    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 Emergency Department at [facility] ED from 03/11/2023 in [facility] Emergency Department at [facility]  C-SSRS RISK CATEGORY No Risk No Risk No Risk       Past Medical History:  Past Medical History:  Diagnosis Date   Asthma    Mood disorder    Polysubstance abuse    Schizophrenia     Past Surgical History:  Procedure Laterality Date   ANKLE SURGERY     APPLICATION OF WOUND VAC Right  11/22/2021   Procedure: APPLICATION OF WOUND VAC;  Surgeon: Knute Neu, MD;  Location: [facility];  Service: Plastics;  Laterality: Right;   APPLICATION OF WOUND VAC Right 11/25/2021   Procedure: APPLICATION OF WOUND VAC;  Surgeon: Knute Neu, MD;  [facility];  Service: Plastics;  Laterality: Right;   APPLICATION OF WOUND VAC Right 12/07/2021   Procedure: APPLICATION OF WOUND VAC;  Surgeon: Knute Neu, MD;  [facility];  Service: Plastics;  Laterality: Right;   APPLICATION OF WOUND VAC Right 12/14/2021   Procedure: APPLICATION OF WOUND VAC;  Surgeon: [name], MD;  [facility];  Service: Plastics;  Laterality: Right;   APPLICATION OF WOUND VAC Right 12/22/2021   Procedure: WOUND VAC CHANGE;  Surgeon: [name], MD;  [facility];  Service: Plastics;  Laterality: Right;   APPLICATION OF WOUND VAC Right 12/29/2021   Procedure: APPLICATION OF WOUND VAC;  Surgeon: [name], MD;  [facility];  Service: Plastics;  Laterality: Right;   APPLICATION OF WOUND VAC Right 01/03/2022   Procedure: WOUND VAC APPLICATION;  Surgeon: [name], MD;  [facility];  Service: Plastics;  Laterality: Right;   BIOPSY  11/17/2021   Procedure: BIOPSY;  Surgeon: Vida Rigger, MD;  [facility];  Service: Endoscopy;;   BUBBLE STUDY  09/30/2021   Procedure: BUBBLE STUDY;  Surgeon: Orpah Cobb, MD;  Location: [facility] ENDOSCOPY;  Service: Cardiovascular;;   DEBRIDEMENT AND CLOSURE WOUND Right 01/03/2022   Procedure: DEBRIDEMENT AND COMPLEX CLOSURE OF RIGHT ARM WOUND;  Surgeon: Domenica Reamer,  Reuel Boom, MD;  [facility];  Service: Plastics;  Laterality: Right;   ESOPHAGOGASTRODUODENOSCOPY (EGD) WITH PROPOFOL N/A 11/17/2021   Procedure: ESOPHAGOGASTRODUODENOSCOPY (EGD) WITH PROPOFOL;  Surgeon: Vida Rigger, MD;  [facility];  Service: Endoscopy;  Laterality: N/A;   FRACTURE SURGERY     I & D EXTREMITY Right 09/27/2021   Procedure: IRRIGATION AND DEBRIDEMENT  OF ABSCESS RIGHT HAND;  Surgeon: Knute Neu, MD;  [facility]: [facility];  Service: Plastics;  Laterality: Right;   I & D EXTREMITY Bilateral 11/15/2021   Procedure: IRRIGATION AND DEBRIDEMENT EXTREMITY;  Surgeon: Knute Neu, MD;  [facility];  Service: Plastics;  Laterality: Bilateral;   I & D EXTREMITY Right 11/17/2021   Procedure: IRRIGATION AND DEBRIDEMENT EXTREMITY;  Surgeon: Knute Neu, MD;  [facility];  Service: Plastics;  Laterality: Right;   I & D EXTREMITY Bilateral 12/01/2021   Procedure: INCISION AND DRAINAGE BILATERAL UPPER EXTREMITIES WITH  WOUND VAC CHANGE RIGHT;  Surgeon: Gomez Cleverly, MD;  [facility];  Service: Orthopedics;  Laterality: Bilateral;   I & D EXTREMITY Right 12/07/2021   Procedure: IRRIGATION AND DEBRIDEMENT EXTREMITY, MANIPULATION OF FINGERS UNDER ANESTHESIA;  Surgeon: Knute Neu, MD;  [facility];  Service: Plastics;  Laterality: Right;   I & D EXTREMITY Left 01/13/2023   Procedure: IRRIGATION AND DEBRIDEMENT EXTREMITY;  Surgeon: Bradly Bienenstock, MD;  [facility];  Service: Orthopedics;  Laterality: Left;   INCISION AND DRAINAGE OF WOUND Left 09/27/2021   Procedure: IRRIGATION AND DEBRIDEMENT OF ABSCESS LEFT INDEX FINGER;  Surgeon: Knute Neu, MD;  [facility]: [facility];  Service: Plastics;  Laterality: Left;   INCISION AND DRAINAGE OF WOUND Bilateral 11/22/2021   Procedure: IRRIGATION AND DEBRIDEMENT WOUND;  Surgeon: Knute Neu, MD;  [facility];  Service: Plastics;  Laterality: Bilateral;   INCISION AND DRAINAGE OF WOUND Bilateral 11/25/2021   Procedure: BILATERAL IRRIGATION AND DEBRIDEMENT BILATERAL ARMS;  Surgeon: Knute Neu, MD;  [facility];  Service: Plastics;  Laterality: Bilateral;   INCISION AND DRAINAGE OF WOUND Right 12/14/2021   Procedure: IRRIGATION AND DEBRIDEMENT RIGHT ARM;  Surgeon: [name], MD;  [facility];  Service: Plastics;  Laterality: Right;   INCISION AND DRAINAGE OF WOUND Right  12/22/2021   Procedure: IRRIGATION AND DEBRIDEMENT ARM  WOUND;  Surgeon: [name], MD;  [facility];  Service: Plastics;  Laterality: Right;  1 hour   INCISION AND DRAINAGE OF WOUND Right 12/29/2021   Procedure: DEBRIDEMENT RIGHT ARM  WOUND;  Surgeon: [name], MD;  [facility];  Service: Plastics;  Laterality: Right;   JOINT REPLACEMENT     SCLEROTHERAPY  11/17/2021   Procedure: Susa Day;  Surgeon: Vida Rigger, MD;  [facility];  Service: Endoscopy;;   SKIN SPLIT GRAFT Right 01/03/2022   Procedure: SKIN GRAFT SPLIT THICKNESS;  Surgeon: [name], MD;  [facility];  Service: Plastics;  Laterality: Right;   TEE WITHOUT CARDIOVERSION N/A 09/30/2021   Procedure: TRANSESOPHAGEAL ECHOCARDIOGRAM (TEE);  Surgeon: Orpah Cobb, MD;  Location: [facility] ENDOSCOPY;  Service: Cardiovascular;  Laterality: N/A;   Family History: No family history on file.  Social History:  Social History   Substance and Sexual Activity  Alcohol Use Not Currently   Comment: occasionally     Social History   Substance and Sexual Activity  Drug Use Yes   Types: IV, Marijuana   Comment: heroin, meth    Social History   Socioeconomic History   Marital status: Single  Spouse name: Not on file   Number of children: Not on file   Years of education: Not on file   Highest education level: Not on file  Occupational History   Not on file  Tobacco Use   Smoking status: Some Days    Packs/day: .5    Types: Cigarettes   Smokeless tobacco: Never  Substance and Sexual Activity   Alcohol use: Not Currently    Comment: occasionally   Drug use: Yes    Types: IV, Marijuana    Comment: heroin, meth   Sexual activity: Not on file  Other Topics Concern   Not on file  Social History Narrative   ** Merged History Encounter **       Social Determinants of Health   Financial Resource Strain: Not on file  Food Insecurity: No Food Insecurity (01/13/2023)   Hunger Vital  Sign    Worried About Running Out of Food in the Last Year: Never true    Ran Out of Food in the Last Year: Never true  Transportation Needs: Unmet Transportation Needs (01/13/2023)   PRAPARE - Administrator, Civil ServiceTransportation    Lack of Transportation (Medical): Yes    Lack of Transportation (Non-Medical): Yes  Physical Activity: Not on file  Stress: Not on file  Social Connections: Not on file    Sleep: Good  Appetite:  Fair  Current Medications: Current Facility-Administered Medications  Medication Dose Route Frequency Provider Last Rate Last Admin   cloNIDine (CATAPRES) tablet 0.1 mg  0.1 mg Oral QID Motley-Mangrum, Xia Stohr A, PMHNP   0.1 mg at 03/14/23 1030   Followed by   Melene Muller[START ON 03/16/2023] cloNIDine (CATAPRES) tablet 0.1 mg  0.1 mg Oral BH-qamhs Motley-Mangrum, Jermaine Tholl A, PMHNP       Followed by   Melene Muller[START ON 03/18/2023] cloNIDine (CATAPRES) tablet 0.1 mg  0.1 mg Oral QAC breakfast Motley-Mangrum, Bhumi Godbey A, PMHNP       dicyclomine (BENTYL) tablet 20 mg  20 mg Oral Q6H PRN Motley-Mangrum, Swayze Pries A, PMHNP       gabapentin (NEURONTIN) capsule 300 mg  300 mg Oral TID Motley-Mangrum, Kashae Carstens A, PMHNP   300 mg at 03/14/23 1029   hydrOXYzine (ATARAX) tablet 25 mg  25 mg Oral Q6H PRN Motley-Mangrum, Afrah Burlison A, PMHNP   25 mg at 03/14/23 1030   loperamide (IMODIUM) capsule 2-4 mg  2-4 mg Oral PRN Motley-Mangrum, Denyce Harr A, PMHNP       methocarbamol (ROBAXIN) tablet 500 mg  500 mg Oral Q8H PRN Motley-Mangrum, Renel Ende A, PMHNP       ondansetron (ZOFRAN-ODT) disintegrating tablet 4 mg  4 mg Oral Q6H PRN Motley-Mangrum, Ysabelle Goodroe A, PMHNP       QUEtiapine (SEROQUEL XR) 24 hr tablet 300 mg  300 mg Oral QHS Motley-Mangrum, Suellen Durocher A, PMHNP   300 mg at 03/14/23 0050   Current Outpatient Medications  Medication Sig Dispense Refill   buprenorphine-naloxone (SUBOXONE) 8-2 mg SUBL SL tablet Place 1 tablet under the tongue 2 (two) times daily for 7 days 14 tablet 0   folic acid (FOLVITE) 1 MG tablet Take 1 tablet (1 mg total)  by mouth daily. 90 tablet 0   gabapentin (NEURONTIN) 300 MG capsule Take 1 capsule (300 mg total) by mouth 3 (three) times daily. 90 capsule 0   hydrOXYzine (ATARAX) 10 MG tablet Take 1 tablet (10 mg total) by mouth every 6 (six) hours as needed for anxiety. 30 tablet 0   naloxone (NARCAN) nasal spray 4 mg/0.1 mL Take as  needed for Narcotic overdose. (Patient not taking: Reported on 01/29/2023) 1 each 0   pantoprazole (PROTONIX) 40 MG tablet Take 1 tablet (40 mg total) by mouth daily. 30 tablet 0   polyethylene glycol powder (MIRALAX) 17 GM/SCOOP powder Take 17 g by mouth daily as needed (constipation). (Patient taking differently: Take 17 g by mouth daily.) 255 g 0   QUEtiapine (SEROQUEL XR) 300 MG 24 hr tablet Take 1 tablet (300 mg total) by mouth at bedtime. 30 tablet 0   senna (SENOKOT) 8.6 MG TABS tablet Take 2 tablets (17.2 mg total) by mouth at bedtime. (Patient not taking: Reported on 01/29/2023) 120 tablet 0   thiamine (VITAMIN B-1) 100 MG tablet Take 1 tablet (100 mg total) by mouth daily 100 tablet 3    Lab Results:  Results for orders placed or performed during the hospital encounter of 03/13/23 (from the past 48 hour(s))  Resp panel by RT-PCR (RSV, Flu A&B, Covid) Anterior Nasal Swab     Status: None   Collection Time: 03/14/23  4:44 AM   Specimen: Anterior Nasal Swab  Result Value Ref Range   SARS Coronavirus 2 by RT PCR NEGATIVE NEGATIVE    Comment: (NOTE) SARS-CoV-2 target nucleic acids are NOT DETECTED.  The SARS-CoV-2 RNA is generally detectable in upper respiratory specimens during the acute phase of infection. The lowest concentration of SARS-CoV-2 viral copies this assay can detect is 138 copies/mL. A negative result does not preclude SARS-Cov-2 infection and should not be used as the sole basis for treatment or other patient management decisions. A negative result may occur with  improper specimen collection/handling, submission of specimen other than nasopharyngeal  swab, presence of viral mutation(s) within the areas targeted by this assay, and inadequate number of viral copies(<138 copies/mL). A negative result must be combined with clinical observations, patient history, and epidemiological information. The expected result is Negative.  Fact Sheet for Patients:  BloggerCourse.com  Fact Sheet for Healthcare Providers:  SeriousBroker.it  This test is no t yet approved or cleared by the [place] FDA and  has been authorized for detection and/or diagnosis of SARS-CoV-2 by FDA under an Emergency Use Authorization (EUA). This EUA will remain  in effect (meaning this test can be used) for the duration of the COVID-19 declaration under Section 564(b)(1) of the Act, 21 U.S.C.section 360bbb-3(b)(1), unless the authorization is terminated  or revoked sooner.       Influenza A by PCR NEGATIVE NEGATIVE   Influenza B by PCR NEGATIVE NEGATIVE    Comment: (NOTE) The Xpert Xpress SARS-CoV-2/FLU/RSV plus assay is intended as an aid in the diagnosis of influenza from Nasopharyngeal swab specimens and should not be used as a sole basis for treatment. Nasal washings and aspirates are unacceptable for Xpert Xpress SARS-CoV-2/FLU/RSV testing.  Fact Sheet for Patients: BloggerCourse.com  Fact Sheet for Healthcare Providers: SeriousBroker.it  This test is not yet approved or cleared by the [place] FDA and has been authorized for detection and/or diagnosis of SARS-CoV-2 by FDA under an Emergency Use Authorization (EUA). This EUA will remain in effect (meaning this test can be used) for the duration of the COVID-19 declaration under Section 564(b)(1) of the Act, 21 U.S.C. section 360bbb-3(b)(1), unless the authorization is terminated or revoked.     Resp Syncytial Virus by PCR NEGATIVE NEGATIVE    Comment: (NOTE) Fact Sheet for  Patients: BloggerCourse.com  Fact Sheet for Healthcare Providers: SeriousBroker.it  This test is not yet approved or cleared by the [place]  States FDA and has been authorized for detection and/or diagnosis of SARS-CoV-2 by FDA under an Emergency Use Authorization (EUA). This EUA will remain in effect (meaning this test can be used) for the duration of the COVID-19 declaration under Section 564(b)(1) of the Act, 21 U.S.C. section 360bbb-3(b)(1), unless the authorization is terminated or revoked.  Performed at [facility], [address], [place], [place] [address]     Blood Alcohol level:  Lab Results  Component Value Date   [facility] <10 03/12/2023   ETH <10 03/11/2023    Physical Findings:  CIWA:    COWS:     Musculoskeletal: Strength & Muscle Tone: within normal limits Gait & Station: normal Patient leans: N/A  Psychiatric Specialty Exam:  Presentation  General Appearance:  Appropriate for Environment  Eye Contact: Minimal  Speech: Clear and Coherent  Speech Volume: Normal  Handedness:No data recorded  Mood and Affect  Mood: Euthymic  Affect: Appropriate   Thought Process  Thought Processes: Coherent  Descriptions of Associations:Intact  Orientation:Full (Time, Place and Person)  Thought Content:WDL  History of Schizophrenia/Schizoaffective disorder:No data recorded Duration of Psychotic Symptoms:No data recorded Hallucinations:Hallucinations: None  Ideas of Reference:None  Suicidal Thoughts:Suicidal Thoughts: No  Homicidal Thoughts:Homicidal Thoughts: No   Sensorium  Memory: Immediate Fair; Remote Fair  Judgment: Fair  Insight: Fair   Art therapist  Concentration: Fair  Attention Span: Fair  Recall: Fair  Fund of Knowledge: Fair  Language: Good   Psychomotor Activity  Psychomotor Activity: Psychomotor Activity: Normal   Assets   Assets: Communication Skills; Physical Health; Desire for Improvement; Social Support   Sleep  Sleep: Sleep: Good    Physical Exam: Physical Exam Eyes:     Pupils: Pupils are equal, round, and reactive to light.  Pulmonary:     Effort: Pulmonary effort is normal.  Musculoskeletal:        General: Normal range of motion.  Neurological:     Mental Status: He is alert.  Psychiatric:        Attention and Perception: Attention normal.        Mood and Affect: Mood is anxious.        Speech: Speech normal.        Behavior: Behavior is cooperative.        Thought Content: Thought content normal.        Cognition and Memory: Memory normal.        Judgment: Judgment is impulsive.    Review of Systems  Gastrointestinal:  Positive for nausea.  Musculoskeletal:  Positive for joint pain.  Skin: Negative.   Psychiatric/Behavioral:  Positive for substance abuse.     Blood pressure 101/68, pulse 77, temperature 98.6 F (37 C), temperature source Oral, resp. rate 16, SpO2 100 %. There is no height or weight on file to calculate BMI.   Medical Decision Making: Patient continues to require detox treatment. Patient accepted to [facility] to detox from suboxone.    Nickolai Rinks MOTLEY-MANGRUM, PMHNP 03/14/2023, 12:54 PM

## 2023-03-14 NOTE — ED Notes (Signed)
Patient has been calm and cooperative all night. He has slept all night.

## 2023-03-14 NOTE — Progress Notes (Signed)
LCSW Progress Note  [id number]   Jason Buchanan  03/14/2023  1:38 AM    Inpatient Behavioral Health Placement  Pt meets inpatient criteria per [facility], PMPHNP. There are no available beds within [facility] BH system per [facility] Fransico Munachimso, RN. Referral was sent to [facility] for medical detox and other inpatient facilities that specialize with SUD. Referral was sent to the following facilities;     Destination  Service Provider Address Phone Fax  [facility]  [address] [place] [address] [phone]  [facility]  [address] [place] [address] [phone]  [facility]  [address], [place] [address] [phone]  [facility] [address]  [address] [place] [address] [phone]  [facility]  [address] [place] [address] [phone]  [facility]  [address] [place] [address] [phone]  [facility]  [address], [place] [address] [phone]  [facility]  [address], [place] [address] [phone]  [facility]  [address] [place] [address] [phone]  [facility]  [address], [place] [address] [phone]    Situation ongoing,  CSW will follow up.    Maryjean Ka, MSW, LCSWA 03/14/2023 1:38 AM

## 2023-03-14 NOTE — ED Notes (Signed)
Patient is resting comfortably. Calm and cooperative with taking his meds.

## 2023-03-14 NOTE — Progress Notes (Signed)
CSW inquired to [facility] Fransico Kayshawn, RN about pt to be reviewed for [facility]. [facility] advised that pt will need a COVID and also informed to fax out as shared in previous note. CSW added nursing Bertram Millard, RN.   Maryjean Ka, MSW, LCSWA 03/14/2023 1:43 AM

## 2023-03-15 ENCOUNTER — Other Ambulatory Visit (HOSPITAL_COMMUNITY)
Admission: EM | Admit: 2023-03-15 | Discharge: 2023-03-19 | Disposition: A | Discharge status: Home / Self Care | Payer: No Payment, Other | Attending: Psychiatry | Admitting: Psychiatry

## 2023-03-15 DIAGNOSIS — F1520 Other stimulant dependence, uncomplicated: Secondary | ICD-10-CM | POA: Diagnosis not present

## 2023-03-15 DIAGNOSIS — F4310 Post-traumatic stress disorder, unspecified: Secondary | ICD-10-CM | POA: Diagnosis not present

## 2023-03-15 DIAGNOSIS — F15259 Other stimulant dependence with stimulant-induced psychotic disorder, unspecified: Secondary | ICD-10-CM | POA: Diagnosis present

## 2023-03-15 DIAGNOSIS — F39 Unspecified mood [affective] disorder: Secondary | ICD-10-CM

## 2023-03-15 DIAGNOSIS — F319 Bipolar disorder, unspecified: Secondary | ICD-10-CM | POA: Insufficient documentation

## 2023-03-15 MED ORDER — NICOTINE 21 MG/24HR TD PT24
21.0000 mg | MEDICATED_PATCH | Freq: Every day | TRANSDERMAL | Status: DC
  Administered 2023-03-15 – 2023-03-19 (×5): 21 mg via TRANSDERMAL
  Filled 2023-03-15 (×6): qty 1

## 2023-03-15 MED ORDER — HYDROXYZINE HCL 25 MG PO TABS
50.0000 mg | ORAL_TABLET | Freq: Four times a day (QID) | ORAL | Status: DC | PRN
  Administered 2023-03-15 – 2023-03-18 (×7): 50 mg via ORAL
  Filled 2023-03-15 (×2): qty 2
  Filled 2023-03-15: qty 1
  Filled 2023-03-15 (×8): qty 2

## 2023-03-15 MED ORDER — DICYCLOMINE HCL 20 MG PO TABS: 20.0000 mg | ORAL_TABLET | Freq: Four times a day (QID) | ORAL | Status: DC | PRN

## 2023-03-15 MED ORDER — GABAPENTIN 300 MG PO CAPS
300.0000 mg | ORAL_CAPSULE | Freq: Three times a day (TID) | ORAL | Status: DC
  Administered 2023-03-15 – 2023-03-19 (×11): 300 mg via ORAL
  Filled 2023-03-15: qty 21
  Filled 2023-03-15 (×11): qty 1

## 2023-03-15 MED ORDER — CLONIDINE HCL 0.1 MG PO TABS: 0.1000 mg | ORAL_TABLET | Freq: Every day | ORAL | Status: DC

## 2023-03-15 MED ORDER — METHOCARBAMOL 500 MG PO TABS
500.0000 mg | ORAL_TABLET | Freq: Three times a day (TID) | ORAL | Status: DC | PRN
  Administered 2023-03-15 – 2023-03-19 (×5): 500 mg via ORAL
  Filled 2023-03-15 (×5): qty 1

## 2023-03-15 MED ORDER — TRAZODONE HCL 50 MG PO TABS
50.0000 mg | ORAL_TABLET | Freq: Every day | ORAL | Status: DC
  Administered 2023-03-15 – 2023-03-18 (×3): 50 mg via ORAL
  Filled 2023-03-15 (×3): qty 1
  Filled 2023-03-15: qty 7
  Filled 2023-03-15: qty 1

## 2023-03-15 MED ORDER — CLONIDINE HCL 0.1 MG PO TABS
0.1000 mg | ORAL_TABLET | Freq: Four times a day (QID) | ORAL | Status: DC
  Administered 2023-03-16 (×3): 0.1 mg via ORAL
  Filled 2023-03-15 (×5): qty 1

## 2023-03-15 MED ORDER — ONDANSETRON 4 MG PO TBDP: 4.0000 mg | ORAL_TABLET | Freq: Four times a day (QID) | ORAL | Status: DC | PRN

## 2023-03-15 MED ORDER — NAPROXEN 500 MG PO TABS
500.0000 mg | ORAL_TABLET | Freq: Two times a day (BID) | ORAL | Status: DC | PRN
  Administered 2023-03-18 (×2): 500 mg via ORAL
  Filled 2023-03-15 (×2): qty 1

## 2023-03-15 MED ORDER — ALUM & MAG HYDROXIDE-SIMETH 200-200-20 MG/5ML PO SUSP: 30.0000 mL | ORAL | Status: DC | PRN

## 2023-03-15 MED ORDER — GABAPENTIN 100 MG PO CAPS: 200.0000 mg | ORAL_CAPSULE | Freq: Two times a day (BID) | ORAL | Status: DC

## 2023-03-15 MED ORDER — MAGNESIUM HYDROXIDE 400 MG/5ML PO SUSP
30.0000 mL | Freq: Every day | ORAL | Status: DC | PRN
  Filled 2023-03-15: qty 30

## 2023-03-15 MED ORDER — CLONIDINE HCL 0.1 MG PO TABS: 0.1000 mg | ORAL_TABLET | ORAL | Status: DC

## 2023-03-15 MED ORDER — LOPERAMIDE HCL 2 MG PO CAPS: 2.0000 mg | ORAL_CAPSULE | ORAL | Status: DC | PRN

## 2023-03-15 MED ORDER — QUETIAPINE FUMARATE ER 300 MG PO TB24
300.0000 mg | ORAL_TABLET | Freq: Every day | ORAL | Status: DC
  Administered 2023-03-16: 300 mg via ORAL
  Filled 2023-03-15 (×2): qty 7
  Filled 2023-03-15: qty 1

## 2023-03-15 MED ORDER — HYDROXYZINE HCL 25 MG PO TABS: 25.0000 mg | ORAL_TABLET | Freq: Four times a day (QID) | ORAL | Status: DC | PRN

## 2023-03-15 NOTE — ED Notes (Signed)
Pt admitted to [facility].  Denies SI/HI/AVH. Calm, cooperative throughout interview process. Skin assessment completed. Oriented to unit. Meal and drink offered. At currrent, pt continue to  denies SI/HI/AVH. Pt verbally contract for safety. Will monitor for safety. 

## 2023-03-15 NOTE — ED Provider Notes (Signed)
Emergency Medicine Observation Re-evaluation Note  Jason Buchanan is a 37 y.o. male, seen on rounds today.  Pt initially presented to the ED for complaints of Psychiatric Evaluation Currently, the patient is awake and alert.  Per nurses, he's been doing better since off drugs.   Physical Exam  BP 94/66 (BP Location: Left Arm)   Pulse 71   Temp 97.6 F (36.4 C) (Oral)   Resp 16   SpO2 96%  Physical Exam General: awake and alert Cardiac: rr Lungs: clear Psych: calm  ED Course / MDM  EKG:EKG Interpretation  Date/Time:  Tuesday March 13 2023 15:37:26 EDT Ventricular Rate:  95 PR Interval:  142 QRS Duration: 78 QT Interval:  332 QTC Calculation: 417 R Axis:   40 Text Interpretation: Normal sinus rhythm Normal ECG When compared with ECG of 07/12/2023, HEART RATE has decreased Confirmed by Dione Booze ([id number]) on 03/14/2023 12:08:40 AM  I have reviewed the labs performed to date as well as medications administered while in observation.  Recent changes in the last 24 hours include none.  Plan  Current plan is for awaiting placement.    Jason Lefevre, MD 03/15/23 [phone]

## 2023-03-15 NOTE — ED Provider Notes (Signed)
Facility Based Crisis Admission H&P  Date: 03/15/23 Patient Name: Jason Buchanan MRN: [id number] Chief Complaint: "I having body aches, irritability, and nausea"  Diagnoses:  Final diagnoses:  Methamphetamine use disorder, severe, dependence   HPI:  Jason Buchanan 37 y.o., male patient presented to [facility] as a transfer from [facility] ED, voluntarily, for admission to facility based crisis center to continue detox of heroin. Of note, patient's UDS is positive for methamphetamines and THC only.   Jason Buchanan, 37 y.o., male patient seen face to face by this provider, consulted with Dr. Lucianne Muss; and chart reviewed on 03/15/23.    On evaluation Jason Buchanan reports needing to Detox from heroin and last used 2 days ago. Jason Buchanan has history of polysubstance use with opioids, cocaine, amphetamines and THC. Most recent UDS on chart review at the ED methamphetamines and THC only. Patient endorses active withdrawal symptoms of irritability, body aches, and nausea. He was prescribed Suboxone however, discontinued medication, patient can't recall the last dose he had of Suboxone. Patient plans to go to [facility] once his detox is completed.   During evaluation Jason Buchanan is in no acute distress. He he is alert, oriented x 4, calm, cooperative and attentive.  His  mood is dysphoric with congruent affect.  He has normal speech, and behavior.  Objectively there is no evidence of psychosis/mania or delusional thinking.  Patient is able to converse coherently, goal directed thoughts, no distractibility, or pre-occupation. He also denies active suicidal/self-harm/homicidal ideation, psychosis, and paranoia.  Patient answered question appropriately.     PHQ 2-9:  Flowsheet Row ED from 03/15/2023 in [facility]  Thoughts that you would be better off dead, or of hurting yourself in some way Several days  PHQ-9 Total Score 22       Flowsheet Row ED from  03/15/2023 in [facility] ED from 03/13/2023 in [facility] Emergency Department at [facility] ED from 03/12/2023 in [facility] Emergency Department at [facility]  C-SSRS RISK CATEGORY No Risk No Risk No Risk        Total Time spent with patient: 30 minutes  Musculoskeletal  Strength & Muscle Tone: within normal limits Gait & Station: normal Patient leans: N/A  Psychiatric Specialty Exam  Presentation General Appearance:  Appropriate for Environment  Eye Contact: Minimal  Speech: Clear and Coherent  Speech Volume: Normal  Mood and Affect  Mood: Euthymic  Affect: Appropriate   Thought Process  Thought Processes: Coherent  Descriptions of Associations:Intact  Orientation:Full (Time, Place and Person)  Thought Content:WDL    Hallucinations:Hallucinations: None  Ideas of Reference:None  Suicidal Thoughts:Suicidal Thoughts: No  Homicidal Thoughts:Homicidal Thoughts: No   Sensorium  Memory: Immediate Fair; Remote Fair  Judgment: Fair  Insight: Fair   Art therapist  Concentration: Fair  Attention Span: Fair  Recall: Fair  Fund of Knowledge: Fair  Language: Good   Psychomotor Activity  Psychomotor Activity: Psychomotor Activity: Normal   Assets  Assets: Communication Skills; Physical Health; Desire for Improvement; Social Support   Sleep  Sleep: Sleep: Good    Physical Exam HENT:     Head: Normocephalic.  Eyes:     Extraocular Movements: Extraocular movements intact.     Pupils: Pupils are equal, round, and reactive to light.  Cardiovascular:     Rate and Rhythm: Normal rate.  Pulmonary:     Effort: Pulmonary effort is normal.  Breath sounds: Normal breath sounds.  Musculoskeletal:        General: Normal range of motion.     Cervical back: Normal range of motion.  Neurological:     General: No focal deficit present.     Mental Status: He is alert.      Review of Systems  Psychiatric/Behavioral:  Positive for depression and substance abuse. Negative for hallucinations, memory loss and suicidal ideas. The patient is nervous/anxious and has insomnia.     Blood pressure (!) 96/58, pulse 72, temperature 98.6 F (37 C), temperature source Oral, resp. rate 18, SpO2 100 %. There is no height or weight on file to calculate BMI.  Past Psychiatric History:  Polysubstance  Abuse, IV drug use, Hx of Suboxone treatment, not currently prescribed.  Is the patient at risk to self? No   Is the patient a risk to others? No   Past Medical History:  Denies contributory medical history.    Last Labs:  Admission on 03/13/2023, Discharged on 03/15/2023  Component Date Value Ref Range Status   SARS Coronavirus 2 by RT PCR 03/14/2023 NEGATIVE  NEGATIVE Final   Comment: (NOTE) SARS-CoV-2 target nucleic acids are NOT DETECTED.  The SARS-CoV-2 RNA is generally detectable in upper respiratory specimens during the acute phase of infection. The lowest concentration of SARS-CoV-2 viral copies this assay can detect is 138 copies/mL. A negative result does not preclude SARS-Cov-2 infection and should not be used as the sole basis for treatment or other patient management decisions. A negative result may occur with  improper specimen collection/handling, submission of specimen other than nasopharyngeal swab, presence of viral mutation(s) within the areas targeted by this assay, and inadequate number of viral copies(<138 copies/mL). A negative result must be combined with clinical observations, patient history, and epidemiological information. The expected result is Negative.  Fact Sheet for Patients:  BloggerCourse.comhttps://www.fda.gov/media/152166/download  Fact Sheet for Healthcare Providers:  SeriousBroker.ithttps://www.fda.gov/media/152162/download  This test is no                          t yet approved or cleared by the [place] FDA and  has been authorized for detection  and/or diagnosis of SARS-CoV-2 by FDA under an Emergency Use Authorization (EUA). This EUA will remain  in effect (meaning this test can be used) for the duration of the COVID-19 declaration under Section 564(b)(1) of the Act, 21 U.S.C.section 360bbb-3(b)(1), unless the authorization is terminated  or revoked sooner.       Influenza A by PCR 03/14/2023 NEGATIVE  NEGATIVE Final   Influenza B by PCR 03/14/2023 NEGATIVE  NEGATIVE Final   Comment: (NOTE) The Xpert Xpress SARS-CoV-2/FLU/RSV plus assay is intended as an aid in the diagnosis of influenza from Nasopharyngeal swab specimens and should not be used as a sole basis for treatment. Nasal washings and aspirates are unacceptable for Xpert Xpress SARS-CoV-2/FLU/RSV testing.  Fact Sheet for Patients: BloggerCourse.comhttps://www.fda.gov/media/152166/download  Fact Sheet for Healthcare Providers: SeriousBroker.ithttps://www.fda.gov/media/152162/download  This test is not yet approved or cleared by the [place] FDA and has been authorized for detection and/or diagnosis of SARS-CoV-2 by FDA under an Emergency Use Authorization (EUA). This EUA will remain in effect (meaning this test can be used) for the duration of the COVID-19 declaration under Section 564(b)(1) of the Act, 21 U.S.C. section 360bbb-3(b)(1), unless the authorization is terminated or revoked.     Resp Syncytial Virus by PCR 03/14/2023 NEGATIVE  NEGATIVE Final   Comment: (NOTE) Fact  Sheet for Patients: BloggerCourse.com  Fact Sheet for Healthcare Providers: SeriousBroker.it  This test is not yet approved or cleared by the [place] FDA and has been authorized for detection and/or diagnosis of SARS-CoV-2 by FDA under an Emergency Use Authorization (EUA). This EUA will remain in effect (meaning this test can be used) for the duration of the COVID-19 declaration under Section 564(b)(1) of the Act, 21 U.S.C. section 360bbb-3(b)(1), unless  the authorization is terminated or revoked.  Performed at [facility], [address], [place], [place] [address]   Admission on 03/12/2023, Discharged on 03/13/2023  Component Date Value Ref Range Status   Sodium 03/12/2023 139  135 - 145 mmol/L Final   Potassium 03/12/2023 3.8  3.5 - 5.1 mmol/L Final   Chloride 03/12/2023 110  98 - 111 mmol/L Final   CO2 03/12/2023 21 (L)  22 - 32 mmol/L Final   Glucose, Bld 03/12/2023 113 (H)  70 - 99 mg/dL Final   Glucose reference range applies only to samples taken after fasting for at least 8 hours.   BUN 03/12/2023 14  6 - 20 mg/dL Final   Creatinine, Ser 03/12/2023 0.77  0.61 - 1.24 mg/dL Final   Calcium 95/62/1308 8.7 (L)  8.9 - 10.3 mg/dL Final   Total Protein 65/78/4696 6.0 (L)  6.5 - 8.1 g/dL Final   Albumin 29/52/8413 3.4 (L)  3.5 - 5.0 g/dL Final   AST 24/40/1027 57 (H)  15 - 41 U/L Final   ALT 03/12/2023 42  0 - 44 U/L Final   Alkaline Phosphatase 03/12/2023 112  38 - 126 U/L Final   Total Bilirubin 03/12/2023 0.5  0.3 - 1.2 mg/dL Final   GFR, Estimated 03/12/2023 >60  >60 mL/min Final   Comment: (NOTE) Calculated using the CKD-EPI Creatinine Equation (2021)    Anion gap 03/12/2023 8  5 - 15 Final   Performed at [facility], [address] [place], [place] [address]   Alcohol, Ethyl (B) 03/12/2023 <10  <10 mg/dL Final   Comment: (NOTE) Lowest detectable limit for serum alcohol is 10 mg/dL.  For medical purposes only. Performed at [facility], [address], [place], [place] [address]    Opiates 03/12/2023 NONE DETECTED  NONE DETECTED Final   Cocaine 03/12/2023 NONE DETECTED  NONE DETECTED Final   Benzodiazepines 03/12/2023 NONE DETECTED  NONE DETECTED Final   Amphetamines 03/12/2023 POSITIVE (A)  NONE DETECTED Final   Tetrahydrocannabinol 03/12/2023 POSITIVE (A)  NONE DETECTED Final   Barbiturates 03/12/2023 NONE DETECTED  NONE DETECTED Final   Comment: (NOTE) DRUG SCREEN FOR MEDICAL  PURPOSES ONLY.  IF CONFIRMATION IS NEEDED FOR ANY PURPOSE, NOTIFY LAB WITHIN 5 DAYS.  LOWEST DETECTABLE LIMITS FOR URINE DRUG SCREEN Drug Class                     Cutoff (ng/mL) Amphetamine and metabolites    1000 Barbiturate and metabolites    200 Benzodiazepine                 200 Opiates and metabolites        300 Cocaine and metabolites        300 THC                            50 Performed at [facility], [address], [place], [place] [address]    WBC 03/12/2023 7.4  4.0 - 10.5 K/uL Final   RBC 03/12/2023 3.82 (L)  4.22 - 5.81 MIL/uL Final   Hemoglobin 03/12/2023 10.8 (L)  13.0 - 17.0 g/dL Final   HCT 64/40/3474 32.7 (L)  39.0 - 52.0 % Final   MCV 03/12/2023 85.6  80.0 - 100.0 fL Final   MCH 03/12/2023 28.3  26.0 - 34.0 pg Final   MCHC 03/12/2023 33.0  30.0 - 36.0 g/dL Final   RDW 25/95/6387 13.6  11.5 - 15.5 % Final   Platelets 03/12/2023 353  150 - 400 K/uL Final   nRBC 03/12/2023 0.0  0.0 - 0.2 % Final   Neutrophils Relative % 03/12/2023 66  % Final   Neutro Abs 03/12/2023 4.9  1.7 - 7.7 K/uL Final   Lymphocytes Relative 03/12/2023 19  % Final   Lymphs Abs 03/12/2023 1.4  0.7 - 4.0 K/uL Final   Monocytes Relative 03/12/2023 7  % Final   Monocytes Absolute 03/12/2023 0.5  0.1 - 1.0 K/uL Final   Eosinophils Relative 03/12/2023 7  % Final   Eosinophils Absolute 03/12/2023 0.6 (H)  0.0 - 0.5 K/uL Final   Basophils Relative 03/12/2023 1  % Final   Basophils Absolute 03/12/2023 0.1  0.0 - 0.1 K/uL Final   Immature Granulocytes 03/12/2023 0  % Final   Abs Immature Granulocytes 03/12/2023 0.01  0.00 - 0.07 K/uL Final   Performed at [facility], [address], [place], [place] [address]   Salicylate Lvl 03/12/2023 <7.0 (L)  7.0 - 30.0 mg/dL Final   Performed at [facility], [address] [place], [place] [address]   Acetaminophen (Tylenol), Serum 03/12/2023 <10 (L)  10 - 30 ug/mL Final   Comment: (NOTE) Therapeutic concentrations vary  significantly. A range of 10-30 ug/mL  may be an effective concentration for many patients. However, some  are best treated at concentrations outside of this range. Acetaminophen concentrations >150 ug/mL at 4 hours after ingestion  and >50 ug/mL at 12 hours after ingestion are often associated with  toxic reactions.  Performed at [facility], [address] [place], [place] [address]    Lactic Acid, Venous 03/12/2023 1.5  0.5 - 1.9 mmol/L Final   Performed at [facility], [address], [place], [place] [address]   Color, Urine 03/12/2023 YELLOW  YELLOW Final   APPearance 03/12/2023 HAZY (A)  CLEAR Final   Specific Gravity, Urine 03/12/2023 1.027  1.005 - 1.030 Final   pH 03/12/2023 5.0  5.0 - 8.0 Final   Glucose, UA 03/12/2023 NEGATIVE  NEGATIVE mg/dL Final   Hgb urine dipstick 03/12/2023 MODERATE (A)  NEGATIVE Final   Bilirubin Urine 03/12/2023 SMALL (A)  NEGATIVE Final   Ketones, ur 03/12/2023 5 (A)  NEGATIVE mg/dL Final   Protein, ur 60/09/9322 30 (A)  NEGATIVE mg/dL Final   Nitrite 55/73/2202 NEGATIVE  NEGATIVE Final   Leukocytes,Ua 03/12/2023 SMALL (A)  NEGATIVE Final   RBC / HPF 03/12/2023 >50  0 - 5 RBC/hpf Final   WBC, UA 03/12/2023 21-50  0 - 5 WBC/hpf Final   Bacteria, UA 03/12/2023 MANY (A)  NONE SEEN Final   Squamous Epithelial / HPF 03/12/2023 0-5  0 - 5 /HPF Final   Mucus 03/12/2023 PRESENT   Final   Ca Oxalate Crys, UA 03/12/2023 PRESENT   Final   Performed at [facility], [address] [place], [place] [address]  Admission on 03/11/2023, Discharged on 03/12/2023  Component Date  Value Ref Range Status   Lipase 03/11/2023 23  11 - 51 U/L Final   Performed at [facility], [address] [place], [place] [address]   Sodium 03/11/2023 139  135 - 145 mmol/L Final   Potassium 03/11/2023 3.0 (L)  3.5 - 5.1 mmol/L Final   Chloride 03/11/2023 108  98 - 111 mmol/L Final   CO2 03/11/2023 22  22 - 32 mmol/L Final   Glucose, Bld  03/11/2023 116 (H)  70 - 99 mg/dL Final   Glucose reference range applies only to samples taken after fasting for at least 8 hours.   BUN 03/11/2023 13  6 - 20 mg/dL Final   Creatinine, Ser 03/11/2023 0.63  0.61 - 1.24 mg/dL Final   Calcium 61/47/0929 8.5 (L)  8.9 - 10.3 mg/dL Final   Total Protein 57/47/3403 6.5  6.5 - 8.1 g/dL Final   Albumin 70/96/4383 3.7  3.5 - 5.0 g/dL Final   AST 81/84/0375 63 (H)  15 - 41 U/L Final   ALT 03/11/2023 41  0 - 44 U/L Final   Alkaline Phosphatase 03/11/2023 115  38 - 126 U/L Final   Total Bilirubin 03/11/2023 0.6  0.3 - 1.2 mg/dL Final   GFR, Estimated 03/11/2023 >60  >60 mL/min Final   Comment: (NOTE) Calculated using the CKD-EPI Creatinine Equation (2021)    Anion gap 03/11/2023 9  5 - 15 Final   Performed at [facility], [address], [place], [place] [address]   WBC 03/11/2023 10.6 (H)  4.0 - 10.5 K/uL Final   RBC 03/11/2023 4.22  4.22 - 5.81 MIL/uL Final   Hemoglobin 03/11/2023 11.6 (L)  13.0 - 17.0 g/dL Final   HCT 77/02/4034 36.5 (L)  39.0 - 52.0 % Final   MCV 03/11/2023 86.5  80.0 - 100.0 fL Final   MCH 03/11/2023 27.5  26.0 - 34.0 pg Final   MCHC 03/11/2023 31.8  30.0 - 36.0 g/dL Final   RDW 24/81/8590 13.4  11.5 - 15.5 % Final   Platelets 03/11/2023 351  150 - 400 K/uL Final   nRBC 03/11/2023 0.0  0.0 - 0.2 % Final   Performed at [facility], [address] [place], [place] [address]   Acetaminophen (Tylenol), Serum 03/11/2023 <10 (L)  10 - 30 ug/mL Final   Comment: (NOTE) Therapeutic concentrations vary significantly. A range of 10-30 ug/mL  may be an effective concentration for many patients. However, some  are best treated at concentrations outside of this range. Acetaminophen concentrations >150 ug/mL at 4 hours after ingestion  and >50 ug/mL at 12 hours after ingestion are often associated with  toxic reactions.  Performed at [facility], [address] [place], [place] [address]    Salicylate Lvl 03/11/2023 <7.0 (L)  7.0 - 30.0 mg/dL Final   Performed at [facility], [address], [place], [place] [address]   Alcohol, Ethyl (B) 03/11/2023 <10  <10 mg/dL Final   Comment: (NOTE) Lowest detectable limit for serum alcohol is 10 mg/dL.  For medical purposes only. Performed at [facility], [address] [place], [place] [address]    Lactic Acid, Venous 03/11/2023 1.0  0.5 - 1.9 mmol/L Final   Performed at [facility], [address] [place], [place] [address]   Specimen Description 03/11/2023    Final  Value:BLOOD BLOOD RIGHT ARM Performed at [facility], [address] [place], [place] [address]    Special Requests 03/11/2023    Final                   Value:BOTTLES DRAWN AEROBIC AND ANAEROBIC Blood Culture adequate volume Performed at [facility], [address], [place], [place] [address]    Culture 03/11/2023    Final                   Value:NO GROWTH 3 DAYS Performed at [facility], [address] [place], [place] [address]    Report Status 03/11/2023 PENDING   Incomplete  Admission on 01/28/2023, Discharged on 02/06/2023  Component Date Value Ref Range Status   Sodium 01/28/2023 132 (L)  135 - 145 mmol/L Final   Potassium 01/28/2023 3.4 (L)  3.5 - 5.1 mmol/L Final   Chloride 01/28/2023 100  98 - 111 mmol/L Final   CO2 01/28/2023 22  22 - 32 mmol/L Final   Glucose, Bld 01/28/2023 84  70 - 99 mg/dL Final   Glucose reference range applies only to samples taken after fasting for at least 8 hours.   BUN 01/28/2023 11  6 - 20 mg/dL Final   Creatinine, Ser 01/28/2023 0.56 (L)  0.61 - 1.24 mg/dL Final   Calcium 92/44/6286 8.9  8.9 - 10.3 mg/dL Final   Total Protein 38/17/7116 7.0  6.5 - 8.1 g/dL Final   Albumin 57/90/3833 4.0  3.5 - 5.0 g/dL Final   AST 38/32/9191 41  15 - 41 U/L Final   ALT 01/28/2023  38  0 - 44 U/L Final   Alkaline Phosphatase 01/28/2023 110  38 - 126 U/L Final   Total Bilirubin 01/28/2023 1.3 (H)  0.3 - 1.2 mg/dL Final   GFR, Estimated 01/28/2023 >60  >60 mL/min Final   Comment: (NOTE) Calculated using the CKD-EPI Creatinine Equation (2021)    Anion gap 01/28/2023 10  5 - 15 Final   Performed at [facility], [address] [place], [place] [address]   WBC 01/28/2023 10.1  4.0 - 10.5 K/uL Final   RBC 01/28/2023 4.50  4.22 - 5.81 MIL/uL Final   Hemoglobin 01/28/2023 12.9 (L)  13.0 - 17.0 g/dL Final   HCT 04/59/9774 39.5  39.0 - 52.0 % Final   MCV 01/28/2023 87.8  80.0 - 100.0 fL Final   MCH 01/28/2023 28.7  26.0 - 34.0 pg Final   MCHC 01/28/2023 32.7  30.0 - 36.0 g/dL Final   RDW 14/23/9532 11.9  11.5 - 15.5 % Final   Platelets 01/28/2023 429 (H)  150 - 400 K/uL Final   nRBC 01/28/2023 0.0  0.0 - 0.2 % Final   Neutrophils Relative % 01/28/2023 49  % Final   Neutro Abs 01/28/2023 5.0  1.7 - 7.7 K/uL Final   Lymphocytes Relative 01/28/2023 31  % Final   Lymphs Abs 01/28/2023 3.1  0.7 - 4.0 K/uL Final   Monocytes Relative 01/28/2023 13  % Final   Monocytes Absolute 01/28/2023 1.3 (H)  0.1 - 1.0 K/uL Final   Eosinophils Relative 01/28/2023 6  % Final   Eosinophils Absolute 01/28/2023 0.6 (H)  0.0 - 0.5 K/uL Final   Basophils Relative 01/28/2023 1  % Final   Basophils Absolute 01/28/2023 0.1  0.0 - 0.1 K/uL Final   Immature Granulocytes 01/28/2023 0  % Final   Abs Immature Granulocytes 01/28/2023 0.04  0.00 - 0.07 K/uL Final   Performed at [facility], [address], [place], [place] [address]   Specimen Description 01/28/2023    Final                   Value:BLOOD LEFT HAND Performed at [facility], [address], [place], [place] [address]    Special Requests 01/28/2023    Final                   Value:BOTTLES DRAWN AEROBIC AND ANAEROBIC Blood Culture results may not be optimal due to an excessive volume of blood  received in culture bottles Performed at [facility], [address], [place], [place] [address]    Culture 01/28/2023    Final                   Value:NO GROWTH 5 DAYS Performed at [facility], [address] [place], [place] [address]    Report Status 01/28/2023 02/02/2023 FINAL   Final   Specimen Description 01/28/2023    Final                   Value:BLOOD RIGHT FOREARM Performed at [facility], [address] [place], [place] [address]    Special Requests 01/28/2023    Final                   Value:BOTTLES DRAWN AEROBIC AND ANAEROBIC Blood Culture adequate volume Performed at [facility], [address], [place], [place] [address]    Culture 01/28/2023    Final                   Value:NO GROWTH 5 DAYS Performed at [facility], [address], [place], [place] [address]    Report Status 01/28/2023 02/02/2023 FINAL   Final   Lactic Acid, Venous 01/28/2023 1.4  0.5 - 1.9 mmol/L Final   Performed at [facility], [address] [place], [place] [address]   Opiates 01/28/2023 NONE DETECTED  NONE DETECTED Final   Cocaine 01/28/2023 NONE DETECTED  NONE DETECTED Final   Benzodiazepines 01/28/2023 NONE DETECTED  NONE DETECTED Final   Amphetamines 01/28/2023 POSITIVE (A)  NONE DETECTED Final   Tetrahydrocannabinol 01/28/2023 POSITIVE (A)  NONE DETECTED Final   Barbiturates 01/28/2023 NONE DETECTED  NONE DETECTED Final   Comment: (NOTE) DRUG SCREEN FOR MEDICAL PURPOSES ONLY.  IF CONFIRMATION IS NEEDED FOR ANY PURPOSE, NOTIFY LAB WITHIN 5 DAYS.  LOWEST DETECTABLE LIMITS FOR URINE DRUG SCREEN Drug Class                     Cutoff (ng/mL) Amphetamine and metabolites    1000 Barbiturate and metabolites    200 Benzodiazepine                 200 Opiates and metabolites        300 Cocaine and metabolites        300 THC                            50 Performed at [facility], [address] [place], [place] [address]    Color, Urine 01/28/2023 YELLOW  YELLOW Final   APPearance 01/28/2023 CLEAR  CLEAR Final   Specific Gravity, Urine 01/28/2023 1.019  1.005 - 1.030  Final   pH 01/28/2023 6.0  5.0 - 8.0 Final   Glucose, UA 01/28/2023 NEGATIVE  NEGATIVE mg/dL Final   Hgb urine dipstick 01/28/2023 NEGATIVE  NEGATIVE Final   Bilirubin Urine 01/28/2023 NEGATIVE  NEGATIVE Final   Ketones, ur 01/28/2023 20 (A)  NEGATIVE mg/dL Final   Protein, ur 16/09/9603 30 (A)  NEGATIVE mg/dL Final   Nitrite 54/08/8118 NEGATIVE  NEGATIVE Final   Leukocytes,Ua 01/28/2023 NEGATIVE  NEGATIVE Final   RBC / HPF 01/28/2023 21-50  0 - 5 RBC/hpf Final   WBC, UA 01/28/2023 6-10  0 - 5 WBC/hpf Final   Bacteria, UA 01/28/2023 NONE SEEN  NONE SEEN Final   Squamous Epithelial / HPF 01/28/2023 0-5  0 - 5 /HPF Final   Mucus 01/28/2023 PRESENT   Final   Ca Oxalate Crys, UA 01/28/2023 PRESENT   Final   Performed at [facility], [address], [place], [place] [address]   WBC 01/29/2023 5.8  4.0 - 10.5 K/uL Final   RBC 01/29/2023 3.62 (L)  4.22 - 5.81 MIL/uL Final   Hemoglobin 01/29/2023 10.9 (L)  13.0 - 17.0 g/dL Final   HCT 95/62/1308 33.2 (L)  39.0 - 52.0 % Final   MCV 01/29/2023 91.7  80.0 - 100.0 fL Final   MCH 01/29/2023 30.1  26.0 - 34.0 pg Final   MCHC 01/29/2023 32.8  30.0 - 36.0 g/dL Final   RDW 65/78/4696 12.3  11.5 - 15.5 % Final   Platelets 01/29/2023 290  150 - 400 K/uL Final   nRBC 01/29/2023 0.0  0.0 - 0.2 % Final   Performed at [facility], [address], [place], [place] [address]   Sodium 01/29/2023 135  135 - 145 mmol/L Final   Potassium 01/29/2023 3.3 (L)  3.5 - 5.1 mmol/L Final   Chloride 01/29/2023 108  98 - 111 mmol/L Final   CO2 01/29/2023 20 (L)  22 - 32 mmol/L Final   Glucose, Bld 01/29/2023 147 (H)  70 - 99 mg/dL Final   Glucose reference range applies only to samples taken after fasting for at least 8 hours.   BUN 01/29/2023 8   6 - 20 mg/dL Final   Creatinine, Ser 01/29/2023 0.54 (L)  0.61 - 1.24 mg/dL Final   Calcium 41/32/4401 8.1 (L)  8.9 - 10.3 mg/dL Final   GFR, Estimated 01/29/2023 >60  >60 mL/min Final   Comment: (NOTE) Calculated using the CKD-EPI Creatinine Equation (2021)    Anion gap 01/29/2023 7  5 - 15 Final   Performed at [facility], [address], [place], [place] [address]   Magnesium 01/29/2023 2.1  1.7 - 2.4 mg/dL Final   Performed at [facility], [address] [place], [place] [address]   Phosphorus 01/29/2023 2.9  2.5 - 4.6 mg/dL Final   Performed at [facility], [address], [place], [place] [address]   Potassium 01/28/2023 3.2 (L)  3.5 - 5.1 mmol/L Final   Performed at [facility], [address], [place], [place] [address]   Magnesium 01/28/2023 1.8  1.7 - 2.4 mg/dL Final   Performed at [facility], [address] [place], [place] [address]   Sodium 01/30/2023 138  135 - 145 mmol/L Final   Potassium 01/30/2023 4.5  3.5 - 5.1 mmol/L Final   Chloride 01/30/2023 110  98 - 111 mmol/L Final   CO2 01/30/2023 20 (L)  22 - 32 mmol/L  Final   Glucose, Bld 01/30/2023 119 (H)  70 - 99 mg/dL Final   Glucose reference range applies only to samples taken after fasting for at least 8 hours.   BUN 01/30/2023 7  6 - 20 mg/dL Final   Creatinine, Ser 01/30/2023 0.60 (L)  0.61 - 1.24 mg/dL Final   Calcium 16/09/9603 8.0 (L)  8.9 - 10.3 mg/dL Final   GFR, Estimated 01/30/2023 >60  >60 mL/min Final   Comment: (NOTE) Calculated using the CKD-EPI Creatinine Equation (2021)    Anion gap 01/30/2023 8  5 - 15 Final   Performed at [facility], [address] [place], [place] [address]   Magnesium 01/30/2023 1.5 (L)  1.7 - 2.4 mg/dL Final   Performed at [facility], [address], [place], [place] [address]   WBC 01/30/2023 4.1  4.0 - 10.5 K/uL Final   RBC  01/30/2023 3.74 (L)  4.22 - 5.81 MIL/uL Final   Hemoglobin 01/30/2023 11.0 (L)  13.0 - 17.0 g/dL Final   HCT 78/29/5621 33.5 (L)  39.0 - 52.0 % Final   MCV 01/30/2023 89.6  80.0 - 100.0 fL Final   MCH 01/30/2023 29.4  26.0 - 34.0 pg Final   MCHC 01/30/2023 32.8  30.0 - 36.0 g/dL Final   RDW 30/86/5784 12.4  11.5 - 15.5 % Final   Platelets 01/30/2023 254  150 - 400 K/uL Final   nRBC 01/30/2023 0.0  0.0 - 0.2 % Final   Performed at [facility], [address], [place], [place] [address]   Glucose-Capillary 01/30/2023 116 (H)  70 - 99 mg/dL Final   Glucose reference range applies only to samples taken after fasting for at least 8 hours.   Comment 1 01/30/2023 Notify RN   Final   Comment 2 01/30/2023 Document in Chart   Final   WBC 01/31/2023 5.0  4.0 - 10.5 K/uL Final   RBC 01/31/2023 4.23  4.22 - 5.81 MIL/uL Final   Hemoglobin 01/31/2023 12.2 (L)  13.0 - 17.0 g/dL Final   HCT 52/84/1324 38.1 (L)  39.0 - 52.0 % Final   MCV 01/31/2023 90.1  80.0 - 100.0 fL Final   MCH 01/31/2023 28.8  26.0 - 34.0 pg Final   MCHC 01/31/2023 32.0  30.0 - 36.0 g/dL Final   RDW 40/09/2724 12.3  11.5 - 15.5 % Final   Platelets 01/31/2023 310  150 - 400 K/uL Final   nRBC 01/31/2023 0.0  0.0 - 0.2 % Final   Neutrophils Relative % 01/31/2023 48  % Final   Neutro Abs 01/31/2023 2.4  1.7 - 7.7 K/uL Final   Lymphocytes Relative 01/31/2023 33  % Final   Lymphs Abs 01/31/2023 1.6  0.7 - 4.0 K/uL Final   Monocytes Relative 01/31/2023 11  % Final   Monocytes Absolute 01/31/2023 0.6  0.1 - 1.0 K/uL Final   Eosinophils Relative 01/31/2023 7  % Final   Eosinophils Absolute 01/31/2023 0.3  0.0 - 0.5 K/uL Final   Basophils Relative 01/31/2023 1  % Final   Basophils Absolute 01/31/2023 0.1  0.0 - 0.1 K/uL Final   Immature Granulocytes 01/31/2023 0  % Final   Abs Immature Granulocytes 01/31/2023 0.02  0.00 - 0.07 K/uL Final   Performed at [facility], [address],  [place], [place] [address]   Sodium 01/31/2023 136  135 - 145 mmol/L Final   Potassium 01/31/2023 3.6  3.5 - 5.1 mmol/L Final  Chloride 01/31/2023 102  98 - 111 mmol/L Final   CO2 01/31/2023 26  22 - 32 mmol/L Final   Glucose, Bld 01/31/2023 117 (H)  70 - 99 mg/dL Final   Glucose reference range applies only to samples taken after fasting for at least 8 hours.   BUN 01/31/2023 10  6 - 20 mg/dL Final   Creatinine, Ser 01/31/2023 0.55 (L)  0.61 - 1.24 mg/dL Final   Calcium 69/62/9528 8.4 (L)  8.9 - 10.3 mg/dL Final   GFR, Estimated 01/31/2023 >60  >60 mL/min Final   Comment: (NOTE) Calculated using the CKD-EPI Creatinine Equation (2021)    Anion gap 01/31/2023 8  5 - 15 Final   Performed at [facility], [address], [place], [place] [address]   Magnesium 01/31/2023 1.8  1.7 - 2.4 mg/dL Final   Performed at [facility], [address], [place], [place] [address]   Phosphorus 01/31/2023 4.0  2.5 - 4.6 mg/dL Final   Performed at [facility], [address] [place], [place] [address]   WBC 02/04/2023 6.3  4.0 - 10.5 K/uL Final   RBC 02/04/2023 4.86  4.22 - 5.81 MIL/uL Final   Hemoglobin 02/04/2023 14.1  13.0 - 17.0 g/dL Final   HCT 64/40/3474 43.8  39.0 - 52.0 % Final   MCV 02/04/2023 90.1  80.0 - 100.0 fL Final   MCH 02/04/2023 29.0  26.0 - 34.0 pg Final   MCHC 02/04/2023 32.2  30.0 - 36.0 g/dL Final   RDW 25/95/6387 12.3  11.5 - 15.5 % Final   Platelets 02/04/2023 272  150 - 400 K/uL Final   nRBC 02/04/2023 0.0  0.0 - 0.2 % Final   Neutrophils Relative % 02/04/2023 35  % Final   Neutro Abs 02/04/2023 2.2  1.7 - 7.7 K/uL Final   Lymphocytes Relative 02/04/2023 41  % Final   Lymphs Abs 02/04/2023 2.6  0.7 - 4.0 K/uL Final   Monocytes Relative 02/04/2023 13  % Final   Monocytes Absolute 02/04/2023 0.8  0.1 - 1.0 K/uL Final   Eosinophils Relative 02/04/2023 9  % Final   Eosinophils Absolute 02/04/2023 0.6 (H)  0.0 - 0.5 K/uL  Final   Basophils Relative 02/04/2023 1  % Final   Basophils Absolute 02/04/2023 0.1  0.0 - 0.1 K/uL Final   Immature Granulocytes 02/04/2023 1  % Final   Abs Immature Granulocytes 02/04/2023 0.03  0.00 - 0.07 K/uL Final   Performed at [facility], [address], [place], [place] [address]   Sodium 02/04/2023 134 (L)  135 - 145 mmol/L Final   Potassium 02/04/2023 4.2  3.5 - 5.1 mmol/L Final   Chloride 02/04/2023 100  98 - 111 mmol/L Final   CO2 02/04/2023 25  22 - 32 mmol/L Final   Glucose, Bld 02/04/2023 111 (H)  70 - 99 mg/dL Final   Glucose reference range applies only to samples taken after fasting for at least 8 hours.   BUN 02/04/2023 18  6 - 20 mg/dL Final   Creatinine, Ser 02/04/2023 0.57 (L)  0.61 - 1.24 mg/dL Final   Calcium 29/51/8841 9.0  8.9 - 10.3 mg/dL Final   GFR, Estimated 02/04/2023 >60  >60 mL/min Final   Comment: (NOTE) Calculated using the CKD-EPI Creatinine Equation (2021)    Anion gap 02/04/2023 9  5 - 15 Final   Performed at [facility], [address], [place], [place] [address]  Magnesium 02/04/2023 1.9  1.7 - 2.4 mg/dL Final   Performed at [facility], [address], [place], [place] [address]   TSH 02/04/2023 4.836 (H)  0.350 - 4.500 uIU/mL Final   Comment: Performed by a 3rd Generation assay with a functional sensitivity of <=0.01 uIU/mL. Performed at [facility], [address], [place], [place] [address]   Admission on 01/13/2023, Discharged on 01/16/2023  Component Date Value Ref Range Status   Lactic Acid, Venous 01/13/2023 4.1 (HH)  0.5 - 1.9 mmol/L Final   Comment: CRITICAL RESULT CALLED TO, READ BACK BY AND VERIFIED WITH LOUIS, M @ 1347 ON 01/13/2023 BY Milly Jakob Performed at [facility], [address] [place], [place] [address]    Lactic Acid, Venous 01/13/2023 1.2  0.5 - 1.9 mmol/L Final   Performed at [facility], [address], [place], [place] [address]   Sodium 01/13/2023 138  135 - 145 mmol/L Final   Potassium 01/13/2023 2.4 (LL)  3.5 - 5.1 mmol/L Final   Comment: CRITICAL RESULT CALLED TO, READ BACK BY AND VERIFIED WITH LOUIS, M @ 1347 ON 01/13/2023 BY Deedra Ehrich, K    Chloride 01/13/2023 109  98 - 111 mmol/L Final   CO2 01/13/2023 18 (L)  22 - 32 mmol/L Final   Glucose, Bld 01/13/2023 127 (H)  70 - 99 mg/dL Final   Glucose reference range applies only to samples taken after fasting for at least 8 hours.   BUN 01/13/2023 8  6 - 20 mg/dL Final   Creatinine, Ser 01/13/2023 0.39 (L)  0.61 - 1.24 mg/dL Final   Calcium 29/56/2130 8.5 (L)  8.9 - 10.3 mg/dL Final   Total Protein 86/57/8469 6.7  6.5 - 8.1 g/dL Final   Albumin 62/95/2841 3.2 (L)  3.5 - 5.0 g/dL Final   AST 32/44/0102 33  15 - 41 U/L Final   ALT 01/13/2023 47 (H)  0 - 44 U/L Final   Alkaline Phosphatase 01/13/2023 127 (H)  38 - 126 U/L Final   Total Bilirubin 01/13/2023 0.4  0.3 - 1.2 mg/dL Final   GFR, Estimated 01/13/2023 >60  >60 mL/min Final   Comment: (NOTE) Calculated using the CKD-EPI Creatinine Equation (2021)    Anion gap 01/13/2023 11  5 - 15 Final   Performed at [facility], [address], [place], [place] [address]   WBC 01/13/2023 13.3 (H)  4.0 - 10.5 K/uL Final   RBC 01/13/2023 4.05 (L)  4.22 - 5.81 MIL/uL Final   Hemoglobin 01/13/2023 12.1 (L)  13.0 - 17.0 g/dL Final   HCT 64/40/3474 37.5 (L)  39.0 - 52.0 % Final   MCV 01/13/2023 92.6  80.0 - 100.0 fL Final   MCH 01/13/2023 29.9  26.0 - 34.0 pg Final   MCHC 01/13/2023 32.3  30.0 - 36.0 g/dL Final   RDW 25/95/6387 11.7  11.5 - 15.5 % Final   Platelets 01/13/2023 292  150 - 400 K/uL Final   nRBC 01/13/2023 0.0  0.0 - 0.2 % Final   Neutrophils Relative % 01/13/2023 75  % Final   Neutro Abs 01/13/2023 9.9 (H)  1.7 - 7.7 K/uL Final   Lymphocytes Relative 01/13/2023 17  % Final   Lymphs Abs 01/13/2023 2.2  0.7 - 4.0 K/uL Final   Monocytes Relative 01/13/2023  6  % Final   Monocytes Absolute 01/13/2023 0.9  0.1 - 1.0 K/uL Final   Eosinophils Relative 01/13/2023 1  %  Final   Eosinophils Absolute 01/13/2023 0.2  0.0 - 0.5 K/uL Final   Basophils Relative 01/13/2023 0  % Final   Basophils Absolute 01/13/2023 0.0  0.0 - 0.1 K/uL Final   Immature Granulocytes 01/13/2023 1  % Final   Abs Immature Granulocytes 01/13/2023 0.06  0.00 - 0.07 K/uL Final   Performed at [facility], [address] [place], [place] [address]   Prothrombin Time 01/13/2023 13.1  11.4 - 15.2 seconds Final   INR 01/13/2023 1.0  0.8 - 1.2 Final   Comment: (NOTE) INR goal varies based on device and disease states. Performed at [facility], [address] [place], [place] [address]    aPTT 01/13/2023 27  24 - 36 seconds Final   Performed at [facility], [address], [place], [place] [address]   Specimen Description 01/13/2023    Final                   Value:BLOOD BLOOD RIGHT FOREARM Performed at [facility], [address] [place], [place] [address]    Special Requests 01/13/2023    Final                   Value:BOTTLES DRAWN AEROBIC ONLY Blood Culture adequate volume Performed at [facility], [address], [place], [place] [address]    Culture 01/13/2023    Final                   Value:NO GROWTH 5 DAYS Performed at [facility], [address], [place], [place] [address]    Report Status 01/13/2023 01/18/2023 FINAL   Final   Specimen Description 01/13/2023    Final                   Value:BLOOD BLOOD RIGHT HAND Performed at [facility], [address], [place], [place] [address]    Special Requests 01/13/2023    Final                   Value:BOTTLES DRAWN AEROBIC ONLY Blood Culture adequate volume Performed at [facility], [address], [place], [place] [address]    Culture 01/13/2023    Final                    Value:NO GROWTH 5 DAYS Performed at [facility], [address], [place], [place] [address]    Report Status 01/13/2023 01/18/2023 FINAL   Final   Opiates 01/13/2023 POSITIVE (A)  NONE DETECTED Final   Cocaine 01/13/2023 NONE DETECTED  NONE DETECTED Final   Benzodiazepines 01/13/2023 NONE DETECTED  NONE DETECTED Final   Amphetamines 01/13/2023 NONE DETECTED  NONE DETECTED Final   Tetrahydrocannabinol 01/13/2023 POSITIVE (A)  NONE DETECTED Final   Barbiturates 01/13/2023 NONE DETECTED  NONE DETECTED Final   Comment: (NOTE) DRUG SCREEN FOR MEDICAL PURPOSES ONLY.  IF CONFIRMATION IS NEEDED FOR ANY PURPOSE, NOTIFY LAB WITHIN 5 DAYS.  LOWEST DETECTABLE LIMITS FOR URINE DRUG SCREEN Drug Class                     Cutoff (ng/mL) Amphetamine and metabolites    1000 Barbiturate and metabolites    200 Benzodiazepine                 200 Opiates and metabolites  300 Cocaine and metabolites        300 THC                            50 Performed at [facility], [address] [place], [place] [address]    Magnesium 01/13/2023 1.6 (L)  1.7 - 2.4 mg/dL Final   Performed at [facility], [address], [place], [place] [address]   Specimen Description 01/13/2023    Final                   Value:TISSUE Performed at [facility], [address], [place], [place] [address]    Special Requests 01/13/2023    Final                   Value:NONE Performed at [facility], [address] [place], [place] [address]    Gram Stain 01/13/2023    Final                   Value:ABUNDANT WBC PRESENT,BOTH PMN AND MONONUCLEAR MODERATE GRAM POSITIVE COCCI    Culture 01/13/2023    Final                   Value:MODERATE STREPTOCOCCUS INTERMEDIUS NO ANAEROBES ISOLATED Performed at [facility], [address] [place], [place] [address]    Report Status 01/13/2023 01/19/2023 FINAL   Final   Organism ID, Bacteria  01/13/2023 STREPTOCOCCUS INTERMEDIUS   Final   HIV Screen 4th Generation wRfx 01/13/2023 Non Reactive  Non Reactive Final   Performed at [facility], [address] [place], [place] [address]   WBC 01/14/2023 16.4 (H)  4.0 - 10.5 K/uL Final   RBC 01/14/2023 4.37  4.22 - 5.81 MIL/uL Final   Hemoglobin 01/14/2023 13.1  13.0 - 17.0 g/dL Final   HCT 57/84/6962 41.5  39.0 - 52.0 % Final   MCV 01/14/2023 95.0  80.0 - 100.0 fL Final   MCH 01/14/2023 30.0  26.0 - 34.0 pg Final   MCHC 01/14/2023 31.6  30.0 - 36.0 g/dL Final   RDW 95/28/4132 11.8  11.5 - 15.5 % Final   Platelets 01/14/2023 330  150 - 400 K/uL Final   nRBC 01/14/2023 0.0  0.0 - 0.2 % Final   Performed at [facility], [address] [place], [place] [address]   Sodium 01/14/2023 138  135 - 145 mmol/L Final   Potassium 01/14/2023 3.9  3.5 - 5.1 mmol/L Final   Chloride 01/14/2023 107  98 - 111 mmol/L Final   CO2 01/14/2023 19 (L)  22 - 32 mmol/L Final   Glucose, Bld 01/14/2023 128 (H)  70 - 99 mg/dL Final   Glucose reference range applies only to samples taken after fasting for at least 8 hours.   BUN 01/14/2023 10  6 - 20 mg/dL Final   Creatinine, Ser 01/14/2023 0.46 (L)  0.61 - 1.24 mg/dL Final   Calcium 27/25/3664 8.7 (L)  8.9 - 10.3 mg/dL Final   Total Protein 40/34/7425 6.7  6.5 - 8.1 g/dL Final   Albumin 95/63/8756 3.2 (L)  3.5 - 5.0 g/dL Final   AST 43/32/9518 35  15 - 41 U/L Final   ALT 01/14/2023 44  0 - 44 U/L Final   Alkaline Phosphatase 01/14/2023 131 (H)  38 - 126 U/L Final   Total  Bilirubin 01/14/2023 0.4  0.3 - 1.2 mg/dL Final   GFR, Estimated 01/14/2023 >60  >60 mL/min Final   Comment: (NOTE) Calculated using the CKD-EPI Creatinine Equation (2021)    Anion gap 01/14/2023 12  5 - 15 Final   Performed at [facility], [address], [place], [place] [address]   Sodium 01/13/2023 142  135 - 145 mmol/L Final   Potassium 01/13/2023 4.0  3.5 - 5.1 mmol/L Final    Chloride 01/13/2023 109  98 - 111 mmol/L Final   CO2 01/13/2023 23  22 - 32 mmol/L Final   Glucose, Bld 01/13/2023 143 (H)  70 - 99 mg/dL Final   Glucose reference range applies only to samples taken after fasting for at least 8 hours.   BUN 01/13/2023 8  6 - 20 mg/dL Final   Creatinine, Ser 01/13/2023 0.61  0.61 - 1.24 mg/dL Final   Calcium 60/45/4098 8.6 (L)  8.9 - 10.3 mg/dL Final   GFR, Estimated 01/13/2023 >60  >60 mL/min Final   Comment: (NOTE) Calculated using the CKD-EPI Creatinine Equation (2021)    Anion gap 01/13/2023 10  5 - 15 Final   Performed at [facility], [address], [place], [place] [address]   Hepatitis B Surface Ag 01/13/2023 NON REACTIVE  NON REACTIVE Final   HCV Ab 01/13/2023 Reactive (A)  NON REACTIVE Final   Comment: (NOTE) The CDC recommends that a Reactive HCV antibody result be followed up  with a HCV Nucleic Acid Amplification test.     Hep A IgM 01/13/2023 NON REACTIVE  NON REACTIVE Final   Hep B C IgM 01/13/2023 NON REACTIVE  NON REACTIVE Final   Performed at [facility], [address], [place], [place] [address]   Magnesium 01/14/2023 2.1  1.7 - 2.4 mg/dL Final   Performed at [facility], [address], [place], [place] [address]   WBC 01/15/2023 10.7 (H)  4.0 - 10.5 K/uL Final   RBC 01/15/2023 4.33  4.22 - 5.81 MIL/uL Final   Hemoglobin 01/15/2023 12.9 (L)  13.0 - 17.0 g/dL Final   HCT 86/57/8469 39.9  39.0 - 52.0 % Final   MCV 01/15/2023 92.1  80.0 - 100.0 fL Final   MCH 01/15/2023 29.8  26.0 - 34.0 pg Final   MCHC 01/15/2023 32.3  30.0 - 36.0 g/dL Final   RDW 62/95/2841 11.9  11.5 - 15.5 % Final   Platelets 01/15/2023 346  150 - 400 K/uL Final   nRBC 01/15/2023 0.0  0.0 - 0.2 % Final   Neutrophils Relative % 01/15/2023 55  % Final   Neutro Abs 01/15/2023 5.8  1.7 - 7.7 K/uL Final   Lymphocytes Relative 01/15/2023 34  % Final   Lymphs Abs 01/15/2023 3.6  0.7 - 4.0 K/uL Final   Monocytes  Relative 01/15/2023 7  % Final   Monocytes Absolute 01/15/2023 0.8  0.1 - 1.0 K/uL Final   Eosinophils Relative 01/15/2023 3  % Final   Eosinophils Absolute 01/15/2023 0.4  0.0 - 0.5 K/uL Final   Basophils Relative 01/15/2023 1  % Final   Basophils Absolute 01/15/2023 0.1  0.0 - 0.1 K/uL Final   Immature Granulocytes 01/15/2023 0  % Final   Abs Immature Granulocytes 01/15/2023 0.03  0.00 - 0.07 K/uL Final   Performed at [facility], [address], [place], [place] [address]   Sodium 01/15/2023 140  135 - 145 mmol/L Final  Potassium 01/15/2023 3.3 (L)  3.5 - 5.1 mmol/L Final   Chloride 01/15/2023 108  98 - 111 mmol/L Final   CO2 01/15/2023 25  22 - 32 mmol/L Final   Glucose, Bld 01/15/2023 113 (H)  70 - 99 mg/dL Final   Glucose reference range applies only to samples taken after fasting for at least 8 hours.   BUN 01/15/2023 14  6 - 20 mg/dL Final   Creatinine, Ser 01/15/2023 0.52 (L)  0.61 - 1.24 mg/dL Final   Calcium 09/81/1914 8.5 (L)  8.9 - 10.3 mg/dL Final   Total Protein 78/29/5621 6.6  6.5 - 8.1 g/dL Final   Albumin 30/86/5784 3.2 (L)  3.5 - 5.0 g/dL Final   AST 69/62/9528 28  15 - 41 U/L Final   ALT 01/15/2023 40  0 - 44 U/L Final   Alkaline Phosphatase 01/15/2023 116  38 - 126 U/L Final   Total Bilirubin 01/15/2023 0.4  0.3 - 1.2 mg/dL Final   GFR, Estimated 01/15/2023 >60  >60 mL/min Final   Comment: (NOTE) Calculated using the CKD-EPI Creatinine Equation (2021)    Anion gap 01/15/2023 7  5 - 15 Final   Performed at [facility], [address] [place], [place] [address]   Magnesium 01/15/2023 1.9  1.7 - 2.4 mg/dL Final   Performed at [facility], [address] [place], [place] [address]   MRSA by PCR Next Gen 01/15/2023 DETECTED (A)  NOT DETECTED Final   Comment: RESULT CALLED TO, READ BACK BY AND VERIFIED WITH: RN Randall An AT 1255 01/15/23 CRUICKSHANK A (NOTE) The GeneXpert MRSA Assay (FDA approved for  NASAL specimens only), is one component of a comprehensive MRSA colonization surveillance program. It is not intended to diagnose MRSA infection nor to guide or monitor treatment for MRSA infections. Test performance is not FDA approved in patients less than 37 years old. Performed at [facility], [address] [place], [place] [address]    WBC 01/16/2023 9.5  4.0 - 10.5 K/uL Final   RBC 01/16/2023 4.93  4.22 - 5.81 MIL/uL Final   Hemoglobin 01/16/2023 14.6  13.0 - 17.0 g/dL Final   HCT 64/40/3474 45.0  39.0 - 52.0 % Final   MCV 01/16/2023 91.3  80.0 - 100.0 fL Final   MCH 01/16/2023 29.6  26.0 - 34.0 pg Final   MCHC 01/16/2023 32.4  30.0 - 36.0 g/dL Final   RDW 25/95/6387 11.9  11.5 - 15.5 % Final   Platelets 01/16/2023 393  150 - 400 K/uL Final   nRBC 01/16/2023 0.0  0.0 - 0.2 % Final   Neutrophils Relative % 01/16/2023 46  % Final   Neutro Abs 01/16/2023 4.4  1.7 - 7.7 K/uL Final   Lymphocytes Relative 01/16/2023 35  % Final   Lymphs Abs 01/16/2023 3.3  0.7 - 4.0 K/uL Final   Monocytes Relative 01/16/2023 8  % Final   Monocytes Absolute 01/16/2023 0.8  0.1 - 1.0 K/uL Final   Eosinophils Relative 01/16/2023 8  % Final   Eosinophils Absolute 01/16/2023 0.8 (H)  0.0 - 0.5 K/uL Final   Basophils Relative 01/16/2023 2  % Final   Basophils Absolute 01/16/2023 0.2 (H)  0.0 - 0.1 K/uL Final   Immature Granulocytes 01/16/2023 1  % Final   Abs Immature Granulocytes 01/16/2023 0.07  0.00 - 0.07 K/uL Final   Performed at [facility], [address] [place], [place] [address]   Sodium  01/16/2023 135  135 - 145 mmol/L Final   Potassium 01/16/2023 4.0  3.5 - 5.1 mmol/L Final   Chloride 01/16/2023 102  98 - 111 mmol/L Final   CO2 01/16/2023 22  22 - 32 mmol/L Final   Glucose, Bld 01/16/2023 100 (H)  70 - 99 mg/dL Final   Glucose reference range applies only to samples taken after fasting for at least 8 hours.   BUN 01/16/2023 21 (H)  6 - 20 mg/dL  Final   Creatinine, Ser 01/16/2023 0.34 (L)  0.61 - 1.24 mg/dL Final   Calcium 16/09/9603 8.5 (L)  8.9 - 10.3 mg/dL Final   Total Protein 54/08/8118 6.9  6.5 - 8.1 g/dL Final   Albumin 14/78/2956 3.3 (L)  3.5 - 5.0 g/dL Final   AST 21/30/8657 33  15 - 41 U/L Final   ALT 01/16/2023 42  0 - 44 U/L Final   Alkaline Phosphatase 01/16/2023 142 (H)  38 - 126 U/L Final   Total Bilirubin 01/16/2023 0.3  0.3 - 1.2 mg/dL Final   GFR, Estimated 01/16/2023 >60  >60 mL/min Final   Comment: (NOTE) Calculated using the CKD-EPI Creatinine Equation (2021)    Anion gap 01/16/2023 11  5 - 15 Final   Performed at [facility], [address], [place], [place] [address]   Magnesium 01/16/2023 2.1  1.7 - 2.4 mg/dL Final   Performed at [facility], [address], [place], [place] [address]   CRP 01/16/2023 1.3 (H)  <1.0 mg/dL Final   Performed at [facility], [address] [place], [place] [address]  Admission on 01/10/2023, Discharged on 01/11/2023  Component Date Value Ref Range Status   Glucose-Capillary 01/10/2023 123 (H)  70 - 99 mg/dL Final   Glucose reference range applies only to samples taken after fasting for at least 8 hours.   Sodium 01/10/2023 137  135 - 145 mmol/L Final   Potassium 01/10/2023 4.7  3.5 - 5.1 mmol/L Final   HEMOLYSIS AT THIS LEVEL MAY AFFECT RESULT   Chloride 01/10/2023 103  98 - 111 mmol/L Final   CO2 01/10/2023 24  22 - 32 mmol/L Final   Glucose, Bld 01/10/2023 105 (H)  70 - 99 mg/dL Final   Glucose reference range applies only to samples taken after fasting for at least 8 hours.   BUN 01/10/2023 8  6 - 20 mg/dL Final   Creatinine, Ser 01/10/2023 0.65  0.61 - 1.24 mg/dL Final   Calcium 40/09/2724 8.9  8.9 - 10.3 mg/dL Final   Total Protein 36/64/4034 6.9  6.5 - 8.1 g/dL Final   Albumin 74/25/9563 3.7  3.5 - 5.0 g/dL Final   AST 87/56/4332 82 (H)  15 - 41 U/L Final   HEMOLYSIS AT THIS LEVEL MAY AFFECT RESULT   ALT 01/10/2023  72 (H)  0 - 44 U/L Final   HEMOLYSIS AT THIS LEVEL MAY AFFECT RESULT   Alkaline Phosphatase 01/10/2023 158 (H)  38 - 126 U/L Final   Total Bilirubin 01/10/2023 1.1  0.3 - 1.2 mg/dL Final   HEMOLYSIS AT THIS LEVEL MAY AFFECT RESULT   GFR, Estimated 01/10/2023 >60  >60 mL/min Final   Comment: (NOTE) Calculated using the CKD-EPI Creatinine Equation (2021)    Anion gap 01/10/2023 10  5 - 15 Final   Performed at [facility], [address], [place], [place] [address]   Salicylate Lvl 01/10/2023 <7.0 (L)  7.0 - 30.0 mg/dL Final  Performed at [facility], [address], [place], [place] [address]   Acetaminophen (Tylenol), Serum 01/10/2023 <10 (L)  10 - 30 ug/mL Final   Comment: (NOTE) Therapeutic concentrations vary significantly. A range of 10-30 ug/mL  may be an effective concentration for many patients. However, some  are best treated at concentrations outside of this range. Acetaminophen concentrations >150 ug/mL at 4 hours after ingestion  and >50 ug/mL at 12 hours after ingestion are often associated with  toxic reactions.  Performed at [facility], [address], [place], [place] [address]    Alcohol, Ethyl (B) 01/10/2023 <10  <10 mg/dL Final   Comment: (NOTE) Lowest detectable limit for serum alcohol is 10 mg/dL.  For medical purposes only. Performed at [facility], [address] [place], [place] [address]    WBC 01/10/2023 13.4 (H)  4.0 - 10.5 K/uL Final   RBC 01/10/2023 4.61  4.22 - 5.81 MIL/uL Final   Hemoglobin 01/10/2023 13.9  13.0 - 17.0 g/dL Final   HCT 91/47/8295 41.7  39.0 - 52.0 % Final   MCV 01/10/2023 90.5  80.0 - 100.0 fL Final   MCH 01/10/2023 30.2  26.0 - 34.0 pg Final   MCHC 01/10/2023 33.3  30.0 - 36.0 g/dL Final   RDW 62/13/0865 11.9  11.5 - 15.5 % Final   Platelets 01/10/2023 290  150 - 400 K/uL Final   nRBC 01/10/2023 0.0  0.0 - 0.2 % Final   Neutrophils Relative % 01/10/2023 79  % Final   Neutro Abs 01/10/2023 10.7  (H)  1.7 - 7.7 K/uL Final   Lymphocytes Relative 01/10/2023 12  % Final   Lymphs Abs 01/10/2023 1.6  0.7 - 4.0 K/uL Final   Monocytes Relative 01/10/2023 6  % Final   Monocytes Absolute 01/10/2023 0.7  0.1 - 1.0 K/uL Final   Eosinophils Relative 01/10/2023 2  % Final   Eosinophils Absolute 01/10/2023 0.2  0.0 - 0.5 K/uL Final   Basophils Relative 01/10/2023 1  % Final   Basophils Absolute 01/10/2023 0.1  0.0 - 0.1 K/uL Final   Immature Granulocytes 01/10/2023 0  % Final   Abs Immature Granulocytes 01/10/2023 0.04  0.00 - 0.07 K/uL Final   Performed at [facility], [address] [place], [place] [address]  Admission on 12/12/2022, Discharged on 12/12/2022  Component Date Value Ref Range Status   Glucose-Capillary 12/12/2022 45 (L)  70 - 99 mg/dL Final   Glucose reference range applies only to samples taken after fasting for at least 8 hours.   Glucose-Capillary 12/12/2022 134 (H)  70 - 99 mg/dL Final   Glucose reference range applies only to samples taken after fasting for at least 8 hours.    Allergies: Bee venom, Acetaminophen, Succinylcholine, and Chlorhexidine  Medications:  Facility Ordered Medications  Medication   alum & mag hydroxide-simeth (MAALOX/MYLANTA) 200-200-20 MG/5ML suspension 30 mL   magnesium hydroxide (MILK OF MAGNESIA) suspension 30 mL   dicyclomine (BENTYL) tablet 20 mg   loperamide (IMODIUM) capsule 2-4 mg   methocarbamol (ROBAXIN) tablet 500 mg   naproxen (NAPROSYN) tablet 500 mg   ondansetron (ZOFRAN-ODT) disintegrating tablet 4 mg   cloNIDine (CATAPRES) tablet 0.1 mg   Followed by   Melene Muller ON 03/18/2023] cloNIDine (CATAPRES) tablet 0.1 mg   Followed by   Melene Muller ON 03/20/2023] cloNIDine (CATAPRES) tablet 0.1 mg   nicotine (NICODERM CQ - dosed in mg/24 hours) patch 21 mg   traZODone (DESYREL)  tablet 50 mg   [START ON 03/16/2023] QUEtiapine (SEROQUEL XR) 24 hr tablet 300 mg   hydrOXYzine (ATARAX) tablet 50 mg   gabapentin (NEURONTIN) capsule 300 mg    PTA Medications  Medication Sig   QUEtiapine (SEROQUEL XR) 300 MG 24 hr tablet Take 1 tablet (300 mg total) by mouth at bedtime.   pantoprazole (PROTONIX) 40 MG tablet Take 1 tablet (40 mg total) by mouth daily. (Patient taking differently: Take 40 mg by mouth daily before breakfast.)   naloxone (NARCAN) nasal spray 4 mg/0.1 mL Take as needed for Narcotic overdose. (Patient not taking: Reported on 03/14/2023)   senna (SENOKOT) 8.6 MG TABS tablet Take 2 tablets (17.2 mg total) by mouth at bedtime. (Patient not taking: Reported on 03/14/2023)   polyethylene glycol powder (MIRALAX) 17 GM/SCOOP powder Take 17 g by mouth daily as needed (constipation). (Patient taking differently: Take 17 g by mouth daily as needed for mild constipation.)   gabapentin (NEURONTIN) 300 MG capsule Take 1 capsule (300 mg total) by mouth 3 (three) times daily.   thiamine (VITAMIN B-1) 100 MG tablet Take 1 tablet (100 mg total) by mouth daily   folic acid (FOLVITE) 1 MG tablet Take 1 tablet (1 mg total) by mouth daily.   hydrOXYzine (ATARAX) 10 MG tablet Take 1 tablet (10 mg total) by mouth every 6 (six) hours as needed for anxiety. (Patient taking differently: Take 10 mg by mouth 3 (three) times daily.)   buprenorphine-naloxone (SUBOXONE) 8-2 mg SUBL SL tablet Place 1 tablet under the tongue 2 (two) times daily for 7 days   albuterol (VENTOLIN HFA) 108 (90 Base) MCG/ACT inhaler Inhale 2 puffs into the lungs every 6 (six) hours as needed for wheezing or shortness of breath.   ibuprofen (ADVIL) 200 MG tablet Take 600-800 mg by mouth every 6 (six) hours as needed for mild pain or headache.   naproxen sodium (ALEVE) 220 MG tablet Take 220-440 mg by mouth 2 (two) times daily as needed (for pain or headaches).   ZYRTEC ALLERGY 10 MG tablet Take 10 mg by mouth at bedtime.   cyclobenzaprine (FLEXERIL) 10 MG tablet Take 10 mg by mouth 3 (three) times daily as needed for muscle spasms.   fluticasone (FLONASE ALLERGY RELIEF) 50  MCG/ACT nasal spray Place 1 spray into both nostrils in the morning and at bedtime.   OPCON-A 0.027-0.315 % SOLN Place 1 drop into both eyes 3 (three) times daily as needed (for itching).    Long Term Goals: Improvement in symptoms so as ready for discharge  Short Term Goals: Patient will verbalize feelings in meetings with treatment team members., Patient will attend at least of 50% of the groups daily., Pt will complete the PHQ9 on admission, day 3 and discharge., Patient will participate in completing the [place] Suicide Severity Rating Scale, Patient will score a low risk of violence for 24 hours prior to discharge, and Patient will take medications as prescribed daily.  Medical Decision Making  COWS and Clonidine withdrawal protocol ordered. Continue Gabapentin and Seroquel.  PHQ-9 =22 indicating severe depression, currently prescribed Seroquel, titrate dose to manage depressive symptoms and mood stabilization. Rechecking CBC to re-evaluate for anemia, Hgb 10.7 (03/12/23), 11.6 (03/11/23).  Recommendations  Based on my evaluation the patient does not appear to have an emergency medical condition.  Joaquin Courts, FNP-C, PMHNP-BC  Behavioral Health Service Line  [facility]  [phone] 03/15/23  6:46 PM

## 2023-03-15 NOTE — Progress Notes (Signed)
[facility] Psych ED Progress Note  03/15/2023 12:24 PM Jason Buchanan  MRN:  [id number]   Principal Problem: Mood disorder Diagnosis:  Principal Problem:   Mood disorder Active Problems:   Polysubstance abuse   ED Assessment Time Calculation: Start Time: 1100 Stop Time: 1120 Total Time in Minutes (Assessment Completion): 20    Subjective:  On evaluation today, the patient is laying in his bed, with his mask used as an eye mask, he is in no acute distress. He is calm and cooperative during this assessment. His appearance is appropriate for environment. His eye contact is minimal. Speech is clear and coherent, normal pace and normal volume. He reports his mood as "good".  Affect is congruent with mood. Thought process is coherent.  Thought content is appropriate. He denies auditory and visual hallucinations.  No indication that he is responding to internal stimuli during this assessment.  No delusions elicited during this assessment. He denies suicidal ideations. He denies homicidal ideations.  Informed patient that was still trying to get him into the [facility] treatment facility, patient says "okay "he is still wanting to go.  Patient has been compliant on the at the hospital, and compliant with medications.  Support, encouragement and reassurance provided about ongoing stressors and patient provided with opportunity for questions.  Patient does not appear to be going through any withdrawals at this time, as he is calmly laying in his bed in no acute distress. Support, encouragement and reassurance provided about ongoing stressors and patient provided with opportunity for questions.     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, polysubstance abuse, MDD   [place] Scale:  Flowsheet Row ED from 03/13/2023 in [facility] Emergency Department at [facility] ED from 03/12/2023 in [facility] Emergency Department at [facility] ED from 03/11/2023 in  [facility] Emergency Department at [facility]  C-SSRS RISK CATEGORY No Risk No Risk No Risk       Past Medical History:  Past Medical History:  Diagnosis Date   Asthma    Mood disorder    Polysubstance abuse    Schizophrenia     Past Surgical History:  Procedure Laterality Date   ANKLE SURGERY     APPLICATION OF WOUND VAC Right 11/22/2021   Procedure: APPLICATION OF WOUND VAC;  Surgeon: Knute Neu, MD;  Location: [facility];  Service: Plastics;  Laterality: Right;   APPLICATION OF WOUND VAC Right 11/25/2021   Procedure: APPLICATION OF WOUND VAC;  Surgeon: Knute Neu, MD;  [facility]: [facility];  Service: Plastics;  Laterality: Right;   APPLICATION OF WOUND VAC Right 12/07/2021   Procedure: APPLICATION OF WOUND VAC;  Surgeon: Knute Neu, MD;  [facility];  Service: Plastics;  Laterality: Right;   APPLICATION OF WOUND VAC Right 12/14/2021   Procedure: APPLICATION OF WOUND VAC;  Surgeon: [name], MD;  [facility];  Service: Plastics;  Laterality: Right;   APPLICATION OF WOUND VAC Right 12/22/2021   Procedure: WOUND VAC CHANGE;  Surgeon: [name], MD;  [facility];  Service: Plastics;  Laterality: Right;   APPLICATION OF WOUND VAC Right 12/29/2021   Procedure: APPLICATION OF WOUND VAC;  Surgeon: [name], MD;  [facility];  Service: Plastics;  Laterality: Right;   APPLICATION OF WOUND VAC Right 01/03/2022   Procedure: WOUND VAC APPLICATION;  Surgeon: [name], MD;  [facility];  Service: Plastics;  Laterality: Right;   BIOPSY  11/17/2021  Procedure: BIOPSY;  Surgeon: Vida Rigger, MD;  Location: [facility];  Service: Endoscopy;;   BUBBLE STUDY  09/30/2021   Procedure: BUBBLE STUDY;  Surgeon: Orpah Cobb, MD;  Location: [facility] ENDOSCOPY;  Service: Cardiovascular;;   DEBRIDEMENT AND CLOSURE WOUND Right 01/03/2022   Procedure: DEBRIDEMENT AND COMPLEX CLOSURE OF RIGHT ARM WOUND;  Surgeon: [name], MD;  [facility];   Service: Plastics;  Laterality: Right;   ESOPHAGOGASTRODUODENOSCOPY (EGD) WITH PROPOFOL N/A 11/17/2021   Procedure: ESOPHAGOGASTRODUODENOSCOPY (EGD) WITH PROPOFOL;  Surgeon: Vida Rigger, MD;  [facility];  Service: Endoscopy;  Laterality: N/A;   FRACTURE SURGERY     I & D EXTREMITY Right 09/27/2021   Procedure: IRRIGATION AND DEBRIDEMENT OF ABSCESS RIGHT HAND;  Surgeon: Knute Neu, MD;  [facility]: [facility];  Service: Plastics;  Laterality: Right;   I & D EXTREMITY Bilateral 11/15/2021   Procedure: IRRIGATION AND DEBRIDEMENT EXTREMITY;  Surgeon: Knute Neu, MD;  [facility];  Service: Plastics;  Laterality: Bilateral;   I & D EXTREMITY Right 11/17/2021   Procedure: IRRIGATION AND DEBRIDEMENT EXTREMITY;  Surgeon: Knute Neu, MD;  [facility];  Service: Plastics;  Laterality: Right;   I & D EXTREMITY Bilateral 12/01/2021   Procedure: INCISION AND DRAINAGE BILATERAL UPPER EXTREMITIES WITH  WOUND VAC CHANGE RIGHT;  Surgeon: Gomez Cleverly, MD;  [facility];  Service: Orthopedics;  Laterality: Bilateral;   I & D EXTREMITY Right 12/07/2021   Procedure: IRRIGATION AND DEBRIDEMENT EXTREMITY, MANIPULATION OF FINGERS UNDER ANESTHESIA;  Surgeon: Knute Neu, MD;  [facility];  Service: Plastics;  Laterality: Right;   I & D EXTREMITY Left 01/13/2023   Procedure: IRRIGATION AND DEBRIDEMENT EXTREMITY;  Surgeon: Bradly Bienenstock, MD;  [facility];  Service: Orthopedics;  Laterality: Left;   INCISION AND DRAINAGE OF WOUND Left 09/27/2021   Procedure: IRRIGATION AND DEBRIDEMENT OF ABSCESS LEFT INDEX FINGER;  Surgeon: Knute Neu, MD;  [facility]: [facility];  Service: Plastics;  Laterality: Left;   INCISION AND DRAINAGE OF WOUND Bilateral 11/22/2021   Procedure: IRRIGATION AND DEBRIDEMENT WOUND;  Surgeon: Knute Neu, MD;  [facility];  Service: Plastics;  Laterality: Bilateral;   INCISION AND DRAINAGE OF WOUND Bilateral 11/25/2021   Procedure: BILATERAL IRRIGATION AND  DEBRIDEMENT BILATERAL ARMS;  Surgeon: Knute Neu, MD;  [facility];  Service: Plastics;  Laterality: Bilateral;   INCISION AND DRAINAGE OF WOUND Right 12/14/2021   Procedure: IRRIGATION AND DEBRIDEMENT RIGHT ARM;  Surgeon: [name], MD;  [facility];  Service: Plastics;  Laterality: Right;   INCISION AND DRAINAGE OF WOUND Right 12/22/2021   Procedure: IRRIGATION AND DEBRIDEMENT ARM  WOUND;  Surgeon: [name], MD;  [facility];  Service: Plastics;  Laterality: Right;  1 hour   INCISION AND DRAINAGE OF WOUND Right 12/29/2021   Procedure: DEBRIDEMENT RIGHT ARM  WOUND;  Surgeon: [name], MD;  [facility];  Service: Plastics;  Laterality: Right;   JOINT REPLACEMENT     SCLEROTHERAPY  11/17/2021   Procedure: Susa Day;  Surgeon: Vida Rigger, MD;  [facility];  Service: Endoscopy;;   SKIN SPLIT GRAFT Right 01/03/2022   Procedure: SKIN GRAFT SPLIT THICKNESS;  Surgeon: [name], MD;  [facility];  Service: Plastics;  Laterality: Right;   TEE WITHOUT CARDIOVERSION N/A 09/30/2021   Procedure: TRANSESOPHAGEAL ECHOCARDIOGRAM (TEE);  Surgeon: Orpah Cobb, MD;  Location: [facility] ENDOSCOPY;  Service: Cardiovascular;  Laterality: N/A;   Family History: No family history on file.  Social History:  Social History   Substance and Sexual Activity  Alcohol Use Not Currently   Comment: occasionally     Social History   Substance and Sexual Activity  Drug Use Yes   Types: IV, Marijuana   Comment: heroin, meth    Social History   Socioeconomic History   Marital status: Single    Spouse name: Not on file   Number of children: Not on file   Years of education: Not on file   Highest education level: Not on file  Occupational History   Not on file  Tobacco Use   Smoking status: Some Days    Packs/day: .5    Types: Cigarettes   Smokeless tobacco: Never  Substance and Sexual Activity   Alcohol use: Not Currently    Comment:  occasionally   Drug use: Yes    Types: IV, Marijuana    Comment: heroin, meth   Sexual activity: Not on file  Other Topics Concern   Not on file  Social History Narrative   ** Merged History Encounter **       Social Determinants of Health   Financial Resource Strain: Not on file  Food Insecurity: No Food Insecurity (01/13/2023)   Hunger Vital Sign    Worried About Running Out of Food in the Last Year: Never true    Ran Out of Food in the Last Year: Never true  Transportation Needs: Unmet Transportation Needs (01/13/2023)   PRAPARE - Administrator, Civil Service (Medical): Yes    Lack of Transportation (Non-Medical): Yes  Physical Activity: Not on file  Stress: Not on file  Social Connections: Not on file    Sleep: Good  Appetite:  Good  Current Medications: Current Facility-Administered Medications  Medication Dose Route Frequency Provider Last Rate Last Admin   cloNIDine (CATAPRES) tablet 0.1 mg  0.1 mg Oral QID Motley-Mangrum, Dale Strausser A, PMHNP   0.1 mg at 03/15/23 1610   Followed by   Melene Muller ON 03/16/2023] cloNIDine (CATAPRES) tablet 0.1 mg  0.1 mg Oral BH-qamhs Motley-Mangrum, Court Gracia A, PMHNP       Followed by   Melene Muller ON 03/18/2023] cloNIDine (CATAPRES) tablet 0.1 mg  0.1 mg Oral QAC breakfast Motley-Mangrum, Rachit Grim A, PMHNP       dicyclomine (BENTYL) tablet 20 mg  20 mg Oral Q6H PRN Motley-Mangrum, Jakory Matsuo A, PMHNP       gabapentin (NEURONTIN) capsule 300 mg  300 mg Oral TID Motley-Mangrum, Hairo Garraway A, PMHNP   300 mg at 03/15/23 0906   hydrOXYzine (ATARAX) tablet 25 mg  25 mg Oral Q6H PRN Motley-Mangrum, Xzayvier Fagin A, PMHNP   25 mg at 03/15/23 0906   loperamide (IMODIUM) capsule 2-4 mg  2-4 mg Oral PRN Motley-Mangrum, Doren Kaspar A, PMHNP       methocarbamol (ROBAXIN) tablet 500 mg  500 mg Oral Q8H PRN Motley-Mangrum, Varie Machamer A, PMHNP       ondansetron (ZOFRAN-ODT) disintegrating tablet 4 mg  4 mg Oral Q6H PRN Motley-Mangrum, Melynda Krzywicki A, PMHNP       QUEtiapine (SEROQUEL XR)  24 hr tablet 300 mg  300 mg Oral QHS Motley-Mangrum, Devantae Babe A, PMHNP   300 mg at 03/14/23 2028   Current Outpatient Medications  Medication Sig Dispense Refill   albuterol (VENTOLIN HFA) 108 (90 Base) MCG/ACT inhaler Inhale 2 puffs into the lungs every 6 (six) hours as needed for wheezing or shortness of breath.     buprenorphine-naloxone (SUBOXONE) 8-2 mg SUBL SL tablet Place 1 tablet under the  tongue 2 (two) times daily for 7 days 14 tablet 0   cyclobenzaprine (FLEXERIL) 10 MG tablet Take 10 mg by mouth 3 (three) times daily as needed for muscle spasms.     fluticasone (FLONASE ALLERGY RELIEF) 50 MCG/ACT nasal spray Place 1 spray into both nostrils in the morning and at bedtime.     folic acid (FOLVITE) 1 MG tablet Take 1 tablet (1 mg total) by mouth daily. 90 tablet 0   gabapentin (NEURONTIN) 300 MG capsule Take 1 capsule (300 mg total) by mouth 3 (three) times daily. 90 capsule 0   hydrOXYzine (ATARAX) 10 MG tablet Take 1 tablet (10 mg total) by mouth every 6 (six) hours as needed for anxiety. (Patient taking differently: Take 10 mg by mouth 3 (three) times daily.) 30 tablet 0   ibuprofen (ADVIL) 200 MG tablet Take 600-800 mg by mouth every 6 (six) hours as needed for mild pain or headache.     naproxen sodium (ALEVE) 220 MG tablet Take 220-440 mg by mouth 2 (two) times daily as needed (for pain or headaches).     OPCON-A 0.027-0.315 % SOLN Place 1 drop into both eyes 3 (three) times daily as needed (for itching).     pantoprazole (PROTONIX) 40 MG tablet Take 1 tablet (40 mg total) by mouth daily. (Patient taking differently: Take 40 mg by mouth daily before breakfast.) 30 tablet 0   polyethylene glycol powder (MIRALAX) 17 GM/SCOOP powder Take 17 g by mouth daily as needed (constipation). (Patient taking differently: Take 17 g by mouth daily as needed for mild constipation.) 255 g 0   QUEtiapine (SEROQUEL XR) 300 MG 24 hr tablet Take 1 tablet (300 mg total) by mouth at bedtime. 30 tablet 0    thiamine (VITAMIN B-1) 100 MG tablet Take 1 tablet (100 mg total) by mouth daily 100 tablet 3   ZYRTEC ALLERGY 10 MG tablet Take 10 mg by mouth at bedtime.     naloxone (NARCAN) nasal spray 4 mg/0.1 mL Take as needed for Narcotic overdose. (Patient not taking: Reported on 03/14/2023) 1 each 0   senna (SENOKOT) 8.6 MG TABS tablet Take 2 tablets (17.2 mg total) by mouth at bedtime. (Patient not taking: Reported on 03/14/2023) 120 tablet 0    Lab Results:  Results for orders placed or performed during the hospital encounter of 03/13/23 (from the past 48 hour(s))  Resp panel by RT-PCR (RSV, Flu A&B, Covid) Anterior Nasal Swab     Status: None   Collection Time: 03/14/23  4:44 AM   Specimen: Anterior Nasal Swab  Result Value Ref Range   SARS Coronavirus 2 by RT PCR NEGATIVE NEGATIVE    Comment: (NOTE) SARS-CoV-2 target nucleic acids are NOT DETECTED.  The SARS-CoV-2 RNA is generally detectable in upper respiratory specimens during the acute phase of infection. The lowest concentration of SARS-CoV-2 viral copies this assay can detect is 138 copies/mL. A negative result does not preclude SARS-Cov-2 infection and should not be used as the sole basis for treatment or other patient management decisions. A negative result may occur with  improper specimen collection/handling, submission of specimen other than nasopharyngeal swab, presence of viral mutation(s) within the areas targeted by this assay, and inadequate number of viral copies(<138 copies/mL). A negative result must be combined with clinical observations, patient history, and epidemiological information. The expected result is Negative.  Fact Sheet for Patients:  BloggerCourse.com  Fact Sheet for Healthcare Providers:  SeriousBroker.it  This test is no t yet approved  or cleared by the [place] FDA and  has been authorized for detection and/or diagnosis of SARS-CoV-2 by FDA under  an Emergency Use Authorization (EUA). This EUA will remain  in effect (meaning this test can be used) for the duration of the COVID-19 declaration under Section 564(b)(1) of the Act, 21 U.S.C.section 360bbb-3(b)(1), unless the authorization is terminated  or revoked sooner.       Influenza A by PCR NEGATIVE NEGATIVE   Influenza B by PCR NEGATIVE NEGATIVE    Comment: (NOTE) The Xpert Xpress SARS-CoV-2/FLU/RSV plus assay is intended as an aid in the diagnosis of influenza from Nasopharyngeal swab specimens and should not be used as a sole basis for treatment. Nasal washings and aspirates are unacceptable for Xpert Xpress SARS-CoV-2/FLU/RSV testing.  Fact Sheet for Patients: BloggerCourse.comhttps://www.fda.gov/media/152166/download  Fact Sheet for Healthcare Providers: SeriousBroker.ithttps://www.fda.gov/media/152162/download  This test is not yet approved or cleared by the [place] FDA and has been authorized for detection and/or diagnosis of SARS-CoV-2 by FDA under an Emergency Use Authorization (EUA). This EUA will remain in effect (meaning this test can be used) for the duration of the COVID-19 declaration under Section 564(b)(1) of the Act, 21 U.S.C. section 360bbb-3(b)(1), unless the authorization is terminated or revoked.     Resp Syncytial Virus by PCR NEGATIVE NEGATIVE    Comment: (NOTE) Fact Sheet for Patients: BloggerCourse.comhttps://www.fda.gov/media/152166/download  Fact Sheet for Healthcare Providers: SeriousBroker.ithttps://www.fda.gov/media/152162/download  This test is not yet approved or cleared by the [place] FDA and has been authorized for detection and/or diagnosis of SARS-CoV-2 by FDA under an Emergency Use Authorization (EUA). This EUA will remain in effect (meaning this test can be used) for the duration of the COVID-19 declaration under Section 564(b)(1) of the Act, 21 U.S.C. section 360bbb-3(b)(1), unless the authorization is terminated or revoked.  Performed at [facility], [address] [place], [place] [address]     Blood Alcohol level:  Lab Results  Component Value Date   [facility] <10 03/12/2023   ETH <10 03/11/2023    Physical Findings:  CIWA:    COWS:     Musculoskeletal: Strength & Muscle Tone: within normal limits Gait & Station: normal Patient leans: N/A  Psychiatric Specialty Exam:  Presentation  General Appearance:  Appropriate for Environment  Eye Contact: Minimal  Speech: Clear and Coherent  Speech Volume: Normal  Handedness:No data recorded  Mood and Affect  Mood: Euthymic  Affect: Appropriate   Thought Process  Thought Processes: Coherent  Descriptions of Associations:Intact  Orientation:Full (Time, Place and Person)  Thought Content:WDL  History of Schizophrenia/Schizoaffective disorder:No data recorded Duration of Psychotic Symptoms:No data recorded Hallucinations:Hallucinations: None  Ideas of Reference:None  Suicidal Thoughts:Suicidal Thoughts: No  Homicidal Thoughts:Homicidal Thoughts: No   Sensorium  Memory: Immediate Fair; Remote Fair  Judgment: Fair  Insight: Fair   Art therapistxecutive Functions  Concentration: Fair  Attention Span: Fair  Recall: Fair  Fund of Knowledge: Fair  Language: Good   Psychomotor Activity  Psychomotor Activity: Psychomotor Activity: Normal   Assets  Assets: Communication Skills; Physical Health; Desire for Improvement; Social Support   Sleep  Sleep: Sleep: Good    Physical Exam: Physical Exam HENT:     Nose: Nose normal.  Musculoskeletal:     Cervical back: Normal range of motion.  Neurological:     Mental Status: He is alert.  Psychiatric:        Mood and Affect: Mood normal.        Behavior: Behavior normal.  Thought Content: Thought content normal.        Judgment: Judgment normal.    Review of Systems  Psychiatric/Behavioral:  Positive for substance abuse.    Blood pressure 102/61, pulse 81, temperature 97.6 F (36.4  C), temperature source Oral, resp. rate 16, SpO2 100 %. There is no height or weight on file to calculate BMI.   Medical Decision Making: Patient continues to require detox treatment. Patient accepted to [facility] to detox from suboxone today, pending discharges.     Tashon Capp MOTLEY-MANGRUM, PMHNP 03/15/2023, 12:24 PM

## 2023-03-15 NOTE — ED Notes (Signed)
Rechecked patient pulse it was 72

## 2023-03-15 NOTE — ED Notes (Signed)
Patient is watching tv in the dayroom. Environment secured. Alert and oriented. Will continue to monitor for safety. 

## 2023-03-15 NOTE — ED Notes (Signed)
Patient blood pressure was 96/54 pulse 140 rr18 oxygen was 100%. Provider states to hold clonidine. Will continue to monitor for safety.

## 2023-03-15 NOTE — ED Notes (Signed)
Patients blood pressure was 96/58 Denies any s/s of hypotension.

## 2023-03-15 NOTE — ED Notes (Signed)
Pt is in the bed resting. Respirations are even and unlabored. No acute distress noted. Will continue to monitor for safety 

## 2023-03-15 NOTE — ED Notes (Signed)
Patient off unit to [facility] per provider. Patient alert, calm, cooperative, no s/s of distress. Patient discharge information and belongings given to Safe  Transport for facility. Patient ambulatory off unit, escorted by NT. Patient transported by [employer].

## 2023-03-15 NOTE — ED Notes (Signed)
Patient is sleeping. Respirations equal and unlabored, skin warm and dry. No change in assessment or acuity. Routine safety checks conducted according to facility protocol. Will continue to monitor for safety.   

## 2023-03-15 NOTE — ED Notes (Signed)
Patient was provided with dinner 

## 2023-03-15 NOTE — ED Notes (Signed)
Patient to room 34.  Patient ambulated to room.  Cooperative.  Patient oriented to room and unit.

## 2023-03-16 ENCOUNTER — Encounter (HOSPITAL_COMMUNITY): Payer: Self-pay | Admitting: Family Medicine

## 2023-03-16 DIAGNOSIS — F1520 Other stimulant dependence, uncomplicated: Secondary | ICD-10-CM | POA: Diagnosis not present

## 2023-03-16 LAB — COMPREHENSIVE METABOLIC PANEL
ALT: 39 U/L (ref 0–44)
AST: 39 U/L (ref 15–41)
Albumin: 3.8 g/dL (ref 3.5–5.0)
Alkaline Phosphatase: 127 U/L — ABNORMAL HIGH (ref 38–126)
Anion gap: 9 (ref 5–15)
BUN: 15 mg/dL (ref 6–20)
CO2: 23 mmol/L (ref 22–32)
Calcium: 9.1 mg/dL (ref 8.9–10.3)
Chloride: 103 mmol/L (ref 98–111)
Creatinine, Ser: 0.59 mg/dL — ABNORMAL LOW (ref 0.61–1.24)
GFR, Estimated: >60 mL/min (ref >60)
Glucose, Bld: 93 mg/dL (ref 70–99)
Potassium: 4.2 mmol/L (ref 3.5–5.1)
Sodium: 135 mmol/L (ref 135–145)
Total Bilirubin: 0.6 mg/dL (ref 0.3–1.2)
Total Protein: 7 g/dL (ref 6.5–8.1)

## 2023-03-16 LAB — CBC WITH DIFFERENTIAL/PLATELET
Abs Immature Granulocytes: 0.02 10*3/uL (ref 0.00–0.07)
Basophils Absolute: 0.1 10*3/uL (ref 0.0–0.1)
Basophils Relative: 1 %
Eosinophils Absolute: 0.8 10*3/uL — ABNORMAL HIGH (ref 0.0–0.5)
Eosinophils Relative: 9 %
HCT: 44.4 % (ref 39.0–52.0)
Hemoglobin: 14.6 g/dL (ref 13.0–17.0)
Immature Granulocytes: 0 %
Lymphocytes Relative: 34 %
Lymphs Abs: 2.8 10*3/uL (ref 0.7–4.0)
MCH: 27.7 pg (ref 26.0–34.0)
MCHC: 32.9 g/dL (ref 30.0–36.0)
MCV: 84.3 fL (ref 80.0–100.0)
Monocytes Absolute: 0.7 10*3/uL (ref 0.1–1.0)
Monocytes Relative: 8 %
Neutro Abs: 3.9 10*3/uL (ref 1.7–7.7)
Neutrophils Relative %: 48 %
Platelets: 446 10*3/uL — ABNORMAL HIGH (ref 150–400)
RBC: 5.27 MIL/uL (ref 4.22–5.81)
RDW: 13.4 % (ref 11.5–15.5)
WBC: 8.2 10*3/uL (ref 4.0–10.5)
nRBC: 0 % (ref 0.0–0.2)

## 2023-03-16 NOTE — Discharge Instructions (Signed)
[facility]: [phone]: Ask about specific location [facility]: [phone] [facility]: Male and Male facility; [phone] [facility] in [place], [place]: [phone] [facility]: Rhae Hammock Admissions Coordinator [phone] [facility] in [place], [place] [phone] or [phone] [facility]: Main: [phone]   [facility] www.oxfordvacancies.com   12 STEP PROGRAMS:   Alcoholics Anonymous of [place] SoftwareChalet.be   Narcotics Anonymous of [place] HitProtect.dk   Al-Anon of [place], [place] www.[place].org/find-meetings.html   Nar-Anon https://nar-anon.org/find-a-meetin

## 2023-03-16 NOTE — ED Notes (Signed)
Patient was provided with dinner 

## 2023-03-16 NOTE — ED Notes (Signed)
Patient is sleeping. Respirations equal and unlabored, skin warm and dry. No change in assessment or acuity. Routine safety checks conducted according to facility protocol. Will continue to monitor for safety.   

## 2023-03-16 NOTE — ED Notes (Signed)
Pt is in the dayroom watching TV with peers. Pt denies SI/HI/AVH. No acute distress noted. Will continue to monitor for safety. 

## 2023-03-16 NOTE — ED Notes (Signed)
Patient is watching tv in the dayroom. Environment secured. Alert and oriented. Will continue to monitor for safety. 

## 2023-03-16 NOTE — ED Notes (Signed)
Patient in room. Environment is secured. Will continue to monitor for safety. 

## 2023-03-16 NOTE — ED Notes (Signed)
SPIRITUALITY GROUP NOTE  Spirituality group facilitated by Wilkie Aye, MDiv, BCC.  Group Description: Group focused on topic of hope. Patients participated in facilitated discussion around topic, connecting with one another around experiences and definitions for hope. Group members engaged with visual explorer photos, reflecting on what hope looks like for them today. Group engaged in discussion around how their definitions of hope are present today in hospital.  Modalities: Psycho-social ed, Adlerian, Narrative, MI  Patient Progress: Jason Buchanan was engaged throughout group.  Spoke of hope as looking toward the future and connected with the idea of prospective.  Named his faith as a resource in this - stating that he steps back from a situation and asks "what would jesus do."  He expressed recognition that no situation is perfect and that he needs to exercise acceptance.

## 2023-03-16 NOTE — ED Notes (Signed)
Pt requested medication for anxiety. Hydroxyzine 50mg  was administered to the pt by Clinical research associate.

## 2023-03-16 NOTE — Tx Team (Signed)
LCSW and MD spoke with patient his morning regarding admission. Patient reports he presented because he needed assistance with getting off of suboxone in order to be considered for [facility] in [place]. Patient reports he has been off of the suboxone for about 3 days now and has not reported any adverse side effects. Patient reports he is also willing to consider the [facility] and [address] in [place]. Patient reports he is divorced and has been trying to get himself help in order to continue seeing his children. Patient reports he has an 54 and 37 year old that is currently living with his ex-wife. Patient reports ex-wife is supportive and reports limited support from his brother that lives in [place], [place]. Patient denies having any legal charges or upcoming court dates. Patient was provided the contact information for [facility] and the [employer] for [facility]. Patient reports he will begin making calls now in order to secure placement for himself. No other needs were reported at this time. Additional resources to be provided in AVS.    Fernande Boyden, LCSW Clinical Social Worker [place] Ph: [phone]

## 2023-03-16 NOTE — ED Notes (Signed)
Patient was provided with lunch 

## 2023-03-16 NOTE — ED Notes (Signed)
Patient alert and oriented x 3. Denies SI/HI/AVH. Denies intent or plan to harm self or others. Routine conducted according to faculty protocol. Encourage patient to notify staff with any needs or concerns. Patient verbalized agreement and understanding. Will continue to monitor for safety. 

## 2023-03-16 NOTE — ED Notes (Signed)
Pt is in the bed awake. Respirations are even and unlabored. No acute distress noted. Will continue to monitor for safety. 

## 2023-03-16 NOTE — ED Provider Notes (Signed)
Behavioral Health Progress Note  Date and Time: 03/16/2023 10:06 AM Name: Jason Buchanan MRN:  [id number]  Subjective:  Casimiro Needle stated " I doing okay."  Mayo Ao was seen and evaluated face-to-face by this provider.  He presents with a bright and pleasant affect.  Denying suicidal or homicidal ideations.  Denies auditory visual hallucinations.  Reports he has plans to follow-up with [facility] [place] in [place].  However, states he was hopeful to get into a sober living in [place].  Patient has been compliant with detox protocol.  UDS+= for amphetamines and THC. Will repeat urinalysis.  He reports a good appetite.  States he  is not resting well throughout the night.  States his trazodone was not restarted so he is hopeful that his sleep will improve since his medication has been restarted.  He denied cravings for amphetamines.   Therman denied headaches nausea vomiting or tremors currently.  Reports he has been compliant with medication denying any medication side effects.  Laura Radilla is sitting ; she is alert/oriented x 4; calm/cooperative; and mood congruent with affect.  Patient is speaking in a clear tone at moderate volume, and normal pace; with good eye contact. His thought process is coherent and relevant; There is no indication that he is currently responding to internal/external stimuli or experiencing delusional thought content.  Patient denies suicidal/self-harm/homicidal ideation, psychosis, and paranoia.  Patient has remained calm throughout assessment and has answered questions appropriately.   Diagnosis:  Final diagnoses:  Methamphetamine use disorder, severe, dependence    Total Time spent with patient: 15 minutes    Additional Social History:        Sleep: Good  Appetite:  Fair  Current Medications:  Current Facility-Administered Medications  Medication Dose Route Frequency Provider Last Rate Last Admin   alum & mag hydroxide-simeth  (MAALOX/MYLANTA) 200-200-20 MG/5ML suspension 30 mL  30 mL Oral Q4H PRN Bing Neighbors, NP       cloNIDine (CATAPRES) tablet 0.1 mg  0.1 mg Oral QID Bing Neighbors, NP   0.1 mg at 03/16/23 4098   Followed by   Melene Muller ON 03/18/2023] cloNIDine (CATAPRES) tablet 0.1 mg  0.1 mg Oral Jacinto Halim, NP       Followed by   Melene Muller ON 03/20/2023] cloNIDine (CATAPRES) tablet 0.1 mg  0.1 mg Oral QAC breakfast Bing Neighbors, NP       dicyclomine (BENTYL) tablet 20 mg  20 mg Oral Q6H PRN Bing Neighbors, NP       gabapentin (NEURONTIN) capsule 300 mg  300 mg Oral TID Bing Neighbors, NP   300 mg at 03/16/23 0917   hydrOXYzine (ATARAX) tablet 50 mg  50 mg Oral Q6H PRN Bing Neighbors, NP   50 mg at 03/16/23 1191   loperamide (IMODIUM) capsule 2-4 mg  2-4 mg Oral PRN Bing Neighbors, NP       magnesium hydroxide (MILK OF MAGNESIA) suspension 30 mL  30 mL Oral Daily PRN Bing Neighbors, NP       methocarbamol (ROBAXIN) tablet 500 mg  500 mg Oral Q8H PRN Bing Neighbors, NP   500 mg at 03/15/23 1950   naproxen (NAPROSYN) tablet 500 mg  500 mg Oral BID PRN Bing Neighbors, NP       nicotine (NICODERM CQ - dosed in mg/24 hours) patch 21 mg  21 mg Transdermal Daily Bing Neighbors, NP   21 mg at 03/16/23  0917   ondansetron (ZOFRAN-ODT) disintegrating tablet 4 mg  4 mg Oral Q6H PRN Bing Neighbors, NP       QUEtiapine (SEROQUEL XR) 24 hr tablet 300 mg  300 mg Oral QHS Bing Neighbors, NP       traZODone (DESYREL) tablet 50 mg  50 mg Oral QHS Bing Neighbors, NP   50 mg at 03/15/23 2111   Current Outpatient Medications  Medication Sig Dispense Refill   albuterol (VENTOLIN HFA) 108 (90 Base) MCG/ACT inhaler Inhale 2 puffs into the lungs every 6 (six) hours as needed for wheezing or shortness of breath.     buprenorphine-naloxone (SUBOXONE) 8-2 mg SUBL SL tablet Place 1 tablet under the tongue 2 (two) times daily for 7 days 14 tablet 0   cyclobenzaprine  (FLEXERIL) 10 MG tablet Take 10 mg by mouth 3 (three) times daily as needed for muscle spasms.     fluticasone (FLONASE ALLERGY RELIEF) 50 MCG/ACT nasal spray Place 1 spray into both nostrils in the morning and at bedtime.     folic acid (FOLVITE) 1 MG tablet Take 1 tablet (1 mg total) by mouth daily. 90 tablet 0   gabapentin (NEURONTIN) 300 MG capsule Take 1 capsule (300 mg total) by mouth 3 (three) times daily. 90 capsule 0   hydrOXYzine (ATARAX) 10 MG tablet Take 1 tablet (10 mg total) by mouth every 6 (six) hours as needed for anxiety. (Patient taking differently: Take 10 mg by mouth 3 (three) times daily.) 30 tablet 0   ibuprofen (ADVIL) 200 MG tablet Take 600-800 mg by mouth every 6 (six) hours as needed for mild pain or headache.     naloxone (NARCAN) nasal spray 4 mg/0.1 mL Take as needed for Narcotic overdose. (Patient not taking: Reported on 03/14/2023) 1 each 0   naproxen sodium (ALEVE) 220 MG tablet Take 220-440 mg by mouth 2 (two) times daily as needed (for pain or headaches).     OPCON-A 0.027-0.315 % SOLN Place 1 drop into both eyes 3 (three) times daily as needed (for itching).     pantoprazole (PROTONIX) 40 MG tablet Take 1 tablet (40 mg total) by mouth daily. (Patient taking differently: Take 40 mg by mouth daily before breakfast.) 30 tablet 0   polyethylene glycol powder (MIRALAX) 17 GM/SCOOP powder Take 17 g by mouth daily as needed (constipation). (Patient taking differently: Take 17 g by mouth daily as needed for mild constipation.) 255 g 0   QUEtiapine (SEROQUEL XR) 300 MG 24 hr tablet Take 1 tablet (300 mg total) by mouth at bedtime. 30 tablet 0   senna (SENOKOT) 8.6 MG TABS tablet Take 2 tablets (17.2 mg total) by mouth at bedtime. (Patient not taking: Reported on 03/14/2023) 120 tablet 0   thiamine (VITAMIN B-1) 100 MG tablet Take 1 tablet (100 mg total) by mouth daily 100 tablet 3   ZYRTEC ALLERGY 10 MG tablet Take 10 mg by mouth at bedtime.      Labs  Lab Results:   Admission on 03/15/2023  Component Date Value Ref Range Status   WBC 03/16/2023 8.2  4.0 - 10.5 K/uL Final   RBC 03/16/2023 5.27  4.22 - 5.81 MIL/uL Final   Hemoglobin 03/16/2023 14.6  13.0 - 17.0 g/dL Final   HCT 50/53/9767 44.4  39.0 - 52.0 % Final   MCV 03/16/2023 84.3  80.0 - 100.0 fL Final   MCH 03/16/2023 27.7  26.0 - 34.0 pg Final   MCHC 03/16/2023 32.9  30.0 - 36.0 g/dL Final   RDW 09/81/1914 13.4  11.5 - 15.5 % Final   Platelets 03/16/2023 446 (H)  150 - 400 K/uL Final   nRBC 03/16/2023 0.0  0.0 - 0.2 % Final   Neutrophils Relative % 03/16/2023 48  % Final   Neutro Abs 03/16/2023 3.9  1.7 - 7.7 K/uL Final   Lymphocytes Relative 03/16/2023 34  % Final   Lymphs Abs 03/16/2023 2.8  0.7 - 4.0 K/uL Final   Monocytes Relative 03/16/2023 8  % Final   Monocytes Absolute 03/16/2023 0.7  0.1 - 1.0 K/uL Final   Eosinophils Relative 03/16/2023 9  % Final   Eosinophils Absolute 03/16/2023 0.8 (H)  0.0 - 0.5 K/uL Final   Basophils Relative 03/16/2023 1  % Final   Basophils Absolute 03/16/2023 0.1  0.0 - 0.1 K/uL Final   Immature Granulocytes 03/16/2023 0  % Final   Abs Immature Granulocytes 03/16/2023 0.02  0.00 - 0.07 K/uL Final   Performed at [facility], [address] [place], [place] [address]   Sodium 03/16/2023 135  135 - 145 mmol/L Final   Potassium 03/16/2023 4.2  3.5 - 5.1 mmol/L Final   Chloride 03/16/2023 103  98 - 111 mmol/L Final   CO2 03/16/2023 23  22 - 32 mmol/L Final   Glucose, Bld 03/16/2023 93  70 - 99 mg/dL Final   Glucose reference range applies only to samples taken after fasting for at least 8 hours.   BUN 03/16/2023 15  6 - 20 mg/dL Final   Creatinine, Ser 03/16/2023 0.59 (L)  0.61 - 1.24 mg/dL Final   Calcium 62/13/0865 9.1  8.9 - 10.3 mg/dL Final   Total Protein 78/46/9629 7.0  6.5 - 8.1 g/dL Final   Albumin 52/84/1324 3.8  3.5 - 5.0 g/dL Final   AST 40/09/2724 39  15 - 41 U/L Final   ALT 03/16/2023 39  0 - 44 U/L Final   Alkaline Phosphatase  03/16/2023 127 (H)  38 - 126 U/L Final   Total Bilirubin 03/16/2023 0.6  0.3 - 1.2 mg/dL Final   GFR, Estimated 03/16/2023 >60  >60 mL/min Final   Comment: (NOTE) Calculated using the CKD-EPI Creatinine Equation (2021)    Anion gap 03/16/2023 9  5 - 15 Final   Performed at [facility], [address], [place], [place] [address]  Admission on 03/13/2023, Discharged on 03/15/2023  Component Date Value Ref Range Status   SARS Coronavirus 2 by RT PCR 03/14/2023 NEGATIVE  NEGATIVE Final   Comment: (NOTE) SARS-CoV-2 target nucleic acids are NOT DETECTED.  The SARS-CoV-2 RNA is generally detectable in upper respiratory specimens during the acute phase of infection. The lowest concentration of SARS-CoV-2 viral copies this assay can detect is 138 copies/mL. A negative result does not preclude SARS-Cov-2 infection and should not be used as the sole basis for treatment or other patient management decisions. A negative result may occur with  improper specimen collection/handling, submission of specimen other than nasopharyngeal swab, presence of viral mutation(s) within the areas targeted by this assay, and inadequate number of viral copies(<138 copies/mL). A negative result must be combined with clinical observations, patient history, and epidemiological information. The expected result is Negative.  Fact Sheet for Patients:  BloggerCourse.com  Fact Sheet for Healthcare Providers:  SeriousBroker.it  This test is no  t yet approved or cleared by the [place] and  has been authorized for detection and/or diagnosis of SARS-CoV-2 by FDA under an Emergency Use Authorization (EUA). This EUA will remain  in effect (meaning this test can be used) for the duration of the COVID-19 declaration under Section 564(b)(1) of the Act, 21 U.S.C.section 360bbb-3(b)(1), unless the authorization is terminated  or revoked  sooner.       Influenza A by PCR 03/14/2023 NEGATIVE  NEGATIVE Final   Influenza B by PCR 03/14/2023 NEGATIVE  NEGATIVE Final   Comment: (NOTE) The Xpert Xpress SARS-CoV-2/FLU/RSV plus assay is intended as an aid in the diagnosis of influenza from Nasopharyngeal swab specimens and should not be used as a sole basis for treatment. Nasal washings and aspirates are unacceptable for Xpert Xpress SARS-CoV-2/FLU/RSV testing.  Fact Sheet for Patients: BloggerCourse.com  Fact Sheet for Healthcare Providers: SeriousBroker.it  This test is not yet approved or cleared by the [place] FDA and has been authorized for detection and/or diagnosis of SARS-CoV-2 by FDA under an Emergency Use Authorization (EUA). This EUA will remain in effect (meaning this test can be used) for the duration of the COVID-19 declaration under Section 564(b)(1) of the Act, 21 U.S.C. section 360bbb-3(b)(1), unless the authorization is terminated or revoked.     Resp Syncytial Virus by PCR 03/14/2023 NEGATIVE  NEGATIVE Final   Comment: (NOTE) Fact Sheet for Patients: BloggerCourse.com  Fact Sheet for Healthcare Providers: SeriousBroker.it  This test is not yet approved or cleared by the [place] FDA and has been authorized for detection and/or diagnosis of SARS-CoV-2 by FDA under an Emergency Use Authorization (EUA). This EUA will remain in effect (meaning this test can be used) for the duration of the COVID-19 declaration under Section 564(b)(1) of the Act, 21 U.S.C. section 360bbb-3(b)(1), unless the authorization is terminated or revoked.  Performed at [facility], [address], [place], [place] [address]   Admission on 03/12/2023, Discharged on 03/13/2023  Component Date Value Ref Range Status   Sodium 03/12/2023 139  135 - 145 mmol/L Final   Potassium 03/12/2023 3.8  3.5 -  5.1 mmol/L Final   Chloride 03/12/2023 110  98 - 111 mmol/L Final   CO2 03/12/2023 21 (L)  22 - 32 mmol/L Final   Glucose, Bld 03/12/2023 113 (H)  70 - 99 mg/dL Final   Glucose reference range applies only to samples taken after fasting for at least 8 hours.   BUN 03/12/2023 14  6 - 20 mg/dL Final   Creatinine, Ser 03/12/2023 0.77  0.61 - 1.24 mg/dL Final   Calcium 47/82/9562 8.7 (L)  8.9 - 10.3 mg/dL Final   Total Protein 13/07/6577 6.0 (L)  6.5 - 8.1 g/dL Final   Albumin 46/96/2952 3.4 (L)  3.5 - 5.0 g/dL Final   AST 84/13/2440 57 (H)  15 - 41 U/L Final   ALT 03/12/2023 42  0 - 44 U/L Final   Alkaline Phosphatase 03/12/2023 112  38 - 126 U/L Final   Total Bilirubin 03/12/2023 0.5  0.3 - 1.2 mg/dL Final   GFR, Estimated 03/12/2023 >60  >60 mL/min Final   Comment: (NOTE) Calculated using the CKD-EPI Creatinine Equation (2021)    Anion gap 03/12/2023 8  5 - 15 Final   Performed at [facility], [address], [place], [place] [address]   Alcohol, Ethyl (B) 03/12/2023 <10  <10 mg/dL Final   Comment: (NOTE) Lowest detectable limit for serum alcohol  is 10 mg/dL.  For medical purposes only. Performed at [facility], [address], [place], [place] [address]    Opiates 03/12/2023 NONE DETECTED  NONE DETECTED Final   Cocaine 03/12/2023 NONE DETECTED  NONE DETECTED Final   Benzodiazepines 03/12/2023 NONE DETECTED  NONE DETECTED Final   Amphetamines 03/12/2023 POSITIVE (A)  NONE DETECTED Final   Tetrahydrocannabinol 03/12/2023 POSITIVE (A)  NONE DETECTED Final   Barbiturates 03/12/2023 NONE DETECTED  NONE DETECTED Final   Comment: (NOTE) DRUG SCREEN FOR MEDICAL PURPOSES ONLY.  IF CONFIRMATION IS NEEDED FOR ANY PURPOSE, NOTIFY LAB WITHIN 5 DAYS.  LOWEST DETECTABLE LIMITS FOR URINE DRUG SCREEN Drug Class                     Cutoff (ng/mL) Amphetamine and metabolites    1000 Barbiturate and metabolites    200 Benzodiazepine                 200 Opiates and metabolites         300 Cocaine and metabolites        300 THC                            50 Performed at [facility], [address] [place], [place] [address]    WBC 03/12/2023 7.4  4.0 - 10.5 K/uL Final   RBC 03/12/2023 3.82 (L)  4.22 - 5.81 MIL/uL Final   Hemoglobin 03/12/2023 10.8 (L)  13.0 - 17.0 g/dL Final   HCT 09/81/1914 32.7 (L)  39.0 - 52.0 % Final   MCV 03/12/2023 85.6  80.0 - 100.0 fL Final   MCH 03/12/2023 28.3  26.0 - 34.0 pg Final   MCHC 03/12/2023 33.0  30.0 - 36.0 g/dL Final   RDW 78/29/5621 13.6  11.5 - 15.5 % Final   Platelets 03/12/2023 353  150 - 400 K/uL Final   nRBC 03/12/2023 0.0  0.0 - 0.2 % Final   Neutrophils Relative % 03/12/2023 66  % Final   Neutro Abs 03/12/2023 4.9  1.7 - 7.7 K/uL Final   Lymphocytes Relative 03/12/2023 19  % Final   Lymphs Abs 03/12/2023 1.4  0.7 - 4.0 K/uL Final   Monocytes Relative 03/12/2023 7  % Final   Monocytes Absolute 03/12/2023 0.5  0.1 - 1.0 K/uL Final   Eosinophils Relative 03/12/2023 7  % Final   Eosinophils Absolute 03/12/2023 0.6 (H)  0.0 - 0.5 K/uL Final   Basophils Relative 03/12/2023 1  % Final   Basophils Absolute 03/12/2023 0.1  0.0 - 0.1 K/uL Final   Immature Granulocytes 03/12/2023 0  % Final   Abs Immature Granulocytes 03/12/2023 0.01  0.00 - 0.07 K/uL Final   Performed at [facility], [address] [place], [place] [address]   Salicylate Lvl 03/12/2023 <7.0 (L)  7.0 - 30.0 mg/dL Final   Performed at [facility], [address] [place], [place] [address]   Acetaminophen (Tylenol), Serum 03/12/2023 <10 (L)  10 - 30 ug/mL Final   Comment: (NOTE) Therapeutic concentrations vary significantly. A range of 10-30 ug/mL  may be an effective concentration for many patients. However, some  are best treated at concentrations outside of this range. Acetaminophen concentrations >150 ug/mL at 4 hours after ingestion  and >50 ug/mL at 12 hours after ingestion are often associated with  toxic  reactions.  Performed at [facility], [address] [place], [place] [address]    Lactic Acid, Venous 03/12/2023 1.5  0.5 - 1.9 mmol/L Final   Performed at [facility], [address], [place], [place] [address]   Color, Urine 03/12/2023 YELLOW  YELLOW Final   APPearance 03/12/2023 HAZY (A)  CLEAR Final   Specific Gravity, Urine 03/12/2023 1.027  1.005 - 1.030 Final   pH 03/12/2023 5.0  5.0 - 8.0 Final   Glucose, UA 03/12/2023 NEGATIVE  NEGATIVE mg/dL Final   Hgb urine dipstick 03/12/2023 MODERATE (A)  NEGATIVE Final   Bilirubin Urine 03/12/2023 SMALL (A)  NEGATIVE Final   Ketones, ur 03/12/2023 5 (A)  NEGATIVE mg/dL Final   Protein, ur 09/81/1914 30 (A)  NEGATIVE mg/dL Final   Nitrite 78/29/5621 NEGATIVE  NEGATIVE Final   Leukocytes,Ua 03/12/2023 SMALL (A)  NEGATIVE Final   RBC / HPF 03/12/2023 >50  0 - 5 RBC/hpf Final   WBC, UA 03/12/2023 21-50  0 - 5 WBC/hpf Final   Bacteria, UA 03/12/2023 MANY (A)  NONE SEEN Final   Squamous Epithelial / HPF 03/12/2023 0-5  0 - 5 /HPF Final   Mucus 03/12/2023 PRESENT   Final   Ca Oxalate Crys, UA 03/12/2023 PRESENT   Final   Performed at [facility], [address] [place], [place] [address]  Admission on 03/11/2023, Discharged on 03/12/2023  Component Date Value Ref Range Status   Lipase 03/11/2023 23  11 - 51 U/L Final   Performed at [facility], [address] [place], [place] [address]   Sodium 03/11/2023 139  135 - 145 mmol/L Final   Potassium 03/11/2023 3.0 (L)  3.5 - 5.1 mmol/L Final   Chloride 03/11/2023 108  98 - 111 mmol/L Final   CO2 03/11/2023 22  22 - 32 mmol/L Final   Glucose, Bld 03/11/2023 116 (H)  70 - 99 mg/dL Final   Glucose reference range applies only to samples taken after fasting for at least 8 hours.   BUN 03/11/2023 13  6 - 20 mg/dL Final   Creatinine, Ser 03/11/2023 0.63  0.61 - 1.24 mg/dL Final   Calcium 62/95/2841 8.5 (L)  8.9 - 10.3 mg/dL Final   Total Protein  03/11/2023 6.5  6.5 - 8.1 g/dL Final   Albumin 32/44/0102 3.7  3.5 - 5.0 g/dL Final   AST 72/53/6644 63 (H)  15 - 41 U/L Final   ALT 03/11/2023 41  0 - 44 U/L Final   Alkaline Phosphatase 03/11/2023 115  38 - 126 U/L Final   Total Bilirubin 03/11/2023 0.6  0.3 - 1.2 mg/dL Final   GFR, Estimated 03/11/2023 >60  >60 mL/min Final   Comment: (NOTE) Calculated using the CKD-EPI Creatinine Equation (2021)    Anion gap 03/11/2023 9  5 - 15 Final   Performed at [facility], [address] [place], [place] [address]   WBC 03/11/2023 10.6 (H)  4.0 - 10.5 K/uL Final   RBC 03/11/2023 4.22  4.22 - 5.81 MIL/uL Final   Hemoglobin 03/11/2023 11.6 (L)  13.0 - 17.0 g/dL Final   HCT 25/95/6387 36.5 (L)  39.0 - 52.0 % Final   MCV 03/11/2023 86.5  80.0 - 100.0 fL Final   MCH 03/11/2023 27.5  26.0 - 34.0 pg Final   MCHC 03/11/2023 31.8  30.0 - 36.0 g/dL Final   RDW 56/43/3295 13.4  11.5 - 15.5 % Final   Platelets 03/11/2023 351  150 - 400  K/uL Final   nRBC 03/11/2023 0.0  0.0 - 0.2 % Final   Performed at [facility], [address], [place], [place] [address]   Acetaminophen (Tylenol), Serum 03/11/2023 <10 (L)  10 - 30 ug/mL Final   Comment: (NOTE) Therapeutic concentrations vary significantly. A range of 10-30 ug/mL  may be an effective concentration for many patients. However, some  are best treated at concentrations outside of this range. Acetaminophen concentrations >150 ug/mL at 4 hours after ingestion  and >50 ug/mL at 12 hours after ingestion are often associated with  toxic reactions.  Performed at [facility], [address], [place], [place] [address]    Salicylate Lvl 03/11/2023 <7.0 (L)  7.0 - 30.0 mg/dL Final   Performed at [facility], [address] [place], [place] [address]   Alcohol, Ethyl (B) 03/11/2023 <10  <10 mg/dL Final   Comment: (NOTE) Lowest detectable limit for serum alcohol is 10  mg/dL.  For medical purposes only. Performed at [facility], [address], [place], [place] [address]    Lactic Acid, Venous 03/11/2023 1.0  0.5 - 1.9 mmol/L Final   Performed at [facility], [address] [place], [place] [address]   Specimen Description 03/11/2023    Final                   Value:BLOOD BLOOD RIGHT ARM Performed at [facility], [address] [place], [place] [address]    Special Requests 03/11/2023    Final                   Value:BOTTLES DRAWN AEROBIC AND ANAEROBIC Blood Culture adequate volume Performed at [facility], [address] [place], [place] [address]    Culture 03/11/2023    Final                   Value:NO GROWTH 4 DAYS Performed at [facility], [address], [place], [place] [address]    Report Status 03/11/2023 PENDING   Incomplete  Admission on 01/28/2023, Discharged on 02/06/2023  Component Date Value Ref Range Status   Sodium 01/28/2023 132 (L)  135 - 145 mmol/L Final   Potassium 01/28/2023 3.4 (L)  3.5 - 5.1 mmol/L Final   Chloride 01/28/2023 100  98 - 111 mmol/L Final   CO2 01/28/2023 22  22 - 32 mmol/L Final   Glucose, Bld 01/28/2023 84  70 - 99 mg/dL Final   Glucose reference range applies only to samples taken after fasting for at least 8 hours.   BUN 01/28/2023 11  6 - 20 mg/dL Final   Creatinine, Ser 01/28/2023 0.56 (L)  0.61 - 1.24 mg/dL Final   Calcium 28/41/3244 8.9  8.9 - 10.3 mg/dL Final   Total Protein 12/06/7251 7.0  6.5 - 8.1 g/dL Final   Albumin 66/44/0347 4.0  3.5 - 5.0 g/dL Final   AST 42/59/5638 41  15 - 41 U/L Final   ALT 01/28/2023 38  0 - 44 U/L Final   Alkaline Phosphatase 01/28/2023 110  38 - 126 U/L Final   Total Bilirubin 01/28/2023 1.3 (H)  0.3 - 1.2 mg/dL Final   GFR, Estimated 01/28/2023 >60  >60 mL/min Final   Comment: (NOTE) Calculated using the CKD-EPI Creatinine Equation (2021)    Anion gap 01/28/2023 10  5 - 15  Final   Performed at [facility],  [address], [place], [place] [address]   WBC 01/28/2023 10.1  4.0 - 10.5 K/uL Final   RBC 01/28/2023 4.50  4.22 - 5.81 MIL/uL Final   Hemoglobin 01/28/2023 12.9 (L)  13.0 - 17.0 g/dL Final   HCT 60/45/4098 39.5  39.0 - 52.0 % Final   MCV 01/28/2023 87.8  80.0 - 100.0 fL Final   MCH 01/28/2023 28.7  26.0 - 34.0 pg Final   MCHC 01/28/2023 32.7  30.0 - 36.0 g/dL Final   RDW 11/91/4782 11.9  11.5 - 15.5 % Final   Platelets 01/28/2023 429 (H)  150 - 400 K/uL Final   nRBC 01/28/2023 0.0  0.0 - 0.2 % Final   Neutrophils Relative % 01/28/2023 49  % Final   Neutro Abs 01/28/2023 5.0  1.7 - 7.7 K/uL Final   Lymphocytes Relative 01/28/2023 31  % Final   Lymphs Abs 01/28/2023 3.1  0.7 - 4.0 K/uL Final   Monocytes Relative 01/28/2023 13  % Final   Monocytes Absolute 01/28/2023 1.3 (H)  0.1 - 1.0 K/uL Final   Eosinophils Relative 01/28/2023 6  % Final   Eosinophils Absolute 01/28/2023 0.6 (H)  0.0 - 0.5 K/uL Final   Basophils Relative 01/28/2023 1  % Final   Basophils Absolute 01/28/2023 0.1  0.0 - 0.1 K/uL Final   Immature Granulocytes 01/28/2023 0  % Final   Abs Immature Granulocytes 01/28/2023 0.04  0.00 - 0.07 K/uL Final   Performed at [facility], [address] [place], [place] [address]   Specimen Description 01/28/2023    Final                   Value:BLOOD LEFT HAND Performed at [facility], [address], [place], [place] [address]    Special Requests 01/28/2023    Final                   Value:BOTTLES DRAWN AEROBIC AND ANAEROBIC Blood Culture results may not be optimal due to an excessive volume of blood received in culture bottles Performed at [facility], [address], [place], [place] [address]    Culture 01/28/2023    Final                   Value:NO GROWTH 5 DAYS Performed at [facility], [address], [place], [place] [address]    Report Status 01/28/2023  02/02/2023 FINAL   Final   Specimen Description 01/28/2023    Final                   Value:BLOOD RIGHT FOREARM Performed at [facility], [address], [place], [place] [address]    Special Requests 01/28/2023    Final                   Value:BOTTLES DRAWN AEROBIC AND ANAEROBIC Blood Culture adequate volume Performed at [facility], [address], [place], [place] [address]    Culture 01/28/2023    Final                   Value:NO GROWTH 5 DAYS Performed at [facility], [address] [place], [place] [address]    Report Status 01/28/2023 02/02/2023 FINAL   Final   Lactic Acid, Venous 01/28/2023 1.4  0.5 - 1.9 mmol/L Final   Performed at [facility], [address]  [place], [place] [address]   Opiates 01/28/2023 NONE DETECTED  NONE DETECTED Final   Cocaine 01/28/2023 NONE DETECTED  NONE DETECTED Final   Benzodiazepines 01/28/2023 NONE DETECTED  NONE DETECTED Final   Amphetamines 01/28/2023 POSITIVE (A)  NONE DETECTED Final   Tetrahydrocannabinol 01/28/2023 POSITIVE (A)  NONE DETECTED Final   Barbiturates 01/28/2023 NONE DETECTED  NONE DETECTED Final   Comment: (NOTE) DRUG SCREEN FOR MEDICAL PURPOSES ONLY.  IF CONFIRMATION IS NEEDED FOR ANY PURPOSE, NOTIFY LAB WITHIN 5 DAYS.  LOWEST DETECTABLE LIMITS FOR URINE DRUG SCREEN Drug Class                     Cutoff (ng/mL) Amphetamine and metabolites    1000 Barbiturate and metabolites    200 Benzodiazepine                 200 Opiates and metabolites        300 Cocaine and metabolites        300 THC                            50 Performed at [facility], [address] [place], [place] [address]    Color, Urine 01/28/2023 YELLOW  YELLOW Final   APPearance 01/28/2023 CLEAR  CLEAR Final   Specific Gravity, Urine 01/28/2023 1.019  1.005 - 1.030 Final   pH 01/28/2023 6.0  5.0 - 8.0 Final   Glucose, UA 01/28/2023 NEGATIVE  NEGATIVE mg/dL Final   Hgb urine  dipstick 01/28/2023 NEGATIVE  NEGATIVE Final   Bilirubin Urine 01/28/2023 NEGATIVE  NEGATIVE Final   Ketones, ur 01/28/2023 20 (A)  NEGATIVE mg/dL Final   Protein, ur 09/81/1914 30 (A)  NEGATIVE mg/dL Final   Nitrite 78/29/5621 NEGATIVE  NEGATIVE Final   Leukocytes,Ua 01/28/2023 NEGATIVE  NEGATIVE Final   RBC / HPF 01/28/2023 21-50  0 - 5 RBC/hpf Final   WBC, UA 01/28/2023 6-10  0 - 5 WBC/hpf Final   Bacteria, UA 01/28/2023 NONE SEEN  NONE SEEN Final   Squamous Epithelial / HPF 01/28/2023 0-5  0 - 5 /HPF Final   Mucus 01/28/2023 PRESENT   Final   Ca Oxalate Crys, UA 01/28/2023 PRESENT   Final   Performed at [facility], [address] [place], [place] [address]   WBC 01/29/2023 5.8  4.0 - 10.5 K/uL Final   RBC 01/29/2023 3.62 (L)  4.22 - 5.81 MIL/uL Final   Hemoglobin 01/29/2023 10.9 (L)  13.0 - 17.0 g/dL Final   HCT 78/46/9629 33.2 (L)  39.0 - 52.0 % Final   MCV 01/29/2023 91.7  80.0 - 100.0 fL Final   MCH 01/29/2023 30.1  26.0 - 34.0 pg Final   MCHC 01/29/2023 32.8  30.0 - 36.0 g/dL Final   RDW 52/84/1324 12.3  11.5 - 15.5 % Final   Platelets 01/29/2023 290  150 - 400 K/uL Final   nRBC 01/29/2023 0.0  0.0 - 0.2 % Final   Performed at [facility], [address] [place], [place] [address]   Sodium 01/29/2023 135  135 - 145 mmol/L Final   Potassium 01/29/2023 3.3 (L)  3.5 - 5.1 mmol/L Final   Chloride 01/29/2023 108  98 - 111 mmol/L Final   CO2 01/29/2023 20 (L)  22 - 32 mmol/L Final   Glucose, Bld 01/29/2023 147 (H)  70 - 99 mg/dL Final   Glucose reference range applies only  to samples taken after fasting for at least 8 hours.   BUN 01/29/2023 8  6 - 20 mg/dL Final   Creatinine, Ser 01/29/2023 0.54 (L)  0.61 - 1.24 mg/dL Final   Calcium 16/09/9603 8.1 (L)  8.9 - 10.3 mg/dL Final   GFR, Estimated 01/29/2023 >60  >60 mL/min Final   Comment: (NOTE) Calculated using the CKD-EPI Creatinine Equation (2021)    Anion gap 01/29/2023 7  5 - 15  Final   Performed at [facility], [address] [place], [place] [address]   Magnesium 01/29/2023 2.1  1.7 - 2.4 mg/dL Final   Performed at [facility], [address] [place], [place] [address]   Phosphorus 01/29/2023 2.9  2.5 - 4.6 mg/dL Final   Performed at [facility], [address], [place], [place] [address]   Potassium 01/28/2023 3.2 (L)  3.5 - 5.1 mmol/L Final   Performed at [facility], [address] [place], [place] [address]   Magnesium 01/28/2023 1.8  1.7 - 2.4 mg/dL Final   Performed at [facility], [address] [place], [place] [address]   Sodium 01/30/2023 138  135 - 145 mmol/L Final   Potassium 01/30/2023 4.5  3.5 - 5.1 mmol/L Final   Chloride 01/30/2023 110  98 - 111 mmol/L Final   CO2 01/30/2023 20 (L)  22 - 32 mmol/L Final   Glucose, Bld 01/30/2023 119 (H)  70 - 99 mg/dL Final   Glucose reference range applies only to samples taken after fasting for at least 8 hours.   BUN 01/30/2023 7  6 - 20 mg/dL Final   Creatinine, Ser 01/30/2023 0.60 (L)  0.61 - 1.24 mg/dL Final   Calcium 46/96/2952 8.0 (L)  8.9 - 10.3 mg/dL Final   GFR, Estimated 01/30/2023 >60  >60 mL/min Final   Comment: (NOTE) Calculated using the CKD-EPI Creatinine Equation (2021)    Anion gap 01/30/2023 8  5 - 15 Final   Performed at [facility], [address] [place], [place] [address]   Magnesium 01/30/2023 1.5 (L)  1.7 - 2.4 mg/dL Final   Performed at [facility], [address] [place], [place] [address]   WBC 01/30/2023 4.1  4.0 - 10.5 K/uL Final   RBC 01/30/2023 3.74 (L)  4.22 - 5.81 MIL/uL Final   Hemoglobin 01/30/2023 11.0 (L)  13.0 - 17.0 g/dL Final   HCT 27/25/3664 33.5 (L)  39.0 - 52.0 % Final   MCV 01/30/2023 89.6  80.0 - 100.0 fL Final   MCH 01/30/2023 29.4  26.0 - 34.0 pg Final   MCHC 01/30/2023 32.8  30.0 - 36.0 g/dL Final   RDW 40/34/7425  12.4  11.5 - 15.5 % Final   Platelets 01/30/2023 254  150 - 400 K/uL Final   nRBC 01/30/2023 0.0  0.0 - 0.2 % Final   Performed at [facility], [address], [place], [place] [address]   Glucose-Capillary 01/30/2023 116 (H)  70 - 99 mg/dL Final   Glucose reference range applies only to samples taken after fasting for at least 8 hours.   Comment 1 01/30/2023 Notify RN   Final   Comment 2 01/30/2023 Document in Chart   Final   WBC 01/31/2023 5.0  4.0 - 10.5 K/uL Final   RBC 01/31/2023 4.23  4.22 - 5.81 MIL/uL Final   Hemoglobin 01/31/2023 12.2 (L)  13.0 - 17.0 g/dL Final  HCT 01/31/2023 38.1 (L)  39.0 - 52.0 % Final   MCV 01/31/2023 90.1  80.0 - 100.0 fL Final   MCH 01/31/2023 28.8  26.0 - 34.0 pg Final   MCHC 01/31/2023 32.0  30.0 - 36.0 g/dL Final   RDW 04/54/0981 12.3  11.5 - 15.5 % Final   Platelets 01/31/2023 310  150 - 400 K/uL Final   nRBC 01/31/2023 0.0  0.0 - 0.2 % Final   Neutrophils Relative % 01/31/2023 48  % Final   Neutro Abs 01/31/2023 2.4  1.7 - 7.7 K/uL Final   Lymphocytes Relative 01/31/2023 33  % Final   Lymphs Abs 01/31/2023 1.6  0.7 - 4.0 K/uL Final   Monocytes Relative 01/31/2023 11  % Final   Monocytes Absolute 01/31/2023 0.6  0.1 - 1.0 K/uL Final   Eosinophils Relative 01/31/2023 7  % Final   Eosinophils Absolute 01/31/2023 0.3  0.0 - 0.5 K/uL Final   Basophils Relative 01/31/2023 1  % Final   Basophils Absolute 01/31/2023 0.1  0.0 - 0.1 K/uL Final   Immature Granulocytes 01/31/2023 0  % Final   Abs Immature Granulocytes 01/31/2023 0.02  0.00 - 0.07 K/uL Final   Performed at [facility], [address] [place], [place] [address]   Sodium 01/31/2023 136  135 - 145 mmol/L Final   Potassium 01/31/2023 3.6  3.5 - 5.1 mmol/L Final   Chloride 01/31/2023 102  98 - 111 mmol/L Final   CO2 01/31/2023 26  22 - 32 mmol/L Final   Glucose, Bld 01/31/2023 117 (H)  70 - 99 mg/dL Final   Glucose reference range applies only to  samples taken after fasting for at least 8 hours.   BUN 01/31/2023 10  6 - 20 mg/dL Final   Creatinine, Ser 01/31/2023 0.55 (L)  0.61 - 1.24 mg/dL Final   Calcium 82/95/6213 8.4 (L)  8.9 - 10.3 mg/dL Final   GFR, Estimated 01/31/2023 >60  >60 mL/min Final   Comment: (NOTE) Calculated using the CKD-EPI Creatinine Equation (2021)    Anion gap 01/31/2023 8  5 - 15 Final   Performed at [facility], [address] [place], [place] [address]   Magnesium 01/31/2023 1.8  1.7 - 2.4 mg/dL Final   Performed at [facility], [address], [place], [place] [address]   Phosphorus 01/31/2023 4.0  2.5 - 4.6 mg/dL Final   Performed at [facility], [address], [place], [place] [address]   WBC 02/04/2023 6.3  4.0 - 10.5 K/uL Final   RBC 02/04/2023 4.86  4.22 - 5.81 MIL/uL Final   Hemoglobin 02/04/2023 14.1  13.0 - 17.0 g/dL Final   HCT 41/32/4401 43.8  39.0 - 52.0 % Final   MCV 02/04/2023 90.1  80.0 - 100.0 fL Final   MCH 02/04/2023 29.0  26.0 - 34.0 pg Final   MCHC 02/04/2023 32.2  30.0 - 36.0 g/dL Final   RDW 02/72/5366 12.3  11.5 - 15.5 % Final   Platelets 02/04/2023 272  150 - 400 K/uL Final   nRBC 02/04/2023 0.0  0.0 - 0.2 % Final   Neutrophils Relative % 02/04/2023 35  % Final   Neutro Abs 02/04/2023 2.2  1.7 - 7.7 K/uL Final   Lymphocytes Relative 02/04/2023 41  % Final   Lymphs Abs 02/04/2023 2.6  0.7 - 4.0 K/uL Final   Monocytes Relative 02/04/2023 13  % Final   Monocytes Absolute 02/04/2023 0.8  0.1 -  1.0 K/uL Final   Eosinophils Relative 02/04/2023 9  % Final   Eosinophils Absolute 02/04/2023 0.6 (H)  0.0 - 0.5 K/uL Final   Basophils Relative 02/04/2023 1  % Final   Basophils Absolute 02/04/2023 0.1  0.0 - 0.1 K/uL Final   Immature Granulocytes 02/04/2023 1  % Final   Abs Immature Granulocytes 02/04/2023 0.03  0.00 - 0.07 K/uL Final   Performed at [facility], [address], [place], [place] [address]    Sodium 02/04/2023 134 (L)  135 - 145 mmol/L Final   Potassium 02/04/2023 4.2  3.5 - 5.1 mmol/L Final   Chloride 02/04/2023 100  98 - 111 mmol/L Final   CO2 02/04/2023 25  22 - 32 mmol/L Final   Glucose, Bld 02/04/2023 111 (H)  70 - 99 mg/dL Final   Glucose reference range applies only to samples taken after fasting for at least 8 hours.   BUN 02/04/2023 18  6 - 20 mg/dL Final   Creatinine, Ser 02/04/2023 0.57 (L)  0.61 - 1.24 mg/dL Final   Calcium 60/45/4098 9.0  8.9 - 10.3 mg/dL Final   GFR, Estimated 02/04/2023 >60  >60 mL/min Final   Comment: (NOTE) Calculated using the CKD-EPI Creatinine Equation (2021)    Anion gap 02/04/2023 9  5 - 15 Final   Performed at [facility], [address] [place], [place] [address]   Magnesium 02/04/2023 1.9  1.7 - 2.4 mg/dL Final   Performed at [facility], [address], [place], [place] [address]   TSH 02/04/2023 4.836 (H)  0.350 - 4.500 uIU/mL Final   Comment: Performed by a 3rd Generation assay with a functional sensitivity of <=0.01 uIU/mL. Performed at [facility], [address], [place], [place] [address]   Admission on 01/10/2023, Discharged on 01/11/2023  Component Date Value Ref Range Status   Glucose-Capillary 01/10/2023 123 (H)  70 - 99 mg/dL Final   Glucose reference range applies only to samples taken after fasting for at least 8 hours.   Sodium 01/10/2023 137  135 - 145 mmol/L Final   Potassium 01/10/2023 4.7  3.5 - 5.1 mmol/L Final   HEMOLYSIS AT THIS LEVEL MAY AFFECT RESULT   Chloride 01/10/2023 103  98 - 111 mmol/L Final   CO2 01/10/2023 24  22 - 32 mmol/L Final   Glucose, Bld 01/10/2023 105 (H)  70 - 99 mg/dL Final   Glucose reference range applies only to samples taken after fasting for at least 8 hours.   BUN 01/10/2023 8  6 - 20 mg/dL Final   Creatinine, Ser 01/10/2023 0.65  0.61 - 1.24 mg/dL Final   Calcium 86/57/8469 8.9  8.9 - 10.3 mg/dL Final   Total  Protein 01/10/2023 6.9  6.5 - 8.1 g/dL Final   Albumin 62/95/2841 3.7  3.5 - 5.0 g/dL Final   AST 32/44/0102 82 (H)  15 - 41 U/L Final   HEMOLYSIS AT THIS LEVEL MAY AFFECT RESULT   ALT 01/10/2023 72 (H)  0 - 44 U/L Final   HEMOLYSIS AT THIS LEVEL MAY AFFECT RESULT   Alkaline Phosphatase 01/10/2023 158 (H)  38 - 126 U/L Final   Total Bilirubin 01/10/2023 1.1  0.3 - 1.2 mg/dL Final   HEMOLYSIS AT THIS LEVEL MAY AFFECT RESULT   GFR, Estimated 01/10/2023 >60  >60 mL/min Final   Comment: (NOTE) Calculated using the CKD-EPI Creatinine Equation (2021)    Anion gap 01/10/2023 10  5 -  15 Final   Performed at [facility], [address], [place], [place] [address]   Salicylate Lvl 01/10/2023 <7.0 (L)  7.0 - 30.0 mg/dL Final   Performed at [facility], [address], [place], [place] [address]   Acetaminophen (Tylenol), Serum 01/10/2023 <10 (L)  10 - 30 ug/mL Final   Comment: (NOTE) Therapeutic concentrations vary significantly. A range of 10-30 ug/mL  may be an effective concentration for many patients. However, some  are best treated at concentrations outside of this range. Acetaminophen concentrations >150 ug/mL at 4 hours after ingestion  and >50 ug/mL at 12 hours after ingestion are often associated with  toxic reactions.  Performed at [facility], [address], [place], [place] [address]    Alcohol, Ethyl (B) 01/10/2023 <10  <10 mg/dL Final   Comment: (NOTE) Lowest detectable limit for serum alcohol is 10 mg/dL.  For medical purposes only. Performed at [facility], [address] [place], [place] [address]    WBC 01/10/2023 13.4 (H)  4.0 - 10.5 K/uL Final   RBC 01/10/2023 4.61  4.22 - 5.81 MIL/uL Final   Hemoglobin 01/10/2023 13.9  13.0 - 17.0 g/dL Final   HCT 53/66/4403 41.7  39.0 - 52.0 % Final   MCV 01/10/2023 90.5  80.0 - 100.0 fL Final   MCH 01/10/2023 30.2  26.0 - 34.0 pg Final   MCHC 01/10/2023 33.3  30.0 - 36.0 g/dL Final   RDW 47/42/5956  11.9  11.5 - 15.5 % Final   Platelets 01/10/2023 290  150 - 400 K/uL Final   nRBC 01/10/2023 0.0  0.0 - 0.2 % Final   Neutrophils Relative % 01/10/2023 79  % Final   Neutro Abs 01/10/2023 10.7 (H)  1.7 - 7.7 K/uL Final   Lymphocytes Relative 01/10/2023 12  % Final   Lymphs Abs 01/10/2023 1.6  0.7 - 4.0 K/uL Final   Monocytes Relative 01/10/2023 6  % Final   Monocytes Absolute 01/10/2023 0.7  0.1 - 1.0 K/uL Final   Eosinophils Relative 01/10/2023 2  % Final   Eosinophils Absolute 01/10/2023 0.2  0.0 - 0.5 K/uL Final   Basophils Relative 01/10/2023 1  % Final   Basophils Absolute 01/10/2023 0.1  0.0 - 0.1 K/uL Final   Immature Granulocytes 01/10/2023 0  % Final   Abs Immature Granulocytes 01/10/2023 0.04  0.00 - 0.07 K/uL Final   Performed at [facility], [address], [place], [place] [address]  Admission on 12/12/2022, Discharged on 12/12/2022  Component Date Value Ref Range Status   Glucose-Capillary 12/12/2022 45 (L)  70 - 99 mg/dL Final   Glucose reference range applies only to samples taken after fasting for at least 8 hours.   Glucose-Capillary 12/12/2022 134 (H)  70 - 99 mg/dL Final   Glucose reference range applies only to samples taken after fasting for at least 8 hours.    Blood Alcohol level:  Lab Results  Component Value Date   ETH <10 03/12/2023   ETH <10 03/11/2023    Metabolic Disorder Labs: No results found for: "HGBA1C", "MPG" No results found for: "PROLACTIN" Lab Results  Component Value Date   TRIG 189 (H) 11/18/2021    Therapeutic Lab Levels: No results found for: "LITHIUM" No results found for: "VALPROATE" No results found for: "CBMZ"  Physical Findings   CAGE-AID    Flowsheet Row ED to Hosp-Admission (Discharged) from 09/27/2021 in [facility] 5  [facility]  CAGE-AID Score 3      PHQ2-9    Flowsheet Row ED from 03/15/2023 in [facility]  PHQ-2 Total Score 6  PHQ-9 Total Score 22       Flowsheet Row ED from 03/15/2023 in [facility] ED from 03/13/2023 in [facility] Department at [facility] ED from 03/12/2023 in [facility] Emergency Department at [facility]  C-SSRS RISK CATEGORY No Risk No Risk No Risk        Musculoskeletal  Strength & Muscle Tone: within normal limits Gait & Station: normal Patient leans: N/A  Psychiatric Specialty Exam  Presentation  General Appearance:  Appropriate for Environment  Eye Contact: Minimal  Speech: Clear and Coherent  Speech Volume: Normal  Handedness:No data recorded  Mood and Affect  Mood: Euthymic  Affect: Appropriate   Thought Process  Thought Processes: Coherent  Descriptions of Associations:Intact  Orientation:Full (Time, Place and Person)  Thought Content:WDL     Hallucinations:No data recorded Ideas of Reference:None  Suicidal Thoughts:No data recorded Homicidal Thoughts:No data recorded  Sensorium  Memory: Immediate Fair; Remote Fair  Judgment: Fair  Insight: Fair   Art therapist  Concentration: Fair  Attention Span: Fair  Recall: Fair  Fund of Knowledge: Fair  Language: Good   Psychomotor Activity  Psychomotor Activity:No data recorded  Assets  Assets: Communication Skills; Physical Health; Desire for Improvement; Social Support   Sleep  Sleep:No data recorded  No data recorded  Physical Exam  Physical Exam Vitals and nursing note reviewed.  Constitutional:      Appearance: Normal appearance.  Cardiovascular:     Rate and Rhythm: Normal rate and regular rhythm.  Neurological:     Mental Status: He is alert and oriented to person, place, and time.  Psychiatric:        Mood and Affect: Mood normal.        Behavior: Behavior normal.    Review of Systems  Psychiatric/Behavioral:  Positive for depression. The patient is nervous/anxious.   All other systems reviewed and are  negative.  Blood pressure 114/61, pulse 78, temperature 98.6 F (37 C), temperature source Oral, resp. rate 16, SpO2 97 %. There is no height or weight on file to calculate BMI.  Treatment Plan Summary: Daily contact with patient to assess and evaluate symptoms and progress in treatment and Medication management  Continue with current treatment plan reports 11/23/2023 as listed below except were noted  Repeat urinary analysis Continue clonidine protocol (COWS) Continue gabapentin 300 mg p.o. 3 times daily Patient was recently restarted on Seroquel 300 mg nightly Continue trazodone 50 mg p.o. as needed  Patient encouraged to participate within the therapeutic milieu CSW to continue working on discharge disposition for outpatient follow-up   Oneta Rack, NP 03/16/2023 10:06 AM

## 2023-03-16 NOTE — ED Notes (Signed)
Patient is in the group room with staff having a group meeting. Alert,will continue to monitor for safety. 

## 2023-03-16 NOTE — Group Note (Signed)
Group Topic: Relaxation  Group Date: 03/16/2023 Start Time: 1100 End Time: 1150 Facilitators: Jenean Lindau, RN  Department: [facility]  Number of Participants: 4  Group Focus: anxiety, relapse prevention, and relaxation Treatment Modality:  Psychoeducation Interventions utilized were patient education and problem solving Purpose: enhance coping skills, explore maladaptive thinking, express feelings, express irrational fears, increase insight, and relapse prevention strategies  Name: Jason Buchanan Date of Birth: 09-05-1986  MR: [id number]    Level of Participation: active Quality of Participation: attentive and cooperative Interactions with others: gave feedback Mood/Affect: appropriate Triggers (if applicable): n/a Cognition: logical Progress: Gaining insight Response: positive Plan: patient will be encouraged to continue to attend group  Patients Problems:  Patient Active Problem List   Diagnosis Date Noted   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   Acute psychosis 01/28/2023   Lactic acidosis 01/14/2023   Abscess of left hand 01/13/2023   Elevated transaminase level 01/13/2023   Mood disorder 01/13/2023   Alcohol use 01/13/2023   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

## 2023-03-16 NOTE — Discharge Planning (Signed)
LCSW received update from patient that he has been in contact with Admission Coordinator Richardson Dopp at the [facility] in [place] and he has been accepted into their program for admission. Per patient, Richardson Dopp is requesting for LCSW to follow up to confirm.   LCSW contacted Admissions Coordinator Richardson Dopp at [phone] regarding referral. Per Richardson Dopp, patient has been accepted to their program pending he is no longer on the suboxone. Richardson Dopp reports he would feel more comfortable accepting the patient on Monday 03/19/23 when he has fully detox. LCSW expressed understanding and informed him that MD will be made aware. Patient can admit to their facility on Monday at 1:00pm. Richardson Dopp is asking for a 30 minute heads up that patient is on the way. Transportation to be arranged to: [address] [place], [place] [address]. Copy of discharge paperwork is the only thing requested from facility. No other needs to report.   Patient is requesting application for [facility]. Information will be provided to the patient for his review.   Fernande Boyden, LCSW Clinical Social Worker [facility] Ph: [phone]

## 2023-03-17 DIAGNOSIS — F1520 Other stimulant dependence, uncomplicated: Secondary | ICD-10-CM | POA: Diagnosis not present

## 2023-03-17 LAB — CULTURE, BLOOD (ROUTINE X 2)
Culture: NO GROWTH
Special Requests: ADEQUATE

## 2023-03-17 MED ORDER — CLONIDINE HCL 0.1 MG PO TABS
0.1000 mg | ORAL_TABLET | Freq: Two times a day (BID) | ORAL | Status: DC
  Filled 2023-03-17: qty 1

## 2023-03-17 MED ORDER — QUETIAPINE FUMARATE ER 50 MG PO TB24
250.0000 mg | ORAL_TABLET | Freq: Every day | ORAL | Status: DC
  Administered 2023-03-17 – 2023-03-18 (×2): 250 mg via ORAL
  Filled 2023-03-17 (×2): qty 1

## 2023-03-17 NOTE — ED Notes (Signed)
Pt is sleeping. No distress noted. Will continue to monitor for safety. 

## 2023-03-17 NOTE — Progress Notes (Signed)
Pt is asleep. Respirations are even and unlabored. Pt complained of a headache 6/10. No signs of acute distress noted. Pt denies SI/HI/AVH, plan or intent. Staff will monitor for pt's safety.

## 2023-03-17 NOTE — ED Notes (Signed)
Pt is in the bed sleeping. Respirations are even and unlabored. No acute distress noted. Will continue to monitor for safety. 

## 2023-03-17 NOTE — ED Provider Notes (Cosign Needed Addendum)
Behavioral Health Progress Note  Date and Time: 03/17/2023 1:15 PM Name: Jason Buchanan MRN:  [id number]  Subjective:  Jason Buchanan is a  37 y.o., male patient presented to [facility] as a transfer from [facility], voluntarily, for admission to facility based crisis center to continue detox of heroin. He has a hx of substance use d/o and self- reported Bipolar d/o and PTSD. Of note, patient's UDS is positive for methamphetamines and THC only.   Despite UDS negative for opioid use, patient has provided a very confusing timeline of last opioid use. In the [facility] he reported that he could not remember the last time he had suboxone indicated that it was not within the last few days. On assessment today he reports that he came to get detox from opioids but reports that he has not had heroin or suboxone at least 1+ weeks. He endorses that he feels like the clonidine helps with cravings but also endorses symptomatic hypotension. He reports that he has some anxiety about where he is going next, but reports that restarting his Seroquel and gabapentin have been very helpful for his mood and anxiety. He reports that he did have some difficulty falling asleep last night, but got better rest later. He denies SI, HI, and AVH. He denies chet pain and nausea but endorses he is still having myalgia and chills.   Diagnosis:  Final diagnoses:  Methamphetamine use disorder, severe, dependence    Total Time spent with patient: 20 minutes  Past Psychiatric History: Polysubstance Abuse, IV drug use, Hx of Suboxone treatment, not currently prescribed.  Past Medical History: Denies contributory medical history.  Family History: unknown Family Psychiatric  History: unknown Social History: unknown  Additional Social History:                         Sleep: Fair  Appetite:  Good  Current Medications:  Current Facility-Administered Medications  Medication Dose Route Frequency Provider Last Rate Last Admin    alum & mag hydroxide-simeth (MAALOX/MYLANTA) 200-200-20 MG/5ML suspension 30 mL  30 mL Oral Q4H PRN Bing Neighbors, NP       dicyclomine (BENTYL) tablet 20 mg  20 mg Oral Q6H PRN Bing Neighbors, NP       gabapentin (NEURONTIN) capsule 300 mg  300 mg Oral TID Bing Neighbors, NP   300 mg at 03/17/23 4132   hydrOXYzine (ATARAX) tablet 50 mg  50 mg Oral Q6H PRN Bing Neighbors, NP   50 mg at 03/17/23 1228   loperamide (IMODIUM) capsule 2-4 mg  2-4 mg Oral PRN Bing Neighbors, NP       magnesium hydroxide (MILK OF MAGNESIA) suspension 30 mL  30 mL Oral Daily PRN Bing Neighbors, NP       methocarbamol (ROBAXIN) tablet 500 mg  500 mg Oral Q8H PRN Bing Neighbors, NP   500 mg at 03/15/23 1950   naproxen (NAPROSYN) tablet 500 mg  500 mg Oral BID PRN Bing Neighbors, NP       nicotine (NICODERM CQ - dosed in mg/24 hours) patch 21 mg  21 mg Transdermal Daily Bing Neighbors, NP   21 mg at 03/17/23 0918   ondansetron (ZOFRAN-ODT) disintegrating tablet 4 mg  4 mg Oral Q6H PRN Bing Neighbors, NP       QUEtiapine (SEROQUEL XR) 24 hr tablet 300 mg  300 mg Oral QHS Bing Neighbors, NP  300 mg at 03/16/23 2120   traZODone (DESYREL) tablet 50 mg  50 mg Oral QHS Bing Neighbors, NP   50 mg at 03/15/23 2111   Current Outpatient Medications  Medication Sig Dispense Refill   albuterol (VENTOLIN HFA) 108 (90 Base) MCG/ACT inhaler Inhale 2 puffs into the lungs every 6 (six) hours as needed for wheezing or shortness of breath.     buprenorphine-naloxone (SUBOXONE) 8-2 mg SUBL SL tablet Place 1 tablet under the tongue 2 (two) times daily for 7 days 14 tablet 0   cyclobenzaprine (FLEXERIL) 10 MG tablet Take 10 mg by mouth 3 (three) times daily as needed for muscle spasms.     fluticasone (FLONASE ALLERGY RELIEF) 50 MCG/ACT nasal spray Place 1 spray into both nostrils in the morning and at bedtime.     folic acid (FOLVITE) 1 MG tablet Take 1 tablet (1 mg total) by mouth daily. 90  tablet 0   gabapentin (NEURONTIN) 300 MG capsule Take 1 capsule (300 mg total) by mouth 3 (three) times daily. 90 capsule 0   hydrOXYzine (ATARAX) 10 MG tablet Take 1 tablet (10 mg total) by mouth every 6 (six) hours as needed for anxiety. (Patient taking differently: Take 10 mg by mouth 3 (three) times daily.) 30 tablet 0   ibuprofen (ADVIL) 200 MG tablet Take 600-800 mg by mouth every 6 (six) hours as needed for mild pain or headache.     naloxone (NARCAN) nasal spray 4 mg/0.1 mL Take as needed for Narcotic overdose. (Patient not taking: Reported on 03/14/2023) 1 each 0   naproxen sodium (ALEVE) 220 MG tablet Take 220-440 mg by mouth 2 (two) times daily as needed (for pain or headaches).     OPCON-A 0.027-0.315 % SOLN Place 1 drop into both eyes 3 (three) times daily as needed (for itching).     pantoprazole (PROTONIX) 40 MG tablet Take 1 tablet (40 mg total) by mouth daily. (Patient taking differently: Take 40 mg by mouth daily before breakfast.) 30 tablet 0   polyethylene glycol powder (MIRALAX) 17 GM/SCOOP powder Take 17 g by mouth daily as needed (constipation). (Patient taking differently: Take 17 g by mouth daily as needed for mild constipation.) 255 g 0   QUEtiapine (SEROQUEL XR) 300 MG 24 hr tablet Take 1 tablet (300 mg total) by mouth at bedtime. 30 tablet 0   senna (SENOKOT) 8.6 MG TABS tablet Take 2 tablets (17.2 mg total) by mouth at bedtime. (Patient not taking: Reported on 03/14/2023) 120 tablet 0   thiamine (VITAMIN B-1) 100 MG tablet Take 1 tablet (100 mg total) by mouth daily 100 tablet 3   ZYRTEC ALLERGY 10 MG tablet Take 10 mg by mouth at bedtime.      Labs  Lab Results:  Admission on 03/15/2023  Component Date Value Ref Range Status   WBC 03/16/2023 8.2  4.0 - 10.5 K/uL Final   RBC 03/16/2023 5.27  4.22 - 5.81 MIL/uL Final   Hemoglobin 03/16/2023 14.6  13.0 - 17.0 g/dL Final   HCT 96/03/5408 44.4  39.0 - 52.0 % Final   MCV 03/16/2023 84.3  80.0 - 100.0 fL Final   MCH  03/16/2023 27.7  26.0 - 34.0 pg Final   MCHC 03/16/2023 32.9  30.0 - 36.0 g/dL Final   RDW 81/19/1478 13.4  11.5 - 15.5 % Final   Platelets 03/16/2023 446 (H)  150 - 400 K/uL Final   nRBC 03/16/2023 0.0  0.0 - 0.2 % Final  Neutrophils Relative % 03/16/2023 48  % Final   Neutro Abs 03/16/2023 3.9  1.7 - 7.7 K/uL Final   Lymphocytes Relative 03/16/2023 34  % Final   Lymphs Abs 03/16/2023 2.8  0.7 - 4.0 K/uL Final   Monocytes Relative 03/16/2023 8  % Final   Monocytes Absolute 03/16/2023 0.7  0.1 - 1.0 K/uL Final   Eosinophils Relative 03/16/2023 9  % Final   Eosinophils Absolute 03/16/2023 0.8 (H)  0.0 - 0.5 K/uL Final   Basophils Relative 03/16/2023 1  % Final   Basophils Absolute 03/16/2023 0.1  0.0 - 0.1 K/uL Final   Immature Granulocytes 03/16/2023 0  % Final   Abs Immature Granulocytes 03/16/2023 0.02  0.00 - 0.07 K/uL Final   Performed at [facility], [address] [place], [place] [address]   Sodium 03/16/2023 135  135 - 145 mmol/L Final   Potassium 03/16/2023 4.2  3.5 - 5.1 mmol/L Final   Chloride 03/16/2023 103  98 - 111 mmol/L Final   CO2 03/16/2023 23  22 - 32 mmol/L Final   Glucose, Bld 03/16/2023 93  70 - 99 mg/dL Final   Glucose reference range applies only to samples taken after fasting for at least 8 hours.   BUN 03/16/2023 15  6 - 20 mg/dL Final   Creatinine, Ser 03/16/2023 0.59 (L)  0.61 - 1.24 mg/dL Final   Calcium 60/45/4098 9.1  8.9 - 10.3 mg/dL Final   Total Protein 11/91/4782 7.0  6.5 - 8.1 g/dL Final   Albumin 95/62/1308 3.8  3.5 - 5.0 g/dL Final   AST 65/78/4696 39  15 - 41 U/L Final   ALT 03/16/2023 39  0 - 44 U/L Final   Alkaline Phosphatase 03/16/2023 127 (H)  38 - 126 U/L Final   Total Bilirubin 03/16/2023 0.6  0.3 - 1.2 mg/dL Final   GFR, Estimated 03/16/2023 >60  >60 mL/min Final   Comment: (NOTE) Calculated using the CKD-EPI Creatinine Equation (2021)    Anion gap 03/16/2023 9  5 - 15 Final   Performed at [facility], [address] [place], [place] [address]  Admission on 03/13/2023, Discharged on 03/15/2023  Component Date Value Ref Range Status   SARS Coronavirus 2 by RT PCR 03/14/2023 NEGATIVE  NEGATIVE Final   Comment: (NOTE) SARS-CoV-2 target nucleic acids are NOT DETECTED.  The SARS-CoV-2 RNA is generally detectable in upper respiratory specimens during the acute phase of infection. The lowest concentration of SARS-CoV-2 viral copies this assay can detect is 138 copies/mL. A negative result does not preclude SARS-Cov-2 infection and should not be used as the sole basis for treatment or other patient management decisions. A negative result may occur with  improper specimen collection/handling, submission of specimen other than nasopharyngeal swab, presence of viral mutation(s) within the areas targeted by this assay, and inadequate number of viral copies(<138 copies/mL). A negative result must be combined with clinical observations, patient history, and epidemiological information. The expected result is Negative.  Fact Sheet for Patients:  BloggerCourse.com  Fact Sheet for Healthcare Providers:  SeriousBroker.it  This test is no                          t yet approved or cleared by the [place] FDA and  has been authorized for detection and/or diagnosis of SARS-CoV-2 by FDA under an Emergency Use Authorization (EUA). This EUA will remain  in effect (meaning this test  can be used) for the duration of the COVID-19 declaration under Section 564(b)(1) of the Act, 21 U.S.C.section 360bbb-3(b)(1), unless the authorization is terminated  or revoked sooner.       Influenza A by PCR 03/14/2023 NEGATIVE  NEGATIVE Final   Influenza B by PCR 03/14/2023 NEGATIVE  NEGATIVE Final   Comment: (NOTE) The Xpert Xpress SARS-CoV-2/FLU/RSV plus assay is intended as an aid in the diagnosis of influenza from Nasopharyngeal swab specimens and should not be used as  a sole basis for treatment. Nasal washings and aspirates are unacceptable for Xpert Xpress SARS-CoV-2/FLU/RSV testing.  Fact Sheet for Patients: BloggerCourse.com  Fact Sheet for Healthcare Providers: SeriousBroker.it  This test is not yet approved or cleared by the [place] FDA and has been authorized for detection and/or diagnosis of SARS-CoV-2 by FDA under an Emergency Use Authorization (EUA). This EUA will remain in effect (meaning this test can be used) for the duration of the COVID-19 declaration under Section 564(b)(1) of the Act, 21 U.S.C. section 360bbb-3(b)(1), unless the authorization is terminated or revoked.     Resp Syncytial Virus by PCR 03/14/2023 NEGATIVE  NEGATIVE Final   Comment: (NOTE) Fact Sheet for Patients: BloggerCourse.com  Fact Sheet for Healthcare Providers: SeriousBroker.it  This test is not yet approved or cleared by the [place] FDA and has been authorized for detection and/or diagnosis of SARS-CoV-2 by FDA under an Emergency Use Authorization (EUA). This EUA will remain in effect (meaning this test can be used) for the duration of the COVID-19 declaration under Section 564(b)(1) of the Act, 21 U.S.C. section 360bbb-3(b)(1), unless the authorization is terminated or revoked.  Performed at [facility], [address], [place], [place] [address]   Admission on 03/12/2023, Discharged on 03/13/2023  Component Date Value Ref Range Status   Sodium 03/12/2023 139  135 - 145 mmol/L Final   Potassium 03/12/2023 3.8  3.5 - 5.1 mmol/L Final   Chloride 03/12/2023 110  98 - 111 mmol/L Final   CO2 03/12/2023 21 (L)  22 - 32 mmol/L Final   Glucose, Bld 03/12/2023 113 (H)  70 - 99 mg/dL Final   Glucose reference range applies only to samples taken after fasting for at least 8 hours.   BUN 03/12/2023 14  6 - 20 mg/dL Final    Creatinine, Ser 03/12/2023 0.77  0.61 - 1.24 mg/dL Final   Calcium 11/91/4782 8.7 (L)  8.9 - 10.3 mg/dL Final   Total Protein 95/62/1308 6.0 (L)  6.5 - 8.1 g/dL Final   Albumin 65/78/4696 3.4 (L)  3.5 - 5.0 g/dL Final   AST 29/52/8413 57 (H)  15 - 41 U/L Final   ALT 03/12/2023 42  0 - 44 U/L Final   Alkaline Phosphatase 03/12/2023 112  38 - 126 U/L Final   Total Bilirubin 03/12/2023 0.5  0.3 - 1.2 mg/dL Final   GFR, Estimated 03/12/2023 >60  >60 mL/min Final   Comment: (NOTE) Calculated using the CKD-EPI Creatinine Equation (2021)    Anion gap 03/12/2023 8  5 - 15 Final   Performed at [facility], [address], [place], [place] [address]   Alcohol, Ethyl (B) 03/12/2023 <10  <10 mg/dL Final   Comment: (NOTE) Lowest detectable limit for serum alcohol is 10 mg/dL.  For medical purposes only. Performed at [facility], [address], [place], [place] [address]    Opiates 03/12/2023 NONE DETECTED  NONE DETECTED Final   Cocaine 03/12/2023 NONE DETECTED  NONE DETECTED Final   Benzodiazepines 03/12/2023 NONE DETECTED  NONE DETECTED Final   Amphetamines 03/12/2023 POSITIVE (A)  NONE DETECTED Final   Tetrahydrocannabinol 03/12/2023 POSITIVE (A)  NONE DETECTED Final   Barbiturates 03/12/2023 NONE DETECTED  NONE DETECTED Final   Comment: (NOTE) DRUG SCREEN FOR MEDICAL PURPOSES ONLY.  IF CONFIRMATION IS NEEDED FOR ANY PURPOSE, NOTIFY LAB WITHIN 5 DAYS.  LOWEST DETECTABLE LIMITS FOR URINE DRUG SCREEN Drug Class                     Cutoff (ng/mL) Amphetamine and metabolites    1000 Barbiturate and metabolites    200 Benzodiazepine                 200 Opiates and metabolites        300 Cocaine and metabolites        300 THC                            50 Performed at [facility], [address] [place], [place] [address]    WBC 03/12/2023 7.4  4.0 - 10.5 K/uL Final   RBC 03/12/2023 3.82 (L)  4.22 - 5.81 MIL/uL Final   Hemoglobin 03/12/2023 10.8 (L)  13.0 - 17.0  g/dL Final   HCT 60/45/4098 32.7 (L)  39.0 - 52.0 % Final   MCV 03/12/2023 85.6  80.0 - 100.0 fL Final   MCH 03/12/2023 28.3  26.0 - 34.0 pg Final   MCHC 03/12/2023 33.0  30.0 - 36.0 g/dL Final   RDW 11/91/4782 13.6  11.5 - 15.5 % Final   Platelets 03/12/2023 353  150 - 400 K/uL Final   nRBC 03/12/2023 0.0  0.0 - 0.2 % Final   Neutrophils Relative % 03/12/2023 66  % Final   Neutro Abs 03/12/2023 4.9  1.7 - 7.7 K/uL Final   Lymphocytes Relative 03/12/2023 19  % Final   Lymphs Abs 03/12/2023 1.4  0.7 - 4.0 K/uL Final   Monocytes Relative 03/12/2023 7  % Final   Monocytes Absolute 03/12/2023 0.5  0.1 - 1.0 K/uL Final   Eosinophils Relative 03/12/2023 7  % Final   Eosinophils Absolute 03/12/2023 0.6 (H)  0.0 - 0.5 K/uL Final   Basophils Relative 03/12/2023 1  % Final   Basophils Absolute 03/12/2023 0.1  0.0 - 0.1 K/uL Final   Immature Granulocytes 03/12/2023 0  % Final   Abs Immature Granulocytes 03/12/2023 0.01  0.00 - 0.07 K/uL Final   Performed at [facility], [address], [place], [place] [address]   Salicylate Lvl 03/12/2023 <7.0 (L)  7.0 - 30.0 mg/dL Final   Performed at [facility], [address] [place], [place] [address]   Acetaminophen (Tylenol), Serum 03/12/2023 <10 (L)  10 - 30 ug/mL Final   Comment: (NOTE) Therapeutic concentrations vary significantly. A range of 10-30 ug/mL  may be an effective concentration for many patients. However, some  are best treated at concentrations outside of this range. Acetaminophen concentrations >150 ug/mL at 4 hours after ingestion  and >50 ug/mL at 12 hours after ingestion are often associated with  toxic reactions.  Performed at [facility], [address] [place], [place] [address]    Lactic Acid, Venous 03/12/2023 1.5  0.5 - 1.9 mmol/L Final   Performed at [facility], [address] [place] [address], [place], [place] [address]   Color,  Urine 03/12/2023 YELLOW  YELLOW Final   APPearance 03/12/2023 HAZY (A)  CLEAR  Final   Specific Gravity, Urine 03/12/2023 1.027  1.005 - 1.030 Final   pH 03/12/2023 5.0  5.0 - 8.0 Final   Glucose, UA 03/12/2023 NEGATIVE  NEGATIVE mg/dL Final   Hgb urine dipstick 03/12/2023 MODERATE (A)  NEGATIVE Final   Bilirubin Urine 03/12/2023 SMALL (A)  NEGATIVE Final   Ketones, ur 03/12/2023 5 (A)  NEGATIVE mg/dL Final   Protein, ur 16/09/9603 30 (A)  NEGATIVE mg/dL Final   Nitrite 54/08/8118 NEGATIVE  NEGATIVE Final   Leukocytes,Ua 03/12/2023 SMALL (A)  NEGATIVE Final   RBC / HPF 03/12/2023 >50  0 - 5 RBC/hpf Final   WBC, UA 03/12/2023 21-50  0 - 5 WBC/hpf Final   Bacteria, UA 03/12/2023 MANY (A)  NONE SEEN Final   Squamous Epithelial / HPF 03/12/2023 0-5  0 - 5 /HPF Final   Mucus 03/12/2023 PRESENT   Final   Ca Oxalate Crys, UA 03/12/2023 PRESENT   Final   Performed at [facility], [address], [place], [place] [address]  Admission on 03/11/2023, Discharged on 03/12/2023  Component Date Value Ref Range Status   Lipase 03/11/2023 23  11 - 51 U/L Final   Performed at [facility], [address] [place], [place] [address]   Sodium 03/11/2023 139  135 - 145 mmol/L Final   Potassium 03/11/2023 3.0 (L)  3.5 - 5.1 mmol/L Final   Chloride 03/11/2023 108  98 - 111 mmol/L Final   CO2 03/11/2023 22  22 - 32 mmol/L Final   Glucose, Bld 03/11/2023 116 (H)  70 - 99 mg/dL Final   Glucose reference range applies only to samples taken after fasting for at least 8 hours.   BUN 03/11/2023 13  6 - 20 mg/dL Final   Creatinine, Ser 03/11/2023 0.63  0.61 - 1.24 mg/dL Final   Calcium 30/86/5784 8.5 (L)  8.9 - 10.3 mg/dL Final   Total Protein 69/62/9528 6.5  6.5 - 8.1 g/dL Final   Albumin 41/32/4401 3.7  3.5 - 5.0 g/dL Final   AST 02/72/5366 63 (H)  15 - 41 U/L Final   ALT 03/11/2023 41  0 - 44 U/L Final   Alkaline Phosphatase 03/11/2023 115  38 - 126 U/L Final   Total Bilirubin 03/11/2023 0.6  0.3 - 1.2 mg/dL Final   GFR, Estimated 03/11/2023 >60  >60 mL/min  Final   Comment: (NOTE) Calculated using the CKD-EPI Creatinine Equation (2021)    Anion gap 03/11/2023 9  5 - 15 Final   Performed at [facility], [address], [place], [place] [address]   WBC 03/11/2023 10.6 (H)  4.0 - 10.5 K/uL Final   RBC 03/11/2023 4.22  4.22 - 5.81 MIL/uL Final   Hemoglobin 03/11/2023 11.6 (L)  13.0 - 17.0 g/dL Final   HCT 74/25/9563 36.5 (L)  39.0 - 52.0 % Final   MCV 03/11/2023 86.5  80.0 - 100.0 fL Final   MCH 03/11/2023 27.5  26.0 - 34.0 pg Final   MCHC 03/11/2023 31.8  30.0 - 36.0 g/dL Final   RDW 87/56/4332 13.4  11.5 - 15.5 % Final   Platelets 03/11/2023 351  150 - 400 K/uL Final   nRBC 03/11/2023 0.0  0.0 - 0.2 % Final   Performed at [facility], [address], [place], [place] [address]   Acetaminophen (Tylenol), Serum 03/11/2023 <10 (L)  10 -  30 ug/mL Final   Comment: (NOTE) Therapeutic concentrations vary significantly. A range of 10-30 ug/mL  may be an effective concentration for many patients. However, some  are best treated at concentrations outside of this range. Acetaminophen concentrations >150 ug/mL at 4 hours after ingestion  and >50 ug/mL at 12 hours after ingestion are often associated with  toxic reactions.  Performed at [facility], [address], [place], [place] [address]    Salicylate Lvl 03/11/2023 <7.0 (L)  7.0 - 30.0 mg/dL Final   Performed at [facility], [address], [place], [place] [address]   Alcohol, Ethyl (B) 03/11/2023 <10  <10 mg/dL Final   Comment: (NOTE) Lowest detectable limit for serum alcohol is 10 mg/dL.  For medical purposes only. Performed at [facility], [address], [place], [place] [address]    Lactic Acid, Venous 03/11/2023 1.0  0.5 - 1.9 mmol/L Final   Performed at [facility], [address], [place], [place] [address]   Specimen Description 03/11/2023    Final                    Value:BLOOD BLOOD RIGHT ARM Performed at [facility], [address], [place], [place] [address]    Special Requests 03/11/2023    Final                   Value:BOTTLES DRAWN AEROBIC AND ANAEROBIC Blood Culture adequate volume Performed at [facility], [address], [place], [place] [address]    Culture 03/11/2023    Final                   Value:NO GROWTH 5 DAYS Performed at [facility], [address] [place], [place] [address]    Report Status 03/11/2023 03/17/2023 FINAL   Final  Admission on 01/28/2023, Discharged on 02/06/2023  Component Date Value Ref Range Status   Sodium 01/28/2023 132 (L)  135 - 145 mmol/L Final   Potassium 01/28/2023 3.4 (L)  3.5 - 5.1 mmol/L Final   Chloride 01/28/2023 100  98 - 111 mmol/L Final   CO2 01/28/2023 22  22 - 32 mmol/L Final   Glucose, Bld 01/28/2023 84  70 - 99 mg/dL Final   Glucose reference range applies only to samples taken after fasting for at least 8 hours.   BUN 01/28/2023 11  6 - 20 mg/dL Final   Creatinine, Ser 01/28/2023 0.56 (L)  0.61 - 1.24 mg/dL Final   Calcium 96/29/5284 8.9  8.9 - 10.3 mg/dL Final   Total Protein 13/24/4010 7.0  6.5 - 8.1 g/dL Final   Albumin 27/25/3664 4.0  3.5 - 5.0 g/dL Final   AST 40/34/7425 41  15 - 41 U/L Final   ALT 01/28/2023 38  0 - 44 U/L Final   Alkaline Phosphatase 01/28/2023 110  38 - 126 U/L Final   Total Bilirubin 01/28/2023 1.3 (H)  0.3 - 1.2 mg/dL Final   GFR, Estimated 01/28/2023 >60  >60 mL/min Final   Comment: (NOTE) Calculated using the CKD-EPI Creatinine Equation (2021)    Anion gap 01/28/2023 10  5 - 15 Final   Performed at [facility], [address] [place], [place] [address]   WBC 01/28/2023 10.1  4.0 - 10.5 K/uL Final   RBC 01/28/2023 4.50  4.22 - 5.81 MIL/uL Final   Hemoglobin 01/28/2023 12.9 (L)  13.0 - 17.0  g/dL Final   HCT 04/54/0981 39.5  39.0 - 52.0 % Final   MCV 01/28/2023 87.8  80.0 - 100.0 fL  Final   MCH 01/28/2023 28.7  26.0 - 34.0 pg Final   MCHC 01/28/2023 32.7  30.0 - 36.0 g/dL Final   RDW 19/14/7829 11.9  11.5 - 15.5 % Final   Platelets 01/28/2023 429 (H)  150 - 400 K/uL Final   nRBC 01/28/2023 0.0  0.0 - 0.2 % Final   Neutrophils Relative % 01/28/2023 49  % Final   Neutro Abs 01/28/2023 5.0  1.7 - 7.7 K/uL Final   Lymphocytes Relative 01/28/2023 31  % Final   Lymphs Abs 01/28/2023 3.1  0.7 - 4.0 K/uL Final   Monocytes Relative 01/28/2023 13  % Final   Monocytes Absolute 01/28/2023 1.3 (H)  0.1 - 1.0 K/uL Final   Eosinophils Relative 01/28/2023 6  % Final   Eosinophils Absolute 01/28/2023 0.6 (H)  0.0 - 0.5 K/uL Final   Basophils Relative 01/28/2023 1  % Final   Basophils Absolute 01/28/2023 0.1  0.0 - 0.1 K/uL Final   Immature Granulocytes 01/28/2023 0  % Final   Abs Immature Granulocytes 01/28/2023 0.04  0.00 - 0.07 K/uL Final   Performed at [facility], [address], [place], [place] [address]   Specimen Description 01/28/2023    Final                   Value:BLOOD LEFT HAND Performed at [facility], [address], [place], [place] [address]    Special Requests 01/28/2023    Final                   Value:BOTTLES DRAWN AEROBIC AND ANAEROBIC Blood Culture results may not be optimal due to an excessive volume of blood received in culture bottles Performed at [facility], [address] [place], [place] [address]    Culture 01/28/2023    Final                   Value:NO GROWTH 5 DAYS Performed at [facility], [address], [place], [place] [address]    Report Status 01/28/2023 02/02/2023 FINAL   Final   Specimen Description 01/28/2023    Final                   Value:BLOOD RIGHT FOREARM Performed at [facility], [address], [place], [place] [address]    Special Requests 01/28/2023    Final                   Value:BOTTLES DRAWN AEROBIC AND ANAEROBIC Blood Culture adequate volume Performed at  [facility], [address] [place], [place] [address]    Culture 01/28/2023    Final                   Value:NO GROWTH 5 DAYS Performed at [facility], [address] [place], [place] [address]    Report Status 01/28/2023 02/02/2023 FINAL   Final   Lactic Acid, Venous 01/28/2023 1.4  0.5 - 1.9 mmol/L Final   Performed at [facility], [address], [place], [place] [address]   Opiates 01/28/2023 NONE DETECTED  NONE DETECTED Final   Cocaine 01/28/2023 NONE DETECTED  NONE DETECTED Final   Benzodiazepines 01/28/2023 NONE DETECTED  NONE DETECTED Final   Amphetamines 01/28/2023 POSITIVE (  A)  NONE DETECTED Final   Tetrahydrocannabinol 01/28/2023 POSITIVE (A)  NONE DETECTED Final   Barbiturates 01/28/2023 NONE DETECTED  NONE DETECTED Final   Comment: (NOTE) DRUG SCREEN FOR MEDICAL PURPOSES ONLY.  IF CONFIRMATION IS NEEDED FOR ANY PURPOSE, NOTIFY LAB WITHIN 5 DAYS.  LOWEST DETECTABLE LIMITS FOR URINE DRUG SCREEN Drug Class                     Cutoff (ng/mL) Amphetamine and metabolites    1000 Barbiturate and metabolites    200 Benzodiazepine                 200 Opiates and metabolites        300 Cocaine and metabolites        300 THC                            50 Performed at [facility], [address], [place], [place] [address]    Color, Urine 01/28/2023 YELLOW  YELLOW Final   APPearance 01/28/2023 CLEAR  CLEAR Final   Specific Gravity, Urine 01/28/2023 1.019  1.005 - 1.030 Final   pH 01/28/2023 6.0  5.0 - 8.0 Final   Glucose, UA 01/28/2023 NEGATIVE  NEGATIVE mg/dL Final   Hgb urine dipstick 01/28/2023 NEGATIVE  NEGATIVE Final   Bilirubin Urine 01/28/2023 NEGATIVE  NEGATIVE Final   Ketones, ur 01/28/2023 20 (A)  NEGATIVE mg/dL Final   Protein, ur 81/19/1478 30 (A)  NEGATIVE mg/dL Final   Nitrite 29/56/2130 NEGATIVE  NEGATIVE Final   Leukocytes,Ua 01/28/2023 NEGATIVE  NEGATIVE Final   RBC / HPF 01/28/2023  21-50  0 - 5 RBC/hpf Final   WBC, UA 01/28/2023 6-10  0 - 5 WBC/hpf Final   Bacteria, UA 01/28/2023 NONE SEEN  NONE SEEN Final   Squamous Epithelial / HPF 01/28/2023 0-5  0 - 5 /HPF Final   Mucus 01/28/2023 PRESENT   Final   Ca Oxalate Crys, UA 01/28/2023 PRESENT   Final   Performed at [facility], [address] [place], [place] [address]   WBC 01/29/2023 5.8  4.0 - 10.5 K/uL Final   RBC 01/29/2023 3.62 (L)  4.22 - 5.81 MIL/uL Final   Hemoglobin 01/29/2023 10.9 (L)  13.0 - 17.0 g/dL Final   HCT 46/96/2952 33.2 (L)  39.0 - 52.0 % Final   MCV 01/29/2023 91.7  80.0 - 100.0 fL Final   MCH 01/29/2023 30.1  26.0 - 34.0 pg Final   MCHC 01/29/2023 32.8  30.0 - 36.0 g/dL Final   RDW 84/13/2440 12.3  11.5 - 15.5 % Final   Platelets 01/29/2023 290  150 - 400 K/uL Final   nRBC 01/29/2023 0.0  0.0 - 0.2 % Final   Performed at [facility], [address], [place], [place] [address]   Sodium 01/29/2023 135  135 - 145 mmol/L Final   Potassium 01/29/2023 3.3 (L)  3.5 - 5.1 mmol/L Final   Chloride 01/29/2023 108  98 - 111 mmol/L Final   CO2 01/29/2023 20 (L)  22 - 32 mmol/L Final   Glucose, Bld 01/29/2023 147 (H)  70 - 99 mg/dL Final   Glucose reference range applies only to samples taken after fasting for at least 8 hours.   BUN 01/29/2023 8  6 - 20 mg/dL Final   Creatinine, Ser 01/29/2023 0.54 (L)  0.61 - 1.24 mg/dL Final   Calcium 53/66/4403  8.1 (L)  8.9 - 10.3 mg/dL Final   GFR, Estimated 01/29/2023 >60  >60 mL/min Final   Comment: (NOTE) Calculated using the CKD-EPI Creatinine Equation (2021)    Anion gap 01/29/2023 7  5 - 15 Final   Performed at [facility], [address], [place], [place] [address]   Magnesium 01/29/2023 2.1  1.7 - 2.4 mg/dL Final   Performed at [facility], [address], [place], [place] [address]   Phosphorus 01/29/2023 2.9  2.5 - 4.6 mg/dL Final   Performed at [facility], [address], [place], [place] [address]   Potassium 01/28/2023 3.2 (L)  3.5 - 5.1 mmol/L Final   Performed at [facility], [address] [place], [place] [address]   Magnesium 01/28/2023 1.8  1.7 - 2.4 mg/dL Final   Performed at [facility], [address], [place], [place] [address]   Sodium 01/30/2023 138  135 - 145 mmol/L Final   Potassium 01/30/2023 4.5  3.5 - 5.1 mmol/L Final   Chloride 01/30/2023 110  98 - 111 mmol/L Final   CO2 01/30/2023 20 (L)  22 - 32 mmol/L Final   Glucose, Bld 01/30/2023 119 (H)  70 - 99 mg/dL Final   Glucose reference range applies only to samples taken after fasting for at least 8 hours.   BUN 01/30/2023 7  6 - 20 mg/dL Final   Creatinine, Ser 01/30/2023 0.60 (L)  0.61 - 1.24 mg/dL Final   Calcium 09/30/2535 8.0 (L)  8.9 - 10.3 mg/dL Final   GFR, Estimated 01/30/2023 >60  >60 mL/min Final   Comment: (NOTE) Calculated using the CKD-EPI Creatinine Equation (2021)    Anion gap 01/30/2023 8  5 - 15 Final   Performed at [facility], [address] [place], [place] [address]   Magnesium 01/30/2023 1.5 (L)  1.7 - 2.4 mg/dL Final   Performed at [facility], [address] [place], [place] [address]   WBC 01/30/2023 4.1  4.0 - 10.5 K/uL Final   RBC 01/30/2023 3.74 (L)  4.22 - 5.81 MIL/uL Final   Hemoglobin 01/30/2023 11.0 (L)  13.0 - 17.0 g/dL Final   HCT 95/63/8756 33.5 (L)  39.0 - 52.0 % Final   MCV 01/30/2023 89.6  80.0 - 100.0 fL Final   MCH 01/30/2023 29.4  26.0 - 34.0 pg Final   MCHC 01/30/2023 32.8  30.0 - 36.0 g/dL Final   RDW 43/32/9518 12.4  11.5 - 15.5 % Final   Platelets 01/30/2023 254  150 - 400 K/uL Final   nRBC 01/30/2023 0.0  0.0 - 0.2 % Final   Performed at [facility], [address], [place], [place] [address]   Glucose-Capillary 01/30/2023 116 (H)  70 - 99 mg/dL Final   Glucose reference range applies only to samples taken  after fasting for at least 8 hours.   Comment 1 01/30/2023 Notify RN   Final   Comment 2 01/30/2023 Document in Chart   Final   WBC 01/31/2023 5.0  4.0 - 10.5 K/uL Final   RBC 01/31/2023 4.23  4.22 - 5.81 MIL/uL Final   Hemoglobin 01/31/2023 12.2 (L)  13.0 - 17.0 g/dL Final   HCT 06/02/1600 38.1 (L)  39.0 - 52.0 % Final   MCV 01/31/2023 90.1  80.0 - 100.0 fL Final   MCH 01/31/2023 28.8  26.0 - 34.0 pg Final   MCHC 01/31/2023 32.0  30.0 - 36.0 g/dL Final   RDW 47/82/9562 12.3  11.5 - 15.5 % Final   Platelets 01/31/2023 310  150 - 400 K/uL Final   nRBC 01/31/2023 0.0  0.0 - 0.2 % Final   Neutrophils Relative % 01/31/2023 48  % Final   Neutro Abs 01/31/2023 2.4  1.7 - 7.7 K/uL Final   Lymphocytes Relative 01/31/2023 33  % Final   Lymphs Abs 01/31/2023 1.6  0.7 - 4.0 K/uL Final   Monocytes Relative 01/31/2023 11  % Final   Monocytes Absolute 01/31/2023 0.6  0.1 - 1.0 K/uL Final   Eosinophils Relative 01/31/2023 7  % Final   Eosinophils Absolute 01/31/2023 0.3  0.0 - 0.5 K/uL Final   Basophils Relative 01/31/2023 1  % Final   Basophils Absolute 01/31/2023 0.1  0.0 - 0.1 K/uL Final   Immature Granulocytes 01/31/2023 0  % Final   Abs Immature Granulocytes 01/31/2023 0.02  0.00 - 0.07 K/uL Final   Performed at [facility], [address] [place], [place] [address]   Sodium 01/31/2023 136  135 - 145 mmol/L Final   Potassium 01/31/2023 3.6  3.5 - 5.1 mmol/L Final   Chloride 01/31/2023 102  98 - 111 mmol/L Final   CO2 01/31/2023 26  22 - 32 mmol/L Final   Glucose, Bld 01/31/2023 117 (H)  70 - 99 mg/dL Final   Glucose reference range applies only to samples taken after fasting for at least 8 hours.   BUN 01/31/2023 10  6 - 20 mg/dL Final   Creatinine, Ser 01/31/2023 0.55 (L)  0.61 - 1.24 mg/dL Final   Calcium 57/84/6962 8.4 (L)  8.9 - 10.3 mg/dL Final   GFR, Estimated 01/31/2023 >60  >60 mL/min Final   Comment: (NOTE) Calculated using the CKD-EPI Creatinine Equation  (2021)    Anion gap 01/31/2023 8  5 - 15 Final   Performed at [facility], [address] [place], [place] [address]   Magnesium 01/31/2023 1.8  1.7 - 2.4 mg/dL Final   Performed at [facility], [address], [place], [place] [address]   Phosphorus 01/31/2023 4.0  2.5 - 4.6 mg/dL Final   Performed at [facility], [address], [place], [place] [address]   WBC 02/04/2023 6.3  4.0 - 10.5 K/uL Final   RBC 02/04/2023 4.86  4.22 - 5.81 MIL/uL Final   Hemoglobin 02/04/2023 14.1  13.0 - 17.0 g/dL Final   HCT 25/36/6440 43.8  39.0 - 52.0 % Final   MCV 02/04/2023 90.1  80.0 - 100.0 fL Final   MCH 02/04/2023 29.0  26.0 - 34.0 pg Final   MCHC 02/04/2023 32.2  30.0 - 36.0 g/dL Final   RDW 34/74/2595 12.3  11.5 - 15.5 % Final   Platelets 02/04/2023 272  150 - 400 K/uL Final   nRBC 02/04/2023 0.0  0.0 - 0.2 % Final   Neutrophils Relative % 02/04/2023 35  % Final   Neutro Abs 02/04/2023 2.2  1.7 - 7.7 K/uL Final   Lymphocytes Relative 02/04/2023 41  % Final   Lymphs Abs 02/04/2023 2.6  0.7 - 4.0 K/uL Final   Monocytes Relative 02/04/2023 13  % Final   Monocytes Absolute 02/04/2023 0.8  0.1 - 1.0 K/uL Final   Eosinophils Relative 02/04/2023 9  % Final   Eosinophils Absolute 02/04/2023 0.6 (H)  0.0 - 0.5 K/uL Final   Basophils Relative 02/04/2023 1  % Final   Basophils  Absolute 02/04/2023 0.1  0.0 - 0.1 K/uL Final   Immature Granulocytes 02/04/2023 1  % Final   Abs Immature Granulocytes 02/04/2023 0.03  0.00 - 0.07 K/uL Final   Performed at [facility], [address], [place], [place] [address]   Sodium 02/04/2023 134 (L)  135 - 145 mmol/L Final   Potassium 02/04/2023 4.2  3.5 - 5.1 mmol/L Final   Chloride 02/04/2023 100  98 - 111 mmol/L Final   CO2 02/04/2023 25  22 - 32 mmol/L Final   Glucose, Bld 02/04/2023 111 (H)  70 - 99 mg/dL Final   Glucose reference range applies only to samples taken after fasting for  at least 8 hours.   BUN 02/04/2023 18  6 - 20 mg/dL Final   Creatinine, Ser 02/04/2023 0.57 (L)  0.61 - 1.24 mg/dL Final   Calcium 62/56/3893 9.0  8.9 - 10.3 mg/dL Final   GFR, Estimated 02/04/2023 >60  >60 mL/min Final   Comment: (NOTE) Calculated using the CKD-EPI Creatinine Equation (2021)    Anion gap 02/04/2023 9  5 - 15 Final   Performed at [facility], [address], [place], [place] [address]   Magnesium 02/04/2023 1.9  1.7 - 2.4 mg/dL Final   Performed at [facility], [address] [place], [place] [address]   TSH 02/04/2023 4.836 (H)  0.350 - 4.500 uIU/mL Final   Comment: Performed by a 3rd Generation assay with a functional sensitivity of <=0.01 uIU/mL. Performed at [facility], [address], [place], [place] [address]   Admission on 01/10/2023, Discharged on 01/11/2023  Component Date Value Ref Range Status   Glucose-Capillary 01/10/2023 123 (H)  70 - 99 mg/dL Final   Glucose reference range applies only to samples taken after fasting for at least 8 hours.   Sodium 01/10/2023 137  135 - 145 mmol/L Final   Potassium 01/10/2023 4.7  3.5 - 5.1 mmol/L Final   HEMOLYSIS AT THIS LEVEL MAY AFFECT RESULT   Chloride 01/10/2023 103  98 - 111 mmol/L Final   CO2 01/10/2023 24  22 - 32 mmol/L Final   Glucose, Bld 01/10/2023 105 (H)  70 - 99 mg/dL Final   Glucose reference range applies only to samples taken after fasting for at least 8 hours.   BUN 01/10/2023 8  6 - 20 mg/dL Final   Creatinine, Ser 01/10/2023 0.65  0.61 - 1.24 mg/dL Final   Calcium 03/55/9741 8.9  8.9 - 10.3 mg/dL Final   Total Protein 63/84/5364 6.9  6.5 - 8.1 g/dL Final   Albumin 68/02/2121 3.7  3.5 - 5.0 g/dL Final   AST 48/25/0037 82 (H)  15 - 41 U/L Final   HEMOLYSIS AT THIS LEVEL MAY AFFECT RESULT   ALT 01/10/2023 72 (H)  0 - 44 U/L Final   HEMOLYSIS AT THIS LEVEL MAY AFFECT RESULT   Alkaline Phosphatase 01/10/2023 158 (H)  38 - 126 U/L Final    Total Bilirubin 01/10/2023 1.1  0.3 - 1.2 mg/dL Final   HEMOLYSIS AT THIS LEVEL MAY AFFECT RESULT   GFR, Estimated 01/10/2023 >60  >60 mL/min Final   Comment: (NOTE) Calculated using the CKD-EPI Creatinine Equation (2021)    Anion gap 01/10/2023 10  5 - 15 Final   Performed at [facility], [address] [place], [place] [address]   Salicylate Lvl 01/10/2023 <7.0 (L)  7.0 - 30.0 mg/dL Final   Performed at [facility], [address] [place], [place] [address]   Acetaminophen (Tylenol), Serum 01/10/2023 <10 (L)  10 - 30 ug/mL Final   Comment: (NOTE) Therapeutic concentrations vary significantly. A range of 10-30 ug/mL  may be an effective concentration for many patients. However, some  are best treated at concentrations outside of this range. Acetaminophen concentrations >150 ug/mL at 4 hours after ingestion  and >50 ug/mL at 12 hours after ingestion are often associated with  toxic reactions.  Performed at [facility], [address] [place], [place] [address]    Alcohol, Ethyl (B) 01/10/2023 <10  <10 mg/dL Final   Comment: (NOTE) Lowest detectable limit for serum alcohol is 10 mg/dL.  For medical purposes only. Performed at [facility], [address], [place], [place] [address]    WBC 01/10/2023 13.4 (H)  4.0 - 10.5 K/uL Final   RBC 01/10/2023 4.61  4.22 - 5.81 MIL/uL Final   Hemoglobin 01/10/2023 13.9  13.0 - 17.0 g/dL Final   HCT 91/47/8295 41.7  39.0 - 52.0 % Final   MCV 01/10/2023 90.5  80.0 - 100.0 fL Final   MCH 01/10/2023 30.2  26.0 - 34.0 pg Final   MCHC 01/10/2023 33.3  30.0 - 36.0 g/dL Final   RDW 62/13/0865 11.9  11.5 - 15.5 % Final   Platelets 01/10/2023 290  150 - 400 K/uL Final   nRBC 01/10/2023 0.0  0.0 - 0.2 % Final   Neutrophils Relative % 01/10/2023 79  % Final   Neutro Abs 01/10/2023 10.7 (H)  1.7 - 7.7 K/uL Final   Lymphocytes Relative 01/10/2023 12  % Final   Lymphs Abs 01/10/2023 1.6  0.7 - 4.0 K/uL Final   Monocytes  Relative 01/10/2023 6  % Final   Monocytes Absolute 01/10/2023 0.7  0.1 - 1.0 K/uL Final   Eosinophils Relative 01/10/2023 2  % Final   Eosinophils Absolute 01/10/2023 0.2  0.0 - 0.5 K/uL Final   Basophils Relative 01/10/2023 1  % Final   Basophils Absolute 01/10/2023 0.1  0.0 - 0.1 K/uL Final   Immature Granulocytes 01/10/2023 0  % Final   Abs Immature Granulocytes 01/10/2023 0.04  0.00 - 0.07 K/uL Final   Performed at [facility], [address], [place], [place] [address]  Admission on 12/12/2022, Discharged on 12/12/2022  Component Date Value Ref Range Status   Glucose-Capillary 12/12/2022 45 (L)  70 - 99 mg/dL Final   Glucose reference range applies only to samples taken after fasting for at least 8 hours.   Glucose-Capillary 12/12/2022 134 (H)  70 - 99 mg/dL Final   Glucose reference range applies only to samples taken after fasting for at least 8 hours.    Blood Alcohol level:  Lab Results  Component Value Date   ETH <10 03/12/2023   ETH <10 03/11/2023    Metabolic Disorder Labs: No results found for: "HGBA1C", "MPG" No results found for: "PROLACTIN" Lab Results  Component Value Date   TRIG 189 (H) 11/18/2021    Therapeutic Lab Levels: No results found for: "LITHIUM" No results found for: "VALPROATE" No results found for: "CBMZ"  Physical Findings   CAGE-AID    Flowsheet Row ED to Hosp-Admission (Discharged) from 09/27/2021 in [facility]  CAGE-AID Score 3      PHQ2-9    Flowsheet Row ED from 03/15/2023 in [facility]  PHQ-2 Total Score 6  PHQ-9 Total Score 22  Flowsheet Row ED from 03/15/2023 in [facility] ED from 03/13/2023 in [facility] at [facility] ED from 03/12/2023 in [facility] Emergency Department at [facility]  C-SSRS RISK CATEGORY No Risk No Risk No Risk        Musculoskeletal  Strength & Muscle  Tone: within normal limits Gait & Station: normal Patient leans: N/A  Psychiatric Specialty Exam  Presentation  General Appearance:  Appropriate for Environment; Casual  Eye Contact: Minimal  Speech: Clear and Coherent  Speech Volume: Decreased  Handedness:No data recorded  Mood and Affect  Mood: Anxious  Affect: Appropriate   Thought Process  Thought Processes: Goal Directed  Descriptions of Associations:Intact  Orientation:Full (Time, Place and Person)  Thought Content:Logical     Hallucinations:Hallucinations: None  Ideas of Reference:None  Suicidal Thoughts:Suicidal Thoughts: No  Homicidal Thoughts:Homicidal Thoughts: No   Sensorium  Memory: Immediate Fair; Recent Fair  Judgment: Impaired  Insight: Shallow   Executive Functions  Concentration: Fair  Attention Span: Fair  Recall: Fair  Fund of Knowledge: Fair  Language: Fair; Good   Psychomotor Activity  Psychomotor Activity: Psychomotor Activity: Decreased   Assets  Assets: Desire for Improvement; Resilience   Sleep  Sleep: Sleep: Fair   No data recorded  Physical Exam  Physical Exam Constitutional:      Appearance: Normal appearance.  HENT:     Head: Normocephalic and atraumatic.  Pulmonary:     Effort: Pulmonary effort is normal.  Neurological:     Mental Status: He is alert and oriented to person, place, and time.    ROS Blood pressure (!) 108/55, pulse 65, temperature 97.8 F (36.6 C), resp. rate 16, SpO2 97 %. There is no height or weight on file to calculate BMI.  Treatment Plan Summary: Daily contact with patient to assess and evaluate symptoms and progress in treatment and Medication management   Patient endorses no cravings with clonidine; however he has had multiple hypotensive pressures and endorses ome lightheaded with standing. Will have to dc medication. Patient endorses feeling his mood benefits from his Seroquel and gabapentin and  reports a hx dx of Bipolar d/o. Seroquel dose will be decreased to accommodate rules at [facility].  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

## 2023-03-17 NOTE — ED Notes (Signed)
Pt is in the Dayroom watching TV. Respirations are even and unlabored. No acute distress noted. Will continue to monitor for safety.  

## 2023-03-17 NOTE — Group Note (Signed)
Group Topic: Relapse and Recovery  Group Date: 03/17/2023 Start Time: 1030 End Time: 1100 Facilitators: Jenean Lindau, RN  Department: [facility]  Number of Participants: 4  Group Focus: chemical dependency education Treatment Modality:  Solution-Focused Therapy Interventions utilized were exploration and problem solving Purpose: enhance coping skills, explore maladaptive thinking, express feelings, and increase insight  Name: Jason Buchanan Date of Birth: 09-16-1986  MR: [id number]    Level of Participation: active Quality of Participation: cooperative and engaged Interactions with others: gave feedback Mood/Affect: appropriate Triggers (if applicable): n/a  Cognition: insightful and logical Progress: Moderate Response: positive  Plan: follow-up needed  Patients Problems:  Patient Active Problem List   Diagnosis Date Noted   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   Acute psychosis 01/28/2023   Lactic acidosis 01/14/2023   Abscess of left hand 01/13/2023   Elevated transaminase level 01/13/2023   Mood disorder 01/13/2023   Alcohol use 01/13/2023   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

## 2023-03-18 DIAGNOSIS — F1520 Other stimulant dependence, uncomplicated: Secondary | ICD-10-CM | POA: Diagnosis not present

## 2023-03-18 NOTE — ED Notes (Signed)
Pt c/o generalized muscle aches and pain 5/10. Pt received sx relief medication and returned to his room. Safety maintained.

## 2023-03-18 NOTE — ED Notes (Signed)
Pt is in the dayroom watching TV with peers. Pt denies SI/HI/AVH. No acute distress noted. Will continue to monitor for safety. 

## 2023-03-18 NOTE — ED Notes (Signed)
Patient was provided with dinner 

## 2023-03-18 NOTE — ED Notes (Signed)
Pt sitting in bedroom. No acute distress noted. No concerns voiced. Informed pt to notify staff with any needs or assistance. Pt verbalized understanding or agreement. Will continue to monitor for safety. 

## 2023-03-18 NOTE — ED Notes (Signed)
Patient is sleeping. Respirations equal and unlabored, skin warm and dry. No change in assessment or acuity. Routine safety checks conducted according to facility protocol. Will continue to monitor for safety.   

## 2023-03-18 NOTE — ED Notes (Signed)
Patient A&Ox4. Denies intent to harm self/others when asked. Denies A/VH. Patient denies any physical complaints when asked. No acute distress noted. Support and encouragement provided. Routine safety checks conducted according to facility protocol. Encouraged patient to notify staff if thoughts of harm toward self or others arise. Patient verbalize understanding and agreement. Will continue to monitor for safety.    

## 2023-03-18 NOTE — ED Notes (Signed)
Pt is in the bed sleeping. Respirations are even and unlabored. No acute distress noted. Will continue to monitor for safety. 

## 2023-03-18 NOTE — ED Notes (Signed)
Pt sleeping in no acute distress. RR even and unlabored. Environment secured. Will continue to monitor for safety. 

## 2023-03-18 NOTE — ED Notes (Signed)
Patient was provided with breakfast 

## 2023-03-18 NOTE — ED Notes (Signed)
Snacks given 

## 2023-03-18 NOTE — Group Note (Signed)
Group Topic: Decisional Balance/Substance Abuse  Group Date: 03/18/2023 Start Time: 1515 End Time: 1610 Facilitators: Prentice Docker, RN  Department: [facility]  Number of Participants: 7  Group Focus: clarity of thought, daily focus, family, feeling awareness/expression, goals/reality orientation, personal responsibility, and self-awareness Treatment Modality:  Psychoeducation Interventions utilized were clarification and problem solving Purpose: enhance coping skills, improve communication skills, increase insight, regain self-worth, and relapse prevention strategies  Name: [name] Date of Birth: [birth year]  MR: [id number]    Level of Participation: active Quality of Participation: attentive, cooperative, engaged, and motivated Interactions with others: gave feedback Mood/Affect: appropriate and positive Triggers (if applicable): n/a Cognition: coherent/clear, goal directed, and logical Progress: Gaining insight Response: "I have to do this because I don't want my daughter to date or marry what I've become. I want my children to get to really know a person first. 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

## 2023-03-18 NOTE — ED Notes (Signed)
Patient was provided with lunch 

## 2023-03-18 NOTE — ED Provider Notes (Addendum)
Behavioral Health Progress Note  Date and Time: 03/18/2023 11:19 AM Name: DARIK MASSING MRN:  [id number]  Subjective:   Olyver Hawes is a  37 y.o., male patient presented to [facility] as a transfer from [facility], voluntarily, for admission to facility based crisis center to continue detox of heroin. He has a hx of substance use d/o and self- reported Bipolar d/o and PTSD. Of note, patient's UDS is positive for methamphetamines and THC only.    Objectively, provider saw patient when he was up to get breakfast and patient appeared to be doing fairly well, with no complaints other than annoyance about PHQ-9 (which he did feel out despite being handed one by provider). There were no signs of tremor or diaphoresis, and patient had limited interactions with cohort. Later during assessment, patient was laying in his room, with his oral mask over his eyes. He appeared to be fixated on endorsing a lot of symptoms that were not visible present. He is endorses having most symptoms when prompted by provider that relate to possible withdrawal. RN also noted that patient appears to be med seeking. This does appear to align with what provider saw yesterday, as patient will  appear fine but 1 on 1 endorses a multitude of symptoms for "withdrawal" and is upset when he does not receive clonidine. He then suddenly appears to get better and leave 1:1 interaction.  Patient reports on assessment today that he is "not doing well" and he reports that he has a headache on both sides of his temple. He also reports that he is having myalgias, and chills, and tremors. None of which have been seen by staff or provider today. He does deny SI, contracts for safety and denies HI and AVH. He reports that he feels irritable. He endorses some annoyance with not being providing clonidine despite "my blood pressure being a bit higher, but I guess it wasn't enough." He reports that his appetite is poor (seen eating by staff) and that he  has not told staff that he is having myalgias and headache.   Update @ lunch: patient completed his PHQ-9 and again asked provider for clonidine.   Diagnosis:  Final diagnoses:  Methamphetamine use disorder, severe, dependence    Total Time spent with patient: 20 minutes  Past Psychiatric History: Polysubstance Abuse, IV drug use, Hx of Suboxone treatment, not currently prescribed.  Past Medical History: Denies contributory medical history.  Family History: unknown Family Psychiatric  History: unknown Social History: unknown  Additional Social History:                         Sleep: Fair  Appetite:  Fair  Current Medications:  Current Facility-Administered Medications  Medication Dose Route Frequency Provider Last Rate Last Admin   alum & mag hydroxide-simeth (MAALOX/MYLANTA) 200-200-20 MG/5ML suspension 30 mL  30 mL Oral Q4H PRN Bing Neighbors, NP       dicyclomine (BENTYL) tablet 20 mg  20 mg Oral Q6H PRN Bing Neighbors, NP       gabapentin (NEURONTIN) capsule 300 mg  300 mg Oral TID Bing Neighbors, NP   300 mg at 03/18/23 0950   hydrOXYzine (ATARAX) tablet 50 mg  50 mg Oral Q6H PRN Bing Neighbors, NP   50 mg at 03/17/23 1941   loperamide (IMODIUM) capsule 2-4 mg  2-4 mg Oral PRN Bing Neighbors, NP       magnesium hydroxide (MILK  OF MAGNESIA) suspension 30 mL  30 mL Oral Daily PRN Bing Neighbors, NP       methocarbamol (ROBAXIN) tablet 500 mg  500 mg Oral Q8H PRN Bing Neighbors, NP   500 mg at 03/18/23 1112   naproxen (NAPROSYN) tablet 500 mg  500 mg Oral BID PRN Bing Neighbors, NP   500 mg at 03/18/23 1112   nicotine (NICODERM CQ - dosed in mg/24 hours) patch 21 mg  21 mg Transdermal Daily Bing Neighbors, NP   21 mg at 03/18/23 0949   ondansetron (ZOFRAN-ODT) disintegrating tablet 4 mg  4 mg Oral Q6H PRN Bing Neighbors, NP       QUEtiapine (SEROQUEL XR) 24 hr tablet 250 mg  250 mg Oral QHS Eliseo Gum B, MD   250 mg at  03/17/23 2129   traZODone (DESYREL) tablet 50 mg  50 mg Oral QHS Bing Neighbors, NP   50 mg at 03/17/23 2129   Current Outpatient Medications  Medication Sig Dispense Refill   albuterol (VENTOLIN HFA) 108 (90 Base) MCG/ACT inhaler Inhale 2 puffs into the lungs every 6 (six) hours as needed for wheezing or shortness of breath.     buprenorphine-naloxone (SUBOXONE) 8-2 mg SUBL SL tablet Place 1 tablet under the tongue 2 (two) times daily for 7 days 14 tablet 0   cyclobenzaprine (FLEXERIL) 10 MG tablet Take 10 mg by mouth 3 (three) times daily as needed for muscle spasms.     fluticasone (FLONASE ALLERGY RELIEF) 50 MCG/ACT nasal spray Place 1 spray into both nostrils in the morning and at bedtime.     folic acid (FOLVITE) 1 MG tablet Take 1 tablet (1 mg total) by mouth daily. 90 tablet 0   gabapentin (NEURONTIN) 300 MG capsule Take 1 capsule (300 mg total) by mouth 3 (three) times daily. 90 capsule 0   hydrOXYzine (ATARAX) 10 MG tablet Take 1 tablet (10 mg total) by mouth every 6 (six) hours as needed for anxiety. (Patient taking differently: Take 10 mg by mouth 3 (three) times daily.) 30 tablet 0   ibuprofen (ADVIL) 200 MG tablet Take 600-800 mg by mouth every 6 (six) hours as needed for mild pain or headache.     naloxone (NARCAN) nasal spray 4 mg/0.1 mL Take as needed for Narcotic overdose. (Patient not taking: Reported on 03/14/2023) 1 each 0   naproxen sodium (ALEVE) 220 MG tablet Take 220-440 mg by mouth 2 (two) times daily as needed (for pain or headaches).     OPCON-A 0.027-0.315 % SOLN Place 1 drop into both eyes 3 (three) times daily as needed (for itching).     pantoprazole (PROTONIX) 40 MG tablet Take 1 tablet (40 mg total) by mouth daily. (Patient taking differently: Take 40 mg by mouth daily before breakfast.) 30 tablet 0   polyethylene glycol powder (MIRALAX) 17 GM/SCOOP powder Take 17 g by mouth daily as needed (constipation). (Patient taking differently: Take 17 g by mouth daily as  needed for mild constipation.) 255 g 0   QUEtiapine (SEROQUEL XR) 300 MG 24 hr tablet Take 1 tablet (300 mg total) by mouth at bedtime. 30 tablet 0   senna (SENOKOT) 8.6 MG TABS tablet Take 2 tablets (17.2 mg total) by mouth at bedtime. (Patient not taking: Reported on 03/14/2023) 120 tablet 0   thiamine (VITAMIN B-1) 100 MG tablet Take 1 tablet (100 mg total) by mouth daily 100 tablet 3   ZYRTEC ALLERGY 10 MG tablet Take  10 mg by mouth at bedtime.      Labs  Lab Results:  Admission on 03/15/2023  Component Date Value Ref Range Status   WBC 03/16/2023 8.2  4.0 - 10.5 K/uL Final   RBC 03/16/2023 5.27  4.22 - 5.81 MIL/uL Final   Hemoglobin 03/16/2023 14.6  13.0 - 17.0 g/dL Final   HCT 16/09/9603 44.4  39.0 - 52.0 % Final   MCV 03/16/2023 84.3  80.0 - 100.0 fL Final   MCH 03/16/2023 27.7  26.0 - 34.0 pg Final   MCHC 03/16/2023 32.9  30.0 - 36.0 g/dL Final   RDW 54/08/8118 13.4  11.5 - 15.5 % Final   Platelets 03/16/2023 446 (H)  150 - 400 K/uL Final   nRBC 03/16/2023 0.0  0.0 - 0.2 % Final   Neutrophils Relative % 03/16/2023 48  % Final   Neutro Abs 03/16/2023 3.9  1.7 - 7.7 K/uL Final   Lymphocytes Relative 03/16/2023 34  % Final   Lymphs Abs 03/16/2023 2.8  0.7 - 4.0 K/uL Final   Monocytes Relative 03/16/2023 8  % Final   Monocytes Absolute 03/16/2023 0.7  0.1 - 1.0 K/uL Final   Eosinophils Relative 03/16/2023 9  % Final   Eosinophils Absolute 03/16/2023 0.8 (H)  0.0 - 0.5 K/uL Final   Basophils Relative 03/16/2023 1  % Final   Basophils Absolute 03/16/2023 0.1  0.0 - 0.1 K/uL Final   Immature Granulocytes 03/16/2023 0  % Final   Abs Immature Granulocytes 03/16/2023 0.02  0.00 - 0.07 K/uL Final   Performed at [facility], [address] [place], [place] [address]   Sodium 03/16/2023 135  135 - 145 mmol/L Final   Potassium 03/16/2023 4.2  3.5 - 5.1 mmol/L Final   Chloride 03/16/2023 103  98 - 111 mmol/L Final   CO2 03/16/2023 23  22 - 32 mmol/L Final   Glucose, Bld  03/16/2023 93  70 - 99 mg/dL Final   Glucose reference range applies only to samples taken after fasting for at least 8 hours.   BUN 03/16/2023 15  6 - 20 mg/dL Final   Creatinine, Ser 03/16/2023 0.59 (L)  0.61 - 1.24 mg/dL Final   Calcium 95/62/1308 9.1  8.9 - 10.3 mg/dL Final   Total Protein 65/78/4696 7.0  6.5 - 8.1 g/dL Final   Albumin 29/52/8413 3.8  3.5 - 5.0 g/dL Final   AST 24/40/1027 39  15 - 41 U/L Final   ALT 03/16/2023 39  0 - 44 U/L Final   Alkaline Phosphatase 03/16/2023 127 (H)  38 - 126 U/L Final   Total Bilirubin 03/16/2023 0.6  0.3 - 1.2 mg/dL Final   GFR, Estimated 03/16/2023 >60  >60 mL/min Final   Comment: (NOTE) Calculated using the CKD-EPI Creatinine Equation (2021)    Anion gap 03/16/2023 9  5 - 15 Final   Performed at [facility], [address] [place], [place] [address]  Admission on 03/13/2023, Discharged on 03/15/2023  Component Date Value Ref Range Status   SARS Coronavirus 2 by RT PCR 03/14/2023 NEGATIVE  NEGATIVE Final   Comment: (NOTE) SARS-CoV-2 target nucleic acids are NOT DETECTED.  The SARS-CoV-2 RNA is generally detectable in upper respiratory specimens during the acute phase of infection. The lowest concentration of SARS-CoV-2 viral copies this assay can detect is 138 copies/mL. A negative result does not preclude SARS-Cov-2 infection and should not be used as the sole basis for treatment or other patient management  decisions. A negative result may occur with  improper specimen collection/handling, submission of specimen other than nasopharyngeal swab, presence of viral mutation(s) within the areas targeted by this assay, and inadequate number of viral copies(<138 copies/mL). A negative result must be combined with clinical observations, patient history, and epidemiological information. The expected result is Negative.  Fact Sheet for Patients:  BloggerCourse.com  Fact Sheet for Healthcare Providers:   SeriousBroker.it  This test is no                          t yet approved or cleared by the [place] FDA and  has been authorized for detection and/or diagnosis of SARS-CoV-2 by FDA under an Emergency Use Authorization (EUA). This EUA will remain  in effect (meaning this test can be used) for the duration of the COVID-19 declaration under Section 564(b)(1) of the Act, 21 U.S.C.section 360bbb-3(b)(1), unless the authorization is terminated  or revoked sooner.       Influenza A by PCR 03/14/2023 NEGATIVE  NEGATIVE Final   Influenza B by PCR 03/14/2023 NEGATIVE  NEGATIVE Final   Comment: (NOTE) The Xpert Xpress SARS-CoV-2/FLU/RSV plus assay is intended as an aid in the diagnosis of influenza from Nasopharyngeal swab specimens and should not be used as a sole basis for treatment. Nasal washings and aspirates are unacceptable for Xpert Xpress SARS-CoV-2/FLU/RSV testing.  Fact Sheet for Patients: BloggerCourse.com  Fact Sheet for Healthcare Providers: SeriousBroker.it  This test is not yet approved or cleared by the [place] FDA and has been authorized for detection and/or diagnosis of SARS-CoV-2 by FDA under an Emergency Use Authorization (EUA). This EUA will remain in effect (meaning this test can be used) for the duration of the COVID-19 declaration under Section 564(b)(1) of the Act, 21 U.S.C. section 360bbb-3(b)(1), unless the authorization is terminated or revoked.     Resp Syncytial Virus by PCR 03/14/2023 NEGATIVE  NEGATIVE Final   Comment: (NOTE) Fact Sheet for Patients: BloggerCourse.com  Fact Sheet for Healthcare Providers: SeriousBroker.it  This test is not yet approved or cleared by the [place] FDA and has been authorized for detection and/or diagnosis of SARS-CoV-2 by FDA under an Emergency Use Authorization (EUA). This EUA  will remain in effect (meaning this test can be used) for the duration of the COVID-19 declaration under Section 564(b)(1) of the Act, 21 U.S.C. section 360bbb-3(b)(1), unless the authorization is terminated or revoked.  Performed at [facility], [address] [place], [place] [address]   Admission on 03/12/2023, Discharged on 03/13/2023  Component Date Value Ref Range Status   Sodium 03/12/2023 139  135 - 145 mmol/L Final   Potassium 03/12/2023 3.8  3.5 - 5.1 mmol/L Final   Chloride 03/12/2023 110  98 - 111 mmol/L Final   CO2 03/12/2023 21 (L)  22 - 32 mmol/L Final   Glucose, Bld 03/12/2023 113 (H)  70 - 99 mg/dL Final   Glucose reference range applies only to samples taken after fasting for at least 8 hours.   BUN 03/12/2023 14  6 - 20 mg/dL Final   Creatinine, Ser 03/12/2023 0.77  0.61 - 1.24 mg/dL Final   Calcium 43/32/9518 8.7 (L)  8.9 - 10.3 mg/dL Final   Total Protein 84/16/6063 6.0 (L)  6.5 - 8.1 g/dL Final   Albumin 01/60/1093 3.4 (L)  3.5 - 5.0 g/dL Final   AST 23/55/7322 57 (H)  15 - 41 U/L Final   ALT 03/12/2023  42  0 - 44 U/L Final   Alkaline Phosphatase 03/12/2023 112  38 - 126 U/L Final   Total Bilirubin 03/12/2023 0.5  0.3 - 1.2 mg/dL Final   GFR, Estimated 03/12/2023 >60  >60 mL/min Final   Comment: (NOTE) Calculated using the CKD-EPI Creatinine Equation (2021)    Anion gap 03/12/2023 8  5 - 15 Final   Performed at [facility], [address], [place], [place] [address]   Alcohol, Ethyl (B) 03/12/2023 <10  <10 mg/dL Final   Comment: (NOTE) Lowest detectable limit for serum alcohol is 10 mg/dL.  For medical purposes only. Performed at [facility], [address] [place], [place] [address]    Opiates 03/12/2023 NONE DETECTED  NONE DETECTED Final   Cocaine 03/12/2023 NONE DETECTED  NONE DETECTED Final   Benzodiazepines 03/12/2023 NONE DETECTED  NONE DETECTED Final   Amphetamines 03/12/2023 POSITIVE (A)  NONE DETECTED Final    Tetrahydrocannabinol 03/12/2023 POSITIVE (A)  NONE DETECTED Final   Barbiturates 03/12/2023 NONE DETECTED  NONE DETECTED Final   Comment: (NOTE) DRUG SCREEN FOR MEDICAL PURPOSES ONLY.  IF CONFIRMATION IS NEEDED FOR ANY PURPOSE, NOTIFY LAB WITHIN 5 DAYS.  LOWEST DETECTABLE LIMITS FOR URINE DRUG SCREEN Drug Class                     Cutoff (ng/mL) Amphetamine and metabolites    1000 Barbiturate and metabolites    200 Benzodiazepine                 200 Opiates and metabolites        300 Cocaine and metabolites        300 THC                            50 Performed at [facility], [address], [place], [place] [address]    WBC 03/12/2023 7.4  4.0 - 10.5 K/uL Final   RBC 03/12/2023 3.82 (L)  4.22 - 5.81 MIL/uL Final   Hemoglobin 03/12/2023 10.8 (L)  13.0 - 17.0 g/dL Final   HCT 91/47/8295 32.7 (L)  39.0 - 52.0 % Final   MCV 03/12/2023 85.6  80.0 - 100.0 fL Final   MCH 03/12/2023 28.3  26.0 - 34.0 pg Final   MCHC 03/12/2023 33.0  30.0 - 36.0 g/dL Final   RDW 62/13/0865 13.6  11.5 - 15.5 % Final   Platelets 03/12/2023 353  150 - 400 K/uL Final   nRBC 03/12/2023 0.0  0.0 - 0.2 % Final   Neutrophils Relative % 03/12/2023 66  % Final   Neutro Abs 03/12/2023 4.9  1.7 - 7.7 K/uL Final   Lymphocytes Relative 03/12/2023 19  % Final   Lymphs Abs 03/12/2023 1.4  0.7 - 4.0 K/uL Final   Monocytes Relative 03/12/2023 7  % Final   Monocytes Absolute 03/12/2023 0.5  0.1 - 1.0 K/uL Final   Eosinophils Relative 03/12/2023 7  % Final   Eosinophils Absolute 03/12/2023 0.6 (H)  0.0 - 0.5 K/uL Final   Basophils Relative 03/12/2023 1  % Final   Basophils Absolute 03/12/2023 0.1  0.0 - 0.1 K/uL Final   Immature Granulocytes 03/12/2023 0  % Final   Abs Immature Granulocytes 03/12/2023 0.01  0.00 - 0.07 K/uL Final   Performed at [facility], [address], [place], [place] [address]   Salicylate Lvl 03/12/2023 <7.0 (L)  7.0 - 30.0 mg/dL Final   Performed at [facility],  [address] [place], [place] [address]   Acetaminophen (Tylenol), Serum 03/12/2023 <10 (L)  10 - 30 ug/mL Final   Comment: (NOTE) Therapeutic concentrations vary significantly. A range of 10-30 ug/mL  may be an effective concentration for many patients. However, some  are best treated at concentrations outside of this range. Acetaminophen concentrations >150 ug/mL at 4 hours after ingestion  and >50 ug/mL at 12 hours after ingestion are often associated with  toxic reactions.  Performed at [facility], [address] [place], [place] [address]    Lactic Acid, Venous 03/12/2023 1.5  0.5 - 1.9 mmol/L Final   Performed at [facility], [address] [place], [place] [address]   Color, Urine 03/12/2023 YELLOW  YELLOW Final   APPearance 03/12/2023 HAZY (A)  CLEAR Final   Specific Gravity, Urine 03/12/2023 1.027  1.005 - 1.030 Final   pH 03/12/2023 5.0  5.0 - 8.0 Final   Glucose, UA 03/12/2023 NEGATIVE  NEGATIVE mg/dL Final   Hgb urine dipstick 03/12/2023 MODERATE (A)  NEGATIVE Final   Bilirubin Urine 03/12/2023 SMALL (A)  NEGATIVE Final   Ketones, ur 03/12/2023 5 (A)  NEGATIVE mg/dL Final   Protein, ur 91/47/8295 30 (A)  NEGATIVE mg/dL Final   Nitrite 62/13/0865 NEGATIVE  NEGATIVE Final   Leukocytes,Ua 03/12/2023 SMALL (A)  NEGATIVE Final   RBC / HPF 03/12/2023 >50  0 - 5 RBC/hpf Final   WBC, UA 03/12/2023 21-50  0 - 5 WBC/hpf Final   Bacteria, UA 03/12/2023 MANY (A)  NONE SEEN Final   Squamous Epithelial / HPF 03/12/2023 0-5  0 - 5 /HPF Final   Mucus 03/12/2023 PRESENT   Final   Ca Oxalate Crys, UA 03/12/2023 PRESENT   Final   Performed at [facility], [address], [place], [place] [address]  Admission on 03/11/2023, Discharged on 03/12/2023  Component Date Value Ref Range Status   Lipase 03/11/2023 23  11 - 51 U/L Final   Performed at [facility], [address], [place], [place] [address]   Sodium 03/11/2023 139  135 - 145 mmol/L Final    Potassium 03/11/2023 3.0 (L)  3.5 - 5.1 mmol/L Final   Chloride 03/11/2023 108  98 - 111 mmol/L Final   CO2 03/11/2023 22  22 - 32 mmol/L Final   Glucose, Bld 03/11/2023 116 (H)  70 - 99 mg/dL Final   Glucose reference range applies only to samples taken after fasting for at least 8 hours.   BUN 03/11/2023 13  6 - 20 mg/dL Final   Creatinine, Ser 03/11/2023 0.63  0.61 - 1.24 mg/dL Final   Calcium 84/13/2440 8.5 (L)  8.9 - 10.3 mg/dL Final   Total Protein 09/30/2535 6.5  6.5 - 8.1 g/dL Final   Albumin 64/40/3474 3.7  3.5 - 5.0 g/dL Final   AST 25/95/6387 63 (H)  15 - 41 U/L Final   ALT 03/11/2023 41  0 - 44 U/L Final   Alkaline Phosphatase 03/11/2023 115  38 - 126 U/L Final   Total Bilirubin 03/11/2023 0.6  0.3 - 1.2 mg/dL Final   GFR, Estimated 03/11/2023 >60  >60 mL/min Final   Comment: (NOTE) Calculated using the CKD-EPI Creatinine Equation (2021)    Anion gap 03/11/2023 9  5 - 15 Final   Performed at [facility], [address], [place], [place] [address]  WBC 03/11/2023 10.6 (H)  4.0 - 10.5 K/uL Final   RBC 03/11/2023 4.22  4.22 - 5.81 MIL/uL Final   Hemoglobin 03/11/2023 11.6 (L)  13.0 - 17.0 g/dL Final   HCT 16/09/9603 36.5 (L)  39.0 - 52.0 % Final   MCV 03/11/2023 86.5  80.0 - 100.0 fL Final   MCH 03/11/2023 27.5  26.0 - 34.0 pg Final   MCHC 03/11/2023 31.8  30.0 - 36.0 g/dL Final   RDW 54/08/8118 13.4  11.5 - 15.5 % Final   Platelets 03/11/2023 351  150 - 400 K/uL Final   nRBC 03/11/2023 0.0  0.0 - 0.2 % Final   Performed at [facility], [address], [place], [place] [address]   Acetaminophen (Tylenol), Serum 03/11/2023 <10 (L)  10 - 30 ug/mL Final   Comment: (NOTE) Therapeutic concentrations vary significantly. A range of 10-30 ug/mL  may be an effective concentration for many patients. However, some  are best treated at concentrations outside of this range. Acetaminophen concentrations >150 ug/mL at 4 hours after ingestion   and >50 ug/mL at 12 hours after ingestion are often associated with  toxic reactions.  Performed at [facility], [address], [place], [place] [address]    Salicylate Lvl 03/11/2023 <7.0 (L)  7.0 - 30.0 mg/dL Final   Performed at [facility], [address], [place], [place] [address]   Alcohol, Ethyl (B) 03/11/2023 <10  <10 mg/dL Final   Comment: (NOTE) Lowest detectable limit for serum alcohol is 10 mg/dL.  For medical purposes only. Performed at [facility], [address] [place], [place] [address]    Lactic Acid, Venous 03/11/2023 1.0  0.5 - 1.9 mmol/L Final   Performed at [facility], [address] [place], [place] [address]   Specimen Description 03/11/2023    Final                   Value:BLOOD BLOOD RIGHT ARM Performed at [facility], [address] [place], [place] [address]    Special Requests 03/11/2023    Final                   Value:BOTTLES DRAWN AEROBIC AND ANAEROBIC Blood Culture adequate volume Performed at [facility], [address], [place], [place] [address]    Culture 03/11/2023    Final                   Value:NO GROWTH 5 DAYS Performed at [facility], [address] [place], [place] [address]    Report Status 03/11/2023 03/17/2023 FINAL   Final  Admission on 01/28/2023, Discharged on 02/06/2023  Component Date Value Ref Range Status   Sodium 01/28/2023 132 (L)  135 - 145 mmol/L Final   Potassium 01/28/2023 3.4 (L)  3.5 - 5.1 mmol/L Final   Chloride 01/28/2023 100  98 - 111 mmol/L Final   CO2 01/28/2023 22  22 - 32 mmol/L Final   Glucose, Bld 01/28/2023 84  70 - 99 mg/dL Final   Glucose reference range applies only to samples taken after fasting for at least 8 hours.   BUN 01/28/2023 11  6 - 20 mg/dL Final   Creatinine, Ser 01/28/2023 0.56 (L)  0.61 - 1.24 mg/dL Final   Calcium 66/44/0347 8.9  8.9 - 10.3 mg/dL Final    Total Protein 01/28/2023 7.0  6.5 - 8.1 g/dL Final  Albumin 01/28/2023 4.0  3.5 - 5.0 g/dL Final   AST 82/95/6213 41  15 - 41 U/L Final   ALT 01/28/2023 38  0 - 44 U/L Final   Alkaline Phosphatase 01/28/2023 110  38 - 126 U/L Final   Total Bilirubin 01/28/2023 1.3 (H)  0.3 - 1.2 mg/dL Final   GFR, Estimated 01/28/2023 >60  >60 mL/min Final   Comment: (NOTE) Calculated using the CKD-EPI Creatinine Equation (2021)    Anion gap 01/28/2023 10  5 - 15 Final   Performed at [facility], [address] [place], [place] [address]   WBC 01/28/2023 10.1  4.0 - 10.5 K/uL Final   RBC 01/28/2023 4.50  4.22 - 5.81 MIL/uL Final   Hemoglobin 01/28/2023 12.9 (L)  13.0 - 17.0 g/dL Final   HCT 84/69/6295 39.5  39.0 - 52.0 % Final   MCV 01/28/2023 87.8  80.0 - 100.0 fL Final   MCH 01/28/2023 28.7  26.0 - 34.0 pg Final   MCHC 01/28/2023 32.7  30.0 - 36.0 g/dL Final   RDW 28/41/3244 11.9  11.5 - 15.5 % Final   Platelets 01/28/2023 429 (H)  150 - 400 K/uL Final   nRBC 01/28/2023 0.0  0.0 - 0.2 % Final   Neutrophils Relative % 01/28/2023 49  % Final   Neutro Abs 01/28/2023 5.0  1.7 - 7.7 K/uL Final   Lymphocytes Relative 01/28/2023 31  % Final   Lymphs Abs 01/28/2023 3.1  0.7 - 4.0 K/uL Final   Monocytes Relative 01/28/2023 13  % Final   Monocytes Absolute 01/28/2023 1.3 (H)  0.1 - 1.0 K/uL Final   Eosinophils Relative 01/28/2023 6  % Final   Eosinophils Absolute 01/28/2023 0.6 (H)  0.0 - 0.5 K/uL Final   Basophils Relative 01/28/2023 1  % Final   Basophils Absolute 01/28/2023 0.1  0.0 - 0.1 K/uL Final   Immature Granulocytes 01/28/2023 0  % Final   Abs Immature Granulocytes 01/28/2023 0.04  0.00 - 0.07 K/uL Final   Performed at [facility], [address] [place], [place] [address]   Specimen Description 01/28/2023    Final                   Value:BLOOD LEFT HAND Performed at [facility], [address], [place], [place] [address]    Special  Requests 01/28/2023    Final                   Value:BOTTLES DRAWN AEROBIC AND ANAEROBIC Blood Culture results may not be optimal due to an excessive volume of blood received in culture bottles Performed at [facility], [address] [place], [place] [address]    Culture 01/28/2023    Final                   Value:NO GROWTH 5 DAYS Performed at [facility], [address], [place], [place] [address]    Report Status 01/28/2023 02/02/2023 FINAL   Final   Specimen Description 01/28/2023    Final                   Value:BLOOD RIGHT FOREARM Performed at [facility], [address] [address] [place], [place] [address]    Special Requests 01/28/2023    Final                   Value:BOTTLES DRAWN AEROBIC AND ANAEROBIC  Blood Culture adequate volume Performed at [facility], [address], [place], [place] [address]    Culture 01/28/2023    Final                   Value:NO GROWTH 5 DAYS Performed at [facility], [address], [place], [place] [address]    Report Status 01/28/2023 02/02/2023 FINAL   Final   Lactic Acid, Venous 01/28/2023 1.4  0.5 - 1.9 mmol/L Final   Performed at [facility], [address] [place], [place] [address]   Opiates 01/28/2023 NONE DETECTED  NONE DETECTED Final   Cocaine 01/28/2023 NONE DETECTED  NONE DETECTED Final   Benzodiazepines 01/28/2023 NONE DETECTED  NONE DETECTED Final   Amphetamines 01/28/2023 POSITIVE (A)  NONE DETECTED Final   Tetrahydrocannabinol 01/28/2023 POSITIVE (A)  NONE DETECTED Final   Barbiturates 01/28/2023 NONE DETECTED  NONE DETECTED Final   Comment: (NOTE) DRUG SCREEN FOR MEDICAL PURPOSES ONLY.  IF CONFIRMATION IS NEEDED FOR ANY PURPOSE, NOTIFY LAB WITHIN 5 DAYS.  LOWEST DETECTABLE LIMITS FOR URINE DRUG SCREEN Drug Class                     Cutoff (ng/mL) Amphetamine and metabolites    1000 Barbiturate and metabolites    200 Benzodiazepine                  200 Opiates and metabolites        300 Cocaine and metabolites        300 THC                            50 Performed at [facility], [address], [place], [place] [address]    Color, Urine 01/28/2023 YELLOW  YELLOW Final   APPearance 01/28/2023 CLEAR  CLEAR Final   Specific Gravity, Urine 01/28/2023 1.019  1.005 - 1.030 Final   pH 01/28/2023 6.0  5.0 - 8.0 Final   Glucose, UA 01/28/2023 NEGATIVE  NEGATIVE mg/dL Final   Hgb urine dipstick 01/28/2023 NEGATIVE  NEGATIVE Final   Bilirubin Urine 01/28/2023 NEGATIVE  NEGATIVE Final   Ketones, ur 01/28/2023 20 (A)  NEGATIVE mg/dL Final   Protein, ur 64/33/2951 30 (A)  NEGATIVE mg/dL Final   Nitrite 88/41/6606 NEGATIVE  NEGATIVE Final   Leukocytes,Ua 01/28/2023 NEGATIVE  NEGATIVE Final   RBC / HPF 01/28/2023 21-50  0 - 5 RBC/hpf Final   WBC, UA 01/28/2023 6-10  0 - 5 WBC/hpf Final   Bacteria, UA 01/28/2023 NONE SEEN  NONE SEEN Final   Squamous Epithelial / HPF 01/28/2023 0-5  0 - 5 /HPF Final   Mucus 01/28/2023 PRESENT   Final   Ca Oxalate Crys, UA 01/28/2023 PRESENT   Final   Performed at [facility], [address], [place], [place] [address]   WBC 01/29/2023 5.8  4.0 - 10.5 K/uL Final   RBC 01/29/2023 3.62 (L)  4.22 - 5.81 MIL/uL Final   Hemoglobin 01/29/2023 10.9 (L)  13.0 - 17.0 g/dL Final   HCT 10/93/2355 33.2 (L)  39.0 - 52.0 % Final   MCV 01/29/2023 91.7  80.0 - 100.0 fL Final   MCH 01/29/2023 30.1  26.0 - 34.0 pg Final   MCHC 01/29/2023 32.8  30.0 - 36.0 g/dL Final   RDW 73/22/0254 12.3  11.5 - 15.5 % Final   Platelets 01/29/2023 290  150 - 400 K/uL Final   nRBC 01/29/2023 0.0  0.0 - 0.2 % Final   Performed at [facility], [address] [place], [place] [address]   Sodium 01/29/2023 135  135 - 145 mmol/L Final   Potassium 01/29/2023 3.3 (L)  3.5 - 5.1 mmol/L Final   Chloride 01/29/2023 108  98 - 111 mmol/L Final   CO2 01/29/2023 20 (L)  22 - 32 mmol/L Final    Glucose, Bld 01/29/2023 147 (H)  70 - 99 mg/dL Final   Glucose reference range applies only to samples taken after fasting for at least 8 hours.   BUN 01/29/2023 8  6 - 20 mg/dL Final   Creatinine, Ser 01/29/2023 0.54 (L)  0.61 - 1.24 mg/dL Final   Calcium 60/45/4098 8.1 (L)  8.9 - 10.3 mg/dL Final   GFR, Estimated 01/29/2023 >60  >60 mL/min Final   Comment: (NOTE) Calculated using the CKD-EPI Creatinine Equation (2021)    Anion gap 01/29/2023 7  5 - 15 Final   Performed at [facility], [address] [place], [place] [address]   Magnesium 01/29/2023 2.1  1.7 - 2.4 mg/dL Final   Performed at [facility], [address], [place], [place] [address]   Phosphorus 01/29/2023 2.9  2.5 - 4.6 mg/dL Final   Performed at [facility], [address], [place], [place] [address]   Potassium 01/28/2023 3.2 (L)  3.5 - 5.1 mmol/L Final   Performed at [facility], [address], [place], [place] [address]   Magnesium 01/28/2023 1.8  1.7 - 2.4 mg/dL Final   Performed at [facility], [address], [place], [place] [address]   Sodium 01/30/2023 138  135 - 145 mmol/L Final   Potassium 01/30/2023 4.5  3.5 - 5.1 mmol/L Final   Chloride 01/30/2023 110  98 - 111 mmol/L Final   CO2 01/30/2023 20 (L)  22 - 32 mmol/L Final   Glucose, Bld 01/30/2023 119 (H)  70 - 99 mg/dL Final   Glucose reference range applies only to samples taken after fasting for at least 8 hours.   BUN 01/30/2023 7  6 - 20 mg/dL Final   Creatinine, Ser 01/30/2023 0.60 (L)  0.61 - 1.24 mg/dL Final   Calcium 95/28/4132 8.0 (L)  8.9 - 10.3 mg/dL Final   GFR, Estimated 01/30/2023 >60  >60 mL/min Final   Comment: (NOTE) Calculated using the CKD-EPI Creatinine Equation (2021)    Anion gap 01/30/2023 8  5 - 15 Final   Performed at [facility], [address] [place], [place] [address]   Magnesium 01/30/2023 1.5 (L)  1.7 -  2.4 mg/dL Final   Performed at [facility], [address], [place], [place] [address]   WBC 01/30/2023 4.1  4.0 - 10.5 K/uL Final   RBC 01/30/2023 3.74 (L)  4.22 - 5.81 MIL/uL Final   Hemoglobin 01/30/2023 11.0 (L)  13.0 - 17.0 g/dL Final   HCT 66/44/0347 33.5 (L)  39.0 - 52.0 % Final   MCV 01/30/2023 89.6  80.0 - 100.0 fL Final   MCH 01/30/2023 29.4  26.0 - 34.0 pg Final   MCHC 01/30/2023 32.8  30.0 - 36.0 g/dL Final   RDW 42/59/5638 12.4  11.5 - 15.5 % Final   Platelets 01/30/2023 254  150 - 400 K/uL Final   nRBC 01/30/2023 0.0  0.0 - 0.2 % Final   Performed  at [facility], [address] [place], [place] [address]   Glucose-Capillary 01/30/2023 116 (H)  70 - 99 mg/dL Final   Glucose reference range applies only to samples taken after fasting for at least 8 hours.   Comment 1 01/30/2023 Notify RN   Final   Comment 2 01/30/2023 Document in Chart   Final   WBC 01/31/2023 5.0  4.0 - 10.5 K/uL Final   RBC 01/31/2023 4.23  4.22 - 5.81 MIL/uL Final   Hemoglobin 01/31/2023 12.2 (L)  13.0 - 17.0 g/dL Final   HCT 60/45/4098 38.1 (L)  39.0 - 52.0 % Final   MCV 01/31/2023 90.1  80.0 - 100.0 fL Final   MCH 01/31/2023 28.8  26.0 - 34.0 pg Final   MCHC 01/31/2023 32.0  30.0 - 36.0 g/dL Final   RDW 11/91/4782 12.3  11.5 - 15.5 % Final   Platelets 01/31/2023 310  150 - 400 K/uL Final   nRBC 01/31/2023 0.0  0.0 - 0.2 % Final   Neutrophils Relative % 01/31/2023 48  % Final   Neutro Abs 01/31/2023 2.4  1.7 - 7.7 K/uL Final   Lymphocytes Relative 01/31/2023 33  % Final   Lymphs Abs 01/31/2023 1.6  0.7 - 4.0 K/uL Final   Monocytes Relative 01/31/2023 11  % Final   Monocytes Absolute 01/31/2023 0.6  0.1 - 1.0 K/uL Final   Eosinophils Relative 01/31/2023 7  % Final   Eosinophils Absolute 01/31/2023 0.3  0.0 - 0.5 K/uL Final   Basophils Relative 01/31/2023 1  % Final   Basophils Absolute 01/31/2023 0.1  0.0 - 0.1 K/uL Final   Immature Granulocytes 01/31/2023 0   % Final   Abs Immature Granulocytes 01/31/2023 0.02  0.00 - 0.07 K/uL Final   Performed at [facility], [address], [place], [place] [address]   Sodium 01/31/2023 136  135 - 145 mmol/L Final   Potassium 01/31/2023 3.6  3.5 - 5.1 mmol/L Final   Chloride 01/31/2023 102  98 - 111 mmol/L Final   CO2 01/31/2023 26  22 - 32 mmol/L Final   Glucose, Bld 01/31/2023 117 (H)  70 - 99 mg/dL Final   Glucose reference range applies only to samples taken after fasting for at least 8 hours.   BUN 01/31/2023 10  6 - 20 mg/dL Final   Creatinine, Ser 01/31/2023 0.55 (L)  0.61 - 1.24 mg/dL Final   Calcium 30/86/5784 8.4 (L)  8.9 - 10.3 mg/dL Final   GFR, Estimated 01/31/2023 >60  >60 mL/min Final   Comment: (NOTE) Calculated using the CKD-EPI Creatinine Equation (2021)    Anion gap 01/31/2023 8  5 - 15 Final   Performed at [facility], [address], [place], [place] [address]   Magnesium 01/31/2023 1.8  1.7 - 2.4 mg/dL Final   Performed at [facility], [address] [place], [place] [address]   Phosphorus 01/31/2023 4.0  2.5 - 4.6 mg/dL Final   Performed at [facility], [address], [place], [place] [address]   WBC 02/04/2023 6.3  4.0 - 10.5 K/uL Final   RBC 02/04/2023 4.86  4.22 - 5.81 MIL/uL Final   Hemoglobin 02/04/2023 14.1  13.0 - 17.0 g/dL Final   HCT 09/30/2535 43.8  39.0 - 52.0 % Final   MCV 02/04/2023 90.1  80.0 - 100.0 fL Final   MCH 02/04/2023 29.0  26.0 - 34.0 pg Final   MCHC 02/04/2023 32.2  30.0 - 36.0 g/dL Final   RDW 16/09/9603 12.3  11.5 - 15.5 % Final   Platelets 02/04/2023 272  150 - 400 K/uL Final   nRBC 02/04/2023 0.0  0.0 - 0.2 % Final   Neutrophils Relative % 02/04/2023 35  % Final   Neutro Abs 02/04/2023 2.2  1.7 - 7.7 K/uL Final   Lymphocytes Relative 02/04/2023 41  % Final   Lymphs Abs 02/04/2023 2.6  0.7 - 4.0 K/uL Final   Monocytes Relative 02/04/2023 13  % Final   Monocytes  Absolute 02/04/2023 0.8  0.1 - 1.0 K/uL Final   Eosinophils Relative 02/04/2023 9  % Final   Eosinophils Absolute 02/04/2023 0.6 (H)  0.0 - 0.5 K/uL Final   Basophils Relative 02/04/2023 1  % Final   Basophils Absolute 02/04/2023 0.1  0.0 - 0.1 K/uL Final   Immature Granulocytes 02/04/2023 1  % Final   Abs Immature Granulocytes 02/04/2023 0.03  0.00 - 0.07 K/uL Final   Performed at [facility], [address] [place], [place] [address]   Sodium 02/04/2023 134 (L)  135 - 145 mmol/L Final   Potassium 02/04/2023 4.2  3.5 - 5.1 mmol/L Final   Chloride 02/04/2023 100  98 - 111 mmol/L Final   CO2 02/04/2023 25  22 - 32 mmol/L Final   Glucose, Bld 02/04/2023 111 (H)  70 - 99 mg/dL Final   Glucose reference range applies only to samples taken after fasting for at least 8 hours.   BUN 02/04/2023 18  6 - 20 mg/dL Final   Creatinine, Ser 02/04/2023 0.57 (L)  0.61 - 1.24 mg/dL Final   Calcium 11/91/4782 9.0  8.9 - 10.3 mg/dL Final   GFR, Estimated 02/04/2023 >60  >60 mL/min Final   Comment: (NOTE) Calculated using the CKD-EPI Creatinine Equation (2021)    Anion gap 02/04/2023 9  5 - 15 Final   Performed at [facility], [address], [place], [place] [address]   Magnesium 02/04/2023 1.9  1.7 - 2.4 mg/dL Final   Performed at [facility], [address], [place], [place] [address]   TSH 02/04/2023 4.836 (H)  0.350 - 4.500 uIU/mL Final   Comment: Performed by a 3rd Generation assay with a functional sensitivity of <=0.01 uIU/mL. Performed at [facility], [address] [place], [place] [address]   Admission on 01/10/2023, Discharged on 01/11/2023  Component Date Value Ref Range Status   Glucose-Capillary 01/10/2023 123 (H)  70 - 99 mg/dL Final   Glucose reference range applies only to samples taken after fasting for at least 8 hours.   Sodium 01/10/2023 137  135 - 145 mmol/L Final   Potassium 01/10/2023 4.7  3.5 -  5.1 mmol/L Final   HEMOLYSIS AT THIS LEVEL MAY AFFECT RESULT   Chloride 01/10/2023 103  98 - 111 mmol/L Final   CO2 01/10/2023 24  22 - 32 mmol/L Final   Glucose, Bld 01/10/2023 105 (H)  70 - 99 mg/dL Final   Glucose reference range applies only to samples taken after fasting for at least 8 hours.   BUN 01/10/2023 8  6 - 20 mg/dL Final   Creatinine, Ser 01/10/2023 0.65  0.61 - 1.24 mg/dL Final   Calcium 62/95/2841 8.9  8.9 - 10.3 mg/dL Final   Total Protein 32/44/0102 6.9  6.5 - 8.1 g/dL Final   Albumin 72/53/6644 3.7  3.5 - 5.0 g/dL Final   AST 03/47/4259 82 (H)  15 - 41 U/L Final   HEMOLYSIS AT THIS LEVEL MAY AFFECT RESULT   ALT 01/10/2023 72 (H)  0 - 44 U/L Final   HEMOLYSIS AT THIS LEVEL MAY AFFECT RESULT   Alkaline Phosphatase 01/10/2023 158 (H)  38 - 126 U/L Final   Total Bilirubin 01/10/2023 1.1  0.3 - 1.2 mg/dL Final   HEMOLYSIS AT THIS LEVEL MAY AFFECT RESULT   GFR, Estimated 01/10/2023 >60  >60 mL/min Final   Comment: (NOTE) Calculated using the CKD-EPI Creatinine Equation (2021)    Anion gap 01/10/2023 10  5 - 15 Final   Performed at [facility], [address] [place], [place] [address]   Salicylate Lvl 01/10/2023 <7.0 (L)  7.0 - 30.0 mg/dL Final   Performed at [facility], [address], [place], [place] [address]   Acetaminophen (Tylenol), Serum 01/10/2023 <10 (L)  10 - 30 ug/mL Final   Comment: (NOTE) Therapeutic concentrations vary significantly. A range of 10-30 ug/mL  may be an effective concentration for many patients. However, some  are best treated at concentrations outside of this range. Acetaminophen concentrations >150 ug/mL at 4 hours after ingestion  and >50 ug/mL at 12 hours after ingestion are often associated with  toxic reactions.  Performed at [facility], [address], [place], [place] [address]    Alcohol, Ethyl (B) 01/10/2023 <10  <10 mg/dL Final   Comment: (NOTE) Lowest detectable limit for serum alcohol is 10  mg/dL.  For medical purposes only. Performed at [facility], [address], [place], [place] [address]    WBC 01/10/2023 13.4 (H)  4.0 - 10.5 K/uL Final   RBC 01/10/2023 4.61  4.22 - 5.81 MIL/uL Final   Hemoglobin 01/10/2023 13.9  13.0 - 17.0 g/dL Final   HCT 65/78/4696 41.7  39.0 - 52.0 % Final   MCV 01/10/2023 90.5  80.0 - 100.0 fL Final   MCH 01/10/2023 30.2  26.0 - 34.0 pg Final   MCHC 01/10/2023 33.3  30.0 - 36.0 g/dL Final   RDW 29/52/8413 11.9  11.5 - 15.5 % Final   Platelets 01/10/2023 290  150 - 400 K/uL Final   nRBC 01/10/2023 0.0  0.0 - 0.2 % Final   Neutrophils Relative % 01/10/2023 79  % Final   Neutro Abs 01/10/2023 10.7 (H)  1.7 - 7.7 K/uL Final   Lymphocytes Relative 01/10/2023 12  % Final   Lymphs Abs 01/10/2023 1.6  0.7 - 4.0 K/uL Final   Monocytes Relative 01/10/2023 6  % Final   Monocytes Absolute 01/10/2023 0.7  0.1 - 1.0 K/uL Final   Eosinophils Relative 01/10/2023 2  % Final   Eosinophils Absolute 01/10/2023 0.2  0.0 - 0.5 K/uL Final   Basophils Relative 01/10/2023 1  % Final   Basophils Absolute 01/10/2023 0.1  0.0 - 0.1 K/uL Final   Immature Granulocytes 01/10/2023 0  % Final   Abs Immature Granulocytes 01/10/2023 0.04  0.00 - 0.07 K/uL Final   Performed at [facility], [address], [place], [place] [address]  Admission on 12/12/2022, Discharged on 12/12/2022  Component Date Value Ref Range Status   Glucose-Capillary 12/12/2022 45 (L)  70 - 99 mg/dL Final   Glucose reference range applies only to samples taken after fasting for at least 8 hours.   Glucose-Capillary 12/12/2022 134 (H)  70 - 99 mg/dL Final   Glucose reference range applies only to samples taken after fasting for  at least 8 hours.    Blood Alcohol level:  Lab Results  Component Value Date   ETH <10 03/12/2023   ETH <10 03/11/2023    Metabolic Disorder Labs: No results found for: "HGBA1C", "MPG" No results found for: "PROLACTIN" Lab Results  Component Value Date    TRIG 189 (H) 11/18/2021    Therapeutic Lab Levels: No results found for: "LITHIUM" No results found for: "VALPROATE" No results found for: "CBMZ"  Physical Findings   CAGE-AID    Flowsheet Row ED to Hosp-Admission (Discharged) from 09/27/2021 in [facility]  CAGE-AID Score 3      PHQ2-9    Flowsheet Row ED from 03/15/2023 in [facility]  PHQ-2 Total Score 4  PHQ-9 Total Score 16      Flowsheet Row ED from 03/15/2023 in [facility] ED from 03/13/2023 in [facility] Emergency Department at [facility] ED from 03/12/2023 in [facility] Emergency Department at [facility]  C-SSRS RISK CATEGORY No Risk No Risk No Risk        Musculoskeletal  Strength & Muscle Tone: within normal limits Gait & Station: normal Patient leans: N/A  Psychiatric Specialty Exam  Presentation  General Appearance:  Appropriate for Environment  Eye Contact: Poor (in bed laying with his mask covering his eyes)  Speech: Clear and Coherent  Speech Volume: Normal  Handedness:No data recorded  Mood and Affect  Mood: Irritable  Affect: Restricted   Thought Process  Thought Processes: Linear  Descriptions of Associations:Intact  Orientation:Full (Time, Place and Person)  Thought Content:Rumination     Hallucinations:Hallucinations: None  Ideas of Reference:None  Suicidal Thoughts:Suicidal Thoughts: No  Homicidal Thoughts:Homicidal Thoughts: No   Sensorium  Memory: Immediate Fair; Recent Fair  Judgment: Poor  Insight: None   Executive Functions  Concentration: Fair  Attention Span: Fair  Recall: [employer] of Knowledge: Fair  Language: Fair   Psychomotor Activity  Psychomotor Activity: Psychomotor Activity: Decreased   Assets  Assets: Resilience   Sleep  Sleep: Sleep: Fair   No data recorded  Physical Exam  Physical  Exam HENT:     Head: Normocephalic and atraumatic.  Pulmonary:     Effort: Pulmonary effort is normal.  Neurological:     General: No focal deficit present.     Mental Status: He is alert.    Review of Systems  Musculoskeletal:  Positive for myalgias.  Psychiatric/Behavioral:  Negative for hallucinations and suicidal ideas.    Blood pressure 115/64, pulse 73, temperature 98.5 F (36.9 C), temperature source Oral, resp. rate 18, SpO2 100 %. There is no height or weight on file to calculate BMI.  Treatment Plan Summary: Daily contact with patient to assess and evaluate symptoms and progress in treatment and Medication management   Patient continues to display characteristics of an unreliable historian. He appears fixated on clonidine. While on the unit with others he appears well, but attempts to endorse symptoms of withdrawal in 1 on 1 conversations. His vitals have stabilized. There is some concern patient is med-seeking. When patient is denied clonidine conversation becomes more limited than it already was. He does continue to be irritable.   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

## 2023-03-19 DIAGNOSIS — F1520 Other stimulant dependence, uncomplicated: Secondary | ICD-10-CM | POA: Diagnosis not present

## 2023-03-19 MED ORDER — GABAPENTIN 300 MG PO CAPS: 300.0000 mg | ORAL_CAPSULE | Freq: Three times a day (TID) | ORAL | 0 refills | Status: AC

## 2023-03-19 MED ORDER — QUETIAPINE FUMARATE ER 50 MG PO TB24: 50.0000 mg | ORAL_TABLET | Freq: Every day | ORAL | 0 refills | Status: DC

## 2023-03-19 MED ORDER — NICOTINE 21 MG/24HR TD PT24: 21.0000 mg | MEDICATED_PATCH | Freq: Every day | TRANSDERMAL | 0 refills | Status: DC

## 2023-03-19 MED ORDER — HYDROXYZINE HCL 50 MG PO TABS: 50.0000 mg | ORAL_TABLET | Freq: Four times a day (QID) | ORAL | 0 refills | Status: DC | PRN

## 2023-03-19 MED ORDER — QUETIAPINE FUMARATE ER 200 MG PO TB24: 200.0000 mg | ORAL_TABLET | Freq: Every day | ORAL | 0 refills | Status: DC

## 2023-03-19 NOTE — ED Notes (Signed)
Patient is sleeping. Respirations equal and unlabored, skin warm and dry. No change in assessment or acuity. Routine safety checks conducted according to facility protocol. Will continue to monitor for safety.   

## 2023-03-19 NOTE — ED Notes (Signed)
Patient awake and alert on unit.  He ate breakfast and is without distress or complaint.  Patient is calm and cooperative with care.  He may be for discharge later today to [facility].  Waiting for clearance.  No evidence of withdrawal will monitor and provide a safe environment.

## 2023-03-19 NOTE — ED Provider Notes (Signed)
FBC/OBS ASAP Discharge Summary  Date and Time: 03/19/2023 8:19 AM  Name: Jason Buchanan  MRN:  [id number]   Discharge Diagnoses:  Final diagnoses:  Methamphetamine use disorder, severe, dependence    Subjective: Jason Buchanan is a 37 y.o., male patient presented to [facility] as a transfer from [facility], voluntarily, for admission to facility based crisis center to continue detox of heroin. He has a hx of substance use d/o and self- reported Bipolar d/o and PTSD. Of note, patient's UDS is positive for methamphetamines and THC only.   Stay Summary: The patient was evaluated each day by a clinical provider to ascertain response to treatment. Improvement was noted by the patient's report of decreasing symptoms, improved sleep and appetite, affect, medication tolerance, behavior, and participation in unit programming.  Patient was asked each day to complete a self inventory noting mood, mental status, pain, new symptoms, anxiety and concerns.   Patient responded well to medication and being in a therapeutic and supportive environment. Positive and appropriate behavior was noted and the patient was motivated for recovery. The patient worked closely with the treatment team and case manager to develop a discharge plan with appropriate goals. Coping skills, problem solving as well as relaxation therapies were also part of the unit programming.   By the day of discharge patient was in much improved condition than upon admission.  Symptoms were reported as significantly decreased or resolved completely. The patient denied SI/HI and voiced no AVH. The patient was motivated to continue taking medication with a goal of continued improvement in mental health.    Total Time spent with patient: 20 minutes  Past Psychiatric History: Polysubstance Abuse, IV drug use, Hx of Suboxone treatment, not currently prescribed.  Past Medical History: Denies contributory medical history.  Family History: unknown Family  Psychiatric  History: unknown Social History: unknown Tobacco Cessation:  A prescription for an FDA-approved tobacco cessation medication provided at discharge  Current Medications:  Current Facility-Administered Medications  Medication Dose Route Frequency Provider Last Rate Last Admin   alum & mag hydroxide-simeth (MAALOX/MYLANTA) 200-200-20 MG/5ML suspension 30 mL  30 mL Oral Q4H PRN Bing Neighbors, NP       dicyclomine (BENTYL) tablet 20 mg  20 mg Oral Q6H PRN Bing Neighbors, NP       gabapentin (NEURONTIN) capsule 300 mg  300 mg Oral TID Bing Neighbors, NP   300 mg at 03/18/23 2114   hydrOXYzine (ATARAX) tablet 50 mg  50 mg Oral Q6H PRN Bing Neighbors, NP   50 mg at 03/18/23 1422   loperamide (IMODIUM) capsule 2-4 mg  2-4 mg Oral PRN Bing Neighbors, NP       magnesium hydroxide (MILK OF MAGNESIA) suspension 30 mL  30 mL Oral Daily PRN Bing Neighbors, NP       methocarbamol (ROBAXIN) tablet 500 mg  500 mg Oral Q8H PRN Bing Neighbors, NP   500 mg at 03/18/23 2006   naproxen (NAPROSYN) tablet 500 mg  500 mg Oral BID PRN Bing Neighbors, NP   500 mg at 03/18/23 2004   nicotine (NICODERM CQ - dosed in mg/24 hours) patch 21 mg  21 mg Transdermal Daily Bing Neighbors, NP   21 mg at 03/18/23 0949   ondansetron (ZOFRAN-ODT) disintegrating tablet 4 mg  4 mg Oral Q6H PRN Bing Neighbors, NP       QUEtiapine (SEROQUEL XR) 24 hr tablet 250 mg  250 mg  Oral QHS Eliseo Gum B, MD   250 mg at 03/18/23 2113   traZODone (DESYREL) tablet 50 mg  50 mg Oral QHS Bing Neighbors, NP   50 mg at 03/18/23 2114   Current Outpatient Medications  Medication Sig Dispense Refill   albuterol (VENTOLIN HFA) 108 (90 Base) MCG/ACT inhaler Inhale 2 puffs into the lungs every 6 (six) hours as needed for wheezing or shortness of breath.     buprenorphine-naloxone (SUBOXONE) 8-2 mg SUBL SL tablet Place 1 tablet under the tongue 2 (two) times daily for 7 days 14 tablet 0    cyclobenzaprine (FLEXERIL) 10 MG tablet Take 10 mg by mouth 3 (three) times daily as needed for muscle spasms.     fluticasone (FLONASE ALLERGY RELIEF) 50 MCG/ACT nasal spray Place 1 spray into both nostrils in the morning and at bedtime.     folic acid (FOLVITE) 1 MG tablet Take 1 tablet (1 mg total) by mouth daily. 90 tablet 0   gabapentin (NEURONTIN) 300 MG capsule Take 1 capsule (300 mg total) by mouth 3 (three) times daily. 90 capsule 0   hydrOXYzine (ATARAX) 10 MG tablet Take 1 tablet (10 mg total) by mouth every 6 (six) hours as needed for anxiety. (Patient taking differently: Take 10 mg by mouth 3 (three) times daily.) 30 tablet 0   ibuprofen (ADVIL) 200 MG tablet Take 600-800 mg by mouth every 6 (six) hours as needed for mild pain or headache.     naloxone (NARCAN) nasal spray 4 mg/0.1 mL Take as needed for Narcotic overdose. (Patient not taking: Reported on 03/14/2023) 1 each 0   naproxen sodium (ALEVE) 220 MG tablet Take 220-440 mg by mouth 2 (two) times daily as needed (for pain or headaches).     OPCON-A 0.027-0.315 % SOLN Place 1 drop into both eyes 3 (three) times daily as needed (for itching).     pantoprazole (PROTONIX) 40 MG tablet Take 1 tablet (40 mg total) by mouth daily. (Patient taking differently: Take 40 mg by mouth daily before breakfast.) 30 tablet 0   polyethylene glycol powder (MIRALAX) 17 GM/SCOOP powder Take 17 g by mouth daily as needed (constipation). (Patient taking differently: Take 17 g by mouth daily as needed for mild constipation.) 255 g 0   QUEtiapine (SEROQUEL XR) 300 MG 24 hr tablet Take 1 tablet (300 mg total) by mouth at bedtime. 30 tablet 0   senna (SENOKOT) 8.6 MG TABS tablet Take 2 tablets (17.2 mg total) by mouth at bedtime. (Patient not taking: Reported on 03/14/2023) 120 tablet 0   thiamine (VITAMIN B-1) 100 MG tablet Take 1 tablet (100 mg total) by mouth daily 100 tablet 3   ZYRTEC ALLERGY 10 MG tablet Take 10 mg by mouth at bedtime.      PTA  Medications:  Facility Ordered Medications  Medication   alum & mag hydroxide-simeth (MAALOX/MYLANTA) 200-200-20 MG/5ML suspension 30 mL   magnesium hydroxide (MILK OF MAGNESIA) suspension 30 mL   dicyclomine (BENTYL) tablet 20 mg   loperamide (IMODIUM) capsule 2-4 mg   methocarbamol (ROBAXIN) tablet 500 mg   naproxen (NAPROSYN) tablet 500 mg   ondansetron (ZOFRAN-ODT) disintegrating tablet 4 mg   nicotine (NICODERM CQ - dosed in mg/24 hours) patch 21 mg   traZODone (DESYREL) tablet 50 mg   hydrOXYzine (ATARAX) tablet 50 mg   gabapentin (NEURONTIN) capsule 300 mg   QUEtiapine (SEROQUEL XR) 24 hr tablet 250 mg   PTA Medications  Medication Sig   naloxone (  NARCAN) nasal spray 4 mg/0.1 mL Take as needed for Narcotic overdose. (Patient not taking: Reported on 03/14/2023)   senna (SENOKOT) 8.6 MG TABS tablet Take 2 tablets (17.2 mg total) by mouth at bedtime. (Patient not taking: Reported on 03/14/2023)   polyethylene glycol powder (MIRALAX) 17 GM/SCOOP powder Take 17 g by mouth daily as needed (constipation). (Patient taking differently: Take 17 g by mouth daily as needed for mild constipation.)   thiamine (VITAMIN B-1) 100 MG tablet Take 1 tablet (100 mg total) by mouth daily   folic acid (FOLVITE) 1 MG tablet Take 1 tablet (1 mg total) by mouth daily.   hydrOXYzine (ATARAX) 10 MG tablet Take 1 tablet (10 mg total) by mouth every 6 (six) hours as needed for anxiety. (Patient taking differently: Take 10 mg by mouth 3 (three) times daily.)   buprenorphine-naloxone (SUBOXONE) 8-2 mg SUBL SL tablet Place 1 tablet under the tongue 2 (two) times daily for 7 days   albuterol (VENTOLIN HFA) 108 (90 Base) MCG/ACT inhaler Inhale 2 puffs into the lungs every 6 (six) hours as needed for wheezing or shortness of breath.   ibuprofen (ADVIL) 200 MG tablet Take 600-800 mg by mouth every 6 (six) hours as needed for mild pain or headache.   naproxen sodium (ALEVE) 220 MG tablet Take 220-440 mg by mouth 2 (two)  times daily as needed (for pain or headaches).   ZYRTEC ALLERGY 10 MG tablet Take 10 mg by mouth at bedtime.   cyclobenzaprine (FLEXERIL) 10 MG tablet Take 10 mg by mouth 3 (three) times daily as needed for muscle spasms.   fluticasone (FLONASE ALLERGY RELIEF) 50 MCG/ACT nasal spray Place 1 spray into both nostrils in the morning and at bedtime.   OPCON-A 0.027-0.315 % SOLN Place 1 drop into both eyes 3 (three) times daily as needed (for itching).       03/18/2023   12:34 PM 03/17/2023    3:34 PM 03/15/2023    5:24 PM  Depression screen PHQ 2/9  Decreased Interest Down, Depressed, Hopeless PHQ - 2 Score Altered sleeping Tired, decreased energy Change in appetite Feeling bad or failure about yourself  Trouble concentrating Moving slowly or fidgety/restless 2 2   Suicidal thoughts 1 0 1  PHQ-9 Score Difficult doing work/chores   Very difficult    Flowsheet Row ED from 03/15/2023 in [facility] ED from 03/13/2023 in [facility] Emergency Department at [facility] ED from 03[facility]12/2023 in [facility] Emergency Department at [facility]  C-SSRS RISK CATEGORY No Risk No Risk No Risk       Musculoskeletal  Strength & Muscle Tone: within normal limits Gait & Station: normal Patient leans: N/A  Psychiatric Specialty Exam  Presentation  General Appearance:  Appropriate for Environment  Eye Contact: Poor (in bed laying with his mask covering his eyes)  Speech: Clear and Coherent  Speech Volume: Normal   Mood and Affect  Mood: Irritable  Affect: Restricted   Thought Process  Thought Processes: Linear  Descriptions of Associations:Intact  Orientation:Full (Time, Place and Person)  Thought Content:Rumination     Hallucinations:Hallucinations: None  Ideas of Reference:None  Suicidal Thoughts:Suicidal Thoughts: No  Homicidal Thoughts:Homicidal Thoughts:  No   Sensorium  Memory: Immediate Fair; Recent Fair  Judgment:  Poor  Insight: None   Art therapist  Concentration: Fair  Attention Span: Fair  Recall: [employer] of Knowledge: Fair  Language: Fair   Psychomotor Activity  Psychomotor Activity: Psychomotor Activity: Decreased   Assets  Assets: Resilience   Sleep  Sleep: Sleep: Fair    Physical Exam  Physical Exam Constitutional:      Appearance: Normal appearance.  HENT:     Head: Normocephalic and atraumatic.  Pulmonary:     Effort: Pulmonary effort is normal.  Neurological:     Mental Status: He is alert and oriented to person, place, and time.  ROS Blood pressure 99/61, pulse 75, temperature 98.2 F (36.8 C), temperature source Oral, resp. rate 16, SpO2 100 %. There is no height or weight on file to calculate BMI.  Demographic Factors:  Male, Caucasian, and Low socioeconomic status  Loss Factors: Financial problems/change in socioeconomic status  Historical Factors: NA  Risk Reduction Factors:   Positive social support  Continued Clinical Symptoms:  Bipolar Disorder:   Depressive phase Alcohol/Substance Abuse/Dependencies  Cognitive Features That Contribute To Risk:  None    Suicide Risk:  Mild: There are no identifiable plans, no associated intent, mild dysphoria and related symptoms, good self-control (both objective and subjective assessment), few other risk factors, and identifiable protective factors, including available and accessible social support.  Plan Of Care/Follow-up recommendations:  Follow-up recommendations:  Activity:  Normal, as tolerated Diet:  Per PCP recommendation  Patient is instructed prior to discharge to: Take all medications as prescribed by his mental healthcare provider. Report any adverse effects and/or reactions from the medicines to his outpatient provider promptly. Patient has been instructed & cautioned: To not engage in alcohol and or  illegal drug use while on prescription medicines.  In the event of worsening symptoms, patient is instructed to call the crisis hotline at 988, 911 and or go to the nearest ED for appropriate evaluation and treatment of symptoms. To follow-up with his primary care provider for your other medical issues, concerns and or health care needs.   Disposition: [facility], MD 03/19/2023, 8:19 AM

## 2023-03-25 ENCOUNTER — Emergency Department (HOSPITAL_COMMUNITY)
Admission: EM | Admit: 2023-03-25 | Discharge: 2023-03-26 | Disposition: A | Discharge status: Home / Self Care | Payer: Medicaid Other | Attending: Emergency Medicine | Admitting: Emergency Medicine

## 2023-03-25 ENCOUNTER — Emergency Department (HOSPITAL_COMMUNITY): Payer: Medicaid Other

## 2023-03-25 DIAGNOSIS — R7309 Other abnormal glucose: Secondary | ICD-10-CM | POA: Insufficient documentation

## 2023-03-25 DIAGNOSIS — F1510 Other stimulant abuse, uncomplicated: Secondary | ICD-10-CM | POA: Diagnosis not present

## 2023-03-25 DIAGNOSIS — R000 Tachycardia, unspecified: Secondary | ICD-10-CM | POA: Insufficient documentation

## 2023-03-25 DIAGNOSIS — F1910 Other psychoactive substance abuse, uncomplicated: Secondary | ICD-10-CM | POA: Diagnosis not present

## 2023-03-25 DIAGNOSIS — R441 Visual hallucinations: Secondary | ICD-10-CM | POA: Diagnosis present

## 2023-03-25 LAB — COMPREHENSIVE METABOLIC PANEL
ALT: 37 U/L (ref 0–44)
AST: 38 U/L (ref 15–41)
Albumin: 4.1 g/dL (ref 3.5–5.0)
Alkaline Phosphatase: 120 U/L (ref 38–126)
Anion gap: 12 (ref 5–15)
BUN: 12 mg/dL (ref 6–20)
CO2: 21 mmol/L — ABNORMAL LOW (ref 22–32)
Calcium: 8.9 mg/dL (ref 8.9–10.3)
Chloride: 104 mmol/L (ref 98–111)
Creatinine, Ser: 0.59 mg/dL — ABNORMAL LOW (ref 0.61–1.24)
GFR, Estimated: >60 mL/min (ref >60)
Glucose, Bld: 108 mg/dL — ABNORMAL HIGH (ref 70–99)
Potassium: 3.9 mmol/L (ref 3.5–5.1)
Sodium: 137 mmol/L (ref 135–145)
Total Bilirubin: 0.5 mg/dL (ref 0.3–1.2)
Total Protein: 6.9 g/dL (ref 6.5–8.1)

## 2023-03-25 LAB — CBC
HCT: 40.1 % (ref 39.0–52.0)
Hemoglobin: 12.5 g/dL — ABNORMAL LOW (ref 13.0–17.0)
MCH: 27.2 pg (ref 26.0–34.0)
MCHC: 31.2 g/dL (ref 30.0–36.0)
MCV: 87.4 fL (ref 80.0–100.0)
Platelets: 335 10*3/uL (ref 150–400)
RBC: 4.59 MIL/uL (ref 4.22–5.81)
RDW: 13.9 % (ref 11.5–15.5)
WBC: 9.8 10*3/uL (ref 4.0–10.5)
nRBC: 0 % (ref 0.0–0.2)

## 2023-03-25 LAB — ETHANOL: Alcohol, Ethyl (B): <10 mg/dL (ref <10)

## 2023-03-25 NOTE — ED Notes (Signed)
Patient remains somnolent but startles when sternal rubbed. Patient falls back asleep almost immediately but maintains airway and O2 saturation at 96% and higher

## 2023-03-25 NOTE — ED Notes (Addendum)
Pt was changed into purple scrubs and stuff was went through. Moved into a bed and moved into the hallway. Pts belongings were put in locker #1 two bags

## 2023-03-25 NOTE — ED Provider Triage Note (Signed)
Emergency Medicine Provider Triage Evaluation Note  Jason Buchanan , a 37 y.o. male  was evaluated in triage.  Pt brought in by EMS after he was given an unknown medication by his roommate.  EMS reports patient having active hallucinations with paranoia.  Patient does have history significant for polysubstance abuse.  Unable to obtain accurate ROS from patient due to altered mental status, slurred speech, somnolence.     Review of Systems  Positive: As above Negative: As above  Physical Exam  BP 111/74   Pulse (!) 118   Resp 15   SpO2 98%  Gen:   Somnolent, will wake and answer questions, speech is slurred and mumbled Resp:  Normal effort  MSK:   Moves extremities without difficulty  Other:  Heart rate is tachycardic   Medical Decision Making  Medically screening exam initiated at 9:10 PM.  Appropriate orders placed.  Jason Buchanan was informed that the remainder of the evaluation will be completed by another provider, this initial triage assessment does not replace that evaluation, and the importance of remaining in the ED until their evaluation is complete.     Lenard Simmer, [place] 03/25/23 2112

## 2023-03-25 NOTE — ED Triage Notes (Signed)
Patient BIB [facility] from home after calling b/c his roommate gave him a pill that he didn't know what was. EMS reports patient having active hallucinations en route with paranoia. Patient currently sedated and sleeping but remains tachycardic. Unknown what the medication was or if the person giving it was real. Patient referred to himself in the third person throughout transport. O2 100% room air, HR 112

## 2023-03-26 ENCOUNTER — Encounter (HOSPITAL_COMMUNITY): Payer: Self-pay | Admitting: Behavioral Health

## 2023-03-26 ENCOUNTER — Ambulatory Visit (HOSPITAL_COMMUNITY)
Admission: EM | Admit: 2023-03-26 | Discharge: 2023-03-26 | Disposition: A | Discharge status: Home / Self Care | Payer: No Payment, Other | Attending: Behavioral Health | Admitting: Behavioral Health

## 2023-03-26 DIAGNOSIS — F1911 Other psychoactive substance abuse, in remission: Secondary | ICD-10-CM | POA: Diagnosis not present

## 2023-03-26 DIAGNOSIS — F319 Bipolar disorder, unspecified: Secondary | ICD-10-CM | POA: Diagnosis not present

## 2023-03-26 DIAGNOSIS — Z5900 Homelessness unspecified: Secondary | ICD-10-CM | POA: Insufficient documentation

## 2023-03-26 DIAGNOSIS — F1190 Opioid use, unspecified, uncomplicated: Secondary | ICD-10-CM | POA: Insufficient documentation

## 2023-03-26 DIAGNOSIS — F1910 Other psychoactive substance abuse, uncomplicated: Secondary | ICD-10-CM

## 2023-03-26 LAB — RAPID URINE DRUG SCREEN, HOSP PERFORMED
Amphetamines: POSITIVE — AB
Barbiturates: NOT DETECTED
Benzodiazepines: NOT DETECTED
Cocaine: NOT DETECTED
Opiates: NOT DETECTED
Tetrahydrocannabinol: POSITIVE — AB

## 2023-03-26 LAB — I-STAT VENOUS BLOOD GAS, ED
Acid-base deficit: 1 mmol/L (ref 0.0–2.0)
Bicarbonate: 25.8 mmol/L (ref 20.0–28.0)
Calcium, Ion: 1.22 mmol/L (ref 1.15–1.40)
HCT: 35 % — ABNORMAL LOW (ref 39.0–52.0)
Hemoglobin: 11.9 g/dL — ABNORMAL LOW (ref 13.0–17.0)
O2 Saturation: 77 %
Potassium: 3.7 mmol/L (ref 3.5–5.1)
Sodium: 139 mmol/L (ref 135–145)
TCO2: 27 mmol/L (ref 22–32)
pCO2, Ven: 51.3 mmHg (ref 44–60)
pH, Ven: 7.31 (ref 7.25–7.43)
pO2, Ven: 46 mmHg — ABNORMAL HIGH (ref 32–45)

## 2023-03-26 LAB — ACETAMINOPHEN LEVEL
Acetaminophen (Tylenol), Serum: <10 ug/mL — ABNORMAL LOW (ref 10–30)
Acetaminophen (Tylenol), Serum: <10 ug/mL — ABNORMAL LOW (ref 10–30)

## 2023-03-26 LAB — SALICYLATE LEVEL: Salicylate Lvl: <7 mg/dL — ABNORMAL LOW (ref 7.0–30.0)

## 2023-03-26 LAB — CBG MONITORING, ED: Glucose-Capillary: 101 mg/dL — ABNORMAL HIGH (ref 70–99)

## 2023-03-26 MED ORDER — NALOXONE HCL 4 MG/0.1ML NA LIQD
1.0000 | Freq: Once | NASAL | Status: AC
  Administered 2023-03-26: 1 via NASAL
  Filled 2023-03-26: qty 4

## 2023-03-26 NOTE — ED Provider Notes (Signed)
Jason Buchanan EMERGENCY DEPARTMENT AT [facility] Provider Note   CSN: [id number] Arrival date & time: 03/25/23  2029     History  Chief Complaint  Patient presents with   Hallucinations   Medical Clearance    Jason Buchanan is a 37 y.o. male.  HPI  Patient is a 37 year old male with past medical history significant for chronic appearing left basal ganglia lacunar infarct most recently demonstrated/7/24, polysubstance abuse methamphetamine and opioid use and marijuana, self-reports bipolar and PTSD  Patient presents emergency room today brought in by EMS for altered mental status.  He states that he took a pill that he has roommate gave him.  He states he does not know what the pill was.  He does not think that it is Tylenol, a salicylate or Benadryl  Per EMS report patient was paranoid and endorsing visual hallucinations and round.  Was tachycardic initially CBG 101  I have attempted to obtain collateral from mother however there was no answer I left a voice message.      Home Medications Prior to Admission medications   Medication Sig Start Date End Date Taking? Authorizing Provider  albuterol (VENTOLIN HFA) 108 (90 Base) MCG/ACT inhaler Inhale 2 puffs into the lungs every 6 (six) hours as needed for wheezing or shortness of breath.    [provider]  fluticasone (FLONASE ALLERGY RELIEF) 50 MCG/ACT nasal spray Place 1 spray into both nostrils in the morning and at bedtime.    [provider]  folic acid (FOLVITE) 1 MG tablet Take 1 tablet (1 mg total) by mouth daily. 02/06/23 05/07/23  Darlin Drop, DO  gabapentin (NEURONTIN) 300 MG capsule Take 1 capsule (300 mg total) by mouth 3 (three) times daily. 03/19/23 04/18/23  Lamar Sprinkles, MD  hydrOXYzine (ATARAX) 50 MG tablet Take 1 tablet (50 mg total) by mouth every 6 (six) hours as needed for anxiety. 03/19/23   Lamar Sprinkles, MD  naloxone [facility]) nasal spray 4 mg/0.1 mL Take as needed for Narcotic  overdose. Patient not taking: Reported on 03/14/2023 12/12/22   Glyn Ade, MD  nicotine (NICODERM CQ - DOSED IN MG/24 HOURS) 21 mg/24hr patch Place 1 patch (21 mg total) onto the skin daily. 03/19/23   Lamar Sprinkles, MD  OPCON-A 0.027-0.315 % SOLN Place 1 drop into both eyes 3 (three) times daily as needed (for itching).    [provider]  QUEtiapine (SEROQUEL XR) 200 MG 24 hr tablet Take 1 tablet (200 mg total) by mouth at bedtime. Take 250 mg total by mouth nightly at bedtime (200 mg tablet and 50 mg tablet) 03/19/23 04/18/23  Lamar Sprinkles, MD  QUEtiapine (SEROQUEL XR) 50 MG TB24 24 hr tablet Take 1 tablet (50 mg total) by mouth at bedtime. Take 250 mg total by mouth nightly at bedtime (200 mg tablet and 50 mg tablet) 03/19/23 04/18/23  Lamar Sprinkles, MD  thiamine (VITAMIN B-1) 100 MG tablet Take 1 tablet (100 mg total) by mouth daily 02/06/23 05/17/23  Darlin Drop, DO  ZYRTEC ALLERGY 10 MG tablet Take 10 mg by mouth at bedtime.    [provider]      Allergies    Bee venom, Acetaminophen, Succinylcholine, and Chlorhexidine    Review of Systems   Review of Systems  Physical Exam Updated Vital Signs BP (!) 111/52 (BP Location: Right Arm)   Pulse 89   Temp 97.6 F (36.4 C) (Oral)   Resp 14   SpO2 100%  Physical  Exam Vitals and nursing note reviewed.  Constitutional:      General: He is not in acute distress.    Comments: Somnolent 37 year old male awakens to sternal rub and will answer questions and then fall back asleep.  Is oriented x 3  HENT:     Head: Normocephalic and atraumatic.     Comments: Head atraumatic no lacerations or abrasions no C-spine tenderness    Nose: Nose normal.     Mouth/Throat:     Mouth: Mucous membranes are moist.  Eyes:     General: No scleral icterus. Cardiovascular:     Rate and Rhythm: Normal rate and regular rhythm.     Pulses: Normal pulses.     Heart sounds: Normal heart sounds.  Pulmonary:     Effort: Pulmonary  effort is normal. No respiratory distress.     Breath sounds: No wheezing.  Abdominal:     Palpations: Abdomen is soft.     Tenderness: There is no abdominal tenderness.  Musculoskeletal:     Cervical back: Normal range of motion.     Right lower leg: No edema.     Left lower leg: No edema.  Skin:    General: Skin is warm and dry.     Capillary Refill: Capillary refill takes less than 2 seconds.     Comments: Old well-healed scars to right upper extremity crossing the [facility].  No rashes or abrasions  Neurological:     Mental Status: He is alert.  Psychiatric:        Mood and Affect: Mood normal.        Behavior: Behavior normal.     ED Results / Procedures / Treatments   Labs (all labs ordered are listed, but only abnormal results are displayed) Labs Reviewed  COMPREHENSIVE METABOLIC PANEL - Abnormal; Notable for the following components:      Result Value   CO2 21 (*)    Glucose, Bld 108 (*)    Creatinine, Ser 0.59 (*)    All other components within normal limits  RAPID URINE DRUG SCREEN, HOSP PERFORMED - Abnormal; Notable for the following components:   Amphetamines POSITIVE (*)    Tetrahydrocannabinol POSITIVE (*)    All other components within normal limits  CBC - Abnormal; Notable for the following components:   Hemoglobin 12.5 (*)    All other components within normal limits  ACETAMINOPHEN LEVEL - Abnormal; Notable for the following components:   Acetaminophen (Tylenol), Serum <10 (*)    All other components within normal limits  SALICYLATE LEVEL - Abnormal; Notable for the following components:   Salicylate Lvl <7.0 (*)    All other components within normal limits  ACETAMINOPHEN LEVEL - Abnormal; Notable for the following components:   Acetaminophen (Tylenol), Serum <10 (*)    All other components within normal limits  CBG MONITORING, ED - Abnormal; Notable for the following components:   Glucose-Capillary 101 (*)    All other components within normal limits   I-STAT VENOUS BLOOD GAS, ED - Abnormal; Notable for the following components:   pO2, Ven 46 (*)    HCT 35.0 (*)    Hemoglobin 11.9 (*)    All other components within normal limits  ETHANOL    EKG None  Radiology CT Head Wo Contrast  Result Date: 03/25/2023 CLINICAL DATA:  Mental status change, unknown cause EXAM: CT HEAD WITHOUT CONTRAST TECHNIQUE: Contiguous axial images were obtained from the base of the skull through the vertex without intravenous contrast.  RADIATION DOSE REDUCTION: This exam was performed according to the departmental dose-optimization program which includes automated exposure control, adjustment of the mA and/or kV according to patient size and/or use of iterative reconstruction technique. COMPARISON:  Head CT 2 weeks ago 03/11/2023 FINDINGS: Brain: No intracranial hemorrhage, mass effect, or midline shift. Small lacunar infarct in the left basal ganglia is stable. No hydrocephalus. The basilar cisterns are patent. No evidence of territorial infarct or acute ischemia. No extra-axial or intracranial fluid collection. Vascular: No hyperdense vessel or unexpected calcification. Skull: No fracture or focal lesion. Sinuses/Orbits: Paranasal sinuses and mastoid air cells are clear. The visualized orbits are unremarkable. Other: None. IMPRESSION: 1. No acute intracranial abnormality. 2. Unchanged remote left basal gangliar lacunar infarct. Electronically Signed   By: Narda Rutherford M.D.   On: 03/25/2023 22:02    Procedures Procedures    Medications Ordered in ED Medications  naloxone (NARCAN) nasal spray 4 mg/0.1 mL (1 spray Nasal Provided for home use 03/26/23 0150)    ED Course/ Medical Decision Making/ A&P                             Medical Decision Making Amount and/or Complexity of Data Reviewed Labs: ordered.   This patient presents to the ED for concern of AMS, this involves a number of treatment options, and is a complaint that carries with it a high risk  of complications and morbidity. A differential diagnosis was considered for the patient's symptoms which is discussed below:   The differential diagnosis for AMS is extensive and includes, but is not limited to: drug overdose - opioids, alcohol, sedatives, antipsychotics, drug withdrawal, others; Metabolic: hypoxia, hypoglycemia, hyperglycemia, hypercalcemia, hypernatremia, hyponatremia, uremia, hepatic encephalopathy, hypothyroidism, hyperthyroidism, vitamin B12 or thiamine deficiency, carbon monoxide poisoning, Wilson's disease, Lactic acidosis, DKA/HHOS; Infectious: meningitis, encephalitis, bacteremia/sepsis, urinary tract infection, pneumonia, neurosyphilis; Structural: Space-occupying lesion, (brain tumor, subdural hematoma, hydrocephalus,); Vascular: stroke, subarachnoid hemorrhage, coronary ischemia, hypertensive encephalopathy, CNS vasculitis, thrombotic thrombocytopenic purpura, disseminated intravascular coagulation, hyperviscosity; Psychiatric: Schizophrenia, depression; Other: Seizure, hypothermia, heat stroke, dementia   Given that patient has indicated that he took a pill of unknown identity certainly intoxication is on my differential.  Because I am uncertain whether there is Tylenol exposure will obtain initial and 4-hour Tylenol level.    Co morbidities: Discussed in HPI   Brief History:  Patient is a 37 year old male with past medical history significant for chronic appearing left basal ganglia lacunar infarct most recently demonstrated/7/24, polysubstance abuse methamphetamine and opioid use and marijuana, self-reports bipolar and PTSD  Patient presents emergency room today brought in by EMS for altered mental status.  He states that he took a pill that he has roommate gave him.  He states he does not know what the pill was.  He does not think that it is Tylenol, a salicylate or Benadryl  Per EMS report patient was paranoid and endorsing visual hallucinations and round.  Was  tachycardic initially CBG 101  I have attempted to obtain collateral from mother however there was no answer I left a voice message.     EMR reviewed including pt PMHx, past surgical history and past visits to ER.   See HPI for more details   Lab Tests:   I ordered and independently interpreted labs. Labs notable for Tylenol salicylate undetectable, 4-hour Tylenol undetectable, UDS positive for amphetamines and marijuana, CMP unremarkable   CBC without clinically significant anemia or leukocytosis.  Ethanol undetectable  i-STAT VBG normal no evidence of acidosis.   Imaging Studies:  Abnormal findings. I personally reviewed all imaging studies. Imaging notable for  Chronic lacunar infarct on CT imaging this was last seen earlier this month.  Cardiac Monitoring:  The patient was maintained on a cardiac monitor.  I personally viewed and interpreted the cardiac monitored which showed an underlying rhythm of: NSR EKG non-ischemic normal intervals, QRS interval normal   Medicines ordered:  I ordered medication including Narcan for somnolence Reevaluation of the patient after these medicines showed that the patient stayed the same I have reviewed the patients home medicines and have made adjustments as needed   Critical Interventions:     Consults/Attending Physician      Reevaluation:  After the interventions noted above I re-evaluated patient and found that they have :improved   Social Determinants of Health:  The patient's social determinants of health were a factor in the care of this patient    Problem List / ED Course:  Patient arrived via EMS for altered mental status after ingesting a pill of unknown identity.  He is quite somnolent initially but has improved over the 10 hours that he has been in the emergency department.  He has been monitored here.  Did not respond to Narcan but did not have any hypoxic episodes no hypotension vital signs remained  normal he was maintained on monitor throughout his stay with NSR on monitor.  EKG with normal intervals Tylenol level and salicylate level undetectable UDS positive for amphetamines and marijuana only.  He is requesting detox on my reevaluation and I have provided him resources for this.   Dispostion:  After consideration of the diagnostic results and the patients response to treatment, I feel that the patent would benefit from discharge with outpatient follow-up   Final Clinical Impression(s) / ED Diagnoses Final diagnoses:  Polysubstance abuse    Rx / DC Orders ED Discharge Orders     None         Gailen Shelter, [place] 03/26/23 1610    Sabas Sous, MD 03/26/23 [phone]

## 2023-03-26 NOTE — ED Provider Notes (Cosign Needed Addendum)
Behavioral Health Urgent Care Medical Screening Exam  Patient Name: Jason Buchanan MRN: [id number] Date of Evaluation: 03/26/23 Chief Complaint:  "I'm just trying to get off drugs again" Diagnosis:  Final diagnoses:  Polysubstance abuse   History of Present Illness: Jason Buchanan is a 37 y.o. male patient with a past psychiatric history of polysubstance abuse, acute psychosis, accidental overdose, unspecified bipolar disorder and reported schizophrenia who presented to [facility] voluntarily via [facility] [facility] requesting substance use treatment.  Patient assessed face-to-face by this provider and chart reviewed on 03/26/23. Sydell Axon, [facility] present during assessment. On evaluation, Jason Buchanan is asleep on couch in assessment area in no acute distress. Patient awoken by provider and became irritable stating he has not slept in a few days and asks several times if he can just sleep now. Patient is alert and oriented x4. Speech is mumbled, normal rate and volume. Patient appears disheveled. Eye contact is poor. Mood is irritable with flat and congruent affect. Thought process is coherent with logical thought content. Patient denies suicidal and homicidal ideations and easily contracts verbally for safety with this Clinical research associate. Patient denies a history of suicide attempts or self-harm. Patient denies past psychiatric hospitalizations. Per chart review, patient has presented to [facility] and [facility] multiple times this year for accidental overdose, psychosis, polysubstance abuse. Patient most recently presented to [facility] 03/25/23 with AMS, paranoia, and visual hallucinations after taking a pill his roommate gave him. Patient was discharged from [facility] this morning with resources for outpatient follow-up. Patient was also recently at [facility] (03/15/23 - 03/19/23) for methamphetamine use disorder and discharged to [facility]. Patient denies current auditory and visual hallucinations but reports he heard voices  this morning calling his name and "calling me a bitch." Patient reports symptoms of paranoia "like people trying to get me." Patient is able to converse coherently with goal-directed thoughts and no distractibility or preoccupation. Objectively, there is no evidence of psychosis/mania, delusional thinking, or indication that patient is responding to internal or external stimuli.  Patient endorses poor sleep stating he hasn't slept in several days and fair appetite. Patient states he is currently homeless and unemployed. Patient denies access to weapons. Patient states he is currently prescribed Suboxone and Seroquel by "Ms. [name] at the [facility]." Patient denies alcohol use and endorses use of heroin and fentanyl. Patient reports last use of heroin and fentanyl 1 week ago and states he believes it had methamphetamines in it as well. Patient reports he typically uses 2 grams daily by "snorting." Patient states he was at [facility] [place] and "took a pill" from his roommate that he believes was Oxycodone. Patient states, "that's a bad program, people are getting high right in front of me." Patient states [facility] would not allow him to have his Suboxone or his Seroquel. Patient states he is unable to return to [facility] [place] and reports that they found Suboxone on his person and that he owes them $300 - $400. Patient states he left [facility] 3 - 4 days ago and slept behind a building last night. Patient states he is interested in getting into the [facility] or finding another residential substance use treatment program.   Patient offered support and encouragement. Discussed following-up with outpatient psychiatric resources provided in AVS for therapy and medication management as well as substance use treatment programs. Discussed following-up with list provided for [facility] in [place]. Bus pass provided. Patient irritable  with plan for discharge. Security  escorted patient out of facility.  At this time, Jason Buchanan is educated and verbalizes understanding of mental health resources and other crisis services in the community. He is instructed to call 911 and present to the nearest emergency room should he experience any suicidal/homicidal ideation, auditory/visual/hallucinations, or detrimental worsening of his mental health condition.    Flowsheet Row ED from 03/26/2023 in [facility] ED from 03/15/2023 in [facility] ED from 03/13/2023 in [facility] Emergency Department at [facility]  C-SSRS RISK CATEGORY No Risk No Risk No Risk       Psychiatric Specialty Exam  Presentation  General Appearance:Disheveled; Appropriate for Environment  Eye Contact:Poor (Patient states he is tired and asks several times if he can just sleep)  Speech:Other (comment) (Mumbled)  Speech Volume:Normal  Handedness:Right   Mood and Affect  Mood: Irritable  Affect: Congruent; Flat   Thought Process  Thought Processes: Goal Directed; Coherent  Descriptions of Associations:Intact  Orientation:Full (Time, Place and Person)  Thought Content:Logical    Hallucinations:None (Patient reports he heard voices calling his name and "calling me a bitch" this morning, denies current AVH.)  Ideas of Reference:None  Suicidal Thoughts:No Without Intent; Without Plan; Without Means to Carry Out; Without Access to Means  Homicidal Thoughts:No   Sensorium  Memory: Immediate Fair; Recent Fair; Remote Fair  Judgment: Fair  Insight: Fair   Art therapist  Concentration: Fair  Attention Span: Fair  Recall: [employer] of Knowledge: Fair  Language: Fair   Psychomotor Activity  Psychomotor Activity: Normal   Assets  Assets: Desire for Improvement; Resilience; Physical Health; Financial Resources/Insurance   Sleep  Sleep: Poor  Number of hours:  0  (Patient states he hasn't sleep in several days)   Physical Exam: Physical Exam Vitals and nursing note reviewed.  Constitutional:      General: He is not in acute distress.    Appearance: He is normal weight. He is not ill-appearing.  HENT:     Head: Normocephalic and atraumatic.     Nose: Nose normal.  Eyes:     General:        Right eye: No discharge.        Left eye: No discharge.     Conjunctiva/sclera: Conjunctivae normal.  Cardiovascular:     Rate and Rhythm: Tachycardia present.  Pulmonary:     Effort: Pulmonary effort is normal. No respiratory distress.  Musculoskeletal:        General: Normal range of motion.     Cervical back: Normal range of motion.  Skin:    General: Skin is warm and dry.  Neurological:     General: No focal deficit present.     Mental Status: He is alert and oriented to person, place, and time. Mental status is at baseline.  Psychiatric:        Attention and Perception: Attention and perception normal.        Mood and Affect: Affect is flat.        Behavior: Behavior normal.        Thought Content: Thought content normal. Thought content is not paranoid or delusional. Thought content does not include homicidal or suicidal ideation. Thought content does not include homicidal or suicidal plan.        Cognition and Memory: Cognition and memory normal.        Judgment: Judgment normal.     Comments: Irritable mood.  Mumbled speech. Patient reports he heard voices calling his name and "calling me a bitch" this morning, denies current AVH.    Review of Systems  Constitutional: Negative.   HENT: Negative.    Eyes: Negative.   Respiratory: Negative.    Cardiovascular: Negative.   Gastrointestinal: Negative.   Genitourinary: Negative.   Musculoskeletal: Negative.   Skin: Negative.   Neurological: Negative.   Endo/Heme/Allergies: Negative.   Psychiatric/Behavioral:  Positive for substance abuse. Negative for hallucinations, memory loss and  suicidal ideas. The patient is not nervous/anxious.        Patient reports he heard voices calling his name and "calling me a bitch" this morning, denies current AVH.    Blood pressure 127/69, pulse (!) 107, temperature 97.9 F (36.6 C), temperature source Oral, resp. rate 18, SpO2 99 %. There is no height or weight on file to calculate BMI.  Musculoskeletal: Strength & Muscle Tone: within normal limits Gait & Station: normal Patient leans: N/A   [facility] MSE Discharge Disposition for Follow up and Recommendations: Based on my evaluation the patient does not appear to have an emergency medical condition and can be discharged with resources and follow up care in outpatient services for Medication Management, Individual Therapy, and Substance Use Treatment   Sunday Corn, NP 03/26/2023, 1:52 PM

## 2023-03-26 NOTE — Discharge Instructions (Addendum)
Based on the information that you have provided and the presenting issues outpatient services and resources for have been recommended.  It is imperative that you follow through with treatment recommendations within 5-7 days from the of discharge to mitigate further risk to your safety and mental well-being. A list of referrals has been provided below to get you started.  You are not limited to the list provided.  In case of an urgent crisis, you may contact the Mobile Crisis Unit with [facility] at [phone].  List of [employer] vacancies   House Name Gender [facility] # [facility] # Interviews Capacity Vacancies Distance Last Updated Phillips Hay [phone] [place] [phone] Sun 8:00pm 7 0 0.7 miles 03/19/2023 3:01PM [employer] [phone] Dina Rich E [phone] Wynelle Link 9:30pm 8 2 0.7 miles 03/20/2023 8:50AM Sharion Dove [phone] [place] [phone] Sun 9:00pm 7 0 0.7 miles 02/26/2023 4:15PM [employer] [phone] Ernestine Mcmurray [phone] Wed 11:00am 8 2 0.8 miles 03/19/2023 3:01PM Rayne Du [phone] [place] Rodn [phone] Daily 6:00pm 6 1 1.3 miles 03/24/2023 12:27PM [employer] [phone] Greer Pickerel [phone] Mon 7:00pm 8 1 1.7 miles 03/20/2023 3:44PM [employer] [phone] [place]ane [phone] Mon 6:00pm 7 0 2.2 miles 03/19/2023 3:04PM [employer] [phone] Dina Rich [phone] Thu 6:30pm 6 0 2.4 miles 03/20/2023 3:00PM Roselyn B[facility] [phone] Arna Snipe [phone] Sun 5:30pm 8 0 2.5 miles 03/13/2023 3:00PM [employer] [phone] Adan Sis [phone] Tue 9:30pm 7 2 2.8 miles 03/13/2023 3:59PM [place] [phone] [place]ha  [phone] Sun 11:00am 8 0 3.1 miles 03/20/2023 7:17PM [facility] [phone] Gerhard Perches [phone] Sun 6:00pm 7 1 3.2 miles 03/19/2023 3:04PM [employer] [phone] Chrisandra Netters [phone] Daily 7:00pm 7 1 3.4 miles 01/18/2023 6:38AM Four Mariane Masters [place] [phone] [place] [phone] Sun 6:00pm 7 0 3.5 miles 03/19/2023 3:02PM Janus Molder [facility] [phone] Daily 6:00pm 7 0 3.6 miles 03/19/2023 3:00PM [employer] [phone] [phone] Mon 6:00pm 7 0 4.1 miles 03/05/2023 3:01PM [address] [place] [phone] Rene Kocher [phone] Sun 5:00pm 6 0 4.1 miles 03/19/2023 3:24PM Lucienne Capers [phone] [place] Cassie [phone] Sun 6:00pm 7 2 4.3 miles 03/19/2023 3:08PM Cyndia Bent [place] [phone] [place] [phone] Sun 7:00pm 7 2 4.7 miles 03/19/2023 3:15PM [employer] [place] [phone] Hazle Quant [phone] Daily 1:00am 6 1 5.1 miles 03/20/2023 3:01PM [employer] [phone] [place] [phone] Sun 6:00pm 7 0 5.3 miles 03/19/2023 3:02PM   Substance Abuse Resources  [facility] - Admissions are currently completed Monday through Friday at 8am; both appointments and walk-ins are accepted.  Any individual that is a [facility] resident may present for a substance abuse screening and assessment for admission.  A person may be referred by numerous sources or self-refer.   Potential clients will be screened for medical necessity and appropriateness for the program.  Clients must meet criteria for high-intensity residential treatment services.  If clinically appropriate, a client will continue with the comprehensive clinical assessment and intake process, as well as enrollment in the [facility].   Address: [address] [place], [place] [address] Admin Hours: Mon-Fri  8AM to 5PM Center Hours: 24/7 Phone: [phone] Fax:  [phone]   [facility] (Detox) Facility Based Crisis:  These are 3 locations for services: Please call before arrival    Address: [address] [place], [place] [address] Phone: [phone]   Address: [address] [place], [place] [address] Phone#: [phone]   Address: [address] [place], [place] [address] Phone#: [phone]     [facility] ([facility]): (offers outpatient therapy and intensive outpatient substance abuse therapy).  [address], [place], [place] [address] Phone: [phone]   [facility]: Offers FREE recovery skills classes, support groups, 1:1 Peer Support, and Compeer Classes. [address] [place], [place] [address] Phone: [phone] (Call to complete intake).  [facility] [address] [place], [place] [address] Phone: [phone] ext: [phone] [facility] provides food, shelter and other programs and services to the homeless men of [place] through our [employer].   By offering safe shelter, three meals a day, clean clothing, Biblical counseling, financial planning, vocational training, GED/education and employment assistance, we've helped mend the shattered lives of many homeless men since opening in 1974.   We have approximately 267 beds available, with a max of 312 beds including mats for emergency situations and currently house an average of 270 men a night.   Prospective Client Check-In Information Photo ID Required (State/ Out of State/ [facility]) - if photo ID is not available, clients are required to have a printout of a police/sheriff's criminal history report. Help out with chores around the Mission. No sex offender of any type (pending, charged, registered and/or any other sex related offenses) will be permitted to check in. Must be willing to abide by all rules, regulations, and policies established by the [employer]. The following will be provided - shelter, food, clothing, and biblical counseling. If you or someone you know is in need of assistance at our [facility] shelter in [place], [place], please call [phone] ext. [phone].   [place] [facility] [facility]-will provide timely access to mental health services for children and adolescents (4-17) and adults presenting in a mental health crisis. The program is designed for those who need urgent Behavioral Health or Substance Use treatment and are not experiencing a medical crisis that would typically require an emergency room visit.    [address] [place], [place] [address] Phone: [phone] [email or web address]   [facility]: Phone#: [phone]   The [facility] Intensive Outpatient Program (SAIOP) means structured individual and group addiction activities and services that are provided at an outpatient program designed to assist adult and adolescent consumers to begin recovery and learn skills for recovery maintenance. [facility] SAIOP program is offered at least 3 hours a day, 3 days a week.SAIOP services shall include a structured program consisting of, but not limited to, the following services: Individual counseling and support; Group counseling and support; Family counseling, training or support; Biochemical assays to identify recent drug use (e.g., urine drug screens); Strategies for relapse prevention to include community and social support systems in treatment; Life skills; Crisis contingency planning; Disease Management; and Treatment support activities that have been adapted or specifically designed for persons with physical disabilities, or persons with co-occurring disorders of mental illness and substance abuse/dependence or mental retardation/developmental disability and substance abuse/dependence. Phone: [phone]    [facility] [address] [place], [place]  [address] Phone: [phone] Admissions team is available 24/7  Phone: [phone]. Fax: [phone]   [facility]     Admissions [address], [place], [place] [address] [phone]    The [facility] 24-Hour Call Center: [phone]  Behavioral Health Crisis Line: [phone]   [facility]: Outpatient psychiatric Services  New Patient Assessment and Therapy Walk-in Monday thru Thursday 8:00 am first come first serve until slots are full Every Friday from 1:00 pm to 4:00 pm first come first serve until slots are full  New Patient Psychiatric Medication Management Monday thru Friday from 8:00 am to 11:00 am first come first served until slots are full  For all walk-ins we ask that you arrive by 7:15 am because patients will be seen in there order of arrival.   Availability is limited, and therefore you may not be seen on the same day that you walk in.  Our goal is to serve and meet the needs of our community to the best of our ability.    Based on what you have shared, a list of resources for outpatient therapy and psychiatry is provided below to get you started back on treatment.  It is imperative that you follow through with treatment within 5-7 days from the day of discharge to prevent any further risk to your safety or mental well-being.  You are not limited to the list provided.  In case of an urgent crisis, you may contact the Mobile Crisis Unit with [facility] at [phone].        Outpatient Services for Therapy and Medication Management for [facility] [address], [place], [address] [phone] phone  New Patient Assessment/Therapy Walk-ins Monday and Wednesday: 8am until slots are full. Every 1st and 2nd Friday: 1pm - 5pm  NO ASSESSMENT/THERAPY WALK-INS ON TUESDAYS OR THURSDAYS  New Patient Psychiatry/Medication Management  Walk-ins Monday-Friday: 8am-11am  For all walk-ins, we ask that you arrive by 7:30am because patient will be seen in the order of arrival.  Availability is limited; therefore, you may not be seen on the same day that you walk-in.  Our goal is to serve and meet the needs of our community to the best of our ability.   [facility] [address] [place], [place], [address] [phone] phone  [facility] [address] [place], [place], [address] [phone] phone [phone] phone ([address], [address], Anthem/Elevance, [address], [address], [address], [address], Healthy [place], [place], [place], [address], [employer]oco[employer], [place], UHC, American Financial, [place], Out of Network)  [employer], [place] [address] [place], [place], [address] [phone] phone ([place], Anthem/Elevance, Sanmina-SCI Options/Carelon, BCBS, [address], House, [place], [place], [place], [employer], [place], [place], [place], [facility])  [employer] [address] [place], [place], [address] [phone] phone (Medicaid, ask about other insurance)  [facility] [address] [place], [place], [address] [phone] phone [phone] fax ([address], [place] , [place], [place], [place] Health Choice, UHC, General Electric, Self-Pay)  [facility] [address] [place], [place], [address] [phone] phone ([address] Anthem/Elevance, [address], [address], [place], [employer], [place], [place], [place], Har[employer] Entertainment, [place], [place], [place], [facility])  [facility] - 6-8 MONTH WAIT FOR THERAPY; SOONER FOR MEDICATION MANAGEMENT [address], [address] [place], [place], [address] [phone] phone ([address], AmeriHealth [place] - [place], [place], [place], [place], Friday Health Plans, [address], Winn[employer]Dixie  Healthy [place], [address], [address], [place], [place],  [place], [place], Tricare, [facility], Safeco Corporation, [place])  [facility] [address], [address] [place], [place], [address] [phone] phone  [facility] [address], [address] [place], [place], [address] [phone] phone  [facility] [address] [place], [place], [address] [phone] phone  [facility] [place] - THERAPY ONLY [address], [place], [address] [phone] phone  [facility], [place] [address], [place], [address] [phone] phone  [facility] [address] [place], [place], [address] [phone] phone [phone] fax  [facility] [address] [place], [place], [address] [phone] phone [phone] fax  [facility] [phone] [address] [place], [place], [address] [phone] phone  Jovita Kussmaul [address] [place], [place], [address]  [phone] phone  [facility]  (Adults Only) [address] [place], [place], [address]  [phone] phone [phone] fax  Discharge Instructions     To help you maintain a sober lifestyle, a substance use disorder treatment program may be beneficial to you.  Contact one of the following providers at your earliest opportunity to ask about enrolling in their program:   RESIDENTIAL TREATMENT:       [facility]       [address]       [place], [place] [address]       [phone]   RESIDENTIAL REHAB PROGRAMS - LONG TERM CHARITABLE:        [address]       [address] [place] [address]       [phone]        [facility]       [phone] [address], [place] [address]       [phone] II       [address] [address]       [place], [place] [address]       [phone], [place] [address]       [phone]   CHEMICAL  DEPENDENCY INTENSIVE OUTPATIENT:       [facility] [place]      [address]      [place], [place] [address]      [phone]        [facility]      [address]      [place], [place] [address]      [phone]       Also provides transitional housing for patients in their treatment program

## 2023-03-26 NOTE — Progress Notes (Signed)
   03/26/23 1201  [facility] Triage Screening (Walk-ins at [facility] only)  How Did You Hear About [place]? Legal System  What Is the Reason for Your Visit/Call Today? Pt [facility] to [facility] voluntarily escorted by [facility] due to auditory hallucinations. 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 Pt reports being prescribed suboxone and seroquel which he reports consuming yesterday. Pt reports hearing voices that call him names and reports hearing voices a few minutes ago. Pt is falling asleep during triage and asked this writer to check his pulse to see if he is still breathing, mumbled speech. Pt appears to be under the influence although he denies drug or alcohol use today. Pt reports he has not slept in a couple of days. Pt reports he is currenly homeless.Pt gives permission to speak with his brother Faheem Ziemann he can be reached at [phone]. Pt is lying down on the couch in the assessment room with his shirt over his head.Pt denies SI/HI and visual hallucinations at this time.  How Long Has This Been Causing You Problems? > than 6 months  Have You Recently Had Any Thoughts About Hurting Yourself? No  Are You Planning to Commit Suicide/Harm Yourself At This time? No  Have you Recently Had Thoughts About Hurting Someone [name]? No  Are You Planning To Harm Someone At This Time? No  Are you currently experiencing any auditory, visual or other hallucinations? Yes  Please explain the hallucinations you are currently experiencing: hearing voices a few minutes ago calling him names  Have You Used Any Alcohol or Drugs in the Past 24 Hours? Yes  How long ago did you use Drugs or Alcohol? yesterday  What Did You Use and How Much? heroin, about $20 worth intravenously  Do you have any current medical co-morbidities that require immediate attention? No  Clinician description of patient physical appearance/behavior: appears to be under the influence, disheveled, mumbled speech   What Do You Feel Would Help You the Most Today? Treatment for Depression or other mood problem;Alcohol or Drug Use Treatment  If access to [facility] was not available, would you have sought care in the Emergency Department? No  Determination of Need Urgent (48 hours)  Options For Referral [facility] Care;Medication Management;Outpatient Therapy;Inpatient Hospitalization

## 2023-03-26 NOTE — BH Assessment (Signed)
Comprehensive Clinical Assessment (CCA) Note  03/26/2023 Jason Buchanan [id number]  Disposition: Per Jason Emery, NP outpatient SA treatment is recommended.  SW to assist with SA resources.  The patient demonstrates the following risk factors for suicide: Chronic risk factors for suicide include: substance use disorder. Acute risk factors for suicide include: unemployment and loss (financial, interpersonal, professional). Protective factors for this patient include: responsibility to others (children, family) and hope for the future. Considering these factors, the overall suicide risk at this point appears to be low. Patient is appropriate for outpatient follow up.  Patient is a 37 year old male with a history of Opioid Use Disorder, severe and (reported Schizoaffective Disorder, unspecified) who presents voluntarily to [facility] for assessment.  Pt presents voluntarily escorted by [facility]-[facility] due to report of auditory hallucinations.  Patient initially reported using heroin daily about $20 worth (intravenously), last use was yesterday. Upon assessment he reports he has been inhaling heroin, with last use 5 days to a week ago.  He does admit to someone at [facility] giving him a pill which he admits he was aware it was Oxycodone.  Patient reports being prescribed suboxone and seroquel.  He states he is not allowed to take these medications at [facility] and he is requesting referral to a residential SA program that will allow him to take these meds.  He is informed that most residential programs will not allow MAT medications. Patient reports hearing voices that call him names and reports hearing voices this morning when he woke up.   Patient was falling asleep during triage and asked MHT to check his pulse to see if he is still breathing, mumbled speech. Patient appears to be under the influence although he denies drug or alcohol use today. He reports he has not slept in  a couple of days and has been sleeping outside since he left [facility].  Patient had to be woken up from sleeping soundly, with loud snoring.  He nodded off many times and begged to "lay down and sleep" several times.  Pt denies SI/HI and visual hallucinations at this time.  Upon discussion of discharge and referral to outpatient SA programs, patient became upset.  He was awake, demanding admisison to a program and became demanding.  He then told clinician and provider that he may "go kill myself and what would you say to my 8 year old son?"  He had consistently denied SI prior to discussion of discharge.    Chief Complaint:  Chief Complaint  Patient presents with   Hallucinations   Addiction Problem   Visit Diagnosis: Opioid Use Disorder, severe                             Patient report: Schizoaffective Disorder, unspecified    CCA Screening, Triage and Referral (STR)  Patient Reported Information How did you hear about [place]? Legal System  What Is the Reason for Your Visit/Call Today? Pt presents to [facility] voluntarily escorted by [facility] due to auditory hallucinations. 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 Pt reports being prescribed suboxone and seroquel which he reports consuming yesterday. Pt reports hearing voices that call him names and reports hearing voices a few minutes ago. Pt is falling asleep during triage and asked this writer to check his pulse to see if he is still breathing, mumbled speech. Pt appears to be under the  influence although he denies drug or alcohol use today. Pt reports he has not slept in a couple of days. Pt reports he is currenly homeless.Pt gives permission to speak with his brother Jason Buchanan he can be reached at [phone]. Pt is lying down on the couch in the assessment room with his shirt over his head.Pt denies SI/HI and visual hallucinations at this time.  How Long Has This Been Causing You Problems? > than 6  months  What Do You Feel Would Help You the Most Today? Treatment for Depression or other mood problem; Alcohol or Drug Use Treatment   Have You Recently Had Any Thoughts About Hurting Yourself? No  Are You Planning to Commit Suicide/Harm Yourself At This time? No   Flowsheet Row ED from 03/26/2023 in [facility] ED from 03/15/2023 in [facility] ED from 03/13/2023 in [facility] Emergency Department at [facility]  C-SSRS RISK CATEGORY No Risk No Risk No Risk       Have you Recently Had Thoughts About Hurting Someone Jason Buchanan? No  Are You Planning to Harm Someone at This Time? No  Explanation: N/A   Have You Used Any Alcohol or Drugs in the Past 24 Hours? Yes  What Did You Use and How Much? inconsistent reports from $20 IV and now mentions using 2g of heroin(inhaled) a week ago   Do You Currently Have a Therapist/Psychiatrist? Yes  Name of Therapist/Psychiatrist: Name of Therapist/Psychiatrist: Ms. Kerby Buchanan of [facility](?) for medication management   Have You Been Recently Discharged From Any Office Practice or Programs? No  Explanation of Discharge From Practice/Program: N/A     CCA Screening Triage Referral Assessment Type of Contact: Face-to-Face  Telemedicine Service Delivery:   Is this Initial or Reassessment?   Date Telepsych consult ordered in [facility]:    Time Telepsych consult ordered in [facility]:    Location of Assessment: [facility]  Provider Location: [facility] Assessment Services   Collateral Involvement: None   Does Patient Have a Automotive engineer Guardian? No  Legal Guardian Contact Information: N/A  Copy of Legal Guardianship Form: -- (N/A)  Legal Guardian Notified of Arrival: -- (N/A)  Legal Guardian Notified of Pending Discharge: -- (N/A)  If Minor and Not Living with Parent(s), Who has Custody? N/A  Is CPS involved or ever been involved? Never  Is APS involved or ever been  involved? Never   Patient Determined To Be At Risk for Harm To Self or Others Based on Review of Patient Reported Information or Presenting Complaint? No  Method: -- (N/A, no HI)  Availability of Means: -- (N/A, no HI)  Intent: -- (N/A, no HI)  Notification Required: -- (N/A, no HI)  Additional Information for Danger to Others Potential: -- (N/A, no HI)  Additional Comments for Danger to Others Potential: N/A, no HI  Are There Guns or Other Weapons in Your Home? No  Types of Guns/Weapons: N/A  Are These Weapons Safely Secured?                            -- (N/A)  Who Could Verify You Are Able To Have These Secured: N/A  Do You Have any Outstanding Charges, Pending Court Dates, Parole/Probation? None  Contacted To Inform of Risk of Harm To Self or Others: -- (N/A)    Does Patient Present under Involuntary Commitment? No    [place] of Residence: [place]  Patient Currently Receiving the Following Services: Medication Management   Determination of Need: Urgent (48 hours)   Options For Referral: [facility]; Medication Management; Outpatient Therapy; Inpatient Hospitalization     CCA Biopsychosocial Patient Reported Schizophrenia/Schizoaffective Diagnosis in Past: Yes   Strengths: Patient is aware of resources, and is able to advocate for himself   Mental Health Symptoms Depression:   Difficulty Concentrating; Irritability   Duration of Depressive symptoms:  Duration of Depressive Symptoms: Greater than two weeks   Mania:   None   Anxiety:    Tension; Worrying   Psychosis:   Hallucinations   Duration of Psychotic symptoms:  Duration of Psychotic Symptoms: Less than six months   Trauma:   None   Obsessions:   None   Compulsions:   None   Inattention:   N/A   Hyperactivity/Impulsivity:   N/A   Oppositional/Defiant Behaviors:   N/A   Emotional Irregularity:   None   Other Mood/Personality Symptoms:   Mild depression related  to ongoing SA issues    Mental Status Exam Appearance and self-care  Stature:   Average   Weight:   Average weight   Clothing:   Disheveled   Grooming:   Neglected   Cosmetic use:   None   Posture/gait:   Slumped   Motor activity:   Slowed   Sensorium  Attention:   Normal   Concentration:   Variable   Orientation:   Object; Person; Place; Time   Recall/memory:   Normal   Affect and Mood  Affect:   Blunted   Mood:   Depressed   Relating  Eye contact:   None   Facial expression:   Constricted   Attitude toward examiner:   Defensive; Irritable   Thought and Language  Speech flow:  Clear and Coherent   Thought content:   Appropriate to Mood and Circumstances   Preoccupation:   None   Hallucinations:   Auditory   Organization:   Engineer, site of Knowledge:   Average   Intelligence:   Average   Abstraction:   Functional   Judgement:   Fair   Dance movement psychotherapist:   Adequate   Insight:   Fair; Gaps   Decision Making:   Impulsive; Vacilates   Social Functioning  Social Maturity:   Irresponsible   Social Judgement:   Naive   Stress  Stressors:   Family conflict; Financial   Coping Ability:   Exhausted   Skill Deficits:   Communication; Interpersonal; Self-control   Supports:   Family     Religion: Religion/Spirituality Are You A Religious Person?: No How Might This Affect Treatment?: NA  Leisure/Recreation: Leisure / Recreation Do You Have Hobbies?: No  Exercise/Diet: Exercise/Diet Do You Exercise?: No Have You Gained or Lost A Significant Amount of Weight in the Past Six Months?: No Do You Follow a Special Diet?: No Do You Have Any Trouble Sleeping?: Yes Explanation of Sleeping Difficulties: States he hasn't slept in "days" and tries to lay back down to sleep during assessment   CCA Employment/Education Employment/Work Situation: Employment / Work  Situation Employment Situation: Unemployed Has Patient ever Been in Equities trader?: No  Education: Education Is Patient Currently Attending School?: No Last Grade Completed: 12 Did You Product manager?: No Did You Have An Individualized Education Program (IIEP): No Did You Have Any Difficulty At [employer]?: No Patient's Education Has Been Impacted by Current Illness: No   CCA Family/Childhood History Family and  Relationship History: Family history Marital status: Single Does patient have children?: Yes How many children?: 1 How is patient's relationship with their children?: mentions 12 y.o. son, he does not share about their relationship  Childhood History:  Childhood History By whom was/is the patient raised?: Mother Did patient suffer any verbal/emotional/physical/sexual abuse as a child?: No Did patient suffer from severe childhood neglect?: No Has patient ever been sexually abused/assaulted/raped as an adolescent or adult?: No Was the patient ever a victim of a crime or a disaster?: No Witnessed domestic violence?: No Has patient been affected by domestic violence as an adult?: No       CCA Substance Use Alcohol/Drug Use: Alcohol / Drug Use Pain Medications: See MAR Prescriptions: See MAR Over the Counter: See MAR History of alcohol / drug use?: Yes Longest period of sobriety (when/how long): NA Negative Consequences of Use: Financial Withdrawal Symptoms: Irritability Substance #1 Name of Substance 1: Heroin, fentanyl 1 - Age of First Use: unknown 1 - Amount (size/oz): varies 1 - Frequency: Pt gives inconsistent reports 1 - Duration: Pt gives inconsistent reports 1 - Last Use / Amount: unknown - mentions 2 days ago vs a week ago 1 - Method of Aquiring: NA 1- Route of Use: inhalation                       ASAM's:  Six Dimensions of Multidimensional Assessment  Dimension 1:  Acute Intoxication and/or Withdrawal Potential:   Dimension 1:  Description  of individual's past and current experiences of substance use and withdrawal: some signs of intoxication  Dimension 2:  Biomedical Conditions and Complications:   Dimension 2:  Description of patient's biomedical conditions and  complications: adequate ability to cope with physical discomfort  Dimension 3:  Emotional, Behavioral, or Cognitive Conditions and Complications:  Dimension 3:  Description of emotional, behavioral, or cognitive conditions and complications: underlying depression, AH, states he is Rx medications  Dimension 4:  Readiness to Change:  Dimension 4:  Description of Readiness to Change criteria: states he is motivated towards treatment, however sabotaged [facility] placement a couple of days ago by relapse  Dimension 5:  Relapse, Continued use, or Continued Problem Potential:  Dimension 5:  Relapse, continued use, or continued problem potential critiera description: minimal awareness of SA/MI relapse issues  Dimension 6:  Recovery/Living Environment:  Dimension 6:  Recovery/Iiving environment criteria description: denies having support  ASAM Severity Score: ASAM's Severity Rating Score: 9  ASAM Recommended Level of Treatment: ASAM Recommended Level of Treatment: Level III Residential Treatment   Substance use Disorder (SUD) Substance Use Disorder (SUD)  Checklist Symptoms of Substance Use: Continued use despite persistent or recurrent social, interpersonal problems, caused or exacerbated by use, Evidence of tolerance, Persistent desire or unsuccessful efforts to cut down or control use, Presence of craving or strong urge to use, Recurrent use that results in a failure to fulfill major role obligations (work, school, home), Social, occupational, recreational activities given up or reduced due to use  Recommendations for Services/Supports/Treatments: Recommendations for Services/Supports/Treatments Recommendations For Services/Supports/Treatments: Medication Management, CD-IOP Intensive  Chemical Dependency Program, Peer Support  Discharge Disposition:    DSM5 Diagnoses: Patient Active Problem List   Diagnosis Date Noted   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   Acute psychosis 01/28/2023   Lactic acidosis 01/14/2023   Abscess of left hand 01/13/2023   Elevated transaminase level 01/13/2023   Mood disorder 01/13/2023   Alcohol use 01/13/2023  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     Referrals to Alternative Service(s): Referred to Alternative Service(s):   Place:   Date:   Time:    Referred to Alternative Service(s):   Place:   Date:   Time:    Referred to Alternative Service(s):   Place:   Date:   Time:    Referred to Alternative Service(s):   Place:   Date:   Time:     Yetta Glassman, [facility]

## 2023-03-26 NOTE — ED Notes (Signed)
All of patients belongings returned, pt given sandwich, bus pass and was ambulatory to lobby with steady gait

## 2023-05-24 NOTE — Congregational Nurse Program (Addendum)
  Dept: [phone]   Congregational Nurse Program Note  Date of Encounter: 05/24/2023  Client visit, client requested blood pressure only and asked for assistance with medication refills. BP 114/78   Pulse 85   Client reported at 12:01pm at the [employer] with nurse, he is being treated for substance abuse at [facility] and staff there helped him apply for [facility] Monday, addendum by nurse due to selecting "sign" by mistake, Monday 05-21-23 date of Medicaid application. Client reported he is currently at [employer] [facility] for housing.  Reported he will run out of medications in 1.5 weeks. He stated previous medications were filled at [facility] and by Lavinia Sharps at [facility] in [place], [place]. Client stated "he can not leave [place], [place] due to current treatment with Suboxone at [facility] unless he has a chaperone". Client did not have his prescriptions with him, he stated he needs the following medications refilled:  "Gabapentin 300 mg three times a day, Seroquel 200 mg 1 at night, Trazodone 50 mg 1 at night, protonix 40 mg 1 time per day, vistaril 20 mg 3 times per day, flonase 3 times per day, voltaren cream 3 times per day." Instructed client to go to [facility] [address] [place], [place] next Wednesday, 05-30-23 for a virtual visit with [facility] from 9 am-12 pm.  If unsuccessful with that visit, informed client the [place] with providers will be at [employer] next Friday 06-01-23. If still unsuccessful nurse provided client with office number to call.   Client verbalized understanding of instructions.  Julianne Handler RN [place] [facility] [employer] Nurse [facility] [phone]  Past Medical History: Past Medical History:  Diagnosis Date   Asthma    Mood disorder (HCC)    Polysubstance abuse (HCC)    Schizophrenia (HCC)     Encounter Details: CNP Questionnaire was completed at  12:10pm  CNP Questionnaire - 05/24/23 2002       Questionnaire   Ask client: Do you give verbal consent for me to treat you today? Yes    Student Assistance N/A    Location Patient Served  [facility] [place]    Visit Setting with Client [facility] Zenaida Niece    Patient Status Unknown    Insurance Unknown    Insurance/Financial Assistance Referral N/A    Medication Have Medication Insecurities    Medical Provider No    Screening Referrals Made N/A    Medical Referrals Made [facility] Visit    Medical Appointment Made N/A    Recently w/o PCP, now 1st time PCP visit completed due to CNs referral or appointment made N/A    Food Have Food Insecurities    Transportation Need transportation assistance    Housing/Utilities N/A    Interpersonal Safety N/A    Interventions Advocate/Support;Navigate Healthcare System;Counsel;Educate    Abnormal to Normal Screening Since Last CN Visit N/A    Screenings CN Performed Blood Pressure    Sent Client to Lab for: N/A    Did client attend any of the following based off CNs referral or appointments made? N/A    ED Visit Averted N/A    Life-Saving Intervention Made N/A

## 2023-08-10 ENCOUNTER — Encounter (HOSPITAL_COMMUNITY): Payer: Self-pay | Admitting: *Deleted

## 2023-08-10 ENCOUNTER — Emergency Department (EMERGENCY_DEPARTMENT_HOSPITAL)
Admission: EM | Admit: 2023-08-10 | Discharge: 2023-08-11 | Disposition: A | Discharge status: Home / Self Care | Payer: MEDICAID | Source: Home / Self Care | Attending: Emergency Medicine | Admitting: Emergency Medicine

## 2023-08-10 ENCOUNTER — Emergency Department (HOSPITAL_COMMUNITY): Payer: MEDICAID

## 2023-08-10 ENCOUNTER — Emergency Department (HOSPITAL_COMMUNITY)
Admission: EM | Admit: 2023-08-10 | Discharge: 2023-08-10 | Disposition: A | Discharge status: Home / Self Care | Payer: MEDICAID | Attending: Emergency Medicine | Admitting: Emergency Medicine

## 2023-08-10 ENCOUNTER — Other Ambulatory Visit: Payer: Self-pay

## 2023-08-10 ENCOUNTER — Encounter (HOSPITAL_COMMUNITY): Payer: Self-pay

## 2023-08-10 DIAGNOSIS — R45851 Suicidal ideations: Secondary | ICD-10-CM | POA: Insufficient documentation

## 2023-08-10 DIAGNOSIS — F1910 Other psychoactive substance abuse, uncomplicated: Secondary | ICD-10-CM | POA: Diagnosis present

## 2023-08-10 DIAGNOSIS — Z5982 Transportation insecurity: Secondary | ICD-10-CM | POA: Diagnosis not present

## 2023-08-10 DIAGNOSIS — F419 Anxiety disorder, unspecified: Secondary | ICD-10-CM | POA: Insufficient documentation

## 2023-08-10 DIAGNOSIS — J45909 Unspecified asthma, uncomplicated: Secondary | ICD-10-CM | POA: Diagnosis not present

## 2023-08-10 LAB — TROPONIN I (HIGH SENSITIVITY): Troponin I (High Sensitivity): 4 ng/L (ref <18)

## 2023-08-10 LAB — CBC WITH DIFFERENTIAL/PLATELET
Abs Immature Granulocytes: 0.02 10*3/uL (ref 0.00–0.07)
Basophils Absolute: 0.1 10*3/uL (ref 0.0–0.1)
Basophils Relative: 1 %
Eosinophils Absolute: 0.5 10*3/uL (ref 0.0–0.5)
Eosinophils Relative: 7 %
HCT: 36.2 % — ABNORMAL LOW (ref 39.0–52.0)
Hemoglobin: 12.1 g/dL — ABNORMAL LOW (ref 13.0–17.0)
Immature Granulocytes: 0 %
Lymphocytes Relative: 31 %
Lymphs Abs: 2.2 10*3/uL (ref 0.7–4.0)
MCH: 29.4 pg (ref 26.0–34.0)
MCHC: 33.4 g/dL (ref 30.0–36.0)
MCV: 88.1 fL (ref 80.0–100.0)
Monocytes Absolute: 0.7 10*3/uL (ref 0.1–1.0)
Monocytes Relative: 10 %
Neutro Abs: 3.6 10*3/uL (ref 1.7–7.7)
Neutrophils Relative %: 51 %
Platelets: 269 10*3/uL (ref 150–400)
RBC: 4.11 MIL/uL — ABNORMAL LOW (ref 4.22–5.81)
RDW: 12.9 % (ref 11.5–15.5)
WBC: 7 10*3/uL (ref 4.0–10.5)
nRBC: 0 % (ref 0.0–0.2)

## 2023-08-10 LAB — COMPREHENSIVE METABOLIC PANEL
ALT: 80 U/L — ABNORMAL HIGH (ref 0–44)
AST: 50 U/L — ABNORMAL HIGH (ref 15–41)
Albumin: 3.7 g/dL (ref 3.5–5.0)
Alkaline Phosphatase: 88 U/L (ref 38–126)
Anion gap: 13 (ref 5–15)
BUN: 12 mg/dL (ref 6–20)
CO2: 22 mmol/L (ref 22–32)
Calcium: 8.8 mg/dL — ABNORMAL LOW (ref 8.9–10.3)
Chloride: 106 mmol/L (ref 98–111)
Creatinine, Ser: 0.66 mg/dL (ref 0.61–1.24)
GFR, Estimated: >60 mL/min (ref >60)
Glucose, Bld: 101 mg/dL — ABNORMAL HIGH (ref 70–99)
Potassium: 3.3 mmol/L — ABNORMAL LOW (ref 3.5–5.1)
Sodium: 141 mmol/L (ref 135–145)
Total Bilirubin: 1 mg/dL (ref 0.3–1.2)
Total Protein: 6.4 g/dL — ABNORMAL LOW (ref 6.5–8.1)

## 2023-08-10 LAB — SALICYLATE LEVEL: Salicylate Lvl: <7 mg/dL — ABNORMAL LOW (ref 7.0–30.0)

## 2023-08-10 LAB — RAPID URINE DRUG SCREEN, HOSP PERFORMED
Amphetamines: NOT DETECTED
Barbiturates: NOT DETECTED
Benzodiazepines: NOT DETECTED
Cocaine: POSITIVE — AB
Opiates: NOT DETECTED
Tetrahydrocannabinol: POSITIVE — AB

## 2023-08-10 LAB — ETHANOL: Alcohol, Ethyl (B): <10 mg/dL (ref <10)

## 2023-08-10 LAB — ACETAMINOPHEN LEVEL: Acetaminophen (Tylenol), Serum: <10 ug/mL — ABNORMAL LOW (ref 10–30)

## 2023-08-10 MED ORDER — OLANZAPINE 5 MG PO TABS: 5.0000 mg | ORAL_TABLET | Freq: Every day | ORAL | Status: DC

## 2023-08-10 MED ORDER — MAGNESIUM OXIDE -MG SUPPLEMENT 400 (240 MG) MG PO TABS
800.0000 mg | ORAL_TABLET | Freq: Once | ORAL | Status: AC
  Administered 2023-08-10: 800 mg via ORAL
  Filled 2023-08-10: qty 2

## 2023-08-10 MED ORDER — POTASSIUM CHLORIDE CRYS ER 20 MEQ PO TBCR
40.0000 meq | EXTENDED_RELEASE_TABLET | Freq: Once | ORAL | Status: AC
  Administered 2023-08-10: 40 meq via ORAL
  Filled 2023-08-10: qty 2

## 2023-08-10 MED ORDER — IBUPROFEN 800 MG PO TABS
800.0000 mg | ORAL_TABLET | Freq: Once | ORAL | Status: AC
  Administered 2023-08-10: 800 mg via ORAL
  Filled 2023-08-10: qty 1

## 2023-08-10 MED ORDER — SODIUM CHLORIDE 0.9 % IV BOLUS
2000.0000 mL | Freq: Once | INTRAVENOUS | Status: AC
  Administered 2023-08-10: 2000 mL via INTRAVENOUS

## 2023-08-10 NOTE — ED Notes (Signed)
No responses to internal stimuli noted when pt is awake. Pt w/ excessive mannerisms when being woken up and has multiple requests while awake.

## 2023-08-10 NOTE — ED Triage Notes (Signed)
Patient states he was here earlier and now his is having SI thoughts , states he is thinking about going home and shooting himself.

## 2023-08-10 NOTE — Progress Notes (Signed)
LCSW Progress Note  [id number]   Jason Buchanan  08/10/2023  4:10 PM  Description:   Inpatient Psychiatric Referral  Patient was recommended inpatient per Phebe Colla, NP. There are no available beds at [facility], per [facility] Sharyne Peach, RN. Patient was referred to the following out of network facilities:   Destination  Service Provider Address Phone Fax  [facility]  [place] [address] [phone]  [facility]  [address] [place] [address] [phone]  [facility] [address]  [address] [place] [address] [phone]  [facility] [address] [place]  [address] [place], [place] [address] [phone]  [facility]  [address] [place], [place] [address] [phone]  [facility]  [address] [place], [place] [address] [phone]  [facility]  [address] [place] [address] [phone]  [facility]  [address] [place] [address] [phone]  [facility]  [address] [place] [address] [phone]  [facility]  [address] [place] [address] [phone]  [facility]  [address], [place] [address] [phone]  [facility]  [address], [place] [address] [phone]  [facility]  [place] [phone]  [facility] EFAX  [address] [place], [place] [phone]  [facility]  [address], [place] [address] [phone]  [facility]  [address], [place] [address] [phone]  [facility]  [address] [place], [place] [address] [phone]  [facility]  [address], [place] [address] [phone]  [facility] EFAX  [place] [phone]    Situation ongoing, CSW to continue following and update chart as more information becomes available.      Cathie Beams, LCSW  08/10/2023 4:10 PM
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
## 2023-08-10 NOTE — ED Notes (Signed)
Patient wanded by security changed into scrubs

## 2023-08-10 NOTE — ED Notes (Signed)
Pt. Belongings in purple locker 13 and 14. Pt signed belongings paper. Pt. Changed into purple scrubs and wanded by Mr. Demetrius Charity. Phone locked w/ security as well and signed by pt. Purple RN notified. Pt. To be moved to yellow 40.

## 2023-08-10 NOTE — ED Notes (Signed)
Pt ambulated in hallway independently with steady gait.

## 2023-08-10 NOTE — Progress Notes (Signed)
Patient has been denied by [facility] due to no appropriate beds. Patient meets [facility] inpatient criteria per Phebe Colla, NP. Patient has been faxed out to the following facilities:   [facility]  [place] [address] [phone]  [facility]  [address] [place] [address] [phone]  [facility] [address]  [address] [place] [address] [phone]  [facility] [address] [place]  [address] [place], [place] [address] [phone]  [facility]  [address] [place], [place] [address] [phone]  [facility]  [address] [place], [place] [address] [phone]  [facility]  [address] [place] [address] [phone]  [facility]  [address] [place] [address] [phone]  [facility]  [address], [place] [address] [phone]  [facility]  [address], [place] [address] [phone]  [facility]  [address] [place] [address] [phone]  [facility]  [address] [place] [address] [phone]  [facility]  [place] [phone]  [facility] EFAX  [address] [place], [place] [phone]  [facility]  [address], [place] [address] [phone]  [facility]  [address], [place] [address] [phone]  [facility]  [address] [place], [place] [address] [phone]  [facility]  [address], [place] [address] [phone]  [facility] [place]  [place] [phone]   Damita Dunnings, MSW, LCSW-A  7:29 PM 08/10/2023

## 2023-08-10 NOTE — ED Triage Notes (Addendum)
Pt is coming in for mental health problems, he says he doesn't feel say but also doesn't feel his medications are working so he has not been taking them. Also mentions he has been walking a lot recently and states he fell table with some minor complaints of chest pain as well.   Medic vitals   148/82 110hr 100%ra 108bgl

## 2023-08-10 NOTE — Progress Notes (Signed)
[facility] LCSW Progress Note   08/10/2023    7:45 PM  REXTON HELSER   [id number]   Type of Contact and Topic:  Psychiatric Bed Placement   Pt accepted to [facility]      Patient meets inpatient criteria per Phebe Colla, NP  The attending provider will be Dr. Sofie Hartigan   Call report to [phone]  Denton Ar, RN @ [facility] ED notified.     Pt scheduled  to arrive at [facility] AFTER 0900.   Damita Dunnings, MSW, LCSW-A  7:46 PM 08/10/2023

## 2023-08-10 NOTE — Discharge Instructions (Signed)

## 2023-08-10 NOTE — ED Notes (Signed)
Pt received meal tray and is in room eating lunch, no complaints.

## 2023-08-10 NOTE — ED Provider Notes (Signed)
[place] EMERGENCY DEPARTMENT AT [facility] Provider Note   CSN: [id number] Arrival date & time: 08/10/23  4098     History Chief Complaint  Patient presents with   Suicidal    CORBITT FOSS is a 37 y.o. male patient who presents to the emergency department with suicidal ideations started prior to arrival.  Patient was seen and evaluated here last night for psychiatric symptoms but was ultimately discharged the patient was not exhibiting any suicidal ideations or behavior.  Patient returns today with suicidal ideations stating he plans to go home and shoot himself.  He is requesting help to "calm his mind."  Last used any drugs 2 days ago.  He currently denies any complaints.  HPI     Home Medications Prior to Admission medications   Medication Sig Start Date End Date Taking? Authorizing Provider  albuterol (VENTOLIN HFA) 108 (90 Base) MCG/ACT inhaler Inhale 2 puffs into the lungs every 6 (six) hours as needed for wheezing or shortness of breath.    [provider]  fluticasone (FLONASE ALLERGY RELIEF) 50 MCG/ACT nasal spray Place 1 spray into both nostrils in the morning and at bedtime.    [provider]  gabapentin (NEURONTIN) 300 MG capsule Take 1 capsule (300 mg total) by mouth 3 (three) times daily. 03/19/23 04/18/23  Lamar Sprinkles, MD  hydrOXYzine (ATARAX) 50 MG tablet Take 1 tablet (50 mg total) by mouth every 6 (six) hours as needed for anxiety. 03/19/23   Lamar Sprinkles, MD  naloxone [facility]) nasal spray 4 mg/0.1 mL Take as needed for Narcotic overdose. Patient not taking: Reported on 03/14/2023 12/12/22   Glyn Ade, MD  nicotine (NICODERM CQ - DOSED IN MG/24 HOURS) 21 mg/24hr patch Place 1 patch (21 mg total) onto the skin daily. 03/19/23   Lamar Sprinkles, MD  OPCON-A 0.027-0.315 % SOLN Place 1 drop into both eyes 3 (three) times daily as needed (for itching).    [provider]  QUEtiapine (SEROQUEL XR) 200 MG 24 hr tablet Take  1 tablet (200 mg total) by mouth at bedtime. Take 250 mg total by mouth nightly at bedtime (200 mg tablet and 50 mg tablet) 03/19/23 04/18/23  Lamar Sprinkles, MD  QUEtiapine (SEROQUEL XR) 50 MG TB24 24 hr tablet Take 1 tablet (50 mg total) by mouth at bedtime. Take 250 mg total by mouth nightly at bedtime (200 mg tablet and 50 mg tablet) 03/19/23 04/18/23  Lamar Sprinkles, MD  ZYRTEC ALLERGY 10 MG tablet Take 10 mg by mouth at bedtime.    [provider]      Allergies    Bee venom, Acetaminophen, Succinylcholine, and Chlorhexidine    Review of Systems   Review of Systems  All other systems reviewed and are negative.   Physical Exam Updated Vital Signs BP 130/80   Pulse 84   Temp 97.7 F (36.5 C) (Oral)   Resp 15   Ht 5\' 5"  (1.651 m)   Wt 63.5 kg   SpO2 98%   BMI 23.30 kg/m  Physical Exam Vitals and nursing note reviewed.  Constitutional:      General: He is not in acute distress.    Appearance: Normal appearance.  HENT:     Head: Normocephalic and atraumatic.  Eyes:     General:        Right eye: No discharge.        Left eye: No discharge.  Cardiovascular:     Comments: Regular rate and rhythm.  S1/S2 are distinct without any evidence of murmur, rubs, or gallops.  Radial pulses are 2+ bilaterally.  Dorsalis pedis pulses are 2+ bilaterally.  No evidence of pedal edema. Pulmonary:     Comments: Clear to auscultation bilaterally.  Normal effort.  No respiratory distress.  No evidence of wheezes, rales, or rhonchi heard throughout. Abdominal:     General: Abdomen is flat. Bowel sounds are normal. There is no distension.     Tenderness: There is no abdominal tenderness. There is no guarding or rebound.  Musculoskeletal:        General: Normal range of motion.     Cervical back: Neck supple.  Skin:    General: Skin is warm and dry.     Findings: No rash.  Neurological:     General: No focal deficit present.     Mental Status: He is alert.  Psychiatric:         Mood and Affect: Affect is blunt.        Behavior: Behavior normal.     ED Results / Procedures / Treatments   Labs (all labs ordered are listed, but only abnormal results are displayed) Labs Reviewed  RAPID URINE DRUG SCREEN, HOSP PERFORMED - Abnormal; Notable for the following components:      Result Value   Cocaine POSITIVE (*)    Tetrahydrocannabinol POSITIVE (*)    All other components within normal limits  COMPREHENSIVE METABOLIC PANEL  ETHANOL  CBC WITH DIFFERENTIAL/PLATELET  ACETAMINOPHEN LEVEL  SALICYLATE LEVEL    EKG None  Radiology DG Chest 2 View  Result Date: 08/10/2023 CLINICAL DATA:  Chest pain EXAM: CHEST - 2 VIEW COMPARISON:  08/10/2023 FINDINGS: Heart and mediastinal contours are within normal limits. No focal opacities or effusions. No acute bony abnormality. IMPRESSION: No active cardiopulmonary disease. Electronically Signed   By: Charlett Nose M.D.   On: 08/10/2023 02:25   DG Knee Left Port  Result Date: 08/10/2023 CLINICAL DATA:  Chest pains and left knee pains. EXAM: PORTABLE LEFT KNEE - 1-2 VIEW; PORTABLE CHEST - 1 VIEW COMPARISON:  PA Lat chest 07/13/2022.  No prior left knee study. FINDINGS: Chest AP portable at 1:11 a.m.: The cardiomediastinal silhouette and vascular pattern are normal. There is no substantial pleural effusion. There is increased hazy opacity projected over the left lower lung field lateral to the heart apex and could be due to an infiltrate or asymmetric chest wall attenuation. Rest of the lungs are clear. Follow-up PA and lateral standing views recommended to see if this persists. No new osseous findings. Thoracic spondylosis. Slight thoracic levoscoliosis. Left knee, AP and lateral only: An intramedullary rod in the distal left femur is partially visible. The visualized portion is unremarkable. There is normal bone mineralization. There is no evidence of fracture, dislocation or degenerative changes. No significant suprapatellar bursal fluid  is seen. The superficial soft tissues are unremarkable. There are artifacts from overlying clothing. IMPRESSION: 1. Increased hazy opacity projected over the left lower lung field lateral to the heart apex, could be due to an infiltrate or asymmetric chest wall attenuation. Follow-up PA and lateral standing views recommended to see if this persists. 2. No evidence of acute left knee abnormality. Femoral intramedullary rod partially visible. 3. Thoracic spondylosis. Electronically Signed   By: Almira Bar M.D.   On: 08/10/2023 01:27   DG Chest Port 1 View  Result Date: 08/10/2023 CLINICAL DATA:  Chest pains and left knee pains. EXAM: PORTABLE LEFT KNEE - 1-2 VIEW; PORTABLE CHEST -  1 VIEW COMPARISON:  PA Lat chest 07/13/2022.  No prior left knee study. FINDINGS: Chest AP portable at 1:11 a.m.: The cardiomediastinal silhouette and vascular pattern are normal. There is no substantial pleural effusion. There is increased hazy opacity projected over the left lower lung field lateral to the heart apex and could be due to an infiltrate or asymmetric chest wall attenuation. Rest of the lungs are clear. Follow-up PA and lateral standing views recommended to see if this persists. No new osseous findings. Thoracic spondylosis. Slight thoracic levoscoliosis. Left knee, AP and lateral only: An intramedullary rod in the distal left femur is partially visible. The visualized portion is unremarkable. There is normal bone mineralization. There is no evidence of fracture, dislocation or degenerative changes. No significant suprapatellar bursal fluid is seen. The superficial soft tissues are unremarkable. There are artifacts from overlying clothing. IMPRESSION: 1. Increased hazy opacity projected over the left lower lung field lateral to the heart apex, could be due to an infiltrate or asymmetric chest wall attenuation. Follow-up PA and lateral standing views recommended to see if this persists. 2. No evidence of acute left knee  abnormality. Femoral intramedullary rod partially visible. 3. Thoracic spondylosis. Electronically Signed   By: Almira Bar M.D.   On: 08/10/2023 01:27    Procedures Procedures    Medications Ordered in ED Medications - No data to display  ED Course/ Medical Decision Making/ A&P Clinical Course as of 08/10/23 0810  Fri Aug 10, 2023  0808 Rapid urine drug screen (hospital performed)(!) Positive for cocaine and [facility] [CF]    Clinical Course User Index [CF] Teressa Lower, PA-C   {   Click here for ABCD2, HEART and other calculators  Medical Decision Making NICKEY BECK is a 37 y.o. male patient who presents to the emergency department today for further evaluation of suicidal ideations.  Patient is actively exhibiting suicidal ideations.  It is questionable whether the patient is truly having suicidal ideations as he was here earlier last night for similar symptoms but is not exhibiting any suicidal behavior at that time.  Patient had labs drawn this morning around 2 AM.  I reviewed these.  He is medically cleared.  Will place TTS consult.  Amount and/or Complexity of Data Reviewed Labs: ordered. Decision-making details documented in ED Course.    Final Clinical Impression(s) / ED Diagnoses Final diagnoses:  None    Rx / DC Orders ED Discharge Orders     None         Teressa Lower, [place] 08/10/23 0919    Alvira Monday, MD 08/10/23 [phone]

## 2023-08-10 NOTE — Consult Note (Signed)
[facility] ED ASSESSMENT   Reason for Consult:  Suicidal ideations Referring Physician:  Earnestine Leys Patient Identification: Jason Buchanan MRN:  [id number] ED Chief Complaint: Suicidal ideation  Diagnosis:  Principal Problem:   Suicidal ideation Active Problems:   Polysubstance abuse [facility])   ED Assessment Time Calculation: Start Time: 1250 Stop Time: 1355 Total Time in Minutes (Assessment Completion): 65  HPI:  [name] is a 37 y.o. male patient presented to the [facility] ED saying he is having suicidal thoughts.  Patient has a history of polysubstance abuse, suicidal ideation, psychosis and mood disorder.    Subjective:   [name] is a 37 y.o. male patient presented to the [facility] ED saying he is having suicidal thoughts.  Patient has a history of polysubstance abuse, suicidal ideation, psychosis and mood disorder.     [name], 37 y.o., male patient seen face to face by this provider, consulted with Dr. Lucianne Muss; and chart reviewed on 08/10/23.  On evaluation DALONTA Buchanan reports he is suicidal.  He says he was thinking about shooting himself in the head with a gun, but is concerned he would mess up and be left with half a face. He has decided it would be better to just shoot up with fentanyl so he can just go to sleep and never wake up.  He says that his child lives with his mother who has full custody and they moved in with another man.  He says he is also still grieving the loss of his mother 8 months ago.  Patient says his older brother killed himself "over a girl" and he is hearing voices telling him "you're a piece of shit" and he says he is seeing "all kinds of shit; it's scary man."    During evaluation Jason Buchanan is sitting on a stretcher and is very fidgety.  He is alert, oriented x 4, calm, cooperative and attentive.  His mood is depressed and hopeless with congruent affect.  He has garbled speech, and behavior.  Objectively there is evidence of  psychosis and delusional thinking.  Patient is able to converse coherently and has goal directed thoughts.  He endorses suicidal ideation, psychosis, and paranoia.  Patient answered questions appropriately.     Patient is a danger to himself and is experiencing psychotic symptoms.  He requires inpatient psychiatric hospitalization for stabilization and treatment.  Past Psychiatric History: polysubstance abuse, suicidal ideation and psychosis  Risk to Self or Others: Is the patient at risk to self? Yes Has the patient been a risk to self in the past 6 months? Yes Has the patient been a risk to self within the distant past? Yes Is the patient a risk to others? No Has the patient been a risk to others in the past 6 months? No Has the patient been a risk to others within the distant past? No  [place] Scale:  Flowsheet Row ED from 08/10/2023 in [facility] Department at [facility] Most recent reading at 08/10/2023  6:23 AM ED from 08/10/2023 in [facility] Emergency Department at [facility] Most recent reading at 08/10/2023 12:50 AM ED from 03/26/2023 in [facility] Most recent reading at 03/26/2023 12:10 PM  C-SSRS RISK CATEGORY High Risk No Risk No Risk       Substance Abuse:   Polysubstance abuse  Past Medical History:  Past Medical History:  Diagnosis Date   Asthma    Mood disorder (  HCC)    Polysubstance abuse (HCC)    Schizophrenia (HCC)     Past Surgical History:  Procedure Laterality Date   ANKLE SURGERY     APPLICATION OF WOUND VAC Right 11/22/2021   Procedure: APPLICATION OF WOUND VAC;  Surgeon: Knute Neu, MD;  Location: [facility];  Service: Plastics;  Laterality: Right;   APPLICATION OF WOUND VAC Right 11/25/2021   Procedure: APPLICATION OF WOUND VAC;  Surgeon: Knute Neu, MD;  Location: [facility];  Service: Plastics;  Laterality: Right;   APPLICATION OF WOUND VAC Right 12/07/2021   Procedure: APPLICATION OF WOUND VAC;   Surgeon: Knute Neu, MD;  Location: [facility];  Service: Plastics;  Laterality: Right;   APPLICATION OF WOUND VAC Right 12/14/2021   Procedure: APPLICATION OF WOUND VAC;  Surgeon: [name], MD;  Location: [facility];  Service: Plastics;  Laterality: Right;   APPLICATION OF WOUND VAC Right 12/22/2021   Procedure: WOUND VAC CHANGE;  Surgeon: [name], MD;  Location: [facility];  Service: Plastics;  Laterality: Right;   APPLICATION OF WOUND VAC Right 12/29/2021   Procedure: APPLICATION OF WOUND VAC;  Surgeon: [name], MD;  [facility]: [facility];  Service: Plastics;  Laterality: Right;   APPLICATION OF WOUND VAC Right 01/03/2022   Procedure: WOUND VAC APPLICATION;  Surgeon: [name], MD;  [facility]: [facility];  Service: Plastics;  Laterality: Right;   BIOPSY  11/17/2021   Procedure: BIOPSY;  Surgeon: Vida Rigger, MD;  [facility]: [facility];  Service: Endoscopy;;   BUBBLE STUDY  09/30/2021   Procedure: BUBBLE STUDY;  Surgeon: Orpah Cobb, MD;  Location: [facility] ENDOSCOPY;  Service: Cardiovascular;;   DEBRIDEMENT AND CLOSURE WOUND Right 01/03/2022   Procedure: DEBRIDEMENT AND COMPLEX CLOSURE OF RIGHT ARM WOUND;  Surgeon: [name], MD;  [facility]: [facility];  Service: Plastics;  Laterality: Right;   ESOPHAGOGASTRODUODENOSCOPY (EGD) WITH PROPOFOL N/A 11/17/2021   Procedure: ESOPHAGOGASTRODUODENOSCOPY (EGD) WITH PROPOFOL;  Surgeon: Vida Rigger, MD;  Location: [facility];  Service: Endoscopy;  Laterality: N/A;   FRACTURE SURGERY     I & D EXTREMITY Right 09/27/2021   Procedure: IRRIGATION AND DEBRIDEMENT OF ABSCESS RIGHT HAND;  Surgeon: Knute Neu, MD;  Location: [facility];  Service: Plastics;  Laterality: Right;   I & D EXTREMITY Bilateral 11/15/2021   Procedure: IRRIGATION AND DEBRIDEMENT EXTREMITY;  Surgeon: Knute Neu, MD;  Location: [facility];  Service: Plastics;  Laterality: Bilateral;   I & D EXTREMITY Right 11/17/2021   Procedure: IRRIGATION AND DEBRIDEMENT EXTREMITY;  Surgeon:  Knute Neu, MD;  Location: [facility];  Service: Plastics;  Laterality: Right;   I & D EXTREMITY Bilateral 12/01/2021   Procedure: INCISION AND DRAINAGE BILATERAL UPPER EXTREMITIES WITH  WOUND VAC CHANGE RIGHT;  Surgeon: Gomez Cleverly, MD;  Location: [facility];  Service: Orthopedics;  Laterality: Bilateral;   I & D EXTREMITY Right 12/07/2021   Procedure: IRRIGATION AND DEBRIDEMENT EXTREMITY, MANIPULATION OF FINGERS UNDER ANESTHESIA;  Surgeon: Knute Neu, MD;  Location: [facility];  Service: Plastics;  Laterality: Right;   I & D EXTREMITY Left 01/13/2023   Procedure: IRRIGATION AND DEBRIDEMENT EXTREMITY;  Surgeon: Bradly Bienenstock, MD;  Location: [facility];  Service: Orthopedics;  Laterality: Left;   INCISION AND DRAINAGE OF WOUND Left 09/27/2021   Procedure: IRRIGATION AND DEBRIDEMENT OF ABSCESS LEFT INDEX FINGER;  Surgeon: Knute Neu, MD;  Location: [facility];  Service: Plastics;  Laterality: Left;   INCISION AND DRAINAGE OF WOUND Bilateral 11/22/2021   Procedure: IRRIGATION AND DEBRIDEMENT WOUND;  Surgeon: Knute Neu, MD;  [facility]: [facility];  Service: Plastics;  Laterality: Bilateral;   INCISION AND DRAINAGE OF WOUND Bilateral 11/25/2021   Procedure: BILATERAL IRRIGATION AND DEBRIDEMENT BILATERAL ARMS;  Surgeon: Knute Neu, MD;  [facility]: [facility];  Service: Plastics;  Laterality: Bilateral;   INCISION AND DRAINAGE OF WOUND Right 12/14/2021   Procedure: IRRIGATION AND DEBRIDEMENT RIGHT ARM;  Surgeon: [name], MD;  Location: [facility];  Service: Plastics;  Laterality: Right;   INCISION AND DRAINAGE OF WOUND Right 12/22/2021   Procedure: IRRIGATION AND DEBRIDEMENT ARM  WOUND;  Surgeon: [name], MD;  Location: [facility];  Service: Plastics;  Laterality: Right;  1 hour   INCISION AND DRAINAGE OF WOUND Right 12/29/2021   Procedure: DEBRIDEMENT RIGHT ARM  WOUND;  Surgeon: [name], MD;  Location: [facility];  Service: Plastics;  Laterality: Right;   JOINT REPLACEMENT     SCLEROTHERAPY  11/17/2021    Procedure: Susa Day;  Surgeon: Vida Rigger, MD;  Location: [facility];  Service: Endoscopy;;   SKIN SPLIT GRAFT Right 01/03/2022   Procedure: SKIN GRAFT SPLIT THICKNESS;  Surgeon: [name], MD;  Location: [facility];  Service: Plastics;  Laterality: Right;   TEE WITHOUT CARDIOVERSION N/A 09/30/2021   Procedure: TRANSESOPHAGEAL ECHOCARDIOGRAM (TEE);  Surgeon: Orpah Cobb, MD;  Location: [facility] ENDOSCOPY;  Service: Cardiovascular;  Laterality: N/A;   Family History: History reviewed. No pertinent family history. Family Psychiatric  History: Older brother committed suicide  Social History:  Social History   Substance and Sexual Activity  Alcohol Use Yes   Comment: occasionally     Social History   Substance and Sexual Activity  Drug Use Yes   Types: IV, Marijuana   Comment: heroin, meth    Social History   Socioeconomic History   Marital status: Single    Spouse name: Not on file   Number of children: Not on file   Years of education: Not on file   Highest education level: Not on file  Occupational History   Not on file  Tobacco Use   Smoking status: Some Days    Current packs/day: 0.50    Types: Cigarettes   Smokeless tobacco: Never  Substance and Sexual Activity   Alcohol use: Yes    Comment: occasionally   Drug use: Yes    Types: IV, Marijuana    Comment: heroin, meth   Sexual activity: Not on file  Other Topics Concern   Not on file  Social History Narrative   ** Merged History Encounter **       Social Determinants of Health   Financial Resource Strain: Low Risk  (03/29/2021)   [facility]   Overall Financial Resource Strain (CARDIA)    Difficulty of Paying Living Expenses: Not very hard  Food Insecurity: Food Insecurity Present (03/15/2023)   Hunger Vital Sign    Worried About Running Out of Food in the Last Year: Sometimes true    Ran Out of Food in the Last Year: Sometimes true  Transportation Needs: Unmet  Transportation Needs (03/15/2023)   PRAPARE - Administrator, Civil Service (Medical): Yes    Lack of Transportation (Non-Medical): Yes  Physical Activity: Not on file  Stress: Not on file  Social Connections: Not on file   Additional Social History: Patient stays with friends "here and there" or at the [facility]    Allergies:   Allergies  Allergen Reactions   Bee Venom Anaphylaxis, Swelling and  Other (See Comments)    Swelling all over   Acetaminophen Rash and Other (See Comments)    Elevated liver enzymes, also   Succinylcholine Other (See Comments)    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 Had prolonged block (>30 min) after succinylcholine for GI procedure.    Chlorhexidine Other (See Comments)    Pt does not remember reaction     Labs:  Results for orders placed or performed during the hospital encounter of 08/10/23 (from the past 48 hour(s))  Rapid urine drug screen (hospital performed)     Status: Abnormal   Collection Time: 08/10/23  6:27 AM  Result Value Ref Range   Opiates NONE DETECTED NONE DETECTED   Cocaine POSITIVE (A) NONE DETECTED   Benzodiazepines NONE DETECTED NONE DETECTED   Amphetamines NONE DETECTED NONE DETECTED   Tetrahydrocannabinol POSITIVE (A) NONE DETECTED   Barbiturates NONE DETECTED NONE DETECTED    Comment: (NOTE) DRUG SCREEN FOR MEDICAL PURPOSES ONLY.  IF CONFIRMATION IS NEEDED FOR ANY PURPOSE, NOTIFY LAB WITHIN 5 DAYS.  LOWEST DETECTABLE LIMITS FOR URINE DRUG SCREEN Drug Class                     Cutoff (ng/mL) Amphetamine and metabolites    1000 Barbiturate and metabolites    200 Benzodiazepine                 200 Opiates and metabolites        300 Cocaine and metabolites        300 THC                            50 Performed at [facility], [address], [place], [place] [address]     No current facility-administered medications for this encounter.   Current Outpatient Medications  Medication Sig Dispense  Refill   albuterol (VENTOLIN HFA) 108 (90 Base) MCG/ACT inhaler Inhale 2 puffs into the lungs every 6 (six) hours as needed for wheezing or shortness of breath.     fluticasone (FLONASE ALLERGY RELIEF) 50 MCG/ACT nasal spray Place 1 spray into both nostrils in the morning and at bedtime.     gabapentin (NEURONTIN) 300 MG capsule Take 1 capsule (300 mg total) by mouth 3 (three) times daily. 90 capsule 0   hydrOXYzine (ATARAX) 50 MG tablet Take 1 tablet (50 mg total) by mouth every 6 (six) hours as needed for anxiety. 30 tablet 0   naloxone (NARCAN) nasal spray 4 mg/0.1 mL Take as needed for Narcotic overdose. (Patient not taking: Reported on 03/14/2023) 1 each 0   nicotine (NICODERM CQ - DOSED IN MG/24 HOURS) 21 mg/24hr patch Place 1 patch (21 mg total) onto the skin daily. 28 patch 0   OPCON-A 0.027-0.315 % SOLN Place 1 drop into both eyes 3 (three) times daily as needed (for itching).     QUEtiapine (SEROQUEL XR) 200 MG 24 hr tablet Take 1 tablet (200 mg total) by mouth at bedtime. Take 250 mg total by mouth nightly at bedtime (200 mg tablet and 50 mg tablet) 30 tablet 0   QUEtiapine (SEROQUEL XR) 50 MG TB24 24 hr tablet Take 1 tablet (50 mg total) by mouth at bedtime. Take 250 mg total by mouth nightly at bedtime (200 mg tablet and 50 mg tablet) 30 tablet 0   ZYRTEC ALLERGY 10 MG tablet Take 10 mg by mouth at bedtime.      Musculoskeletal: Strength &  Muscle Tone: within normal limits Gait & Station: normal Patient leans: N/A   Psychiatric Specialty Exam: Presentation  General Appearance:  Disheveled  Eye Contact: Fleeting  Speech: Garbled  Speech Volume: Normal  Handedness: Right   Mood and Affect  Mood: Hopeless; Depressed  Affect: Congruent   Thought Process  Thought Processes: Coherent  Descriptions of Associations:Intact  Orientation:Full (Time, Place and Person)  Thought Content:WDL  History of Schizophrenia/Schizoaffective disorder:Yes  Duration of  Psychotic Symptoms:Less than six months  Hallucinations:Hallucinations: Auditory; Visual Description of Auditory Hallucinations: Voices that say "you're a piece of shit" Description of Visual Hallucinations: "I see all kinds of shit"  Ideas of Reference:None  Suicidal Thoughts:Suicidal Thoughts: Yes, Active SI Active Intent and/or Plan: With Intent; With Plan  Homicidal Thoughts:Homicidal Thoughts: No   Sensorium  Memory: Immediate Fair; Recent Fair; Remote Fair  Judgment: Poor  Insight: Lacking   Executive Functions  Concentration: Fair  Attention Span: Fair  Recall: [employer] of Knowledge: Fair  Language: Fair   Psychomotor Activity  Psychomotor Activity: Psychomotor Activity: Restlessness   Assets  Assets: Desire for Improvement; Social Support    Sleep  Sleep: Sleep: Poor Number of Hours of Sleep: 0 (He says he can't say how much he is sleeping)   Physical Exam: Physical Exam Vitals and nursing note reviewed.  Eyes:     Pupils: Pupils are equal, round, and reactive to light.  Pulmonary:     Effort: Pulmonary effort is normal.  Neurological:     Mental Status: He is alert and oriented to person, place, and time.    Review of Systems  Psychiatric/Behavioral:  Positive for substance abuse and suicidal ideas.   All other systems reviewed and are negative.  Blood pressure 130/80, pulse 84, temperature 97.7 F (36.5 C), temperature source Oral, resp. rate 15, height 5\' 5"  (1.651 m), weight 63.5 kg, SpO2 98%. Body mass index is 23.3 kg/m.  Medical Decision Making: Patient case reviewed and discussed with Dr Lucianne Muss. Patient is a danger to himself and is experiencing psychotic symptoms.  He requires inpatient psychiatric hospitalization for stabilization and treatment.  Problem 1: Suicidal ideation -Recommend inpatient psychiatric hospitalization for stabilization and treatment. -Zyprexa 5mg  PO Q HS   Disposition:  Recommend inpatient  psychiatric hospitalization for stabilization and treatment.  Thomes Lolling, NP 08/10/2023 1:58 PM

## 2023-08-11 NOTE — ED Notes (Signed)
Voluntary pt
